# Patient Record
Sex: Female | Born: 1950 | Race: White | Hispanic: No | Smoking: Current every day smoker
Health system: Southern US, Community
[De-identification: ages and names within clinical notes are randomized; demographics above are authoritative.]

## PROBLEM LIST (undated history)

## (undated) DIAGNOSIS — J181 Lobar pneumonia, unspecified organism: Secondary | ICD-10-CM

## (undated) DIAGNOSIS — A419 Sepsis, unspecified organism: Secondary | ICD-10-CM

## (undated) DIAGNOSIS — E559 Vitamin D deficiency, unspecified: Secondary | ICD-10-CM

## (undated) DIAGNOSIS — Z720 Tobacco use: Secondary | ICD-10-CM

## (undated) DIAGNOSIS — J90 Pleural effusion, not elsewhere classified: Secondary | ICD-10-CM

## (undated) DIAGNOSIS — N390 Urinary tract infection, site not specified: Secondary | ICD-10-CM

## (undated) DIAGNOSIS — R7989 Other specified abnormal findings of blood chemistry: Secondary | ICD-10-CM

## (undated) DIAGNOSIS — K5792 Diverticulitis of intestine, part unspecified, without perforation or abscess without bleeding: Secondary | ICD-10-CM

## (undated) DIAGNOSIS — N179 Acute kidney failure, unspecified: Secondary | ICD-10-CM

## (undated) DIAGNOSIS — J9601 Acute respiratory failure with hypoxia: Secondary | ICD-10-CM

## (undated) DIAGNOSIS — F1010 Alcohol abuse, uncomplicated: Secondary | ICD-10-CM

## (undated) DIAGNOSIS — T7840XA Allergy, unspecified, initial encounter: Secondary | ICD-10-CM

## (undated) DIAGNOSIS — I10 Essential (primary) hypertension: Secondary | ICD-10-CM

## (undated) DIAGNOSIS — I471 Supraventricular tachycardia, unspecified: Secondary | ICD-10-CM

## (undated) DIAGNOSIS — H269 Unspecified cataract: Secondary | ICD-10-CM

## (undated) HISTORY — DX: Lobar pneumonia, unspecified organism: J18.1

## (undated) HISTORY — PX: CATARACT EXTRACTION: SUR2

## (undated) HISTORY — PX: ABDOMINAL HYSTERECTOMY: SHX81

## (undated) HISTORY — DX: Other specified abnormal findings of blood chemistry: R79.89

## (undated) HISTORY — DX: Sepsis, unspecified organism: A41.9

## (undated) HISTORY — DX: Diverticulitis of intestine, part unspecified, without perforation or abscess without bleeding: K57.92

## (undated) HISTORY — DX: Pleural effusion, not elsewhere classified: J90

## (undated) HISTORY — DX: Unspecified cataract: H26.9

## (undated) HISTORY — DX: Essential (primary) hypertension: I10

## (undated) HISTORY — DX: Vitamin D deficiency, unspecified: E55.9

## (undated) HISTORY — PX: FRACTURE SURGERY: SHX138

## (undated) HISTORY — DX: Acute kidney failure, unspecified: N17.9

## (undated) HISTORY — DX: Allergy, unspecified, initial encounter: T78.40XA

## (undated) HISTORY — DX: Acute respiratory failure with hypoxia: J96.01

## (undated) HISTORY — DX: Urinary tract infection, site not specified: N39.0

---

## 2004-10-01 ENCOUNTER — Inpatient Hospital Stay (HOSPITAL_COMMUNITY): Admission: EM | Admit: 2004-10-01 | Discharge: 2004-10-05 | Payer: Self-pay | Admitting: Emergency Medicine

## 2004-10-01 ENCOUNTER — Ambulatory Visit: Payer: Self-pay | Admitting: Physical Medicine & Rehabilitation

## 2004-10-05 ENCOUNTER — Inpatient Hospital Stay (HOSPITAL_COMMUNITY)
Admission: RE | Admit: 2004-10-05 | Discharge: 2004-10-11 | Payer: Self-pay | Admitting: Physical Medicine & Rehabilitation

## 2004-10-05 ENCOUNTER — Ambulatory Visit: Payer: Self-pay | Admitting: Physical Medicine & Rehabilitation

## 2005-12-28 ENCOUNTER — Emergency Department (HOSPITAL_COMMUNITY): Admission: EM | Admit: 2005-12-28 | Discharge: 2005-12-28 | Payer: Self-pay | Admitting: Emergency Medicine

## 2006-03-08 ENCOUNTER — Inpatient Hospital Stay (HOSPITAL_COMMUNITY): Admission: EM | Admit: 2006-03-08 | Discharge: 2006-03-13 | Payer: Self-pay | Admitting: *Deleted

## 2008-01-24 ENCOUNTER — Emergency Department (HOSPITAL_COMMUNITY): Admission: EM | Admit: 2008-01-24 | Discharge: 2008-01-25 | Payer: Self-pay | Admitting: Emergency Medicine

## 2009-02-25 ENCOUNTER — Ambulatory Visit: Payer: Self-pay | Admitting: Internal Medicine

## 2010-04-19 ENCOUNTER — Ambulatory Visit: Payer: Self-pay | Admitting: Ophthalmology

## 2010-05-10 ENCOUNTER — Ambulatory Visit: Payer: Self-pay | Admitting: Ophthalmology
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
## 2010-09-22 NOTE — H&P (Signed)
Erica Ball, MYSLIWIEC NO.:  [id number]   MEDICAL RECORD NO.:  [id number]          PATIENT TYPE:  INP   LOCATION:  4735                         FACILITY:  [facility]   PHYSICIAN:  Jackie Plum, M.D.DATE OF BIRTH:  [birth year]   DATE OF ADMISSION:  03/08/2006  DATE OF DISCHARGE:                                HISTORY & PHYSICAL   CHIEF COMPLAINT:  Nausea and vomiting.   HISTORY OF PRESENT ILLNESS:  The patient is a 60 year old Caucasian lady who  presented with a 2-week history of nausea which has been progressively  worsening with difficulty keeping any food down.  She has been vomiting  almost anything that she takes over the last week or so.  She had 2 episodes  of diarrhea 2 days ago.  On account of this problem, she went to see her  primary care physician, whereupon she was noted to be hypotensive.  She was  referred to the ED for further evaluation and management for suspected  dehydration.  In the emergency room, the patient was noted to be severely  hypokalemic and hyponatremic and the hospitalist service was asked to  evaluate for admission.  The patient gives a history of generalized  weakness.  She does not have any fever or chills.  No constipation.  No  dysuria, frequency or micturition.  She was said to be a little bit  lethargic initially at the emergency room.  She gives history of  lightheadedness, especially on standing up, no history of syncopal episode.   PAST MEDICAL HISTORY:  1. Positive for history of hypertension.  2. She also has a history of about 80-pack-year history of cigarette      smoking.  3. She has a history of Colles fracture in August 2007.  4. History of other orthopedic issues.  5. She is status post hysterectomy and tonsillectomy.   FAMILY HISTORY:  Positive for history of hypertension.   ALLERGIES:  The patient does not have any medication allergies.   MEDICATIONS:  She is on Vicodin for pain control and she was on  atenolol,  hydrochlorothiazide and lisinopril, all of which were discontinued by her  doctor on date of admission on account of low blood pressure at the doctor's  office.   SOCIAL HISTORY:  The patient is single.  She smokes 2 packs of cigarettes  daily for about 40 years.  She has 1 older son who lives somewhere near  [place], [place].  She drinks about a 6-pack of beer daily.   REVIEW OF SYSTEMS:  As noted above and otherwise unremarkable.   PHYSICAL EXAMINATION:  VITAL SIGNS:  BP was 97/76 at the ED initially, but  it came up to 105/69.  Pulse 85.  Respirations 18.  Temperature 97.4 degrees  Fahrenheit.  O2 saturation was 97% on room air.  GENERAL:  The patient was generally weak-looking, but she was not in acute  cardiopulmonary distress.  HEENT:  Normocephalic, atraumatic.  Pupils were equal, round and reactive to  light.  Extraocular movements intact.  Oropharynx was slightly dry.  No  exudation or erythema noted.  NECK:  Supple with no JVD.  LUNGS:  Vesicular breath sounds, no crackles or wheezes.  CARDIAC:  Regular rate and rhythm.  No gallops or murmur.  ABDOMEN:  Soft and nontender.  Bowel sounds present.  EXTREMITIES:  No cyanosis.  No edema.  The patient had an orthopedic cast on  the left forearm.   LABORATORY WORK:  Twelve-lead EKG shows sinus bradycardia at 59 beats per  minute.  There was a prolonged Q-T interval.  There was no __________  of  any ischemic changes.   X-ray of the chest showed hyperinflation with airway thickening suspicious  for possible COPD.   WBC 6.7, hemoglobin 15.6, hematocrit 43.9, MCV 105.6, platelet count  181,000.  Sodium 114, potassium less than 2, chloride less than 70, CO2 29,  glucose 125, BUN 6, creatinine 0.9, total bilirubin 0.8, alkaline  phosphatase 77, AST 74, ALT 99.  Total protein 6.4, albumin 3.8, calcium  9.0.   IMPRESSIONS:  1. Severe electrolyte abnormality with severe hypokalemia and      hyponatremia.  She  has had loss of potassium from gastrointestinal      tract with nausea and vomiting and decreased intake of potassium.  2. Alcohol abuse.  By history, the patient's last intake of alcohol was      less than a week ago, according to the patient.  3. Continued cigarette smoking with about a 80-pack-year history.  4. Transaminitis in a patient with alcohol abuse, rule out alcoholic liver      disease.  5. Mild dehydration from decreased oral intake as well as gastrointestinal      loss of fluids from nausea and vomiting.  The patient is admitted for      aggressive potassium repletion.  She has an abnormal EKG with prolonged      Q-T interval due to the hypokalemia and we will check a TSH level and      urine studies, that is, urine electrolytes, to further evaluate her      hyponatremia.  The patient denies any prior history of hyponatremia.      This may also be related to her cigarette smoking with syndrome of      inappropriate antidiuretic hormone.  She may also have chronic      obstructive pulmonary disease which in turn may also cause syndrome of      inappropriate antidiuretic hormone.  We will repeat potassium level.      We will start her on saline infusion.  We will give her some      antiemetics and antinauseants for symptom control.  CT scan of the head      has been order and is also pending and will need to be followed up.  6. The patient will be admitted to a telemetry bed for monitoring and      repeat EKG in the morning.  Further recommendations will be made as      data base responds.      Jackie Plum, M.D.  Electronically Signed     GO/MEDQ  D:  03/09/2006  T:  03/09/2006  Job:  [id number]
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
## 2010-09-22 NOTE — Discharge Summary (Signed)
NAME:  Erica Ball, Erica Ball             ACCOUNT NO.:  [id number]   MEDICAL RECORD NO.:  [id number]          PATIENT TYPE:  INP   LOCATION:  4735                         FACILITY:  [facility]   PHYSICIAN:  Erica Ball, MDDATE OF BIRTH:  [birth year]   DATE OF ADMISSION:  03/08/2006  DATE OF DISCHARGE:  03/13/2006                                 DISCHARGE SUMMARY   DISCHARGE DIAGNOSES:  1. Vomiting, resolved. Suspect alcoholic gastritis.  2. Hypokalemia secondary to vomiting, alcohol, and hydrochlorothiazide.  3. Hyponatremia secondary to vomiting and alcohol.  4. History of hypertension.  5. Tobacco abuse, counseled against.  6. Deconditioning.  7. Alcohol abuse.   DISCHARGE MEDICATIONS:  1. She is to stop her atenolol, hydrochlorothiazide, and lisinopril.  2. She is to take Prilosec 40 mg a day.  3. Phenergan 12.5 mg p.o. q.6 h. p.r.n. nausea.  4. Continue Vicodin as needed.  Diet regular.  She is encouraged to      abstain from alcohol and  cigarettes.   CONDITION:  Stable.   ACTIVITY:  She is being sent home with home physical therapy.   FOLLOWUP:  With primary care physician in a week, at which time blood  pressure and a basic metabolic panel should be checked.   CONSULTATIONS:  None.   PROCEDURES:  None.   PERTINENT LABORATORY DATA:  CBC on admission was essentially unremarkable,  except for elevated hemoglobin and hematocrit, and an MCV of 105.  Sodium on  admission was 114, at discharge is 131. Potassium on admission was less than  2, at discharge is 3.6.  Liver function tests significant for an AST of 74,  ALT of 99.  Serum osmolarity was low at 233.  Urine osmolarity 169.  TSH  1.031.  Urine sodium 13.   SPECIAL STUDIES AND RADIOLOGY:  EKG showed sinus bradycardia, with a rate of  59, ST and T-wave abnormalities inferolaterally, prolonged QT. Abdominal  ultrasound was normal.   HISTORY AND HOSPITAL COURSE:  Erica Ball is a 60 year old white female  patient of  [facility] would been seen by Celso Amy and Dr.  Joycelyn Ball the day of admission. She had been vomiting on and off for a  week or two.  She also had one to two episodes of diarrhea.  She admits to  drinking a six-pack of beer a day and is usually on several medications for  hypertension. She was noted to be hypotensive in the office and was sent to  the emergency room. She was found to be profoundly hyponatremic and  hypokalemic.  Her blood pressure in the emergency room was marginal, in the  low 90s, but apparently she dropped to 78 briefly on the floor.  She was  given saline and potassium repletion as well as antiemetics.  Her vomiting  improved, and at the time of discharge she was tolerating a diet.  Her  antihypertensives of course were held, and I have instructed her not to take  them. She may eventually be able to go back on them when her blood pressure  comes, but I would avoid the  hydrochlorothiazide, given her hypokalemia.  I  suspect most much of her vomiting and electrolyte abnormalities are alcohol  related, and I have asked her to abstain from alcohol.  She had a physical  therapy consult while here was who recommended home physical therapy, which  I have ordered.  I suspect she has an element of gastritis, and I have  placed her on a proton pump inhibitor empirically.      Erica L. Lendell Caprice, MD  Electronically Signed     CLS/MEDQ  D:  03/13/2006  T:  03/13/2006  Job:  [id number]   cc:   Erica Ball, M.D.

## 2010-09-22 NOTE — Consult Note (Signed)
NAMEMURRAY, GUZZETTA NO.:  [id number]   MEDICAL RECORD NO.:  [id number]          PATIENT TYPE:  EMS   LOCATION:  MAJO                         FACILITY:  [facility]   PHYSICIAN:  Doralee Albino. Carola Frost, M.D. DATE OF BIRTH:  12-Oct-1950   DATE OF CONSULTATION:  12/28/2005  DATE OF DISCHARGE:  12/28/2005                                   CONSULTATION   REASON FOR CONSULTATION:  Left wrist pain and deformity.   REQUESTING PHYSICIAN:  Lorre Nick, M.D.   BRIEF HISTORY OF PRESENTATION:  Erica Ball is a 60 year old right-hand-  dominant female previously known to me for an ankle fracture.  She sustained  a ground-level fall onto her left wrist approximately 2 days ago and  sustained pain and deformity.  She subsequently presented when her symptoms  did not resolve.  She denies any numbness or tingling in her fingers and  denies other injury.  There was no break in the skin and bleeding.   PAST MEDICAL HISTORY:  Is notable for longstanding tobacco abuse, smoking a  pack and a half daily.  She also has past medical history notable for  alcohol abuse and the patient has continues to drink regularly.  She works  as a Catering manager.  She has also sustained previous fractures including the  ankle status post ORIF as well as closed treatment of a right wrist  fracture.   MEDICATIONS:  None.   ALLERGIES:  None.   SOCIAL HISTORY:  As above.  The patient presents with her sister.   PHYSICAL EXAMINATION:  She appears older then stated age.  She is not in any  acute distress.  She is overall thin appearance.  The left upper extremity  is notable for intact radial, median and ulnar sensation.  Radial pulses 2+.  She does have a wrist deformity and isolated tenderness.  No elbow  tenderness or discomfort.   X-RAYS:  AP and lateral films of the wrist and hand demonstrate a comminuted  extra-articular distal radius fracture with greater than a centimeter  shortening.   ASSESSMENT:   Colles fracture left wrist in a patient with pack and half a  smoking history and other previous fractures.   PLAN:  I discussed with Erica Ball the markers on her current x-rays that  make it likely she will have a poor result with nonoperative management, and  that it is likely we will be unable to obtain and maintain a near-anatomic  reduction.  This could result in deformity and decreased function both in  the near and the long-term.  She understood this quite clearly and in spite  of that did not wish to undergo operative treatment.  She understood that  operative treatment, though, would lead to better overall alignment, did  carry of risk of infection, and that with either treatment there is a  possibility this may take a long period of time to heal.  We did discuss  smoking cessation and reduction as well and she was not interested in that.   INTERVENTION:  After discussion of a hematoma block and manipulation, the  patient  wished to proceed.  This was performed in standard fashion using 8  mL of a mixture of lidocaine and Marcaine.  A well-molded sugar-tong splint  was applied and then postreduction x-rays obtained.  These show improvement  in her overall alignment without restoration of appropriate tilt.  There was  improvement in the length.   PLAN:  Ms. Scherzinger will need to be followed carefully to re-manipulate as  necessary to obtain the best possible result with closed reduction.  However, given the initial comminution, poor bone quality and excessive  shortening on presentation, it is unlikely that we will be able to obtain or  maintain a great result.  It is certainly her choice and will do everything  possible to get the best possible outcome given the given the parameters  allowed for treatment.  She was placed into a sling and will return to  clinic for AP and lateral x-rays of the left wrist within 10 days.      Doralee Albino. Carola Frost, M.D.  Electronically  Signed     MHH/MEDQ  D:  12/31/2005  T:  12/31/2005  Job:  [id number]
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
## 2011-02-05 LAB — DIFFERENTIAL
Basophils Absolute: 0
Eosinophils Absolute: 0.2
Eosinophils Relative: 1
Lymphocytes Relative: 6 — ABNORMAL LOW
Monocytes Absolute: 0.5

## 2011-02-05 LAB — CBC
MCV: 108 — ABNORMAL HIGH
Platelets: 365
RBC: 4.8
WBC: 13.3 — ABNORMAL HIGH

## 2011-02-05 LAB — COMPREHENSIVE METABOLIC PANEL
ALT: 14
AST: 21
Albumin: 4.4
Chloride: 96
Creatinine, Ser: 0.85
GFR calc Af Amer: >60
Potassium: 2.8 — ABNORMAL LOW
Sodium: 140
Total Bilirubin: 0.6

## 2011-02-05 LAB — URINALYSIS, ROUTINE W REFLEX MICROSCOPIC
Bilirubin Urine: NEGATIVE
Glucose, UA: NEGATIVE
Ketones, ur: NEGATIVE
Nitrite: NEGATIVE
pH: 7

## 2011-02-05 LAB — URINE MICROSCOPIC-ADD ON

## 2011-10-11 ENCOUNTER — Ambulatory Visit: Payer: BC Managed Care – PPO

## 2011-10-11 ENCOUNTER — Ambulatory Visit (INDEPENDENT_AMBULATORY_CARE_PROVIDER_SITE_OTHER): Payer: BC Managed Care – PPO | Admitting: Family Medicine

## 2011-10-11 VITALS — BP 215/114 | HR 94 | Temp 98.1°F | Resp 18 | Ht 67.0 in | Wt 167.4 lb

## 2011-10-11 DIAGNOSIS — M25569 Pain in unspecified knee: Secondary | ICD-10-CM

## 2011-10-11 DIAGNOSIS — M25559 Pain in unspecified hip: Secondary | ICD-10-CM

## 2011-10-11 DIAGNOSIS — W19XXXA Unspecified fall, initial encounter: Secondary | ICD-10-CM

## 2011-10-11 DIAGNOSIS — M25562 Pain in left knee: Secondary | ICD-10-CM

## 2011-10-11 DIAGNOSIS — R3915 Urgency of urination: Secondary | ICD-10-CM

## 2011-10-11 DIAGNOSIS — F1021 Alcohol dependence, in remission: Secondary | ICD-10-CM

## 2011-10-11 DIAGNOSIS — M25552 Pain in left hip: Secondary | ICD-10-CM

## 2011-10-11 DIAGNOSIS — I10 Essential (primary) hypertension: Secondary | ICD-10-CM

## 2011-10-11 DIAGNOSIS — F411 Generalized anxiety disorder: Secondary | ICD-10-CM

## 2011-10-11 DIAGNOSIS — R319 Hematuria, unspecified: Secondary | ICD-10-CM

## 2011-10-11 DIAGNOSIS — F419 Anxiety disorder, unspecified: Secondary | ICD-10-CM

## 2011-10-11 DIAGNOSIS — M810 Age-related osteoporosis without current pathological fracture: Secondary | ICD-10-CM

## 2011-10-11 LAB — POCT CBC
Granulocyte percent: 81.5 %G — AB (ref 37–80)
HCT, POC: 44.5 % (ref 37.7–47.9)
Hemoglobin: 14.9 g/dL (ref 12.2–16.2)
Lymph, poc: 1.7 (ref 0.6–3.4)
MCH, POC: 35.1 pg — AB (ref 27–31.2)
MCHC: 33.5 g/dL (ref 31.8–35.4)
MCV: 105 fL — AB (ref 80–97)
MID (cbc): 0.5 (ref 0–0.9)
MPV: 8.1 fL (ref 0–99.8)
POC Granulocyte: 9.3 — AB (ref 2–6.9)
POC LYMPH PERCENT: 14.5 %L (ref 10–50)
POC MID %: 4 %M (ref 0–12)
Platelet Count, POC: 294 10*3/uL (ref 142–424)
RBC: 4.24 M/uL (ref 4.04–5.48)
RDW, POC: 13.4 %
WBC: 11.4 10*3/uL — AB (ref 4.6–10.2)

## 2011-10-11 LAB — POCT URINALYSIS DIPSTICK
Bilirubin, UA: NEGATIVE
Glucose, UA: NEGATIVE
Nitrite, UA: NEGATIVE

## 2011-10-11 LAB — LIPID PANEL
Cholesterol: 167 mg/dL (ref 0–200)
HDL: 62 mg/dL (ref >39)
LDL Cholesterol: 87 mg/dL (ref 0–99)
Total CHOL/HDL Ratio: 2.7 Ratio
Triglycerides: 91 mg/dL (ref <150)
VLDL: 18 mg/dL (ref 0–40)

## 2011-10-11 LAB — COMPREHENSIVE METABOLIC PANEL
ALT: 13 U/L (ref 0–35)
AST: 14 U/L (ref 0–37)
Albumin: 4.5 g/dL (ref 3.5–5.2)
Alkaline Phosphatase: 67 U/L (ref 39–117)
BUN: 9 mg/dL (ref 6–23)
CO2: 29 mEq/L (ref 19–32)
Calcium: 9.4 mg/dL (ref 8.4–10.5)
Chloride: 100 mEq/L (ref 96–112)
Creat: 0.51 mg/dL (ref 0.50–1.10)
Glucose, Bld: 99 mg/dL (ref 70–99)
Potassium: 4.4 mEq/L (ref 3.5–5.3)
Sodium: 139 mEq/L (ref 135–145)
Total Bilirubin: 0.5 mg/dL (ref 0.3–1.2)
Total Protein: 6.9 g/dL (ref 6.0–8.3)

## 2011-10-11 LAB — POCT UA - MICROSCOPIC ONLY

## 2011-10-11 LAB — TSH: TSH: 1.867 u[IU]/mL (ref 0.350–4.500)

## 2011-10-11 MED ORDER — AMLODIPINE BESYLATE 10 MG PO TABS: 10.0000 mg | ORAL_TABLET | Freq: Every day | ORAL | Status: DC

## 2011-10-11 MED ORDER — LORAZEPAM 0.5 MG PO TABS: 0.5000 mg | ORAL_TABLET | Freq: Every day | ORAL | Status: AC | PRN

## 2011-10-11 MED ORDER — TRAMADOL HCL 50 MG PO TABS: 50.0000 mg | ORAL_TABLET | Freq: Three times a day (TID) | ORAL | Status: AC | PRN

## 2011-10-11 NOTE — Patient Instructions (Signed)
Home Safety and Preventing Falls Falls are a leading cause of injury and while they affect all age groups, falls have greater short-term and long-term impact on older age groups. However, falls should not be a part of life or aging. It is possible for individuals and their families to use preventive measures to significantly decrease the likelihood that anyone, especially an older adult, will fall. There are many simple measures which can make your home safer with respect to preventing falls. The following actions can help reduce falls among all members of your family and are especially important as you age, when your balance, lower limb strength, coordination, and eyesight may be declining. The use of preventive measures will help to reduce you and your family's risk of falls and serious medical consequences. OUTDOORS  Repair cracks and edges of walkways and driveways.   Remove high doorway thresholds and trim shrubbery on the main path into your home.   Ensure there is good outside lighting at main entrances and along main walkways.   Clear walkways of tools, rocks, debris, and clutter.   Check that handrails are not broken and are securely fastened. Both sides of steps should have handrails.   In the garage, be attentive to and clean up grease or oil spills on the cement. This can make the surface extremely slippery.   In winter, have leaves, snow, and ice cleared regularly.   Use sand or salt on walkways during winter months.  BATHROOM  Install grab bars by the toilet and in the tub and shower.   Use non-skid mats or decals in the tub or shower.   If unable to easily stand unsupported while showering, place a plastic non slip stool in the shower to sit on when needed.   Install night lights.   Keep floors dry and clean up all water on the floor immediately.   Remove soap buildup in tub or shower on a regular basis.   Secure bath mats with non-slip, double-sided rug tape.    Remove tripping hazards from the floors.  BEDROOMS  Install night lights.   Do not use oversized bedding.   Make sure a bedside light is easy to reach.   Keep a telephone by your bedside.   Make sure that you can get in and out of your bed easily.   Have a firm chair, with side arms, to use for getting dressed.   Remove clutter from around closets.   Store clothing, bed coverings, and other household items where you can reach them comfortably.   Remove tripping hazards from the floor.  LIVING AREAS AND STAIRWAYS  Turn on lights to avoid having to walk through dark areas.   Keep lighting uniform in each room. Place brighter lightbulbs in darker areas, including stairways.   Replace lightbulbs that burn out in stairways immediately.   Arrange furniture to provide for clear pathways.   Keep furniture in the same place.   Eliminate or tape down electrical cables in high traffic areas.   Place handrails on both sides of stairways. Use handrails when going up or down stairs.   Most falls occur on the top or bottom 3 steps.   Fix any loose handrails. Make sure handrails on both sides of the stairways are as long as the stairs.   Remove all walkway obstacles.   Coil or tape electrical cords off to the side of walking areas and out of the way. If using many extension cords, have an electrician   put in a new wall outlet to reduce or eliminate them.   Make sure spills are cleaned up quickly and allow time for drying before walking on freshly cleaned floors.   Firmly attach carpet with non-skid or two-sided tape.   Keep frequently used items within easy reach.   Remove tripping hazards such as throw rugs and clutter in walkways. Never leave objects on stairs.   Get rid of throw rugs elsewhere if possible.   Eliminate uneven floor surfaces.   Make sure couches and chairs are easy to get into and out of.   Check carpeting to make sure it is firmly attached along stairs.    Make repairs to worn or loose carpet promptly.   Select a carpet pattern that does not visually hide the edge of steps.   Avoid placing throw rugs or scatter rugs at the top or bottom of stairways, or properly secure with carpet tape to prevent slippage.   Have an electrician put in a light switch at the top and bottom of the stairs.   Get light switches that glow.   Avoid the following practices: hurrying, inattention, obscured vision, carrying large loads, and wearing slip-on shoes.   Be aware of all pets.  KITCHEN  Place items that are used frequently, such as dishes and food, within easy reach.   Keep handles on pots and pans toward the center of the stove. Use back burners when possible.   Make sure spills are cleaned up quickly and allow time for drying.   Avoid walking on wet floors.   Avoid hot utensils and knives.   Position shelves so they are not too high or low.   Place commonly used objects within easy reach.   If necessary, use a sturdy step stool with a grab bar when reaching.   Make sure electrical cables are out of the way.   Do not use floor polish or wax that makes floors slippery.  OTHER HOME FALL PREVENTION STRATEGIES  Wear low heel or rubber sole shoes that are supportive and fit well.   Wear closed toe shoes.   Know and watch for side effects of medications. Have your caregiver or pharmacist look at all your medicines, even over-the-counter medicines. Some medicines can make you sleepy or dizzy.   Exercise regularly. Exercise makes you stronger and improves your balance and coordination.   Limit use of alcohol.   Use eyeglasses if necessary and keep them clean. Have your vision checked every year.   Organize your household in a manner that minimizes the need to walk distances when hurried, or go up and down stairs unnecessarily. For example, have a phone placed on at least each floor of your home. If possible, have a phone beside each sitting  or lying area where you spend the most time at home. Keep emergency numbers posted at all phones.   Use non-skid floor wax.   When using a ladder, make sure:   The base is firm.   All ladder feet are on level ground.   The ladder is angled against the wall properly.   When climbing a ladder, face the ladder and hold the ladder rungs firmly.   If reaching, always keep your hips and body weight centered between the rails.   When using a stepladder, make sure it is fully opened and both spreaders are firmly locked.   Do not climb a closed stepladder.   Avoid climbing beyond the second step from the top   of a stepladder and the 4th rung from the top of an extension ladder.   Learn and use mobility aids as needed.   Change positions slowly. Arise slowly from sitting and lying positions. Sit on the edge of your bed before getting to your feet.   If you have a history of falls, ask someone to add color or contrast paint or tape to grab bars and handrails in your home.   If you have a history of falls, ask someone to place contrasting color strips on first and last steps.   Install an electrical emergency response system if you need one, and know how to use it.   If you have a medical or other condition that causes you to have limited physical strength, it is important that you reach out to family and friends for occasional help.  FOR CHILDREN:  If young children are in the home, use safety gates. At the top of stairs use screw-mounted gates; use pressure-mounted gates for the bottom of the stairs and doorways between rooms.   Young children should be taught to descend stairs on their stomachs, feet first, and later using the handrail.   Keep drawers fully closed to prevent them from being climbed on or pulled out entirely.   Move chairs, cribs, beds and other furniture away from windows.   Consider installing window guards on windows ground floor and up, unless they are emergency  fire exits. Make sure they have easy release mechanisms.   Consider installing special locks that only allow the window to be opened to a certain height.   Never rely on window screens to prevent falls.   Never leave babies alone on changing tables, beds or sofas. Use a changing table that has a restraining strap.   When a child can pull to a standing position, the crib mattress should be adjusted to its lowest position. There should be at least 26 inches between the top rails of the crib drop side and the mattress. Toys, bumper pads, and other objects that can be used as steps to climb out should be removed from the crib.   On bunk beds never allow a child under age 6 to sleep on the top bunk. For older children, if the upper bunk is not against a wall, use guard rails on both sides. No matter how old a child is, keep the guard rails in place on the top bunk since children roll during sleep. Do not permit horseplay on bunks.   Grass and soil surfaces beneath backyard playground equipment should be replaced with hardwood chips, shredded wood mulch, sand, pea gravel, rubber, crushed stone, or another safer material at depths of at least 9 to 12 inches.   When riding bikes or using skates, skateboards, skis, or snowboards, require children to wear helmets. Look for those that have stickers stating that they meet or exceed safety standards.   Vertical posts or pickets in deck, balcony, and stairway railings should be no more than 3 1/2 inches apart if a young baby will have access to the area. The space between horizontal rails or bars, and between the floor and the first horizontal rail or bar, should be no more than 3 1/2 inches.  Document Released: 04/13/2002 Document Revised: 04/12/2011 Document Reviewed: 02/10/2009 [employer] Patient Information 2012 [employer]. 
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
## 2011-10-12 ENCOUNTER — Telehealth: Payer: Self-pay | Admitting: *Deleted

## 2011-10-12 ENCOUNTER — Other Ambulatory Visit: Payer: Self-pay | Admitting: Family Medicine

## 2011-10-12 LAB — VITAMIN D 25 HYDROXY (VIT D DEFICIENCY, FRACTURES): Vit D, 25-Hydroxy: 15 ng/mL — ABNORMAL LOW (ref 30–89)

## 2011-10-12 NOTE — Telephone Encounter (Signed)
Per Dr. Milus Glazier, told patient Vitamin D level low.  Dr prescribed Vitamin D 50,000 IU called patient, called rx to [facility], told patient to recheck vit D level end of summer.  Angie Kortez Murtagh, CMA.

## 2011-10-30 ENCOUNTER — Ambulatory Visit: Payer: BC Managed Care – PPO

## 2011-10-30 ENCOUNTER — Ambulatory Visit (INDEPENDENT_AMBULATORY_CARE_PROVIDER_SITE_OTHER): Payer: BC Managed Care – PPO | Admitting: Family Medicine

## 2011-10-30 VITALS — BP 154/88 | HR 97 | Temp 99.3°F | Resp 16 | Ht 66.5 in | Wt 166.0 lb

## 2011-10-30 DIAGNOSIS — I10 Essential (primary) hypertension: Secondary | ICD-10-CM | POA: Insufficient documentation

## 2011-10-30 DIAGNOSIS — E559 Vitamin D deficiency, unspecified: Secondary | ICD-10-CM

## 2011-10-30 DIAGNOSIS — R05 Cough: Secondary | ICD-10-CM

## 2011-10-30 DIAGNOSIS — R059 Cough, unspecified: Secondary | ICD-10-CM

## 2011-10-30 DIAGNOSIS — K5792 Diverticulitis of intestine, part unspecified, without perforation or abscess without bleeding: Secondary | ICD-10-CM

## 2011-10-30 DIAGNOSIS — Z0000 Encounter for general adult medical examination without abnormal findings: Secondary | ICD-10-CM

## 2011-10-30 HISTORY — DX: Vitamin D deficiency, unspecified: E55.9

## 2011-10-30 HISTORY — DX: Diverticulitis of intestine, part unspecified, without perforation or abscess without bleeding: K57.92

## 2011-10-30 LAB — IFOBT (OCCULT BLOOD): IFOBT: POSITIVE

## 2011-10-30 NOTE — Progress Notes (Signed)
@[facility] @  Patient ID: Erica Ball MRN: [id number], DOB: 1950-06-11, 61 y.o. Date of Encounter: 10/30/2011, 3:06 PM  Primary Physician: No primary provider on file.  Chief Complaint: Physical (CPE)  HPI: 61 y.o. y/o female with history of noted below here for CPE.  Doing well. Complains of chronic cough. Never had zostavax LMP: years ago Pap:  Years ago MMG: 4-5 years ago TDap: unsure Colonoscopy:  2 years ago Review of Systems: Consitutional: No fever, chills, fatigue, night sweats, lymphadenopathy, or weight changes. Eyes: No visual changes, eye redness, or discharge. ENT/Mouth: Ears: No otalgia, tinnitus, hearing loss, discharge. Nose: No congestion, rhinorrhea, sinus pain, or epistaxis. Throat: No sore throat, post nasal drip, or teeth pain. Cardiovascular: No CP, palpitations, diaphoresis, DOE, edema, orthopnea, PND. Respiratory: No hemoptysis, SOB, or wheezing.  C/o persisting dry cough since starting norvasc Gastrointestinal: No anorexia, dysphagia, reflux, nausea, vomiting, hematemesis, diarrhea, constipation, BRBPR, or melena. She does note intermittent bilateral crampy abd pain she believes is diverticulitis Breast: No discharge, pain, swelling, or mass. Genitourinary: No dysuria, frequency, urgency, hematuria, incontinence, nocturia, amenorrhea, vaginal discharge, pruritis, burning, abnormal bleeding, or pain.s/p hysterectomy Musculoskeletal: No decreased ROM, myalgias, stiffness, joint swelling, or weakness. Skin: No rash, erythema, lesion changes, pain, warmth, jaundice, or pruritis. Neurological: No headache, dizziness, syncope, seizures, tremors, memory loss, coordination problems, or paresthesias. Psychological: No anxiety, depression, hallucinations, SI/HI. Endocrine: No fatigue, polydipsia, polyphagia, polyuria, or known diabetes. All other systems were reviewed and are otherwise negative.  Past Medical History  Diagnosis Date  . Hypertension   . Allergy      Past Surgical History  Procedure Date  . Fracture surgery   . Abdominal hysterectomy   . Cataract extraction     Home Meds:  Prior to Admission medications   Medication Sig Start Date End Date Taking? Authorizing Provider  amLODipine (NORVASC) 10 MG tablet Take 1 tablet (10 mg total) by mouth daily. 10/11/11 10/10/12 Yes Elvina Sidle, MD    Allergies: Not on File  History   Social History  . Marital Status: Divorced    Spouse Name: N/A    Number of Children: N/A  . Years of Education: N/A   Occupational History  . Not on file.   Social History Main Topics  . Smoking status: Current Everyday Smoker  . Smokeless tobacco: Not on file  . Alcohol Use: Not on file  . Drug Use: Not on file  . Sexually Active: Not on file   Other Topics Concern  . Not on file   Social History Narrative  . No narrative on file    Family History  Problem Relation Age of Onset  . Hypertension Mother   . Heart disease Mother   . Hypertension Father   . Heart disease Father   . Stroke Brother   . Hypertension Brother   . Heart disease Brother     Physical Exam: Blood pressure 154/88, pulse 97, temperature 99.3 F (37.4 C), temperature source Oral, resp. rate 16, height 5' 6.5" (1.689 m), weight 166 lb (75.297 kg)., Body mass index is 26.39 kg/(m^2). General: Well developed, well nourished, in no acute distress. HEENT: Normocephalic, atraumatic. Conjunctiva pink, sclera non-icteric. Pupils 2 mm constricting to 1 mm, round, regular, and equally reactive to light and accomodation. EOMI. Internal auditory canal clear. TMs with good cone of light and without pathology. Nasal mucosa pink. Nares are without discharge. No sinus tenderness. Oral mucosa pink. Dentition horrible-has phone number of dentist and she has insurance now. Pharynx without  exudate. Raspy voice  Neck: Supple. Trachea midline. No thyromegaly. Full ROM. No lymphadenopathy. Lungs: Clear to auscultation bilaterally without  wheezes, rales, or rhonchi. Breathing is of normal effort and unlabored. Cardiovascular: RRR with S1 S2. No murmurs, rubs, or gallops appreciated. Distal pulses 2+ symmetrically. No carotid or abdominal bruits. Breast: Symmetrical. No masses. Nipples without discharge. Abdomen: Soft, non-tender, non-distended with normoactive bowel sounds. No hepatosplenomegaly or masses. No rebound/guarding. No CVA tenderness. Without hernias. Rectal negative Genitourinary:  S/P hysterectomy Musculoskeletal: Full range of motion and 5/5 strength throughout. Without swelling, atrophy, tenderness, crepitus, or warmth. Extremities without clubbing, cyanosis. She does have 1+ bilateral foot edema Calves supple. Skin: Warm and moist without erythema, wounds, or rash. Fading ecchymosis left knee. Neuro: A+Ox3. CN II-XII grossly intact. Moves all extremities spontaneously. Full sensation throughout. Normal gait. DTR 2+ throughout upper and lower extremities. Finger to nose intact. Psych:  Responds to questions appropriately with a normal affect.   Studies:  Results for orders placed in visit on 10/11/11  POCT URINALYSIS DIPSTICK      Component Value Range   Color, UA yellow     Clarity, UA clear     Glucose, UA neg     Bilirubin, UA neg     Ketones, UA trace     Spec Grav, UA 1.010     Blood, UA moderate     pH, UA 6.5     Protein, UA neg     Urobilinogen, UA 0.2     Nitrite, UA neg     Leukocytes, UA Trace    POCT UA - MICROSCOPIC ONLY      Component Value Range   WBC, Ur, HPF, POC 0-1     RBC, urine, microscopic 0-4     Bacteria, U Microscopic trace     Mucus, UA neg     Epithelial cells, urine per micros 0-4     Crystals, Ur, HPF, POC neg     Casts, Ur, LPF, POC neg     Yeast, UA neg    VITAMIN D 25 HYDROXY      Component Value Range   Vit D, 25-Hydroxy 15 (*) 30 - 89 ng/mL  COMPREHENSIVE METABOLIC PANEL      Component Value Range   Sodium 139  135 - 145 mEq/L   Potassium 4.4  3.5 - 5.3 mEq/L     Chloride 100  96 - 112 mEq/L   CO2 29  19 - 32 mEq/L   Glucose, Bld 99  70 - 99 mg/dL   BUN 9  6 - 23 mg/dL   Creat 4.54  0.98 - 1.19 mg/dL   Total Bilirubin 0.5  0.3 - 1.2 mg/dL   Alkaline Phosphatase 67  39 - 117 U/L   AST 14  0 - 37 U/L   ALT 13  0 - 35 U/L   Total Protein 6.9  6.0 - 8.3 g/dL   Albumin 4.5  3.5 - 5.2 g/dL   Calcium 9.4  8.4 - 14.7 mg/dL  POCT CBC      Component Value Range   WBC 11.4 (*) 4.6 - 10.2 K/uL   Lymph, poc 1.7  0.6 - 3.4   POC LYMPH PERCENT 14.5  10 - 50 %L   MID (cbc) 0.5  0 - 0.9   POC MID % 4.0  0 - 12 %M   POC Granulocyte 9.3 (*) 2 - 6.9   Granulocyte percent 81.5 (*) 37 - 80 %G  RBC 4.24  4.04 - 5.48 M/uL   Hemoglobin 14.9  12.2 - 16.2 g/dL   HCT, POC 16.1  09.6 - 47.9 %   MCV 105.0 (*) 80 - 97 fL   MCH, POC 35.1 (*) 27 - 31.2 pg   MCHC 33.5  31.8 - 35.4 g/dL   RDW, POC 04.5     Platelet Count, POC 294  142 - 424 K/uL   MPV 8.1  0 - 99.8 fL  LIPID PANEL      Component Value Range   Cholesterol 167  0 - 200 mg/dL   Triglycerides 91  <409 mg/dL   HDL 62  >81 mg/dL   Total CHOL/HDL Ratio 2.7     VLDL 18  0 - 40 mg/dL   LDL Cholesterol 87  0 - 99 mg/dL  TSH      Component Value Range   TSH 1.867  0.350 - 4.500 uIU/mL   [facility] reading (PRIMARY) by  Dr. Milus Glazier: c/w COPD with flattened diaphragms. EKG:  Normal tracing  Assessment/Plan:  61 y.o. y/o female here for CPE.  She smokes, has poor dental condition, and generally appears to be in only health.  She has the chronic cough which she attributes to the Norvasc, but doesn't want to buy new medicine until she used this up. She does have diffuse abdominal tenderness without any mass, and I think this is a chronic problem - I have encouraged her to follow up with the dentist, to reduce or quit smoking, and get a Zostavax and TDaP (patient refused TDaP, pneumovax today)  Signed, Elvina Sidle, MD 10/30/2011 3:06 PM  Results for orders placed in visit on 10/30/11  IFOBT (OCCULT  BLOOD)      Component Value Range   IFOBT Positive     Since patient had colonoscopy just 2 years ago, I will consider this related to IBS and diverticulosis.

## 2012-05-20 ENCOUNTER — Other Ambulatory Visit: Payer: Self-pay | Admitting: Family Medicine

## 2012-05-21 NOTE — Telephone Encounter (Signed)
Needs appointment

## 2012-07-01 ENCOUNTER — Ambulatory Visit (INDEPENDENT_AMBULATORY_CARE_PROVIDER_SITE_OTHER): Payer: BC Managed Care – PPO | Admitting: Emergency Medicine

## 2012-07-01 VITALS — BP 145/79 | HR 97 | Temp 98.1°F | Resp 16 | Ht 66.5 in | Wt 160.6 lb

## 2012-07-01 DIAGNOSIS — I10 Essential (primary) hypertension: Secondary | ICD-10-CM

## 2012-07-01 DIAGNOSIS — R1032 Left lower quadrant pain: Secondary | ICD-10-CM

## 2012-07-01 DIAGNOSIS — K5732 Diverticulitis of large intestine without perforation or abscess without bleeding: Secondary | ICD-10-CM

## 2012-07-01 LAB — POCT CBC
Granulocyte percent: 78.9 %G (ref 37–80)
Hemoglobin: 15.6 g/dL (ref 12.2–16.2)
MID (cbc): 0.5 (ref 0–0.9)
MPV: 7.9 fL (ref 0–99.8)
POC Granulocyte: 8.5 — AB (ref 2–6.9)
POC MID %: 4.8 %M (ref 0–12)
Platelet Count, POC: 366 10*3/uL (ref 142–424)
RBC: 4.71 M/uL (ref 4.04–5.48)

## 2012-07-01 MED ORDER — TRAMADOL HCL 50 MG PO TABS: 50.0000 mg | ORAL_TABLET | Freq: Three times a day (TID) | ORAL | Status: DC | PRN

## 2012-07-01 MED ORDER — ERGOCALCIFEROL 1.25 MG (50000 UT) PO CAPS: 50000.0000 [IU] | ORAL_CAPSULE | ORAL | Status: DC

## 2012-07-01 MED ORDER — LORAZEPAM 0.5 MG PO TABS: 0.5000 mg | ORAL_TABLET | Freq: Three times a day (TID) | ORAL | Status: DC

## 2012-07-01 MED ORDER — CIPROFLOXACIN HCL 500 MG PO TABS: 500.0000 mg | ORAL_TABLET | Freq: Two times a day (BID) | ORAL | Status: DC

## 2012-07-01 MED ORDER — AMLODIPINE BESYLATE 10 MG PO TABS: 10.0000 mg | ORAL_TABLET | Freq: Every day | ORAL | Status: DC

## 2012-07-01 MED ORDER — METRONIDAZOLE 500 MG PO TABS: 500.0000 mg | ORAL_TABLET | Freq: Three times a day (TID) | ORAL | Status: DC

## 2012-07-01 NOTE — Patient Instructions (Addendum)
Diverticulitis °A diverticulum is a small pouch or sac on the colon. Diverticulosis is the presence of these diverticula on the colon. Diverticulitis is the irritation (inflammation) or infection of diverticula. °CAUSES  °The colon and its diverticula contain bacteria. If food particles block the tiny opening to a diverticulum, the bacteria inside can grow and cause an increase in pressure. This leads to infection and inflammation and is called diverticulitis. °SYMPTOMS  °· Abdominal pain and tenderness. Usually, the pain is located on the left side of your abdomen. However, it could be located elsewhere. °· Fever. °· Bloating. °· Feeling sick to your stomach (nausea). °· Throwing up (vomiting). °· Abnormal stools. °DIAGNOSIS  °Your caregiver will take a history and perform a physical exam. Since many things can cause abdominal pain, other tests may be necessary. Tests may include: °· Blood tests. °· Urine tests. °· X-ray of the abdomen. °· CT scan of the abdomen. °Sometimes, surgery is needed to determine if diverticulitis or other conditions are causing your symptoms. °TREATMENT  °Most of the time, you can be treated without surgery. Treatment includes: °· Resting the bowels by only having liquids for a few days. As you improve, you will need to eat a low-fiber diet. °· Intravenous (IV) fluids if you are losing body fluids (dehydrated). °· Antibiotic medicines that treat infections may be given. °· Pain and nausea medicine, if needed. °· Surgery if the inflamed diverticulum has burst. °HOME CARE INSTRUCTIONS  °· Try a clear liquid diet (broth, tea, or water for as long as directed by your caregiver). You may then gradually begin a low-fiber diet as tolerated. A low-fiber diet is a diet with less than 10 grams of fiber. Choose the foods below to reduce fiber in the diet: °· White breads, cereals, rice, and pasta. °· Cooked fruits and vegetables or soft fresh fruits and vegetables without the skin. °· Ground or  well-cooked tender beef, ham, veal, lamb, pork, or poultry. °· Eggs and seafood. °· After your diverticulitis symptoms have improved, your caregiver may put you on a high-fiber diet. A high-fiber diet includes 14 grams of fiber for every 1000 calories consumed. For a standard 2000 calorie diet, you would need 28 grams of fiber. Follow these diet guidelines to help you increase the fiber in your diet. It is important to slowly increase the amount fiber in your diet to avoid gas, constipation, and bloating. °· Choose whole-grain breads, cereals, pasta, and brown rice. °· Choose fresh fruits and vegetables with the skin on. Do not overcook vegetables because the more vegetables are cooked, the more fiber is lost. °· Choose more nuts, seeds, legumes, dried peas, beans, and lentils. °· Look for food products that have greater than 3 grams of fiber per serving on the Nutrition Facts label. °· Take all medicine as directed by your caregiver. °· If your caregiver has given you a follow-up appointment, it is very important that you go. Not going could result in lasting (chronic) or permanent injury, pain, and disability. If there is any problem keeping the appointment, call to reschedule. °SEEK MEDICAL CARE IF:  °· Your pain does not improve. °· You have a hard time advancing your diet beyond clear liquids. °· Your bowel movements do not return to normal. °SEEK IMMEDIATE MEDICAL CARE IF:  °· Your pain becomes worse. °· You have an oral temperature above 102° F (38.9° C), not controlled by medicine. °· You have repeated vomiting. °· You have bloody or black, tarry stools. °· Symptoms   that brought you to your caregiver become worse or are not getting better. °MAKE SURE YOU:  °· Understand these instructions. °· Will watch your condition. °· Will get help right away if you are not doing well or get worse. °Document Released: 01/31/2005 Document Revised: 07/16/2011 Document Reviewed: 05/29/2010 °ExitCare® Patient Information  ©2013 [employer]. ° °

## 2012-07-01 NOTE — Progress Notes (Signed)
[facility] [address] [place] [address] [phone]  Date:  07/01/2012   Name:  Erica Ball   DOB:  1950/09/23   MRN:  [id number]  PCP:  No primary provider on file.    Chief Complaint: Diverticulitis, Cyst and medication refills   History of Present Illness:  Erica Ball is a 62 y.o. very pleasant female patient who presents with the following:  Says has another outbreak of diverticulitis.  Says that she has an episode every three weeks.  Had abdominal pain started Sunday night associated with nausea and vomiting and diarrhea. No blood mucous or pus in stools.  Smokes 1 1/2 PPD and has no interest in quitting.  Says pain eased off last night but is still present.  History is very inconsistent.  Eating normally  Patient Active Problem List  Diagnosis  . Hypertension  . Diverticulitis  . Vitamin d deficiency    Past Medical History  Diagnosis Date  . Hypertension   . Allergy   . Cataract     Past Surgical History  Procedure Laterality Date  . Fracture surgery    . Abdominal hysterectomy    . Cataract extraction      History  Substance Use Topics  . Smoking status: Current Every Day Smoker  . Smokeless tobacco: Not on file  . Alcohol Use: 12.6 oz/week    21 Shots of liquor per week     Comment: drinks three cups of vodka per day    Family History  Problem Relation Age of Onset  . Hypertension Mother   . Heart disease Mother   . Hypertension Father   . Heart disease Father   . Stroke Brother   . Hypertension Brother   . Heart disease Brother     Allergies  Allergen Reactions  . Dextromethorphan Other (See Comments)    Makes her very angry and she wants to hurt people    Medication list has been reviewed and updated.  Current Outpatient Prescriptions on File Prior to Visit  Medication Sig Dispense Refill  . amLODipine (NORVASC) 10 MG tablet Take 1 tablet (10 mg total) by mouth daily.  90 tablet  3  .  ergocalciferol (VITAMIN D2) 50000 UNITS capsule Take 50,000 Units by mouth once a week.       No current facility-administered medications on file prior to visit.    Review of Systems:  As per HPI, otherwise negative.    Physical Examination: Filed Vitals:   07/01/12 1531  BP: 145/79  Pulse: 97  Temp: 98.1 F (36.7 C)  Resp: 16   Filed Vitals:   07/01/12 1531  Height: 5' 6.5" (1.689 m)  Weight: 160 lb 9.6 oz (72.848 kg)   Body mass index is 25.54 kg/(m^2). Ideal Body Weight: Weight in (lb) to have BMI = 25: 156.9  GEN: WDWN, NAD, Non-toxic, A & O x 3 HEENT: Atraumatic, Normocephalic. Neck supple. No masses, No LAD. Ears and Nose: No external deformity. CV: RRR, No M/G/R. No JVD. No thrill. No extra heart sounds. PULM: CTA B, no wheezes, crackles, rhonchi. No retractions. No resp. distress. No accessory muscle use. ABD: S, tender LLQ, ND, +BS. No rebound. No HSM. EXTR: No c/c/e NEURO Normal gait.  PSYCH: Normally interactive. Conversant. Not depressed or anxious appearing.  Calm demeanor.  BACK:  Large lipoma right posterior shoulder  Assessment and Plan: Diverticulitis cipro Flagyl Will not consider surgical consultation  Carmelina Dane, MD  Results  for orders placed in visit on 07/01/12  POCT CBC      Result Value Range   WBC 10.8 (*) 4.6 - 10.2 K/uL   Lymph, poc 1.8  0.6 - 3.4   POC LYMPH PERCENT 16.3  10 - 50 %L   MID (cbc) 0.5  0 - 0.9   POC MID % 4.8  0 - 12 %M   POC Granulocyte 8.5 (*) 2 - 6.9   Granulocyte percent 78.9  37 - 80 %G   RBC 4.71  4.04 - 5.48 M/uL   Hemoglobin 15.6  12.2 - 16.2 g/dL   HCT, POC 96.0 (*) 45.4 - 47.9 %   MCV 105.7 (*) 80 - 97 fL   MCH, POC 33.1 (*) 27 - 31.2 pg   MCHC 31.3 (*) 31.8 - 35.4 g/dL   RDW, POC 09.8     Platelet Count, POC 366  142 - 424 K/uL   MPV 7.9  0 - 99.8 fL

## 2012-10-05 ENCOUNTER — Other Ambulatory Visit: Payer: Self-pay | Admitting: Family Medicine

## 2013-03-12 ENCOUNTER — Other Ambulatory Visit: Payer: Self-pay

## 2013-04-08 ENCOUNTER — Other Ambulatory Visit: Payer: Self-pay | Admitting: Emergency Medicine

## 2013-04-08 NOTE — Telephone Encounter (Signed)
NEEDS OFFICE VISIT.

## 2013-04-09 ENCOUNTER — Telehealth: Payer: Self-pay

## 2013-04-09 NOTE — Telephone Encounter (Signed)
Patient is returning our phone call.   Best#: [phone]

## 2013-04-09 NOTE — Telephone Encounter (Signed)
Lm for rtn call. Patient needs ov for refills- sent in 30 day supply.

## 2013-04-10 NOTE — Telephone Encounter (Signed)
Advised pt she needed appt for further refills.

## 2013-05-16 ENCOUNTER — Ambulatory Visit (INDEPENDENT_AMBULATORY_CARE_PROVIDER_SITE_OTHER): Payer: BC Managed Care – PPO | Admitting: Family Medicine

## 2013-05-16 VITALS — BP 122/78 | HR 78 | Temp 98.9°F | Resp 18 | Ht 67.0 in | Wt 150.0 lb

## 2013-05-16 DIAGNOSIS — M549 Dorsalgia, unspecified: Secondary | ICD-10-CM

## 2013-05-16 DIAGNOSIS — M25519 Pain in unspecified shoulder: Secondary | ICD-10-CM

## 2013-05-16 DIAGNOSIS — M8580 Other specified disorders of bone density and structure, unspecified site: Secondary | ICD-10-CM

## 2013-05-16 DIAGNOSIS — M949 Disorder of cartilage, unspecified: Secondary | ICD-10-CM

## 2013-05-16 DIAGNOSIS — I10 Essential (primary) hypertension: Secondary | ICD-10-CM

## 2013-05-16 DIAGNOSIS — F411 Generalized anxiety disorder: Secondary | ICD-10-CM

## 2013-05-16 DIAGNOSIS — M899 Disorder of bone, unspecified: Secondary | ICD-10-CM

## 2013-05-16 DIAGNOSIS — F419 Anxiety disorder, unspecified: Secondary | ICD-10-CM

## 2013-05-16 DIAGNOSIS — M25511 Pain in right shoulder: Secondary | ICD-10-CM

## 2013-05-16 MED ORDER — LORAZEPAM 0.5 MG PO TABS: 0.5000 mg | ORAL_TABLET | Freq: Three times a day (TID) | ORAL | Status: DC

## 2013-05-16 MED ORDER — ERGOCALCIFEROL 1.25 MG (50000 UT) PO CAPS: 50000.0000 [IU] | ORAL_CAPSULE | ORAL | Status: DC

## 2013-05-16 MED ORDER — TRAMADOL HCL 50 MG PO TABS: 50.0000 mg | ORAL_TABLET | Freq: Two times a day (BID) | ORAL | Status: DC | PRN

## 2013-05-16 MED ORDER — AMLODIPINE BESYLATE 10 MG PO TABS: ORAL_TABLET | ORAL | Status: DC

## 2013-05-16 NOTE — Progress Notes (Signed)
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  No new problems.

## 2014-04-17 ENCOUNTER — Other Ambulatory Visit: Payer: Self-pay | Admitting: Family Medicine

## 2014-06-06 ENCOUNTER — Telehealth: Payer: Self-pay

## 2014-06-06 ENCOUNTER — Ambulatory Visit (INDEPENDENT_AMBULATORY_CARE_PROVIDER_SITE_OTHER): Payer: BC Managed Care – PPO

## 2014-06-06 ENCOUNTER — Ambulatory Visit (INDEPENDENT_AMBULATORY_CARE_PROVIDER_SITE_OTHER): Payer: BC Managed Care – PPO | Admitting: Family Medicine

## 2014-06-06 VITALS — BP 146/96 | HR 84 | Temp 98.2°F | Resp 16 | Ht 67.0 in | Wt 163.4 lb

## 2014-06-06 DIAGNOSIS — M5441 Lumbago with sciatica, right side: Secondary | ICD-10-CM

## 2014-06-06 DIAGNOSIS — I10 Essential (primary) hypertension: Secondary | ICD-10-CM

## 2014-06-06 DIAGNOSIS — M8580 Other specified disorders of bone density and structure, unspecified site: Secondary | ICD-10-CM

## 2014-06-06 DIAGNOSIS — M545 Low back pain: Secondary | ICD-10-CM

## 2014-06-06 DIAGNOSIS — F419 Anxiety disorder, unspecified: Secondary | ICD-10-CM

## 2014-06-06 DIAGNOSIS — E559 Vitamin D deficiency, unspecified: Secondary | ICD-10-CM

## 2014-06-06 MED ORDER — AMLODIPINE BESYLATE 10 MG PO TABS: 10.0000 mg | ORAL_TABLET | Freq: Every day | ORAL | Status: DC

## 2014-06-06 MED ORDER — TRAMADOL HCL 50 MG PO TABS: 50.0000 mg | ORAL_TABLET | Freq: Two times a day (BID) | ORAL | Status: DC | PRN

## 2014-06-06 MED ORDER — ERGOCALCIFEROL 1.25 MG (50000 UT) PO CAPS: 50000.0000 [IU] | ORAL_CAPSULE | ORAL | Status: DC

## 2014-06-06 MED ORDER — LORAZEPAM 0.5 MG PO TABS: 0.5000 mg | ORAL_TABLET | Freq: Three times a day (TID) | ORAL | Status: DC

## 2014-06-06 NOTE — Addendum Note (Signed)
Addended by: [name], Dorraine Ellender on: 06/06/2014 02:08 PM   Modules accepted: Orders

## 2014-06-06 NOTE — Progress Notes (Addendum)
This is a 64 y.o.female who complains of right posterior superior pelvic pain.  Works at [employer] Character of pain: soreness Location of pain:  As above Radiation of pain:  none Onset associated with:  Fall 9 days ago on hardwood floor after [place] into [place] from [place] Patient has a past history of low back pain for which   Gwynneth AlimentKathleen M Mosquera denies any urinary symptoms, bowel problems, numbness in the legs, loss of motor power. Gwynneth AlimentKathleen M Castelli had no fever.  SKathleen Sherryle LisM Mcmiller has tried nothing.  Past Medical History  Diagnosis Date  . Hypertension   . Allergy   . Cataract      Past Surgical History  Procedure Laterality Date  . Fracture surgery    . Abdominal hysterectomy    . Cataract extraction      Objective:  middle-aged female in no acute distress. Blood pressure 146/96, pulse 84, temperature 98.2 F (36.8 C), temperature source Oral, resp. rate 16, height 5\' 7"  (1.702 m), weight 163 lb 6.4 oz (74.118 kg), SpO2 96 %.Body mass index is 25.59 kg/(m^2). Palpation of the back reveals tenderness right posterior superior iliac crest CVA:  nontender Abdomen: soft, nontendr Peripheral pulses:  DP/PT intact Inspection of the back: Reveals no scoliosis Straight-leg raising: neg Motor exam of lower extremity: No abnormal weakness. Reflexes: Symmetric and normal Skin exam: faint ecchymosis abov sacrum  [facility] reading (PRIMARY) by  Dr. Milus GlazierLauenstein:  L-S spine film:  Negative bones, marked calcifications of aorta without dilation.   Assessment/Plan: Acute lower back pain without acute neurological findings.  This chart was scribed in my presence and reviewed by me personally.    ICD-9-CM ICD-10-CM   1. Low back pain, unspecified back pain laterality, with sciatica presence unspecified 724.2 M54.5 DG Lumbar Spine 2-3 Views  2. Low back pain with right-sided sciatica, unspecified back pain laterality 724.3 M54.41 traMADol (ULTRAM) 50 MG tablet  3.  Osteopenia 733.90 M85.80 ergocalciferol (VITAMIN D2) 50000 UNITS capsule  4. Anxiety 300.00 F41.9 LORazepam (ATIVAN) 0.5 MG tablet  5. Essential hypertension 401.9 I10 amLODipine (NORVASC) 10 MG tablet  6. Vitamin D deficiency 268.9 E55.9 Vit D  25 hydroxy (rtn osteoporosis monitoring)     Signed, Elvina SidleKurt Mohammed Mcandrew, MD

## 2014-06-06 NOTE — Telephone Encounter (Signed)
Patient is requesting her work note to be extended till Monday-returning to work on Tuesday Feb 2. Patient request the new work note to be faxed to [employer] Veronica [employer]blett at [phone]. Patients call back number is [phone]

## 2014-06-07 LAB — VITAMIN D 25 HYDROXY (VIT D DEFICIENCY, FRACTURES): Vit D, 25-Hydroxy: 32 ng/mL (ref 30–100)

## 2014-06-07 NOTE — Telephone Encounter (Signed)
Revised letter written.

## 2014-06-07 NOTE — Telephone Encounter (Signed)
I called pt to let her know, note faxed.

## 2014-07-16 ENCOUNTER — Ambulatory Visit (INDEPENDENT_AMBULATORY_CARE_PROVIDER_SITE_OTHER): Payer: BC Managed Care – PPO

## 2014-07-16 ENCOUNTER — Ambulatory Visit (INDEPENDENT_AMBULATORY_CARE_PROVIDER_SITE_OTHER): Payer: BC Managed Care – PPO | Admitting: Family Medicine

## 2014-07-16 VITALS — BP 158/76 | HR 106 | Temp 98.4°F | Ht 66.54 in | Wt 163.6 lb

## 2014-07-16 DIAGNOSIS — S59902A Unspecified injury of left elbow, initial encounter: Secondary | ICD-10-CM

## 2014-07-16 DIAGNOSIS — R059 Cough, unspecified: Secondary | ICD-10-CM

## 2014-07-16 DIAGNOSIS — R05 Cough: Secondary | ICD-10-CM | POA: Diagnosis not present

## 2014-07-16 MED ORDER — HYDROCODONE-ACETAMINOPHEN 5-325 MG PO TABS: 1.0000 | ORAL_TABLET | Freq: Four times a day (QID) | ORAL | Status: DC | PRN

## 2014-07-16 MED ORDER — HYDROCODONE-HOMATROPINE 5-1.5 MG/5ML PO SYRP: 5.0000 mL | ORAL_SOLUTION | Freq: Three times a day (TID) | ORAL | Status: DC | PRN

## 2014-07-16 NOTE — Progress Notes (Addendum)
° °  Subjective:    Patient ID: Erica Ball, female    DOB: 1950/07/30, 64 y.o.   MRN: [id number] This chart was scribed for Elvina SidleKurt Lauenstein, MD,  by Ronney LionSuzanne Le, ED Scribe. This patient was seen in room 2 and the patient's care was started at 2:38 PM.   HPI Chief Complaint  Patient presents with   Arm Injury    Patient states that she hit her elbow and forearm on a door when she was mad this morning. She states that her arm is sore. She states that the door smacked in to her elbow.    HPI Comments: Erica Ball is a 64 y.o. female who presents to the [facility] complaining of a left arm injury that occurred this morning. She swung the door open forcefully and hit her on the left forearm near her elbow. She complains of moderate, constant, sore associated pain and slight numbness.   Patient works at [employer].   Review of Systems  Musculoskeletal: Positive for myalgias.  Skin: Positive for color change.  Neurological: Positive for numbness.       Objective:   Physical Exam  Constitutional: She is oriented to person, place, and time. She appears well-developed and well-nourished. No distress.  Complexion appears gray.  HENT:  Head: Normocephalic and atraumatic.  Eyes: Conjunctivae and EOM are normal.  Neck: Neck supple. No tracheal deviation present.  Cardiovascular: Normal rate.   Pulmonary/Chest: Effort normal. No respiratory distress. She has wheezes.  Expiratory wheezes  Musculoskeletal: Normal range of motion. She exhibits tenderness.  Very large bruise which is tender over lateral epicondyle of left elbow  Neurological: She is alert and oriented to person, place, and time.  Skin: Skin is warm and dry.  Psychiatric: She has a normal mood and affect. Her behavior is normal.  Nursing note and vitals reviewed. good ROM left elbow [facility] reading (PRIMARY) by  Dr. Milus GlazierLauenstein:  Left elbow.  Osteoporotic appearing bones, difficult to eliminate the  possibility of a radial head fracture      Assessment & Plan:   This chart was scribed in my presence and reviewed by me personally.    ICD-9-CM ICD-10-CM   1. Elbow injury, left, initial encounter 959.3 S59.902A DG Elbow Complete Left     HYDROcodone-acetaminophen (NORCO) 5-325 MG per tablet  2. Cough 786.2 R05 HYDROcodone-homatropine (HYCODAN) 5-1.5 MG/5ML syrup   Recheck Monday night Sugar tong splint to left arm  Signed, Elvina SidleKurt Lauenstein, MD

## 2014-07-16 NOTE — Patient Instructions (Signed)
Return when the night for recheck

## 2014-07-19 ENCOUNTER — Ambulatory Visit (INDEPENDENT_AMBULATORY_CARE_PROVIDER_SITE_OTHER): Payer: BC Managed Care – PPO | Admitting: Family Medicine

## 2014-07-19 VITALS — BP 142/70 | HR 93 | Temp 98.8°F | Resp 16 | Ht 63.5 in | Wt 163.0 lb

## 2014-07-19 DIAGNOSIS — S40022D Contusion of left upper arm, subsequent encounter: Secondary | ICD-10-CM | POA: Diagnosis not present

## 2014-07-19 NOTE — Progress Notes (Signed)
° °  Subjective:    Patient ID: Erica Ball, female    DOB: April 14, 1951, 64 y.o.   MRN: [id number]  HPI Chief Complaint  Patient presents with   Follow-up   Arm Injury   This chart was scribed for Elvina SidleKurt Lauenstein, MD by Andrew Auaven Small, ED Scribe. This patient was seen in room 3 and the patient's care was started at 6:34 PM.  HPI Comments: Erica Ball is a 64 y.o. female who presents to the [facility] for follow up regarding left elbow injury. Pt was seen here 3 days ago for left elbow injury. Pt has had left hand swelling possibly due to the tightness of sugar tong splint. Pt reports improvement with left elbow swelling and pain that she currently rates a 5/10. Pt states she has Vicodin that she was prescribed visit.   Past Medical History  Diagnosis Date   Hypertension    Allergy    Cataract    Past Surgical History  Procedure Laterality Date   Fracture surgery     Abdominal hysterectomy     Cataract extraction     Prior to Admission medications   Medication Sig Start Date End Date Taking? Authorizing Provider  amLODipine (NORVASC) 10 MG tablet Take 1 tablet (10 mg total) by mouth daily. 06/06/14  Yes Elvina SidleKurt Lauenstein, MD  HYDROcodone-acetaminophen (NORCO) 5-325 MG per tablet Take 1 tablet by mouth every 6 (six) hours as needed for moderate pain. 07/16/14  Yes Elvina SidleKurt Lauenstein, MD  HYDROcodone-homatropine [facility](HYCODAN) 5-1.5 MG/5ML syrup Take 5 mLs by mouth every 8 (eight) hours as needed for cough. 07/16/14  Yes Elvina SidleKurt Lauenstein, MD  LORazepam (ATIVAN) 0.5 MG tablet Take 1 tablet (0.5 mg total) by mouth every 8 (eight) hours. 06/06/14  Yes Elvina SidleKurt Lauenstein, MD  traMADol (ULTRAM) 50 MG tablet Take 1 tablet (50 mg total) by mouth every 12 (twelve) hours as needed. 06/06/14  Yes Elvina SidleKurt Lauenstein, MD    Review of Systems  Musculoskeletal: Positive for arthralgias.  Neurological: Negative for weakness and numbness.       Objective:   Physical Exam    Constitutional: She is oriented to person, place, and time. She appears well-developed and well-nourished. No distress.  HENT:  Head: Normocephalic and atraumatic.  Eyes: Conjunctivae and EOM are normal.  Neck: Neck supple.  Cardiovascular: Normal rate.   Pulmonary/Chest: Effort normal.  Musculoskeletal: Normal range of motion.  Marked swelling in hand. Ecchymosis over the epicondyle of left arm  Neurological: She is alert and oriented to person, place, and time.  Skin: Skin is warm and dry.  Psychiatric: She has a normal mood and affect. Her behavior is normal.  Nursing note and vitals reviewed.  Radiology review of x-ray shows no fracture Assessment & Plan:   Take tomorrow off from work and return on Wednesday. She the left elbow bandaged but stop the sugar tong splint.  Recheck on Friday afternoon  Signed, Sheila OatsKurt Lauenstein M.D.

## 2014-07-23 ENCOUNTER — Ambulatory Visit (INDEPENDENT_AMBULATORY_CARE_PROVIDER_SITE_OTHER): Payer: BC Managed Care – PPO | Admitting: Family Medicine

## 2014-07-23 ENCOUNTER — Ambulatory Visit (INDEPENDENT_AMBULATORY_CARE_PROVIDER_SITE_OTHER): Payer: BC Managed Care – PPO

## 2014-07-23 VITALS — BP 140/70 | HR 91 | Temp 98.1°F | Resp 16 | Ht 63.5 in | Wt 164.0 lb

## 2014-07-23 DIAGNOSIS — S59902A Unspecified injury of left elbow, initial encounter: Secondary | ICD-10-CM

## 2014-07-23 MED ORDER — HYDROCODONE-ACETAMINOPHEN 5-325 MG PO TABS: 1.0000 | ORAL_TABLET | Freq: Four times a day (QID) | ORAL | Status: DC | PRN

## 2014-07-23 NOTE — Progress Notes (Signed)
Subjective:    Patient ID: Erica Ball, female DOB: 1950/08/26, 64 y.o. MRN: [id number]  HPI Chief Complaint  Patient presents with  . Follow-up  . Arm Injury   Note from 3/14:  HPI Comments: Erica Ball is a 64 y.o. female who presents to the [facility] for follow up regarding left elbow injury. Pt was seen here 3 days ago for left elbow injury. Pt has had left hand swelling possibly due to the tightness of sugar tong splint. Pt reports improvement with left elbow swelling and pain that she currently rates a 5/10. Pt states she has Vicodin that she was prescribed visit.         Today's visit: Patient still has tenderness and swelling over the lateral epicondyles. She has good range of motion of the elbow both with pronation and supination as well as flexion extension.  [facility] reading (PRIMARY) by  Dr. Milus GlazierLauenstein repeat x-ray of the left elbow reveals demineralization but no definite fracture   This chart was scribed in my presence and reviewed by me personally.    ICD-9-CM ICD-10-CM   1. Elbow injury, left, initial encounter 959.3 S59.902A HYDROcodone-acetaminophen (NORCO) 5-325 MG per tablet     DG Elbow Complete Left   follow-up in one week, continue the Ace wrap webril combination to pad the elbow Signed, Elvina SidleKurt Jaydis Duchene, MD .

## 2014-07-30 ENCOUNTER — Ambulatory Visit (INDEPENDENT_AMBULATORY_CARE_PROVIDER_SITE_OTHER): Payer: BC Managed Care – PPO | Admitting: Family Medicine

## 2014-07-30 VITALS — BP 132/70 | HR 99 | Temp 98.1°F | Resp 18 | Wt 163.0 lb

## 2014-07-30 DIAGNOSIS — S5002XS Contusion of left elbow, sequela: Secondary | ICD-10-CM

## 2014-07-30 NOTE — Progress Notes (Signed)
° °  Subjective:    Patient ID: Erica Ball, female    DOB: 1950/08/04, 64 y.o.   MRN: [id number] This chart was scribed for Elvina SidleKurt Lauenstein, MD by Jolene Provostobert Halas, Medical Scribe. This patient was seen in Room 14 and the patient's care was started a 2:28 PM.  Chief Complaint  Patient presents with   Follow-up    lt elbow   Elbow Injury    HPI HPI Comments: Erica Ball is a 64 y.o. female with a hx of HTN and diverticulitis who [facility] follow up due to a left elbow injury. Pt reports mild itching at the site of the injury. Pt states she can tell it is healing. Pt states the swelling and pain have reduced.   [facility] for this concern.     Review of Systems  Constitutional: Negative for fever and chills.  Musculoskeletal: Positive for joint swelling.       Bruising and swelling of the left elbow.       Objective:   Physical Exam  Constitutional: She is oriented to person, place, and time. She appears well-developed and well-nourished. No distress.  HENT:  Head: Normocephalic and atraumatic.  Eyes: Pupils are equal, round, and reactive to light.  Neck: Neck supple.  Cardiovascular: Normal rate.   Pulmonary/Chest: Effort normal. No respiratory distress.  Musculoskeletal: Normal range of motion.  Neurological: She is alert and oriented to person, place, and time. Coordination normal.  Skin: Skin is warm and dry. She is not diaphoretic.  Psychiatric: She has a normal mood and affect. Her behavior is normal.  Nursing note and vitals reviewed.      Assessment & Plan:  To summarize, Erica Ball originally injured her left elbow on March 11. A door at work smacked into the lateral epicondyles and the radius of her left elbow.  She had significant swelling, bruising, and pain. I took her out of work for a few days and then on subsequent visits (March 11, March 14, March 18), allowed her to do more as long as  she The elbow bandage.. She's been back at work since I saw her one week ago today (March 18).  At this point she still has significant swelling, tenderness, and bruising but things are improving significantly over last week and I'm allowing her to go back full-time next week. I would like to see her again in 3 weeks.  If you have any questions or concerns, please don't hesitate to call.  This chart was scribed in my presence and reviewed by me personally.    ICD-9-CM ICD-10-CM   1. Elbow contusion, left, sequela 906.3 S50.02XS    Recheck 3 weeks  Signed, Elvina SidleKurt Lauenstein, MD

## 2014-08-20 ENCOUNTER — Ambulatory Visit (INDEPENDENT_AMBULATORY_CARE_PROVIDER_SITE_OTHER): Payer: BC Managed Care – PPO | Admitting: Emergency Medicine

## 2014-08-20 VITALS — BP 130/78 | HR 89 | Temp 98.3°F | Resp 20 | Ht 67.0 in | Wt 164.8 lb

## 2014-08-20 DIAGNOSIS — Z720 Tobacco use: Secondary | ICD-10-CM | POA: Diagnosis not present

## 2014-08-20 DIAGNOSIS — S5002XS Contusion of left elbow, sequela: Secondary | ICD-10-CM

## 2014-08-20 NOTE — Progress Notes (Signed)
[facility] [address] [place] [address] [phone]  Date:  08/20/2014   Name:  Erica Ball   DOB:  09-Jun-1950   MRN:  [id number]  PCP:  Elvina Sidle, MD    Chief Complaint: Follow-up   History of Present Illness:  Erica Ball is a 64 y.o. very pleasant female patient who presents with the following:  Injured left elbow back in March.   Has marked improvement over subsequent visits  Now has no pain, swelling and full painless ROM Smokes No improvement with over the counter medications or other home remedies.  Denies other complaint or health concern today.   Patient Active Problem List   Diagnosis Date Noted  . Tobacco abuse 08/20/2014  . Hypertension 10/30/2011  . Diverticulitis 10/30/2011  . Vitamin D deficiency 10/30/2011    Past Medical History  Diagnosis Date  . Hypertension   . Allergy   . Cataract     Past Surgical History  Procedure Laterality Date  . Fracture surgery    . Abdominal hysterectomy    . Cataract extraction      History  Substance Use Topics  . Smoking status: Current Every Day Smoker -- 1.50 packs/day    Types: Cigarettes  . Smokeless tobacco: Not on file  . Alcohol Use: 12.6 oz/week    21 Shots of liquor per week     Comment: drinks three cups of vodka per day    Family History  Problem Relation Age of Onset  . Hypertension Mother   . Heart disease Mother   . Hypertension Father   . Heart disease Father   . Stroke Brother   . Hypertension Brother   . Heart disease Brother     Allergies  Allergen Reactions  . Dextromethorphan Other (See Comments)    Makes her very angry and she wants to hurt people    Medication list has been reviewed and updated.  Current Outpatient Prescriptions on File Prior to Visit  Medication Sig Dispense Refill  . amLODipine (NORVASC) 10 MG tablet Take 1 tablet (10 mg total) by mouth daily. 90 tablet 3  . HYDROcodone-acetaminophen (NORCO) 5-325 MG  per tablet Take 1 tablet by mouth every 6 (six) hours as needed for moderate pain. 30 tablet 0  . HYDROcodone-homatropine (HYCODAN) 5-1.5 MG/5ML syrup Take 5 mLs by mouth every 8 (eight) hours as needed for cough. 120 mL 0  . LORazepam (ATIVAN) 0.5 MG tablet Take 1 tablet (0.5 mg total) by mouth every 8 (eight) hours. 30 tablet 5  . traMADol (ULTRAM) 50 MG tablet Take 1 tablet (50 mg total) by mouth every 12 (twelve) hours as needed. 30 tablet 5   No current facility-administered medications on file prior to visit.    Review of Systems:  As per HPI, otherwise negative.    Physical Examination: Filed Vitals:   08/20/14 1254  BP: 130/78  Pulse: 89  Temp: 98.3 F (36.8 C)  Resp: 20   Filed Vitals:   08/20/14 1254  Height:  (1.702 m)  Weight: 164 lb 12.8 oz (74.753 kg)   Body mass index is 25.81 kg/(m^2). Ideal Body Weight: Weight in (lb) to have BMI = 25: 159.3   GEN: WDWN, NAD, Non-toxic, Alert & Oriented x 3 HEENT: Atraumatic, Normocephalic.  Ears and Nose: No external deformity. EXTR: No clubbing/cyanosis/edema NEURO: Normal gait.  PSYCH: Normally interactive. Conversant. Not depressed or anxious appearing.  Calm demeanor.  LEFT elbow:  Full  painless ROM. No tenderness   Assessment and Plan: Elbow contusion Tobacco abuse disorder   Signed,  Phillips OdorJeffery Deveron Shamoon, MD

## 2014-08-20 NOTE — Patient Instructions (Signed)
Smoking Cessation Quitting smoking is important to your health and has many advantages. However, it is not always easy to quit since nicotine is a very addictive drug. Oftentimes, people try 3 times or more before being able to quit. This document explains the best ways for you to prepare to quit smoking. Quitting takes hard work and a lot of effort, but you can do it. ADVANTAGES OF QUITTING SMOKING  You will live longer, feel better, and live better.  Your body will feel the impact of quitting smoking almost immediately.  Within 20 minutes, blood pressure decreases. Your pulse returns to its normal level.  After 8 hours, carbon monoxide levels in the blood return to normal. Your oxygen level increases.  After 24 hours, the chance of having a heart attack starts to decrease. Your breath, hair, and body stop smelling like smoke.  After 48 hours, damaged nerve endings begin to recover. Your sense of taste and smell improve.  After 72 hours, the body is virtually free of nicotine. Your bronchial tubes relax and breathing becomes easier.  After 2 to 12 weeks, lungs can hold more air. Exercise becomes easier and circulation improves.  The risk of having a heart attack, stroke, cancer, or lung disease is greatly reduced.  After 1 year, the risk of coronary heart disease is cut in half.  After 5 years, the risk of stroke falls to the same as a nonsmoker.  After 10 years, the risk of lung cancer is cut in half and the risk of other cancers decreases significantly.  After 15 years, the risk of coronary heart disease drops, usually to the level of a nonsmoker.  If you are pregnant, quitting smoking will improve your chances of having a healthy baby.  The people you live with, especially any children, will be healthier.  You will have extra money to spend on things other than cigarettes. QUESTIONS TO THINK ABOUT BEFORE ATTEMPTING TO QUIT You may want to talk about your answers with your  health care provider.  Why do you want to quit?  If you tried to quit in the past, what helped and what did not?  What will be the most difficult situations for you after you quit? How will you plan to handle them?  Who can help you through the tough times? Your family? Friends? A health care provider?  What pleasures do you get from smoking? What ways can you still get pleasure if you quit? Here are some questions to ask your health care provider:  How can you help me to be successful at quitting?  What medicine do you think would be best for me and how should I take it?  What should I do if I need more help?  What is smoking withdrawal like? How can I get information on withdrawal? GET READY  Set a quit date.  Change your environment by getting rid of all cigarettes, ashtrays, matches, and lighters in your home, car, or work. Do not let people smoke in your home.  Review your past attempts to quit. Think about what worked and what did not. GET SUPPORT AND ENCOURAGEMENT You have a better chance of being successful if you have help. You can get support in many ways.  Tell your family, friends, and coworkers that you are going to quit and need their support. Ask them not to smoke around you.  Get individual, group, or telephone counseling and support. Programs are available at local hospitals and health centers. Call   your local health department for information about programs in your area.  Spiritual beliefs and practices may help some smokers quit.  Download a "quit meter" on your computer to keep track of quit statistics, such as how long you have gone without smoking, cigarettes not smoked, and money saved.  Get a self-help book about quitting smoking and staying off tobacco. LEARN NEW SKILLS AND BEHAVIORS  Distract yourself from urges to smoke. Talk to someone, go for a walk, or occupy your time with a task.  Change your normal routine. Take a different route to work.  Drink tea instead of coffee. Eat breakfast in a different place.  Reduce your stress. Take a hot bath, exercise, or read a book.  Plan something enjoyable to do every day. Reward yourself for not smoking.  Explore interactive web-based programs that specialize in helping you quit. GET MEDICINE AND USE IT CORRECTLY Medicines can help you stop smoking and decrease the urge to smoke. Combining medicine with the above behavioral methods and support can greatly increase your chances of successfully quitting smoking.  Nicotine replacement therapy helps deliver nicotine to your body without the negative effects and risks of smoking. Nicotine replacement therapy includes nicotine gum, lozenges, inhalers, nasal sprays, and skin patches. Some may be available over-the-counter and others require a prescription.  Antidepressant medicine helps people abstain from smoking, but how this works is unknown. This medicine is available by prescription.  Nicotinic receptor partial agonist medicine simulates the effect of nicotine in your brain. This medicine is available by prescription. Ask your health care provider for advice about which medicines to use and how to use them based on your health history. Your health care provider will tell you what side effects to look out for if you choose to be on a medicine or therapy. Carefully read the information on the package. Do not use any other product containing nicotine while using a nicotine replacement product.  RELAPSE OR DIFFICULT SITUATIONS Most relapses occur within the first 3 months after quitting. Do not be discouraged if you start smoking again. Remember, most people try several times before finally quitting. You may have symptoms of withdrawal because your body is used to nicotine. You may crave cigarettes, be irritable, feel very hungry, cough often, get headaches, or have difficulty concentrating. The withdrawal symptoms are only temporary. They are strongest  when you first quit, but they will go away within 10-14 days. To reduce the chances of relapse, try to:  Avoid drinking alcohol. Drinking lowers your chances of successfully quitting.  Reduce the amount of caffeine you consume. Once you quit smoking, the amount of caffeine in your body increases and can give you symptoms, such as a rapid heartbeat, sweating, and anxiety.  Avoid smokers because they can make you want to smoke.  Do not let weight gain distract you. Many smokers will gain weight when they quit, usually less than 10 pounds. Eat a healthy diet and stay active. You can always lose the weight gained after you quit.  Find ways to improve your mood other than smoking. FOR MORE INFORMATION  www.smokefree.gov  Document Released: 04/17/2001 Document Revised: 09/07/2013 Document Reviewed: 08/02/2011 Exit[employer] Patient Information 2015 [employer]. This information is not intended to replace advice given to you by your health care provider. Make sure you discuss any questions you have with your health care provider.  

## 2014-10-15 ENCOUNTER — Encounter: Payer: Self-pay | Admitting: *Deleted

## 2014-11-01 ENCOUNTER — Other Ambulatory Visit: Payer: Self-pay

## 2015-05-24 ENCOUNTER — Other Ambulatory Visit: Payer: Self-pay | Admitting: Family Medicine

## 2015-08-24 ENCOUNTER — Other Ambulatory Visit: Payer: Self-pay | Admitting: Family Medicine

## 2015-10-08 ENCOUNTER — Other Ambulatory Visit: Payer: Self-pay | Admitting: Family Medicine

## 2016-02-01 IMAGING — CR DG ELBOW COMPLETE 3+V*L*
4 series · 4 of 4 positions shown · non-contrast
Comparison: Radiograph 07/16/2014

CLINICAL DATA: Followup left elbow injury.

EXAM:
LEFT ELBOW - COMPLETE 3+ VIEW

[lateral (1 of 2)]
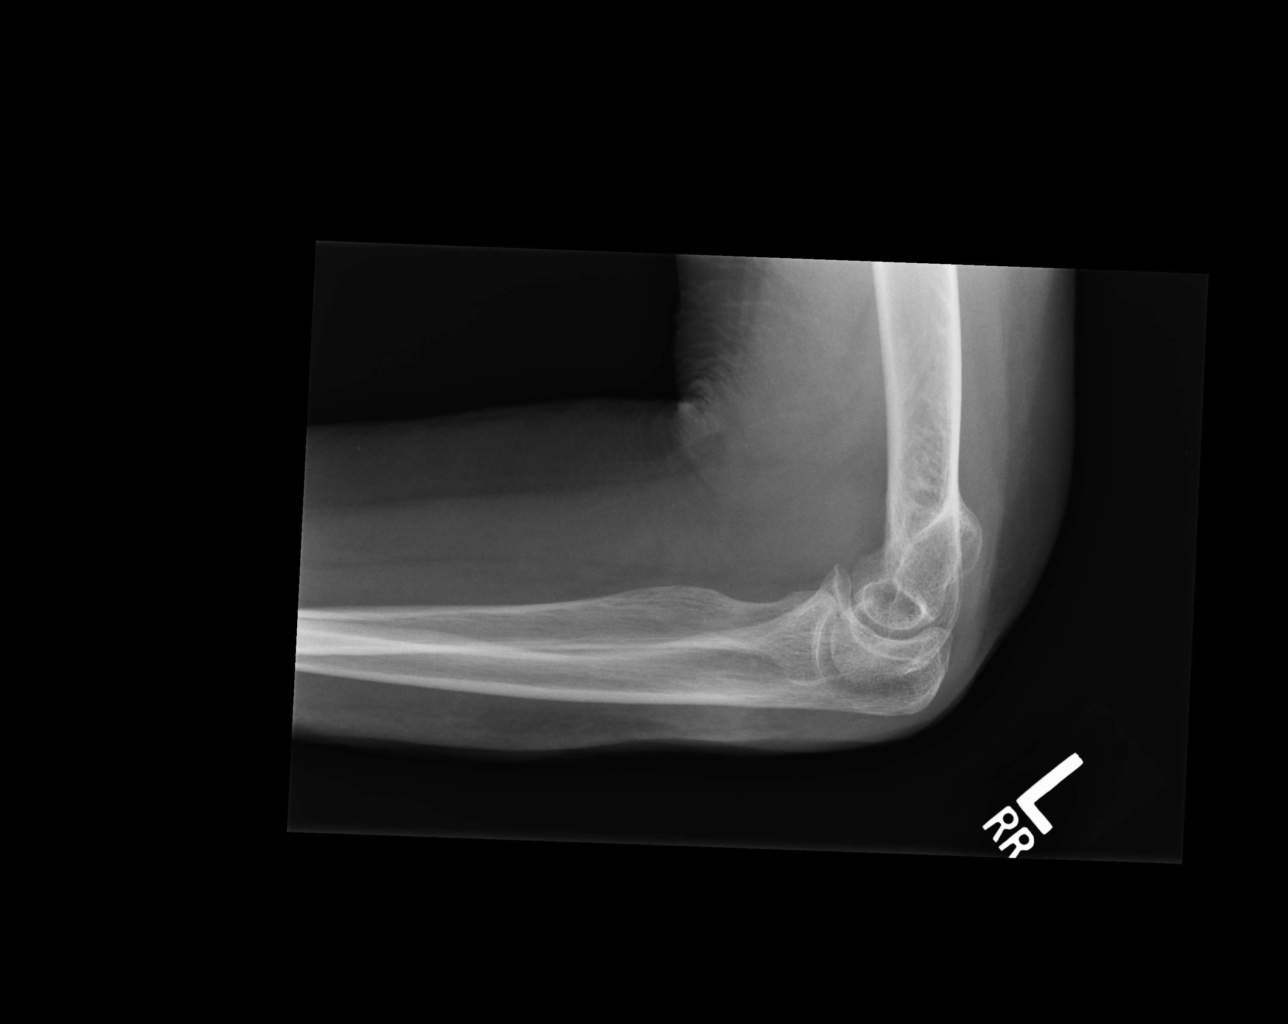

[lateral (2 of 2)]
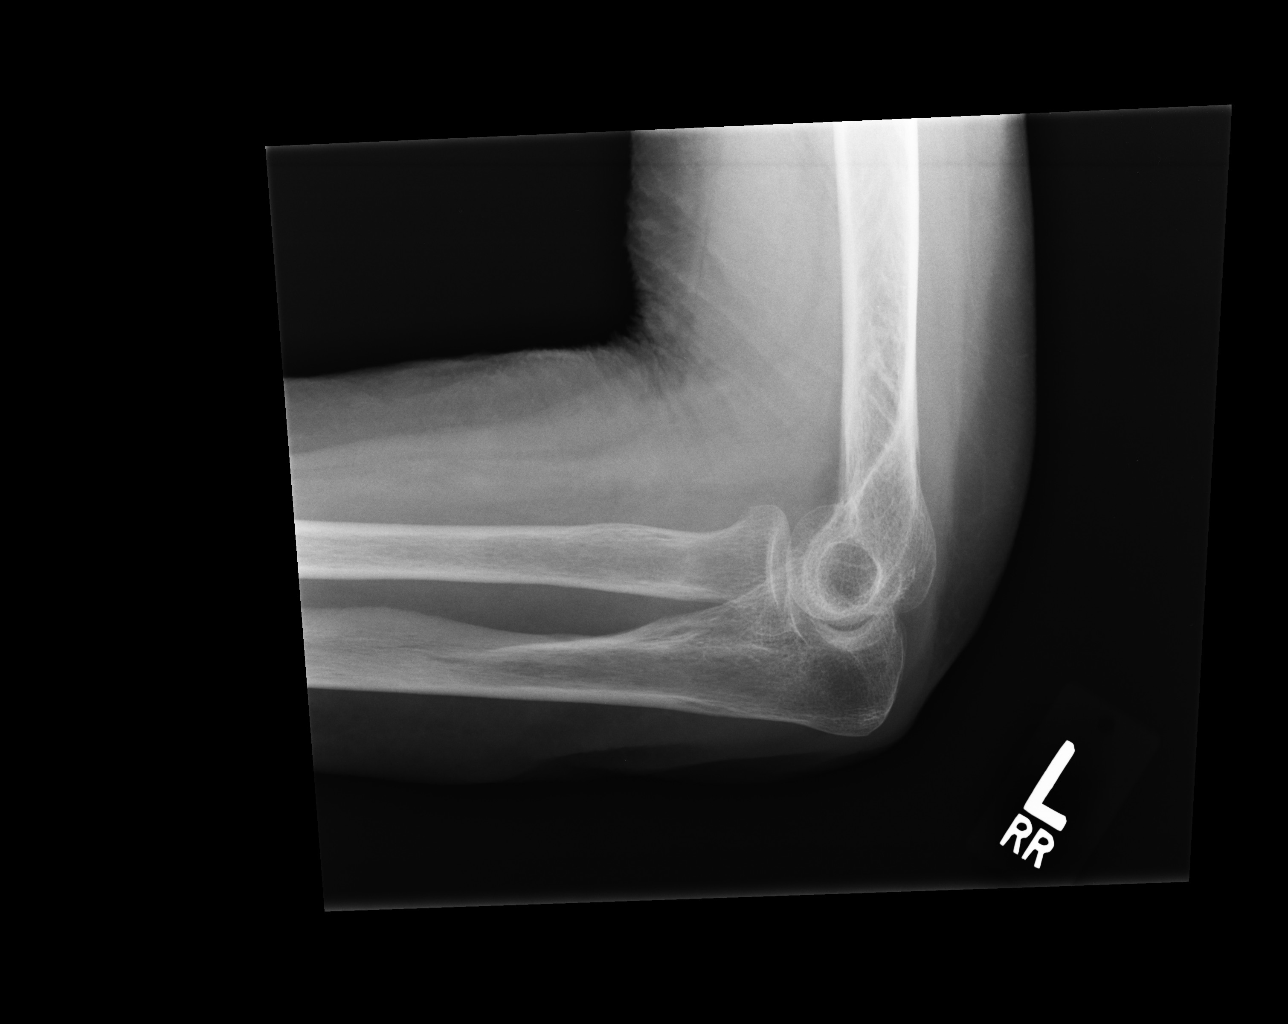

[ap obl int rot]
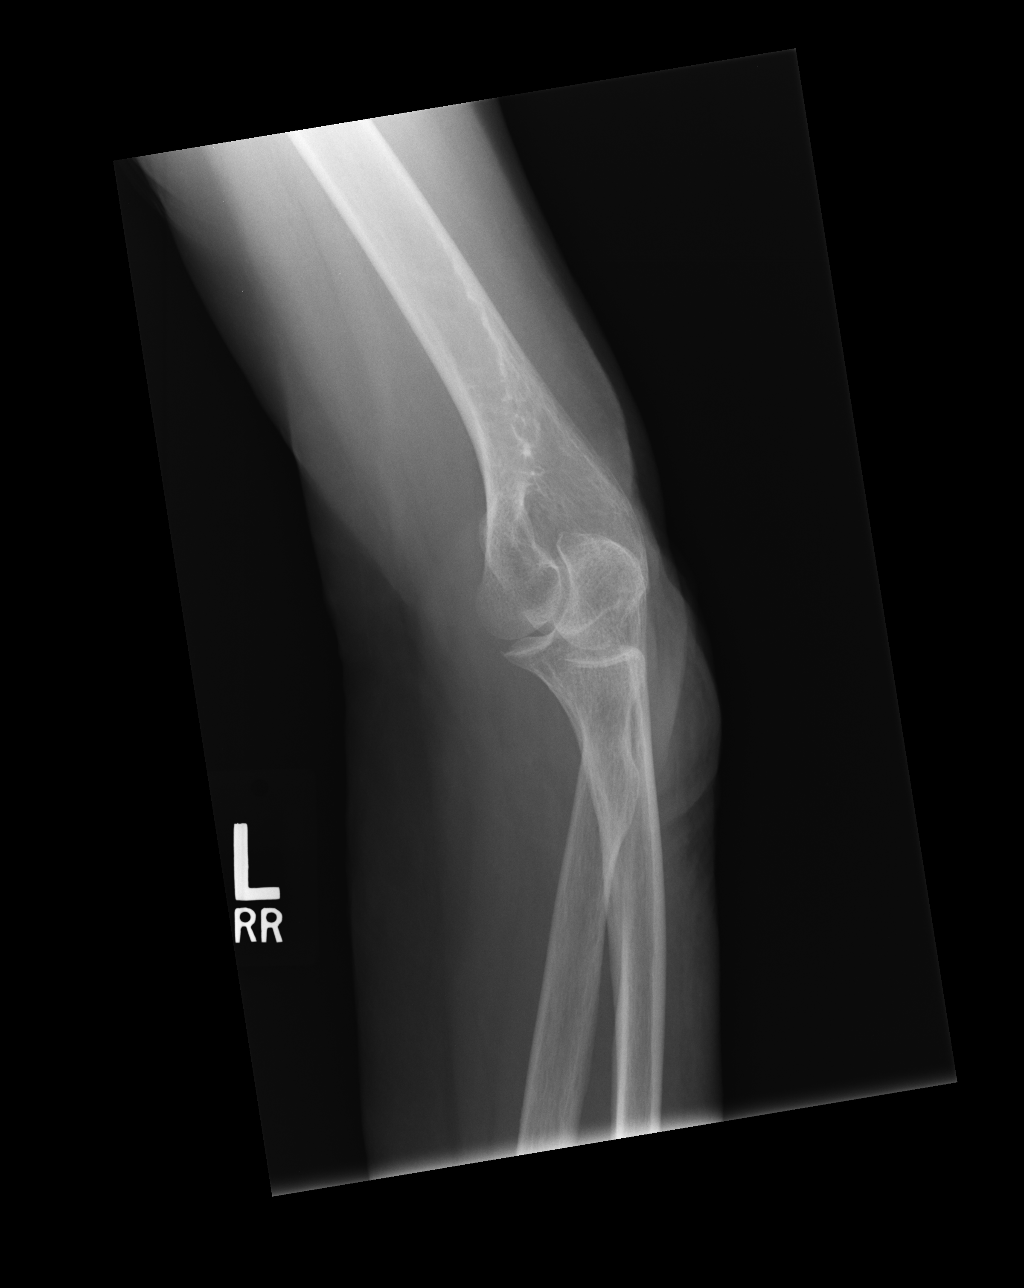

[ap obl ext rot]
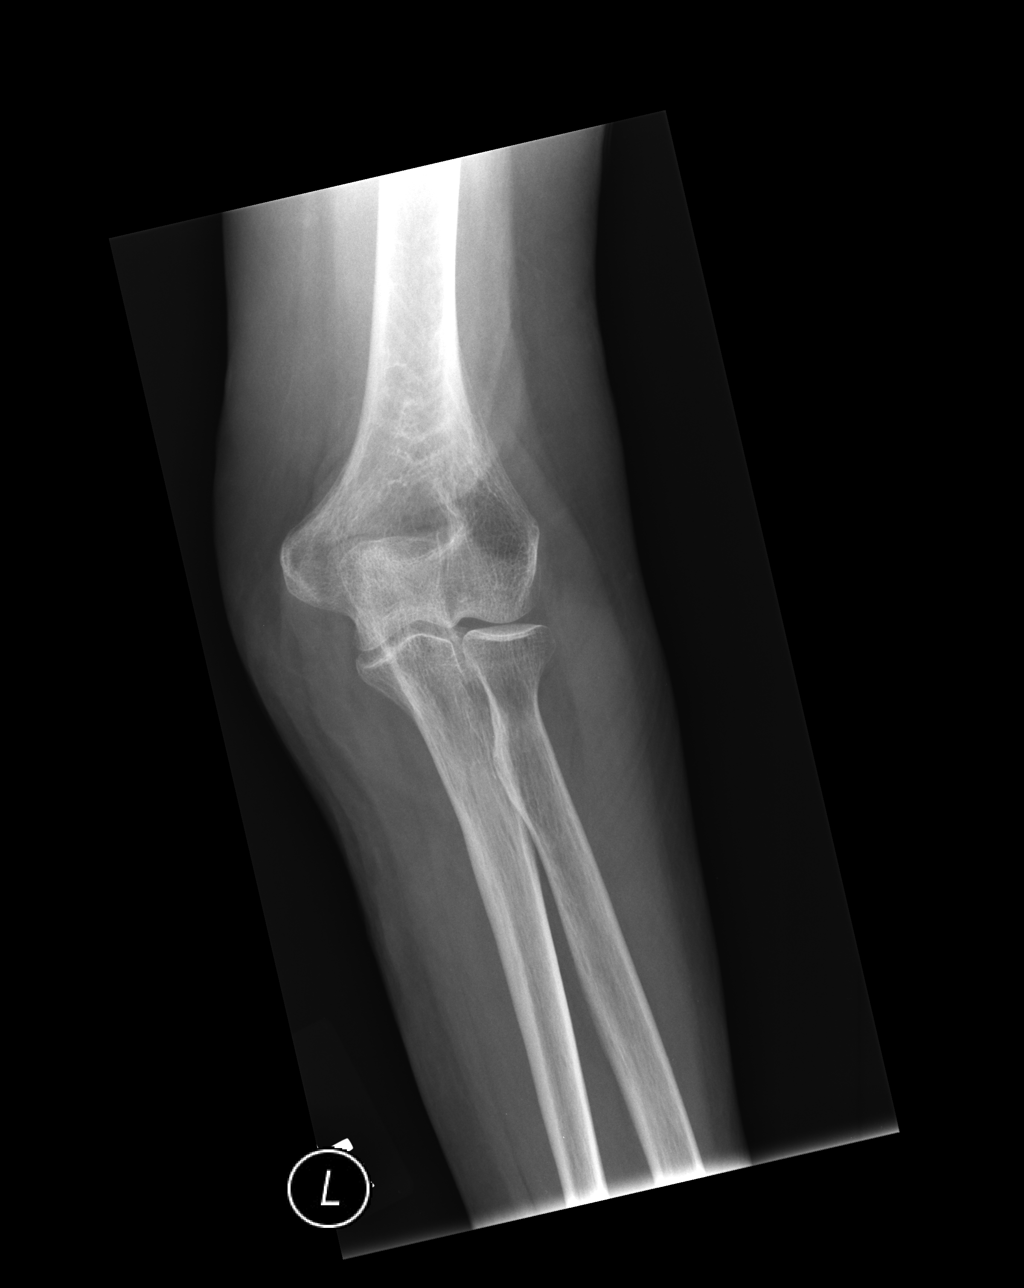

[4 of 4 positions shown; findings below may reference images not displayed]

FINDINGS: No evidence of fracture of the ulna or humerus. The radial head is
normal. No joint effusion.
IMPRESSION: No fracture or dislocation.

## 2016-07-23 ENCOUNTER — Ambulatory Visit (HOSPITAL_COMMUNITY)
Admission: EM | Admit: 2016-07-23 | Discharge: 2016-07-23 | Disposition: A | Discharge status: Home / Self Care | Payer: BC Managed Care – PPO | Attending: Family Medicine | Admitting: Family Medicine

## 2016-07-23 ENCOUNTER — Encounter (HOSPITAL_COMMUNITY): Payer: Self-pay | Admitting: Family Medicine

## 2016-07-23 DIAGNOSIS — R040 Epistaxis: Secondary | ICD-10-CM

## 2016-07-23 DIAGNOSIS — I10 Essential (primary) hypertension: Secondary | ICD-10-CM | POA: Diagnosis not present

## 2016-07-23 MED ORDER — LIDOCAINE VISCOUS 2 % MT SOLN
OROMUCOSAL | Status: AC
  Filled 2016-07-23: qty 15

## 2016-07-23 MED ORDER — TRAMADOL HCL 50 MG PO TABS: 50.0000 mg | ORAL_TABLET | Freq: Four times a day (QID) | ORAL | 0 refills | Status: DC | PRN

## 2016-07-23 NOTE — ED Provider Notes (Signed)
CSN: [id number]     Arrival date & time 07/23/16  1045 History   First MD Initiated Contact with Patient 07/23/16 1152     Chief Complaint  Patient presents with  . Epistaxis   (Consider location/radiation/quality/duration/timing/severity/associated sxs/prior Treatment) 66 year old female states that approximately 2 nights ago she had an episode of epistaxis from one side of her nose that lasted just for a relatively short time. The following day she had another episode of epistaxis from the other side of the nose. It eventually stopped with leaning forward and holding pressure. This morning upon awakening around 7 AM she developed bilateral nasal bleeding. He has been continuous. She has placed packing in her nose and has not stopped. She presents holding her nose and dripping blood with homemade rolled packing in each nostril.      Past Medical History:  Diagnosis Date  . Allergy   . Cataract   . Hypertension    Past Surgical History:  Procedure Laterality Date  . ABDOMINAL HYSTERECTOMY    . CATARACT EXTRACTION    . FRACTURE SURGERY     Family History  Problem Relation Age of Onset  . Hypertension Mother   . Heart disease Mother   . Hypertension Father   . Heart disease Father   . Stroke Brother   . Hypertension Brother   . Heart disease Brother    Social History  Substance Use Topics  . Smoking status: Current Every Day Smoker    Packs/day: 1.50    Types: Cigarettes  . Smokeless tobacco: Never Used  . Alcohol use 12.6 oz/week    21 Shots of liquor per week     Comment: drinks three cups of vodka per day   OB History    No data available     Review of Systems  Constitutional: Negative.   HENT: Positive for nosebleeds. Negative for congestion.   Eyes: Negative.   Respiratory: Negative.   Cardiovascular: Negative.   Gastrointestinal: Negative.   Neurological: Negative.   All other systems reviewed and are negative.   Allergies  Dextromethorphan  Home  Medications   Prior to Admission medications   Medication Sig Start Date End Date Taking? Authorizing Provider  amLODipine (NORVASC) 10 MG tablet TAKE 1 TABLET BY MOUTH EVERY DAY 08/25/15   Elvina SidleKurt Lauenstein, MD  HYDROcodone-homatropine [facility](HYCODAN) 5-1.5 MG/5ML syrup Take 5 mLs by mouth every 8 (eight) hours as needed for cough. 07/16/14   Elvina SidleKurt Lauenstein, MD  LORazepam (ATIVAN) 0.5 MG tablet Take 1 tablet (0.5 mg total) by mouth every 8 (eight) hours. 06/06/14   Elvina SidleKurt Lauenstein, MD  traMADol (ULTRAM) 50 MG tablet Take 1 tablet (50 mg total) by mouth every 6 (six) hours as needed. 07/23/16   Hayden Rasmussenavid Tenea Sens, NP   Meds Ordered and Administered this Visit  Medications - No data to display  BP (!) 163/110   Pulse (!) 115   Temp 98.1 F (36.7 C)   Resp 18   SpO2 97%  No data found.   Physical Exam  Constitutional: She is oriented to person, place, and time. She appears well-developed and well-nourished. No distress.  HENT:  Head: Normocephalic and atraumatic.  Bleeding from both nostrils.  Neck: Neck supple.  Cardiovascular: Normal rate.   Mild tachycardia at 115.  Pulmonary/Chest: Effort normal.  Musculoskeletal: Normal range of motion.  Neurological: She is alert and oriented to person, place, and time.  Skin: Skin is warm and dry.  Psychiatric: She has a normal mood and  affect.  Nursing note and vitals reviewed.   [facility] Course     .Epistaxis Management Date/Time: 07/23/2016 12:23 PM Performed by: Phineas Real, Kaila Devries Authorized by: Elvina Sidle   Consent:    Consent obtained:  Verbal   Consent given by:  Patient   Risks discussed:  Bleeding and pain   Alternatives discussed:  Delayed treatment Anesthesia (see MAR for exact dosages):    Anesthesia method:  Topical application Procedure details:    Treatment site:  L anterior and R anterior   Treatment method:  Anterior pack   Treatment complexity:  Limited   Treatment episode: initial   Post-procedure details:     Assessment:  Bleeding stopped   Patient tolerance of procedure:  Tolerated well, no immediate complications Comments:     2 nasal sponges were placed in each nostril. One was a [employer] the other was a 4 inch wet sponge, each coated with 2% viscous Xylocaine.    (including critical care time)  Labs Review Labs Reviewed - No data to display  Imaging Review No results found.   Visual Acuity Review  Right Eye Distance:   Left Eye Distance:   Bilateral Distance:    Right Eye Near:   Left Eye Near:    Bilateral Near:         MDM   1. Epistaxis, recurrent   2. Essential hypertension   3. Anterior epistaxis    Patient was observed one hour after insertion of the bilateral anterior nasal packing. She is had no bleeding since the introduction of the packing. She states she feels much better. She will be discharged home with the packing remaining and return tomorrow morning to have the packing removed and to be reevaluated for any continued bleeding sites. Leave the packing in for at least 24 hours. He may return tomorrow for packing removal and reassessment of the site of bleeding. He may take the tramadol for pain or sleep if needed. If bleeding restarts later today or tonight you will probably have to go to the emergency department Meds ordered this encounter  Medications  . traMADol (ULTRAM) 50 MG tablet    Sig: Take 1 tablet (50 mg total) by mouth every 6 (six) hours as needed.    Dispense:  10 tablet    Refill:  0    Order Specific Question:   Supervising Provider    Answer:   Elvina Sidle [[id number]]        Hayden Rasmussen, NP 07/23/16 1333

## 2016-07-23 NOTE — Discharge Instructions (Signed)
Leave the packing in for at least 24 hours. He may return tomorrow for packing removal and reassessment of the site of bleeding. He may take the tramadol for pain or sleep if needed. If bleeding restarts later today or tonight you will probably have to go to the emergency department

## 2016-07-23 NOTE — ED Triage Notes (Signed)
Pt here for nosebleed that has been bleeding since 7 am. sts that she had a nosebleed Saturday and Sunday

## 2016-07-24 ENCOUNTER — Encounter (HOSPITAL_COMMUNITY): Payer: Self-pay | Admitting: Emergency Medicine

## 2016-07-24 ENCOUNTER — Ambulatory Visit (HOSPITAL_COMMUNITY)
Admission: EM | Admit: 2016-07-24 | Discharge: 2016-07-24 | Disposition: A | Discharge status: Home / Self Care | Payer: BC Managed Care – PPO | Attending: Family Medicine | Admitting: Family Medicine

## 2016-07-24 DIAGNOSIS — R040 Epistaxis: Secondary | ICD-10-CM

## 2016-07-24 DIAGNOSIS — I10 Essential (primary) hypertension: Secondary | ICD-10-CM

## 2016-07-24 MED ORDER — AMLODIPINE BESYLATE 10 MG PO TABS: 10.0000 mg | ORAL_TABLET | Freq: Every day | ORAL | 3 refills | Status: DC

## 2016-07-24 NOTE — Discharge Instructions (Signed)
Please return tomorrow

## 2016-07-24 NOTE — ED Provider Notes (Signed)
[facility]    CSN: [id number] Arrival date & time: 07/24/16  1326     History   Chief Complaint Chief Complaint  Patient presents with  . Epistaxis    HPI Erica Ball is a 66 y.o. female.   This 66 year old woman who came in yesterday with epistaxis and was given a nasal packing. She was noted to be hypertensive at the time. She's had no further bleeding. She's tolerating the packing reasonably well now.  Patient was discharged on tramadol. She was given no antihypertensive medicine.  Patient works at [employer]      Past Medical History:  Diagnosis Date  . Allergy   . Cataract   . Hypertension     Patient Active Problem List   Diagnosis Date Noted  . Hypertension 10/30/2011    Priority: High  . Diverticulitis 10/30/2011    Priority: Medium  . Vitamin D deficiency 10/30/2011    Priority: Medium  . Tobacco abuse 08/20/2014    Past Surgical History:  Procedure Laterality Date  . ABDOMINAL HYSTERECTOMY    . CATARACT EXTRACTION    . FRACTURE SURGERY      OB History    No data available       Home Medications    Prior to Admission medications   Medication Sig Start Date End Date Taking? Authorizing Provider  amLODipine (NORVASC) 10 MG tablet Take 1 tablet (10 mg total) by mouth daily. 07/24/16   Elvina Sidle, MD    Family History Family History  Problem Relation Age of Onset  . Hypertension Mother   . Heart disease Mother   . Hypertension Father   . Heart disease Father   . Stroke Brother   . Hypertension Brother   . Heart disease Brother     Social History Social History  Substance Use Topics  . Smoking status: Current Every Day Smoker    Packs/day: 1.50    Types: Cigarettes  . Smokeless tobacco: Never Used  . Alcohol use 12.6 oz/week    21 Shots of liquor per week     Comment: drinks three cups of vodka per day     Allergies   Dextromethorphan   Review of  Systems Review of Systems  Constitutional: Negative.   HENT: Negative.   Respiratory: Negative.   Neurological: Negative.      Physical Exam Triage Vital Signs ED Triage Vitals  Enc Vitals Group     BP 07/24/16 1402 (!) 180/95     Pulse Rate 07/24/16 1402 98     Resp 07/24/16 1402 20     Temp 07/24/16 1402 98 F (36.7 C)     Temp Source 07/24/16 1402 Oral     SpO2 07/24/16 1402 98 %     Weight --      Height --      Head Circumference --      Peak Flow --      Pain Score 07/24/16 1403 0     Pain Loc --      Pain Edu? --      Excl. in GC? --    No data found.   Updated Vital Signs BP (!) 180/95 (BP Location: Right Arm)   Pulse 98   Temp 98 F (36.7 C) (Oral)   Resp 20   SpO2 98%    Physical Exam  Constitutional: She is oriented to person, place, and time. She appears well-developed and well-nourished.  HENT:  Right Ear: External ear normal.  Left Ear: External ear normal.  Mouth/Throat: Oropharynx is clear and moist.  Packing bilaterally in nose  Eyes: Conjunctivae and EOM are normal. Pupils are equal, round, and reactive to light.  Neck: Normal range of motion. Neck supple.  Pulmonary/Chest: Effort normal.  Musculoskeletal: Normal range of motion.  Neurological: She is alert and oriented to person, place, and time.  Skin: Skin is warm and dry.  Nursing note and vitals reviewed.    UC Treatments / Results  Labs (all labs ordered are listed, but only abnormal results are displayed) Labs Reviewed - No data to display  EKG  EKG Interpretation None       Radiology No results found.  Procedures Procedures (including critical care time)  Medications Ordered in UC Medications - No data to display   Initial Impression / Assessment and Plan / UC Course  I have reviewed the triage vital signs and the nursing notes.  Pertinent labs & imaging results that were available during my care of the patient were reviewed by me and considered in my medical  decision making (see chart for details).      Final Clinical Impressions(s) / UC Diagnoses   Final diagnoses:  Epistaxis  Essential hypertension    New Prescriptions Discharge Medication List as of 07/24/2016  2:23 PM    START taking these medications   Details  amLODipine (NORVASC) 10 MG tablet Take 1 tablet (10 mg total) by mouth daily., Starting Tue 07/24/2016, Normal         Elvina SidleKurt Aiman Sonn, MD 07/24/16 1430

## 2016-07-24 NOTE — ED Triage Notes (Signed)
[facility]

## 2016-07-25 ENCOUNTER — Ambulatory Visit (HOSPITAL_COMMUNITY)
Admission: EM | Admit: 2016-07-25 | Discharge: 2016-07-25 | Disposition: A | Discharge status: Home / Self Care | Payer: BC Managed Care – PPO | Attending: Emergency Medicine | Admitting: Emergency Medicine

## 2016-07-25 ENCOUNTER — Encounter (HOSPITAL_COMMUNITY): Payer: Self-pay | Admitting: *Deleted

## 2016-07-25 DIAGNOSIS — Z09 Encounter for follow-up examination after completed treatment for conditions other than malignant neoplasm: Secondary | ICD-10-CM

## 2016-07-25 DIAGNOSIS — I10 Essential (primary) hypertension: Secondary | ICD-10-CM | POA: Diagnosis not present

## 2016-07-25 DIAGNOSIS — Z4800 Encounter for change or removal of nonsurgical wound dressing: Secondary | ICD-10-CM | POA: Diagnosis not present

## 2016-07-25 NOTE — Discharge Instructions (Signed)
You may want to use Afrin nasal spray twice a day for the next 2 or 3 days. He may also use saline nasal spray to keep your mucous membranes moist. I believe this should help the nose to heal and keep her from having more bleeding. Make sure you are taking your blood pressure medicine daily.

## 2016-07-25 NOTE — ED Triage Notes (Signed)
Pt  Reports    She  Is  Here  For a  Packing  Removal       She  Has  A  Rhino  Rocket  In place     She  Is  Alert  And  Oriented

## 2016-07-25 NOTE — ED Provider Notes (Signed)
CSN: [id number]     Arrival date & time 07/25/16  1338 History   First MD Initiated Contact with Patient 07/25/16 1456     Chief Complaint  Patient presents with  . Follow-up   (Consider location/radiation/quality/duration/timing/severity/associated sxs/prior Treatment) 66 year old female returns the third consecutive day for epistaxis that was treated with packing 2 days ago. She returns to have the packing removed. She denies having any bleeding. She also denies chest pain or discomfort of any kind, no shortness of breath or swelling. She was placed on antihypertensives yesterday by the urgent care provider who was previously her PCP.      Past Medical History:  Diagnosis Date  . Allergy   . Cataract   . Hypertension    Past Surgical History:  Procedure Laterality Date  . ABDOMINAL HYSTERECTOMY    . CATARACT EXTRACTION    . FRACTURE SURGERY     Family History  Problem Relation Age of Onset  . Hypertension Mother   . Heart disease Mother   . Hypertension Father   . Heart disease Father   . Stroke Brother   . Hypertension Brother   . Heart disease Brother    Social History  Substance Use Topics  . Smoking status: Current Every Day Smoker    Packs/day: 1.50    Types: Cigarettes  . Smokeless tobacco: Never Used  . Alcohol use 12.6 oz/week    21 Shots of liquor per week     Comment: drinks three cups of vodka per day   OB History    No data available     Review of Systems  Constitutional: Negative.   HENT: Negative for postnasal drip and rhinorrhea.        No bleeding.  Respiratory: Negative.   Cardiovascular: Negative.   Gastrointestinal: Negative.   Neurological: Negative.   All other systems reviewed and are negative.   Allergies  Dextromethorphan  Home Medications   Prior to Admission medications   Medication Sig Start Date End Date Taking? Authorizing Provider  amLODipine (NORVASC) 10 MG tablet Take 1 tablet (10 mg total) by mouth daily. 07/24/16    Elvina SidleKurt Lauenstein, MD   Meds Ordered and Administered this Visit  Medications - No data to display  BP (!) 151/92 (BP Location: Right Arm)   Pulse (!) 120   Temp 97.7 F (36.5 C) (Oral)   Resp 18   SpO2 100%  No data found.   Physical Exam  Constitutional: She is oriented to person, place, and time. She appears well-developed and well-nourished. No distress.  HENT:  Nasal packing bilaterally intact and in place.  Eyes: EOM are normal.  Neck: Normal range of motion. Neck supple.  Cardiovascular: Normal rate and regular rhythm.  Exam reveals gallop.   Pulse rate during the exam was 100.  Pulmonary/Chest: Effort normal. No respiratory distress.  Musculoskeletal: She exhibits no edema.  Neurological: She is alert and oriented to person, place, and time. She exhibits normal muscle tone.  Skin: Skin is warm and dry.  Psychiatric: She has a normal mood and affect.  Nursing note and vitals reviewed.   Urgent Care Course     Procedures (including critical care time)  Labs Review Labs Reviewed - No data to display  Imaging Review No results found.   Visual Acuity Review  Right Eye Distance:   Left Eye Distance:   Bilateral Distance:    Right Eye Near:   Left Eye Near:    Bilateral Near:  MDM   1. Follow-up exam   2. Essential hypertension    You may want to use Afrin nasal spray twice a day for the next 2 or 3 days. He may also use saline nasal spray to keep your mucous membranes moist. I believe this should help the nose to heal and keep her from having more bleeding. Make sure you are taking your blood pressure medicine daily.  The nasal packing from both nostrils were removed. No bleeding. The patient states she feels well and she is related to go back to work.     Hayden Rasmussen, NP 07/25/16 1531

## 2017-07-10 ENCOUNTER — Emergency Department (HOSPITAL_COMMUNITY): Payer: BC Managed Care – PPO

## 2017-07-10 ENCOUNTER — Inpatient Hospital Stay (HOSPITAL_COMMUNITY)
Admission: EM | Admit: 2017-07-10 | Discharge: 2017-07-18 | DRG: 853 | Disposition: A | Discharge status: Home / Self Care | Payer: BC Managed Care – PPO | Attending: Family Medicine | Admitting: Family Medicine

## 2017-07-10 ENCOUNTER — Other Ambulatory Visit: Payer: Self-pay

## 2017-07-10 ENCOUNTER — Encounter (HOSPITAL_COMMUNITY): Payer: Self-pay

## 2017-07-10 DIAGNOSIS — N390 Urinary tract infection, site not specified: Secondary | ICD-10-CM | POA: Diagnosis present

## 2017-07-10 DIAGNOSIS — I2510 Atherosclerotic heart disease of native coronary artery without angina pectoris: Secondary | ICD-10-CM | POA: Diagnosis present

## 2017-07-10 DIAGNOSIS — Z9889 Other specified postprocedural states: Secondary | ICD-10-CM | POA: Diagnosis not present

## 2017-07-10 DIAGNOSIS — D539 Nutritional anemia, unspecified: Secondary | ICD-10-CM | POA: Diagnosis present

## 2017-07-10 DIAGNOSIS — I700 Atherosclerosis of aorta: Secondary | ICD-10-CM

## 2017-07-10 DIAGNOSIS — N179 Acute kidney failure, unspecified: Secondary | ICD-10-CM | POA: Diagnosis present

## 2017-07-10 DIAGNOSIS — A419 Sepsis, unspecified organism: Secondary | ICD-10-CM | POA: Diagnosis not present

## 2017-07-10 DIAGNOSIS — J918 Pleural effusion in other conditions classified elsewhere: Secondary | ICD-10-CM | POA: Diagnosis present

## 2017-07-10 DIAGNOSIS — J690 Pneumonitis due to inhalation of food and vomit: Secondary | ICD-10-CM | POA: Diagnosis not present

## 2017-07-10 DIAGNOSIS — R778 Other specified abnormalities of plasma proteins: Secondary | ICD-10-CM

## 2017-07-10 DIAGNOSIS — F17210 Nicotine dependence, cigarettes, uncomplicated: Secondary | ICD-10-CM | POA: Diagnosis present

## 2017-07-10 DIAGNOSIS — E876 Hypokalemia: Secondary | ICD-10-CM | POA: Diagnosis present

## 2017-07-10 DIAGNOSIS — Z9689 Presence of other specified functional implants: Secondary | ICD-10-CM

## 2017-07-10 DIAGNOSIS — J869 Pyothorax without fistula: Secondary | ICD-10-CM | POA: Diagnosis not present

## 2017-07-10 DIAGNOSIS — E871 Hypo-osmolality and hyponatremia: Secondary | ICD-10-CM | POA: Diagnosis not present

## 2017-07-10 DIAGNOSIS — Z720 Tobacco use: Secondary | ICD-10-CM | POA: Diagnosis present

## 2017-07-10 DIAGNOSIS — I471 Supraventricular tachycardia: Secondary | ICD-10-CM | POA: Diagnosis present

## 2017-07-10 DIAGNOSIS — I248 Other forms of acute ischemic heart disease: Secondary | ICD-10-CM | POA: Diagnosis present

## 2017-07-10 DIAGNOSIS — J90 Pleural effusion, not elsewhere classified: Secondary | ICD-10-CM | POA: Diagnosis present

## 2017-07-10 DIAGNOSIS — I10 Essential (primary) hypertension: Secondary | ICD-10-CM | POA: Diagnosis present

## 2017-07-10 DIAGNOSIS — R748 Abnormal levels of other serum enzymes: Secondary | ICD-10-CM

## 2017-07-10 DIAGNOSIS — R7989 Other specified abnormal findings of blood chemistry: Secondary | ICD-10-CM

## 2017-07-10 DIAGNOSIS — Z7982 Long term (current) use of aspirin: Secondary | ICD-10-CM

## 2017-07-10 DIAGNOSIS — J851 Abscess of lung with pneumonia: Secondary | ICD-10-CM | POA: Diagnosis present

## 2017-07-10 DIAGNOSIS — Z8249 Family history of ischemic heart disease and other diseases of the circulatory system: Secondary | ICD-10-CM | POA: Diagnosis not present

## 2017-07-10 DIAGNOSIS — F1010 Alcohol abuse, uncomplicated: Secondary | ICD-10-CM | POA: Diagnosis present

## 2017-07-10 DIAGNOSIS — Z823 Family history of stroke: Secondary | ICD-10-CM

## 2017-07-10 DIAGNOSIS — N3000 Acute cystitis without hematuria: Secondary | ICD-10-CM

## 2017-07-10 DIAGNOSIS — J181 Lobar pneumonia, unspecified organism: Secondary | ICD-10-CM | POA: Diagnosis not present

## 2017-07-10 DIAGNOSIS — J9601 Acute respiratory failure with hypoxia: Secondary | ICD-10-CM | POA: Diagnosis present

## 2017-07-10 DIAGNOSIS — I361 Nonrheumatic tricuspid (valve) insufficiency: Secondary | ICD-10-CM | POA: Diagnosis not present

## 2017-07-10 DIAGNOSIS — E860 Dehydration: Secondary | ICD-10-CM | POA: Diagnosis present

## 2017-07-10 HISTORY — DX: Sepsis, unspecified organism: A41.9

## 2017-07-10 HISTORY — DX: Pleural effusion, not elsewhere classified: J90

## 2017-07-10 HISTORY — DX: Urinary tract infection, site not specified: N39.0

## 2017-07-10 HISTORY — DX: Other specified abnormal findings of blood chemistry: R79.89

## 2017-07-10 HISTORY — DX: Tobacco use: Z72.0

## 2017-07-10 HISTORY — DX: Acute kidney failure, unspecified: N17.9

## 2017-07-10 HISTORY — DX: Alcohol abuse, uncomplicated: F10.10

## 2017-07-10 HISTORY — DX: Other specified abnormalities of plasma proteins: R77.8

## 2017-07-10 HISTORY — DX: Acute respiratory failure with hypoxia: J96.01

## 2017-07-10 HISTORY — DX: Lobar pneumonia, unspecified organism: J18.1

## 2017-07-10 LAB — COMPREHENSIVE METABOLIC PANEL
ALT: 7 U/L — ABNORMAL LOW (ref 14–54)
ANION GAP: 14 (ref 5–15)
AST: 12 U/L — AB (ref 15–41)
Albumin: 3 g/dL — ABNORMAL LOW (ref 3.5–5.0)
Alkaline Phosphatase: 78 U/L (ref 38–126)
BILIRUBIN TOTAL: 0.7 mg/dL (ref 0.3–1.2)
BUN: 19 mg/dL (ref 6–20)
CO2: 26 mmol/L (ref 22–32)
Calcium: 9 mg/dL (ref 8.9–10.3)
Chloride: 90 mmol/L — ABNORMAL LOW (ref 101–111)
Creatinine, Ser: 1.09 mg/dL — ABNORMAL HIGH (ref 0.44–1.00)
GFR calc Af Amer: 59 mL/min — ABNORMAL LOW (ref >60)
GFR calc non Af Amer: 51 mL/min — ABNORMAL LOW (ref >60)
Glucose, Bld: 122 mg/dL — ABNORMAL HIGH (ref 65–99)
POTASSIUM: 3.8 mmol/L (ref 3.5–5.1)
SODIUM: 130 mmol/L — AB (ref 135–145)
Total Protein: 6.7 g/dL (ref 6.5–8.1)

## 2017-07-10 LAB — CBC WITH DIFFERENTIAL/PLATELET
Basophils Absolute: 0 10*3/uL (ref 0.0–0.1)
Basophils Relative: 0 %
EOS PCT: 0 %
Eosinophils Absolute: 0 10*3/uL (ref 0.0–0.7)
HEMATOCRIT: 45.3 % (ref 36.0–46.0)
Hemoglobin: 15.8 g/dL — ABNORMAL HIGH (ref 12.0–15.0)
LYMPHS ABS: 1 10*3/uL (ref 0.7–4.0)
LYMPHS PCT: 5 %
MCH: 36.1 pg — AB (ref 26.0–34.0)
MCHC: 34.9 g/dL (ref 30.0–36.0)
MCV: 103.4 fL — AB (ref 78.0–100.0)
MONO ABS: 0.9 10*3/uL (ref 0.1–1.0)
MONOS PCT: 4 %
NEUTROS ABS: 19.4 10*3/uL — AB (ref 1.7–7.7)
Neutrophils Relative %: 91 %
PLATELETS: 314 10*3/uL (ref 150–400)
RBC: 4.38 MIL/uL (ref 3.87–5.11)
RDW: 12.5 % (ref 11.5–15.5)
WBC: 21.3 10*3/uL — ABNORMAL HIGH (ref 4.0–10.5)

## 2017-07-10 LAB — URINALYSIS, ROUTINE W REFLEX MICROSCOPIC
Bilirubin Urine: NEGATIVE
GLUCOSE, UA: NEGATIVE mg/dL
Ketones, ur: NEGATIVE mg/dL
NITRITE: NEGATIVE
PROTEIN: 30 mg/dL — AB
SPECIFIC GRAVITY, URINE: 1.02 (ref 1.005–1.030)
pH: 5 (ref 5.0–8.0)

## 2017-07-10 LAB — PROTIME-INR
INR: 1.07
Prothrombin Time: 13.9 seconds (ref 11.4–15.2)

## 2017-07-10 LAB — I-STAT CG4 LACTIC ACID, ED
LACTIC ACID, VENOUS: 2.42 mmol/L — AB (ref 0.5–1.9)
Lactic Acid, Venous: 2.51 mmol/L (ref 0.5–1.9)

## 2017-07-10 LAB — APTT: aPTT: 33 seconds (ref 24–36)

## 2017-07-10 LAB — TROPONIN I
Troponin I: 0.49 ng/mL (ref <0.03)
Troponin I: 0.55 ng/mL (ref <0.03)

## 2017-07-10 LAB — INFLUENZA PANEL BY PCR (TYPE A & B)
Influenza A By PCR: NEGATIVE
Influenza B By PCR: NEGATIVE

## 2017-07-10 LAB — PROCALCITONIN: Procalcitonin: 1.21 ng/mL

## 2017-07-10 LAB — BRAIN NATRIURETIC PEPTIDE: B Natriuretic Peptide: 176.3 pg/mL — ABNORMAL HIGH (ref 0.0–100.0)

## 2017-07-10 MED ORDER — HYDROCODONE-ACETAMINOPHEN 5-325 MG PO TABS: 1.0000 | ORAL_TABLET | Freq: Once | ORAL | Status: DC

## 2017-07-10 MED ORDER — ONDANSETRON HCL 4 MG/2ML IJ SOLN: 4.0000 mg | Freq: Three times a day (TID) | INTRAMUSCULAR | Status: DC | PRN

## 2017-07-10 MED ORDER — SODIUM CHLORIDE 0.9 % IV BOLUS (SEPSIS)
1000.0000 mL | Freq: Once | INTRAVENOUS | Status: AC
  Administered 2017-07-10: 1000 mL via INTRAVENOUS

## 2017-07-10 MED ORDER — SODIUM CHLORIDE 0.9 % IV BOLUS (SEPSIS)
1000.0000 mL | Freq: Once | INTRAVENOUS | Status: AC
Start: 2017-07-10 — End: 2017-07-10
  Administered 2017-07-10: 1000 mL via INTRAVENOUS

## 2017-07-10 MED ORDER — AZITHROMYCIN 500 MG IV SOLR
500.0000 mg | Freq: Once | INTRAVENOUS | Status: AC
  Administered 2017-07-10: 500 mg via INTRAVENOUS
  Filled 2017-07-10: qty 500

## 2017-07-10 MED ORDER — LORAZEPAM 2 MG/ML IJ SOLN: 1.0000 mg | Freq: Four times a day (QID) | INTRAMUSCULAR | Status: AC | PRN

## 2017-07-10 MED ORDER — SODIUM CHLORIDE 0.9 % IV SOLN
500.0000 mg | INTRAVENOUS | Status: DC
  Administered 2017-07-11 – 2017-07-14 (×3): 500 mg via INTRAVENOUS
  Filled 2017-07-10 (×4): qty 500

## 2017-07-10 MED ORDER — ADULT MULTIVITAMIN W/MINERALS CH
1.0000 | ORAL_TABLET | Freq: Every day | ORAL | Status: DC
  Administered 2017-07-11 – 2017-07-18 (×8): 1 via ORAL
  Filled 2017-07-10 (×8): qty 1

## 2017-07-10 MED ORDER — MORPHINE SULFATE (PF) 4 MG/ML IV SOLN
2.0000 mg | INTRAVENOUS | Status: DC | PRN
  Administered 2017-07-10 – 2017-07-12 (×6): 2 mg via INTRAVENOUS
  Filled 2017-07-10 (×6): qty 1

## 2017-07-10 MED ORDER — ATORVASTATIN CALCIUM 40 MG PO TABS
40.0000 mg | ORAL_TABLET | Freq: Every day | ORAL | Status: DC
  Administered 2017-07-11 – 2017-07-17 (×7): 40 mg via ORAL
  Filled 2017-07-10 (×3): qty 1
  Filled 2017-07-10: qty 2
  Filled 2017-07-10 (×3): qty 1

## 2017-07-10 MED ORDER — HYDRALAZINE HCL 20 MG/ML IJ SOLN: 5.0000 mg | INTRAMUSCULAR | Status: DC | PRN

## 2017-07-10 MED ORDER — MORPHINE SULFATE (PF) 4 MG/ML IV SOLN
4.0000 mg | Freq: Once | INTRAVENOUS | Status: AC
  Administered 2017-07-10: 4 mg via INTRAVENOUS
  Filled 2017-07-10: qty 1

## 2017-07-10 MED ORDER — ACETAMINOPHEN 325 MG PO TABS
650.0000 mg | ORAL_TABLET | Freq: Four times a day (QID) | ORAL | Status: DC | PRN
  Administered 2017-07-11: 650 mg via ORAL
  Filled 2017-07-10: qty 2

## 2017-07-10 MED ORDER — LEVALBUTEROL HCL 1.25 MG/0.5ML IN NEBU
1.2500 mg | INHALATION_SOLUTION | Freq: Three times a day (TID) | RESPIRATORY_TRACT | Status: DC
  Administered 2017-07-11 (×3): 1.25 mg via RESPIRATORY_TRACT
  Filled 2017-07-10 (×4): qty 0.5

## 2017-07-10 MED ORDER — SODIUM CHLORIDE 0.9 % IV SOLN
1.0000 g | INTRAVENOUS | Status: DC
  Filled 2017-07-10: qty 10

## 2017-07-10 MED ORDER — ZOLPIDEM TARTRATE 5 MG PO TABS
5.0000 mg | ORAL_TABLET | Freq: Every evening | ORAL | Status: DC | PRN
  Filled 2017-07-10: qty 1

## 2017-07-10 MED ORDER — LEVALBUTEROL HCL 1.25 MG/0.5ML IN NEBU: 1.2500 mg | INHALATION_SOLUTION | Freq: Four times a day (QID) | RESPIRATORY_TRACT | Status: DC

## 2017-07-10 MED ORDER — MORPHINE SULFATE (PF) 4 MG/ML IV SOLN
4.0000 mg | Freq: Once | INTRAVENOUS | Status: DC
  Filled 2017-07-10: qty 1

## 2017-07-10 MED ORDER — NITROGLYCERIN 0.4 MG SL SUBL: 0.4000 mg | SUBLINGUAL_TABLET | SUBLINGUAL | Status: DC | PRN

## 2017-07-10 MED ORDER — FOLIC ACID 1 MG PO TABS
1.0000 mg | ORAL_TABLET | Freq: Every day | ORAL | Status: DC
  Administered 2017-07-11 – 2017-07-18 (×8): 1 mg via ORAL
  Filled 2017-07-10 (×8): qty 1

## 2017-07-10 MED ORDER — SODIUM CHLORIDE 0.9 % IV SOLN
1.0000 g | Freq: Once | INTRAVENOUS | Status: AC
  Administered 2017-07-10: 1 g via INTRAVENOUS
  Filled 2017-07-10: qty 10

## 2017-07-10 MED ORDER — VITAMIN B-1 100 MG PO TABS
100.0000 mg | ORAL_TABLET | Freq: Every day | ORAL | Status: DC
  Administered 2017-07-11 – 2017-07-18 (×8): 100 mg via ORAL
  Filled 2017-07-10 (×8): qty 1

## 2017-07-10 MED ORDER — LORAZEPAM 2 MG/ML IJ SOLN: 0.0000 mg | Freq: Two times a day (BID) | INTRAMUSCULAR | Status: AC

## 2017-07-10 MED ORDER — NICOTINE 21 MG/24HR TD PT24
21.0000 mg | MEDICATED_PATCH | Freq: Every day | TRANSDERMAL | Status: DC
  Filled 2017-07-10 (×2): qty 1

## 2017-07-10 MED ORDER — IOPAMIDOL (ISOVUE-370) INJECTION 76%
INTRAVENOUS | Status: AC
  Filled 2017-07-10: qty 100

## 2017-07-10 MED ORDER — IOPAMIDOL (ISOVUE-370) INJECTION 76%
INTRAVENOUS | Status: AC
  Administered 2017-07-10: 100 mL
  Administered 2017-07-11
  Filled 2017-07-10: qty 100

## 2017-07-10 MED ORDER — ASPIRIN-ACETAMINOPHEN-CAFFEINE 250-250-65 MG PO TABS: 1.0000 | ORAL_TABLET | Freq: Every day | ORAL | Status: DC | PRN

## 2017-07-10 MED ORDER — LORAZEPAM 1 MG PO TABS: 1.0000 mg | ORAL_TABLET | Freq: Four times a day (QID) | ORAL | Status: AC | PRN

## 2017-07-10 MED ORDER — GUAIFENESIN-CODEINE 100-10 MG/5ML PO SOLN
5.0000 mL | ORAL | Status: DC | PRN
  Administered 2017-07-11: 5 mL via ORAL
  Filled 2017-07-10: qty 5

## 2017-07-10 MED ORDER — LORAZEPAM 2 MG/ML IJ SOLN: 0.0000 mg | Freq: Four times a day (QID) | INTRAMUSCULAR | Status: AC

## 2017-07-10 MED ORDER — THIAMINE HCL 100 MG/ML IJ SOLN: 100.0000 mg | Freq: Every day | INTRAMUSCULAR | Status: DC

## 2017-07-10 MED ORDER — SODIUM CHLORIDE 0.9 % IV SOLN
1000.0000 mL | INTRAVENOUS | Status: DC
  Administered 2017-07-10 – 2017-07-12 (×4): 1000 mL via INTRAVENOUS

## 2017-07-10 NOTE — ED Notes (Signed)
Radiology at bedside for CXR

## 2017-07-10 NOTE — H&P (Addendum)
History and Physical    Erica AlimentKathleen M Parzych WUJ:[id number] DOB: 1951-04-04 DOA: 07/10/2017  Referring MD/NP/PA:   PCP: Patient, No Pcp Per   Patient coming from:  The patient is coming from home.  At baseline, pt is independent for most of ADL.  Chief Complaint: Shortness of breath, cough, chest pain and generalized weakness  HPI: Erica Ball is a 67 y.o. female with medical history significant of tobacco and alcohol abuse, hypertension, who presents with shortness breath, cough, chest pain and generalized weakness.  Patient states that she has been having dry cough and SOB in the past 4 days, which has been progressively getting worse. She has right sided chest pain, which is constant, sharp, 10 out of 10 in severity, radiating to the right flank area . It is pleuritic, aggravated by deep breath. No hemoptysis. No tenderness in calf areas. Patient does not have fever or chills. Patient denies nausea, vomiting, diarrhea, abdominal pain, symptoms of UTI or unilateral weakness. Pt was seen by PCP today. This is her first visit with this provider. CXR was taken and she was called back by PCP, instructed to come to ED due to "fluid in lung". Patient reports smoking a pack a day for 48 years and drinks vodka every daily. Pt can barely speak in full sentence.  ED Course: pt was found to have WBC 21.3, troponin 0.49, lactic acid 2.42, 2.51, BNP 176, positive urinalysis with moderate amount of leukocyte, acute renal injury with creatinine 1.09, sodium 130, temperature 90.9, tachycardia, tachypnea, oxygen saturation 81% on room air, chest x-ray showed right-sided basilar opacity. Pt is admitted to tele bed as inpt. Card will be consulted by EDP  CT an# giogram of chest is negative for PE, but showed 1. a large right-sided pleural effusion with complete atelectasis of the right lower lobe and partial atelectasis of the inferior right middle lobe. Of note, the right lower lobe bronchus is completely  obstructed by what appears to be secretions/mucus plugging. An endobronchial lesion is considered less likely but not excluded. 2. Smaller loculated pleural effusion at the posterior aspect of the right apex. 3. Enlarged main pulmonary artery as can be seen in the setting of pulmonary arterial hypertension. 4.  Aortic Atherosclerosis (ICD10-170.0). 5. Coronary artery calcifications.   Review of Systems:   General: no fevers, chills, no body weight gain, has poor appetite, has fatigue HEENT: no blurry vision, hearing changes or sore throat Respiratory: has dyspnea, coughing, no wheezing CV: no chest pain, no palpitations GI: no nausea, vomiting, abdominal pain, diarrhea, constipation GU: no dysuria, burning on urination, increased urinary frequency, hematuria  Ext: no leg edema Neuro: no unilateral weakness, numbness, or tingling, no vision change or hearing loss Skin: no rash, no skin tear. MSK: No muscle spasm, no deformity, no limitation of range of movement in spin Heme: No easy bruising.  Travel history: No recent long distant travel.  Allergy:  Allergies  Allergen Reactions  . Dextromethorphan Other (See Comments)    Makes her very angry and she wants to hurt people    Past Medical History:  Diagnosis Date  . Alcohol abuse   . Allergy   . Cataract   . Hypertension   . Tobacco abuse     Past Surgical History:  Procedure Laterality Date  . ABDOMINAL HYSTERECTOMY    . CATARACT EXTRACTION    . FRACTURE SURGERY      Social History:  reports that she has been smoking cigarettes.  She has been  smoking about 1.50 packs per day. she has never used smokeless tobacco. She reports that she drinks about 12.6 oz of alcohol per week. She reports that she does not use drugs.  Family History:  Family History  Problem Relation Age of Onset  . Hypertension Mother   . Heart disease Mother   . Hypertension Father   . Heart disease Father   . Stroke Brother   . Hypertension  Brother   . Heart disease Brother      Prior to Admission medications   Medication Sig Start Date End Date Taking? Authorizing Provider  amLODipine (NORVASC) 10 MG tablet Take 1 tablet (10 mg total) by mouth daily. 07/24/16   Elvina Sidle, MD    Physical Exam: Vitals:   07/10/17 1812 07/10/17 1815 07/10/17 1900 07/10/17 2129  BP: 120/79  130/70   Pulse: (!) 105     Resp: 18 19 (!) 22   Temp:      TempSrc:      SpO2: 94%   94%  Weight:      Height:       General: Not in acute distress HEENT:       Eyes: PERRL, EOMI, no scleral icterus.       ENT: No discharge from the ears and nose, no pharynx injection, no tonsillar enlargement.        Neck: No JVD, no bruit, no mass felt. Heme: No neck lymph node enlargement. Cardiac: S1/S2, RRR, No murmurs, No gallops or rubs. Respiratory: has decreased air movement on the right side. No rales, wheezing, rhonchi or rubs. GI: Soft, nondistended, nontender, no rebound pain, no organomegaly, BS present. GU: No hematuria Ext: No pitting leg edema bilaterally. 2+DP/PT pulse bilaterally. Musculoskeletal: No joint deformities, No joint redness or warmth, no limitation of ROM in spin. Skin: No rashes.  Neuro: Alert, oriented X3, cranial nerves II-XII grossly intact, moves all extremities normally.  Psych: Patient is not psychotic, no suicidal or hemocidal ideation.  Labs on Admission: I have personally reviewed following labs and imaging studies  CBC: Recent Labs  Lab 07/10/17 1625  WBC 21.3*  NEUTROABS 19.4*  HGB 15.8*  HCT 45.3  MCV 103.4*  PLT 314   Basic Metabolic Panel: Recent Labs  Lab 07/10/17 1625  NA 130*  K 3.8  CL 90*  CO2 26  GLUCOSE 122*  BUN 19  CREATININE 1.09*  CALCIUM 9.0   GFR: Estimated Creatinine Clearance: 46.2 mL/min (A) (by C-G formula based on SCr of 1.09 mg/dL (H)). Liver Function Tests: Recent Labs  Lab 07/10/17 1625  AST 12*  ALT 7*  ALKPHOS 78  BILITOT 0.7  PROT 6.7  ALBUMIN 3.0*    No results for input(s): LIPASE, AMYLASE in the last 168 hours. No results for input(s): AMMONIA in the last 168 hours. Coagulation Profile: Recent Labs  Lab 07/10/17 2110  INR 1.07   Cardiac Enzymes: Recent Labs  Lab 07/10/17 1736  TROPONINI 0.49*   BNP (last 3 results) No results for input(s): PROBNP in the last 8760 hours. HbA1C: No results for input(s): HGBA1C in the last 72 hours. CBG: No results for input(s): GLUCAP in the last 168 hours. Lipid Profile: No results for input(s): CHOL, HDL, LDLCALC, TRIG, CHOLHDL, LDLDIRECT in the last 72 hours. Thyroid Function Tests: No results for input(s): TSH, T4TOTAL, FREET4, T3FREE, THYROIDAB in the last 72 hours. Anemia Panel: No results for input(s): VITAMINB12, FOLATE, FERRITIN, TIBC, IRON, RETICCTPCT in the last 72 hours. Urine analysis:  Component Value Date/Time   COLORURINE AMBER (A) 07/10/2017 1839   APPEARANCEUR CLOUDY (A) 07/10/2017 1839   LABSPEC 1.020 07/10/2017 1839   PHURINE 5.0 07/10/2017 1839   GLUCOSEU NEGATIVE 07/10/2017 1839   HGBUR SMALL (A) 07/10/2017 1839   BILIRUBINUR NEGATIVE 07/10/2017 1839   BILIRUBINUR neg 10/11/2011 1425   KETONESUR NEGATIVE 07/10/2017 1839   PROTEINUR 30 (A) 07/10/2017 1839   UROBILINOGEN 0.2 10/11/2011 1425   UROBILINOGEN 0.2 01/24/2008 2217   NITRITE NEGATIVE 07/10/2017 1839   LEUKOCYTESUR MODERATE (A) 07/10/2017 1839   Sepsis Labs: @LABRCNTIP (procalcitonin:4,lacticidven:4) ) Recent Results (from the past 240 hour(s))  Blood Culture (routine x 2)     Status: None (Preliminary result)   Collection Time: 07/10/17  4:34 PM  Result Value Ref Range Status   Specimen Description BLOOD RIGHT ANTECUBITAL  Final   Special Requests   Final    BOTTLES DRAWN AEROBIC AND ANAEROBIC Blood Culture adequate volume   Culture PENDING  Incomplete   Report Status PENDING  Incomplete     Radiological Exams on Admission: Ct Angio Chest Pe W/cm &/or Wo Cm  Result Date:  07/10/2017 CLINICAL DATA:  67 year old female with right-sided chest pain, suspect pulmonary embolus EXAM: CT ANGIOGRAPHY CHEST WITH CONTRAST TECHNIQUE: Multidetector CT imaging of the chest was performed using the standard protocol during bolus administration of intravenous contrast. Multiplanar CT image reconstructions and MIPs were obtained to evaluate the vascular anatomy. CONTRAST:  100 mL Isovue 370 COMPARISON:  Chest x-ray obtained earlier today; prior CT scan of the chest 10/01/2004 FINDINGS: Cardiovascular: Satisfactory opacification of the pulmonary arteries to the segmental level. No evidence of pulmonary embolism. Normal heart size. No pericardial effusion. The main pulmonary artery is enlarged at 3.3 cm. Trace aortic atherosclerotic calcifications. Calcifications are present throughout the coronary arteries. The heart is normal in size. No pericardial effusion. Mediastinum/Nodes: Unremarkable CT appearance of the thyroid gland. No suspicious mediastinal or hilar adenopathy. No soft tissue mediastinal mass. The thoracic esophagus is unremarkable. Lungs/Pleura: Large right-sided pleural effusion with associated atelectasis of the entire right lower lobe and a portion of the inferior right middle lobe. There is a smaller loculated pleural effusion at the right lung apex. Complete occlusion of the right lower lobe bronchus with what appears to be secretions. No definite enhancing mass. Mild elevation of the left hemidiaphragm. Subsegmental left lower lobe atelectasis. Upper Abdomen: No acute abnormality. Musculoskeletal: No chest wall abnormality. No acute or significant osseous findings. Review of the MIP images confirms the above findings. IMPRESSION: 1. Large right-sided pleural effusion with complete atelectasis of the right lower lobe and partial atelectasis of the inferior right middle lobe. Of note, the right lower lobe bronchus is completely obstructed by what appears to be secretions/mucus plugging.  An endobronchial lesion is considered less likely but not excluded. The pleural effusion may be secondary to chronic atelectasis from this obstructed bronchus rather than the primary pathology. Recommend referral to pulmonology for bronchoscopy. 2. Smaller loculated pleural effusion at the posterior aspect of the right apex. 3. Enlarged main pulmonary artery as can be seen in the setting of pulmonary arterial hypertension. 4.  Aortic Atherosclerosis (ICD10-170.0). 5. Coronary artery calcifications. Electronically Signed   By: Malachy Moan M.D.   On: 07/10/2017 19:41   Dg Chest Port 1 View  Result Date: 07/10/2017 CLINICAL DATA:  67 year old female with right chest and back pain EXAM: PORTABLE CHEST 1 VIEW COMPARISON:  Prior chest x-ray 10/30/2011 FINDINGS: Dense right basilar opacity with complete obscure a shin  of the right hemidiaphragm. Veil like opacity in the right mid lung. The left lung appears clear. The cardiac mediastinal contours are grossly within normal limits. Atherosclerotic calcification is present in the transverse aorta. No pneumothorax. IMPRESSION: Dense right basilar opacity likely represents a combination of pleural fluid with either atelectasis or infiltrate/pneumonia. An underlying pulmonary nodule or mass is difficult to exclude radiographically. Aortic Atherosclerosis (ICD10-170.0) Electronically Signed   By: Malachy Moan M.D.   On: 07/10/2017 18:07     EKG: Independently reviewed. sinus rhythm, QTC 423, early R-wave progression, nonspecific T-wave change.    Assessment/Plan Principal Problem:   Acute respiratory failure with hypoxia (HCC) Active Problems:   Hypertension   Tobacco abuse   Elevated troponin   Pleural effusion on right   UTI (urinary tract infection)   Sepsis (HCC)   Lobar pneumonia (HCC)   Acute kidney injury (HCC)   Alcohol abuse   Acute respiratory failure with hypoxia: due to large R-sided pleural effusion, right lower lobe bronchus  obstruction and possible lobar pneumonia. An endobronchial lesion cannot be completely excluded.  - will admit to tele bed as inpt - IV Rocephin and azithromycin - Xopenex Neb prn for SOB - guaiFENesin-codeine for cough - Urine legionella and S. pneumococcal antigen - Follow up blood culture x2, sputum culture and plus Flu pcr - will get Procalcitonin and trend lactic acid level per sepsis protocol - IVF: 2L of NS bolus in ED, followed by per hour of NS - please call IR for thoracentesis in morning - May need to consult pulmonologist  UTI: UA is positive with moderate leukocytes. Pt is asymptomatic, but has AKi -on rocephin -f/u Ux  Sepsis: Patient meets criteria for sepsis with leukocytosis, tachycardia and tachypnea. Lactic acid is elevated at 2.42, 2.51. Currently hemodynamically stable. This is due to UTI and possible lobar PAN.  -on Abx as above -will get Procalcitonin and trend lactic acid levels per sepsis protocol. -IVF: 2L of NS bolus in ED, followed by 100 cc/h   HTN: -hold amlodipine since pt is at risk of developing hypotension 2/2 sepsis -PRN hydralazine.  Tobacco abuse and Alcohol abuse: -Did counseling about importance of quitting smoking -Nicotine patch -Did counseling about the importance of quitting drinking -CIWA protocol  AKI: Likely due to prerenal secondary to dehydration and UTI. - IVF as above - Follow up renal function by BMP  Elevated troponin: trop 0.49. Pt has pleuritic chest pain, which is likely due to pleural effusion and pneumonia. Likely due to demand ischemia secondary to sepsis and ongoing infection. EDP spoke with Dr. Harlow Mares of Cardiology, "who recommends only to consider ACS as a pathology if patient is having active chest pain with an elevated second troponin.  Feels that this is demand ischemia at this time.  He reviewed the EKG and does not feel that the elevated troponin is secondary to ACS at this time". - cycle CE q6 x3 and repeat  EKG in the am  - prn Nitroglycerin, Morphine - start lipitor  - pt is on Aspirin-Salicylamide-Caffeine  - Risk factor stratification: will check FLP, UDS and A1C  - 2d echo   DVT ppx: SCD Code Status: Full code Family Communication:  Yes, patient's sister and brother   at bed side Disposition Plan:  Anticipate discharge back to previous home environment Consults called:  Card, Dr. Cristina Gong Admission status: Inpatient/tele  Date of Service 07/10/2017    Lorretta Harp [facility] Pager [phone]  If 7PM-7AM, please contact night-coverage  [email or web address] Password [facility] 07/10/2017, 9:42 PM

## 2017-07-10 NOTE — ED Triage Notes (Signed)
Pt endorses being sent here for pneumonia by her pcp. Pt has had congestion, cough since Sunday night and right side pain. Spo2 81% on RA in triage, placed on 3L [place].

## 2017-07-10 NOTE — ED Notes (Signed)
Informed first nurse Chelsea of lactic acid 2.42

## 2017-07-10 NOTE — ED Notes (Signed)
Patient transported to CT 

## 2017-07-10 NOTE — ED Notes (Signed)
Pt returned from CT °

## 2017-07-10 NOTE — ED Provider Notes (Addendum)
[facility] Provider Note   CSN: [id number] Arrival date & time: 07/10/17  1525     History   Chief Complaint Chief Complaint  Patient presents with  . Pneumonia    HPI Erica Ball is a 67 y.o. female.  HPI  Patient is a 67 year old female with a history of hypertension, tobacco use, and diverticulitis presenting for shortness of breath  in an abnormal chest x-ray at her PCP office this morning.  Patient notes 3 days ago.  Patient denies fever or chills at this time.  Patient reports she has been congested and has had a nonproductive cough.  No hemoptysis.  Patient reports that the right-sided chest pain has been progressive and radiating into the back.  Reports that she first began feeling short of breath with right-sided chest pain on breathing Patient denies it clearly reproducible with palpation.  Patient denies diaphoresis, presyncope or syncope, left-sided chest pain, increased lower extremity edema.  Patient denies any history of COPD, asthma, or heart disease.  Patient denies any unilateral leg swelling, recent immobilization or hospitalization, estrogen use, history of cancer, or history of DVT/PE.  Patient reports that her PCP visit today was her first visit with this provider.  Patient reports she was prescribed an inhaler, and chest x-ray was taken and she was called back and instructed to come to the emergency department due to unclear changes to the chest x-ray that were concerning for pneumonia or fluid on the lungs per patient.  Patient reports smoking a pack a day for 48  years.  Patient denies O2 at home at baseline.  Past Medical History:  Diagnosis Date  . Allergy   . Cataract   . Hypertension     Patient Active Problem List   Diagnosis Date Noted  . Tobacco abuse 08/20/2014  . Hypertension 10/30/2011  . Diverticulitis 10/30/2011  . Vitamin D deficiency 10/30/2011    Past Surgical History:  Procedure Laterality  Date  . ABDOMINAL HYSTERECTOMY    . CATARACT EXTRACTION    . FRACTURE SURGERY      OB History    No data available       Home Medications    Prior to Admission medications   Medication Sig Start Date End Date Taking? Authorizing Provider  amLODipine (NORVASC) 10 MG tablet Take 1 tablet (10 mg total) by mouth daily. 07/24/16   [name], [name], MD    Family History Family History  Problem Relation Age of Onset  . Hypertension Mother   . Heart disease Mother   . Hypertension Father   . Heart disease Father   . Stroke Brother   . Hypertension Brother   . Heart disease Brother     Social History Social History   Tobacco Use  . Smoking status: Current Every Day Smoker    Packs/day: 1.50    Types: Cigarettes  . Smokeless tobacco: Never Used  Substance Use Topics  . Alcohol use: Yes    Alcohol/week: 12.6 oz    Types: 21 Shots of liquor per week    Comment: drinks three cups of vodka per day  . Drug use: No     Allergies   Dextromethorphan   Review of Systems Review of Systems  Constitutional: Negative for chills and fever.  HENT: Positive for congestion. Negative for rhinorrhea.   Respiratory: Positive for cough, chest tightness and shortness of breath.   Cardiovascular: Positive for chest pain. Negative for leg swelling.  Gastrointestinal: Negative  for abdominal pain, nausea and vomiting.  Musculoskeletal: Positive for back pain.  Skin: Negative for rash.  Neurological: Negative for syncope and light-headedness.  All other systems reviewed and are negative.    Physical Exam Updated Vital Signs BP 134/85 (BP Location: Right Arm)   Pulse (!) 113   Temp 99 F (37.2 C) (Oral)   Resp (!) 22   Ht 5\' 6"  (1.676 m)   Wt 68 kg (150 lb)   SpO2 (!) 81%   BMI 24.21 kg/m   Physical Exam  Constitutional: She appears well-developed and well-nourished. No distress.  Ill-appearing and appearing older than stated age.  HENT:  Head: Normocephalic and  atraumatic.  Mouth/Throat: Oropharynx is clear and moist.  Eyes: Conjunctivae and EOM are normal. Pupils are equal, round, and reactive to light.  Neck: Normal range of motion. Neck supple.  Cardiovascular: Normal rate, regular rhythm, S1 normal and S2 normal.  No murmur heard. Pulmonary/Chest: She has no wheezes. She has rales.  Tachypnea and increased work of breathing.  No prolonged expiratory phase.  Lung sounds diminished on right side.  Coarse crackles in bilateral lung bases.  Abdominal: Soft. She exhibits no distension.  Musculoskeletal: Normal range of motion. She exhibits no edema or deformity.  Lymphadenopathy:    She has no cervical adenopathy.  Neurological: She is alert.  Cranial nerves grossly intact. Patient moves extremities symmetrically and with good coordination.  Skin: Skin is warm and dry. No rash noted. No erythema.  Psychiatric: She has a normal mood and affect. Her behavior is normal. Judgment and thought content normal.  Nursing note and vitals reviewed.    ED Treatments / Results  Labs (all labs ordered are listed, but only abnormal results are displayed) Labs Reviewed  CBC WITH DIFFERENTIAL/PLATELET - Abnormal; Notable for the following components:      Result Value   WBC 21.3 (*)    Hemoglobin 15.8 (*)    MCV 103.4 (*)    MCH 36.1 (*)    Neutro Abs 19.4 (*)    All other components within normal limits  I-STAT CG4 LACTIC ACID, ED - Abnormal; Notable for the following components:   Lactic Acid, Venous 2.42 (*)    All other components within normal limits  COMPREHENSIVE METABOLIC PANEL  BRAIN NATRIURETIC PEPTIDE  TROPONIN I    EKG  EKG Interpretation None       Radiology No results found.  Procedures Procedures (including critical care time)  CRITICAL CARE Performed by: Elisha Ponder   Total critical care time: 35 minutes (presumed pneumonia with pulse >120)  Critical care time was exclusive of separately billable procedures and  treating other patients.  Critical care was necessary to treat or prevent imminent or life-threatening deterioration.  Critical care was time spent personally by me on the following activities: development of treatment plan with patient and/or surrogate as well as nursing, discussions with consultants, evaluation of patient's response to treatment, examination of patient, obtaining history from patient or surrogate, ordering and performing treatments and interventions, ordering and review of laboratory studies, ordering and review of radiographic studies, pulse oximetry and re-evaluation of patient's condition.   Medications Ordered in ED Medications  morphine 4 MG/ML injection 4 mg (not administered)     Initial Impression / Assessment and Plan / ED Course  I have reviewed the triage vital signs and the nursing notes.  Pertinent labs & imaging results that were available during my care of the patient were reviewed by me  and considered in my medical decision making (see chart for details).  Clinical Course as of Jul 10 2009  Wed Jul 10, 2017  1812 Patient reassessed.  Patient breathing comfortably at rest, and oxygen saturation 94% on room air.  Will assess right-sided airspace opacity was CTPA at this time.  Fluids ordered.  Patient received 1 L normal saline as well as maintenance fluids.  Vital signs are stable. Abx for CAP started at this time.  [AM]    Clinical Course User Index [AM] Elisha Ponder, PA-C    5:30 PM Patient is ill-appearing and appearing to have increased work of breathing.  Patient is not in respiratory distress at this time.  Oxygen saturation 93-95% at rest on 3 L of O2.  Initial oxygen saturation noted in triage was 81%.  Patient with normal mental status.  This is a new oxygen requirement for patient.  Differential diagnosis includes pneumothorax, pneumonia, pulmonary embolism, ACS will obtain chest x-ray 1 view to assess for.  Pneumothorax, anticipate needing  CTPA to rule out PE.  Will obtain troponin,   CBC, CMP.  Patient has a lactic acid of 2.42.  Leukocytosis of 21.3 with left shift.  Mild hyponatremia of 130 noted.  Given tachycardia, and tachypnea, cannot rule out sepsis at this time.  Will obtain rectal temperature.  Case discussed with Dr. Loren Racer, who will independently evaluate the patient  Lab work is significant for a troponin of 0.49.  Will order delta troponin.  EKG to be collected at the time of delta troponin.  Additionally, lactic acid repeated is 2.51.  Patient does have a liter bolus as well as maintenance fluid ordered.  Patient also with be an P of 176.3.  Patient exhibits creatinine 1.09, which is up from baseline, which has not been assessed since 2013.  See values below.  CT of the chest, reviewed by me, demonstrates no evidence of pulmonary embolism, but right-sided pleural effusion with complete atelectasis of the right lower lobe and partial atelectasis of the inferior right middle lobe of note, the right lower lobe bronchus complete obstructed by what may be secretions or mucous plugging.  Per radiology, pulmonology consult recommended.  Lab Results  Component Value Date   CREATININE 1.09 (H) 07/10/2017   CREATININE 0.51 10/11/2011   CREATININE 0.85 01/24/2008   8:34 PM Spoke with Dr. Clyde Lundborg, hospitalist, who waits recommendations of cardiology.  Will admit patient.  9:22 PM Spoke with Dr. Harlow Mares of Cardiology, who recommends only to consider ACS as a pathology if patient is having active chest pain with an elevated second troponin.  Feels that this is demand ischemia at this time.  He reviewed the EKG and does not feel that the elevated troponin is secondary to ACS at this time.  Final Clinical Impressions(s) / ED Diagnoses   Final diagnoses:  Aortic atherosclerosis (HCC)  Pleural effusion on right  Acute kidney injury [facility])    ED Discharge Orders    None       Delia Chimes 07/10/17 2136      Loren Racer, MD 07/15/17 1456    Aviva Kluver B, PA-C 07/23/17 1155    Loren Racer, MD 07/24/17 1718

## 2017-07-11 ENCOUNTER — Other Ambulatory Visit: Payer: Self-pay

## 2017-07-11 ENCOUNTER — Inpatient Hospital Stay (HOSPITAL_COMMUNITY): Payer: BC Managed Care – PPO

## 2017-07-11 ENCOUNTER — Encounter (HOSPITAL_COMMUNITY): Payer: Self-pay | Admitting: Student

## 2017-07-11 DIAGNOSIS — F1010 Alcohol abuse, uncomplicated: Secondary | ICD-10-CM

## 2017-07-11 DIAGNOSIS — J9601 Acute respiratory failure with hypoxia: Secondary | ICD-10-CM

## 2017-07-11 DIAGNOSIS — J869 Pyothorax without fistula: Secondary | ICD-10-CM

## 2017-07-11 DIAGNOSIS — Z9889 Other specified postprocedural states: Secondary | ICD-10-CM

## 2017-07-11 DIAGNOSIS — I700 Atherosclerosis of aorta: Secondary | ICD-10-CM

## 2017-07-11 DIAGNOSIS — J690 Pneumonitis due to inhalation of food and vomit: Secondary | ICD-10-CM

## 2017-07-11 HISTORY — PX: IR THORACENTESIS ASP PLEURAL SPACE W/IMG GUIDE: IMG5380

## 2017-07-11 LAB — LIPID PANEL
CHOL/HDL RATIO: 2.1 ratio
Cholesterol: 88 mg/dL (ref 0–200)
HDL: 41 mg/dL (ref >40)
LDL CALC: 38 mg/dL (ref 0–99)
TRIGLYCERIDES: 47 mg/dL (ref <150)
VLDL: 9 mg/dL (ref 0–40)

## 2017-07-11 LAB — BODY FLUID CELL COUNT WITH DIFFERENTIAL
Eos, Fluid: 0 %
Lymphs, Fluid: 0 %
Monocyte-Macrophage-Serous Fluid: 0 % — ABNORMAL LOW (ref 50–90)
Neutrophil Count, Fluid: 100 % — ABNORMAL HIGH (ref 0–25)
Total Nucleated Cell Count, Fluid: 10000 cu mm — ABNORMAL HIGH (ref 0–1000)

## 2017-07-11 LAB — URINALYSIS, ROUTINE W REFLEX MICROSCOPIC
BILIRUBIN URINE: NEGATIVE
GLUCOSE, UA: NEGATIVE mg/dL
Ketones, ur: 5 mg/dL — AB
NITRITE: NEGATIVE
Protein, ur: NEGATIVE mg/dL
SPECIFIC GRAVITY, URINE: 1.006 (ref 1.005–1.030)
pH: 6 (ref 5.0–8.0)

## 2017-07-11 LAB — TYPE AND SCREEN
ABO/RH(D): O POS
Antibody Screen: NEGATIVE

## 2017-07-11 LAB — LACTATE DEHYDROGENASE, PLEURAL OR PERITONEAL FLUID: LD, Fluid: 1193 U/L — ABNORMAL HIGH (ref 3–23)

## 2017-07-11 LAB — COMPREHENSIVE METABOLIC PANEL
ALBUMIN: 2.5 g/dL — AB (ref 3.5–5.0)
ALK PHOS: 116 U/L (ref 38–126)
ALT: 11 U/L — AB (ref 14–54)
AST: 19 U/L (ref 15–41)
Anion gap: 11 (ref 5–15)
BUN: 7 mg/dL (ref 6–20)
CALCIUM: 8 mg/dL — AB (ref 8.9–10.3)
CHLORIDE: 95 mmol/L — AB (ref 101–111)
CO2: 26 mmol/L (ref 22–32)
CREATININE: 0.67 mg/dL (ref 0.44–1.00)
GFR calc Af Amer: >60 mL/min (ref >60)
GFR calc non Af Amer: >60 mL/min (ref >60)
GLUCOSE: 118 mg/dL — AB (ref 65–99)
Potassium: 3.6 mmol/L (ref 3.5–5.1)
SODIUM: 132 mmol/L — AB (ref 135–145)
Total Bilirubin: 0.7 mg/dL (ref 0.3–1.2)
Total Protein: 5.4 g/dL — ABNORMAL LOW (ref 6.5–8.1)

## 2017-07-11 LAB — GLUCOSE, PLEURAL OR PERITONEAL FLUID: Glucose, Fluid: <20 mg/dL

## 2017-07-11 LAB — HIV ANTIBODY (ROUTINE TESTING W REFLEX): HIV Screen 4th Generation wRfx: NONREACTIVE

## 2017-07-11 LAB — RAPID URINE DRUG SCREEN, HOSP PERFORMED
Amphetamines: NOT DETECTED
BARBITURATES: NOT DETECTED
BENZODIAZEPINES: NOT DETECTED
COCAINE: NOT DETECTED
Opiates: POSITIVE — AB
TETRAHYDROCANNABINOL: NOT DETECTED

## 2017-07-11 LAB — PROTIME-INR
INR: 1.13
PROTHROMBIN TIME: 14.4 s (ref 11.4–15.2)

## 2017-07-11 LAB — ABO/RH: ABO/RH(D): O POS

## 2017-07-11 LAB — APTT: APTT: 33 s (ref 24–36)

## 2017-07-11 LAB — GRAM STAIN

## 2017-07-11 LAB — BLOOD GAS, ARTERIAL
Acid-Base Excess: 3.6 mmol/L — ABNORMAL HIGH (ref 0.0–2.0)
BICARBONATE: 28.1 mmol/L — AB (ref 20.0–28.0)
Drawn by: 52076
O2 Content: 4 L/min
O2 Saturation: 92.2 %
PATIENT TEMPERATURE: 98.6
PH ART: 7.402 (ref 7.350–7.450)
PO2 ART: 64.1 mmHg — AB (ref 83.0–108.0)
pCO2 arterial: 46.1 mmHg (ref 32.0–48.0)

## 2017-07-11 LAB — CBC
HCT: 42 % (ref 36.0–46.0)
HEMOGLOBIN: 14.1 g/dL (ref 12.0–15.0)
MCH: 35.1 pg — ABNORMAL HIGH (ref 26.0–34.0)
MCHC: 33.6 g/dL (ref 30.0–36.0)
MCV: 104.5 fL — ABNORMAL HIGH (ref 78.0–100.0)
PLATELETS: 344 10*3/uL (ref 150–400)
RBC: 4.02 MIL/uL (ref 3.87–5.11)
RDW: 13 % (ref 11.5–15.5)
WBC: 13 10*3/uL — AB (ref 4.0–10.5)

## 2017-07-11 LAB — TSH: TSH: 2.922 u[IU]/mL (ref 0.350–4.500)

## 2017-07-11 LAB — EXPECTORATED SPUTUM ASSESSMENT W GRAM STAIN, RFLX TO RESP C

## 2017-07-11 LAB — LACTIC ACID, PLASMA
Lactic Acid, Venous: 1.3 mmol/L (ref 0.5–1.9)
Lactic Acid, Venous: 0.9 mmol/L (ref 0.5–1.9)

## 2017-07-11 LAB — TROPONIN I: Troponin I: 0.12 ng/mL (ref <0.03)

## 2017-07-11 LAB — ALBUMIN, PLEURAL OR PERITONEAL FLUID: Albumin, Fluid: 2.1 g/dL

## 2017-07-11 LAB — HEMOGLOBIN A1C
Hgb A1c MFr Bld: 5 % (ref 4.8–5.6)
Mean Plasma Glucose: 96.8 mg/dL

## 2017-07-11 LAB — STREP PNEUMONIAE URINARY ANTIGEN: Strep Pneumo Urinary Antigen: NEGATIVE

## 2017-07-11 LAB — PROTEIN, PLEURAL OR PERITONEAL FLUID: TOTAL PROTEIN, FLUID: 3.9 g/dL

## 2017-07-11 MED ORDER — LIDOCAINE HCL (PF) 1 % IJ SOLN
INTRAMUSCULAR | Status: DC | PRN
  Administered 2017-07-11: 10 mL

## 2017-07-11 MED ORDER — ASPIRIN 325 MG PO TABS
325.0000 mg | ORAL_TABLET | Freq: Every day | ORAL | Status: DC
  Administered 2017-07-11 – 2017-07-18 (×7): 325 mg via ORAL
  Filled 2017-07-11 (×7): qty 1

## 2017-07-11 MED ORDER — PIPERACILLIN-TAZOBACTAM 3.375 G IVPB
3.3750 g | Freq: Three times a day (TID) | INTRAVENOUS | Status: DC
  Administered 2017-07-11 – 2017-07-15 (×12): 3.375 g via INTRAVENOUS
  Filled 2017-07-11 (×14): qty 50

## 2017-07-11 MED ORDER — LIDOCAINE HCL (PF) 1 % IJ SOLN
INTRAMUSCULAR | Status: AC
  Filled 2017-07-11: qty 30

## 2017-07-11 MED ORDER — ORAL CARE MOUTH RINSE
15.0000 mL | Freq: Two times a day (BID) | OROMUCOSAL | Status: DC
  Administered 2017-07-11 – 2017-07-17 (×6): 15 mL via OROMUCOSAL

## 2017-07-11 MED ORDER — VANCOMYCIN HCL IN DEXTROSE 1-5 GM/200ML-% IV SOLN
1000.0000 mg | INTRAVENOUS | Status: AC
  Administered 2017-07-12: 1000 mg via INTRAVENOUS
  Filled 2017-07-11: qty 200

## 2017-07-11 NOTE — Progress Notes (Addendum)
PROGRESS NOTE    [name]  NWG:[id number] DOB: October 17, 1950 DOA: 07/10/2017 PCP: Patient, No Pcp Per   Brief Narrative:  HPI On 07/10/2017 by Dr. Lorretta Harp Erica Ball is a 67 y.o. female with medical history significant of tobacco and alcohol abuse, hypertension, who presents with shortness breath, cough, chest pain and generalized weakness.  Patient states that she has been having dry cough and SOB in the past 4 days, which has been progressively getting worse. She has right sided chest pain, which is constant, sharp, 10 out of 10 in severity, radiating to the right flank area . It is pleuritic, aggravated by deep breath. No hemoptysis. No tenderness in calf areas. Patient does not have fever or chills. Patient denies nausea, vomiting, diarrhea, abdominal pain, symptoms of UTI or unilateral weakness. Pt was seen by PCP today. This is her first visit with this provider. CXR was taken and she was called back by PCP, instructed to come to ED due to "fluid in lung". Patient reports smoking a pack a day for 48years and drinks vodka every daily. Pt can barely speak in full sentence.   Assessment & Plan   Acute respiratory failure with hypoxia/right pleural effusion/possible obstruction -CTA was negative for pulmonary embolism however showed large right-sided pleural effusion, right lower lobe bronchus obstruction and possible lobar pneumonia -Continue supplemental oxygen to maintain saturations above 92% -Influenza PCR negative -Interventional radiology consulted and appreciated for ultrasound-guided thoracentesis-which yielded 320 mL of yellow, cloudy fluid -Pulmonology consulted for possible bronchoscopy given loculations noted on CT scan as well as possible bronchus obstruction -Continue azithromycin, ceftriaxone for treatment of possible pneumonia -Urine strep pneumonia antigen negative -Continue nebulizer treatments and guaifenesin  Sepsis secondary to UTI/pneumonia -Patient  presented with leukocytosis, tachycardia and tachypnea -UA: Many bacteria, TNTC WBC, moderate leukocytes -Possible pneumonia noted on CTA -Continue azithromycin and ceftriaxone -Blood cultures no growth to date -Urine culture pending  Acute kidney injury -Suspect secondary to dehydration and sepsis -Creatinine 1.09, baseline truly unknown, as last creatinine was 0.5 in 2013 -Continue to monitor BMP  Elevated troponin -Patient presented with pleuritic type chest pain, however does have pleural effusion and likely pneumonia -Suspect troponin due to demand ischemia and sepsis -Cardiology was contacted by the ED and spoke with Dr. Harlow Mares.  Recommended to continue cycling troponin, did not feel this is ACS related. -Plan currently cycle, improving down to 0.12 from 0.55 -Echocardiogram ordered -Continue statin, aspirin added -LDL 38  Essential hypertension -Given sepsis, will hold amlodipine -Continue to monitor closely, hydralazine as needed  Tobacco/alcohol abuse -Discussed cessation -Continue CIWA protocol and nicotine patch  DVT Prophylaxis  SCDs  Code Status: Full  Family Communication: None at bedside  Disposition Plan: Admitted. Pending improvement in respiratory status.  Consultants Interventional radiology PCCM  Procedures  Right US guided thoracentesis   Antibiotics   Anti-infectives (From admission, onward)   Start     Dose/Rate Route Frequency Ordered Stop   07/11/17 2000  azithromycin (ZITHROMAX) 500 mg in sodium chloride 0.9 % 250 mL IVPB     500 mg 250 mL/hr over 60 Minutes Intravenous Every 24 hours 07/10/17 2054 07/18/17 1959   07/11/17 1800  cefTRIAXone (ROCEPHIN) 1 g in sodium chloride 0.9 % 100 mL IVPB     1 g 200 mL/hr over 30 Minutes Intravenous Every 24 hours 07/10/17 2054 07/18/17 1759   07/10/17 1815  cefTRIAXone (ROCEPHIN) 1 g in sodium chloride 0.9 % 100 mL IVPB     1  g 200 mL/hr over 30 Minutes Intravenous  Once 07/10/17 1812 07/10/17 1912    07/10/17 1815  azithromycin (ZITHROMAX) 500 mg in sodium chloride 0.9 % 250 mL IVPB     500 mg 250 mL/hr over 60 Minutes Intravenous  Once 07/10/17 1812 07/10/17 2209      Subjective:   [name] seen and examined today.  Continues to complain of right-sided pain and difficulty breathing.  Feels it is very difficult to take in a deep breath.  Denies current chest pain, abdominal pain, nausea or vomiting, diarrhea or constipation.  States she wants to cough up phlegm however feels it is stuck.  Objective:   Vitals:   07/11/17 0600 07/11/17 0827 07/11/17 1000 07/11/17 1417  BP: 131/67  131/73   Pulse: 100  100   Resp: 16     Temp: 99 F (37.2 C)     TempSrc: Oral     SpO2: 91% 94% 93% 92%  Weight: 70.5 kg (155 lb 8 oz)     Height:        Intake/Output Summary (Last 24 hours) at 07/11/2017 1425 Last data filed at 07/11/2017 1021 Gross per 24 hour  Intake 3903.34 ml  Output 1100 ml  Net 2803.34 ml   Filed Weights   07/10/17 1618 07/10/17 2345 07/11/17 0600  Weight: 68 kg (150 lb) 65.9 kg (145 lb 4.8 oz) 70.5 kg (155 lb 8 oz)    Exam  General: Well developed, well nourished, NAD, appears stated age  HEENT: NCAT, mucous membranes moist.   Neck: Supple  Cardiovascular: S1 S2 auscultated, RRR, no murmur  Respiratory: Diminished breath sounds on the right  Abdomen: Soft, nontender, nondistended, + bowel sounds  Extremities: warm dry without cyanosis clubbing or edema  Neuro: AAOx3, nonfocal  Psych: anxious, however appropriate   Data Reviewed: I have personally reviewed following labs and imaging studies  CBC: Recent Labs  Lab 07/10/17 1625  WBC 21.3*  NEUTROABS 19.4*  HGB 15.8*  HCT 45.3  MCV 103.4*  PLT 314   Basic Metabolic Panel: Recent Labs  Lab 07/10/17 1625  NA 130*  K 3.8  CL 90*  CO2 26  GLUCOSE 122*  BUN 19  CREATININE 1.09*  CALCIUM 9.0   GFR: Estimated Creatinine Clearance: 46.2 mL/min (A) (by C-G formula based on SCr of  1.09 mg/dL (H)). Liver Function Tests: Recent Labs  Lab 07/10/17 1625  AST 12*  ALT 7*  ALKPHOS 78  BILITOT 0.7  PROT 6.7  ALBUMIN 3.0*   No results for input(s): LIPASE, AMYLASE in the last 168 hours. No results for input(s): AMMONIA in the last 168 hours. Coagulation Profile: Recent Labs  Lab 07/10/17 2110  INR 1.07   Cardiac Enzymes: Recent Labs  Lab 07/10/17 1736 07/10/17 2005 07/11/17 0704  TROPONINI 0.49* 0.55* 0.12*   BNP (last 3 results) No results for input(s): PROBNP in the last 8760 hours. HbA1C: Recent Labs    07/11/17 0304  HGBA1C 5.0   CBG: No results for input(s): GLUCAP in the last 168 hours. Lipid Profile: Recent Labs    07/11/17 0304  CHOL 88  HDL 41  LDLCALC 38  TRIG 47  CHOLHDL 2.1   Thyroid Function Tests: Recent Labs    07/11/17 0304  TSH 2.922   Anemia Panel: No results for input(s): VITAMINB12, FOLATE, FERRITIN, TIBC, IRON, RETICCTPCT in the last 72 hours. Urine analysis:    Component Value Date/Time   COLORURINE AMBER (A) 07/10/2017 1839  APPEARANCEUR CLOUDY (A) 07/10/2017 1839   LABSPEC 1.020 07/10/2017 1839   PHURINE 5.0 07/10/2017 1839   GLUCOSEU NEGATIVE 07/10/2017 1839   HGBUR SMALL (A) 07/10/2017 1839   BILIRUBINUR NEGATIVE 07/10/2017 1839   BILIRUBINUR neg 10/11/2011 1425   KETONESUR NEGATIVE 07/10/2017 1839   PROTEINUR 30 (A) 07/10/2017 1839   UROBILINOGEN 0.2 10/11/2011 1425   UROBILINOGEN 0.2 01/24/2008 2217   NITRITE NEGATIVE 07/10/2017 1839   LEUKOCYTESUR MODERATE (A) 07/10/2017 1839   Sepsis Labs: @LABRCNTIP (procalcitonin:4,lacticidven:4)  ) Recent Results (from the past 240 hour(s))  Blood Culture (routine x 2)     Status: None (Preliminary result)   Collection Time: 07/10/17  4:34 PM  Result Value Ref Range Status   Specimen Description BLOOD RIGHT ANTECUBITAL  Final   Special Requests   Final    BOTTLES DRAWN AEROBIC AND ANAEROBIC Blood Culture adequate volume   Culture   Final    NO GROWTH  < 24 HOURS Performed at [facility], [address], [place], [place] [address]    Report Status PENDING  Incomplete  Blood Culture (routine x 2)     Status: None (Preliminary result)   Collection Time: 07/10/17  4:34 PM  Result Value Ref Range Status   Specimen Description BLOOD RIGHT ANTECUBITAL  Final   Special Requests IN PEDIATRIC BOTTLE Blood Culture adequate volume  Final   Culture   Final    NO GROWTH < 24 HOURS Performed at [facility], [address], [place], [place] [address]    Report Status PENDING  Incomplete  Culture, sputum-assessment     Status: None   Collection Time: 07/11/17 11:54 AM  Result Value Ref Range Status   Specimen Description EXPECTORATED SPUTUM  Final   Special Requests NONE  Final   Sputum evaluation   Final    Sputum specimen not acceptable for testing.  Please recollect.   Gram Stain Report Called to,Read Back By and Verified With: RN Gildardo Griffes [phone] MLM Performed at [facility], [address] [place], [place] [address]    Report Status 07/11/2017 FINAL  Final      Radiology Studies: Dg Chest 1 View  Result Date: 07/11/2017 CLINICAL DATA:  Patient status post right thoracentesis. EXAM: CHEST 1 VIEW COMPARISON:  Chest radiograph 07/10/2017 FINDINGS: Monitoring leads overlie the patient. Stable cardiac and mediastinal contours. Persistent moderate right pleural effusion. Underlying consolidation right lower lung. Left basilar atelectasis. No pneumothorax. IMPRESSION: Redemonstrated moderate right pleural effusion with underlying consolidation. Electronically Signed   By: Annia Belt M.D.   On: 07/11/2017 13:39   Ct Angio Chest Pe W/cm &/or Wo Cm  Result Date: 07/10/2017 CLINICAL DATA:  67 year old female with right-sided chest pain, suspect pulmonary embolus EXAM: CT ANGIOGRAPHY CHEST WITH CONTRAST TECHNIQUE: Multidetector CT imaging of the chest was performed using the standard protocol during bolus administration of  intravenous contrast. Multiplanar CT image reconstructions and MIPs were obtained to evaluate the vascular anatomy. CONTRAST:  100 mL Isovue 370 COMPARISON:  Chest x-ray obtained earlier today; prior CT scan of the chest 10/01/2004 FINDINGS: Cardiovascular: Satisfactory opacification of the pulmonary arteries to the segmental level. No evidence of pulmonary embolism. Normal heart size. No pericardial effusion. The main pulmonary artery is enlarged at 3.3 cm. Trace aortic atherosclerotic calcifications. Calcifications are present throughout the coronary arteries. The heart is normal in size. No pericardial effusion. Mediastinum/Nodes: Unremarkable CT appearance of the thyroid gland. No suspicious mediastinal or hilar adenopathy. No  soft tissue mediastinal mass. The thoracic esophagus is unremarkable. Lungs/Pleura: Large right-sided pleural effusion with associated atelectasis of the entire right lower lobe and a portion of the inferior right middle lobe. There is a smaller loculated pleural effusion at the right lung apex. Complete occlusion of the right lower lobe bronchus with what appears to be secretions. No definite enhancing mass. Mild elevation of the left hemidiaphragm. Subsegmental left lower lobe atelectasis. Upper Abdomen: No acute abnormality. Musculoskeletal: No chest wall abnormality. No acute or significant osseous findings. Review of the MIP images confirms the above findings. IMPRESSION: 1. Large right-sided pleural effusion with complete atelectasis of the right lower lobe and partial atelectasis of the inferior right middle lobe. Of note, the right lower lobe bronchus is completely obstructed by what appears to be secretions/mucus plugging. An endobronchial lesion is considered less likely but not excluded. The pleural effusion may be secondary to chronic atelectasis from this obstructed bronchus rather than the primary pathology. Recommend referral to pulmonology for bronchoscopy. 2. Smaller  loculated pleural effusion at the posterior aspect of the right apex. 3. Enlarged main pulmonary artery as can be seen in the setting of pulmonary arterial hypertension. 4.  Aortic Atherosclerosis (ICD10-170.0). 5. Coronary artery calcifications. Electronically Signed   By: Malachy Moan M.D.   On: 07/10/2017 19:41   Dg Chest Port 1 View  Result Date: 07/10/2017 CLINICAL DATA:  67 year old female with right chest and back pain EXAM: PORTABLE CHEST 1 VIEW COMPARISON:  Prior chest x-ray 10/30/2011 FINDINGS: Dense right basilar opacity with complete obscure a shin of the right hemidiaphragm. Veil like opacity in the right mid lung. The left lung appears clear. The cardiac mediastinal contours are grossly within normal limits. Atherosclerotic calcification is present in the transverse aorta. No pneumothorax. IMPRESSION: Dense right basilar opacity likely represents a combination of pleural fluid with either atelectasis or infiltrate/pneumonia. An underlying pulmonary nodule or mass is difficult to exclude radiographically. Aortic Atherosclerosis (ICD10-170.0) Electronically Signed   By: Malachy Moan M.D.   On: 07/10/2017 18:07     Scheduled Meds: . aspirin  325 mg Oral Daily  . atorvastatin  40 mg Oral q1800  . folic acid  1 mg Oral Daily  . levalbuterol  1.25 mg Nebulization TID  . lidocaine (PF)      . LORazepam  0-4 mg Intravenous Q6H   Followed by  . [START ON 07/12/2017] LORazepam  0-4 mg Intravenous Q12H  . mouth rinse  15 mL Mouth Rinse BID  . multivitamin with minerals  1 tablet Oral Daily  . nicotine  21 mg Transdermal Daily  . thiamine  100 mg Oral Daily   Or  . thiamine  100 mg Intravenous Daily   Continuous Infusions: . sodium chloride 1,000 mL (07/11/17 1051)  . azithromycin    . cefTRIAXone (ROCEPHIN)  IV       LOS: 1 day   Time Spent in minutes   45 minutes  Siedah Sedor D.O. on 07/11/2017 at 2:25 PM  Between 7am to 7pm - Pager - [phone]  After 7pm go to  [email or web address] - password [email or web address]  And look for the night coverage person covering for me after hours  [facility] Office  [phone]

## 2017-07-11 NOTE — Progress Notes (Signed)
Pharmacy Antibiotic Note  Erica Ball is a 67 y.o. female started on Ceftriaxone and Azithromycin last night for CAP coverage. Now s/p thoracentesis and broadening Ceftriaxone to Zosyn for aspiration pneumonia coverage. Pleural fluid consitent with empyema per Pulmonary.  Plan:  Zosyn 3.375 gm IV q8hrs (each over 4 hours)  Do not expect any need to adjust regimen.  To continue Azithromycin 500 mg IV q24hrs.  7-day stop date in place.  Follow renal function, culture data, clinical progress and antibiotic plans.    Temp (24hrs), Avg:99 F (37.2 C), Min:99 F (37.2 C), Max:99 F (37.2 C)  Recent Labs  Lab 07/10/17 1625 07/10/17 1651 07/10/17 1855 07/11/17 0005 07/11/17 0304  WBC 21.3*  --   --   --   --   CREATININE 1.09*  --   --   --   --   LATICACIDVEN  --  2.42* 2.51* 1.3 0.9    Estimated Creatinine Clearance: 46.2 mL/min (A) (by C-G formula based on SCr of 1.09 mg/dL (H)).    Allergies  Allergen Reactions  . Dextromethorphan Other (See Comments)    Makes her very angry and she wants to hurt people    Antimicrobials this admission:  CTX 3/6>>3/7 (got 1 dose)  Zosyn 3/7>>  Azithromycin 3/6>> (3/14)  Dose adjustments this admission:  n/a  Microbiology results:  3/6 Influenza panel - negative  3/6 Strep pneumo Ag - negative  3/6 urine - in process  3/6 blood x 2 - no growth < 24 hrs to date  3/6 HIV - non-reactive  3/6 Legionella Ag - in process  3/7 sputum - unacceptable specimen  3/7 pleural fluid -  3/7 fungal culture -   Thank you for allowing pharmacy to be a part of this patient's care.  Erica Ball, Erica Ball, Erica Ball Pager: [phone] 07/11/2017 3:51 PM

## 2017-07-11 NOTE — Procedures (Signed)
PROCEDURE SUMMARY:  Successful US guided right-sided diagnostic and therapeutic thoracentesis. Yielded 320 mL of yellow, cloudy fluid. Pt tolerated procedure well. No immediate complications.  Specimen was sent for labs. CXR ordered.  Hoyt KochKacie Sue-Ellen Kahle Mcqueen PA-C 07/11/2017 1:23 PM

## 2017-07-11 NOTE — H&P (View-Only) (Signed)
Reason for Consult:Right empyema Referring Physician: Dr. Kieth Brightly ARCENIA SCARBRO is an 67 y.o. female.  HPI: 67 yo woman presented with a cc/o CP and SOB  Mrs. Folger is a 67 yo woman with a past history of tobacco abuse, hypertension, ethanol abuse and allergies. She developed a cough a couple of weeks ago. Sunday night she had sudden onset of right sided chest pain and shortness of breath. Self medicated and went to work on Monday but had to leave early. She finally went to Urgent Care on Wednesday morning. A CXR showed a right effusion. She was sent to the ED and a CT was done. There was no PE but she had a large right effusion. There was no evidence of PE. Thoracentesis drained 320 ml of fluid. LDH was 1100, glucose < 20 and WBC was 10K, all neutrophils.  She had some symptomatic relief with thoracentesis but continues to have Right sided CP. She was started on Zithromax and ceftriaxone initially. Now on Zithromax and Zosyn for presumed aspiration.  Zubrod Score: At the time of surgery this patient's most appropriate activity status/level should be described as: []     0    Normal activity, no symptoms [x]     1    Restricted in physical strenuous activity but ambulatory, able to do out light work []     2    Ambulatory and capable of self care, unable to do work activities, up and about >50 % of waking hours                              []     3    Only limited self care, in bed greater than 50% of waking hours []     4    Completely disabled, no self care, confined to bed or chair []     5    Moribund   Past Medical History:  Diagnosis Date  . Alcohol abuse   . Allergy   . Cataract   . Hypertension   . Tobacco abuse     Past Surgical History:  Procedure Laterality Date  . ABDOMINAL HYSTERECTOMY    . CATARACT EXTRACTION    . FRACTURE SURGERY    . IR THORACENTESIS ASP PLEURAL SPACE W/IMG GUIDE  07/11/2017    Family History  Problem Relation Age of Onset  . Hypertension Mother   .  Heart disease Mother   . Hypertension Father   . Heart disease Father   . Stroke Brother   . Hypertension Brother   . Heart disease Brother     Social History:  reports that she has been smoking cigarettes.  She has been smoking about 1.50 packs per day. she has never used smokeless tobacco. She reports that she drinks about 12.6 oz of alcohol per week. She reports that she does not use drugs.  Allergies:  Allergies  Allergen Reactions  . Dextromethorphan Other (See Comments)    Makes her very angry and she wants to hurt people    Medications:  Prior to Admission:  Medications Prior to Admission  Medication Sig Dispense Refill Last Dose  . albuterol (PROVENTIL HFA;VENTOLIN HFA) 108 (90 Base) MCG/ACT inhaler Inhale 1-2 puffs into the lungs every 4 (four) hours as needed for shortness of breath.   07/10/2017 at Unknown time  . amLODipine (NORVASC) 10 MG tablet Take 1 tablet (10 mg total) by mouth daily. 90 tablet 3 07/10/2017 at  Unknown time  . Aspirin-Salicylamide-Caffeine (BC HEADACHE PO) Take 1 Package by mouth every morning.   Past Week at Unknown time  . azithromycin (ZITHROMAX) 250 MG tablet Take 250-500 mg by mouth See admin instructions. Take 571m on the first day, then take 2581mdaily until gone   07/10/2017 at Unknown time  . Chlorphen-Phenyleph-ASA (ALKA-SELTZER PLUS COLD PO) Take 1-2 tablets by mouth daily as needed (cold sx).   Past Week at Unknown time  . guaiFENesin-codeine 100-10 MG/5ML syrup    07/10/2017 at Unknown time    Results for orders placed or performed during the hospital encounter of 07/10/17 (from the past 48 hour(s))  Comprehensive metabolic panel     Status: Abnormal   Collection Time: 07/10/17  4:25 PM  Result Value Ref Range   Sodium 130 (L) 135 - 145 mmol/L   Potassium 3.8 3.5 - 5.1 mmol/L   Chloride 90 (L) 101 - 111 mmol/L   CO2 26 22 - 32 mmol/L   Glucose, Bld 122 (H) 65 - 99 mg/dL   BUN 19 6 - 20 mg/dL   Creatinine, Ser 1.09 (H) 0.44 - 1.00 mg/dL    Calcium 9.0 8.9 - 10.3 mg/dL   Total Protein 6.7 6.5 - 8.1 g/dL   Albumin 3.0 (L) 3.5 - 5.0 g/dL   AST 12 (L) 15 - 41 U/L   ALT 7 (L) 14 - 54 U/L   Alkaline Phosphatase 78 38 - 126 U/L   Total Bilirubin 0.7 0.3 - 1.2 mg/dL   GFR calc non Af Amer 51 (L) >60 mL/min   GFR calc Af Amer 59 (L) >60 mL/min    Comment: (NOTE) The eGFR has been calculated using the CKD EPI equation. This calculation has not been validated in all clinical situations. eGFR's persistently <60 mL/min signify possible Chronic Kidney Disease.    Anion gap 14 5 - 15    Comment: Performed at [facility] [place] [id number] with Differential     Status: Abnormal   Collection Time: 07/10/17  4:25 PM  Result Value Ref Range   WBC 21.3 (H) 4.0 - 10.5 K/uL   RBC 4.38 3.87 - 5.11 MIL/uL   Hemoglobin 15.8 (H) 12.0 - 15.0 g/dL   HCT 45.3 36.0 - 46.0 %   MCV 103.4 (H) 78.0 - 100.0 fL   MCH 36.1 (H) 26.0 - 34.0 pg   MCHC 34.9 30.0 - 36.0 g/dL   RDW 12.5 11.5 - 15.5 %   Platelets 314 150 - 400 K/uL   Neutrophils Relative % 91 %   Neutro Abs 19.4 (H) 1.7 - 7.7 K/uL   Lymphocytes Relative 5 %   Lymphs Abs 1.0 0.7 - 4.0 K/uL   Monocytes Relative 4 %   Monocytes Absolute 0.9 0.1 - 1.0 K/uL   Eosinophils Relative 0 %   Eosinophils Absolute 0.0 0.0 - 0.7 K/uL   Basophils Relative 0 %   Basophils Absolute 0.0 0.0 - 0.1 K/uL    Comment: Performed at [facility] [place] [id number] natriuretic peptide     Status: Abnormal   Collection Time: 07/10/17  4:25 PM  Result Value Ref Range   B Natriuretic Peptide 176.3 (H) 0.0 - 100.0 pg/mL    Comment: Performed at [facility] [place] [id number] Culture (routine x 2)     Status: None (Preliminary result)  Collection Time: 07/10/17  4:34 PM  Result Value Ref Range   Specimen Description BLOOD RIGHT ANTECUBITAL    Special Requests      BOTTLES DRAWN AEROBIC AND  ANAEROBIC Blood Culture adequate volume   Culture      NO GROWTH < 24 HOURS Performed at [facility] [address] [place], [place] [address]    Report Status PENDING   Blood Culture (routine x 2)     Status: None (Preliminary result)   Collection Time: 07/10/17  4:34 PM  Result Value Ref Range   Specimen Description BLOOD RIGHT ANTECUBITAL    Special Requests IN PEDIATRIC BOTTLE Blood Culture adequate volume    Culture      NO GROWTH < 24 HOURS Performed at [facility], [place] [address] [place], [place] [address]    Report Status PENDING   I-Stat CG4 Lactic Acid, ED     Status: Abnormal   Collection Time: 07/10/17  4:51 PM  Result Value Ref Range   Lactic Acid, Venous 2.42 (HH) 0.5 - 1.9 mmol/L   Comment NOTIFIED PHYSICIAN   Troponin I     Status: Abnormal   Collection Time: 07/10/17  5:36 PM  Result Value Ref Range   Troponin I 0.49 (HH) <0.03 ng/mL    Comment: CRITICAL RESULT CALLED TO, READ BACK BY AND VERIFIED WITH: A.GORTON,RN 07/10/17 1854 DAVISB Performed at [facility], [place] [address], [place], [place] [address]   Urinalysis, Routine w reflex microscopic     Status: Abnormal   Collection Time: 07/10/17  6:39 PM  Result Value Ref Range   Color, Urine AMBER (A) YELLOW    Comment: BIOCHEMICALS MAY BE AFFECTED BY COLOR   APPearance CLOUDY (A) CLEAR   Specific Gravity, Urine 1.020 1.005 - 1.030   pH 5.0 5.0 - 8.0   Glucose, UA NEGATIVE NEGATIVE mg/dL   Hgb urine dipstick SMALL (A) NEGATIVE   Bilirubin Urine NEGATIVE NEGATIVE   Ketones, ur NEGATIVE NEGATIVE mg/dL   Protein, ur 30 (A) NEGATIVE mg/dL   Nitrite NEGATIVE NEGATIVE   Leukocytes, UA MODERATE (A) NEGATIVE   RBC / HPF 6-30 0 - 5 RBC/hpf   WBC, UA TOO NUMEROUS TO COUNT 0 - 5 WBC/hpf   Bacteria, UA MANY (A) NONE SEEN   Squamous Epithelial / LPF TOO NUMEROUS TO COUNT (A) NONE SEEN   Mucus PRESENT    Hyaline Casts, UA PRESENT     Comment: Performed at [facility], [place] [address] [place], [place] [address]  Strep pneumoniae urinary antigen     Status: None   Collection Time: 07/10/17  6:39 PM  Result Value Ref Range   Strep Pneumo Urinary Antigen NEGATIVE NEGATIVE    Comment:        Infection due to S. pneumoniae cannot be absolutely ruled out since the antigen present may be below the detection limit of the test. Performed at [facility], [place] [address], [place], [place] [address]   I-Stat CG4 Lactic Acid, ED     Status: Abnormal   Collection Time: 07/10/17  6:55 PM  Result Value Ref Range   Lactic Acid, Venous 2.51 (HH) 0.5 - 1.9 mmol/L   Comment NOTIFIED PHYSICIAN   Troponin I     Status: Abnormal   Collection Time: 07/10/17  8:05 PM  Result Value Ref Range   Troponin I 0.55 (HH) <0.03 ng/mL    Comment: CRITICAL VALUE NOTED.  VALUE IS CONSISTENT WITH PREVIOUSLY REPORTED AND CALLED VALUE. Performed at [facility], [place] [address] [place], [place] [address]   Influenza panel by PCR (type A & B)     Status: None   Collection Time: 07/10/17  8:30 PM  Result Value Ref Range   Influenza A By PCR NEGATIVE NEGATIVE   Influenza B By PCR NEGATIVE NEGATIVE    Comment: (NOTE) The Xpert Xpress Flu assay is intended as an aid in the diagnosis of  influenza and should not be used as a sole basis for treatment.  This  assay is FDA approved for nasopharyngeal swab specimens only. Nasal  washings and aspirates are unacceptable for Xpert Xpress Flu testing. Performed at [facility], [place] [address] [place],  [address]   Procalcitonin     Status: None   Collection Time: 07/10/17  9:10 PM  Result Value Ref Range   Procalcitonin 1.21 ng/mL    Comment:        Interpretation: PCT > 0.5 ng/mL and <= 2 ng/mL: Systemic infection (sepsis) is possible, but other conditions are known to elevate PCT as well. (NOTE)       Sepsis PCT Algorithm           Lower Respiratory Tract                                      Infection PCT Algorithm     ----------------------------     ----------------------------         PCT < 0.25 ng/mL                PCT < 0.10 ng/mL         Strongly encourage             Strongly discourage   discontinuation of antibiotics    initiation of antibiotics    ----------------------------     -----------------------------       PCT 0.25 - 0.50 ng/mL            PCT 0.10 - 0.25 ng/mL               OR       >80% decrease in PCT            Discourage initiation of                                            antibiotics      Encourage discontinuation           of antibiotics    ----------------------------     -----------------------------         PCT >= 0.50 ng/mL              PCT 0.26 - 0.50 ng/mL                AND       <80% decrease in PCT             Encourage initiation of                                             antibiotics  Encourage continuation           of antibiotics    ----------------------------     -----------------------------        PCT >= 0.50 ng/mL                  PCT > 0.50 ng/mL               AND         increase in PCT                  Strongly encourage                                      initiation of antibiotics    Strongly encourage escalation           of antibiotics                                     -----------------------------                                           PCT <= 0.25 ng/mL                                                 OR                                        > 80% decrease in PCT                                     Discontinue / Do not initiate                                             antibiotics Performed at [facility], [address] [place], [place] [address]   Protime-INR     Status: None   Collection Time: 07/10/17  9:10 PM  Result Value Ref Range   Prothrombin Time 13.9 11.4 - 15.2 seconds   INR 1.07     Comment: Performed at [facility] [address] [place], [place] [address]  APTT     Status: None    Collection Time: 07/10/17  9:10 PM  Result Value Ref Range   aPTT 33 24 - 36 seconds    Comment: Performed at [facility] [address], [place], [place] [address]  HIV antibody     Status: None   Collection Time: 07/10/17  9:10 PM  Result Value Ref Range   HIV Screen 4th Generation wRfx Non Reactive Non Reactive    Comment: (NOTE) Performed At: [facility] [address] [place], [place] [id number] Rush Farmer MD [id number] Performed at [facility], [place] [address], [place], [place] [address]  Lactic acid, plasma     Status: None   Collection Time: 07/11/17 12:05 AM  Result Value Ref Range   Lactic Acid, Venous 1.3 0.5 - 1.9 mmol/L    Comment: Performed at [facility] [address], [place], [place] [address]  Hemoglobin A1c     Status: None   Collection Time: 07/11/17  3:04 AM  Result Value Ref Range   Hgb A1c MFr Bld 5.0 4.8 - 5.6 %    Comment: (NOTE) Pre diabetes:          5.7%-6.4% Diabetes:              >6.4% Glycemic control for   <7.0% adults with diabetes    Mean Plasma Glucose 96.8 mg/dL    Comment: Performed at [facility] [address] [place], [place] [address]  Lipid panel     Status: None   Collection Time: 07/11/17  3:04 AM  Result Value Ref Range   Cholesterol 88 0 - 200 mg/dL   Triglycerides 47 <150 mg/dL   HDL 41 >40 mg/dL   Total CHOL/HDL Ratio 2.1 RATIO   VLDL 9 0 - 40 mg/dL   LDL Cholesterol 38 0 - 99 mg/dL    Comment:        Total Cholesterol/HDL:CHD Risk Coronary Heart Disease Risk Table                     Men   Women  1/2 Average Risk   3.4   3.3  Average Risk       5.0   4.4  2 X Average Risk   9.6   7.1  3 X Average Risk  23.4   11.0        Use the calculated Patient Ratio above and the CHD Risk Table to determine the patient's CHD Risk.        ATP III CLASSIFICATION (LDL):  <100     mg/dL   Optimal  100-129  mg/dL   Near or Above                    Optimal  130-159  mg/dL    Borderline  160-189  mg/dL   High  >190     mg/dL   Very High Performed at [facility] [address], [place], [place] [address]   Lactic acid, plasma     Status: None   Collection Time: 07/11/17  3:04 AM  Result Value Ref Range   Lactic Acid, Venous 0.9 0.5 - 1.9 mmol/L    Comment: Performed at [facility] [address], [place], [place] [address]  TSH     Status: None   Collection Time: 07/11/17  3:04 AM  Result Value Ref Range   TSH 2.922 0.350 - 4.500 uIU/mL    Comment: Performed by a 3rd Generation assay with a functional sensitivity of <=0.01 uIU/mL. Performed at [facility], [place] [address], [place], [place] [address]   Rapid urine drug screen (hospital performed)     Status: Abnormal   Collection Time: 07/11/17  3:46 AM  Result Value Ref Range   Opiates POSITIVE (A) NONE DETECTED   Cocaine NONE DETECTED NONE DETECTED   Benzodiazepines NONE DETECTED NONE DETECTED   Amphetamines NONE DETECTED NONE DETECTED   Tetrahydrocannabinol NONE DETECTED NONE DETECTED   Barbiturates NONE DETECTED NONE DETECTED    Comment: (NOTE) DRUG SCREEN FOR  MEDICAL PURPOSES ONLY.  IF CONFIRMATION IS NEEDED FOR ANY PURPOSE, NOTIFY LAB WITHIN 5 DAYS. LOWEST DETECTABLE LIMITS FOR URINE DRUG SCREEN Drug Class                     Cutoff (ng/mL) Amphetamine and metabolites    1000 Barbiturate and metabolites    200 Benzodiazepine                 725 Tricyclics and metabolites     300 Opiates and metabolites        300 Cocaine and metabolites        300 THC                            50 Performed at [facility], [place] [address], [place], [place] [address]   Troponin I     Status: Abnormal   Collection Time: 07/11/17  7:04 AM  Result Value Ref Range   Troponin I 0.12 (HH) <0.03 ng/mL    Comment: CRITICAL VALUE NOTED.  VALUE IS CONSISTENT WITH PREVIOUSLY REPORTED AND CALLED VALUE. Performed at [facility], [place] [address], [place], [place] [address]    Culture, sputum-assessment     Status: None   Collection Time: 07/11/17 11:54 AM  Result Value Ref Range   Specimen Description EXPECTORATED SPUTUM    Special Requests NONE    Sputum evaluation      Sputum specimen not acceptable for testing.  Please recollect.   Gram Stain Report Called to,Read Back By and Verified With: RN Stephens Shire [id number] Performed at [facility] [address], [place], [place] [address]    Report Status 07/11/2017 FINAL   Lactate dehydrogenase (pleural or peritoneal fluid)     Status: Abnormal   Collection Time: 07/11/17  1:28 PM  Result Value Ref Range   LD, Fluid 1,193 (H) 3 - 23 U/L    Comment: (NOTE) Results should be evaluated in conjunction with serum values    Fluid Type-FLDH Pleural R     Comment: Performed at [facility] [address] [place], [place] [address]  Body fluid cell count with differential     Status: Abnormal   Collection Time: 07/11/17  1:28 PM  Result Value Ref Range   Fluid Type-FCT Pleural R    Color, Fluid YELLOW YELLOW   Appearance, Fluid CLOUDY (A) CLEAR   WBC, Fluid 10,000 (H) 0 - 1,000 cu mm    Comment: COUNT MAY BE INACCURATE DUE TO FIBRIN CLUMPS.   Neutrophil Count, Fluid 100 (H) 0 - 25 %   Lymphs, Fluid 0 %   Monocyte-Macrophage-Serous Fluid 0 (L) 50 - 90 %   Eos, Fluid 0 %    Comment: Performed at [facility] [address], [place], [place] [address]  Albumin, pleural or peritoneal fluid     Status: None   Collection Time: 07/11/17  1:28 PM  Result Value Ref Range   Albumin, Fluid 2.1 g/dL    Comment: (NOTE) No normal range established for this test Results should be evaluated in conjunction with serum values    Fluid Type-FALB Pleural R     Comment: Performed at [facility] [address], [place], [place] [address]  Protein, pleural or peritoneal fluid     Status: None   Collection Time: 07/11/17  1:28 PM  Result Value Ref  Range   Total protein, fluid 3.9 g/dL     Comment: (NOTE) No normal range established for this test Results should be evaluated in conjunction with serum values    Fluid Type-FTP Pleural R     Comment: Performed at [facility], [place] [address] [place], [place] [address]  Glucose, pleural or peritoneal fluid     Status: None   Collection Time: 07/11/17  1:28 PM  Result Value Ref Range   Glucose, Fluid <20 mg/dL   Fluid Type-FGLU Pleural R     Comment: Performed at [facility] [address], [place], [place] [address]  Troponin I (q 6hr x 3)     Status: None   Collection Time: 07/11/17  2:06 PM  Result Value Ref Range   Troponin I <0.03 <0.03 ng/mL    Comment: Performed at [facility] [address], [place], [place] [address]    Dg Chest 1 View  Result Date: 07/11/2017 CLINICAL DATA:  Patient status post right thoracentesis. EXAM: CHEST 1 VIEW COMPARISON:  Chest radiograph 07/10/2017 FINDINGS: Monitoring leads overlie the patient. Stable cardiac and mediastinal contours. Persistent moderate right pleural effusion. Underlying consolidation right lower lung. Left basilar atelectasis. No pneumothorax. IMPRESSION: Redemonstrated moderate right pleural effusion with underlying consolidation. Electronically Signed   By: Lovey Newcomer M.D.   On: 07/11/2017 13:39   Ct Angio Chest Pe W/cm &/or Wo Cm  Result Date: 07/10/2017 CLINICAL DATA:  67 year old female with right-sided chest pain, suspect pulmonary embolus EXAM: CT ANGIOGRAPHY CHEST WITH CONTRAST TECHNIQUE: Multidetector CT imaging of the chest was performed using the standard protocol during bolus administration of intravenous contrast. Multiplanar CT image reconstructions and MIPs were obtained to evaluate the vascular anatomy. CONTRAST:  100 mL Isovue 370 COMPARISON:  Chest x-ray obtained earlier today; prior CT scan of the chest 10/01/2004 FINDINGS: Cardiovascular: Satisfactory opacification of the pulmonary arteries to the segmental level. No evidence of  pulmonary embolism. Normal heart size. No pericardial effusion. The main pulmonary artery is enlarged at 3.3 cm. Trace aortic atherosclerotic calcifications. Calcifications are present throughout the coronary arteries. The heart is normal in size. No pericardial effusion. Mediastinum/Nodes: Unremarkable CT appearance of the thyroid gland. No suspicious mediastinal or hilar adenopathy. No soft tissue mediastinal mass. The thoracic esophagus is unremarkable. Lungs/Pleura: Large right-sided pleural effusion with associated atelectasis of the entire right lower lobe and a portion of the inferior right middle lobe. There is a smaller loculated pleural effusion at the right lung apex. Complete occlusion of the right lower lobe bronchus with what appears to be secretions. No definite enhancing mass. Mild elevation of the left hemidiaphragm. Subsegmental left lower lobe atelectasis. Upper Abdomen: No acute abnormality. Musculoskeletal: No chest wall abnormality. No acute or significant osseous findings. Review of the MIP images confirms the above findings. IMPRESSION: 1. Large right-sided pleural effusion with complete atelectasis of the right lower lobe and partial atelectasis of the inferior right middle lobe. Of note, the right lower lobe bronchus is completely obstructed by what appears to be secretions/mucus plugging. An endobronchial lesion is considered less likely but not excluded. The pleural effusion may be secondary to chronic atelectasis from this obstructed bronchus rather than the primary pathology. Recommend referral to pulmonology for bronchoscopy. 2. Smaller loculated pleural effusion at the posterior aspect of the right apex. 3. Enlarged main pulmonary artery as can be seen in the setting of pulmonary arterial hypertension. 4.  Aortic Atherosclerosis (ICD10-170.0). 5. Coronary artery calcifications. Electronically  Signed   By: Jacqulynn Cadet M.D.   On: 07/10/2017 19:41   Dg Chest Port 1 View  Result  Date: 07/10/2017 CLINICAL DATA:  67 year old female with right chest and back pain EXAM: PORTABLE CHEST 1 VIEW COMPARISON:  Prior chest x-ray 10/30/2011 FINDINGS: Dense right basilar opacity with complete obscure a shin of the right hemidiaphragm. Veil like opacity in the right mid lung. The left lung appears clear. The cardiac mediastinal contours are grossly within normal limits. Atherosclerotic calcification is present in the transverse aorta. No pneumothorax. IMPRESSION: Dense right basilar opacity likely represents a combination of pleural fluid with either atelectasis or infiltrate/pneumonia. An underlying pulmonary nodule or mass is difficult to exclude radiographically. Aortic Atherosclerosis (ICD10-170.0) Electronically Signed   By: Jacqulynn Cadet M.D.   On: 07/10/2017 18:07   Ir Thoracentesis Asp Pleural Space W/img Guide  Result Date: 07/11/2017 INDICATION: Patient with right pleural effusion. Request is made for diagnostic and therapeutic thoracentesis. EXAM: ULTRASOUND GUIDED DIAGNOSTIC AND THERAPEUTIC RIGHT THORACENTESIS MEDICATIONS: 10 mL 1% lidocaine COMPLICATIONS: None immediate. PROCEDURE: An ultrasound guided thoracentesis was thoroughly discussed with the patient and questions answered. The benefits, risks, alternatives and complications were also discussed. The patient understands and wishes to proceed with the procedure. Written consent was obtained. Ultrasound was performed to localize and mark an adequate pocket of fluid in the right chest. The area was then prepped and draped in the normal sterile fashion. 1% Lidocaine was used for local anesthesia. Under ultrasound guidance a Safe-T-Centesis catheter was introduced. Thoracentesis was performed. The catheter was removed and a dressing applied. FINDINGS: A total of approximately 320 mL of yellow cloudy fluid was removed. Samples were sent to the laboratory as requested by the clinical team. IMPRESSION: Successful ultrasound guided  diagnostic and therapeutic right thoracentesis yielding 320 mL of pleural fluid. Read by: Brynda Greathouse PA-C Electronically Signed   By: Markus Daft M.D.   On: 07/11/2017 15:47   I personally reviewed the CT images and concur with the findings noted above. Review of Systems  Constitutional: Positive for malaise/fatigue.       Poor appetite  Respiratory: Positive for cough and shortness of breath.   Cardiovascular: Positive for chest pain (rigth side pleuritic).   Blood pressure 128/68, pulse 89, temperature 98 F (36.7 C), temperature source Oral, resp. rate 18, height 5' 6"  (1.676 m), weight 155 lb 8 oz (70.5 kg), SpO2 93 %. Physical Exam  Vitals reviewed. Constitutional: She is oriented to person, place, and time. She appears well-developed and well-nourished. She appears distressed (mild).  HENT:  Head: Normocephalic and atraumatic.  Eyes: Conjunctivae and EOM are normal. No scleral icterus.  Neck: Neck supple. No thyromegaly present.  Cardiovascular: Normal rate, regular rhythm and normal heart sounds.  No murmur heard. Respiratory: Effort normal. No respiratory distress. She has no wheezes. She has no rales.  Absent BS right base, clear on left  GI: Soft. She exhibits no distension. There is no tenderness.  Musculoskeletal: She exhibits no edema.  Lymphadenopathy:    She has no cervical adenopathy.  Neurological: She is alert and oriented to person, place, and time. No cranial nerve deficit. She exhibits normal muscle tone.  Skin: Skin is warm and dry.    Assessment/Plan: 67 yo woman with a history of tobacco and ethanol abuse who presents with right sided pleuritic CP, productive cough, shortness of breath and general malaise. She has a right lower lobe pneumonia complicated by an empyema. In my opinion the best option  in her case is surgical drainage.   She also needs bronchoscopy to r/o endobronchial lesion with right lower lobe atelectasis.  Likely pulmonary hypertension  based on size of PA on CT  I recommended we proceed with bronchoscopy, right VATS, drainage of empyema and decortication. I discussed the general nature of the procedure, the need for general anesthesia, the incisions to be used, and the use of drainage tubes postoperatively with Mrs. Strausser and her family. We discussed the expected hospital stay, overall recovery and short and long term outcomes. I informed her of the indications, risks, benefits and alternatives. They understand the risks include, but are not limited to death, stroke, MI, DVT/PE, bleeding, possible need for transfusion, infections, prolonged air leak, as well as other organ system dysfunction including respiratory, renal, or GI complications.  She accepts the risks and agrees to proceed.  Plan OR in AM  Melrose Nakayama 07/11/2017, 6:20 PM

## 2017-07-11 NOTE — Consult Note (Signed)
Reason for Consult:Right empyema Referring Physician: Dr. Kieth Brightly Erica Ball is an 67 y.o. female.  HPI: 67 yo woman presented with a cc/o CP and SOB  Erica Ball is a 67 yo woman with a past history of tobacco abuse, hypertension, ethanol abuse and allergies. She developed a cough a couple of weeks ago. Sunday night she had sudden onset of right sided chest pain and shortness of breath. Self medicated and went to work on Monday but had to leave early. She finally went to Urgent Care on Wednesday morning. A CXR showed a right effusion. She was sent to the ED and a CT was done. There was no PE but she had a large right effusion. There was no evidence of PE. Thoracentesis drained 320 ml of fluid. LDH was 1100, glucose < 20 and WBC was 10K, all neutrophils.  She had some symptomatic relief with thoracentesis but continues to have Right sided CP. She was started on Zithromax and ceftriaxone initially. Now on Zithromax and Zosyn for presumed aspiration.  Zubrod Score: At the time of surgery this patient's most appropriate activity status/level should be described as: []     0    Normal activity, no symptoms [x]     1    Restricted in physical strenuous activity but ambulatory, able to do out light work []     2    Ambulatory and capable of self care, unable to do work activities, up and about >50 % of waking hours                              []     3    Only limited self care, in bed greater than 50% of waking hours []     4    Completely disabled, no self care, confined to bed or chair []     5    Moribund   Past Medical History:  Diagnosis Date  . Alcohol abuse   . Allergy   . Cataract   . Hypertension   . Tobacco abuse     Past Surgical History:  Procedure Laterality Date  . ABDOMINAL HYSTERECTOMY    . CATARACT EXTRACTION    . FRACTURE SURGERY    . IR THORACENTESIS ASP PLEURAL SPACE W/IMG GUIDE  07/11/2017    Family History  Problem Relation Age of Onset  . Hypertension Mother   .  Heart disease Mother   . Hypertension Father   . Heart disease Father   . Stroke Brother   . Hypertension Brother   . Heart disease Brother     Social History:  reports that she has been smoking cigarettes.  She has been smoking about 1.50 packs per day. she has never used smokeless tobacco. She reports that she drinks about 12.6 oz of alcohol per week. She reports that she does not use drugs.  Allergies:  Allergies  Allergen Reactions  . Dextromethorphan Other (See Comments)    Makes her very angry and she wants to hurt people    Medications:  Prior to Admission:  Medications Prior to Admission  Medication Sig Dispense Refill Last Dose  . albuterol (PROVENTIL HFA;VENTOLIN HFA) 108 (90 Base) MCG/ACT inhaler Inhale 1-2 puffs into the lungs every 4 (four) hours as needed for shortness of breath.   07/10/2017 at Unknown time  . amLODipine (NORVASC) 10 MG tablet Take 1 tablet (10 mg total) by mouth daily. 90 tablet 3 07/10/2017 at  Unknown time  . Aspirin-Salicylamide-Caffeine (BC HEADACHE PO) Take 1 Package by mouth every morning.   Past Week at Unknown time  . azithromycin (ZITHROMAX) 250 MG tablet Take 250-500 mg by mouth See admin instructions. Take 537m on the first day, then take 2588mdaily until gone   07/10/2017 at Unknown time  . Chlorphen-Phenyleph-ASA (ALKA-SELTZER PLUS COLD PO) Take 1-2 tablets by mouth daily as needed (cold sx).   Past Week at Unknown time  . guaiFENesin-codeine 100-10 MG/5ML syrup    07/10/2017 at Unknown time    Results for orders placed or performed during the hospital encounter of 07/10/17 (from the past 48 hour(s))  Comprehensive metabolic panel     Status: Abnormal   Collection Time: 07/10/17  4:25 PM  Result Value Ref Range   Sodium 130 (L) 135 - 145 mmol/L   Potassium 3.8 3.5 - 5.1 mmol/L   Chloride 90 (L) 101 - 111 mmol/L   CO2 26 22 - 32 mmol/L   Glucose, Bld 122 (H) 65 - 99 mg/dL   BUN 19 6 - 20 mg/dL   Creatinine, Ser 1.09 (H) 0.44 - 1.00 mg/dL    Calcium 9.0 8.9 - 10.3 mg/dL   Total Protein 6.7 6.5 - 8.1 g/dL   Albumin 3.0 (L) 3.5 - 5.0 g/dL   AST 12 (L) 15 - 41 U/L   ALT 7 (L) 14 - 54 U/L   Alkaline Phosphatase 78 38 - 126 U/L   Total Bilirubin 0.7 0.3 - 1.2 mg/dL   GFR calc non Af Amer 51 (L) >60 mL/min   GFR calc Af Amer 59 (L) >60 mL/min    Comment: (NOTE) The eGFR has been calculated using the CKD EPI equation. This calculation has not been validated in all clinical situations. eGFR's persistently <60 mL/min signify possible Chronic Kidney Disease.    Anion gap 14 5 - 15    Comment: Performed at [facility] [place] [id number] with Differential     Status: Abnormal   Collection Time: 07/10/17  4:25 PM  Result Value Ref Range   WBC 21.3 (H) 4.0 - 10.5 K/uL   RBC 4.38 3.87 - 5.11 MIL/uL   Hemoglobin 15.8 (H) 12.0 - 15.0 g/dL   HCT 45.3 36.0 - 46.0 %   MCV 103.4 (H) 78.0 - 100.0 fL   MCH 36.1 (H) 26.0 - 34.0 pg   MCHC 34.9 30.0 - 36.0 g/dL   RDW 12.5 11.5 - 15.5 %   Platelets 314 150 - 400 K/uL   Neutrophils Relative % 91 %   Neutro Abs 19.4 (H) 1.7 - 7.7 K/uL   Lymphocytes Relative 5 %   Lymphs Abs 1.0 0.7 - 4.0 K/uL   Monocytes Relative 4 %   Monocytes Absolute 0.9 0.1 - 1.0 K/uL   Eosinophils Relative 0 %   Eosinophils Absolute 0.0 0.0 - 0.7 K/uL   Basophils Relative 0 %   Basophils Absolute 0.0 0.0 - 0.1 K/uL    Comment: Performed at [facility] [place] [id number] natriuretic peptide     Status: Abnormal   Collection Time: 07/10/17  4:25 PM  Result Value Ref Range   B Natriuretic Peptide 176.3 (H) 0.0 - 100.0 pg/mL    Comment: Performed at [facility] [place] [id number] Culture (routine x 2)     Status: None (Preliminary result)  Collection Time: 07/10/17  4:34 PM  Result Value Ref Range   Specimen Description BLOOD RIGHT ANTECUBITAL    Special Requests      BOTTLES DRAWN AEROBIC AND  ANAEROBIC Blood Culture adequate volume   Culture      NO GROWTH < 24 HOURS Performed at [facility] [address], [place], [place] [address]    Report Status PENDING   Blood Culture (routine x 2)     Status: None (Preliminary result)   Collection Time: 07/10/17  4:34 PM  Result Value Ref Range   Specimen Description BLOOD RIGHT ANTECUBITAL    Special Requests IN PEDIATRIC BOTTLE Blood Culture adequate volume    Culture      NO GROWTH < 24 HOURS Performed at [facility], [place] [address], [place], [place] [address]    Report Status PENDING   I-Stat CG4 Lactic Acid, ED     Status: Abnormal   Collection Time: 07/10/17  4:51 PM  Result Value Ref Range   Lactic Acid, Venous 2.42 (HH) 0.5 - 1.9 mmol/L   Comment NOTIFIED PHYSICIAN   Troponin I     Status: Abnormal   Collection Time: 07/10/17  5:36 PM  Result Value Ref Range   Troponin I 0.49 (HH) <0.03 ng/mL    Comment: CRITICAL RESULT CALLED TO, READ BACK BY AND VERIFIED WITH: A.GORTON,RN 07/10/17 1854 DAVISB Performed at [facility], [place] [address], [place], [place] [address]   Urinalysis, Routine w reflex microscopic     Status: Abnormal   Collection Time: 07/10/17  6:39 PM  Result Value Ref Range   Color, Urine AMBER (A) YELLOW    Comment: BIOCHEMICALS MAY BE AFFECTED BY COLOR   APPearance CLOUDY (A) CLEAR   Specific Gravity, Urine 1.020 1.005 - 1.030   pH 5.0 5.0 - 8.0   Glucose, UA NEGATIVE NEGATIVE mg/dL   Hgb urine dipstick SMALL (A) NEGATIVE   Bilirubin Urine NEGATIVE NEGATIVE   Ketones, ur NEGATIVE NEGATIVE mg/dL   Protein, ur 30 (A) NEGATIVE mg/dL   Nitrite NEGATIVE NEGATIVE   Leukocytes, UA MODERATE (A) NEGATIVE   RBC / HPF 6-30 0 - 5 RBC/hpf   WBC, UA TOO NUMEROUS TO COUNT 0 - 5 WBC/hpf   Bacteria, UA MANY (A) NONE SEEN   Squamous Epithelial / LPF TOO NUMEROUS TO COUNT (A) NONE SEEN   Mucus PRESENT    Hyaline Casts, UA PRESENT     Comment: Performed at [facility], [place] [address] [place], [place] [address]  Strep pneumoniae urinary antigen     Status: None   Collection Time: 07/10/17  6:39 PM  Result Value Ref Range   Strep Pneumo Urinary Antigen NEGATIVE NEGATIVE    Comment:        Infection due to S. pneumoniae cannot be absolutely ruled out since the antigen present may be below the detection limit of the test. Performed at [facility], [place] [address], [place], [place] [address]   I-Stat CG4 Lactic Acid, ED     Status: Abnormal   Collection Time: 07/10/17  6:55 PM  Result Value Ref Range   Lactic Acid, Venous 2.51 (HH) 0.5 - 1.9 mmol/L   Comment NOTIFIED PHYSICIAN   Troponin I     Status: Abnormal   Collection Time: 07/10/17  8:05 PM  Result Value Ref Range   Troponin I 0.55 (HH) <0.03 ng/mL    Comment: CRITICAL VALUE NOTED.  VALUE IS CONSISTENT WITH PREVIOUSLY REPORTED AND CALLED VALUE. Performed at [facility], [place] [address] [place], [place] [address]   Influenza panel by PCR (type A & B)     Status: None   Collection Time: 07/10/17  8:30 PM  Result Value Ref Range   Influenza A By PCR NEGATIVE NEGATIVE   Influenza B By PCR NEGATIVE NEGATIVE    Comment: (NOTE) The Xpert Xpress Flu assay is intended as an aid in the diagnosis of  influenza and should not be used as a sole basis for treatment.  This  assay is FDA approved for nasopharyngeal swab specimens only. Nasal  washings and aspirates are unacceptable for Xpert Xpress Flu testing. Performed at [facility], [place] [address] [place] [address], [place],  [address]   Procalcitonin     Status: None   Collection Time: 07/10/17  9:10 PM  Result Value Ref Range   Procalcitonin 1.21 ng/mL    Comment:        Interpretation: PCT > 0.5 ng/mL and <= 2 ng/mL: Systemic infection (sepsis) is possible, but other conditions are known to elevate PCT as well. (NOTE)       Sepsis PCT Algorithm           Lower Respiratory Tract                                      Infection PCT Algorithm     ----------------------------     ----------------------------         PCT < 0.25 ng/mL                PCT < 0.10 ng/mL         Strongly encourage             Strongly discourage   discontinuation of antibiotics    initiation of antibiotics    ----------------------------     -----------------------------       PCT 0.25 - 0.50 ng/mL            PCT 0.10 - 0.25 ng/mL               OR       >80% decrease in PCT            Discourage initiation of                                            antibiotics      Encourage discontinuation           of antibiotics    ----------------------------     -----------------------------         PCT >= 0.50 ng/mL              PCT 0.26 - 0.50 ng/mL                AND       <80% decrease in PCT             Encourage initiation of                                             antibiotics  Encourage continuation           of antibiotics    ----------------------------     -----------------------------        PCT >= 0.50 ng/mL                  PCT > 0.50 ng/mL               AND         increase in PCT                  Strongly encourage                                      initiation of antibiotics    Strongly encourage escalation           of antibiotics                                     -----------------------------                                           PCT <= 0.25 ng/mL                                                 OR                                        > 80% decrease in PCT                                     Discontinue / Do not initiate                                             antibiotics Performed at [facility], [address] [place], [place] [address]   Protime-INR     Status: None   Collection Time: 07/10/17  9:10 PM  Result Value Ref Range   Prothrombin Time 13.9 11.4 - 15.2 seconds   INR 1.07     Comment: Performed at [facility] [address], [place], [place] [address]  APTT     Status: None    Collection Time: 07/10/17  9:10 PM  Result Value Ref Range   aPTT 33 24 - 36 seconds    Comment: Performed at [facility] [address] [place], [place] [address]  HIV antibody     Status: None   Collection Time: 07/10/17  9:10 PM  Result Value Ref Range   HIV Screen 4th Generation wRfx Non Reactive Non Reactive    Comment: (NOTE) Performed At: [facility] [address] [place], [place] [id number] Rush Farmer MD OF:[id number] Performed at [facility], [place] [address], [place], [place] [address]  Lactic acid, plasma     Status: None   Collection Time: 07/11/17 12:05 AM  Result Value Ref Range   Lactic Acid, Venous 1.3 0.5 - 1.9 mmol/L    Comment: Performed at [facility] [address] [place], [place] [address]  Hemoglobin A1c     Status: None   Collection Time: 07/11/17  3:04 AM  Result Value Ref Range   Hgb A1c MFr Bld 5.0 4.8 - 5.6 %    Comment: (NOTE) Pre diabetes:          5.7%-6.4% Diabetes:              >6.4% Glycemic control for   <7.0% adults with diabetes    Mean Plasma Glucose 96.8 mg/dL    Comment: Performed at [facility] [address] [place], [place] [address]  Lipid panel     Status: None   Collection Time: 07/11/17  3:04 AM  Result Value Ref Range   Cholesterol 88 0 - 200 mg/dL   Triglycerides 47 <150 mg/dL   HDL 41 >40 mg/dL   Total CHOL/HDL Ratio 2.1 RATIO   VLDL 9 0 - 40 mg/dL   LDL Cholesterol 38 0 - 99 mg/dL    Comment:        Total Cholesterol/HDL:CHD Risk Coronary Heart Disease Risk Table                     Men   Women  1/2 Average Risk   3.4   3.3  Average Risk       5.0   4.4  2 X Average Risk   9.6   7.1  3 X Average Risk  23.4   11.0        Use the calculated Patient Ratio above and the CHD Risk Table to determine the patient's CHD Risk.        ATP III CLASSIFICATION (LDL):  <100     mg/dL   Optimal  100-129  mg/dL   Near or Above                    Optimal  130-159  mg/dL    Borderline  160-189  mg/dL   High  >190     mg/dL   Very High Performed at [facility] [address] [place], [place] [address]   Lactic acid, plasma     Status: None   Collection Time: 07/11/17  3:04 AM  Result Value Ref Range   Lactic Acid, Venous 0.9 0.5 - 1.9 mmol/L    Comment: Performed at [facility] [address], [place], [place] [address]  TSH     Status: None   Collection Time: 07/11/17  3:04 AM  Result Value Ref Range   TSH 2.922 0.350 - 4.500 uIU/mL    Comment: Performed by a 3rd Generation assay with a functional sensitivity of <=0.01 uIU/mL. Performed at [facility], [place] [address], [place], [place] [address]   Rapid urine drug screen (hospital performed)     Status: Abnormal   Collection Time: 07/11/17  3:46 AM  Result Value Ref Range   Opiates POSITIVE (A) NONE DETECTED   Cocaine NONE DETECTED NONE DETECTED   Benzodiazepines NONE DETECTED NONE DETECTED   Amphetamines NONE DETECTED NONE DETECTED   Tetrahydrocannabinol NONE DETECTED NONE DETECTED   Barbiturates NONE DETECTED NONE DETECTED    Comment: (NOTE) DRUG SCREEN FOR  MEDICAL PURPOSES ONLY.  IF CONFIRMATION IS NEEDED FOR ANY PURPOSE, NOTIFY LAB WITHIN 5 DAYS. LOWEST DETECTABLE LIMITS FOR URINE DRUG SCREEN Drug Class                     Cutoff (ng/mL) Amphetamine and metabolites    1000 Barbiturate and metabolites    200 Benzodiazepine                 086 Tricyclics and metabolites     300 Opiates and metabolites        300 Cocaine and metabolites        300 THC                            50 Performed at [facility], [place] [address] [place], [place] [address]   Troponin I     Status: Abnormal   Collection Time: 07/11/17  7:04 AM  Result Value Ref Range   Troponin I 0.12 (HH) <0.03 ng/mL    Comment: CRITICAL VALUE NOTED.  VALUE IS CONSISTENT WITH PREVIOUSLY REPORTED AND CALLED VALUE. Performed at [facility], [place] [address], [place], [place] [address]    Culture, sputum-assessment     Status: None   Collection Time: 07/11/17 11:54 AM  Result Value Ref Range   Specimen Description EXPECTORATED SPUTUM    Special Requests NONE    Sputum evaluation      Sputum specimen not acceptable for testing.  Please recollect.   Gram Stain Report Called to,Read Back By and Verified With: RN Stephens Shire [id number] MLM Performed at [facility] [address] [place], [place] [address]    Report Status 07/11/2017 FINAL   Lactate dehydrogenase (pleural or peritoneal fluid)     Status: Abnormal   Collection Time: 07/11/17  1:28 PM  Result Value Ref Range   LD, Fluid 1,193 (H) 3 - 23 U/L    Comment: (NOTE) Results should be evaluated in conjunction with serum values    Fluid Type-FLDH Pleural R     Comment: Performed at [facility] [address] [place], [place] [address]  Body fluid cell count with differential     Status: Abnormal   Collection Time: 07/11/17  1:28 PM  Result Value Ref Range   Fluid Type-FCT Pleural R    Color, Fluid YELLOW YELLOW   Appearance, Fluid CLOUDY (A) CLEAR   WBC, Fluid 10,000 (H) 0 - 1,000 cu mm    Comment: COUNT MAY BE INACCURATE DUE TO FIBRIN CLUMPS.   Neutrophil Count, Fluid 100 (H) 0 - 25 %   Lymphs, Fluid 0 %   Monocyte-Macrophage-Serous Fluid 0 (L) 50 - 90 %   Eos, Fluid 0 %    Comment: Performed at [facility] [address] [place], [place] [address]  Albumin, pleural or peritoneal fluid     Status: None   Collection Time: 07/11/17  1:28 PM  Result Value Ref Range   Albumin, Fluid 2.1 g/dL    Comment: (NOTE) No normal range established for this test Results should be evaluated in conjunction with serum values    Fluid Type-FALB Pleural R     Comment: Performed at [facility] [address], [place], [place] [address]  Protein, pleural or peritoneal fluid     Status: None   Collection Time: 07/11/17  1:28 PM  Result Value Ref  Range   Total protein, fluid 3.9 g/dL     Comment: (NOTE) No normal range established for this test Results should be evaluated in conjunction with serum values    Fluid Type-FTP Pleural R     Comment: Performed at [facility], [place] [address] [place], [place] [address]  Glucose, pleural or peritoneal fluid     Status: None   Collection Time: 07/11/17  1:28 PM  Result Value Ref Range   Glucose, Fluid <20 mg/dL   Fluid Type-FGLU Pleural R     Comment: Performed at [facility] [address], [place], [place] [address]  Troponin I (q 6hr x 3)     Status: None   Collection Time: 07/11/17  2:06 PM  Result Value Ref Range   Troponin I <0.03 <0.03 ng/mL    Comment: Performed at [facility] [address], [place], [place] [address]    Dg Chest 1 View  Result Date: 07/11/2017 CLINICAL DATA:  Patient status post right thoracentesis. EXAM: CHEST 1 VIEW COMPARISON:  Chest radiograph 07/10/2017 FINDINGS: Monitoring leads overlie the patient. Stable cardiac and mediastinal contours. Persistent moderate right pleural effusion. Underlying consolidation right lower lung. Left basilar atelectasis. No pneumothorax. IMPRESSION: Redemonstrated moderate right pleural effusion with underlying consolidation. Electronically Signed   By: Lovey Newcomer M.D.   On: 07/11/2017 13:39   Ct Angio Chest Pe W/cm &/or Wo Cm  Result Date: 07/10/2017 CLINICAL DATA:  67 year old female with right-sided chest pain, suspect pulmonary embolus EXAM: CT ANGIOGRAPHY CHEST WITH CONTRAST TECHNIQUE: Multidetector CT imaging of the chest was performed using the standard protocol during bolus administration of intravenous contrast. Multiplanar CT image reconstructions and MIPs were obtained to evaluate the vascular anatomy. CONTRAST:  100 mL Isovue 370 COMPARISON:  Chest x-ray obtained earlier today; prior CT scan of the chest 10/01/2004 FINDINGS: Cardiovascular: Satisfactory opacification of the pulmonary arteries to the segmental level. No evidence of  pulmonary embolism. Normal heart size. No pericardial effusion. The main pulmonary artery is enlarged at 3.3 cm. Trace aortic atherosclerotic calcifications. Calcifications are present throughout the coronary arteries. The heart is normal in size. No pericardial effusion. Mediastinum/Nodes: Unremarkable CT appearance of the thyroid gland. No suspicious mediastinal or hilar adenopathy. No soft tissue mediastinal mass. The thoracic esophagus is unremarkable. Lungs/Pleura: Large right-sided pleural effusion with associated atelectasis of the entire right lower lobe and a portion of the inferior right middle lobe. There is a smaller loculated pleural effusion at the right lung apex. Complete occlusion of the right lower lobe bronchus with what appears to be secretions. No definite enhancing mass. Mild elevation of the left hemidiaphragm. Subsegmental left lower lobe atelectasis. Upper Abdomen: No acute abnormality. Musculoskeletal: No chest wall abnormality. No acute or significant osseous findings. Review of the MIP images confirms the above findings. IMPRESSION: 1. Large right-sided pleural effusion with complete atelectasis of the right lower lobe and partial atelectasis of the inferior right middle lobe. Of note, the right lower lobe bronchus is completely obstructed by what appears to be secretions/mucus plugging. An endobronchial lesion is considered less likely but not excluded. The pleural effusion may be secondary to chronic atelectasis from this obstructed bronchus rather than the primary pathology. Recommend referral to pulmonology for bronchoscopy. 2. Smaller loculated pleural effusion at the posterior aspect of the right apex. 3. Enlarged main pulmonary artery as can be seen in the setting of pulmonary arterial hypertension. 4.  Aortic Atherosclerosis (ICD10-170.0). 5. Coronary artery calcifications. Electronically  Signed   By: Jacqulynn Cadet M.D.   On: 07/10/2017 19:41   Dg Chest Port 1 View  Result  Date: 07/10/2017 CLINICAL DATA:  67 year old female with right chest and back pain EXAM: PORTABLE CHEST 1 VIEW COMPARISON:  Prior chest x-ray 10/30/2011 FINDINGS: Dense right basilar opacity with complete obscure a shin of the right hemidiaphragm. Veil like opacity in the right mid lung. The left lung appears clear. The cardiac mediastinal contours are grossly within normal limits. Atherosclerotic calcification is present in the transverse aorta. No pneumothorax. IMPRESSION: Dense right basilar opacity likely represents a combination of pleural fluid with either atelectasis or infiltrate/pneumonia. An underlying pulmonary nodule or mass is difficult to exclude radiographically. Aortic Atherosclerosis (ICD10-170.0) Electronically Signed   By: Jacqulynn Cadet M.D.   On: 07/10/2017 18:07   Ir Thoracentesis Asp Pleural Space W/img Guide  Result Date: 07/11/2017 INDICATION: Patient with right pleural effusion. Request is made for diagnostic and therapeutic thoracentesis. EXAM: ULTRASOUND GUIDED DIAGNOSTIC AND THERAPEUTIC RIGHT THORACENTESIS MEDICATIONS: 10 mL 1% lidocaine COMPLICATIONS: None immediate. PROCEDURE: An ultrasound guided thoracentesis was thoroughly discussed with the patient and questions answered. The benefits, risks, alternatives and complications were also discussed. The patient understands and wishes to proceed with the procedure. Written consent was obtained. Ultrasound was performed to localize and mark an adequate pocket of fluid in the right chest. The area was then prepped and draped in the normal sterile fashion. 1% Lidocaine was used for local anesthesia. Under ultrasound guidance a Safe-T-Centesis catheter was introduced. Thoracentesis was performed. The catheter was removed and a dressing applied. FINDINGS: A total of approximately 320 mL of yellow cloudy fluid was removed. Samples were sent to the laboratory as requested by the clinical team. IMPRESSION: Successful ultrasound guided  diagnostic and therapeutic right thoracentesis yielding 320 mL of pleural fluid. Read by: Brynda Greathouse PA-C Electronically Signed   By: Markus Daft M.D.   On: 07/11/2017 15:47   I personally reviewed the CT images and concur with the findings noted above. Review of Systems  Constitutional: Positive for malaise/fatigue.       Poor appetite  Respiratory: Positive for cough and shortness of breath.   Cardiovascular: Positive for chest pain (rigth side pleuritic).   Blood pressure 128/68, pulse 89, temperature 98 F (36.7 C), temperature source Oral, resp. rate 18, height 5' 6"  (1.676 m), weight 155 lb 8 oz (70.5 kg), SpO2 93 %. Physical Exam  Vitals reviewed. Constitutional: She is oriented to person, place, and time. She appears well-developed and well-nourished. She appears distressed (mild).  HENT:  Head: Normocephalic and atraumatic.  Eyes: Conjunctivae and EOM are normal. No scleral icterus.  Neck: Neck supple. No thyromegaly present.  Cardiovascular: Normal rate, regular rhythm and normal heart sounds.  No murmur heard. Respiratory: Effort normal. No respiratory distress. She has no wheezes. She has no rales.  Absent BS right base, clear on left  GI: Soft. She exhibits no distension. There is no tenderness.  Musculoskeletal: She exhibits no edema.  Lymphadenopathy:    She has no cervical adenopathy.  Neurological: She is alert and oriented to person, place, and time. No cranial nerve deficit. She exhibits normal muscle tone.  Skin: Skin is warm and dry.    Assessment/Plan: 67 yo woman with a history of tobacco and ethanol abuse who presents with right sided pleuritic CP, productive cough, shortness of breath and general malaise. She has a right lower lobe pneumonia complicated by an empyema. In my opinion the best option  in her case is surgical drainage.   She also needs bronchoscopy to r/o endobronchial lesion with right lower lobe atelectasis.  Likely pulmonary hypertension  based on size of PA on CT  I recommended we proceed with bronchoscopy, right VATS, drainage of empyema and decortication. I discussed the general nature of the procedure, the need for general anesthesia, the incisions to be used, and the use of drainage tubes postoperatively with Erica Ball and her family. We discussed the expected hospital stay, overall recovery and short and long term outcomes. I informed her of the indications, risks, benefits and alternatives. They understand the risks include, but are not limited to death, stroke, MI, DVT/PE, bleeding, possible need for transfusion, infections, prolonged air leak, as well as other organ system dysfunction including respiratory, renal, or GI complications.  She accepts the risks and agrees to proceed.  Plan OR in AM  Melrose Nakayama 07/11/2017, 6:20 PM

## 2017-07-11 NOTE — Consult Note (Signed)
PULMONARY / CRITICAL CARE MEDICINE   Name: Erica Ball MRN: [id number] DOB: [birth year]    ADMISSION DATE:  07/10/2017 CONSULTATION DATE:  07/11/2017  REFERRING MD:  Dr. Catha Gosselin  CHIEF COMPLAINT: Short of breath  HISTORY OF PRESENT ILLNESS:   67 yo female sent to ER for assessment of pneumonia.  She was having cough, chest pain and weakness.  She has hx of tobacco abuse and ETOH.  She developed Rt pleuritic chest pain.  She was started on supplemental oxygen and antibiotics.  CT chest showed large Rt effusion and atelectasis with concern for mucus plugging.  She had thoracentesis by IR with 320 ml out.  Pleural fluid analysis was consistent with an empyema.  PAST MEDICAL HISTORY :  She  has a past medical history of Alcohol abuse, Allergy, Cataract, Hypertension, and Tobacco abuse.  PAST SURGICAL HISTORY: She  has a past surgical history that includes Fracture surgery; Abdominal hysterectomy; and Cataract extraction.  Allergies  Allergen Reactions  . Dextromethorphan Other (See Comments)    Makes her very angry and she wants to hurt people    No current facility-administered medications on file prior to encounter.    Current Outpatient Medications on File Prior to Encounter  Medication Sig  . albuterol (PROVENTIL HFA;VENTOLIN HFA) 108 (90 Base) MCG/ACT inhaler Inhale 1-2 puffs into the lungs every 4 (four) hours as needed for shortness of breath.  Marland Kitchen amLODipine (NORVASC) 10 MG tablet Take 1 tablet (10 mg total) by mouth daily.  . Aspirin-Salicylamide-Caffeine (BC HEADACHE PO) Take 1 Package by mouth every morning.  Marland Kitchen azithromycin (ZITHROMAX) 250 MG tablet Take 250-500 mg by mouth See admin instructions. Take 500mg  on the first day, then take 250mg  daily until gone  . Chlorphen-Phenyleph-ASA (ALKA-SELTZER PLUS COLD PO) Take 1-2 tablets by mouth daily as needed (cold sx).  Marland Kitchen guaiFENesin-codeine 100-10 MG/5ML syrup     FAMILY HISTORY:  Her indicated that her mother is deceased.  She indicated that her father is deceased. She indicated that her sister is alive. She indicated that her brother is alive.   SOCIAL HISTORY: She  reports that she has been smoking cigarettes.  She has been smoking about 1.50 packs per day. she has never used smokeless tobacco. She reports that she drinks about 12.6 oz of alcohol per week. She reports that she does not use drugs.  REVIEW OF SYSTEMS:  12 point ROS negative except above.  SUBJECTIVE:   VITAL SIGNS: BP 131/73 (BP Location: Right Arm)   Pulse 100   Temp 99 F (37.2 C) (Oral)   Resp 16   Ht 5\' 6"  (1.676 m)   Wt 155 lb 8 oz (70.5 kg)   SpO2 92%   BMI 25.10 kg/m   INTAKE / OUTPUT: I/O last 3 completed shifts: In: 3168.3 [P.O.:240; I.V.:828.3; IV Piggyback:2100] Out: 400 [Urine:400]  PHYSICAL EXAMINATION:  General - pleasant Eyes - pupils reactive, wears glasses ENT - no sinus tenderness, no oral exudate, no LAN Cardiac - regular, no murmur Chest - decreased BS Rt base Abd - soft, non tender Ext - no edema Skin - no rashes Neuro - normal strength   LABS:  BMET Recent Labs  Lab 07/10/17 1625  NA 130*  K 3.8  CL 90*  CO2 26  BUN 19  CREATININE 1.09*  GLUCOSE 122*    Electrolytes Recent Labs  Lab 07/10/17 1625  CALCIUM 9.0    CBC Recent Labs  Lab 07/10/17 1625  WBC 21.3*  HGB 15.8*  HCT 45.3  PLT 314    Coag's Recent Labs  Lab 07/10/17 2110  APTT 33  INR 1.07    Sepsis Markers Recent Labs  Lab 07/10/17 1855 07/10/17 2110 07/11/17 0005 07/11/17 0304  LATICACIDVEN 2.51*  --  1.3 0.9  PROCALCITON  --  1.21  --   --     ABG No results for input(s): PHART, PCO2ART, PO2ART in the last 168 hours.  Liver Enzymes Recent Labs  Lab 07/10/17 1625  AST 12*  ALT 7*  ALKPHOS 78  BILITOT 0.7  ALBUMIN 3.0*    Cardiac Enzymes Recent Labs  Lab 07/10/17 1736 07/10/17 2005 07/11/17 0704  TROPONINI 0.49* 0.55* 0.12*    Glucose No results for input(s): GLUCAP in the  last 168 hours.  Imaging Dg Chest 1 View  Result Date: 07/11/2017 CLINICAL DATA:  Patient status post right thoracentesis. EXAM: CHEST 1 VIEW COMPARISON:  Chest radiograph 07/10/2017 FINDINGS: Monitoring leads overlie the patient. Stable cardiac and mediastinal contours. Persistent moderate right pleural effusion. Underlying consolidation right lower lung. Left basilar atelectasis. No pneumothorax. IMPRESSION: Redemonstrated moderate right pleural effusion with underlying consolidation. Electronically Signed   By: Annia Belt M.D.   On: 07/11/2017 13:39   Ct Angio Chest Pe W/cm &/or Wo Cm  Result Date: 07/10/2017 CLINICAL DATA:  67 year old female with right-sided chest pain, suspect pulmonary embolus EXAM: CT ANGIOGRAPHY CHEST WITH CONTRAST TECHNIQUE: Multidetector CT imaging of the chest was performed using the standard protocol during bolus administration of intravenous contrast. Multiplanar CT image reconstructions and MIPs were obtained to evaluate the vascular anatomy. CONTRAST:  100 mL Isovue 370 COMPARISON:  Chest x-ray obtained earlier today; prior CT scan of the chest 10/01/2004 FINDINGS: Cardiovascular: Satisfactory opacification of the pulmonary arteries to the segmental level. No evidence of pulmonary embolism. Normal heart size. No pericardial effusion. The main pulmonary artery is enlarged at 3.3 cm. Trace aortic atherosclerotic calcifications. Calcifications are present throughout the coronary arteries. The heart is normal in size. No pericardial effusion. Mediastinum/Nodes: Unremarkable CT appearance of the thyroid gland. No suspicious mediastinal or hilar adenopathy. No soft tissue mediastinal mass. The thoracic esophagus is unremarkable. Lungs/Pleura: Large right-sided pleural effusion with associated atelectasis of the entire right lower lobe and a portion of the inferior right middle lobe. There is a smaller loculated pleural effusion at the right lung apex. Complete occlusion of the  right lower lobe bronchus with what appears to be secretions. No definite enhancing mass. Mild elevation of the left hemidiaphragm. Subsegmental left lower lobe atelectasis. Upper Abdomen: No acute abnormality. Musculoskeletal: No chest wall abnormality. No acute or significant osseous findings. Review of the MIP images confirms the above findings. IMPRESSION: 1. Large right-sided pleural effusion with complete atelectasis of the right lower lobe and partial atelectasis of the inferior right middle lobe. Of note, the right lower lobe bronchus is completely obstructed by what appears to be secretions/mucus plugging. An endobronchial lesion is considered less likely but not excluded. The pleural effusion may be secondary to chronic atelectasis from this obstructed bronchus rather than the primary pathology. Recommend referral to pulmonology for bronchoscopy. 2. Smaller loculated pleural effusion at the posterior aspect of the right apex. 3. Enlarged main pulmonary artery as can be seen in the setting of pulmonary arterial hypertension. 4.  Aortic Atherosclerosis (ICD10-170.0). 5. Coronary artery calcifications. Electronically Signed   By: Malachy Moan M.D.   On: 07/10/2017 19:41   Dg Chest Port 1 View  Result Date: 07/10/2017 CLINICAL DATA:  67 year old  female with right chest and back pain EXAM: PORTABLE CHEST 1 VIEW COMPARISON:  Prior chest x-ray 10/30/2011 FINDINGS: Dense right basilar opacity with complete obscure a shin of the right hemidiaphragm. Veil like opacity in the right mid lung. The left lung appears clear. The cardiac mediastinal contours are grossly within normal limits. Atherosclerotic calcification is present in the transverse aorta. No pneumothorax. IMPRESSION: Dense right basilar opacity likely represents a combination of pleural fluid with either atelectasis or infiltrate/pneumonia. An underlying pulmonary nodule or mass is difficult to exclude radiographically. Aortic Atherosclerosis  (ICD10-170.0) Electronically Signed   By: Malachy MoanHeath  McCullough M.D.   On: 07/10/2017 18:07     STUDIES:  Rt pleural fluid 3/07 >> glucose less than 20, LDH 1193, protein 3.9, WBC 10,0000  CULTURES: Blood 3/06 >> Rt pleural fluid 3/07 >>  ANTIBIOTICS: Rocephin 3/06 >> 3/07 Zithromax 3/06 >> Zosyn 3/07 >>  SIGNIFICANT EVENTS: 3/06 Admit 3/07 TCTS consulted  LINES/TUBES:  DISCUSSION: 67 yo female smoker with hx of ETOH with pneumonia likely from aspiration with partially loculated right pleural effusion.  Fluid analysis consistent with empyema.  ASSESSMENT / PLAN:  Aspiration pneumonia with Rt empyema. - change abx to zosyn and continue zithromax - f/u cultures - consulted TCTS to assess for best approach to pleural fluid drainage - keep NPO in case she needs surgical intervention  Acute hypoxic respiratory failure. - oxygen to keep SpO2 > 92%  Hx of ETOH. - CIWA protocol per primary team  Updated pt's family at bedside.  Coralyn HellingVineet Naksh Radi, MD [facility] Pulmonary[facility]Critical Care 07/11/2017, 3:17 PM Pager:  [phone] After 3pm call: [phone]

## 2017-07-11 NOTE — Plan of Care (Signed)
  Activity: Risk for activity intolerance will decrease 07/11/2017 0422 - Progressing by Carolanne GrumblingWall, Carena Stream C, RN  Safety discussed with patient as pt. Is SOB with exertion and at rest. Pt. Told to call for assistance when needed. Pt. Able to ambulate to the restroom independently without incident. RN will continue to monitor the patient. Call light placed within reach.

## 2017-07-12 ENCOUNTER — Inpatient Hospital Stay (HOSPITAL_COMMUNITY): Payer: BC Managed Care – PPO

## 2017-07-12 ENCOUNTER — Encounter (HOSPITAL_COMMUNITY)
Admission: EM | Disposition: A | Discharge status: Home / Self Care | Payer: Self-pay | Source: Home / Self Care | Attending: Internal Medicine

## 2017-07-12 ENCOUNTER — Inpatient Hospital Stay (HOSPITAL_COMMUNITY): Payer: BC Managed Care – PPO | Admitting: Anesthesiology

## 2017-07-12 ENCOUNTER — Encounter (HOSPITAL_COMMUNITY): Payer: Self-pay | Admitting: Surgery

## 2017-07-12 DIAGNOSIS — I361 Nonrheumatic tricuspid (valve) insufficiency: Secondary | ICD-10-CM

## 2017-07-12 HISTORY — PX: VIDEO ASSISTED THORACOSCOPY (VATS)/EMPYEMA: SHX6172

## 2017-07-12 HISTORY — PX: VIDEO BRONCHOSCOPY: SHX5072

## 2017-07-12 LAB — ECHOCARDIOGRAM COMPLETE
Height: 66 in
Weight: 2531.2 oz

## 2017-07-12 LAB — CBC
HCT: 36.6 % (ref 36.0–46.0)
Hemoglobin: 12.1 g/dL (ref 12.0–15.0)
MCH: 34.6 pg — ABNORMAL HIGH (ref 26.0–34.0)
MCHC: 33.1 g/dL (ref 30.0–36.0)
MCV: 104.6 fL — ABNORMAL HIGH (ref 78.0–100.0)
Platelets: 320 10*3/uL (ref 150–400)
RBC: 3.5 MIL/uL — ABNORMAL LOW (ref 3.87–5.11)
RDW: 13 % (ref 11.5–15.5)
WBC: 11.1 10*3/uL — ABNORMAL HIGH (ref 4.0–10.5)

## 2017-07-12 LAB — BASIC METABOLIC PANEL
Anion gap: 11 (ref 5–15)
BUN: <5 mg/dL — ABNORMAL LOW (ref 6–20)
CO2: 25 mmol/L (ref 22–32)
Calcium: 7.5 mg/dL — ABNORMAL LOW (ref 8.9–10.3)
Chloride: 95 mmol/L — ABNORMAL LOW (ref 101–111)
Creatinine, Ser: 0.54 mg/dL (ref 0.44–1.00)
GFR calc Af Amer: >60 mL/min (ref >60)
GFR calc non Af Amer: >60 mL/min (ref >60)
Glucose, Bld: 84 mg/dL (ref 65–99)
Potassium: 3.3 mmol/L — ABNORMAL LOW (ref 3.5–5.1)
Sodium: 131 mmol/L — ABNORMAL LOW (ref 135–145)

## 2017-07-12 LAB — LEGIONELLA PNEUMOPHILA SEROGP 1 UR AG: L. PNEUMOPHILA SEROGP 1 UR AG: NEGATIVE

## 2017-07-12 LAB — SURGICAL PCR SCREEN
MRSA, PCR: NEGATIVE
Staphylococcus aureus: NEGATIVE

## 2017-07-12 LAB — URINE CULTURE

## 2017-07-12 SURGERY — VIDEO ASSISTED THORACOSCOPY (VATS)/EMPYEMA
Anesthesia: General | Site: Chest | Laterality: Right

## 2017-07-12 MED ORDER — DEXTROSE-NACL 5-0.9 % IV SOLN
INTRAVENOUS | Status: DC
  Administered 2017-07-12: 22:00:00 via INTRAVENOUS
  Administered 2017-07-13: 100 mL/h via INTRAVENOUS

## 2017-07-12 MED ORDER — ONDANSETRON HCL 4 MG/2ML IJ SOLN: 4.0000 mg | Freq: Four times a day (QID) | INTRAMUSCULAR | Status: DC | PRN

## 2017-07-12 MED ORDER — MIDAZOLAM HCL 2 MG/2ML IJ SOLN
INTRAMUSCULAR | Status: DC | PRN
  Administered 2017-07-12 (×2): 1 mg via INTRAVENOUS

## 2017-07-12 MED ORDER — HYDROMORPHONE HCL 1 MG/ML IJ SOLN
0.2500 mg | INTRAMUSCULAR | Status: DC | PRN
  Administered 2017-07-12: 0.25 mg via INTRAVENOUS

## 2017-07-12 MED ORDER — SENNOSIDES-DOCUSATE SODIUM 8.6-50 MG PO TABS
1.0000 | ORAL_TABLET | Freq: Every day | ORAL | Status: DC
  Administered 2017-07-12 – 2017-07-13 (×2): 1 via ORAL
  Filled 2017-07-12 (×3): qty 1

## 2017-07-12 MED ORDER — ROCURONIUM BROMIDE 10 MG/ML (PF) SYRINGE
PREFILLED_SYRINGE | INTRAVENOUS | Status: DC | PRN
  Administered 2017-07-12: 70 mg via INTRAVENOUS

## 2017-07-12 MED ORDER — SUGAMMADEX SODIUM 200 MG/2ML IV SOLN
INTRAVENOUS | Status: AC
  Filled 2017-07-12: qty 2

## 2017-07-12 MED ORDER — OXYCODONE HCL 5 MG PO TABS
5.0000 mg | ORAL_TABLET | ORAL | Status: DC | PRN
  Administered 2017-07-16 – 2017-07-18 (×8): 10 mg via ORAL
  Filled 2017-07-12 (×8): qty 2

## 2017-07-12 MED ORDER — ONDANSETRON HCL 4 MG/2ML IJ SOLN
INTRAMUSCULAR | Status: DC | PRN
  Administered 2017-07-12: 4 mg via INTRAVENOUS

## 2017-07-12 MED ORDER — PHENYLEPHRINE HCL 10 MG/ML IJ SOLN
INTRAVENOUS | Status: DC | PRN
  Administered 2017-07-12: 25 ug/min via INTRAVENOUS

## 2017-07-12 MED ORDER — LEVALBUTEROL HCL 0.63 MG/3ML IN NEBU
0.6300 mg | INHALATION_SOLUTION | Freq: Four times a day (QID) | RESPIRATORY_TRACT | Status: DC
  Administered 2017-07-12 – 2017-07-13 (×2): 0.63 mg via RESPIRATORY_TRACT
  Filled 2017-07-12 (×3): qty 3

## 2017-07-12 MED ORDER — METOCLOPRAMIDE HCL 5 MG/ML IJ SOLN
10.0000 mg | Freq: Four times a day (QID) | INTRAMUSCULAR | Status: AC
  Administered 2017-07-13 (×3): 10 mg via INTRAVENOUS
  Filled 2017-07-12 (×3): qty 2

## 2017-07-12 MED ORDER — 0.9 % SODIUM CHLORIDE (POUR BTL) OPTIME
TOPICAL | Status: DC | PRN
  Administered 2017-07-12: 2000 mL

## 2017-07-12 MED ORDER — LIDOCAINE HCL (CARDIAC) 20 MG/ML IV SOLN
INTRAVENOUS | Status: AC
  Filled 2017-07-12: qty 10

## 2017-07-12 MED ORDER — ACETAMINOPHEN 500 MG PO TABS
1000.0000 mg | ORAL_TABLET | Freq: Four times a day (QID) | ORAL | Status: AC
  Administered 2017-07-13 – 2017-07-17 (×14): 1000 mg via ORAL
  Filled 2017-07-12 (×14): qty 2

## 2017-07-12 MED ORDER — LABETALOL HCL 5 MG/ML IV SOLN
5.0000 mg | Freq: Once | INTRAVENOUS | Status: AC
  Administered 2017-07-12: 5 mg via INTRAVENOUS

## 2017-07-12 MED ORDER — LACTATED RINGERS IV SOLN
INTRAVENOUS | Status: DC
  Administered 2017-07-12: 10:00:00 via INTRAVENOUS

## 2017-07-12 MED ORDER — DEXAMETHASONE SODIUM PHOSPHATE 10 MG/ML IJ SOLN
INTRAMUSCULAR | Status: DC | PRN
  Administered 2017-07-12: 10 mg via INTRAVENOUS

## 2017-07-12 MED ORDER — LACTATED RINGERS IV SOLN
INTRAVENOUS | Status: DC | PRN
  Administered 2017-07-12: 10:00:00 via INTRAVENOUS

## 2017-07-12 MED ORDER — PHENYLEPHRINE 40 MCG/ML (10ML) SYRINGE FOR IV PUSH (FOR BLOOD PRESSURE SUPPORT)
PREFILLED_SYRINGE | INTRAVENOUS | Status: AC
  Filled 2017-07-12: qty 10

## 2017-07-12 MED ORDER — FENTANYL CITRATE (PF) 250 MCG/5ML IJ SOLN
INTRAMUSCULAR | Status: AC
  Filled 2017-07-12: qty 5

## 2017-07-12 MED ORDER — ACETAMINOPHEN 160 MG/5ML PO SOLN: 1000.0000 mg | Freq: Four times a day (QID) | ORAL | Status: AC

## 2017-07-12 MED ORDER — NALOXONE HCL 0.4 MG/ML IJ SOLN: 0.4000 mg | INTRAMUSCULAR | Status: DC | PRN

## 2017-07-12 MED ORDER — FENTANYL CITRATE (PF) 250 MCG/5ML IJ SOLN
INTRAMUSCULAR | Status: DC | PRN
  Administered 2017-07-12: 100 ug via INTRAVENOUS
  Administered 2017-07-12 (×2): 50 ug via INTRAVENOUS
  Administered 2017-07-12: 100 ug via INTRAVENOUS
  Administered 2017-07-12: 50 ug via INTRAVENOUS

## 2017-07-12 MED ORDER — MIDAZOLAM HCL 2 MG/2ML IJ SOLN
INTRAMUSCULAR | Status: AC
  Filled 2017-07-12: qty 2

## 2017-07-12 MED ORDER — SODIUM CHLORIDE 0.9 % IV SOLN
INTRAVENOUS | Status: DC | PRN
  Administered 2017-07-12: 11:00:00 via INTRAVENOUS

## 2017-07-12 MED ORDER — SUGAMMADEX SODIUM 200 MG/2ML IV SOLN
INTRAVENOUS | Status: DC | PRN
  Administered 2017-07-12: 200 mg via INTRAVENOUS

## 2017-07-12 MED ORDER — ROCURONIUM BROMIDE 10 MG/ML (PF) SYRINGE
PREFILLED_SYRINGE | INTRAVENOUS | Status: AC
  Filled 2017-07-12: qty 10

## 2017-07-12 MED ORDER — PHENYLEPHRINE HCL 10 MG/ML IJ SOLN
INTRAMUSCULAR | Status: AC
  Filled 2017-07-12: qty 1

## 2017-07-12 MED ORDER — TRAMADOL HCL 50 MG PO TABS
50.0000 mg | ORAL_TABLET | Freq: Four times a day (QID) | ORAL | Status: DC | PRN
  Administered 2017-07-16 – 2017-07-18 (×3): 50 mg via ORAL
  Filled 2017-07-12 (×3): qty 1

## 2017-07-12 MED ORDER — PHENYLEPHRINE 40 MCG/ML (10ML) SYRINGE FOR IV PUSH (FOR BLOOD PRESSURE SUPPORT)
PREFILLED_SYRINGE | INTRAVENOUS | Status: DC | PRN
  Administered 2017-07-12: 80 ug via INTRAVENOUS

## 2017-07-12 MED ORDER — PROMETHAZINE HCL 25 MG/ML IJ SOLN: 6.2500 mg | INTRAMUSCULAR | Status: DC | PRN

## 2017-07-12 MED ORDER — ONDANSETRON HCL 4 MG/2ML IJ SOLN
INTRAMUSCULAR | Status: AC
  Filled 2017-07-12: qty 2

## 2017-07-12 MED ORDER — POTASSIUM CHLORIDE 10 MEQ/50ML IV SOLN: 10.0000 meq | Freq: Every day | INTRAVENOUS | Status: DC | PRN

## 2017-07-12 MED ORDER — BISACODYL 5 MG PO TBEC
10.0000 mg | DELAYED_RELEASE_TABLET | Freq: Every day | ORAL | Status: DC
  Administered 2017-07-12 – 2017-07-16 (×5): 10 mg via ORAL
  Filled 2017-07-12 (×5): qty 2

## 2017-07-12 MED ORDER — FENTANYL 40 MCG/ML IV SOLN
INTRAVENOUS | Status: DC
  Administered 2017-07-12: 210 ug via INTRAVENOUS
  Administered 2017-07-12 – 2017-07-13 (×2): 1000 ug via INTRAVENOUS
  Administered 2017-07-13: 20 ug via INTRAVENOUS
  Administered 2017-07-13: 160 ug via INTRAVENOUS
  Administered 2017-07-13: 60 ug via INTRAVENOUS
  Administered 2017-07-13: 120 ug via INTRAVENOUS
  Administered 2017-07-13: 40 ug via INTRAVENOUS
  Administered 2017-07-13: 200 ug via INTRAVENOUS
  Administered 2017-07-14: 50 ug via INTRAVENOUS
  Administered 2017-07-14: 10 ug via INTRAVENOUS
  Administered 2017-07-14 (×2): 70 ug via INTRAVENOUS
  Administered 2017-07-14: 100 ug via INTRAVENOUS
  Administered 2017-07-14: 60 ug via INTRAVENOUS
  Administered 2017-07-15: 40 ug via INTRAVENOUS
  Administered 2017-07-15: 70 ug via INTRAVENOUS
  Administered 2017-07-15: 40 ug via INTRAVENOUS
  Administered 2017-07-15: 60 ug via INTRAVENOUS
  Administered 2017-07-16: 20 ug via INTRAVENOUS
  Administered 2017-07-16: 50 ug via INTRAVENOUS
  Filled 2017-07-12 (×2): qty 25

## 2017-07-12 MED ORDER — LIDOCAINE 2% (20 MG/ML) 5 ML SYRINGE
INTRAMUSCULAR | Status: DC | PRN
  Administered 2017-07-12: 60 mg via INTRAVENOUS

## 2017-07-12 MED ORDER — MORPHINE SULFATE (PF) 2 MG/ML IV SOLN
2.0000 mg | Freq: Once | INTRAVENOUS | Status: AC
  Administered 2017-07-12: 2 mg via INTRAVENOUS
  Filled 2017-07-12: qty 1

## 2017-07-12 MED ORDER — PROPOFOL 10 MG/ML IV BOLUS
INTRAVENOUS | Status: DC | PRN
  Administered 2017-07-12: 150 mg via INTRAVENOUS

## 2017-07-12 MED ORDER — DIPHENHYDRAMINE HCL 50 MG/ML IJ SOLN: 12.5000 mg | Freq: Four times a day (QID) | INTRAMUSCULAR | Status: DC | PRN

## 2017-07-12 MED ORDER — PROPOFOL 10 MG/ML IV BOLUS
INTRAVENOUS | Status: AC
  Filled 2017-07-12: qty 20

## 2017-07-12 MED ORDER — LABETALOL HCL 5 MG/ML IV SOLN
INTRAVENOUS | Status: AC
  Filled 2017-07-12: qty 4

## 2017-07-12 MED ORDER — SODIUM CHLORIDE 0.9% FLUSH: 9.0000 mL | INTRAVENOUS | Status: DC | PRN

## 2017-07-12 MED ORDER — DEXAMETHASONE SODIUM PHOSPHATE 10 MG/ML IJ SOLN
INTRAMUSCULAR | Status: AC
  Filled 2017-07-12: qty 1

## 2017-07-12 MED ORDER — HYDROMORPHONE HCL 1 MG/ML IJ SOLN
INTRAMUSCULAR | Status: AC
  Filled 2017-07-12: qty 1

## 2017-07-12 MED ORDER — DIPHENHYDRAMINE HCL 12.5 MG/5ML PO ELIX
12.5000 mg | ORAL_SOLUTION | Freq: Four times a day (QID) | ORAL | Status: DC | PRN
  Filled 2017-07-12: qty 5

## 2017-07-12 SURGICAL SUPPLY — 86 items
ADH SKN CLS APL DERMABOND .7 (GAUZE/BANDAGES/DRESSINGS)
ADH SKN CLS LQ APL DERMABOND (GAUZE/BANDAGES/DRESSINGS) ×2
BAG SPEC RTRVL LRG 6X4 10 (ENDOMECHANICALS)
CANISTER SUCT 3000ML PPV (MISCELLANEOUS) ×8 IMPLANT
CATH KIT ON Q 5IN SLV (PAIN MANAGEMENT) IMPLANT
CATH THORACIC 28FR (CATHETERS) IMPLANT
CATH THORACIC 36FR (CATHETERS) IMPLANT
CATH THORACIC 36FR RT ANG (CATHETERS) IMPLANT
CLIP VESOCCLUDE MED 6/CT (CLIP) ×4 IMPLANT
CONN ST 1/4X3/8  BEN (MISCELLANEOUS) ×4
CONN ST 1/4X3/8 BEN (MISCELLANEOUS) IMPLANT
CONN Y 3/8X3/8X3/8  BEN (MISCELLANEOUS)
CONN Y 3/8X3/8X3/8 BEN (MISCELLANEOUS) IMPLANT
CONT SPEC 4OZ CLIKSEAL STRL BL (MISCELLANEOUS) ×10 IMPLANT
DERMABOND ADHESIVE PROPEN (GAUZE/BANDAGES/DRESSINGS) ×2
DERMABOND ADVANCED (GAUZE/BANDAGES/DRESSINGS)
DERMABOND ADVANCED .7 DNX12 (GAUZE/BANDAGES/DRESSINGS) IMPLANT
DERMABOND ADVANCED .7 DNX6 (GAUZE/BANDAGES/DRESSINGS) IMPLANT
DRAIN CHANNEL 28F RND 3/8 FF (WOUND CARE) ×4 IMPLANT
DRAIN CHANNEL 32F RND 10.7 FF (WOUND CARE) IMPLANT
DRAPE HALF SHEET 40X57 (DRAPES) ×2 IMPLANT
DRAPE LAPAROSCOPIC ABDOMINAL (DRAPES) ×4 IMPLANT
DRAPE WARM FLUID 44X44 (DRAPE) ×8 IMPLANT
ELECT BLADE 6.5 EXT (BLADE) ×4 IMPLANT
ELECT REM PT RETURN 9FT ADLT (ELECTROSURGICAL) ×4
ELECTRODE REM PT RTRN 9FT ADLT (ELECTROSURGICAL) ×2 IMPLANT
GAUZE SPONGE 4X4 12PLY STRL (GAUZE/BANDAGES/DRESSINGS) ×6 IMPLANT
GAUZE SPONGE 4X4 12PLY STRL LF (GAUZE/BANDAGES/DRESSINGS) ×2 IMPLANT
GLOVE BIOGEL PI IND STRL 6.5 (GLOVE) IMPLANT
GLOVE BIOGEL PI INDICATOR 6.5 (GLOVE) ×4
GLOVE SURG SIGNA 7.5 PF LTX (GLOVE) ×8 IMPLANT
GLOVE SURG SS PI 6.5 STRL IVOR (GLOVE) ×2 IMPLANT
GOWN STRL REUS W/ TWL LRG LVL3 (GOWN DISPOSABLE) ×4 IMPLANT
GOWN STRL REUS W/ TWL XL LVL3 (GOWN DISPOSABLE) ×4 IMPLANT
GOWN STRL REUS W/TWL LRG LVL3 (GOWN DISPOSABLE) ×8
GOWN STRL REUS W/TWL XL LVL3 (GOWN DISPOSABLE) ×8
KIT BASIN OR (CUSTOM PROCEDURE TRAY) ×4 IMPLANT
KIT ROOM TURNOVER OR (KITS) ×4 IMPLANT
KIT SUCTION CATH 14FR (SUCTIONS) ×4 IMPLANT
MARKER SKIN DUAL TIP RULER LAB (MISCELLANEOUS) ×2 IMPLANT
NS IRRIG 1000ML POUR BTL (IV SOLUTION) ×12 IMPLANT
OIL SILICONE PENTAX (PARTS (SERVICE/REPAIRS)) ×2 IMPLANT
PACK CHEST (CUSTOM PROCEDURE TRAY) ×4 IMPLANT
PAD ARMBOARD 7.5X6 YLW CONV (MISCELLANEOUS) ×8 IMPLANT
POUCH ENDO CATCH II 15MM (MISCELLANEOUS) IMPLANT
POUCH SPECIMEN RETRIEVAL 10MM (ENDOMECHANICALS) IMPLANT
SEALANT PROGEL (MISCELLANEOUS) IMPLANT
SEALANT SURG COSEAL 4ML (VASCULAR PRODUCTS) IMPLANT
SEALANT SURG COSEAL 8ML (VASCULAR PRODUCTS) IMPLANT
SOLUTION ANTI FOG 6CC (MISCELLANEOUS) ×8 IMPLANT
SPECIMEN JAR MEDIUM (MISCELLANEOUS) ×4 IMPLANT
SPONGE INTESTINAL PEANUT (DISPOSABLE) ×10 IMPLANT
SPONGE TONSIL 1 RF SGL (DISPOSABLE) ×4 IMPLANT
SUT PROLENE 4 0 RB 1 (SUTURE)
SUT PROLENE 4-0 RB1 .5 CRCL 36 (SUTURE) IMPLANT
SUT SILK  1 MH (SUTURE) ×4
SUT SILK 1 MH (SUTURE) ×2 IMPLANT
SUT SILK 1 TIES 10X30 (SUTURE) ×4 IMPLANT
SUT SILK 2 0SH CR/8 30 (SUTURE) IMPLANT
SUT SILK 3 0SH CR/8 30 (SUTURE) IMPLANT
SUT VIC AB 0 CTX 27 (SUTURE) IMPLANT
SUT VIC AB 1 CTX 27 (SUTURE) IMPLANT
SUT VIC AB 2-0 CT1 27 (SUTURE)
SUT VIC AB 2-0 CT1 TAPERPNT 27 (SUTURE) IMPLANT
SUT VIC AB 2-0 CTX 36 (SUTURE) IMPLANT
SUT VIC AB 3-0 MH 27 (SUTURE) IMPLANT
SUT VIC AB 3-0 SH 27 (SUTURE)
SUT VIC AB 3-0 SH 27X BRD (SUTURE) IMPLANT
SUT VIC AB 3-0 X1 27 (SUTURE) ×8 IMPLANT
SUT VICRYL 0 UR6 27IN ABS (SUTURE) ×8 IMPLANT
SUT VICRYL 2 TP 1 (SUTURE) IMPLANT
SWAB COLLECTION DEVICE MRSA (MISCELLANEOUS) IMPLANT
SWAB CULTURE ESWAB REG 1ML (MISCELLANEOUS) IMPLANT
SYR 10ML LL (SYRINGE) IMPLANT
SYR 20ML ECCENTRIC (SYRINGE) ×2 IMPLANT
SYSTEM SAHARA CHEST DRAIN ATS (WOUND CARE) ×4 IMPLANT
TAPE CLOTH SURG 4X10 WHT LF (GAUZE/BANDAGES/DRESSINGS) ×2 IMPLANT
TIP APPLICATOR SPRAY EXTEND 16 (VASCULAR PRODUCTS) IMPLANT
TOWEL GREEN STERILE (TOWEL DISPOSABLE) ×4 IMPLANT
TOWEL GREEN STERILE FF (TOWEL DISPOSABLE) ×4 IMPLANT
TRAP SPECIMEN MUCOUS 40CC (MISCELLANEOUS) IMPLANT
TRAY FOLEY W/METER SILVER 16FR (SET/KITS/TRAYS/PACK) ×4 IMPLANT
TROCAR XCEL BLADELESS 5X75MML (TROCAR) ×4 IMPLANT
TUNNELER SHEATH ON-Q 11GX8 DSP (PAIN MANAGEMENT) IMPLANT
VALVE DISPOSABLE (MISCELLANEOUS) ×2 IMPLANT
WATER STERILE IRR 1000ML POUR (IV SOLUTION) ×4 IMPLANT

## 2017-07-12 NOTE — Anesthesia Procedure Notes (Signed)
Procedure Name: Intubation Date/Time: 07/12/2017 10:19 AM Performed by: Valda Favia, CRNA Pre-anesthesia Checklist: Patient identified, Emergency Drugs available, Suction available and Patient being monitored Patient Re-evaluated:Patient Re-evaluated prior to induction Oxygen Delivery Method: Circle System Utilized Preoxygenation: Pre-oxygenation with 100% oxygen Induction Type: IV induction Ventilation: Mask ventilation without difficulty Laryngoscope Size: Mac and 4 Grade View: Grade I Tube type: Oral Tube size: 8.0 mm Number of attempts: 1 Airway Equipment and Method: Stylet and Oral airway Placement Confirmation: ETT inserted through vocal cords under direct vision,  positive ETCO2 and breath sounds checked- equal and bilateral Tube secured with: Tape Dental Injury: Teeth and Oropharynx as per pre-operative assessment

## 2017-07-12 NOTE — Progress Notes (Signed)
PROGRESS NOTE    Erica Ball  PPI:[id number] DOB: 08/29/1950 DOA: 07/10/2017 PCP: Patient, No Pcp Per   Brief Narrative:  HPI On 07/10/2017 by Dr. Lorretta Harp Erica Ball is a 67 y.o. female with medical history significant of tobacco and alcohol abuse, hypertension, who presents with shortness breath, cough, chest pain and generalized weakness.  Patient states that she has been having dry cough and SOB in the past 4 days, which has been progressively getting worse. She has right sided chest pain, which is constant, sharp, 10 out of 10 in severity, radiating to the right flank area . It is pleuritic, aggravated by deep breath. No hemoptysis. No tenderness in calf areas. Patient does not have fever or chills. Patient denies nausea, vomiting, diarrhea, abdominal pain, symptoms of UTI or unilateral weakness. Pt was seen by PCP today. This is her first visit with this provider. CXR was taken and she was called back by PCP, instructed to come to ED due to "fluid in lung". Patient reports smoking a pack a day for 48years and drinks vodka every daily. Pt can barely speak in full sentence.  Assessment & Plan   Acute respiratory failure with hypoxia/right pleural effusion-Empyema  -CTA was negative for pulmonary embolism however showed large right-sided pleural effusion, right lower lobe bronchus obstruction and possible lobar pneumonia -Continue supplemental oxygen to maintain saturations above 92% -Influenza PCR negative -Interventional radiology consulted and appreciated for ultrasound-guided thoracentesis-which yielded 320 mL of yellow, cloudy fluid -Pulmonology consulted for possible bronchoscopy given loculations noted on CT scan as well as possible bronchus obstruction -Pleural fluid LDH 1193, WBC 10K, Glucose <20 --> consistent with empyema -Cardiothoracic surgery consulted and appreciated, recommending bronchoscopy, right VATS, drainage of empyema and decortication- likely to occur  today -Urine strep pneumonia antigen negative -Continue nebulizer treatments and guaifenesin -Patient initially placed on azithromycin and ceftriaxone however transitioned to Zosyn and azithromycin -pleural fluid culture pending  Sepsis secondary to UTI/pneumonia/Empyema -Patient presented with leukocytosis, tachycardia and tachypnea -UA: Many bacteria, TNTC WBC, moderate leukocytes -Possible pneumonia noted on CTA -Continue azithromycin and ceftriaxone -Blood cultures no growth to date -Urine culture multiple species  Acute kidney injury -Resolved, Suspect secondary to dehydration and sepsis -Upon admission, Creatinine 1.09, baseline truly unknown, as last creatinine was 0.5 in 2013 -Creatinine currently 0.54 -Continue to monitor BMP  Elevated troponin -Patient presented with pleuritic type chest pain, however does have pleural effusion and likely pneumonia/empyema -Suspect troponin due to demand ischemia and sepsis -Cardiology was contacted by the ED and spoke with Dr. Harlow Mares.  Recommended to continue cycling troponin, did not feel this is ACS related. -Plan currently cycle, improving down to 0.12 from 0.55 -Echocardiogram EF 60-65%, grade 1 diastolic dysfunction, aortic valve sclerosis -Continue statin, aspirin added -LDL 38  Essential hypertension -Given sepsis, will hold amlodipine -Continue to monitor closely, hydralazine as needed  Tobacco/alcohol abuse -Discussed cessation -Continue CIWA protocol and nicotine patch  DVT Prophylaxis  SCDs  Code Status: Full  Family Communication: None at bedside  Disposition Plan: Admitted. Pending surgery today. Dispo TBD  Consultants Interventional radiology PCCM Cardiothoracic surgery  Procedures  Right US guided thoracentesis  Echocardiogram  Antibiotics   Anti-infectives (From admission, onward)   Start     Dose/Rate Route Frequency Ordered Stop   07/12/17 0800  vancomycin (VANCOCIN) IVPB 1000 mg/200 mL premix      1,000 mg 200 mL/hr over 60 Minutes Intravenous To Surgery 07/11/17 1944 07/12/17 1050   07/11/17 2000  [MAR Hold]  azithromycin (ZITHROMAX) 500 mg in sodium chloride 0.9 % 250 mL IVPB     (MAR Hold since 07/12/17 0917)   500 mg 250 mL/hr over 60 Minutes Intravenous Every 24 hours 07/10/17 2054 07/18/17 1959   07/11/17 1800  cefTRIAXone (ROCEPHIN) 1 g in sodium chloride 0.9 % 100 mL IVPB  Status:  Discontinued     1 g 200 mL/hr over 30 Minutes Intravenous Every 24 hours 07/10/17 2054 07/11/17 1512   07/11/17 1600  [MAR Hold]  piperacillin-tazobactam (ZOSYN) IVPB 3.375 g     (MAR Hold since 07/12/17 0917)   3.375 g 12.5 mL/hr over 240 Minutes Intravenous Every 8 hours 07/11/17 1550     07/10/17 1815  cefTRIAXone (ROCEPHIN) 1 g in sodium chloride 0.9 % 100 mL IVPB     1 g 200 mL/hr over 30 Minutes Intravenous  Once 07/10/17 1812 07/10/17 1912   07/10/17 1815  azithromycin (ZITHROMAX) 500 mg in sodium chloride 0.9 % 250 mL IVPB     500 mg 250 mL/hr over 60 Minutes Intravenous  Once 07/10/17 1812 07/10/17 2209      Subjective:   Erica Ball seen and examined today.  Continue to complain of right-sided pain.  Unable to take a deep breath in.  Currently denies chest pain, abdominal pain, nausea vomiting, diarrhea or constipation.  Complains of dry mouth.  Objective:   Vitals:   07/11/17 1728 07/11/17 1926 07/11/17 1948 07/12/17 0500  BP: 128/68  (!) 143/68 (!) 158/82  Pulse: 89 (!) 105 (!) 102 (!) 115  Resp: 18 18 18  (!) 22  Temp: 98 F (36.7 C)  98.2 F (36.8 C) 99.1 F (37.3 C)  TempSrc: Oral  Oral Oral  SpO2: 93% 94% 95% 94%  Weight:    71.8 kg (158 lb 3.2 oz)  Height:        Intake/Output Summary (Last 24 hours) at 07/12/2017 1205 Last data filed at 07/12/2017 9604 Gross per 24 hour  Intake 2205 ml  Output 1920 ml  Net 285 ml   Filed Weights   07/10/17 2345 07/11/17 0600 07/12/17 0500  Weight: 65.9 kg (145 lb 4.8 oz) 70.5 kg (155 lb 8 oz) 71.8 kg (158 lb 3.2 oz)    Exam  General: Well developed, well nourished, NAD, appears stated age  HEENT: NCAT, mucous membranes moist  Neck: Supple  Cardiovascular: S1 S2 auscultated, RRR, no murmur  Respiratory: Diminished breath sounds on the right  Abdomen: Soft, nontender, nondistended, + bowel sounds  Extremities: warm dry without cyanosis clubbing or edema  Neuro: AAOx3, nonfocal  Psych: Anxious   Data Reviewed: I have personally reviewed following labs and imaging studies  CBC: Recent Labs  Lab 07/10/17 1625 07/11/17 2120 07/12/17 0637  WBC 21.3* 13.0* 11.1*  NEUTROABS 19.4*  --   --   HGB 15.8* 14.1 12.1  HCT 45.3 42.0 36.6  MCV 103.4* 104.5* 104.6*  PLT 314 344 320   Basic Metabolic Panel: Recent Labs  Lab 07/10/17 1625 07/11/17 2120 07/12/17 0637  NA 130* 132* 131*  K 3.8 3.6 3.3*  CL 90* 95* 95*  CO2 26 26 25   GLUCOSE 122* 118* 84  BUN 19 7 <5*  CREATININE 1.09* 0.67 0.54  CALCIUM 9.0 8.0* 7.5*   GFR: Estimated Creatinine Clearance: 68.3 mL/min (by C-G formula based on SCr of 0.54 mg/dL). Liver Function Tests: Recent Labs  Lab 07/10/17 1625 07/11/17 2120  AST 12* 19  ALT 7* 11*  ALKPHOS 78 116  BILITOT  0.7 0.7  PROT 6.7 5.4*  ALBUMIN 3.0* 2.5*   No results for input(s): LIPASE, AMYLASE in the last 168 hours. No results for input(s): AMMONIA in the last 168 hours. Coagulation Profile: Recent Labs  Lab 07/10/17 2110 07/11/17 2120  INR 1.07 1.13   Cardiac Enzymes: Recent Labs  Lab 07/10/17 1736 07/10/17 2005 07/11/17 0704 07/11/17 1406  TROPONINI 0.49* 0.55* 0.12* <0.03   BNP (last 3 results) No results for input(s): PROBNP in the last 8760 hours. HbA1C: Recent Labs    07/11/17 0304  HGBA1C 5.0   CBG: No results for input(s): GLUCAP in the last 168 hours. Lipid Profile: Recent Labs    07/11/17 0304  CHOL 88  HDL 41  LDLCALC 38  TRIG 47  CHOLHDL 2.1   Thyroid Function Tests: Recent Labs    07/11/17 0304  TSH 2.922   Anemia  Panel: No results for input(s): VITAMINB12, FOLATE, FERRITIN, TIBC, IRON, RETICCTPCT in the last 72 hours. Urine analysis:    Component Value Date/Time   COLORURINE YELLOW 07/11/2017 2148   APPEARANCEUR HAZY (A) 07/11/2017 2148   LABSPEC 1.006 07/11/2017 2148   PHURINE 6.0 07/11/2017 2148   GLUCOSEU NEGATIVE 07/11/2017 2148   HGBUR MODERATE (A) 07/11/2017 2148   BILIRUBINUR NEGATIVE 07/11/2017 2148   BILIRUBINUR neg 10/11/2011 1425   KETONESUR 5 (A) 07/11/2017 2148   PROTEINUR NEGATIVE 07/11/2017 2148   UROBILINOGEN 0.2 10/11/2011 1425   UROBILINOGEN 0.2 01/24/2008 2217   NITRITE NEGATIVE 07/11/2017 2148   LEUKOCYTESUR LARGE (A) 07/11/2017 2148   Sepsis Labs: @LABRCNTIP (procalcitonin:4,lacticidven:4)  ) Recent Results (from the past 240 hour(s))  Blood Culture (routine x 2)     Status: None (Preliminary result)   Collection Time: 07/10/17  4:34 PM  Result Value Ref Range Status   Specimen Description BLOOD RIGHT ANTECUBITAL  Final   Special Requests   Final    BOTTLES DRAWN AEROBIC AND ANAEROBIC Blood Culture adequate volume   Culture   Final    NO GROWTH < 24 HOURS Performed at [facility], [address] [place], [place] [address]    Report Status PENDING  Incomplete  Blood Culture (routine x 2)     Status: None (Preliminary result)   Collection Time: 07/10/17  4:34 PM  Result Value Ref Range Status   Specimen Description BLOOD RIGHT ANTECUBITAL  Final   Special Requests IN PEDIATRIC BOTTLE Blood Culture adequate volume  Final   Culture   Final    NO GROWTH < 24 HOURS Performed at [facility], [address] [place], [place] [address]    Report Status PENDING  Incomplete  Urine culture     Status: Abnormal   Collection Time: 07/10/17  6:39 PM  Result Value Ref Range Status   Specimen Description URINE, RANDOM  Final   Special Requests   Final    NONE Performed at [facility], [address] [place], [place] [address]    Culture MULTIPLE  SPECIES PRESENT, SUGGEST RECOLLECTION (A)  Final   Report Status 07/12/2017 FINAL  Final  Culture, sputum-assessment     Status: None   Collection Time: 07/11/17 11:54 AM  Result Value Ref Range Status   Specimen Description EXPECTORATED SPUTUM  Final   Special Requests NONE  Final   Sputum evaluation   Final    Sputum specimen not acceptable for testing.  Please recollect.   Gram Stain Report Called to,Read Back By and Verified With: RN J  Andria Meuse [id number] MLM Performed at [facility], [address] [place], [place] [address]    Report Status 07/11/2017 FINAL  Final  Gram stain     Status: None   Collection Time: 07/11/17  1:28 PM  Result Value Ref Range Status   Specimen Description PLEURAL  Final   Special Requests RIGHT  Final   Gram Stain   Final    ABUNDANT WBC PRESENT, PREDOMINANTLY PMN NO ORGANISMS SEEN Performed at [facility], [address], [place], [place] [address]    Report Status 07/11/2017 FINAL  Final  Surgical PCR screen     Status: None   Collection Time: 07/12/17  8:06 AM  Result Value Ref Range Status   MRSA, PCR NEGATIVE NEGATIVE Final   Staphylococcus aureus NEGATIVE NEGATIVE Final    Comment: (NOTE) The Xpert SA Assay (FDA approved for NASAL specimens in patients 71 years of age and older), is one component of a comprehensive surveillance program. It is not intended to diagnose infection nor to guide or monitor treatment. Performed at [facility], [address], [place], [place] [address]       Radiology Studies: Dg Chest 1 View  Result Date: 07/11/2017 CLINICAL DATA:  Patient status post right thoracentesis. EXAM: CHEST 1 VIEW COMPARISON:  Chest radiograph 07/10/2017 FINDINGS: Monitoring leads overlie the patient. Stable cardiac and mediastinal contours. Persistent moderate right pleural effusion. Underlying consolidation right lower lung. Left basilar atelectasis. No pneumothorax. IMPRESSION: Redemonstrated moderate right  pleural effusion with underlying consolidation. Electronically Signed   By: Annia Belt M.D.   On: 07/11/2017 13:39   Ct Angio Chest Pe W/cm &/or Wo Cm  Result Date: 07/10/2017 CLINICAL DATA:  67 year old female with right-sided chest pain, suspect pulmonary embolus EXAM: CT ANGIOGRAPHY CHEST WITH CONTRAST TECHNIQUE: Multidetector CT imaging of the chest was performed using the standard protocol during bolus administration of intravenous contrast. Multiplanar CT image reconstructions and MIPs were obtained to evaluate the vascular anatomy. CONTRAST:  100 mL Isovue 370 COMPARISON:  Chest x-ray obtained earlier today; prior CT scan of the chest 10/01/2004 FINDINGS: Cardiovascular: Satisfactory opacification of the pulmonary arteries to the segmental level. No evidence of pulmonary embolism. Normal heart size. No pericardial effusion. The main pulmonary artery is enlarged at 3.3 cm. Trace aortic atherosclerotic calcifications. Calcifications are present throughout the coronary arteries. The heart is normal in size. No pericardial effusion. Mediastinum/Nodes: Unremarkable CT appearance of the thyroid gland. No suspicious mediastinal or hilar adenopathy. No soft tissue mediastinal mass. The thoracic esophagus is unremarkable. Lungs/Pleura: Large right-sided pleural effusion with associated atelectasis of the entire right lower lobe and a portion of the inferior right middle lobe. There is a smaller loculated pleural effusion at the right lung apex. Complete occlusion of the right lower lobe bronchus with what appears to be secretions. No definite enhancing mass. Mild elevation of the left hemidiaphragm. Subsegmental left lower lobe atelectasis. Upper Abdomen: No acute abnormality. Musculoskeletal: No chest wall abnormality. No acute or significant osseous findings. Review of the MIP images confirms the above findings. IMPRESSION: 1. Large right-sided pleural effusion with complete atelectasis of the right lower lobe  and partial atelectasis of the inferior right middle lobe. Of note, the right lower lobe bronchus is completely obstructed by what appears to be secretions/mucus plugging. An endobronchial lesion is considered less likely but not excluded. The pleural effusion may be secondary to chronic atelectasis from this obstructed bronchus rather than the primary pathology. Recommend referral  to pulmonology for bronchoscopy. 2. Smaller loculated pleural effusion at the posterior aspect of the right apex. 3. Enlarged main pulmonary artery as can be seen in the setting of pulmonary arterial hypertension. 4.  Aortic Atherosclerosis (ICD10-170.0). 5. Coronary artery calcifications. Electronically Signed   By: Malachy MoanHeath  McCullough M.D.   On: 07/10/2017 19:41   Dg Chest Port 1 View  Result Date: 07/10/2017 CLINICAL DATA:  67 year old female with right chest and back pain EXAM: PORTABLE CHEST 1 VIEW COMPARISON:  Prior chest x-ray 10/30/2011 FINDINGS: Dense right basilar opacity with complete obscure a shin of the right hemidiaphragm. Veil like opacity in the right mid lung. The left lung appears clear. The cardiac mediastinal contours are grossly within normal limits. Atherosclerotic calcification is present in the transverse aorta. No pneumothorax. IMPRESSION: Dense right basilar opacity likely represents a combination of pleural fluid with either atelectasis or infiltrate/pneumonia. An underlying pulmonary nodule or mass is difficult to exclude radiographically. Aortic Atherosclerosis (ICD10-170.0) Electronically Signed   By: Malachy MoanHeath  McCullough M.D.   On: 07/10/2017 18:07   Ir Thoracentesis Asp Pleural Space W/img Guide  Result Date: 07/11/2017 INDICATION: Patient with right pleural effusion. Request is made for diagnostic and therapeutic thoracentesis. EXAM: ULTRASOUND GUIDED DIAGNOSTIC AND THERAPEUTIC RIGHT THORACENTESIS MEDICATIONS: 10 mL 1% lidocaine COMPLICATIONS: None immediate. PROCEDURE: An ultrasound guided thoracentesis  was thoroughly discussed with the patient and questions answered. The benefits, risks, alternatives and complications were also discussed. The patient understands and wishes to proceed with the procedure. Written consent was obtained. Ultrasound was performed to localize and mark an adequate pocket of fluid in the right chest. The area was then prepped and draped in the normal sterile fashion. 1% Lidocaine was used for local anesthesia. Under ultrasound guidance a Safe-T-Centesis catheter was introduced. Thoracentesis was performed. The catheter was removed and a dressing applied. FINDINGS: A total of approximately 320 mL of yellow cloudy fluid was removed. Samples were sent to the laboratory as requested by the clinical team. IMPRESSION: Successful ultrasound guided diagnostic and therapeutic right thoracentesis yielding 320 mL of pleural fluid. Read by: Loyce DysKacie Matthews PA-C Electronically Signed   By: Richarda OverlieAdam  Henn M.D.   On: 07/11/2017 15:47     Scheduled Meds: . [MAR Hold] aspirin  325 mg Oral Daily  . [MAR Hold] atorvastatin  40 mg Oral q1800  . [MAR Hold] folic acid  1 mg Oral Daily  . [MAR Hold] levalbuterol  1.25 mg Nebulization TID  . [MAR Hold] LORazepam  0-4 mg Intravenous Q6H   Followed by  . [MAR Hold] LORazepam  0-4 mg Intravenous Q12H  . [MAR Hold] mouth rinse  15 mL Mouth Rinse BID  . [MAR Hold] multivitamin with minerals  1 tablet Oral Daily  . [MAR Hold] nicotine  21 mg Transdermal Daily  . [MAR Hold] thiamine  100 mg Oral Daily   Or  . [MAR Hold] thiamine  100 mg Intravenous Daily   Continuous Infusions: . sodium chloride 1,000 mL (07/12/17 0706)  . [MAR Hold] azithromycin Stopped (07/11/17 2246)  . lactated ringers    . [MAR Hold] piperacillin-tazobactam (ZOSYN)  IV 0 g (07/12/17 0624)     LOS: 2 days   Time Spent in minutes   45 minutes  Jerren Flinchbaugh D.O. on 07/12/2017 at 12:05 PM  Between 7am to 7pm - Pager - [phone]  After 7pm go to [email or web address] - password  [email or web address]  And look for the night coverage person covering for me after hours  [facility]  Office  [phone]

## 2017-07-12 NOTE — Progress Notes (Signed)
  Echocardiogram 2D Echocardiogram has been performed.  Erica Ball 07/12/2017, 8:53 AM

## 2017-07-12 NOTE — Interval H&P Note (Signed)
History and Physical Interval Note:  07/12/2017 8:47 AM  Erica Ball  has presented today for surgery, with the diagnosis of RIGHT EMPYEMA  The various methods of treatment have been discussed with the patient and family. After consideration of risks, benefits and other options for treatment, the patient has consented to  Procedure(s): VIDEO ASSISTED THORACOSCOPY (VATS)/EMPYEMA (Right) as a surgical intervention .  The patient's history has been reviewed, patient examined, no change in status, stable for surgery.  I have reviewed the patient's chart and labs.  Questions were answered to the patient's satisfaction.     Loreli SlotSteven C Hendrickson

## 2017-07-12 NOTE — Brief Op Note (Addendum)
07/10/2017 - 07/12/2017  12:22 PM  PATIENT:  Erica Ball  67 y.o. female  PRE-OPERATIVE DIAGNOSIS:  RIGHT EMPYEMA  POST-OPERATIVE DIAGNOSIS:  RIGHT EMPYEMA  PROCEDURE:  Procedure(s):  RIGHT VIDEO ASSISTED THORACOSCOPY  -Drainage of Empyema -Decortication of lung and chest wall  VIDEO BRONCHOSCOPY (N/A)  SURGEON:  Surgeon(s) and Role:    * Loreli SlotHendrickson, Antonique Langford C, MD - Primary  PHYSICIAN ASSISTANT: Lowella DandyErin Barrett PA-C  ANESTHESIA:   general  EBL:  300 mL   BLOOD ADMINISTERED:none  DRAINS: 28 Blake Drain Chest Tubes   LOCAL MEDICATIONS USED:  NONE  SPECIMEN:  Source of Specimen:  Pleural Fluid, Pleural Peel  DISPOSITION OF SPECIMEN:  PATHOLOGY  COUNTS:  YES  TOURNIQUET:  * No tourniquets in log *  DICTATION: .Dragon Dictation  PLAN OF CARE: Admit to inpatient   PATIENT DISPOSITION:  PACU - hemodynamically stable.   Delay start of Pharmacological VTE agent (>24hrs) due to surgical blood loss or risk of bleeding: yes

## 2017-07-12 NOTE — Progress Notes (Signed)
Report called to short stay. 

## 2017-07-12 NOTE — Progress Notes (Signed)
All belongings sent with pt son including dentures, jewelry and tablet

## 2017-07-12 NOTE — Transfer of Care (Signed)
Immediate Anesthesia Transfer of Care Note  Patient: Gwynneth AlimentKathleen M Willmon  Procedure(s) Performed: right VIDEO ASSISTED THORACOSCOPY (VATS) for drainage of EMPYEMA and decortication (Right Chest) VIDEO BRONCHOSCOPY (N/A Chest)  Patient Location: PACU  Anesthesia Type:General  Level of Consciousness: drowsy  Airway & Oxygen Therapy: Patient Spontanous Breathing and Patient connected to face mask oxygen  Post-op Assessment: Report given to RN and Post -op Vital signs reviewed and stable  Post vital signs: Reviewed and stable  Last Vitals:  Vitals:   07/12/17 0500 07/12/17 1249  BP: (!) 158/82   Pulse: (!) 115   Resp: (!) 22   Temp: 37.3 C (!) 36.2 C  SpO2: 94%     Last Pain:  Vitals:   07/12/17 0610  TempSrc:   PainSc: 2       Patients Stated Pain Goal: 2 (07/12/17 0510)  Complications: No apparent anesthesia complications

## 2017-07-12 NOTE — Anesthesia Preprocedure Evaluation (Addendum)
Anesthesia Evaluation  Patient identified by MRN, date of birth, ID band Patient awake    Reviewed: Allergy & Precautions, NPO status , Patient's Chart, lab work & pertinent test results  History of Anesthesia Complications Negative for: history of anesthetic complications  Airway Mallampati: I  TM Distance: >3 FB Neck ROM: Full    Dental no notable dental hx. (+) Edentulous Upper, Missing, Dental Advisory Given   Pulmonary neg pulmonary ROS, Current Smoker,    Pulmonary exam normal  + wheezing      Cardiovascular hypertension, Pt. on medications negative cardio ROS Normal cardiovascular exam     Neuro/Psych negative neurological ROS     GI/Hepatic negative GI ROS, (+)     substance abuse  alcohol use,   Endo/Other  negative endocrine ROS  Renal/GU negative Renal ROS     Musculoskeletal negative musculoskeletal ROS (+)   Abdominal   Peds  Hematology negative hematology ROS (+)   Anesthesia Other Findings Day of surgery medications reviewed with the patient.  Reproductive/Obstetrics                            Anesthesia Physical Anesthesia Plan  ASA: III  Anesthesia Plan: General   Post-op Pain Management:    Induction: Intravenous  PONV Risk Score and Plan: 2 and Ondansetron and Dexamethasone  Airway Management Planned: Double Lumen EBT  Additional Equipment: Arterial line and CVP  Intra-op Plan:   Post-operative Plan: Possible Post-op intubation/ventilation  Informed Consent: I have reviewed the patients History and Physical, chart, labs and discussed the procedure including the risks, benefits and alternatives for the proposed anesthesia with the patient or authorized representative who has indicated his/her understanding and acceptance.   Dental advisory given  Plan Discussed with: CRNA, Anesthesiologist and Surgeon  Anesthesia Plan Comments:         Anesthesia Quick Evaluation

## 2017-07-12 NOTE — Anesthesia Procedure Notes (Signed)
Arterial Line Insertion Performed by: Samara DeistBeckner, Lashuna Tamashiro B, CRNA, CRNA  Patient location: Pre-op. Patient sedated Left, radial was placed Catheter size: 20 G Hand hygiene performed , maximum sterile barriers used  and Seldinger technique used Allen's test indicative of satisfactory collateral circulation Attempts: 1 Procedure performed without using ultrasound guided technique. Following insertion, dressing applied and Biopatch. Post procedure assessment: normal  Patient tolerated the procedure well with no immediate complications.

## 2017-07-12 NOTE — Progress Notes (Signed)
MD Mikhail at bedside  Stated to keep pt NPO, no medications  Pt aware

## 2017-07-12 NOTE — Anesthesia Procedure Notes (Signed)
Procedure Name: Intubation Date/Time: 07/12/2017 10:34 AM Performed by: Valda Favia, CRNA Pre-anesthesia Checklist: Patient identified, Emergency Drugs available, Suction available, Patient being monitored and Timeout performed Patient Re-evaluated:Patient Re-evaluated prior to induction Preoxygenation: Pre-oxygenation with 100% oxygen Laryngoscope Size: Mac and 4 Grade View: Grade I Endobronchial tube: Left, Double lumen EBT, EBT position confirmed by auscultation and EBT position confirmed by fiberoptic bronchoscope Placement Confirmation: ETT inserted through vocal cords under direct vision,  positive ETCO2 and breath sounds checked- equal and bilateral Tube secured with: Tape Dental Injury: Teeth and Oropharynx as per pre-operative assessment  Comments: 8.0 oral ETT removed with direct laryngoscopy and exchanged for double lumen ETT with camera.

## 2017-07-12 NOTE — Anesthesia Postprocedure Evaluation (Signed)
Anesthesia Post Note  Patient: Gwynneth AlimentKathleen M Bidinger  Procedure(s) Performed: right VIDEO ASSISTED THORACOSCOPY (VATS) for drainage of EMPYEMA and decortication (Right Chest) VIDEO BRONCHOSCOPY (N/A Chest)     Patient location during evaluation: PACU Anesthesia Type: General Level of consciousness: sedated Pain management: pain level controlled Vital Signs Assessment: post-procedure vital signs reviewed and stable Respiratory status: spontaneous breathing and respiratory function stable Cardiovascular status: stable Postop Assessment: no apparent nausea or vomiting Anesthetic complications: no    Last Vitals:  Vitals:   07/12/17 1415 07/12/17 1417  BP: 128/67   Pulse: 84   Resp: 18 20  Temp:    SpO2: 92% 94%    Last Pain:  Vitals:   07/12/17 1417  TempSrc:   PainSc: Asleep                 Conni Knighton DANIEL

## 2017-07-13 ENCOUNTER — Encounter (HOSPITAL_COMMUNITY): Payer: Self-pay | Admitting: Thoracic Surgery (Cardiothoracic Vascular Surgery)

## 2017-07-13 ENCOUNTER — Inpatient Hospital Stay (HOSPITAL_COMMUNITY): Payer: BC Managed Care – PPO

## 2017-07-13 LAB — BASIC METABOLIC PANEL
ANION GAP: 11 (ref 5–15)
CHLORIDE: 95 mmol/L — AB (ref 101–111)
CO2: 25 mmol/L (ref 22–32)
Calcium: 7.4 mg/dL — ABNORMAL LOW (ref 8.9–10.3)
Creatinine, Ser: 0.48 mg/dL (ref 0.44–1.00)
Glucose, Bld: 201 mg/dL — ABNORMAL HIGH (ref 65–99)
Potassium: 3.5 mmol/L (ref 3.5–5.1)
SODIUM: 131 mmol/L — AB (ref 135–145)

## 2017-07-13 LAB — CBC
HCT: 34.5 % — ABNORMAL LOW (ref 36.0–46.0)
HEMOGLOBIN: 11.6 g/dL — AB (ref 12.0–15.0)
MCH: 34.6 pg — AB (ref 26.0–34.0)
MCHC: 33.6 g/dL (ref 30.0–36.0)
MCV: 103 fL — ABNORMAL HIGH (ref 78.0–100.0)
PLATELETS: 332 10*3/uL (ref 150–400)
RBC: 3.35 MIL/uL — ABNORMAL LOW (ref 3.87–5.11)
RDW: 12.7 % (ref 11.5–15.5)
WBC: 14.3 10*3/uL — AB (ref 4.0–10.5)

## 2017-07-13 LAB — BLOOD GAS, ARTERIAL
ACID-BASE EXCESS: 3.6 mmol/L — AB (ref 0.0–2.0)
Bicarbonate: 28 mmol/L (ref 20.0–28.0)
Drawn by: 28340
O2 CONTENT: 4 L/min
O2 SAT: 96 %
PATIENT TEMPERATURE: 98.6
PCO2 ART: 44.8 mmHg (ref 32.0–48.0)
pH, Arterial: 7.412 (ref 7.350–7.450)
pO2, Arterial: 80.2 mmHg — ABNORMAL LOW (ref 83.0–108.0)

## 2017-07-13 LAB — MRSA PCR SCREENING: MRSA by PCR: NEGATIVE

## 2017-07-13 MED ORDER — METOPROLOL TARTRATE 5 MG/5ML IV SOLN
5.0000 mg | Freq: Once | INTRAVENOUS | Status: AC
  Administered 2017-07-13: 5 mg via INTRAVENOUS

## 2017-07-13 MED ORDER — LEVALBUTEROL HCL 0.63 MG/3ML IN NEBU
0.6300 mg | INHALATION_SOLUTION | Freq: Three times a day (TID) | RESPIRATORY_TRACT | Status: DC
  Administered 2017-07-13 – 2017-07-15 (×8): 0.63 mg via RESPIRATORY_TRACT
  Filled 2017-07-13 (×7): qty 3

## 2017-07-13 MED ORDER — METOPROLOL TARTRATE 5 MG/5ML IV SOLN
INTRAVENOUS | Status: AC
  Filled 2017-07-13: qty 5

## 2017-07-13 NOTE — Progress Notes (Addendum)
      [address]       [place] [id number]             [phone]      1 Day Post-Op Procedure(s) (LRB): right VIDEO ASSISTED THORACOSCOPY (VATS) for drainage of EMPYEMA and decortication (Right) VIDEO BRONCHOSCOPY (N/A) Subjective: Feels okay this morning. Pain is well controlled.   Objective: Vital signs in last 24 hours: Temp:  [97.3 F (36.3 C)-99.5 F (37.5 C)] 97.3 F (36.3 C) (03/09 1111) Pulse Rate:  [70-157] 82 (03/09 1111) Cardiac Rhythm: Normal sinus rhythm (03/09 1111) Resp:  [13-25] 22 (03/09 1111) BP: (113-178)/(57-94) 132/71 (03/09 1111) SpO2:  [90 %-100 %] 94 % (03/09 1111) Arterial Line BP: (129-181)/(57-77) 178/68 (03/08 1957) Weight:  [165 lb 2 oz (74.9 kg)] 165 lb 2 oz (74.9 kg) (03/08 2019)     Intake/Output from previous day: 03/08 0701 - 03/09 0700 In: 5290 [P.O.:480; I.V.:4660; IV Piggyback:150] Out: 3418 [Urine:2300; Blood:300; Chest Tube:318] Intake/Output this shift: Total I/O In: 990 [P.O.:240; I.V.:700; IV Piggyback:50] Out: 100 [Urine:100]  General appearance: alert, cooperative and no distress Heart: regular rate and rhythm, S1, S2 normal, no murmur, click, rub or gallop Lungs: clear to auscultation bilaterally Abdomen: soft, non-tender; bowel sounds normal; no masses,  no organomegaly Extremities: extremities normal, atraumatic, no cyanosis or edema Wound: clean and dry  Lab Results: Recent Labs    07/12/17 0637 07/13/17 0306  WBC 11.1* 14.3*  HGB 12.1 11.6*  HCT 36.6 34.5*  PLT 320 332   BMET:  Recent Labs    07/12/17 0637 07/13/17 0306  NA 131* 131*  K 3.3* 3.5  CL 95* 95*  CO2 25 25  GLUCOSE 84 201*  BUN <5* <5*  CREATININE 0.54 0.48  CALCIUM 7.5* 7.4*    PT/INR:  Recent Labs    07/11/17 2120  LABPROT 14.4  INR 1.13   ABG    Component Value Date/Time   PHART 7.412 07/13/2017 0210   HCO3 28.0 07/13/2017 0210   O2SAT 96.0 07/13/2017 0210   CBG (last 3)  No results for input(s):  GLUCAP in the last 72 hours.  Assessment/Plan: S/P Procedure(s) (LRB): right VIDEO ASSISTED THORACOSCOPY (VATS) for drainage of EMPYEMA and decortication (Right) VIDEO BRONCHOSCOPY (N/A)  1. CV- NSR in the 80s. BP stable 2. Pulm-318cc/24 hours out of chest tubes. Keep tube for now. Tolerating 2.5L [place]. No CXR? Will order one for today and tomorrow.  3. Renal-creatinine 0.48. Potassium 3.5, replace per protocol 4. H and H stable 5. Endo-not a diabetic, A1C 5.0 6. ID- continue Zosyn and Azithromycin. Initial cultures show GPC and GPR. Final results pending.   Plan: Continue chest tubes until drainage is much less. Encourage incentive spirometer. Ambulate in the halls. Medical care per primary. Review CXR once done.   LOS: 3 days    Erica Ball 07/13/2017   Chart reviewed, patient examined, agree with above. CXR shows stable haziness at right base as expected. Keep chest tubes in. Continue antibiotics and follow up on cultures.

## 2017-07-13 NOTE — Progress Notes (Signed)
PROGRESS NOTE    Erica Ball  ZOX:[id number] DOB: [birth year] DOA: 07/10/2017 PCP: Patient, No Pcp Per   Brief Narrative:  HPI On 07/10/2017 by Dr. Lorretta Ball Erica Ball is a 67 y.o. female with medical history significant of tobacco and alcohol abuse, hypertension, who presents with shortness breath, cough, chest pain and generalized weakness.  Patient states that she has been having dry cough and SOB in the past 4 days, which has been progressively getting worse. She has right sided chest pain, which is constant, sharp, 10 out of 10 in severity, radiating to the right flank area . It is pleuritic, aggravated by deep breath. No hemoptysis. No tenderness in calf areas. Patient does not have fever or chills. Patient denies nausea, vomiting, diarrhea, abdominal pain, symptoms of UTI or unilateral weakness. Pt was seen by PCP today. This is her first visit with this provider. CXR was taken and she was called back by PCP, instructed to come to ED due to "fluid in lung". Patient reports smoking a pack a day for 48years and drinks vodka every daily. Pt can barely speak in full sentence.  Interim history   Assessment & Plan   Acute respiratory failure with hypoxia/right pleural effusion-Empyema  -CTA was negative for pulmonary embolism however showed large right-sided pleural effusion, right lower lobe bronchus obstruction and possible lobar pneumonia -Continue supplemental oxygen to maintain saturations above 92% -Influenza PCR negative -Interventional radiology consulted and appreciated for ultrasound-guided thoracentesis-which yielded 320 mL of yellow, cloudy fluid -Pulmonology consulted for possible bronchoscopy given loculations noted on CT scan as well as possible bronchus obstruction -Pleural fluid LDH 1193, WBC 10K, Glucose <20 --> consistent with empyema -Cardiothoracic surgery consulted and appreciated, s/p bronchoscopy, right VATS, drainage of empyema and decortication, chest  tube placement -Urine strep pneumonia antigen negative -Continue nebulizer treatments and guaifenesin -Patient initially placed on azithromycin and ceftriaxone however transitioned to Zosyn and azithromycin -pleural fluid culture pending  Sepsis secondary to UTI/pneumonia/Empyema -Patient presented with leukocytosis, tachycardia and tachypnea -UA: Many bacteria, TNTC WBC, moderate leukocytes -Possible pneumonia noted on CTA -Continue azithromycin and zosyn -Blood cultures no growth to date -Urine culture multiple species  Acute kidney injury -Resolved, Suspect secondary to dehydration and sepsis -Upon admission, Creatinine 1.09, baseline truly unknown, as last creatinine was 0.5 in 2013 -Creatinine currently 0.48 -Continue to monitor BMP  Elevated troponin -Patient presented with pleuritic type chest pain, however does have pleural effusion and likely pneumonia/empyema -Suspect troponin due to demand ischemia and sepsis -Cardiology was contacted by the ED and spoke with Dr. Harlow Mares.  Recommended to continue cycling troponin, did not feel this is ACS related. -Plan currently cycle, improving down 0.55 to <0.03 -Echocardiogram EF 60-65%, grade 1 diastolic dysfunction, aortic valve sclerosis -Continue statin, aspirin added -LDL 38  Essential hypertension -Given sepsis, will hold amlodipine -Continue to monitor closely, hydralazine as needed  Tobacco/alcohol abuse -Discussed cessation -Continue CIWA protocol and nicotine patch  DVT Prophylaxis  SCDs  Code Status: Full  Family Communication: None at bedside  Disposition Plan: Admitted. Pending surgery today. Dispo TBD  Consultants Interventional radiology PCCM Cardiothoracic surgery  Procedures  Right US guided thoracentesis  Echocardiogram Bronchoscopy, right video-assisted thoracic drainage of empyema, visceral and parietal pleural decortication  Antibiotics   Anti-infectives (From admission, onward)   Start      Dose/Rate Route Frequency Ordered Stop   07/12/17 0800  vancomycin (VANCOCIN) IVPB 1000 mg/200 mL premix     1,000 mg 200 mL/hr over 60 Minutes  Intravenous To Surgery 07/11/17 1944 07/12/17 1050   07/11/17 2000  azithromycin (ZITHROMAX) 500 mg in sodium chloride 0.9 % 250 mL IVPB     500 mg 250 mL/hr over 60 Minutes Intravenous Every 24 hours 07/10/17 2054 07/18/17 1959   07/11/17 1800  cefTRIAXone (ROCEPHIN) 1 g in sodium chloride 0.9 % 100 mL IVPB  Status:  Discontinued     1 g 200 mL/hr over 30 Minutes Intravenous Every 24 hours 07/10/17 2054 07/11/17 1512   07/11/17 1600  piperacillin-tazobactam (ZOSYN) IVPB 3.375 g     3.375 g 12.5 mL/hr over 240 Minutes Intravenous Every 8 hours 07/11/17 1550     07/10/17 1815  cefTRIAXone (ROCEPHIN) 1 g in sodium chloride 0.9 % 100 mL IVPB     1 g 200 mL/hr over 30 Minutes Intravenous  Once 07/10/17 1812 07/10/17 1912   07/10/17 1815  azithromycin (ZITHROMAX) 500 mg in sodium chloride 0.9 % 250 mL IVPB     500 mg 250 mL/hr over 60 Minutes Intravenous  Once 07/10/17 1812 07/10/17 2209      Subjective:   Erica Ball seen and examined today.  Patient feeling better this morning feels she is able to breathe more.  Currently denies chest pain, abdominal pain, nausea or vomiting, diarrhea or constipation, dizziness or headache.  Objective:   Vitals:   07/13/17 0339 07/13/17 0400 07/13/17 0744 07/13/17 0846  BP: (!) 116/57  126/63   Pulse: 70  81   Resp: 14 13 18 15   Temp: 98 F (36.7 C)  97.6 F (36.4 C)   TempSrc: Oral  Oral   SpO2: 97% 97% 96% 95%  Weight:      Height:        Intake/Output Summary (Last 24 hours) at 07/13/2017 1006 Last data filed at 07/13/2017 0800 Gross per 24 hour  Intake 5930 ml  Output 3418 ml  Net 2512 ml   Filed Weights   07/11/17 0600 07/12/17 0500 07/12/17 2019  Weight: 70.5 kg (155 lb 8 oz) 71.8 kg (158 lb 3.2 oz) 74.9 kg (165 lb 2 oz)   Exam  General: Well developed, well nourished, NAD, appears  stated age  HEENT: NCAT, mucous membranes moist.   Neck: Supple  Cardiovascular: S1 S2 auscultated, no murmur, RRR  Chest: chest tube in place  Respiratory: Diminished breath sounds more so in the lower lung fields, however clear.  Occasional cough.  Abdomen: Soft, nontender, nondistended, + bowel sounds  Extremities: warm dry without cyanosis clubbing or edema  Neuro: AAOx3, nonfocal  Psych: Normal affect and demeanor with intact judgement and insight  Data Reviewed: I have personally reviewed following labs and imaging studies  CBC: Recent Labs  Lab 07/10/17 1625 07/11/17 2120 07/12/17 0637 07/13/17 0306  WBC 21.3* 13.0* 11.1* 14.3*  NEUTROABS 19.4*  --   --   --   HGB 15.8* 14.1 12.1 11.6*  HCT 45.3 42.0 36.6 34.5*  MCV 103.4* 104.5* 104.6* 103.0*  PLT 314 344 320 332   Basic Metabolic Panel: Recent Labs  Lab 07/10/17 1625 07/11/17 2120 07/12/17 0637 07/13/17 0306  NA 130* 132* 131* 131*  K 3.8 3.6 3.3* 3.5  CL 90* 95* 95* 95*  CO2 26 26 25 25   GLUCOSE 122* 118* 84 201*  BUN 19 7 <5* <5*  CREATININE 1.09* 0.67 0.54 0.48  CALCIUM 9.0 8.0* 7.5* 7.4*   GFR: Estimated Creatinine Clearance: 70.4 mL/min (by C-G formula based on SCr of 0.48 mg/dL). Liver Function Tests:  Recent Labs  Lab 07/10/17 1625 07/11/17 2120  AST 12* 19  ALT 7* 11*  ALKPHOS 78 116  BILITOT 0.7 0.7  PROT 6.7 5.4*  ALBUMIN 3.0* 2.5*   No results for input(s): LIPASE, AMYLASE in the last 168 hours. No results for input(s): AMMONIA in the last 168 hours. Coagulation Profile: Recent Labs  Lab 07/10/17 2110 07/11/17 2120  INR 1.07 1.13   Cardiac Enzymes: Recent Labs  Lab 07/10/17 1736 07/10/17 2005 07/11/17 0704 07/11/17 1406  TROPONINI 0.49* 0.55* 0.12* <0.03   BNP (last 3 results) No results for input(s): PROBNP in the last 8760 hours. HbA1C: Recent Labs    07/11/17 0304  HGBA1C 5.0   CBG: No results for input(s): GLUCAP in the last 168 hours. Lipid  Profile: Recent Labs    07/11/17 0304  CHOL 88  HDL 41  LDLCALC 38  TRIG 47  CHOLHDL 2.1   Thyroid Function Tests: Recent Labs    07/11/17 0304  TSH 2.922   Anemia Panel: No results for input(s): VITAMINB12, FOLATE, FERRITIN, TIBC, IRON, RETICCTPCT in the last 72 hours. Urine analysis:    Component Value Date/Time   COLORURINE YELLOW 07/11/2017 2148   APPEARANCEUR HAZY (A) 07/11/2017 2148   LABSPEC 1.006 07/11/2017 2148   PHURINE 6.0 07/11/2017 2148   GLUCOSEU NEGATIVE 07/11/2017 2148   HGBUR MODERATE (A) 07/11/2017 2148   BILIRUBINUR NEGATIVE 07/11/2017 2148   BILIRUBINUR neg 10/11/2011 1425   KETONESUR 5 (A) 07/11/2017 2148   PROTEINUR NEGATIVE 07/11/2017 2148   UROBILINOGEN 0.2 10/11/2011 1425   UROBILINOGEN 0.2 01/24/2008 2217   NITRITE NEGATIVE 07/11/2017 2148   LEUKOCYTESUR LARGE (A) 07/11/2017 2148   Sepsis Labs: @LABRCNTIP (procalcitonin:4,lacticidven:4)  ) Recent Results (from the past 240 hour(s))  Blood Culture (routine x 2)     Status: None (Preliminary result)   Collection Time: 07/10/17  4:34 PM  Result Value Ref Range Status   Specimen Description BLOOD RIGHT ANTECUBITAL  Final   Special Requests   Final    BOTTLES DRAWN AEROBIC AND ANAEROBIC Blood Culture adequate volume   Culture   Final    NO GROWTH 2 DAYS Performed at [facility], [address], [place], [place] [address]    Report Status PENDING  Incomplete  Blood Culture (routine x 2)     Status: None (Preliminary result)   Collection Time: 07/10/17  4:34 PM  Result Value Ref Range Status   Specimen Description BLOOD RIGHT ANTECUBITAL  Final   Special Requests IN PEDIATRIC BOTTLE Blood Culture adequate volume  Final   Culture   Final    NO GROWTH 2 DAYS Performed at [facility], [address], [place], [place] [address]    Report Status PENDING  Incomplete  Urine culture     Status: Abnormal   Collection Time: 07/10/17  6:39 PM  Result Value Ref Range Status    Specimen Description URINE, RANDOM  Final   Special Requests   Final    NONE Performed at [facility], [address], [place], [place] [address]    Culture MULTIPLE SPECIES PRESENT, SUGGEST RECOLLECTION (A)  Final   Report Status 07/12/2017 FINAL  Final  Culture, sputum-assessment     Status: None   Collection Time: 07/11/17 11:54 AM  Result Value Ref Range Status   Specimen Description EXPECTORATED SPUTUM  Final   Special Requests NONE  Final   Sputum evaluation   Final    Sputum specimen not  acceptable for testing.  Please recollect.   Gram Stain Report Called to,Read Back By and Verified With: RN Gildardo GriffesJ STEVENS [phone] MLM Performed at [facility], [address] [place], [place] [address]    Report Status 07/11/2017 FINAL  Final  Gram stain     Status: None   Collection Time: 07/11/17  1:28 PM  Result Value Ref Range Status   Specimen Description PLEURAL  Final   Special Requests RIGHT  Final   Gram Stain   Final    ABUNDANT WBC PRESENT, PREDOMINANTLY PMN NO ORGANISMS SEEN Performed at [facility], [address] [place], [place] [address]    Report Status 07/11/2017 FINAL  Final  Culture, body fluid-bottle     Status: None (Preliminary result)   Collection Time: 07/11/17  1:28 PM  Result Value Ref Range Status   Specimen Description PLEURAL RIGHT  Final   Special Requests FLUID  Final   Culture   Final    NO GROWTH < 24 HOURS Performed at [facility], [address] [place], [place] [address]    Report Status PENDING  Incomplete  Surgical PCR screen     Status: None   Collection Time: 07/12/17  8:06 AM  Result Value Ref Range Status   MRSA, PCR NEGATIVE NEGATIVE Final   Staphylococcus aureus NEGATIVE NEGATIVE Final    Comment: (NOTE) The Xpert SA Assay (FDA approved for NASAL specimens in patients 69 years of age and older), is one component of a comprehensive surveillance program. It is not intended to diagnose infection nor  to guide or monitor treatment. Performed at [facility], [address] [place], [place] [address]   Anaerobic culture     Status: None (Preliminary result)   Collection Time: 07/12/17 10:48 AM  Result Value Ref Range Status   Specimen Description BRONCHIAL WASHINGS RIGHT  Final   Special Requests SPEC A  Final   Culture PENDING  Incomplete   Report Status PENDING  Incomplete  Culture, respiratory (NON-Expectorated)     Status: None (Preliminary result)   Collection Time: 07/12/17 10:48 AM  Result Value Ref Range Status   Specimen Description BRONCHIAL WASHINGS RIGHT  Final   Special Requests SPEC A  Final   Gram Stain   Final    RARE WBC PRESENT, PREDOMINANTLY PMN NO ORGANISMS SEEN Performed at [facility], [address] [place], [place] [address]    Culture PENDING  Incomplete   Report Status PENDING  Incomplete  Body fluid culture     Status: None (Preliminary result)   Collection Time: 07/12/17 11:14 AM  Result Value Ref Range Status   Specimen Description PLEURAL RIGHT  Final   Special Requests SPEC C  Final   Gram Stain   Final    NO WBC SEEN NO ORGANISMS SEEN Performed at [facility], [address] [place], [place] [address]    Culture PENDING  Incomplete   Report Status PENDING  Incomplete  Aerobic/Anaerobic Culture (surgical/deep wound)     Status: None (Preliminary result)   Collection Time: 07/12/17 11:18 AM  Result Value Ref Range Status   Specimen Description TISSUE RIGHT PLEURAL PEEL  Final   Special Requests SPEC D  Final   Gram Stain   Final    MODERATE WBC PRESENT, PREDOMINANTLY PMN NO ORGANISMS SEEN Performed at [facility], [address] [place], [place] [address]    Culture PENDING  Incomplete   Report Status PENDING  Incomplete  Aerobic/Anaerobic Culture (surgical/deep wound)     Status: None (Preliminary result)   Collection Time: 07/12/17 11:49 AM  Result Value Ref Range Status   Specimen Description LUNG  LEFT LOWE LOBE  Final   Special Requests SWABSPEC E  Final   Gram Stain   Final    MODERATE WBC PRESENT,BOTH PMN AND MONONUCLEAR FEW GRAM POSITIVE COCCI IN PAIRS AND CHAINS RARE GRAM POSITIVE RODS Performed at [facility], [address] [place], [place] [address]    Culture PENDING  Incomplete   Report Status PENDING  Incomplete  MRSA PCR Screening     Status: None   Collection Time: 07/12/17  9:03 PM  Result Value Ref Range Status   MRSA by PCR NEGATIVE NEGATIVE Final    Comment:        The GeneXpert MRSA Assay (FDA approved for NASAL specimens only), is one component of a comprehensive MRSA colonization surveillance program. It is not intended to diagnose MRSA infection nor to guide or monitor treatment for MRSA infections. Performed at [facility], [address] [place], [place] [address]       Radiology Studies: Dg Chest 1 View  Result Date: 07/11/2017 CLINICAL DATA:  Patient status post right thoracentesis. EXAM: CHEST 1 VIEW COMPARISON:  Chest radiograph 07/10/2017 FINDINGS: Monitoring leads overlie the patient. Stable cardiac and mediastinal contours. Persistent moderate right pleural effusion. Underlying consolidation right lower lung. Left basilar atelectasis. No pneumothorax. IMPRESSION: Redemonstrated moderate right pleural effusion with underlying consolidation. Electronically Signed   By: Annia Belt M.D.   On: 07/11/2017 13:39   Dg Chest Port 1 View  Result Date: 07/12/2017 CLINICAL DATA:  Status post right video-assisted thoracic surgery. EXAM: PORTABLE CHEST 1 VIEW COMPARISON:  07/11/2017 FINDINGS: Right pleural effusion is smaller than it was previously, small to moderate size, still obscuring the right hemidiaphragm. There is associated right lung base opacity consistent with atelectasis or pneumonia or a combination. Right mid and upper lungs are clear. Left lung is clear. Two right-sided chest tubes have their tips projecting over the right  apex. Right subclavian dual lumen central venous line has its tip projecting in the mid superior vena cava. No pneumothorax. IMPRESSION: 1. Significant decrease in right lower lung zone opacity since the prior study following surgery. There is still a small to moderate pleural effusion with associated lung base opacity either pneumonia, atelectasis or a combination. 2. Two right-sided chest tubes and right subclavian central venous line positioned as detailed. 3. No pneumothorax. Electronically Signed   By: Amie Portland M.D.   On: 07/12/2017 13:38   Ir Thoracentesis Asp Pleural Space W/img Guide  Result Date: 07/11/2017 INDICATION: Patient with right pleural effusion. Request is made for diagnostic and therapeutic thoracentesis. EXAM: ULTRASOUND GUIDED DIAGNOSTIC AND THERAPEUTIC RIGHT THORACENTESIS MEDICATIONS: 10 mL 1% lidocaine COMPLICATIONS: None immediate. PROCEDURE: An ultrasound guided thoracentesis was thoroughly discussed with the patient and questions answered. The benefits, risks, alternatives and complications were also discussed. The patient understands and wishes to proceed with the procedure. Written consent was obtained. Ultrasound was performed to localize and mark an adequate pocket of fluid in the right chest. The area was then prepped and draped in the normal sterile fashion. 1% Lidocaine was used for local anesthesia. Under ultrasound guidance a Safe-T-Centesis catheter was introduced. Thoracentesis was performed. The catheter was removed and a dressing applied. FINDINGS: A total of approximately 320 mL of yellow cloudy fluid  was removed. Samples were sent to the laboratory as requested by the clinical team. IMPRESSION: Successful ultrasound guided diagnostic and therapeutic right thoracentesis yielding 320 mL of pleural fluid. Read by: Loyce Dys PA-C Electronically Signed   By: Richarda Overlie M.D.   On: 07/11/2017 15:47     Scheduled Meds: . acetaminophen  1,000 mg Oral Q6H   Or  .  acetaminophen (TYLENOL) oral liquid 160 mg/5 mL  1,000 mg Oral Q6H  . aspirin  325 mg Oral Daily  . atorvastatin  40 mg Oral q1800  . bisacodyl  10 mg Oral Daily  . fentaNYL   Intravenous Q4H  . folic acid  1 mg Oral Daily  . levalbuterol  0.63 mg Nebulization TID  . LORazepam  0-4 mg Intravenous Q12H  . mouth rinse  15 mL Mouth Rinse BID  . metoCLOPramide (REGLAN) injection  10 mg Intravenous Q6H  . multivitamin with minerals  1 tablet Oral Daily  . nicotine  21 mg Transdermal Daily  . senna-docusate  1 tablet Oral QHS  . thiamine  100 mg Oral Daily   Or  . thiamine  100 mg Intravenous Daily   Continuous Infusions: . sodium chloride Stopped (07/12/17 2226)  . azithromycin Stopped (07/11/17 2246)  . dextrose 5 % and 0.9% NaCl 100 mL/hr at 07/13/17 0800  . lactated ringers    . piperacillin-tazobactam (ZOSYN)  IV 3.375 g (07/13/17 0908)  . potassium chloride       LOS: 3 days   Time Spent in minutes   30 minutes  Kelsha Older D.O. on 07/13/2017 at 10:06 AM  Between 7am to 7pm - Pager - [phone]  After 7pm go to [email or web address] - password [email or web address]  And look for the night coverage person covering for me after hours  [facility] Office  [phone]

## 2017-07-13 NOTE — Op Note (Signed)
Erica Ball, Erica Ball NO.:  [id number]  MEDICAL RECORD NO.:  [id number]  LOCATION:  [id number]                        FACILITY:  [facility]  PHYSICIAN:  Revonda Standard. Roxan Hockey, M.D.DATE OF BIRTH:  20-May-1950  DATE OF PROCEDURE:  07/12/2017 DATE OF DISCHARGE:                              OPERATIVE REPORT   PREOPERATIVE DIAGNOSIS:  Right empyema.  POSTOPERATIVE DIAGNOSIS:  Right empyema.  PROCEDURES:   Bronchoscopy, Right video-assisted thoracoscopy, Drainage of empyema, Visceral and parietal pleural decortication.  SURGEON:  Revonda Standard. Roxan Hockey, MD.  ASSISTANTEllwood Handler, PA.  ANESTHESIA:  General.  FINDINGS:  Normal endobronchial anatomy. Extrinsic compression of the lower and middle lobe bronchi, but no endobronchial lesions seen.  1.3 L of murky loculated fluid and pleural spaces. Visceral and parietal pleural peels. Multiple loculations in the fluid.  A small amount of pus expressed from right lower lobe during decortication.  CLINICAL NOTE:  Erica Ball is a 67 year old woman with a history of tobacco abuse and ethanol use.  She presented with a 4-day history of right-sided chest pain, shortness of breath, and general malaise. Workup revealed a large right pleural effusion.  On CT, this appeared multiloculated.  Thoracentesis was performed and findings were consistent with empyema.  She was advised to undergo bronchoscopy and then right VATS with drainage of the empyema and decortication of the lung.  The indications, risks, benefits, and alternatives were discussed in detail with the patient.  She understood and accepted the risks and agreed to proceed.  OPERATIVE NOTE:  Erica Ball was brought to the operating room on July 12, 2017.  She had induction of general anesthesia and was intubated. She was already receiving intravenous Zosyn.  She was given a dose of vancomycin in the preoperative holding area.  She was anesthetized and intubated.  A  Foley catheter was placed.  Sequential compression devices were placed on the calves for DVT prophylaxis.  After performing a time- out, flexible fiberoptic bronchoscopy was performed via the endotracheal tube.  There were no endobronchial lesions to the level of subsegmental bronchi.  There was extrinsic compression on the right lower and middle lobe airways.  There was a small amount of purulent secretions from the airways.  Bronchoalveolar lavage was performed and the specimen was sent for cultures.  The patient was reintubated with a double-lumen endotracheal tube.  She was placed in a left lateral decubitus position.  The right chest was prepped and draped in usual sterile fashion.  Single lung ventilation of the left lung was initiated and it was tolerated well throughout the procedure.  An incision was made in the seventh interspace in the midaxillary line and it was carried through the skin and subcutaneous tissue.  The pleural space was entered.  A finger was inserted.  Some mild loculations were broken up.  A sucker was advanced into the chest and about a liter of murky fluid was evacuated.  A 5-mm port was placed and the thoracoscope was advanced into the chest.  There was an obvious empyema with visceral and parietal pleural peels.  There were multiple loculations of fluid.  A 5-cm working incision was made in the fourth interspace anterolaterally.  No  rib spreading was performed during the procedure.  The adhesions were broken up and the lung was freed up circumferentially.  There was additional fluid that was loculated up at the apex.  All of the fluid was evacuated.  There was a total of 1.3 L of murky fluid altogether.  Fluid was sent for cytology and cultures as well.  The lower lobe was encased in a pleural peel as was the majority of the middle lobe.  The upper lobe was relatively spared.  There were extensive fluid and adhesions in the fissure and this area was  cleared first.  Some of the peel was sent for cultures and pathology as well. The middle lobe was decorticated.  The visceral peel came off relatively easily as did the parietal peel, although there was bleeding with parietal peel removal.  The lower lobe was then decorticated.  This was a more extensive procedure, but again the peel came off relatively easily without significant visceral pleural disruption.  The lung was reinflated and there was good expansion of all 3 lobes.  The chest was then copiously irrigated with 2 L of warm saline.  A 28-French Blake drain was placed in the original port incision and directed posteriorly. A 28-French Keenan Bachelor was placed through a separate incision anteriorly and directed superiorly to the apex.  Chest tubes were secured with #1 silk sutures.  The right lung was then reinflated and the working incision was closed in 3 layers in the standard fashion.  The chest tubes were placed to suction.  The patient was placed back in a supine position. She was extubated in the operating room and taken to the postanesthetic care unit in good condition.     Revonda Standard Roxan Hockey, M.D.     SCH/MEDQ  D:  07/12/2017  T:  07/13/2017  Job:  [id number]

## 2017-07-14 ENCOUNTER — Inpatient Hospital Stay (HOSPITAL_COMMUNITY): Payer: BC Managed Care – PPO

## 2017-07-14 LAB — COMPREHENSIVE METABOLIC PANEL
ALBUMIN: 1.7 g/dL — AB (ref 3.5–5.0)
ALT: 27 U/L (ref 14–54)
ANION GAP: 7 (ref 5–15)
AST: 44 U/L — ABNORMAL HIGH (ref 15–41)
Alkaline Phosphatase: 77 U/L (ref 38–126)
BILIRUBIN TOTAL: 0.3 mg/dL (ref 0.3–1.2)
BUN: 6 mg/dL (ref 6–20)
CO2: 27 mmol/L (ref 22–32)
Calcium: 7.4 mg/dL — ABNORMAL LOW (ref 8.9–10.3)
Chloride: 100 mmol/L — ABNORMAL LOW (ref 101–111)
Creatinine, Ser: 0.58 mg/dL (ref 0.44–1.00)
GFR calc Af Amer: >60 mL/min (ref >60)
GLUCOSE: 118 mg/dL — AB (ref 65–99)
Potassium: 3.1 mmol/L — ABNORMAL LOW (ref 3.5–5.1)
Sodium: 134 mmol/L — ABNORMAL LOW (ref 135–145)
TOTAL PROTEIN: 4.3 g/dL — AB (ref 6.5–8.1)

## 2017-07-14 LAB — CBC
HEMATOCRIT: 32.6 % — AB (ref 36.0–46.0)
HEMOGLOBIN: 10.9 g/dL — AB (ref 12.0–15.0)
MCH: 35 pg — AB (ref 26.0–34.0)
MCHC: 33.4 g/dL (ref 30.0–36.0)
MCV: 104.8 fL — AB (ref 78.0–100.0)
Platelets: 315 10*3/uL (ref 150–400)
RBC: 3.11 MIL/uL — ABNORMAL LOW (ref 3.87–5.11)
RDW: 13.3 % (ref 11.5–15.5)
WBC: 10.1 10*3/uL (ref 4.0–10.5)

## 2017-07-14 LAB — CULTURE, RESPIRATORY

## 2017-07-14 LAB — MAGNESIUM: MAGNESIUM: 1.6 mg/dL — AB (ref 1.7–2.4)

## 2017-07-14 LAB — CULTURE, RESPIRATORY W GRAM STAIN: Culture: NORMAL

## 2017-07-14 MED ORDER — METOPROLOL TARTRATE 5 MG/5ML IV SOLN
INTRAVENOUS | Status: AC
  Filled 2017-07-14: qty 5

## 2017-07-14 MED ORDER — METOPROLOL TARTRATE 5 MG/5ML IV SOLN
5.0000 mg | Freq: Once | INTRAVENOUS | Status: AC
  Administered 2017-07-14: 5 mg via INTRAVENOUS

## 2017-07-14 MED ORDER — POTASSIUM CHLORIDE CRYS ER 20 MEQ PO TBCR
40.0000 meq | EXTENDED_RELEASE_TABLET | Freq: Once | ORAL | Status: AC
Start: 2017-07-14 — End: 2017-07-14
  Administered 2017-07-14: 40 meq via ORAL
  Filled 2017-07-14: qty 2

## 2017-07-14 NOTE — Progress Notes (Signed)
Pt states she does not want to get back in the bed to have chest tube removed at this time, her family is at bedside and she states she wants to stay in chair to visit.

## 2017-07-14 NOTE — Progress Notes (Signed)
Pt refuses to get up into chair for breakfast, she states she is too tired. Pt does not want to wear pulse ox, she states it bothers her. Will continue to spot check her O2 sats until she will agree to wear pulse ox.

## 2017-07-14 NOTE — Progress Notes (Signed)
Pt got up to use BSC and went into SVT 180's. Notified Dr Catha GosselinMikhail and order received for IV metoprolol 5mg , ekg, echo and lab. After metoprolol given pt went back to SR in the 80's. B/P 126/83. Pt states she feels her heart fluttering.

## 2017-07-14 NOTE — Progress Notes (Signed)
PROGRESS NOTE    Erica AlimentKathleen M Davidoff  ZOX:[id number] DOB: April 12, 1951 DOA: 07/10/2017 PCP: Patient, No Pcp Per   Brief Narrative:  HPI On 07/10/2017 by Dr. Lorretta HarpXilin Niu Erica AlimentKathleen M Ball is a 67 y.o. female with medical history significant of tobacco and alcohol abuse, hypertension, who presents with shortness breath, cough, chest pain and generalized weakness.  Patient states that she has been having dry cough and SOB in the past 4 days, which has been progressively getting worse. She has right sided chest pain, which is constant, sharp, 10 out of 10 in severity, radiating to the right flank area . It is pleuritic, aggravated by deep breath. No hemoptysis. No tenderness in calf areas. Patient does not have fever or chills. Patient denies nausea, vomiting, diarrhea, abdominal pain, symptoms of UTI or unilateral weakness. Pt was seen by PCP today. This is her first visit with this provider. CXR was taken and she was called back by PCP, instructed to come to ED due to "fluid in lung". Patient reports smoking a pack a day for 48years and drinks vodka every daily. Pt can barely speak in full sentence.  Interim history Admitted with hypoxia and respiratory failure, found to have empyema. PCCM and CTS surgery consulted. Underwent bronch, VATS.   Assessment & Plan   Acute respiratory failure with hypoxia/right pleural effusion-Empyema  -CTA was negative for pulmonary embolism however showed large right-sided pleural effusion, right lower lobe bronchus obstruction and possible lobar pneumonia -Continue supplemental oxygen to maintain saturations above 92% -Influenza PCR negative -Interventional radiology consulted and appreciated for ultrasound-guided thoracentesis-which yielded 320 mL of yellow, cloudy fluid -Pulmonology consulted for possible bronchoscopy given loculations noted on CT scan as well as possible bronchus obstruction -Pleural fluid LDH 1193, WBC 10K, Glucose <20 --> consistent with  empyema -culture from thoracentesis shows no growth -Cardiothoracic surgery consulted and appreciated, s/p bronchoscopy, right VATS, drainage of empyema and decortication, chest tube placement. Cultures pending.  -Urine strep pneumonia antigen negative -Continue nebulizer treatments and guaifenesin -Patient initially placed on azithromycin and ceftriaxone however transitioned to Zosyn and azithromycin -Continue incentive spirometry- discussed with patient -management of chest tube per cardiothoracic surgery  Sepsis secondary to UTI/pneumonia/Empyema -Patient presented with leukocytosis, tachycardia and tachypnea- all improved -UA: Many bacteria, TNTC WBC, moderate leukocytes -Possible pneumonia noted on CTA -Continue azithromycin and zosyn -Blood cultures no growth to date -Urine culture multiple species  Acute kidney injury -Resolved, Suspect secondary to dehydration and sepsis -Upon admission, Creatinine 1.09, baseline truly unknown, as last creatinine was 0.5 in 2013 -Creatinine currently 0.58 -Continue to monitor BMP  Elevated troponin -Patient presented with pleuritic type chest pain, however does have pleural effusion and likely pneumonia/empyema -Suspect troponin due to demand ischemia and sepsis -Cardiology was contacted by the ED and spoke with Dr. Harlow MaresVerdre.  Recommended to continue cycling troponin, did not feel this is ACS related. -Plan currently cycle, improving down 0.55 to <0.03 -Echocardiogram EF 60-65%, grade 1 diastolic dysfunction, aortic valve sclerosis -Continue statin, aspirin added -LDL 38  Hypokalemia -Will replace and continue to monitor BMP  Essential hypertension -Given sepsis, will hold amlodipine -Continue to monitor closely, hydralazine as needed  Tobacco/alcohol abuse -Discussed cessation -Continue CIWA protocol and nicotine patch  Macrocytic anemia -likely due to recent surgery and dilutional component -hemoglobin currently 10.9 -will  continue to monitor CBC  DVT Prophylaxis  SCDs  Code Status: Full  Family Communication: None at bedside  Disposition Plan: Admitted. Pending further recommendations from CT surgery. Dispo TBD  Consultants  Interventional radiology PCCM Cardiothoracic surgery  Procedures  Right US guided thoracentesis  Echocardiogram Bronchoscopy, right video-assisted thoracic drainage of empyema, visceral and parietal pleural decortication  Antibiotics   Anti-infectives (From admission, onward)   Start     Dose/Rate Route Frequency Ordered Stop   07/12/17 0800  vancomycin (VANCOCIN) IVPB 1000 mg/200 mL premix     1,000 mg 200 mL/hr over 60 Minutes Intravenous To Surgery 07/11/17 1944 07/12/17 1050   07/11/17 2000  azithromycin (ZITHROMAX) 500 mg in sodium chloride 0.9 % 250 mL IVPB     500 mg 250 mL/hr over 60 Minutes Intravenous Every 24 hours 07/10/17 2054 07/18/17 1959   07/11/17 1800  cefTRIAXone (ROCEPHIN) 1 g in sodium chloride 0.9 % 100 mL IVPB  Status:  Discontinued     1 g 200 mL/hr over 30 Minutes Intravenous Every 24 hours 07/10/17 2054 07/11/17 1512   07/11/17 1600  piperacillin-tazobactam (ZOSYN) IVPB 3.375 g     3.375 g 12.5 mL/hr over 240 Minutes Intravenous Every 8 hours 07/11/17 1550     07/10/17 1815  cefTRIAXone (ROCEPHIN) 1 g in sodium chloride 0.9 % 100 mL IVPB     1 g 200 mL/hr over 30 Minutes Intravenous  Once 07/10/17 1812 07/10/17 1912   07/10/17 1815  azithromycin (ZITHROMAX) 500 mg in sodium chloride 0.9 % 250 mL IVPB     500 mg 250 mL/hr over 60 Minutes Intravenous  Once 07/10/17 1812 07/10/17 2209      Subjective:   Erica Ball seen and examined today, eating breakfast.  Patient feeling better today.  Continues to have pain particularly at chest tube site.  Denies chest pain, abdominal pain, nausea or vomiting, diarrhea or constipation, headache or dizziness.  Objective:   Vitals:   07/14/17 0422 07/14/17 0721 07/14/17 0733 07/14/17 0758  BP: 121/76  (!) 143/75    Pulse: 74 78    Resp: 15 18 17    Temp: 97.7 F (36.5 C) 97.8 F (36.6 C)    TempSrc: Oral Oral    SpO2: 96% 94% 92% 92%  Weight:      Height:        Intake/Output Summary (Last 24 hours) at 07/14/2017 1106 Last data filed at 07/14/2017 0930 Gross per 24 hour  Intake 3610 ml  Output 2915 ml  Net 695 ml   Filed Weights   07/11/17 0600 07/12/17 0500 07/12/17 2019  Weight: 70.5 kg (155 lb 8 oz) 71.8 kg (158 lb 3.2 oz) 74.9 kg (165 lb 2 oz)   Exam  General: Well developed, well nourished, NAD, appears stated age  HEENT: NCAT, PERRLA, EOMI, Anicteic Sclera, mucous membranes moist.   Neck: Supple, no JVD, no masses  Cardiovascular: S1 S2 auscultated, RRR, no murmur  Respiratory: Diminished breath sounds on the right, however clear. Patient not wanting to inhale deeply.  Chest: R chest tubes in place  Abdomen: Soft, nontender, nondistended, + bowel sounds  Extremities: warm dry without cyanosis clubbing or edema  Neuro: AAOx3, nonfocal  Psych: appropriate mood and affect  Data Reviewed: I have personally reviewed following labs and imaging studies  CBC: Recent Labs  Lab 07/10/17 1625 07/11/17 2120 07/12/17 0637 07/13/17 0306 07/14/17 0424  WBC 21.3* 13.0* 11.1* 14.3* 10.1  NEUTROABS 19.4*  --   --   --   --   HGB 15.8* 14.1 12.1 11.6* 10.9*  HCT 45.3 42.0 36.6 34.5* 32.6*  MCV 103.4* 104.5* 104.6* 103.0* 104.8*  PLT 314 344 320 332 315  Basic Metabolic Panel: Recent Labs  Lab 07/10/17 1625 07/11/17 2120 07/12/17 0637 07/13/17 0306 07/14/17 0424  NA 130* 132* 131* 131* 134*  K 3.8 3.6 3.3* 3.5 3.1*  CL 90* 95* 95* 95* 100*  CO2 26 26 25 25 27   GLUCOSE 122* 118* 84 201* 118*  BUN 19 7 <5* <5* 6  CREATININE 1.09* 0.67 0.54 0.48 0.58  CALCIUM 9.0 8.0* 7.5* 7.4* 7.4*   GFR: Estimated Creatinine Clearance: 70.4 mL/min (by C-G formula based on SCr of 0.58 mg/dL). Liver Function Tests: Recent Labs  Lab 07/10/17 1625 07/11/17 2120  07/14/17 0424  AST 12* 19 44*  ALT 7* 11* 27  ALKPHOS 78 116 77  BILITOT 0.7 0.7 0.3  PROT 6.7 5.4* 4.3*  ALBUMIN 3.0* 2.5* 1.7*   No results for input(s): LIPASE, AMYLASE in the last 168 hours. No results for input(s): AMMONIA in the last 168 hours. Coagulation Profile: Recent Labs  Lab 07/10/17 2110 07/11/17 2120  INR 1.07 1.13   Cardiac Enzymes: Recent Labs  Lab 07/10/17 1736 07/10/17 2005 07/11/17 0704 07/11/17 1406  TROPONINI 0.49* 0.55* 0.12* <0.03   BNP (last 3 results) No results for input(s): PROBNP in the last 8760 hours. HbA1C: No results for input(s): HGBA1C in the last 72 hours. CBG: No results for input(s): GLUCAP in the last 168 hours. Lipid Profile: No results for input(s): CHOL, HDL, LDLCALC, TRIG, CHOLHDL, LDLDIRECT in the last 72 hours. Thyroid Function Tests: No results for input(s): TSH, T4TOTAL, FREET4, T3FREE, THYROIDAB in the last 72 hours. Anemia Panel: No results for input(s): VITAMINB12, FOLATE, FERRITIN, TIBC, IRON, RETICCTPCT in the last 72 hours. Urine analysis:    Component Value Date/Time   COLORURINE YELLOW 07/11/2017 2148   APPEARANCEUR HAZY (A) 07/11/2017 2148   LABSPEC 1.006 07/11/2017 2148   PHURINE 6.0 07/11/2017 2148   GLUCOSEU NEGATIVE 07/11/2017 2148   HGBUR MODERATE (A) 07/11/2017 2148   BILIRUBINUR NEGATIVE 07/11/2017 2148   BILIRUBINUR neg 10/11/2011 1425   KETONESUR 5 (A) 07/11/2017 2148   PROTEINUR NEGATIVE 07/11/2017 2148   UROBILINOGEN 0.2 10/11/2011 1425   UROBILINOGEN 0.2 01/24/2008 2217   NITRITE NEGATIVE 07/11/2017 2148   LEUKOCYTESUR LARGE (A) 07/11/2017 2148   Sepsis Labs: @LABRCNTIP (procalcitonin:4,lacticidven:4)  ) Recent Results (from the past 240 hour(s))  Blood Culture (routine x 2)     Status: None (Preliminary result)   Collection Time: 07/10/17  4:34 PM  Result Value Ref Range Status   Specimen Description BLOOD RIGHT ANTECUBITAL  Final   Special Requests   Final    BOTTLES DRAWN AEROBIC  AND ANAEROBIC Blood Culture adequate volume   Culture   Final    NO GROWTH 3 DAYS Performed at [facility], [address] [place], [place] [address]    Report Status PENDING  Incomplete  Blood Culture (routine x 2)     Status: None (Preliminary result)   Collection Time: 07/10/17  4:34 PM  Result Value Ref Range Status   Specimen Description BLOOD RIGHT ANTECUBITAL  Final   Special Requests IN PEDIATRIC BOTTLE Blood Culture adequate volume  Final   Culture   Final    NO GROWTH 3 DAYS Performed at [facility], [address] [place], [place] [address]    Report Status PENDING  Incomplete  Urine culture     Status: Abnormal   Collection Time: 07/10/17  6:39 PM  Result Value Ref Range Status   Specimen Description URINE, RANDOM  Final  Special Requests   Final    NONE Performed at [facility], [address] [place], [place] [address]    Culture MULTIPLE SPECIES PRESENT, SUGGEST RECOLLECTION (A)  Final   Report Status 07/12/2017 FINAL  Final  Culture, sputum-assessment     Status: None   Collection Time: 07/11/17 11:54 AM  Result Value Ref Range Status   Specimen Description EXPECTORATED SPUTUM  Final   Special Requests NONE  Final   Sputum evaluation   Final    Sputum specimen not acceptable for testing.  Please recollect.   Gram Stain Report Called to,Read Back By and Verified With: RN Gildardo Griffes [phone] MLM Performed at [facility], [address] [place], [place] [address]    Report Status 07/11/2017 FINAL  Final  Gram stain     Status: None   Collection Time: 07/11/17  1:28 PM  Result Value Ref Range Status   Specimen Description PLEURAL  Final   Special Requests RIGHT  Final   Gram Stain   Final    ABUNDANT WBC PRESENT, PREDOMINANTLY PMN NO ORGANISMS SEEN Performed at [facility], [address], [place], [place] [address]    Report Status 07/11/2017 FINAL  Final  Fungus Culture With Stain     Status: None (Preliminary  result)   Collection Time: 07/11/17  1:28 PM  Result Value Ref Range Status   Fungus Stain Final report  Final    Comment: (NOTE) Performed At: [facility] [address] [place], [place] [id number] Jolene Schimke MD BM:[id number]    Fungus (Mycology) Culture PENDING  Incomplete   Fungal Source FLUID  Final    Comment: PLEURAL  Culture, body fluid-bottle     Status: None (Preliminary result)   Collection Time: 07/11/17  1:28 PM  Result Value Ref Range Status   Specimen Description PLEURAL RIGHT  Final   Special Requests FLUID  Final   Culture   Final    NO GROWTH 2 DAYS Performed at [facility], [address] [place], [place] [address]    Report Status PENDING  Incomplete  Fungus Culture Result     Status: None   Collection Time: 07/11/17  1:28 PM  Result Value Ref Range Status   Result 1 Comment  Final    Comment: (NOTE) KOH/Calcofluor preparation:  no fungus observed. Performed At: [facility] [address] [place], [place] [id number] Jolene Schimke MD QV:[id number]   Surgical PCR screen     Status: None   Collection Time: 07/12/17  8:06 AM  Result Value Ref Range Status   MRSA, PCR NEGATIVE NEGATIVE Final   Staphylococcus aureus NEGATIVE NEGATIVE Final    Comment: (NOTE) The Xpert SA Assay (FDA approved for NASAL specimens in patients 51 years of age and older), is one component of a comprehensive surveillance program. It is not intended to diagnose infection nor to guide or monitor treatment. Performed at [facility], [address] [place], [place] [address]   Anaerobic culture     Status: None (Preliminary result)   Collection Time: 07/12/17 10:48 AM  Result Value Ref Range Status   Specimen Description BRONCHIAL WASHINGS RIGHT  Final   Special Requests SPEC A  Final   Culture PENDING  Incomplete   Report Status PENDING  Incomplete  Culture, respiratory (NON-Expectorated)     Status: None (Preliminary result)   Collection  Time: 07/12/17 10:48 AM  Result Value Ref Range Status  Specimen Description BRONCHIAL WASHINGS RIGHT  Final   Special Requests SPEC A  Final   Gram Stain   Final    RARE WBC PRESENT, PREDOMINANTLY PMN NO ORGANISMS SEEN    Culture   Final    CULTURE REINCUBATED FOR BETTER GROWTH Performed at [facility], [address], [place], [place] [address]    Report Status PENDING  Incomplete  Body fluid culture     Status: None (Preliminary result)   Collection Time: 07/12/17 11:14 AM  Result Value Ref Range Status   Specimen Description PLEURAL RIGHT  Final   Special Requests SPEC C  Final   Gram Stain NO WBC SEEN NO ORGANISMS SEEN   Final   Culture   Final    NO GROWTH 2 DAYS Performed at [facility], [address] [place], [place] [address]    Report Status PENDING  Incomplete  Aerobic/Anaerobic Culture (surgical/deep wound)     Status: None (Preliminary result)   Collection Time: 07/12/17 11:18 AM  Result Value Ref Range Status   Specimen Description TISSUE RIGHT PLEURAL PEEL  Final   Special Requests SPEC D  Final   Gram Stain   Final    MODERATE WBC PRESENT, PREDOMINANTLY PMN NO ORGANISMS SEEN    Culture   Final    NO GROWTH 1 DAY Performed at [facility], [address] [place], [place] [address]    Report Status PENDING  Incomplete  Aerobic/Anaerobic Culture (surgical/deep wound)     Status: None (Preliminary result)   Collection Time: 07/12/17 11:49 AM  Result Value Ref Range Status   Specimen Description LUNG LEFT LOWE LOBE  Final   Special Requests SWABSPEC E  Final   Gram Stain   Final    MODERATE WBC PRESENT,BOTH PMN AND MONONUCLEAR FEW GRAM POSITIVE COCCI IN PAIRS AND CHAINS RARE GRAM POSITIVE RODS    Culture   Final    TOO YOUNG TO READ Performed at [facility], [address] [place], [place] [address]    Report Status PENDING  Incomplete  MRSA PCR Screening     Status: None   Collection Time: 07/12/17  9:03 PM  Result  Value Ref Range Status   MRSA by PCR NEGATIVE NEGATIVE Final    Comment:        The GeneXpert MRSA Assay (FDA approved for NASAL specimens only), is one component of a comprehensive MRSA colonization surveillance program. It is not intended to diagnose MRSA infection nor to guide or monitor treatment for MRSA infections. Performed at [facility] Lab, [address] [place], [place] [address]       Radiology Studies: Dg Chest Port 1 View  Result Date: 07/14/2017 CLINICAL DATA:  Empyema EXAM: PORTABLE CHEST 1 VIEW COMPARISON:  07/13/2017 FINDINGS: Right chest tube remains in place with small right apical pneumothorax, stable. Small right pleural effusion. Bibasilar atelectasis, right greater than left, stable. No acute bony abnormality. IMPRESSION: Stable small right apical pneumothorax. Bibasilar atelectasis and small right effusion, stable. Electronically Signed   By: Charlett Nose M.D.   On: 07/14/2017 07:43   Dg Chest Port 1 View  Result Date: 07/13/2017 CLINICAL DATA:  Emphysema EXAM: PORTABLE CHEST 1 VIEW COMPARISON:  07/12/2017; 07/11/2017; 07/10/2017; chest CT - 07/10/2017 FINDINGS: Grossly unchanged cardiac silhouette and mediastinal contours with atherosclerotic plaque within the thoracic aorta. Stable positioning of support apparatus. Suspected tiny right apical pneumothorax. Small to moderate size right-sided effusion with  associated right basilar heterogeneous/consolidative opacities, unchanged. Suspected trace left-sided effusion. Mild pulmonary venous congestion without frank evidence of edema. No new focal airspace opacities. No acute osseus abnormalities. IMPRESSION: 1. Stable positioning of support apparatus with development of suspected tiny right apical pneumothorax. Continued attention on follow-up is advised. 2. Grossly unchanged small to moderate-sized right-sided effusion with associated right basilar opacities, atelectasis versus infiltrate. Electronically Signed    By: Simonne Come M.D.   On: 07/13/2017 14:03   Dg Chest Port 1 View  Result Date: 07/12/2017 CLINICAL DATA:  Status post right video-assisted thoracic surgery. EXAM: PORTABLE CHEST 1 VIEW COMPARISON:  07/11/2017 FINDINGS: Right pleural effusion is smaller than it was previously, small to moderate size, still obscuring the right hemidiaphragm. There is associated right lung base opacity consistent with atelectasis or pneumonia or a combination. Right mid and upper lungs are clear. Left lung is clear. Two right-sided chest tubes have their tips projecting over the right apex. Right subclavian dual lumen central venous line has its tip projecting in the mid superior vena cava. No pneumothorax. IMPRESSION: 1. Significant decrease in right lower lung zone opacity since the prior study following surgery. There is still a small to moderate pleural effusion with associated lung base opacity either pneumonia, atelectasis or a combination. 2. Two right-sided chest tubes and right subclavian central venous line positioned as detailed. 3. No pneumothorax. Electronically Signed   By: Amie Portland M.D.   On: 07/12/2017 13:38     Scheduled Meds: . acetaminophen  1,000 mg Oral Q6H   Or  . acetaminophen (TYLENOL) oral liquid 160 mg/5 mL  1,000 mg Oral Q6H  . aspirin  325 mg Oral Daily  . atorvastatin  40 mg Oral q1800  . bisacodyl  10 mg Oral Daily  . fentaNYL   Intravenous Q4H  . folic acid  1 mg Oral Daily  . levalbuterol  0.63 mg Nebulization TID  . LORazepam  0-4 mg Intravenous Q12H  . mouth rinse  15 mL Mouth Rinse BID  . multivitamin with minerals  1 tablet Oral Daily  . nicotine  21 mg Transdermal Daily  . senna-docusate  1 tablet Oral QHS  . thiamine  100 mg Oral Daily   Or  . thiamine  100 mg Intravenous Daily   Continuous Infusions: . sodium chloride Stopped (07/12/17 2226)  . azithromycin Stopped (07/13/17 2211)  . dextrose 5 % and 0.9% NaCl 100 mL/hr at 07/14/17 0900  . lactated ringers     . piperacillin-tazobactam (ZOSYN)  IV 3.375 g (07/14/17 0844)  . potassium chloride       LOS: 4 days   Time Spent in minutes   30 minutes  Chandi Nicklin D.O. on 07/14/2017 at 11:06 AM  Between 7am to 7pm - Pager - [phone]  After 7pm go to [email or web address] - password [email or web address]  And look for the night coverage person covering for me after hours  [facility] Office  [phone]

## 2017-07-14 NOTE — Progress Notes (Addendum)
      [address]       [place] [id number]             [phone]      2 Days Post-Op Procedure(s) (LRB): right VIDEO ASSISTED THORACOSCOPY (VATS) for drainage of EMPYEMA and decortication (Right) VIDEO BRONCHOSCOPY (N/A) Subjective: Feels okay this morning. Eating breakfast.   Objective: Vital signs in last 24 hours: Temp:  [97.3 F (36.3 C)-98.6 F (37 C)] 97.8 F (36.6 C) (03/10 0721) Pulse Rate:  [74-149] 78 (03/10 0721) Cardiac Rhythm: Normal sinus rhythm (03/10 0722) Resp:  [14-22] 17 (03/10 0733) BP: (116-143)/(60-80) 143/75 (03/10 0721) SpO2:  [91 %-97 %] 92 % (03/10 0758)     Intake/Output from previous day: 03/09 0701 - 03/10 0700 In: 4010 [P.O.:720; I.V.:2640; IV Piggyback:650] Out: 1465 [Urine:1325; Chest Tube:140] Intake/Output this shift: Total I/O In: 590 [P.O.:240; I.V.:300; IV Piggyback:50] Out: -   General appearance: alert, cooperative and no distress Heart: regular rate and rhythm, S1, S2 normal, no murmur, click, rub or gallop Lungs: clear to auscultation bilaterally Abdomen: soft, non-tender; bowel sounds normal; no masses,  no organomegaly Extremities: extremities normal, atraumatic, no cyanosis or edema Wound: clean and dry  Lab Results: Recent Labs    07/13/17 0306 07/14/17 0424  WBC 14.3* 10.1  HGB 11.6* 10.9*  HCT 34.5* 32.6*  PLT 332 315   BMET:  Recent Labs    07/13/17 0306 07/14/17 0424  NA 131* 134*  K 3.5 3.1*  CL 95* 100*  CO2 25 27  GLUCOSE 201* 118*  BUN <5* 6  CREATININE 0.48 0.58  CALCIUM 7.4* 7.4*    PT/INR:  Recent Labs    07/11/17 2120  LABPROT 14.4  INR 1.13   ABG    Component Value Date/Time   PHART 7.412 07/13/2017 0210   HCO3 28.0 07/13/2017 0210   O2SAT 96.0 07/13/2017 0210   CBG (last 3)  No results for input(s): GLUCAP in the last 72 hours.  Assessment/Plan: S/P Procedure(s) (LRB): right VIDEO ASSISTED THORACOSCOPY (VATS) for drainage of EMPYEMA and decortication  (Right) VIDEO BRONCHOSCOPY (N/A)   1. CV- NSR in the 80s. BP stable 2. Pulm-140cc/24 hours out of chest tubes. Keep tube for now. Tolerating 1L [place]. CXR shows small stable right apical pneumothorax. Bibasilar atelectasis and small right pleural effusion.  3. Renal-creatinine 0.48. Potassium 3.1, oral potassium already given 4. H and H stable 5. Endo-not a diabetic, A1C 5.0 6. ID- continue Zosyn and Azithromycin. Initial cultures show GPC and GPR. Final results pending.   Plan: May be able to remove one chest tube today. Encourage incentive spirometer. Ambulate in the halls. Medical care per primary.      LOS: 4 days    Sharlene Doryessa N Conte 07/14/2017   Chart reviewed, patient examined, agree with above. Chest tube output low, serous. Afebrile and WBC down to normal. Will remove anterior tube.

## 2017-07-15 ENCOUNTER — Inpatient Hospital Stay (HOSPITAL_COMMUNITY): Payer: BC Managed Care – PPO

## 2017-07-15 DIAGNOSIS — J9601 Acute respiratory failure with hypoxia: Secondary | ICD-10-CM

## 2017-07-15 LAB — BASIC METABOLIC PANEL
ANION GAP: 10 (ref 5–15)
CALCIUM: 7.9 mg/dL — AB (ref 8.9–10.3)
CO2: 27 mmol/L (ref 22–32)
CREATININE: 0.4 mg/dL — AB (ref 0.44–1.00)
Chloride: 97 mmol/L — ABNORMAL LOW (ref 101–111)
GFR calc Af Amer: >60 mL/min (ref >60)
GFR calc non Af Amer: >60 mL/min (ref >60)
GLUCOSE: 86 mg/dL (ref 65–99)
Potassium: 3.3 mmol/L — ABNORMAL LOW (ref 3.5–5.1)
Sodium: 134 mmol/L — ABNORMAL LOW (ref 135–145)

## 2017-07-15 LAB — CBC
HEMATOCRIT: 35.3 % — AB (ref 36.0–46.0)
Hemoglobin: 11.8 g/dL — ABNORMAL LOW (ref 12.0–15.0)
MCH: 34.7 pg — AB (ref 26.0–34.0)
MCHC: 33.4 g/dL (ref 30.0–36.0)
MCV: 103.8 fL — AB (ref 78.0–100.0)
Platelets: 385 10*3/uL (ref 150–400)
RBC: 3.4 MIL/uL — ABNORMAL LOW (ref 3.87–5.11)
RDW: 13 % (ref 11.5–15.5)
WBC: 9.3 10*3/uL (ref 4.0–10.5)

## 2017-07-15 LAB — CULTURE, BLOOD (ROUTINE X 2)
CULTURE: NO GROWTH
CULTURE: NO GROWTH
SPECIAL REQUESTS: ADEQUATE
Special Requests: ADEQUATE

## 2017-07-15 LAB — BODY FLUID CULTURE
Culture: NO GROWTH
Gram Stain: NONE SEEN

## 2017-07-15 MED ORDER — ENOXAPARIN SODIUM 40 MG/0.4ML ~~LOC~~ SOLN
40.0000 mg | SUBCUTANEOUS | Status: DC
  Administered 2017-07-15 – 2017-07-18 (×4): 40 mg via SUBCUTANEOUS
  Filled 2017-07-15 (×4): qty 0.4

## 2017-07-15 MED ORDER — POTASSIUM CHLORIDE CRYS ER 20 MEQ PO TBCR
40.0000 meq | EXTENDED_RELEASE_TABLET | Freq: Two times a day (BID) | ORAL | Status: AC
  Administered 2017-07-15 (×2): 40 meq via ORAL
  Filled 2017-07-15 (×2): qty 2

## 2017-07-15 MED ORDER — LEVALBUTEROL HCL 0.63 MG/3ML IN NEBU
0.6300 mg | INHALATION_SOLUTION | Freq: Two times a day (BID) | RESPIRATORY_TRACT | Status: DC
  Administered 2017-07-15 – 2017-07-18 (×6): 0.63 mg via RESPIRATORY_TRACT
  Filled 2017-07-15 (×6): qty 3

## 2017-07-15 MED ORDER — MAGNESIUM SULFATE 2 GM/50ML IV SOLN
2.0000 g | Freq: Once | INTRAVENOUS | Status: AC
  Administered 2017-07-15: 2 g via INTRAVENOUS
  Filled 2017-07-15: qty 50

## 2017-07-15 MED ORDER — SODIUM CHLORIDE 0.9 % IV SOLN
2.0000 g | INTRAVENOUS | Status: DC
  Administered 2017-07-15 – 2017-07-17 (×3): 2 g via INTRAVENOUS
  Filled 2017-07-15 (×4): qty 20

## 2017-07-15 MED ORDER — SODIUM CHLORIDE 0.9% FLUSH: 10.0000 mL | INTRAVENOUS | Status: DC | PRN

## 2017-07-15 MED ORDER — SODIUM CHLORIDE 0.9% FLUSH
10.0000 mL | Freq: Two times a day (BID) | INTRAVENOUS | Status: DC
  Administered 2017-07-15 – 2017-07-16 (×2): 10 mL
  Administered 2017-07-17 – 2017-07-18 (×2): 20 mL
  Administered 2017-07-18: 10 mL

## 2017-07-15 NOTE — Plan of Care (Signed)
Continue current care plan,

## 2017-07-15 NOTE — Plan of Care (Signed)
Continue current care paln 

## 2017-07-15 NOTE — Progress Notes (Signed)
3 Days Post-Op Procedure(s) (LRB): right VIDEO ASSISTED THORACOSCOPY (VATS) for drainage of EMPYEMA and decortication (Right) VIDEO BRONCHOSCOPY (N/A) Subjective: Feels well this AM, minimal pain from tube  Objective: Vital signs in last 24 hours: Temp:  [98.1 F (36.7 C)-99 F (37.2 C)] 99 F (37.2 C) (03/11 0517) Pulse Rate:  [75-88] 85 (03/11 0517) Cardiac Rhythm: Normal sinus rhythm (03/11 0700) Resp:  [14-21] 20 (03/11 0517) BP: (116-141)/(60-88) 138/76 (03/11 0517) SpO2:  [92 %-98 %] 96 % (03/11 0738)  Hemodynamic parameters for last 24 hours:    Intake/Output from previous day: 03/10 0701 - 03/11 0700 In: 3781.7 [P.O.:960; I.V.:2421.7; IV Piggyback:400] Out: 3010 [Urine:2900; Chest Tube:110] Intake/Output this shift: No intake/output data recorded.  General appearance: alert, cooperative and no distress Neurologic: intact Heart: regular rate and rhythm Lungs: diminished breath sounds rigth base Wound: clean and dry  Lab Results: Recent Labs    07/14/17 0424 07/15/17 0601  WBC 10.1 9.3  HGB 10.9* 11.8*  HCT 32.6* 35.3*  PLT 315 385   BMET:  Recent Labs    07/14/17 0424 07/15/17 0601  NA 134* 134*  K 3.1* 3.3*  CL 100* 97*  CO2 27 27  GLUCOSE 118* 86  BUN 6 <5*  CREATININE 0.58 0.40*  CALCIUM 7.4* 7.9*    PT/INR: No results for input(s): LABPROT, INR in the last 72 hours. ABG    Component Value Date/Time   PHART 7.412 07/13/2017 0210   HCO3 28.0 07/13/2017 0210   O2SAT 96.0 07/13/2017 0210   CBG (last 3)  No results for input(s): GLUCAP in the last 72 hours.  Assessment/Plan: S/P Procedure(s) (LRB): right VIDEO ASSISTED THORACOSCOPY (VATS) for drainage of EMPYEMA and decortication (Right) VIDEO BRONCHOSCOPY (N/A) -  Minimal serous drainage from CT with no air leak- dc chest tube CXR shows persistent RLL consolidation/ pleura thickening right base- will take time to clear Cultures growing strep viridans- antibiotics per medical  team Ambulate SCD for DVT prophylaxis. Ok to add enoxaparin at this point   LOS: 5 days    Loreli SlotSteven C Dev Dhondt 07/15/2017

## 2017-07-15 NOTE — Progress Notes (Signed)
PROGRESS NOTE    ANNASTON UPHAM  ZOX:[id number] DOB: 16-Jul-1950 DOA: 07/10/2017 PCP: Patient, No Pcp Per   Brief Narrative:  HPI On 07/10/2017 by Dr. Lorretta Harp LORRAIN RIVERS is a 67 y.o. female with medical history significant of tobacco and alcohol abuse, hypertension, who presents with shortness breath, cough, chest pain and generalized weakness.  Patient states that she has been having dry cough and SOB in the past 4 days, which has been progressively getting worse. She has right sided chest pain, which is constant, sharp, 10 out of 10 in severity, radiating to the right flank area . It is pleuritic, aggravated by deep breath. No hemoptysis. No tenderness in calf areas. Patient does not have fever or chills. Patient denies nausea, vomiting, diarrhea, abdominal pain, symptoms of UTI or unilateral weakness. Pt was seen by PCP today. This is her first visit with this provider. CXR was taken and she was called back by PCP, instructed to come to ED due to "fluid in lung". Patient reports smoking a pack a day for 48years and drinks vodka every daily. Pt can barely speak in full sentence.  Interim history Admitted with hypoxia and respiratory failure, found to have empyema. PCCM and CTS surgery consulted. Underwent bronch, VATS.   Assessment & Plan   Acute respiratory failure with hypoxia/right pleural effusion-Empyema  -CTA was negative for pulmonary embolism however showed large right-sided pleural effusion, right lower lobe bronchus obstruction and possible lobar pneumonia -Continue supplemental oxygen to maintain saturations above 92% -Influenza PCR negative -Interventional radiology consulted and appreciated for ultrasound-guided thoracentesis-which yielded 320 mL of yellow, cloudy fluid -Pulmonology consulted for possible bronchoscopy given loculations noted on CT scan as well as possible bronchus obstruction -Pleural fluid LDH 1193, WBC 10K, Glucose <20 --> consistent with  empyema -culture from thoracentesis shows no growth -Cardiothoracic surgery consulted and appreciated, s/p bronchoscopy, right VATS, drainage of empyema and decortication, chest tube placement. Cultures +moderate viridans streptococcus - pending susceptibilities  -Urine strep pneumonia antigen negative -Continue nebulizer treatments and guaifenesin -Patient initially placed on azithromycin and ceftriaxone however transitioned to Zosyn and azithromycin -Continue incentive spirometry- discussed with patient -Added flutter valve -management of chest tube per cardiothoracic surgery- one tube removed on 07/14/2017, plan for 2nd tube removal today  Nonsustained SVT -The evening of 07/14/2017, patient developed SVT with rates of 180.  Attempted to use vagal maneuvers, unsuccessful.  Patient given 1 dose of IV metoprolol 5 mg.  Converted back to sinus rhythm with heart rate in the 80s. -Patient did feel fluttering.  Blood pressure was stable. -Echocardiogram obtained -Magnesium 1.6, will replace -Potassium 3.3 will replace -continue tele monitoring   Sepsis secondary to UTI/pneumonia/Empyema -Patient presented with leukocytosis, tachycardia and tachypnea- all improved -UA: Many bacteria, TNTC WBC, moderate leukocytes -Possible pneumonia noted on CTA -Continue azithromycin and zosyn -Blood cultures no growth to date -Urine culture multiple species  Acute kidney injury -Resolved, Suspect secondary to dehydration and sepsis -Upon admission, Creatinine 1.09, baseline truly unknown, as last creatinine was 0.5 in 2013 -Creatinine currently 0.4 -Continue to monitor BMP  Elevated troponin -Patient presented with pleuritic type chest pain, however does have pleural effusion and likely pneumonia/empyema -Suspect troponin due to demand ischemia and sepsis -Cardiology was contacted by the ED and spoke with Dr. Harlow Mares.  Recommended to continue cycling troponin, did not feel this is ACS related. -Plan  currently cycle, improving down 0.55 to <0.03 -Echocardiogram EF 60-65%, grade 1 diastolic dysfunction, aortic valve sclerosis -Continue statin, aspirin added -  LDL 38  Hypokalemia/ hypomagnesemia -Will replace and continue to monitor  Essential hypertension -Given sepsis, will hold amlodipine -Continue to monitor closely, hydralazine as needed -Her blood pressure has been stable without medications  Tobacco/alcohol abuse -Discussed cessation -Continue CIWA protocol and nicotine patch  Macrocytic anemia -likely due to recent surgery and dilutional component -hemoglobin currently 11.8 -will continue to monitor CBC  DVT Prophylaxis  SCDs--> lovenox  Code Status: Full  Family Communication: None at bedside  Disposition Plan: Admitted. Dispo TBD  Consultants Interventional radiology PCCM Cardiothoracic surgery  Procedures  Right US guided thoracentesis  Echocardiogram Bronchoscopy, right video-assisted thoracic drainage of empyema, visceral and parietal pleural decortication  Antibiotics   Anti-infectives (From admission, onward)   Start     Dose/Rate Route Frequency Ordered Stop   07/12/17 0800  vancomycin (VANCOCIN) IVPB 1000 mg/200 mL premix     1,000 mg 200 mL/hr over 60 Minutes Intravenous To Surgery 07/11/17 1944 07/12/17 1050   07/11/17 2000  azithromycin (ZITHROMAX) 500 mg in sodium chloride 0.9 % 250 mL IVPB     500 mg 250 mL/hr over 60 Minutes Intravenous Every 24 hours 07/10/17 2054 07/18/17 1959   07/11/17 1800  cefTRIAXone (ROCEPHIN) 1 g in sodium chloride 0.9 % 100 mL IVPB  Status:  Discontinued     1 g 200 mL/hr over 30 Minutes Intravenous Every 24 hours 07/10/17 2054 07/11/17 1512   07/11/17 1600  piperacillin-tazobactam (ZOSYN) IVPB 3.375 g     3.375 g 12.5 mL/hr over 240 Minutes Intravenous Every 8 hours 07/11/17 1550     07/10/17 1815  cefTRIAXone (ROCEPHIN) 1 g in sodium chloride 0.9 % 100 mL IVPB     1 g 200 mL/hr over 30 Minutes Intravenous   Once 07/10/17 1812 07/10/17 1912   07/10/17 1815  azithromycin (ZITHROMAX) 500 mg in sodium chloride 0.9 % 250 mL IVPB     500 mg 250 mL/hr over 60 Minutes Intravenous  Once 07/10/17 1812 07/10/17 2209      Subjective:   Jessaca Philippi seen and examined today.  Patient states she is feeling better today.  To use to have cough, productive.  Feels better after coughing up sputum.  Feels breathing is improving.  Denies current chest pain, domino pain, nausea vomiting, diarrhea or constipation.  Objective:   Vitals:   07/14/17 2343 07/15/17 0517 07/15/17 0738 07/15/17 0811  BP: (!) 141/74 138/76  140/78  Pulse: 75 85  87  Resp: 19 20  13   Temp: 98.7 F (37.1 C) 99 F (37.2 C)  98.4 F (36.9 C)  TempSrc: Oral Oral  Oral  SpO2: 94% 98% 96% 97%  Weight:      Height:        Intake/Output Summary (Last 24 hours) at 07/15/2017 1146 Last data filed at 07/15/2017 1610 Gross per 24 hour  Intake 3283.17 ml  Output 2400 ml  Net 883.17 ml   Filed Weights   07/11/17 0600 07/12/17 0500 07/12/17 2019  Weight: 70.5 kg (155 lb 8 oz) 71.8 kg (158 lb 3.2 oz) 74.9 kg (165 lb 2 oz)   Exam  General: Well developed, well nourished, NAD, appears stated age  HEENT: NCAT, mucous membranes moist.   Neck: Supple  Cardiovascular: S1 S2 auscultated, RRR, no murmur  Respiratory: Diminished lung sounds in the right base, improving  Abdomen: Soft, nontender, nondistended, + bowel sounds  Extremities: warm dry without cyanosis clubbing or edema  Neuro: AAOx3, nonfocal  Psych: Normal affect and demeanor with  intact judgement and insight  Data Reviewed: I have personally reviewed following labs and imaging studies  CBC: Recent Labs  Lab 07/10/17 1625 07/11/17 2120 07/12/17 0637 07/13/17 0306 07/14/17 0424 07/15/17 0601  WBC 21.3* 13.0* 11.1* 14.3* 10.1 9.3  NEUTROABS 19.4*  --   --   --   --   --   HGB 15.8* 14.1 12.1 11.6* 10.9* 11.8*  HCT 45.3 42.0 36.6 34.5* 32.6* 35.3*  MCV  103.4* 104.5* 104.6* 103.0* 104.8* 103.8*  PLT 314 344 320 332 315 385   Basic Metabolic Panel: Recent Labs  Lab 07/11/17 2120 07/12/17 0637 07/13/17 0306 07/14/17 0424 07/15/17 0601  NA 132* 131* 131* 134* 134*  K 3.6 3.3* 3.5 3.1* 3.3*  CL 95* 95* 95* 100* 97*  CO2 26 25 25 27 27   GLUCOSE 118* 84 201* 118* 86  BUN 7 <5* <5* 6 <5*  CREATININE 0.67 0.54 0.48 0.58 0.40*  CALCIUM 8.0* 7.5* 7.4* 7.4* 7.9*  MG  --   --   --  1.6*  --    GFR: Estimated Creatinine Clearance: 70.4 mL/min (A) (by C-G formula based on SCr of 0.4 mg/dL (L)). Liver Function Tests: Recent Labs  Lab 07/10/17 1625 07/11/17 2120 07/14/17 0424  AST 12* 19 44*  ALT 7* 11* 27  ALKPHOS 78 116 77  BILITOT 0.7 0.7 0.3  PROT 6.7 5.4* 4.3*  ALBUMIN 3.0* 2.5* 1.7*   No results for input(s): LIPASE, AMYLASE in the last 168 hours. No results for input(s): AMMONIA in the last 168 hours. Coagulation Profile: Recent Labs  Lab 07/10/17 2110 07/11/17 2120  INR 1.07 1.13   Cardiac Enzymes: Recent Labs  Lab 07/10/17 1736 07/10/17 2005 07/11/17 0704 07/11/17 1406  TROPONINI 0.49* 0.55* 0.12* <0.03   BNP (last 3 results) No results for input(s): PROBNP in the last 8760 hours. HbA1C: No results for input(s): HGBA1C in the last 72 hours. CBG: No results for input(s): GLUCAP in the last 168 hours. Lipid Profile: No results for input(s): CHOL, HDL, LDLCALC, TRIG, CHOLHDL, LDLDIRECT in the last 72 hours. Thyroid Function Tests: No results for input(s): TSH, T4TOTAL, FREET4, T3FREE, THYROIDAB in the last 72 hours. Anemia Panel: No results for input(s): VITAMINB12, FOLATE, FERRITIN, TIBC, IRON, RETICCTPCT in the last 72 hours. Urine analysis:    Component Value Date/Time   COLORURINE YELLOW 07/11/2017 2148   APPEARANCEUR HAZY (A) 07/11/2017 2148   LABSPEC 1.006 07/11/2017 2148   PHURINE 6.0 07/11/2017 2148   GLUCOSEU NEGATIVE 07/11/2017 2148   HGBUR MODERATE (A) 07/11/2017 2148   BILIRUBINUR NEGATIVE  07/11/2017 2148   BILIRUBINUR neg 10/11/2011 1425   KETONESUR 5 (A) 07/11/2017 2148   PROTEINUR NEGATIVE 07/11/2017 2148   UROBILINOGEN 0.2 10/11/2011 1425   UROBILINOGEN 0.2 01/24/2008 2217   NITRITE NEGATIVE 07/11/2017 2148   LEUKOCYTESUR LARGE (A) 07/11/2017 2148   Sepsis Labs: @LABRCNTIP (procalcitonin:4,lacticidven:4)  ) Recent Results (from the past 240 hour(s))  Blood Culture (routine x 2)     Status: None (Preliminary result)   Collection Time: 07/10/17  4:34 PM  Result Value Ref Range Status   Specimen Description BLOOD RIGHT ANTECUBITAL  Final   Special Requests   Final    BOTTLES DRAWN AEROBIC AND ANAEROBIC Blood Culture adequate volume   Culture   Final    NO GROWTH 4 DAYS Performed at [facility], [address] [place], [place] [address]    Report Status PENDING  Incomplete  Blood Culture (routine x 2)  Status: None (Preliminary result)   Collection Time: 07/10/17  4:34 PM  Result Value Ref Range Status   Specimen Description BLOOD RIGHT ANTECUBITAL  Final   Special Requests IN PEDIATRIC BOTTLE Blood Culture adequate volume  Final   Culture   Final    NO GROWTH 4 DAYS Performed at [facility], [address], [place], [place] [address]    Report Status PENDING  Incomplete  Urine culture     Status: Abnormal   Collection Time: 07/10/17  6:39 PM  Result Value Ref Range Status   Specimen Description URINE, RANDOM  Final   Special Requests   Final    NONE Performed at [facility], [address] [place], [place] [address]    Culture MULTIPLE SPECIES PRESENT, SUGGEST RECOLLECTION (A)  Final   Report Status 07/12/2017 FINAL  Final  Culture, sputum-assessment     Status: None   Collection Time: 07/11/17 11:54 AM  Result Value Ref Range Status   Specimen Description EXPECTORATED SPUTUM  Final   Special Requests NONE  Final   Sputum evaluation   Final    Sputum specimen not acceptable for testing.  Please recollect.   Gram Stain Report  Called to,Read Back By and Verified With: RN Gildardo Griffes [phone] MLM Performed at [facility], [address] [place], [place] [address]    Report Status 07/11/2017 FINAL  Final  Gram stain     Status: None   Collection Time: 07/11/17  1:28 PM  Result Value Ref Range Status   Specimen Description PLEURAL  Final   Special Requests RIGHT  Final   Gram Stain   Final    ABUNDANT WBC PRESENT, PREDOMINANTLY PMN NO ORGANISMS SEEN Performed at [facility], [address], [place], [place] [address]    Report Status 07/11/2017 FINAL  Final  Fungus Culture With Stain     Status: None (Preliminary result)   Collection Time: 07/11/17  1:28 PM  Result Value Ref Range Status   Fungus Stain Final report  Final    Comment: (NOTE) Performed At: [facility] [address] [place], [place] [id number] Jolene Schimke MD [id number]    Fungus (Mycology) Culture PENDING  Incomplete   Fungal Source FLUID  Final    Comment: PLEURAL  Culture, body fluid-bottle     Status: None (Preliminary result)   Collection Time: 07/11/17  1:28 PM  Result Value Ref Range Status   Specimen Description PLEURAL RIGHT  Final   Special Requests FLUID  Final   Culture   Final    NO GROWTH 3 DAYS Performed at [facility], [address] [place], [place] [address]    Report Status PENDING  Incomplete  Fungus Culture Result     Status: None   Collection Time: 07/11/17  1:28 PM  Result Value Ref Range Status   Result 1 Comment  Final    Comment: (NOTE) KOH/Calcofluor preparation:  no fungus observed. Performed At: [facility] [address] [place], [place] [id number] Jolene Schimke MD [id number]   Surgical PCR screen     Status: None   Collection Time: 07/12/17  8:06 AM  Result Value Ref Range Status   MRSA, PCR NEGATIVE NEGATIVE Final   Staphylococcus aureus NEGATIVE NEGATIVE Final    Comment: (NOTE) The Xpert SA Assay (FDA approved for NASAL specimens in patients  49 years of age and older), is one component of a comprehensive surveillance  program. It is not intended to diagnose infection nor to guide or monitor treatment. Performed at [facility], [address] [place], [place] [address]   Anaerobic culture     Status: None (Preliminary result)   Collection Time: 07/12/17 10:48 AM  Result Value Ref Range Status   Specimen Description BRONCHIAL WASHINGS RIGHT  Final   Special Requests SPEC A  Final   Culture   Final    HOLDING FOR POSSIBLE ANAEROBE Performed at [facility], [address] [place], [place] [address]    Report Status PENDING  Incomplete  Culture, respiratory (NON-Expectorated)     Status: None   Collection Time: 07/12/17 10:48 AM  Result Value Ref Range Status   Specimen Description BRONCHIAL WASHINGS RIGHT  Final   Special Requests SPEC A  Final   Gram Stain   Final    RARE WBC PRESENT, PREDOMINANTLY PMN NO ORGANISMS SEEN    Culture   Final    RARE Consistent with normal respiratory flora. Performed at [facility], [address] [place], [place] [address]    Report Status 07/14/2017 FINAL  Final  Body fluid culture     Status: None (Preliminary result)   Collection Time: 07/12/17 11:14 AM  Result Value Ref Range Status   Specimen Description PLEURAL RIGHT  Final   Special Requests SPEC C  Final   Gram Stain NO WBC SEEN NO ORGANISMS SEEN   Final   Culture   Final    NO GROWTH 2 DAYS Performed at [facility], [address] [place], [place] [address]    Report Status PENDING  Incomplete  Aerobic/Anaerobic Culture (surgical/deep wound)     Status: None (Preliminary result)   Collection Time: 07/12/17 11:18 AM  Result Value Ref Range Status   Specimen Description TISSUE RIGHT PLEURAL PEEL  Final   Special Requests SPEC D  Final   Gram Stain   Final    MODERATE WBC PRESENT, PREDOMINANTLY PMN NO ORGANISMS SEEN Performed at [facility], [address] [place], [place]  [address]    Culture   Final    FEW VIRIDANS STREPTOCOCCUS CULTURE REINCUBATED FOR BETTER GROWTH CONSISTENT WITH PREVIOUS RESULT NO ANAEROBES ISOLATED; CULTURE IN PROGRESS FOR 5 DAYS    Report Status PENDING  Incomplete  Aerobic/Anaerobic Culture (surgical/deep wound)     Status: None (Preliminary result)   Collection Time: 07/12/17 11:49 AM  Result Value Ref Range Status   Specimen Description LUNG LEFT LOWE LOBE  Final   Special Requests SWABSPEC E  Final   Gram Stain   Final    MODERATE WBC PRESENT,BOTH PMN AND MONONUCLEAR FEW GRAM POSITIVE COCCI IN PAIRS AND CHAINS RARE GRAM POSITIVE RODS Performed at [facility], [address] [place], [place] [address]    Culture   Final    MODERATE VIRIDANS STREPTOCOCCUS RARE STAPHYLOCOCCUS SPECIES (COAGULASE NEGATIVE) SUSCEPTIBILITIES TO FOLLOW FOR VIRIDANS NO ANAEROBES ISOLATED; CULTURE IN PROGRESS FOR 5 DAYS    Report Status PENDING  Incomplete   Organism ID, Bacteria STAPHYLOCOCCUS SPECIES (COAGULASE NEGATIVE)  Final      Susceptibility   Staphylococcus species (coagulase negative) - MIC*    CIPROFLOXACIN <=0.5 SENSITIVE Sensitive     ERYTHROMYCIN <=0.25 SENSITIVE Sensitive     GENTAMICIN <=0.5 SENSITIVE Sensitive     OXACILLIN <=0.25 SENSITIVE Sensitive     TETRACYCLINE 2 SENSITIVE Sensitive     VANCOMYCIN <=0.5 SENSITIVE Sensitive  TRIMETH/SULFA <=10 SENSITIVE Sensitive     CLINDAMYCIN <=0.25 SENSITIVE Sensitive     RIFAMPIN <=0.5 SENSITIVE Sensitive     Inducible Clindamycin NEGATIVE Sensitive     * RARE STAPHYLOCOCCUS SPECIES (COAGULASE NEGATIVE)  MRSA PCR Screening     Status: None   Collection Time: 07/12/17  9:03 PM  Result Value Ref Range Status   MRSA by PCR NEGATIVE NEGATIVE Final    Comment:        The GeneXpert MRSA Assay (FDA approved for NASAL specimens only), is one component of a comprehensive MRSA colonization surveillance program. It is not intended to diagnose MRSA infection nor to guide  or monitor treatment for MRSA infections. Performed at [facility], [address], [place], [place] [address]       Radiology Studies: Dg Chest Port 1 View  Result Date: 07/15/2017 CLINICAL DATA:  Status post VATS procedure. Status post chest tube removal. EXAM: PORTABLE CHEST 1 VIEW COMPARISON:  July 14, 2017 FINDINGS: One of the chest tubes on the right has been removed. A second chest tube remains unchanged in position. No pneumothorax. Central catheter tip is in the superior vena cava. There is a right pleural effusion with consolidation in the right base, stable. Left lung is clear except for mild left base atelectasis. No new opacity. Heart size and pulmonary vascularity are normal. There is aortic atherosclerosis. No adenopathy. No bone lesions. IMPRESSION: Tube and catheter positions as described without pneumothorax. Persistent right pleural effusion with right base consolidation. Mild left base atelectasis. No new opacity. Stable cardiac silhouette. There is aortic atherosclerosis. Aortic Atherosclerosis (ICD10-I70.0). Electronically Signed   By: Bretta Bang III M.D.   On: 07/15/2017 07:48   Dg Chest Port 1 View  Result Date: 07/14/2017 CLINICAL DATA:  Empyema EXAM: PORTABLE CHEST 1 VIEW COMPARISON:  07/13/2017 FINDINGS: Right chest tube remains in place with small right apical pneumothorax, stable. Small right pleural effusion. Bibasilar atelectasis, right greater than left, stable. No acute bony abnormality. IMPRESSION: Stable small right apical pneumothorax. Bibasilar atelectasis and small right effusion, stable. Electronically Signed   By: Charlett Nose M.D.   On: 07/14/2017 07:43   Dg Chest Port 1 View  Result Date: 07/13/2017 CLINICAL DATA:  Emphysema EXAM: PORTABLE CHEST 1 VIEW COMPARISON:  07/12/2017; 07/11/2017; 07/10/2017; chest CT - 07/10/2017 FINDINGS: Grossly unchanged cardiac silhouette and mediastinal contours with atherosclerotic plaque within the thoracic  aorta. Stable positioning of support apparatus. Suspected tiny right apical pneumothorax. Small to moderate size right-sided effusion with associated right basilar heterogeneous/consolidative opacities, unchanged. Suspected trace left-sided effusion. Mild pulmonary venous congestion without frank evidence of edema. No new focal airspace opacities. No acute osseus abnormalities. IMPRESSION: 1. Stable positioning of support apparatus with development of suspected tiny right apical pneumothorax. Continued attention on follow-up is advised. 2. Grossly unchanged small to moderate-sized right-sided effusion with associated right basilar opacities, atelectasis versus infiltrate. Electronically Signed   By: Simonne Come M.D.   On: 07/13/2017 14:03     Scheduled Meds: . acetaminophen  1,000 mg Oral Q6H   Or  . acetaminophen (TYLENOL) oral liquid 160 mg/5 mL  1,000 mg Oral Q6H  . aspirin  325 mg Oral Daily  . atorvastatin  40 mg Oral q1800  . bisacodyl  10 mg Oral Daily  . enoxaparin (LOVENOX) injection  40 mg Subcutaneous Q24H  . fentaNYL   Intravenous Q4H  . folic acid  1 mg Oral Daily  . levalbuterol  0.63 mg Nebulization TID  .  mouth rinse  15 mL Mouth Rinse BID  . multivitamin with minerals  1 tablet Oral Daily  . nicotine  21 mg Transdermal Daily  . potassium chloride  40 mEq Oral BID  . senna-docusate  1 tablet Oral QHS  . thiamine  100 mg Oral Daily   Or  . thiamine  100 mg Intravenous Daily   Continuous Infusions: . sodium chloride Stopped (07/12/17 2226)  . azithromycin Stopped (07/14/17 2039)  . dextrose 5 % and 0.9% NaCl 10 mL/hr at 07/15/17 0814  . lactated ringers    . piperacillin-tazobactam (ZOSYN)  IV 3.375 g (07/15/17 0903)     LOS: 5 days   Time Spent in minutes  45 minutes  Jaquaveon Bilal D.O. on 07/15/2017 at 11:46 AM  Between 7am to 7pm - Pager - [phone]  After 7pm go to [email or web address] - password [email or web address]  And look for the night coverage person covering for me  after hours  [facility] Office  [phone]

## 2017-07-15 NOTE — Progress Notes (Signed)
Instructed pt in use of flutter valve q1hw/a. Pt tolerated well and able to teach back.

## 2017-07-15 NOTE — Progress Notes (Signed)
Pt visiting with family wants to wait to go for walk later.

## 2017-07-15 NOTE — Progress Notes (Signed)
Echocardiogram 2D Echocardiogram has been performed.  Pieter PartridgeBrooke S Iann Rodier 07/15/2017, 2:30 PM

## 2017-07-15 NOTE — Discharge Instructions (Signed)
Thoracoscopy, Care After °Refer to this sheet in the next few weeks. These instructions provide you with information about caring for yourself after your procedure. Your health care provider may also give you more specific instructions. Your treatment has been planned according to current medical practices, but problems sometimes occur. Call your health care provider if you have any problems or questions after your procedure. °What can I expect after the procedure? °After your procedure, it is common to feel sore for up to two weeks. °Follow these instructions at home: °· There are many different ways to close and cover an incision, including stitches (sutures), skin glue, and adhesive strips. Follow your health care provider's instructions about: °? Incision care. °? Bandage (dressing) changes and removal. °? Incision closure removal. °· Check your incision area every day for signs of infection. Watch for: °? Redness, swelling, or pain. °? Fluid, blood, or pus. °· Take medicines only as directed by your health care provider. °· Try to cough often. Coughing helps to protect against lung infection (pneumonia). It may hurt to cough. If this happens, hold a pillow against your chest when you cough. °· Take deep breaths. This also helps to protect against pneumonia. °· If you were given an incentive spirometer, use it as directed by your health care provider. °· Do not take baths, swim, or use a hot tub until your health care provider approves. You may take showers. °· Avoid lifting until your health care provider approves. °· Avoid driving until your health care provider approves. °· Do not travel by airplane after the chest tube is removed until your health care provider approves. °Contact a health care provider if: °· You have a fever. °· Pain medicines do not ease your pain. °· You have redness, swelling, or increasing pain in your incision area. °· You develop a cough that does not go away, or you are coughing up  mucus that is yellow or green. °Get help right away if: °· You have fluid, blood, or pus coming from your incision. °· There is a bad smell coming from your incision or dressing. °· You develop a rash. °· You have difficulty breathing. °· You cough up blood. °· You develop light-headedness or you feel faint. °· You develop chest pain. °· Your heartbeat feels irregular or very fast. °This information is not intended to replace advice given to you by your health care provider. Make sure you discuss any questions you have with your health care provider. °Document Released: 11/10/2004 Document Revised: 12/25/2015 Document Reviewed: 01/06/2014 °[employer] Interactive Patient Education © 2018 [employer] ° °

## 2017-07-16 ENCOUNTER — Inpatient Hospital Stay (HOSPITAL_COMMUNITY): Payer: BC Managed Care – PPO

## 2017-07-16 LAB — BASIC METABOLIC PANEL
Anion gap: 8 (ref 5–15)
CHLORIDE: 99 mmol/L — AB (ref 101–111)
CO2: 28 mmol/L (ref 22–32)
CREATININE: 0.48 mg/dL (ref 0.44–1.00)
Calcium: 8 mg/dL — ABNORMAL LOW (ref 8.9–10.3)
GFR calc Af Amer: >60 mL/min (ref >60)
GFR calc non Af Amer: >60 mL/min (ref >60)
GLUCOSE: 90 mg/dL (ref 65–99)
POTASSIUM: 4.4 mmol/L (ref 3.5–5.1)
Sodium: 135 mmol/L (ref 135–145)

## 2017-07-16 LAB — CBC
HEMATOCRIT: 35.1 % — AB (ref 36.0–46.0)
HEMOGLOBIN: 11.4 g/dL — AB (ref 12.0–15.0)
MCH: 33.9 pg (ref 26.0–34.0)
MCHC: 32.5 g/dL (ref 30.0–36.0)
MCV: 104.5 fL — AB (ref 78.0–100.0)
Platelets: 449 10*3/uL — ABNORMAL HIGH (ref 150–400)
RBC: 3.36 MIL/uL — AB (ref 3.87–5.11)
RDW: 13.1 % (ref 11.5–15.5)
WBC: 9.3 10*3/uL (ref 4.0–10.5)

## 2017-07-16 LAB — ANAEROBIC CULTURE

## 2017-07-16 LAB — MAGNESIUM: MAGNESIUM: 1.8 mg/dL (ref 1.7–2.4)

## 2017-07-16 LAB — CULTURE, BODY FLUID W GRAM STAIN -BOTTLE: Culture: NO GROWTH

## 2017-07-16 LAB — ECHOCARDIOGRAM LIMITED
Height: 66.5 in
Weight: 2641.99 oz

## 2017-07-16 NOTE — Progress Notes (Signed)
Patient ambulated with nurse around unit with walker. Total time 10 minutes. Before ambulation on rest 02 sat 91-93% on room air. After ambulation 02 sat 88% on room air. Patient tolerated well. No shortness of breath noted. As patient ambulated, patient was talking and did not appear to have difficulty breathing. Patient's recovery after walking and resting in bed for after was 93-94%.

## 2017-07-16 NOTE — Progress Notes (Addendum)
PROGRESS NOTE    Erica Ball  WJX:[id number] DOB: 03-04-1951 DOA: 07/10/2017 PCP: Patient, No Pcp Per   Brief Narrative:  HPI On 07/10/2017 by Dr. Lorretta Harp Erica Ball is a 67 y.o. female with medical history significant of tobacco and alcohol abuse, hypertension, who presents with shortness breath, cough, chest pain and generalized weakness.  Patient states that she has been having dry cough and SOB in the past 4 days, which has been progressively getting worse. She has right sided chest pain, which is constant, sharp, 10 out of 10 in severity, radiating to the right flank area . It is pleuritic, aggravated by deep breath. No hemoptysis. No tenderness in calf areas. Patient does not have fever or chills. Patient denies nausea, vomiting, diarrhea, abdominal pain, symptoms of UTI or unilateral weakness. Pt was seen by PCP today. This is her first visit with this provider. CXR was taken and she was called back by PCP, instructed to come to ED due to "fluid in lung". Patient reports smoking a pack a day for 48years and drinks vodka every daily. Pt can barely speak in full sentence.  Interim history Admitted with hypoxia and respiratory failure, found to have empyema. PCCM and CTS surgery consulted. Underwent bronch, VATS. Chest tubes have now been removed, patient currently improving slowly.   Assessment & Plan   Acute respiratory failure with hypoxia/right pleural effusion-Empyema  -CTA was negative for pulmonary embolism however showed large right-sided pleural effusion, right lower lobe bronchus obstruction and possible lobar pneumonia -Continue supplemental oxygen to maintain saturations above 92% -Influenza PCR negative -Interventional radiology consulted and appreciated for ultrasound-guided thoracentesis-which yielded 320 mL of yellow, cloudy fluid -Pulmonology consulted for possible bronchoscopy given loculations noted on CT scan as well as possible bronchus  obstruction -Pleural fluid LDH 1193, WBC 10K, Glucose <20 --> consistent with empyema -culture from thoracentesis shows no growth -Cardiothoracic surgery consulted and appreciated, s/p bronchoscopy, right VATS, drainage of empyema and decortication, chest tube placement. Cultures +moderate viridans streptococcus - pending susceptibilities  -Urine strep pneumonia antigen negative -Continue nebulizer treatments and guaifenesin, incentive spirometry and flutter valve -Initially placed on azithromycin and ceftriaxone however then transitioned to Zosyn, and have de-escalated to ceftriaxone -Chest tubes have been removed by cardiothoracic surgery -Chest x-ray this morning reviewed and shows persistent consolidation in the right lung base -Patient will likely need a prolonged course of antibiotics for lung abscess and empyema -She will also need to follow-up with cardiothoracic surgery upon discharge -Have discontinued PCA pump in place patient on oxycodone 5 mg every 4 hours as needed -will order monitoring of O2 saturations at rest and with ambulation  Nonsustained SVT -The evening of 07/14/2017, patient developed SVT with rates of 180.  Attempted to use vagal maneuvers, unsuccessful.  Patient given 1 dose of IV metoprolol 5 mg.  Converted back to sinus rhythm with heart rate in the 80s. -Patient did feel fluttering.  Blood pressure was stable. -Limited Echocardiogram obtained: EF 60-65%, wall motion was normal -Magnesium 1.8, will given additional IV dose -Potassium 4.4 after replacement -continue tele monitoring   Sepsis secondary to UTI/pneumonia/Empyema -Patient presented with leukocytosis, tachycardia and tachypnea- all improved -UA: Many bacteria, TNTC WBC, moderate leukocytes -Possible pneumonia noted on CTA -Continue zosyn -Blood cultures no growth to date -Urine culture multiple species  Acute kidney injury -Resolved, Suspect secondary to dehydration and sepsis -Upon admission,  Creatinine 1.09, baseline truly unknown, as last creatinine was 0.5 in 2013 -Creatinine currently 0.48 -Continue to monitor  BMP  Elevated troponin -Patient presented with pleuritic type chest pain, however does have pleural effusion and likely pneumonia/empyema -Suspect troponin due to demand ischemia and sepsis -Cardiology was contacted by the ED and spoke with Dr. Harlow Mares.  Recommended to continue cycling troponin, did not feel this is ACS related. -Plan currently cycle, improving down 0.55 to <0.03 -Echocardiogram EF 60-65%, grade 1 diastolic dysfunction, aortic valve sclerosis -Continue statin, aspirin added -LDL 38  Hypokalemia/ hypomagnesemia -Hypokalemia resolved, continue to monitor BMP -Magnesium currently 1.8 however will give additional dose today for goal of 2  Essential hypertension -Given sepsis, amlodipine held -Continue to monitor closely, hydralazine as needed -Her blood pressure has been stable without medications  Tobacco/alcohol abuse -Discussed cessation -Continue CIWA protocol and nicotine patch  Macrocytic anemia -likely due to recent surgery and dilutional component -hemoglobin currently 11.4 -Continue to monitor CBC  DVT Prophylaxis  SCDs--> lovenox  Code Status: Full  Family Communication: None at bedside  Disposition Plan: Admitted. Will change level of care to telemetry. Dispo likely to home within the next 1-2 days  Consultants Interventional radiology PCCM Cardiothoracic surgery  Procedures  Right US guided thoracentesis  Echocardiogram Bronchoscopy, right video-assisted thoracic drainage of empyema, visceral and parietal pleural decortication  Antibiotics   Anti-infectives (From admission, onward)   Start     Dose/Rate Route Frequency Ordered Stop   07/15/17 1800  cefTRIAXone (ROCEPHIN) 2 g in sodium chloride 0.9 % 100 mL IVPB     2 g 200 mL/hr over 30 Minutes Intravenous Every 24 hours 07/15/17 1305     07/12/17 0800  vancomycin  (VANCOCIN) IVPB 1000 mg/200 mL premix     1,000 mg 200 mL/hr over 60 Minutes Intravenous To Surgery 07/11/17 1944 07/12/17 1050   07/11/17 2000  azithromycin (ZITHROMAX) 500 mg in sodium chloride 0.9 % 250 mL IVPB  Status:  Discontinued     500 mg 250 mL/hr over 60 Minutes Intravenous Every 24 hours 07/10/17 2054 07/15/17 1305   07/11/17 1800  cefTRIAXone (ROCEPHIN) 1 g in sodium chloride 0.9 % 100 mL IVPB  Status:  Discontinued     1 g 200 mL/hr over 30 Minutes Intravenous Every 24 hours 07/10/17 2054 07/11/17 1512   07/11/17 1600  piperacillin-tazobactam (ZOSYN) IVPB 3.375 g  Status:  Discontinued     3.375 g 12.5 mL/hr over 240 Minutes Intravenous Every 8 hours 07/11/17 1550 07/15/17 1305   07/10/17 1815  cefTRIAXone (ROCEPHIN) 1 g in sodium chloride 0.9 % 100 mL IVPB     1 g 200 mL/hr over 30 Minutes Intravenous  Once 07/10/17 1812 07/10/17 1912   07/10/17 1815  azithromycin (ZITHROMAX) 500 mg in sodium chloride 0.9 % 250 mL IVPB     500 mg 250 mL/hr over 60 Minutes Intravenous  Once 07/10/17 1812 07/10/17 2209      Subjective:   Erica Ball seen and examined today.  Patient states she is feeling better today. Continues to have complain of pain after having the chest tube pulled this morning. Denis current chest pain, abdominal pain, N/V/D/C. Is able to cough up more sputum.   Objective:   Vitals:   07/16/17 0419 07/16/17 0732 07/16/17 0811 07/16/17 1027  BP: (!) 141/76  140/75   Pulse: 81     Resp: Temp: 98.7 F (37.1 C)  98.2 F (36.8 C)   TempSrc: Oral  Oral   SpO2: 97% 93%  93%  Weight:      Height:  Intake/Output Summary (Last 24 hours) at 07/16/2017 1106 Last data filed at 07/16/2017 0643 Gross per 24 hour  Intake 249.5 ml  Output 1300 ml  Net -1050.5 ml   Filed Weights   07/11/17 0600 07/12/17 0500 07/12/17 2019  Weight: 70.5 kg (155 lb 8 oz) 71.8 kg (158 lb 3.2 oz) 74.9 kg (165 lb 2 oz)   Exam  General: Well developed, well  nourished, NAD, appears stated age  HEENT: NCAT, mucous membranes moist.   Neck: Supple  Cardiovascular: S1 S2 auscultated, RRR, no murmur  Respiratory: Mildly diminished breath sounds R base, however improved. Otherwise clear.  Abdomen: Soft, nontender, nondistended, + bowel sounds  Extremities: warm dry without cyanosis clubbing or edema  Neuro: AAOx3, nofocal  Psych: Appropriate mood and affect  Data Reviewed: I have personally reviewed following labs and imaging studies  CBC: Recent Labs  Lab 07/10/17 1625  07/12/17 0637 07/13/17 0306 07/14/17 0424 07/15/17 0601 07/16/17 0527  WBC 21.3*   < > 11.1* 14.3* 10.1 9.3 9.3  NEUTROABS 19.4*  --   --   --   --   --   --   HGB 15.8*   < > 12.1 11.6* 10.9* 11.8* 11.4*  HCT 45.3   < > 36.6 34.5* 32.6* 35.3* 35.1*  MCV 103.4*   < > 104.6* 103.0* 104.8* 103.8* 104.5*  PLT 314   < > 320 332 315 385 449*   < > = values in this interval not displayed.   Basic Metabolic Panel: Recent Labs  Lab 07/12/17 0637 07/13/17 0306 07/14/17 0424 07/15/17 0601 07/16/17 0527  NA 131* 131* 134* 134* 135  K 3.3* 3.5 3.1* 3.3* 4.4  CL 95* 95* 100* 97* 99*  CO2 GLUCOSE 84 201* 118* 86 90  BUN <5* <5* 6 <5* <5*  CREATININE 0.54 0.48 0.58 0.40* 0.48  CALCIUM 7.5* 7.4* 7.4* 7.9* 8.0*  MG  --   --  1.6*  --  1.8   GFR: Estimated Creatinine Clearance: 70.4 mL/min (by C-G formula based on SCr of 0.48 mg/dL). Liver Function Tests: Recent Labs  Lab 07/10/17 1625 07/11/17 2120 07/14/17 0424  AST 12* 19 44*  ALT 7* 11* 27  ALKPHOS 78 116 77  BILITOT 0.7 0.7 0.3  PROT 6.7 5.4* 4.3*  ALBUMIN 3.0* 2.5* 1.7*   No results for input(s): LIPASE, AMYLASE in the last 168 hours. No results for input(s): AMMONIA in the last 168 hours. Coagulation Profile: Recent Labs  Lab 07/10/17 2110 07/11/17 2120  INR 1.07 1.13   Cardiac Enzymes: Recent Labs  Lab 07/10/17 1736 07/10/17 2005 07/11/17 0704 07/11/17 1406  TROPONINI  0.49* 0.55* 0.12* <0.03   BNP (last 3 results) No results for input(s): PROBNP in the last 8760 hours. HbA1C: No results for input(s): HGBA1C in the last 72 hours. CBG: No results for input(s): GLUCAP in the last 168 hours. Lipid Profile: No results for input(s): CHOL, HDL, LDLCALC, TRIG, CHOLHDL, LDLDIRECT in the last 72 hours. Thyroid Function Tests: No results for input(s): TSH, T4TOTAL, FREET4, T3FREE, THYROIDAB in the last 72 hours. Anemia Panel: No results for input(s): VITAMINB12, FOLATE, FERRITIN, TIBC, IRON, RETICCTPCT in the last 72 hours. Urine analysis:    Component Value Date/Time   COLORURINE YELLOW 07/11/2017 2148   APPEARANCEUR HAZY (A) 07/11/2017 2148   LABSPEC 1.006 07/11/2017 2148   PHURINE 6.0 07/11/2017 2148   GLUCOSEU NEGATIVE 07/11/2017 2148   HGBUR MODERATE (A) 07/11/2017 2148  BILIRUBINUR NEGATIVE 07/11/2017 2148   BILIRUBINUR neg 10/11/2011 1425   KETONESUR 5 (A) 07/11/2017 2148   PROTEINUR NEGATIVE 07/11/2017 2148   UROBILINOGEN 0.2 10/11/2011 1425   UROBILINOGEN 0.2 01/24/2008 2217   NITRITE NEGATIVE 07/11/2017 2148   LEUKOCYTESUR LARGE (A) 07/11/2017 2148   Sepsis Labs: @LABRCNTIP (procalcitonin:4,lacticidven:4)  ) Recent Results (from the past 240 hour(s))  Blood Culture (routine x 2)     Status: None   Collection Time: 07/10/17  4:34 PM  Result Value Ref Range Status   Specimen Description BLOOD RIGHT ANTECUBITAL  Final   Special Requests   Final    BOTTLES DRAWN AEROBIC AND ANAEROBIC Blood Culture adequate volume   Culture   Final    NO GROWTH 5 DAYS Performed at [facility], [address], [place], [place] [address]    Report Status 07/15/2017 FINAL  Final  Blood Culture (routine x 2)     Status: None   Collection Time: 07/10/17  4:34 PM  Result Value Ref Range Status   Specimen Description BLOOD RIGHT ANTECUBITAL  Final   Special Requests IN PEDIATRIC BOTTLE Blood Culture adequate volume  Final   Culture   Final    NO  GROWTH 5 DAYS Performed at [facility], [address], [place], [place] [address]    Report Status 07/15/2017 FINAL  Final  Urine culture     Status: Abnormal   Collection Time: 07/10/17  6:39 PM  Result Value Ref Range Status   Specimen Description URINE, RANDOM  Final   Special Requests   Final    NONE Performed at [facility], [address], [place], [place] [address]    Culture MULTIPLE SPECIES PRESENT, SUGGEST RECOLLECTION (A)  Final   Report Status 07/12/2017 FINAL  Final  Culture, sputum-assessment     Status: None   Collection Time: 07/11/17 11:54 AM  Result Value Ref Range Status   Specimen Description EXPECTORATED SPUTUM  Final   Special Requests NONE  Final   Sputum evaluation   Final    Sputum specimen not acceptable for testing.  Please recollect.   Gram Stain Report Called to,Read Back By and Verified With: RN Gildardo Griffes [phone] MLM Performed at [facility], [address] [place], [place] [address]    Report Status 07/11/2017 FINAL  Final  Gram stain     Status: None   Collection Time: 07/11/17  1:28 PM  Result Value Ref Range Status   Specimen Description PLEURAL  Final   Special Requests RIGHT  Final   Gram Stain   Final    ABUNDANT WBC PRESENT, PREDOMINANTLY PMN NO ORGANISMS SEEN Performed at [facility], [address], [place], [place] [address]    Report Status 07/11/2017 FINAL  Final  Fungus Culture With Stain     Status: None (Preliminary result)   Collection Time: 07/11/17  1:28 PM  Result Value Ref Range Status   Fungus Stain Final report  Final    Comment: (NOTE) Performed At: [facility] [address] [place], [place] [id number] Jolene Schimke MD NU:[id number]    Fungus (Mycology) Culture PENDING  Incomplete   Fungal Source FLUID  Final    Comment: PLEURAL  Culture, body fluid-bottle     Status: None (Preliminary result)   Collection Time: 07/11/17  1:28 PM  Result Value Ref Range Status    Specimen Description PLEURAL RIGHT  Final   Special Requests FLUID  Final   Culture   Final    NO GROWTH 4 DAYS Performed at [facility], [address] [place], [place] [address]    Report Status PENDING  Incomplete  Fungus Culture Result     Status: None   Collection Time: 07/11/17  1:28 PM  Result Value Ref Range Status   Result 1 Comment  Final    Comment: (NOTE) KOH/Calcofluor preparation:  no fungus observed. Performed At: [facility] [address] [place], [place] [id number] Jolene Schimke MD GN:[id number]   Surgical PCR screen     Status: None   Collection Time: 07/12/17  8:06 AM  Result Value Ref Range Status   MRSA, PCR NEGATIVE NEGATIVE Final   Staphylococcus aureus NEGATIVE NEGATIVE Final    Comment: (NOTE) The Xpert SA Assay (FDA approved for NASAL specimens in patients 32 years of age and older), is one component of a comprehensive surveillance program. It is not intended to diagnose infection nor to guide or monitor treatment. Performed at [facility], [address] [place], [place] [address]   Anaerobic culture     Status: None (Preliminary result)   Collection Time: 07/12/17 10:48 AM  Result Value Ref Range Status   Specimen Description BRONCHIAL WASHINGS RIGHT  Final   Special Requests SPEC A  Final   Culture   Final    NO ANAEROBES ISOLATED; CULTURE IN PROGRESS FOR 5 DAYS   Report Status PENDING  Incomplete  Culture, respiratory (NON-Expectorated)     Status: None   Collection Time: 07/12/17 10:48 AM  Result Value Ref Range Status   Specimen Description BRONCHIAL WASHINGS RIGHT  Final   Special Requests SPEC A  Final   Gram Stain   Final    RARE WBC PRESENT, PREDOMINANTLY PMN NO ORGANISMS SEEN    Culture   Final    RARE Consistent with normal respiratory flora. Performed at [facility], [address], [place], [place] [address]    Report Status 07/14/2017 FINAL  Final  Body fluid culture     Status: None    Collection Time: 07/12/17 11:14 AM  Result Value Ref Range Status   Specimen Description PLEURAL RIGHT  Final   Special Requests SPEC C  Final   Gram Stain NO WBC SEEN NO ORGANISMS SEEN   Final   Culture   Final    NO GROWTH 3 DAYS Performed at [facility], [address], [place], [place] [address]    Report Status 07/15/2017 FINAL  Final  Aerobic/Anaerobic Culture (surgical/deep wound)     Status: None (Preliminary result)   Collection Time: 07/12/17 11:18 AM  Result Value Ref Range Status   Specimen Description TISSUE RIGHT PLEURAL PEEL  Final   Special Requests SPEC D  Final   Gram Stain   Final    MODERATE WBC PRESENT, PREDOMINANTLY PMN NO ORGANISMS SEEN Performed at [facility], [address] [place], [place] [address]    Culture   Final    FEW VIRIDANS STREPTOCOCCUS CONSISTENT WITH PREVIOUS RESULT NO ANAEROBES ISOLATED; CULTURE IN PROGRESS FOR 5 DAYS    Report Status PENDING  Incomplete  Aerobic/Anaerobic Culture (surgical/deep wound)     Status: None (Preliminary result)   Collection Time: 07/12/17 11:49 AM  Result Value Ref Range Status   Specimen Description LUNG LEFT LOWE LOBE  Final   Special Requests SWABSPEC E  Final   Gram Stain   Final  MODERATE WBC PRESENT,BOTH PMN AND MONONUCLEAR FEW GRAM POSITIVE COCCI IN PAIRS AND CHAINS RARE GRAM POSITIVE RODS Performed at [facility], [address], [place], [place] [address]    Culture   Final    MODERATE VIRIDANS STREPTOCOCCUS RARE STAPHYLOCOCCUS SPECIES (COAGULASE NEGATIVE) SUSCEPTIBILITIES TO FOLLOW FOR VIRIDANS NO ANAEROBES ISOLATED; CULTURE IN PROGRESS FOR 5 DAYS    Report Status PENDING  Incomplete   Organism ID, Bacteria STAPHYLOCOCCUS SPECIES (COAGULASE NEGATIVE)  Final      Susceptibility   Staphylococcus species (coagulase negative) - MIC*    CIPROFLOXACIN <=0.5 SENSITIVE Sensitive     ERYTHROMYCIN <=0.25 SENSITIVE Sensitive     GENTAMICIN <=0.5 SENSITIVE Sensitive      OXACILLIN <=0.25 SENSITIVE Sensitive     TETRACYCLINE 2 SENSITIVE Sensitive     VANCOMYCIN <=0.5 SENSITIVE Sensitive     TRIMETH/SULFA <=10 SENSITIVE Sensitive     CLINDAMYCIN <=0.25 SENSITIVE Sensitive     RIFAMPIN <=0.5 SENSITIVE Sensitive     Inducible Clindamycin NEGATIVE Sensitive     * RARE STAPHYLOCOCCUS SPECIES (COAGULASE NEGATIVE)  MRSA PCR Screening     Status: None   Collection Time: 07/12/17  9:03 PM  Result Value Ref Range Status   MRSA by PCR NEGATIVE NEGATIVE Final    Comment:        The GeneXpert MRSA Assay (FDA approved for NASAL specimens only), is one component of a comprehensive MRSA colonization surveillance program. It is not intended to diagnose MRSA infection nor to guide or monitor treatment for MRSA infections. Performed at [facility], [address] [place], [place] [address]       Radiology Studies: Dg Chest 2 View  Result Date: 07/16/2017 CLINICAL DATA:  Empyema EXAM: CHEST - 2 VIEW COMPARISON:  Chest radiograph from one day prior. FINDINGS: Right subclavian central venous catheter terminates in upper superior vena cava, unchanged. Interval removal of right chest tube. Stable cardiomediastinal silhouette with normal heart size. No pneumothorax. Small right basilar pleural effusion with associated new tiny focus of pleural gas, stable in size. No left pleural effusion. No pulmonary edema. Stable patchy right lung base opacity. IMPRESSION: 1. Small basilar right hydropneumothorax status post right chest tube removal, stable in size. 2. Patchy right lung base opacity is stable. Electronically Signed   By: Delbert Phenix M.D.   On: 07/16/2017 09:29   Dg Chest Port 1 View  Result Date: 07/15/2017 CLINICAL DATA:  Status post VATS procedure. Status post chest tube removal. EXAM: PORTABLE CHEST 1 VIEW COMPARISON:  July 14, 2017 FINDINGS: One of the chest tubes on the right has been removed. A second chest tube remains unchanged in position. No  pneumothorax. Central catheter tip is in the superior vena cava. There is a right pleural effusion with consolidation in the right base, stable. Left lung is clear except for mild left base atelectasis. No new opacity. Heart size and pulmonary vascularity are normal. There is aortic atherosclerosis. No adenopathy. No bone lesions. IMPRESSION: Tube and catheter positions as described without pneumothorax. Persistent right pleural effusion with right base consolidation. Mild left base atelectasis. No new opacity. Stable cardiac silhouette. There is aortic atherosclerosis. Aortic Atherosclerosis (ICD10-I70.0). Electronically Signed   By: Bretta Bang III M.D.   On: 07/15/2017 07:48     Scheduled Meds: . acetaminophen  1,000 mg Oral Q6H   Or  . acetaminophen (TYLENOL) oral liquid 160 mg/5 mL  1,000 mg Oral Q6H  . aspirin  325 mg Oral Daily  .  atorvastatin  40 mg Oral q1800  . bisacodyl  10 mg Oral Daily  . enoxaparin (LOVENOX) injection  40 mg Subcutaneous Q24H  . folic acid  1 mg Oral Daily  . levalbuterol  0.63 mg Nebulization BID  . mouth rinse  15 mL Mouth Rinse BID  . multivitamin with minerals  1 tablet Oral Daily  . senna-docusate  1 tablet Oral QHS  . sodium chloride flush  10-40 mL Intracatheter Q12H  . thiamine  100 mg Oral Daily   Or  . thiamine  100 mg Intravenous Daily   Continuous Infusions: . sodium chloride Stopped (07/12/17 2226)  . cefTRIAXone (ROCEPHIN)  IV Stopped (07/15/17 1821)  . dextrose 5 % and 0.9% NaCl 10 mL/hr at 07/15/17 0814  . lactated ringers       LOS: 6 days   Time Spent in minutes  45 minutes  Alphus Zeck D.O. on 07/16/2017 at 11:06 AM  Between 7am to 7pm - Pager - [phone]  After 7pm go to [email or web address] - password [email or web address]  And look for the night coverage person covering for me after hours  [facility] Office  [phone]

## 2017-07-16 NOTE — Care Management Note (Signed)
Case Management Note  Patient Details  Name: Erica Ball MRN: [id number] Date of Birth: 01/03/1951  Subjective/Objective:         Pt admitted with empyema - Ball/p VATS with CT insertion          Action/Plan:  PTA independent from home alone.  Pt informed CM that she no longer has PCP - CM offered [facility] information and also informed pt that she can solicit help in locating a local PCP from current insurance company.  Pt denied barriers with obtaining/paying for medications as prescribed   Expected Discharge Date:                  Expected Discharge Plan:  Home/Self Care  In-House Referral:     Discharge planning Services  CM Consult  Post Acute Care Choice:    Choice offered to:     DME Arranged:    DME Agency:     HH Arranged:    HH Agency:     Status of Service:  In process, will continue to follow  If discussed at Long Length of Stay Meetings, dates discussed:    Additional Comments:  Erica Ball, Erica Worst S, RN 07/16/2017, 2:40 PM

## 2017-07-16 NOTE — Progress Notes (Addendum)
      [address]       [place] [id number]             [phone]      4 Days Post-Op Procedure(s) (LRB): right VIDEO ASSISTED THORACOSCOPY (VATS) for drainage of EMPYEMA and decortication (Right) VIDEO BRONCHOSCOPY (N/A) Subjective: Still having some incisional soreness. She is a little worried if she is sent home today.   Objective: Vital signs in last 24 hours: Temp:  [98.2 F (36.8 C)-98.9 F (37.2 C)] 98.2 F (36.8 C) (03/12 0811) Pulse Rate:  [81-92] 81 (03/12 0419) Cardiac Rhythm: Normal sinus rhythm (03/12 0700) Resp:  [12-20] 15 (03/12 0811) BP: (130-141)/(69-76) 140/75 (03/12 0811) SpO2:  [92 %-97 %] 93 % (03/12 0732)     Intake/Output from previous day: 03/11 0701 - 03/12 0700 In: 581 [I.V.:381; IV Piggyback:200] Out: 2240 [Urine:2200; Chest Tube:40] Intake/Output this shift: No intake/output data recorded.  General appearance: alert, cooperative and no distress Heart: regular rate and rhythm, S1, S2 normal, no murmur, click, rub or gallop Lungs: clear to auscultation bilaterally Abdomen: soft, non-tender; bowel sounds normal; no masses,  no organomegaly Extremities: extremities normal, atraumatic, no cyanosis or edema Wound: clean and dry  Lab Results: Recent Labs    07/15/17 0601 07/16/17 0527  WBC 9.3 9.3  HGB 11.8* 11.4*  HCT 35.3* 35.1*  PLT 385 449*   BMET:  Recent Labs    07/15/17 0601 07/16/17 0527  NA 134* 135  K 3.3* 4.4  CL 97* 99*  CO2 27 28  GLUCOSE 86 90  BUN <5* <5*  CREATININE 0.40* 0.48  CALCIUM 7.9* 8.0*    PT/INR: No results for input(s): LABPROT, INR in the last 72 hours. ABG    Component Value Date/Time   PHART 7.412 07/13/2017 0210   HCO3 28.0 07/13/2017 0210   O2SAT 96.0 07/13/2017 0210   CBG (last 3)  No results for input(s): GLUCAP in the last 72 hours.  Assessment/Plan: S/P Procedure(s) (LRB): right VIDEO ASSISTED THORACOSCOPY (VATS) for drainage of EMPYEMA and decortication  (Right) VIDEO BRONCHOSCOPY (N/A)  1. CV- NSR rate is in the 80s, BP has been well controlled 2. Pulm- CXR is stable compared to yesterdays study. Chest tube has been removed.  3. Renal-creatinine 0.48, electrolytes okay 4. H and H has remained stable.  5. SCD for DVT prophylaxis. Ok to add enoxaparin at this point 6. ID- cultures grew strep viridans- antibiotics per med team.  Follow-up appointments have been made.     LOS: 6 days    Sharlene Doryessa N Conte 07/16/2017 Patient seen and examined, agree with above CXR looks good this AM, still has some persistent consolidation at right base, likely some pleural thickening as well Needs prolonged course of antibiotics with lung abscess/ empyema Currently on Rocephin, may be able to change to PO No surgical issues at this time  Viviann SpareSteven C. Dorris FetchHendrickson, MD [facility] [phone]

## 2017-07-17 LAB — AEROBIC/ANAEROBIC CULTURE W GRAM STAIN (SURGICAL/DEEP WOUND)

## 2017-07-17 LAB — AEROBIC/ANAEROBIC CULTURE (SURGICAL/DEEP WOUND)

## 2017-07-17 LAB — BASIC METABOLIC PANEL
Anion gap: 8 (ref 5–15)
CALCIUM: 8.2 mg/dL — AB (ref 8.9–10.3)
CHLORIDE: 98 mmol/L — AB (ref 101–111)
CO2: 28 mmol/L (ref 22–32)
CREATININE: 0.44 mg/dL (ref 0.44–1.00)
GFR calc non Af Amer: >60 mL/min (ref >60)
Glucose, Bld: 92 mg/dL (ref 65–99)
Potassium: 4.4 mmol/L (ref 3.5–5.1)
SODIUM: 134 mmol/L — AB (ref 135–145)

## 2017-07-17 LAB — CBC
HCT: 32.8 % — ABNORMAL LOW (ref 36.0–46.0)
HEMOGLOBIN: 10.7 g/dL — AB (ref 12.0–15.0)
MCH: 34.4 pg — AB (ref 26.0–34.0)
MCHC: 32.6 g/dL (ref 30.0–36.0)
MCV: 105.5 fL — ABNORMAL HIGH (ref 78.0–100.0)
Platelets: 466 10*3/uL — ABNORMAL HIGH (ref 150–400)
RBC: 3.11 MIL/uL — ABNORMAL LOW (ref 3.87–5.11)
RDW: 13.6 % (ref 11.5–15.5)
WBC: 8.3 10*3/uL (ref 4.0–10.5)

## 2017-07-17 NOTE — Progress Notes (Signed)
PROGRESS NOTE    [name]  NWG:[id number] DOB: [birth year] DOA: 07/10/2017 PCP: Patient, No Pcp Per   Brief Narrative:  HPI On 07/10/2017 by Dr. Lorretta Harp Erica Ball is a 67 y.o. female with medical history significant of tobacco and alcohol abuse, hypertension, who presents with shortness breath, cough, chest pain and generalized weakness.  Patient states that she has been having dry cough and SOB in the past 4 days, which has been progressively getting worse. She has right sided chest pain, which is constant, sharp, 10 out of 10 in severity, radiating to the right flank area . It is pleuritic, aggravated by deep breath. No hemoptysis. No tenderness in calf areas. Patient does not have fever or chills. Patient denies nausea, vomiting, diarrhea, abdominal pain, symptoms of UTI or unilateral weakness. Pt was seen by PCP today. This is her first visit with this provider. CXR was taken and she was called back by PCP, instructed to come to ED due to "fluid in lung". Patient reports smoking a pack a day for 48years and drinks vodka every daily. Pt can barely speak in full sentence.  Interim history Admitted with hypoxia and respiratory failure, found to have empyema. PCCM and CTS surgery consulted. Underwent bronch, VATS. Chest tubes have now been removed, patient currently improving slowly.   Assessment & Plan   Acute respiratory failure with hypoxia/right pleural effusion-Empyema  -CTA was negative for pulmonary embolism however showed large right-sided pleural effusion, right lower lobe bronchus obstruction and possible lobar pneumonia -Continue supplemental oxygen to maintain saturations above 92% -Influenza PCR negative -Interventional radiology consulted and appreciated for ultrasound-guided thoracentesis-which yielded 320 mL of yellow, cloudy fluid -Pulmonology consulted for possible bronchoscopy given loculations noted on CT scan as well as possible bronchus  obstruction -Pleural fluid LDH 1193, WBC 10K, Glucose <20 --> consistent with empyema -culture from thoracentesis shows no growth -Cardiothoracic surgery consulted and appreciated, s/p bronchoscopy, right VATS, drainage of empyema and decortication, chest tube placement. Cultures +moderate viridans streptococcus - pending susceptibilities  -Urine strep pneumonia antigen negative -Continue nebulizer treatments and guaifenesin, incentive spirometry and flutter valve -Initially placed on azithromycin and ceftriaxone however then transitioned to Zosyn, and have de-escalated to ceftriaxone -Chest tubes have been removed by cardiothoracic surgery -Chest x-ray this morning reviewed and shows persistent consolidation in the right lung base -Patient will likely need a prolonged course of antibiotics for lung abscess and empyema -She will also need to follow-up with cardiothoracic surgery upon discharge - continue current oral pain medication regimen. Should patient have increased discomfort with activity will plan on titrating pain medication regimen.  Nonsustained SVT -The evening of 07/14/2017, patient developed SVT with rates of 180.  Attempted to use vagal maneuvers, unsuccessful.  Patient given 1 dose of IV metoprolol 5 mg.  Converted back to sinus rhythm with heart rate in the 80s. -Patient did feel fluttering.  Blood pressure was stable. -Limited Echocardiogram obtained: EF 60-65%, wall motion was normal -Potassium 4.4 after replacement -Non reported to me today by staff. Pt denies any palpitations. K is normal and magnesium on last check wnl. But reportedly was replenished. Will reassess next am.  Sepsis secondary to UTI/pneumonia/Empyema -Patient presented with leukocytosis, tachycardia and tachypnea- all improved -UA: Many bacteria, TNTC WBC, moderate leukocytes -Possible pneumonia noted on CTA -Continue zosyn -Blood cultures no growth to date -Urine culture multiple species  Acute kidney  injury -Resolved, Suspect secondary to dehydration and sepsis -Upon admission, Creatinine 1.09, baseline truly unknown, as last  creatinine was 0.5 in 2013 -Creatinine currently 0.48 -Continue to monitor BMP  Elevated troponin -Patient presented with pleuritic type chest pain, however does have pleural effusion and likely pneumonia/empyema -Suspect troponin due to demand ischemia and sepsis -Cardiology was contacted by the ED and spoke with Dr. Harlow Mares.  Recommended to continue cycling troponin, did not feel this is ACS related. -Plan currently cycle, improving down 0.55 to <0.03 -Echocardiogram EF 60-65%, grade 1 diastolic dysfunction, aortic valve sclerosis -Continue statin, aspirin added -LDL 38  Hypokalemia/ hypomagnesemia -Hypokalemia resolved, continue to monitor BMP -Magnesium currently 1.8 however will give additional dose today for goal of 2 (not repeated as such)  Essential hypertension -Given sepsis, amlodipine held -Continue to monitor closely, hydralazine as needed -Her blood pressure has been stable without medications  Tobacco/alcohol abuse -Discussed cessation -Continue CIWA protocol and nicotine patch  Macrocytic anemia -likely due to recent surgery and dilutional component -hemoglobin currently 11.4 -Continue to monitor CBC  DVT Prophylaxis  SCDs--> lovenox  Code Status: Full  Family Communication: None at bedside  Disposition Plan: with improved pain control will plan on d/c  Consultants Interventional radiology PCCM Cardiothoracic surgery  Procedures  Right US guided thoracentesis  Echocardiogram Bronchoscopy, right video-assisted thoracic drainage of empyema, visceral and parietal pleural decortication  Antibiotics   Anti-infectives (From admission, onward)   Start     Dose/Rate Route Frequency Ordered Stop   07/15/17 1800  cefTRIAXone (ROCEPHIN) 2 g in sodium chloride 0.9 % 100 mL IVPB     2 g 200 mL/hr over 30 Minutes Intravenous Every 24  hours 07/15/17 1305     07/12/17 0800  vancomycin (VANCOCIN) IVPB 1000 mg/200 mL premix     1,000 mg 200 mL/hr over 60 Minutes Intravenous To Surgery 07/11/17 1944 07/12/17 1050   07/11/17 2000  azithromycin (ZITHROMAX) 500 mg in sodium chloride 0.9 % 250 mL IVPB  Status:  Discontinued     500 mg 250 mL/hr over 60 Minutes Intravenous Every 24 hours 07/10/17 2054 07/15/17 1305   07/11/17 1800  cefTRIAXone (ROCEPHIN) 1 g in sodium chloride 0.9 % 100 mL IVPB  Status:  Discontinued     1 g 200 mL/hr over 30 Minutes Intravenous Every 24 hours 07/10/17 2054 07/11/17 1512   07/11/17 1600  piperacillin-tazobactam (ZOSYN) IVPB 3.375 g  Status:  Discontinued     3.375 g 12.5 mL/hr over 240 Minutes Intravenous Every 8 hours 07/11/17 1550 07/15/17 1305   07/10/17 1815  cefTRIAXone (ROCEPHIN) 1 g in sodium chloride 0.9 % 100 mL IVPB     1 g 200 mL/hr over 30 Minutes Intravenous  Once 07/10/17 1812 07/10/17 1912   07/10/17 1815  azithromycin (ZITHROMAX) 500 mg in sodium chloride 0.9 % 250 mL IVPB     500 mg 250 mL/hr over 60 Minutes Intravenous  Once 07/10/17 1812 07/10/17 2209      Subjective:   Erica Ball pt has no new complaints, no acute issues overnight.  Objective:   Vitals:   07/17/17 0445 07/17/17 0754 07/17/17 0837 07/17/17 1242  BP: 127/76  (!) 156/79 134/78  Pulse: 84  93 (!) 152  Resp: 16  15 (!) 21  Temp: 98.8 F (37.1 C)  99.2 F (37.3 C) 98.8 F (37.1 C)  TempSrc: Oral  Oral Axillary  SpO2: 92% 94% 93% 92%  Weight:      Height:        Intake/Output Summary (Last 24 hours) at 07/17/2017 1421 Last data filed at 07/17/2017 1610 Gross  per 24 hour  Intake 980 ml  Output 900 ml  Net 80 ml   Filed Weights   07/11/17 0600 07/12/17 0500 07/12/17 2019  Weight: 70.5 kg (155 lb 8 oz) 71.8 kg (158 lb 3.2 oz) 74.9 kg (165 lb 2 oz)   Exam  General: Pt in nad, alert and awake  HEENT: NCAT, mucous membranes moist.   Neck: Supple, no goiter  Cardiovascular: S1 S2  auscultated, RRR, no murmur  Respiratory: Mildly diminished breath sounds R base, however improved. Otherwise clear.  Abdomen: Soft, nontender, nondistended, + bowel sounds  Extremities: warm dry without cyanosis clubbing or edema  Neuro: AAOx3, nofocal  Psych: Appropriate mood and affect  Data Reviewed: I have personally reviewed following labs and imaging studies  CBC: Recent Labs  Lab 07/10/17 1625  07/13/17 0306 07/14/17 0424 07/15/17 0601 07/16/17 0527 07/17/17 0400  WBC 21.3*   < > 14.3* 10.1 9.3 9.3 8.3  NEUTROABS 19.4*  --   --   --   --   --   --   HGB 15.8*   < > 11.6* 10.9* 11.8* 11.4* 10.7*  HCT 45.3   < > 34.5* 32.6* 35.3* 35.1* 32.8*  MCV 103.4*   < > 103.0* 104.8* 103.8* 104.5* 105.5*  PLT 314   < > 332 315 385 449* 466*   < > = values in this interval not displayed.   Basic Metabolic Panel: Recent Labs  Lab 07/13/17 0306 07/14/17 0424 07/15/17 0601 07/16/17 0527 07/17/17 0400  NA 131* 134* 134* 135 134*  K 3.5 3.1* 3.3* 4.4 4.4  CL 95* 100* 97* 99* 98*  CO2 25 27 27 28 28   GLUCOSE 201* 118* 86 90 92  BUN <5* 6 <5* <5* <5*  CREATININE 0.48 0.58 0.40* 0.48 0.44  CALCIUM 7.4* 7.4* 7.9* 8.0* 8.2*  MG  --  1.6*  --  1.8  --    GFR: Estimated Creatinine Clearance: 70.4 mL/min (by C-G formula based on SCr of 0.44 mg/dL). Liver Function Tests: Recent Labs  Lab 07/10/17 1625 07/11/17 2120 07/14/17 0424  AST 12* 19 44*  ALT 7* 11* 27  ALKPHOS 78 116 77  BILITOT 0.7 0.7 0.3  PROT 6.7 5.4* 4.3*  ALBUMIN 3.0* 2.5* 1.7*   No results for input(s): LIPASE, AMYLASE in the last 168 hours. No results for input(s): AMMONIA in the last 168 hours. Coagulation Profile: Recent Labs  Lab 07/10/17 2110 07/11/17 2120  INR 1.07 1.13   Cardiac Enzymes: Recent Labs  Lab 07/10/17 1736 07/10/17 2005 07/11/17 0704 07/11/17 1406  TROPONINI 0.49* 0.55* 0.12* <0.03   BNP (last 3 results) No results for input(s): PROBNP in the last 8760 hours. HbA1C: No  results for input(s): HGBA1C in the last 72 hours. CBG: No results for input(s): GLUCAP in the last 168 hours. Lipid Profile: No results for input(s): CHOL, HDL, LDLCALC, TRIG, CHOLHDL, LDLDIRECT in the last 72 hours. Thyroid Function Tests: No results for input(s): TSH, T4TOTAL, FREET4, T3FREE, THYROIDAB in the last 72 hours. Anemia Panel: No results for input(s): VITAMINB12, FOLATE, FERRITIN, TIBC, IRON, RETICCTPCT in the last 72 hours. Urine analysis:    Component Value Date/Time   COLORURINE YELLOW 07/11/2017 2148   APPEARANCEUR HAZY (A) 07/11/2017 2148   LABSPEC 1.006 07/11/2017 2148   PHURINE 6.0 07/11/2017 2148   GLUCOSEU NEGATIVE 07/11/2017 2148   HGBUR MODERATE (A) 07/11/2017 2148   BILIRUBINUR NEGATIVE 07/11/2017 2148   BILIRUBINUR neg 10/11/2011 1425   KETONESUR 5 (  A) 07/11/2017 2148   PROTEINUR NEGATIVE 07/11/2017 2148   UROBILINOGEN 0.2 10/11/2011 1425   UROBILINOGEN 0.2 01/24/2008 2217   NITRITE NEGATIVE 07/11/2017 2148   LEUKOCYTESUR LARGE (A) 07/11/2017 2148   Sepsis Labs: @LABRCNTIP (procalcitonin:4,lacticidven:4)  ) Recent Results (from the past 240 hour(s))  Blood Culture (routine x 2)     Status: None   Collection Time: 07/10/17  4:34 PM  Result Value Ref Range Status   Specimen Description BLOOD RIGHT ANTECUBITAL  Final   Special Requests   Final    BOTTLES DRAWN AEROBIC AND ANAEROBIC Blood Culture adequate volume   Culture   Final    NO GROWTH 5 DAYS Performed at [facility], [address] [place], [place] [address]    Report Status 07/15/2017 FINAL  Final  Blood Culture (routine x 2)     Status: None   Collection Time: 07/10/17  4:34 PM  Result Value Ref Range Status   Specimen Description BLOOD RIGHT ANTECUBITAL  Final   Special Requests IN PEDIATRIC BOTTLE Blood Culture adequate volume  Final   Culture   Final    NO GROWTH 5 DAYS Performed at [facility], [address] [place], [place] [address]    Report Status 07/15/2017  FINAL  Final  Urine culture     Status: Abnormal   Collection Time: 07/10/17  6:39 PM  Result Value Ref Range Status   Specimen Description URINE, RANDOM  Final   Special Requests   Final    NONE Performed at [facility], [address] [place], [place] [address]    Culture MULTIPLE SPECIES PRESENT, SUGGEST RECOLLECTION (A)  Final   Report Status 07/12/2017 FINAL  Final  Culture, sputum-assessment     Status: None   Collection Time: 07/11/17 11:54 AM  Result Value Ref Range Status   Specimen Description EXPECTORATED SPUTUM  Final   Special Requests NONE  Final   Sputum evaluation   Final    Sputum specimen not acceptable for testing.  Please recollect.   Gram Stain Report Called to,Read Back By and Verified With: RN Gildardo Griffes [phone] MLM Performed at [facility], [address], [place], [place] [address]    Report Status 07/11/2017 FINAL  Final  Gram stain     Status: None   Collection Time: 07/11/17  1:28 PM  Result Value Ref Range Status   Specimen Description PLEURAL  Final   Special Requests RIGHT  Final   Gram Stain   Final    ABUNDANT WBC PRESENT, PREDOMINANTLY PMN NO ORGANISMS SEEN Performed at [facility], [address], [place], [place] [address]    Report Status 07/11/2017 FINAL  Final  Fungus Culture With Stain     Status: None (Preliminary result)   Collection Time: 07/11/17  1:28 PM  Result Value Ref Range Status   Fungus Stain Final report  Final    Comment: (NOTE) Performed At: [facility] [address] [place], [place] [id number] Jolene Schimke MD MW:[id number]    Fungus (Mycology) Culture PENDING  Incomplete   Fungal Source FLUID  Final    Comment: PLEURAL  Culture, body fluid-bottle     Status: None   Collection Time: 07/11/17  1:28 PM  Result Value Ref Range Status   Specimen Description PLEURAL RIGHT  Final   Special Requests FLUID  Final   Culture   Final    NO GROWTH 5 DAYS Performed at [facility], [address] [place], [place] [address]    Report Status 07/16/2017 FINAL  Final  Fungus Culture Result     Status: None   Collection Time: 07/11/17  1:28 PM  Result Value Ref Range Status   Result 1 Comment  Final    Comment: (NOTE) KOH/Calcofluor preparation:  no fungus observed. Performed At: [facility]  [address] [place], [place] [id number] Jolene SchimkeNagendra Sanjai MD XB:[id number]:[phone]   Surgical PCR screen     Status: None   Collection Time: 07/12/17  8:06 AM  Result Value Ref Range Status   MRSA, PCR NEGATIVE NEGATIVE Final   Staphylococcus aureus NEGATIVE NEGATIVE Final    Comment: (NOTE) The Xpert SA Assay (FDA approved for NASAL specimens in patients 69 years of age and older), is one component of a comprehensive surveillance program. It is not intended to diagnose infection nor to guide or monitor treatment. Performed at [facility], [address] [place], [place] [address]   Anaerobic culture     Status: None   Collection Time: 07/12/17 10:48 AM  Result Value Ref Range Status   Specimen Description BRONCHIAL WASHINGS RIGHT  Final   Special Requests SPEC A  Final   Culture   Final    NO ANAEROBES ISOLATED Performed at [facility], [address] [place], [place] [address]    Report Status 07/16/2017 FINAL  Final  Culture, respiratory (NON-Expectorated)     Status: None   Collection Time: 07/12/17 10:48 AM  Result Value Ref Range Status   Specimen Description BRONCHIAL WASHINGS RIGHT  Final   Special Requests SPEC A  Final   Gram Stain   Final    RARE WBC PRESENT, PREDOMINANTLY PMN NO ORGANISMS SEEN    Culture   Final    RARE Consistent with normal respiratory flora. Performed at [facility], [address] [place], [place] [address]    Report Status 07/14/2017 FINAL  Final  Body fluid culture     Status: None   Collection Time: 07/12/17 11:14 AM  Result Value Ref Range Status   Specimen Description PLEURAL RIGHT  Final    Special Requests SPEC C  Final   Gram Stain NO WBC SEEN NO ORGANISMS SEEN   Final   Culture   Final    NO GROWTH 3 DAYS Performed at [facility], [address] [place], [place] [address]    Report Status 07/15/2017 FINAL  Final  Aerobic/Anaerobic Culture (surgical/deep wound)     Status: None   Collection Time: 07/12/17 11:18 AM  Result Value Ref Range Status   Specimen Description TISSUE RIGHT PLEURAL PEEL  Final   Special Requests SPEC D  Final   Gram Stain   Final    MODERATE WBC PRESENT, PREDOMINANTLY PMN NO ORGANISMS SEEN    Culture   Final    FEW VIRIDANS STREPTOCOCCUS CONSISTENT WITH PREVIOUS RESULT NO ANAEROBES ISOLATED Performed at [facility], [address] [place], [place] [address]    Report Status 07/17/2017 FINAL  Final  Aerobic/Anaerobic Culture (surgical/deep wound)     Status: None   Collection Time: 07/12/17 11:49 AM  Result Value Ref Range Status   Specimen Description LUNG LEFT LOWE LOBE  Final   Special Requests SWABSPEC E  Final   Gram Stain   Final    MODERATE WBC PRESENT,BOTH PMN AND MONONUCLEAR FEW GRAM POSITIVE COCCI IN PAIRS AND CHAINS RARE Romie MinusGRAM  POSITIVE RODS    Culture   Final    MODERATE VIRIDANS STREPTOCOCCUS RARE STAPHYLOCOCCUS SPECIES (COAGULASE NEGATIVE) NO ANAEROBES ISOLATED Performed at [facility], [address], [place], [place] [address]    Report Status 07/17/2017 FINAL  Final   Organism ID, Bacteria STAPHYLOCOCCUS SPECIES (COAGULASE NEGATIVE)  Final   Organism ID, Bacteria VIRIDANS STREPTOCOCCUS  Final      Susceptibility   Staphylococcus species (coagulase negative) - MIC*    CIPROFLOXACIN <=0.5 SENSITIVE Sensitive     ERYTHROMYCIN <=0.25 SENSITIVE Sensitive     GENTAMICIN <=0.5 SENSITIVE Sensitive     OXACILLIN <=0.25 SENSITIVE Sensitive     TETRACYCLINE 2 SENSITIVE Sensitive     VANCOMYCIN <=0.5 SENSITIVE Sensitive     TRIMETH/SULFA <=10 SENSITIVE Sensitive     CLINDAMYCIN <=0.25 SENSITIVE  Sensitive     RIFAMPIN <=0.5 SENSITIVE Sensitive     Inducible Clindamycin NEGATIVE Sensitive     * RARE STAPHYLOCOCCUS SPECIES (COAGULASE NEGATIVE)   Viridans streptococcus - MIC*    PENICILLIN INTERMEDIATE Intermediate     CEFTRIAXONE 1 SENSITIVE Sensitive     ERYTHROMYCIN <=0.12 SENSITIVE Sensitive     LEVOFLOXACIN <=0.25 SENSITIVE Sensitive     VANCOMYCIN 0.5 SENSITIVE Sensitive     * MODERATE VIRIDANS STREPTOCOCCUS  MRSA PCR Screening     Status: None   Collection Time: 07/12/17  9:03 PM  Result Value Ref Range Status   MRSA by PCR NEGATIVE NEGATIVE Final    Comment:        The GeneXpert MRSA Assay (FDA approved for NASAL specimens only), is one component of a comprehensive MRSA colonization surveillance program. It is not intended to diagnose MRSA infection nor to guide or monitor treatment for MRSA infections. Performed at [facility], [address] [place], [place] [address]       Radiology Studies: Dg Chest 2 View  Result Date: 07/16/2017 CLINICAL DATA:  Empyema EXAM: CHEST - 2 VIEW COMPARISON:  Chest radiograph from one day prior. FINDINGS: Right subclavian central venous catheter terminates in upper superior vena cava, unchanged. Interval removal of right chest tube. Stable cardiomediastinal silhouette with normal heart size. No pneumothorax. Small right basilar pleural effusion with associated new tiny focus of pleural gas, stable in size. No left pleural effusion. No pulmonary edema. Stable patchy right lung base opacity. IMPRESSION: 1. Small basilar right hydropneumothorax status post right chest tube removal, stable in size. 2. Patchy right lung base opacity is stable. Electronically Signed   By: Delbert Phenix M.D.   On: 07/16/2017 09:29     Scheduled Meds: . acetaminophen  1,000 mg Oral Q6H   Or  . acetaminophen (TYLENOL) oral liquid 160 mg/5 mL  1,000 mg Oral Q6H  . aspirin  325 mg Oral Daily  . atorvastatin  40 mg Oral q1800  . bisacodyl  10 mg  Oral Daily  . enoxaparin (LOVENOX) injection  40 mg Subcutaneous Q24H  . folic acid  1 mg Oral Daily  . levalbuterol  0.63 mg Nebulization BID  . mouth rinse  15 mL Mouth Rinse BID  . multivitamin with minerals  1 tablet Oral Daily  . senna-docusate  1 tablet Oral QHS  . sodium chloride flush  10-40 mL Intracatheter Q12H  . thiamine  100 mg Oral Daily   Or  . thiamine  100 mg Intravenous Daily   Continuous Infusions: . sodium chloride Stopped (07/12/17 2226)  . cefTRIAXone (ROCEPHIN)  IV Stopped (07/16/17 1836)  .  dextrose 5 % and 0.9% NaCl 10 mL/hr at 07/15/17 0814  . lactated ringers       LOS: 7 days   Time Spent in minutes  45 minutes  Penny Pia D.O. on 07/17/2017 at 2:21 PM  Between 7am to 7pm - Pager - [phone]  After 7pm go to [email or web address] - password [email or web address]  And look for the night coverage person covering for me after hours  [facility] Office  [phone]

## 2017-07-17 NOTE — Progress Notes (Signed)
SpO2 91% with RA before ambulation. SpO2 down to 86-88% while ambulation, but compensated quick sitting on the chair less than 1 min, back to 91-93%. Pt might need prn home O2 when discharged from hospital. Pt also need walker while ambulation. HS [employer]Lee RN

## 2017-07-17 NOTE — Progress Notes (Signed)
5 Days Post-Op Procedure(s) (LRB): right VIDEO ASSISTED THORACOSCOPY (VATS) for drainage of EMPYEMA and decortication (Right) VIDEO BRONCHOSCOPY (N/A) Subjective: Feels better, still some soreness  Objective: Vital signs in last 24 hours: Temp:  [98.5 F (36.9 C)-98.8 F (37.1 C)] 98.8 F (37.1 C) (03/13 0445) Pulse Rate:  [77-87] 84 (03/13 0445) Cardiac Rhythm: Normal sinus rhythm (03/13 0700) Resp:  [13-24] 16 (03/13 0445) BP: (124-129)/(67-76) 127/76 (03/13 0445) SpO2:  [88 %-94 %] 94 % (03/13 0754)  Hemodynamic parameters for last 24 hours:    Intake/Output from previous day: 03/12 0701 - 03/13 0700 In: 480 [P.O.:480] Out: 900 [Urine:900] Intake/Output this shift: No intake/output data recorded.  General appearance: alert, cooperative and no distress Neurologic: intact Heart: regular rate and rhythm Lungs: better BS at right base, still a little diminished Abdomen: normal findings: soft, non-tender Wound: clean and dry  Lab Results: Recent Labs    07/16/17 0527 07/17/17 0400  WBC 9.3 8.3  HGB 11.4* 10.7*  HCT 35.1* 32.8*  PLT 449* 466*   BMET:  Recent Labs    07/16/17 0527 07/17/17 0400  NA 135 134*  K 4.4 4.4  CL 99* 98*  CO2 28 28  GLUCOSE 90 92  BUN <5* <5*  CREATININE 0.48 0.44  CALCIUM 8.0* 8.2*    PT/INR: No results for input(s): LABPROT, INR in the last 72 hours. ABG    Component Value Date/Time   PHART 7.412 07/13/2017 0210   HCO3 28.0 07/13/2017 0210   O2SAT 96.0 07/13/2017 0210   CBG (last 3)  No results for input(s): GLUCAP in the last 72 hours.  Assessment/Plan: S/P Procedure(s) (LRB): right VIDEO ASSISTED THORACOSCOPY (VATS) for drainage of EMPYEMA and decortication (Right) VIDEO BRONCHOSCOPY (N/A) -Looks good Afebrile on ceftriaxone Continue to mobilize   LOS: 7 days    Loreli SlotSteven C Bali Lyn 07/17/2017

## 2017-07-18 LAB — MAGNESIUM: Magnesium: 1.8 mg/dL (ref 1.7–2.4)

## 2017-07-18 MED ORDER — AMOXICILLIN-POT CLAVULANATE 875-125 MG PO TABS: 1.0000 | ORAL_TABLET | Freq: Two times a day (BID) | ORAL | 0 refills | Status: AC

## 2017-07-18 MED ORDER — OXYCODONE HCL 5 MG PO TABS: 5.0000 mg | ORAL_TABLET | ORAL | 0 refills | Status: DC | PRN

## 2017-07-18 MED ORDER — ASPIRIN 325 MG PO TABS: 325.0000 mg | ORAL_TABLET | Freq: Every day | ORAL | Status: DC

## 2017-07-18 NOTE — Care Management Note (Addendum)
Case Management Note  Patient Details  Name: Erica Ball MRN: [id number] Date of Birth: 03-06-1951  Subjective/Objective:         Pt admitted with empyema - s/p VATS with CT insertion          Action/Plan:  PTA independent from home alone.  Pt informed CM that she no longer has PCP - CM offered [facility] information and also informed pt that she can solicit help in locating a local PCP from current insurance company.  Pt denied barriers with obtaining/paying for medications as prescribed   Expected Discharge Date:  07/18/17               Expected Discharge Plan:  Home/Self Care  In-House Referral:     Discharge planning Services  CM Consult  Post Acute Care Choice:  Durable Medical Equipment Choice offered to:  Patient  DME Arranged:  Oxygen DME Agency:  [facility]  HH Arranged:    [facility] Agency:     Status of Service:  Completed, signed off  If discussed at Long Length of Stay Meetings, dates discussed:    Additional Comments: 07/18/2017 Pt to discharge home today - pts brother will transport pt home via private vehicle.  Pt will need home oxygen at discharge - CM offered pt choice of DME agency -  [facility] - agency contacted and referral accepted.  Pt informed CM that she already has walker in the home.  NO additional CM needs determined at the time this note was written.   Cherylann ParrClaxton, Perris Tripathi S, RN 07/18/2017, 1:36 PM

## 2017-07-18 NOTE — Discharge Summary (Signed)
Physician Discharge Summary  Erica Ball:[id number] DOB: April 27, 1951 DOA: 07/10/2017  PCP: Patient, No Pcp Per  Admit date: 07/10/2017 Discharge date: 07/18/2017  Time spent:> 35 minutes  Recommendations for Outpatient Follow-up:  1. Ensure f/u with CT surgeon   Discharge Diagnoses:  Principal Problem:   Acute respiratory failure with hypoxia (HCC) Active Problems:   Hypertension   Tobacco abuse   Elevated troponin   Pleural effusion on right   UTI (urinary tract infection)   Sepsis (HCC)   Lobar pneumonia (HCC)   Acute kidney injury (HCC)   Alcohol abuse   Discharge Condition: stable  Diet recommendation: Regular diet  Filed Weights   07/11/17 0600 07/12/17 0500 07/12/17 2019  Weight: 70.5 kg (155 lb 8 oz) 71.8 kg (158 lb 3.2 oz) 74.9 kg (165 lb 2 oz)    History of present illness:  HPI On 07/10/2017 by Dr. Fulton Ball Reevesis a 67 y.o.femalewith medical history significant oftobacco and alcohol abuse, hypertension, who presents with shortness breath, cough, chest pain and generalized weakness.  Patient states that she has been havingdrycough and SOBin the past 4 days, which has been progressively getting worse. She has right sided chest pain, which is constant, sharp, 10 out of 10 in severity, radiating to therightflank area . It ispleuritic, aggravated by deep breath.Nohemoptysis. No tenderness incalfareas. Patient does not have fever or chills. Patient denies nausea, vomiting, diarrhea, abdominal pain, symptoms of UTI or unilateral weakness. Pt was seen by PCP today. This is herfirst visit with this provider. CXR was taken and she was called back by PCP, instructed to come to ED due to "fluid in lung".Patient reports smoking a pack a day for 48yearsand drinks vodka every daily. Pt can barely speak in full sentence.  Interim history Admitted with hypoxia and respiratory failure, found to have empyema. PCCM and CTS surgery consulted.  Underwent bronch, VATS. Chest tubes have now been removed, patient currently improving slowly.    Hospital Course:  Acute respiratory failure with hypoxia/right pleural effusion-Empyema  -CTA was negative for pulmonary embolism however showed large right-sided pleural effusion, right lower lobe bronchus obstruction and possible lobar pneumonia -Continue supplemental oxygen to maintain saturations above 92% -Influenza PCR negative -Interventional radiology consulted and appreciated for ultrasound-guided thoracentesis-which yielded 320 mL of yellow, cloudy fluid -Pulmonology consulted for possible bronchoscopy given loculations noted on CT scan as well as possible bronchus obstruction -Pleural fluid LDH 1193, WBC 10K, Glucose <20 --> consistent with empyema -culture from thoracentesis shows no growth -Cardiothoracic surgery consulted and appreciated, s/p bronchoscopy, right VATS, drainage of empyema and decortication, chest tube placement. Cultures +moderate viridans streptococcus - pending susceptibilities  -Urine strep pneumonia antigen negative -Continue nebulizer treatments and guaifenesin, incentive spirometry and flutter valve -Initially placed on azithromycin and ceftriaxone however then transitioned to Zosyn, and have de-escalated to ceftriaxone -Chest tubes have been removed by cardiothoracic surgery -Chest x-ray this morning reviewed and shows persistent consolidation in the right lung base -Patient will likely need a prolonged course of antibiotics for lung abscess and empyema -She will also need to follow-up with cardiothoracic surgery upon discharge - continue current oral pain medication regimen as listed below. Also d/c on 2 weeks of augmentin  Nonsustained SVT -The evening of 07/14/2017, patient developed SVT with rates of 180.  Attempted to use vagal maneuvers, unsuccessful.  Patient given 1 dose of IV metoprolol 5 mg.  Converted back to sinus rhythm with heart rate in the  80s. -Patient did feel fluttering.  Blood pressure was stable. -Limited Echocardiogram obtained: EF 60-65%, wall motion was normal -Potassium 4.4 after replacement -Resolved, pt has no new complaints.  Sepsis secondary to UTI/pneumonia/Empyema -Patient presented with leukocytosis, tachycardia and tachypnea- all improved -UA: Many bacteria, TNTC WBC, moderate leukocytes -Possible pneumonia noted on CTA -d/c on 2 weeks of augmentin -Blood cultures no growth to date -Urine culture multiple species  Acute kidney injury -Resolved, Suspect secondary to dehydration and sepsis -Upon admission, Creatinine 1.09, baseline truly unknown, as last creatinine was 0.5 in 2013 -resolved.  Elevated troponin -Patient presented with pleuritic type chest pain, however does have pleural effusion and likely pneumonia/empyema -Suspect troponin due to demand ischemia and sepsis -Cardiology was contacted by the ED and spoke with Dr. Harlow Ball.  Recommended to continue cycling troponin, did not feel this is ACS related. -Plan currently cycle, improving down 0.55 to <0.03 -Echocardiogram EF 60-65%, grade 1 diastolic dysfunction, aortic valve sclerosis -aspirin added, pt refused statin on day of d/c -LDL 38  Hypokalemia/ hypomagnesemia -Hypokalemia resolved, continue to monitor BMP -Magnesium currently 1.8 however will give additional dose today for goal of 2 (not repeated as such)  Essential hypertension -Given sepsis, amlodipine held -Continue to monitor closely, hydralazine as needed -Her blood pressure has been stable without medications  Tobacco/alcohol abuse -Discussed cessation -Continue CIWA protocol and nicotine patch  Macrocytic anemia -likely due to recent surgery and dilutional component -hemoglobin currently 10.7    Procedures:  VATS  Consultations: CT surgery - Critical care team.  Discharge Exam: Vitals:   07/18/17 0855 07/18/17 1256  BP:    Pulse:    Resp:    Temp:   98.1 F (36.7 C)  SpO2: 93%     General: Pt in nad, alert and awake Cardiovascular: rrr, no rubs Respiratory: no increased wob, no wheezes  Discharge Instructions   Discharge Instructions    Call MD for:  difficulty breathing, headache or visual disturbances   Complete by:  As directed    Call MD for:  temperature >100.4   Complete by:  As directed    Diet - low sodium heart healthy   Complete by:  As directed    Discharge instructions   Complete by:  As directed    Please ensure follow up with your cardiothoracic surgeon   Increase activity slowly   Complete by:  As directed      Allergies as of 07/18/2017      Reactions   Dextromethorphan Other (See Comments)   Makes her very angry and she wants to hurt people      Medication List    STOP taking these medications   ALKA-SELTZER PLUS COLD PO   amLODipine 10 MG tablet Commonly known as:  NORVASC   azithromycin 250 MG tablet Commonly known as:  ZITHROMAX   BC HEADACHE PO   guaiFENesin-codeine 100-10 MG/5ML syrup     TAKE these medications   albuterol 108 (90 Base) MCG/ACT inhaler Commonly known as:  PROVENTIL HFA;VENTOLIN HFA Inhale 1-2 puffs into the lungs every 4 (four) hours as needed for shortness of breath.   amoxicillin-clavulanate 875-125 MG tablet Commonly known as:  AUGMENTIN Take 1 tablet by mouth 2 (two) times daily for 14 days.   aspirin 325 MG tablet Take 1 tablet (325 mg total) by mouth daily. Start taking on:  07/19/2017   oxyCODONE 5 MG immediate release tablet Commonly known as:  Oxy IR/ROXICODONE Take 1-2 tablets (5-10 mg total) by mouth every 4 (four) hours as needed  for severe pain.            Durable Medical Equipment  (From admission, onward)        Start     Ordered   07/18/17 1247  For home use only DME oxygen  Once    Comments:  1 liter oxygen with activity  Question Answer Comment  Mode or (Route) Nasal cannula   Liters per Minute 1   Frequency Continuous  (stationary and portable oxygen unit needed)   Oxygen conserving device No   Oxygen delivery system Gas      07/18/17 1246   07/18/17 1246  For home use only DME Walker  Once    Question:  Patient needs a walker to treat with the following condition  Answer:  Weakness   07/18/17 1246     Allergies  Allergen Reactions  . Dextromethorphan Other (See Comments)    Makes her very angry and she wants to hurt people   Follow-up Information    Loreli Slot, MD Follow up.   Specialty:  Cardiothoracic Surgery Why:  Your follow-up appointement is on 08/06/2017 at 10:15am. PLease arrive at 9:45am for a chest xray located at [facility] imaging which is on the first floor of our building.  Contact information: [address] [employer] [address] [place] [address] [phone]        nursing suture removal appointment Follow up.   Why:  Please arrive at 10:00am on 07/23/2017 for a suture removal appointment.  Contact information: Dr. Sunday Corn office       [facility] Follow up.   Contact information: Please call [phone] for assistance with locating a local Primary Care Doctor within the [facility] network           The results of significant diagnostics from this hospitalization (including imaging, microbiology, ancillary and laboratory) are listed below for reference.    Significant Diagnostic Studies: Dg Chest 1 View  Result Date: 07/11/2017 CLINICAL DATA:  Patient status post right thoracentesis. EXAM: CHEST 1 VIEW COMPARISON:  Chest radiograph 07/10/2017 FINDINGS: Monitoring leads overlie the patient. Stable cardiac and mediastinal contours. Persistent moderate right pleural effusion. Underlying consolidation right lower lung. Left basilar atelectasis. No pneumothorax. IMPRESSION: Redemonstrated moderate right pleural effusion with underlying consolidation. Electronically Signed   By: Annia Belt M.D.   On: 07/11/2017 13:39   Dg Chest 2 View  Result Date:  07/16/2017 CLINICAL DATA:  Empyema EXAM: CHEST - 2 VIEW COMPARISON:  Chest radiograph from one day prior. FINDINGS: Right subclavian central venous catheter terminates in upper superior vena cava, unchanged. Interval removal of right chest tube. Stable cardiomediastinal silhouette with normal heart size. No pneumothorax. Small right basilar pleural effusion with associated new tiny focus of pleural gas, stable in size. No left pleural effusion. No pulmonary edema. Stable patchy right lung base opacity. IMPRESSION: 1. Small basilar right hydropneumothorax status post right chest tube removal, stable in size. 2. Patchy right lung base opacity is stable. Electronically Signed   By: Delbert Phenix M.D.   On: 07/16/2017 09:29   Ct Angio Chest Pe W/cm &/or Wo Cm  Result Date: 07/10/2017 CLINICAL DATA:  67 year old female with right-sided chest pain, suspect pulmonary embolus EXAM: CT ANGIOGRAPHY CHEST WITH CONTRAST TECHNIQUE: Multidetector CT imaging of the chest was performed using the standard protocol during bolus administration of intravenous contrast. Multiplanar CT image reconstructions and MIPs were obtained to evaluate the vascular anatomy. CONTRAST:  100 mL Isovue 370 COMPARISON:  Chest x-ray obtained earlier  today; prior CT scan of the chest 10/01/2004 FINDINGS: Cardiovascular: Satisfactory opacification of the pulmonary arteries to the segmental level. No evidence of pulmonary embolism. Normal heart size. No pericardial effusion. The main pulmonary artery is enlarged at 3.3 cm. Trace aortic atherosclerotic calcifications. Calcifications are present throughout the coronary arteries. The heart is normal in size. No pericardial effusion. Mediastinum/Nodes: Unremarkable CT appearance of the thyroid gland. No suspicious mediastinal or hilar adenopathy. No soft tissue mediastinal mass. The thoracic esophagus is unremarkable. Lungs/Pleura: Large right-sided pleural effusion with associated atelectasis of the entire  right lower lobe and a portion of the inferior right middle lobe. There is a smaller loculated pleural effusion at the right lung apex. Complete occlusion of the right lower lobe bronchus with what appears to be secretions. No definite enhancing mass. Mild elevation of the left hemidiaphragm. Subsegmental left lower lobe atelectasis. Upper Abdomen: No acute abnormality. Musculoskeletal: No chest wall abnormality. No acute or significant osseous findings. Review of the MIP images confirms the above findings. IMPRESSION: 1. Large right-sided pleural effusion with complete atelectasis of the right lower lobe and partial atelectasis of the inferior right middle lobe. Of note, the right lower lobe bronchus is completely obstructed by what appears to be secretions/mucus plugging. An endobronchial lesion is considered less likely but not excluded. The pleural effusion may be secondary to chronic atelectasis from this obstructed bronchus rather than the primary pathology. Recommend referral to pulmonology for bronchoscopy. 2. Smaller loculated pleural effusion at the posterior aspect of the right apex. 3. Enlarged main pulmonary artery as can be seen in the setting of pulmonary arterial hypertension. 4.  Aortic Atherosclerosis (ICD10-170.0). 5. Coronary artery calcifications. Electronically Signed   By: Malachy Moan M.D.   On: 07/10/2017 19:41   Dg Chest Port 1 View  Result Date: 07/15/2017 CLINICAL DATA:  Status post VATS procedure. Status post chest tube removal. EXAM: PORTABLE CHEST 1 VIEW COMPARISON:  July 14, 2017 FINDINGS: One of the chest tubes on the right has been removed. A second chest tube remains unchanged in position. No pneumothorax. Central catheter tip is in the superior vena cava. There is a right pleural effusion with consolidation in the right base, stable. Left lung is clear except for mild left base atelectasis. No new opacity. Heart size and pulmonary vascularity are normal. There is aortic  atherosclerosis. No adenopathy. No bone lesions. IMPRESSION: Tube and catheter positions as described without pneumothorax. Persistent right pleural effusion with right base consolidation. Mild left base atelectasis. No new opacity. Stable cardiac silhouette. There is aortic atherosclerosis. Aortic Atherosclerosis (ICD10-I70.0). Electronically Signed   By: Bretta Bang III M.D.   On: 07/15/2017 07:48   Dg Chest Port 1 View  Result Date: 07/14/2017 CLINICAL DATA:  Empyema EXAM: PORTABLE CHEST 1 VIEW COMPARISON:  07/13/2017 FINDINGS: Right chest tube remains in place with small right apical pneumothorax, stable. Small right pleural effusion. Bibasilar atelectasis, right greater than left, stable. No acute bony abnormality. IMPRESSION: Stable small right apical pneumothorax. Bibasilar atelectasis and small right effusion, stable. Electronically Signed   By: Charlett Nose M.D.   On: 07/14/2017 07:43   Dg Chest Port 1 View  Result Date: 07/13/2017 CLINICAL DATA:  Emphysema EXAM: PORTABLE CHEST 1 VIEW COMPARISON:  07/12/2017; 07/11/2017; 07/10/2017; chest CT - 07/10/2017 FINDINGS: Grossly unchanged cardiac silhouette and mediastinal contours with atherosclerotic plaque within the thoracic aorta. Stable positioning of support apparatus. Suspected tiny right apical pneumothorax. Small to moderate size right-sided effusion with associated right basilar heterogeneous/consolidative opacities,  unchanged. Suspected trace left-sided effusion. Mild pulmonary venous congestion without frank evidence of edema. No new focal airspace opacities. No acute osseus abnormalities. IMPRESSION: 1. Stable positioning of support apparatus with development of suspected tiny right apical pneumothorax. Continued attention on follow-up is advised. 2. Grossly unchanged small to moderate-sized right-sided effusion with associated right basilar opacities, atelectasis versus infiltrate. Electronically Signed   By: Simonne Come M.D.   On:  07/13/2017 14:03   Dg Chest Port 1 View  Result Date: 07/12/2017 CLINICAL DATA:  Status post right video-assisted thoracic surgery. EXAM: PORTABLE CHEST 1 VIEW COMPARISON:  07/11/2017 FINDINGS: Right pleural effusion is smaller than it was previously, small to moderate size, still obscuring the right hemidiaphragm. There is associated right lung base opacity consistent with atelectasis or pneumonia or a combination. Right mid and upper lungs are clear. Left lung is clear. Two right-sided chest tubes have their tips projecting over the right apex. Right subclavian dual lumen central venous line has its tip projecting in the mid superior vena cava. No pneumothorax. IMPRESSION: 1. Significant decrease in right lower lung zone opacity since the prior study following surgery. There is still a small to moderate pleural effusion with associated lung base opacity either pneumonia, atelectasis or a combination. 2. Two right-sided chest tubes and right subclavian central venous line positioned as detailed. 3. No pneumothorax. Electronically Signed   By: Amie Portland M.D.   On: 07/12/2017 13:38   Dg Chest Port 1 View  Result Date: 07/10/2017 CLINICAL DATA:  67 year old female with right chest and back pain EXAM: PORTABLE CHEST 1 VIEW COMPARISON:  Prior chest x-ray 10/30/2011 FINDINGS: Dense right basilar opacity with complete obscure a shin of the right hemidiaphragm. Veil like opacity in the right mid lung. The left lung appears clear. The cardiac mediastinal contours are grossly within normal limits. Atherosclerotic calcification is present in the transverse aorta. No pneumothorax. IMPRESSION: Dense right basilar opacity likely represents a combination of pleural fluid with either atelectasis or infiltrate/pneumonia. An underlying pulmonary nodule or mass is difficult to exclude radiographically. Aortic Atherosclerosis (ICD10-170.0) Electronically Signed   By: Malachy Moan M.D.   On: 07/10/2017 18:07   Ir  Thoracentesis Asp Pleural Space W/img Guide  Result Date: 07/11/2017 INDICATION: Patient with right pleural effusion. Request is made for diagnostic and therapeutic thoracentesis. EXAM: ULTRASOUND GUIDED DIAGNOSTIC AND THERAPEUTIC RIGHT THORACENTESIS MEDICATIONS: 10 mL 1% lidocaine COMPLICATIONS: None immediate. PROCEDURE: An ultrasound guided thoracentesis was thoroughly discussed with the patient and questions answered. The benefits, risks, alternatives and complications were also discussed. The patient understands and wishes to proceed with the procedure. Written consent was obtained. Ultrasound was performed to localize and mark an adequate pocket of fluid in the right chest. The area was then prepped and draped in the normal sterile fashion. 1% Lidocaine was used for local anesthesia. Under ultrasound guidance a Safe-T-Centesis catheter was introduced. Thoracentesis was performed. The catheter was removed and a dressing applied. FINDINGS: A total of approximately 320 mL of yellow cloudy fluid was removed. Samples were sent to the laboratory as requested by the clinical team. IMPRESSION: Successful ultrasound guided diagnostic and therapeutic right thoracentesis yielding 320 mL of pleural fluid. Read by: Loyce Dys PA-C Electronically Signed   By: Richarda Overlie M.D.   On: 07/11/2017 15:47    Microbiology: Recent Results (from the past 240 hour(s))  Blood Culture (routine x 2)     Status: None   Collection Time: 07/10/17  4:34 PM  Result Value Ref Range  Status   Specimen Description BLOOD RIGHT ANTECUBITAL  Final   Special Requests   Final    BOTTLES DRAWN AEROBIC AND ANAEROBIC Blood Culture adequate volume   Culture   Final    NO GROWTH 5 DAYS Performed at [facility], [address] [place], [place] [address]    Report Status 07/15/2017 FINAL  Final  Blood Culture (routine x 2)     Status: None   Collection Time: 07/10/17  4:34 PM  Result Value Ref Range Status   Specimen  Description BLOOD RIGHT ANTECUBITAL  Final   Special Requests IN PEDIATRIC BOTTLE Blood Culture adequate volume  Final   Culture   Final    NO GROWTH 5 DAYS Performed at [facility], [address] [place], [place] [address]    Report Status 07/15/2017 FINAL  Final  Urine culture     Status: Abnormal   Collection Time: 07/10/17  6:39 PM  Result Value Ref Range Status   Specimen Description URINE, RANDOM  Final   Special Requests   Final    NONE Performed at [facility], [address], [place], [place] [address]    Culture MULTIPLE SPECIES PRESENT, SUGGEST RECOLLECTION (A)  Final   Report Status 07/12/2017 FINAL  Final  Culture, sputum-assessment     Status: None   Collection Time: 07/11/17 11:54 AM  Result Value Ref Range Status   Specimen Description EXPECTORATED SPUTUM  Final   Special Requests NONE  Final   Sputum evaluation   Final    Sputum specimen not acceptable for testing.  Please recollect.   Gram Stain Report Called to,Read Back By and Verified With: RN Gildardo Griffes [phone] [id number] MLM Performed at [facility], [address], [place], [place] [address]    Report Status 07/11/2017 FINAL  Final  Gram stain     Status: None   Collection Time: 07/11/17  1:28 PM  Result Value Ref Range Status   Specimen Description PLEURAL  Final   Special Requests RIGHT  Final   Gram Stain   Final    ABUNDANT WBC PRESENT, PREDOMINANTLY PMN NO ORGANISMS SEEN Performed at [facility], [address], [place], [place] [address]    Report Status 07/11/2017 FINAL  Final  Fungus Culture With Stain     Status: None (Preliminary result)   Collection Time: 07/11/17  1:28 PM  Result Value Ref Range Status   Fungus Stain Final report  Final    Comment: (NOTE) Performed At: [facility] [address] [place], [place] [id number] Jolene Schimke MD NU:[id number]    Fungus (Mycology) Culture PENDING  Incomplete   Fungal Source FLUID  Final    Comment: PLEURAL   Culture, body fluid-bottle     Status: None   Collection Time: 07/11/17  1:28 PM  Result Value Ref Range Status   Specimen Description PLEURAL RIGHT  Final   Special Requests FLUID  Final   Culture   Final    NO GROWTH 5 DAYS Performed at [facility], [address], [place], [place] [address]    Report Status 07/16/2017 FINAL  Final  Fungus Culture Result     Status: None   Collection Time: 07/11/17  1:28 PM  Result Value Ref Range Status   Result 1 Comment  Final    Comment: (NOTE) KOH/Calcofluor preparation:  no fungus observed. Performed At: [facility] [address] [place], [place] [id number]  Jolene Schimke MD RU:[id number]   Surgical PCR screen     Status: None   Collection Time: 07/12/17  8:06 AM  Result Value Ref Range Status   MRSA, PCR NEGATIVE NEGATIVE Final   Staphylococcus aureus NEGATIVE NEGATIVE Final    Comment: (NOTE) The Xpert SA Assay (FDA approved for NASAL specimens in patients 44 years of age and older), is one component of a comprehensive surveillance program. It is not intended to diagnose infection nor to guide or monitor treatment. Performed at [facility], [address] [place], [place] [address]   Anaerobic culture     Status: None   Collection Time: 07/12/17 10:48 AM  Result Value Ref Range Status   Specimen Description BRONCHIAL WASHINGS RIGHT  Final   Special Requests SPEC A  Final   Culture   Final    NO ANAEROBES ISOLATED Performed at [facility], [address] [place], [place] [address]    Report Status 07/16/2017 FINAL  Final  Culture, respiratory (NON-Expectorated)     Status: None   Collection Time: 07/12/17 10:48 AM  Result Value Ref Range Status   Specimen Description BRONCHIAL WASHINGS RIGHT  Final   Special Requests SPEC A  Final   Gram Stain   Final    RARE WBC PRESENT, PREDOMINANTLY PMN NO ORGANISMS SEEN    Culture   Final    RARE Consistent with normal respiratory flora. Performed  at [facility], [address], [place], [place] [address]    Report Status 07/14/2017 FINAL  Final  Body fluid culture     Status: None   Collection Time: 07/12/17 11:14 AM  Result Value Ref Range Status   Specimen Description PLEURAL RIGHT  Final   Special Requests SPEC C  Final   Gram Stain NO WBC SEEN NO ORGANISMS SEEN   Final   Culture   Final    NO GROWTH 3 DAYS Performed at [facility], [address], [place], [place] [address]    Report Status 07/15/2017 FINAL  Final  Aerobic/Anaerobic Culture (surgical/deep wound)     Status: None   Collection Time: 07/12/17 11:18 AM  Result Value Ref Range Status   Specimen Description TISSUE RIGHT PLEURAL PEEL  Final   Special Requests SPEC D  Final   Gram Stain   Final    MODERATE WBC PRESENT, PREDOMINANTLY PMN NO ORGANISMS SEEN    Culture   Final    FEW VIRIDANS STREPTOCOCCUS CONSISTENT WITH PREVIOUS RESULT NO ANAEROBES ISOLATED Performed at [facility], [address], [place], [place] [address]    Report Status 07/17/2017 FINAL  Final  Aerobic/Anaerobic Culture (surgical/deep wound)     Status: None   Collection Time: 07/12/17 11:49 AM  Result Value Ref Range Status   Specimen Description LUNG LEFT LOWE LOBE  Final   Special Requests SWABSPEC E  Final   Gram Stain   Final    MODERATE WBC PRESENT,BOTH PMN AND MONONUCLEAR FEW GRAM POSITIVE COCCI IN PAIRS AND CHAINS RARE GRAM POSITIVE RODS    Culture   Final    MODERATE VIRIDANS STREPTOCOCCUS RARE STAPHYLOCOCCUS SPECIES (COAGULASE NEGATIVE) NO ANAEROBES ISOLATED Performed at [facility], [address] [place], [place] [address]    Report Status 07/17/2017 FINAL  Final   Organism ID, Bacteria STAPHYLOCOCCUS SPECIES (COAGULASE NEGATIVE)  Final   Organism ID, Bacteria VIRIDANS STREPTOCOCCUS  Final      Susceptibility  Staphylococcus species (coagulase negative) - MIC*    CIPROFLOXACIN <=0.5 SENSITIVE Sensitive     ERYTHROMYCIN <=0.25  SENSITIVE Sensitive     GENTAMICIN <=0.5 SENSITIVE Sensitive     OXACILLIN <=0.25 SENSITIVE Sensitive     TETRACYCLINE 2 SENSITIVE Sensitive     VANCOMYCIN <=0.5 SENSITIVE Sensitive     TRIMETH/SULFA <=10 SENSITIVE Sensitive     CLINDAMYCIN <=0.25 SENSITIVE Sensitive     RIFAMPIN <=0.5 SENSITIVE Sensitive     Inducible Clindamycin NEGATIVE Sensitive     * RARE STAPHYLOCOCCUS SPECIES (COAGULASE NEGATIVE)   Viridans streptococcus - MIC*    PENICILLIN INTERMEDIATE Intermediate     CEFTRIAXONE 1 SENSITIVE Sensitive     ERYTHROMYCIN <=0.12 SENSITIVE Sensitive     LEVOFLOXACIN <=0.25 SENSITIVE Sensitive     VANCOMYCIN 0.5 SENSITIVE Sensitive     * MODERATE VIRIDANS STREPTOCOCCUS  MRSA PCR Screening     Status: None   Collection Time: 07/12/17  9:03 PM  Result Value Ref Range Status   MRSA by PCR NEGATIVE NEGATIVE Final    Comment:        The GeneXpert MRSA Assay (FDA approved for NASAL specimens only), is one component of a comprehensive MRSA colonization surveillance program. It is not intended to diagnose MRSA infection nor to guide or monitor treatment for MRSA infections. Performed at [facility], [address] [place], [place] [address]      Labs: Basic Metabolic Panel: Recent Labs  Lab 07/13/17 0306 07/14/17 0424 07/15/17 0601 07/16/17 0527 07/17/17 0400 07/18/17 0330  NA 131* 134* 134* 135 134*  --   K 3.5 3.1* 3.3* 4.4 4.4  --   CL 95* 100* 97* 99* 98*  --   CO2 25 27 27 28 28   --   GLUCOSE 201* 118* 86 90 92  --   BUN <5* 6 <5* <5* <5*  --   CREATININE 0.48 0.58 0.40* 0.48 0.44  --   CALCIUM 7.4* 7.4* 7.9* 8.0* 8.2*  --   MG  --  1.6*  --  1.8  --  1.8   Liver Function Tests: Recent Labs  Lab 07/11/17 2120 07/14/17 0424  AST 19 44*  ALT 11* 27  ALKPHOS 116 77  BILITOT 0.7 0.3  PROT 5.4* 4.3*  ALBUMIN 2.5* 1.7*   No results for input(s): LIPASE, AMYLASE in the last 168 hours. No results for input(s): AMMONIA in the last 168  hours. CBC: Recent Labs  Lab 07/13/17 0306 07/14/17 0424 07/15/17 0601 07/16/17 0527 07/17/17 0400  WBC 14.3* 10.1 9.3 9.3 8.3  HGB 11.6* 10.9* 11.8* 11.4* 10.7*  HCT 34.5* 32.6* 35.3* 35.1* 32.8*  MCV 103.0* 104.8* 103.8* 104.5* 105.5*  PLT 332 315 385 449* 466*   Cardiac Enzymes: Recent Labs  Lab 07/11/17 1406  TROPONINI <0.03   BNP: BNP (last 3 results) Recent Labs    07/10/17 1625  BNP 176.3*    ProBNP (last 3 results) No results for input(s): PROBNP in the last 8760 hours.  CBG: No results for input(s): GLUCAP in the last 168 hours.     Signed:  Penny Pia MD.  [facility] 07/18/2017, 1:14 PM

## 2017-07-18 NOTE — Progress Notes (Signed)
Removed Rt. Central line, and pt received discharge instructions. Changed past chest tube area dressing. O2 tank is at the room. Pt took her belongings. [facility] [employer]  RN

## 2017-07-23 ENCOUNTER — Ambulatory Visit (INDEPENDENT_AMBULATORY_CARE_PROVIDER_SITE_OTHER): Payer: Self-pay

## 2017-07-23 DIAGNOSIS — Z4802 Encounter for removal of sutures: Secondary | ICD-10-CM

## 2017-07-23 DIAGNOSIS — G8918 Other acute postprocedural pain: Secondary | ICD-10-CM

## 2017-07-23 MED ORDER — TRAMADOL HCL 50 MG PO TABS: 50.0000 mg | ORAL_TABLET | Freq: Four times a day (QID) | ORAL | 0 refills | Status: DC | PRN

## 2017-07-23 NOTE — Progress Notes (Signed)
Removed.2 sutures from chest tube sites with no signs of infection and patient tolerated well. She is requesting RX for Tramadol for pain prn. Dr Dorris FetchHendrickson signed the RX.

## 2017-08-05 ENCOUNTER — Other Ambulatory Visit: Payer: Self-pay | Admitting: Thoracic Surgery (Cardiothoracic Vascular Surgery)

## 2017-08-05 DIAGNOSIS — J90 Pleural effusion, not elsewhere classified: Secondary | ICD-10-CM

## 2017-08-06 ENCOUNTER — Ambulatory Visit
Admission: RE | Admit: 2017-08-06 | Discharge: 2017-08-06 | Disposition: A | Discharge status: Home / Self Care | Payer: BC Managed Care – PPO | Source: Ambulatory Visit | Attending: Thoracic Surgery (Cardiothoracic Vascular Surgery) | Admitting: Thoracic Surgery (Cardiothoracic Vascular Surgery)

## 2017-08-06 ENCOUNTER — Encounter: Payer: Self-pay | Admitting: Thoracic Surgery (Cardiothoracic Vascular Surgery)

## 2017-08-06 ENCOUNTER — Ambulatory Visit (INDEPENDENT_AMBULATORY_CARE_PROVIDER_SITE_OTHER): Payer: Self-pay | Admitting: Thoracic Surgery (Cardiothoracic Vascular Surgery)

## 2017-08-06 VITALS — BP 150/100 | HR 96 | Resp 20 | Ht 66.5 in | Wt 142.0 lb

## 2017-08-06 DIAGNOSIS — J869 Pyothorax without fistula: Secondary | ICD-10-CM

## 2017-08-06 DIAGNOSIS — Z09 Encounter for follow-up examination after completed treatment for conditions other than malignant neoplasm: Secondary | ICD-10-CM

## 2017-08-06 DIAGNOSIS — J90 Pleural effusion, not elsewhere classified: Secondary | ICD-10-CM

## 2017-08-06 DIAGNOSIS — G8918 Other acute postprocedural pain: Secondary | ICD-10-CM

## 2017-08-06 MED ORDER — TRAMADOL HCL 50 MG PO TABS: 50.0000 mg | ORAL_TABLET | Freq: Four times a day (QID) | ORAL | 0 refills | Status: DC | PRN

## 2017-08-06 NOTE — Progress Notes (Signed)
[address]       [name] [address]             [phone]    HPI: Erica Ball returns for scheduled postoperative follow-up visit  Erica Ball is a 67 year old woman with a history of tobacco and ethanol abuse.  She presented back in March with shortness of breath and chest pain.  She was found to have a complex right pleural effusion.  Thoracentesis showed the fluid was consistent with empyema.  On CT there was a question of an endobronchial lesion.  I did bronchoscopy and right VATS for decortication on 07/12/2017.  There was no endobronchial lesion, but there was extrinsic compression of the bronchus.  Her postoperative course was uncomplicated and she was discharged on day 6.  She says she felt better after she first went home and may have overdone her activities initially.  She had some soreness.  That is improving now.  She still taking tramadol occasionally.  She feels like her breathing is dramatically improved.  Past Medical History:  Diagnosis Date  . Alcohol abuse   . Allergy   . Cataract   . Hypertension   . Tobacco abuse     Current Outpatient Medications  Medication Sig Dispense Refill  . albuterol (PROVENTIL HFA;VENTOLIN HFA) 108 (90 Base) MCG/ACT inhaler Inhale 1-2 puffs into the lungs every 4 (four) hours as needed for shortness of breath.    Marland Kitchen aspirin 325 MG tablet Take 1 tablet (325 mg total) by mouth daily.    . traMADol (ULTRAM) 50 MG tablet Take 1 tablet (50 mg total) by mouth every 6 (six) hours as needed. 40 tablet 0   No current facility-administered medications for this visit.     Physical Exam BP (!) 150/100   Pulse 96   Resp 20   Ht 5' 6.5" (1.689 m)   Wt 142 lb (64.4 kg)   SpO2 94% Comment: RA  BMI 22.51 kg/m  67 year old woman in no acute distress Alert and oriented x3 with no focal deficits Lungs slightly diminished right base, otherwise clear Cardiac regular rate and rhythm Incisions healing well  Diagnostic  Tests: CHEST - 2 VIEW  COMPARISON:  07/16/2017  FINDINGS: There is a small right pleural effusion with right basilar atelectasis. There is no focal consolidation. There is no left pleural effusion or pneumothorax. The heart and mediastinal contours are unremarkable.  The osseous structures are unremarkable.  IMPRESSION: Small right pleural effusion.   Electronically Signed   By: Elige Ko   On: 08/06/2017 10:02 I personally reviewed the chest x-ray images and concur with the findings noted above  Impression: Erica Ball is a 67 year old woman with a history of tobacco and ethanol abuse who had a pneumonia complicated by empyema last month.  She required drainage of the empyema and decortication.  She has had an uncomplicated postoperative course.  There is a question of an endobronchial mass on her CT.  No endobronchial lesion was seen on bronchoscopy there was extrinsic compression of the bronchi.  I think we should repeat her CT in about 6 weeks or so to make sure there is not a mass lesion to account for that.  I want to wait to allow the infection to completely resolve before rescanning.  I suspect the narrowing was due to reactive parabronchial nodes.  She has some incisional discomfort and is occasionally using tramadol.  We gave her a prescription for an additional 30  tablets of that.  I instructed her to try and wean herself off of that.  There are no restrictions on her activities, but she was cautioned to build into new activities gradually.  She works in Aeronautical engineeraccounts receivable at [employer]  She is anxious to return to work.  I told her she can go back on April 15.  Plan: Return in 6 weeks with CT chest  Loreli SlotSteven C Dajha Urquilla, MD [facility] [phone]

## 2017-08-08 LAB — FUNGUS CULTURE WITH STAIN

## 2017-08-08 LAB — FUNGAL ORGANISM REFLEX

## 2017-08-08 LAB — FUNGUS CULTURE RESULT

## 2017-08-19 ENCOUNTER — Other Ambulatory Visit: Payer: Self-pay | Admitting: *Deleted

## 2017-08-19 DIAGNOSIS — R918 Other nonspecific abnormal finding of lung field: Secondary | ICD-10-CM

## 2017-08-20 ENCOUNTER — Telehealth: Payer: Self-pay

## 2017-08-20 NOTE — Telephone Encounter (Signed)
Patient called and stated that she was going to start taking her blood pressure medication after taking her blood pressure the past few days after I asked her to contact her PCP regarding her blood pressure.  She stated that she does not have a PCP.  She stated that her BP has been running 170/105 and 160/100.  She said that after being discharged from the hospital for VATS/ empyema 07/12/2017 she was told by the discharging physician that she was to not longer take medication because of her blood pressure.  I advised her that she needed to check her blood pressure daily and to get a PCP as soon as possible to evaluate her.  She acknowledged receipt.

## 2017-09-17 ENCOUNTER — Ambulatory Visit
Admission: RE | Admit: 2017-09-17 | Discharge: 2017-09-17 | Disposition: A | Discharge status: Home / Self Care | Payer: BC Managed Care – PPO | Source: Ambulatory Visit | Attending: Thoracic Surgery (Cardiothoracic Vascular Surgery) | Admitting: Thoracic Surgery (Cardiothoracic Vascular Surgery)

## 2017-09-17 ENCOUNTER — Ambulatory Visit: Payer: Self-pay | Admitting: Thoracic Surgery (Cardiothoracic Vascular Surgery)

## 2017-09-17 VITALS — BP 127/89 | HR 148 | Resp 20 | Ht 66.5 in | Wt 146.0 lb

## 2017-09-17 DIAGNOSIS — J869 Pyothorax without fistula: Secondary | ICD-10-CM

## 2017-09-17 DIAGNOSIS — R918 Other nonspecific abnormal finding of lung field: Secondary | ICD-10-CM

## 2017-09-17 DIAGNOSIS — J90 Pleural effusion, not elsewhere classified: Secondary | ICD-10-CM

## 2017-09-17 DIAGNOSIS — Z09 Encounter for follow-up examination after completed treatment for conditions other than malignant neoplasm: Secondary | ICD-10-CM

## 2017-09-17 MED ORDER — IOHEXOL 300 MG/ML  SOLN
75.0000 mL | Freq: Once | INTRAMUSCULAR | Status: AC | PRN
  Administered 2017-09-17: 75 mL via INTRAVENOUS

## 2017-09-17 NOTE — Progress Notes (Addendum)
[address]       [name] [address]             [phone]     HPI: [name] returns for a scheduled follow-up visit  Erica Ball is a 67 year old woman with history of hypertension, tobacco abuse, and alcohol abuse, who presented in March with shortness of breath and chest pain.  She had a complex right pleural effusion.  CT showed a complicated effusion with a possible endobronchial lesion.  I did a bronchoscopy and right VATS for decortication on 07/12/2017.  No endobronchial lesion was seen on bronchoscopy.  There was extrinsic compression.  She went home on day 6 after an uncomplicated postoperative course.  I saw her in the office on 08/06/2017.  She was doing well at that time.  I recommended a repeat CT at 6 weeks to make sure there was no suspicious lesion.  She says she is noted her heart racing for the past week.  She has been checking a pulse ox at home and its reading rates in the 150.  She does not feel short of breath per se but can sometimes feel her heart "pounding."  Past Medical History:  Diagnosis Date  . Alcohol abuse   . Allergy   . Cataract   . Hypertension   . Tobacco abuse     Current Outpatient Medications  Medication Sig Dispense Refill  . albuterol (PROVENTIL HFA;VENTOLIN HFA) 108 (90 Base) MCG/ACT inhaler Inhale 1-2 puffs into the lungs every 4 (four) hours as needed for shortness of breath.    Marland Kitchen amLODipine (NORVASC) 10 MG tablet Take 10 mg by mouth daily.     Marland Kitchen aspirin 325 MG tablet Take 1 tablet (325 mg total) by mouth daily.    Marland Kitchen tiZANidine (ZANAFLEX) 4 MG tablet Take 4 mg by mouth every 8 (eight) hours as needed.  1  . traMADol (ULTRAM) 50 MG tablet Take 1 tablet (50 mg total) by mouth every 6 (six) hours as needed. 40 tablet 0   No current facility-administered medications for this visit.     Physical Exam BP 127/89   Pulse (!) 148   Resp 20   Ht 5' 6.5" (1.689 m)   Wt 146 lb (66.2 kg)   SpO2 98% Comment: RA   BMI 23.67 kg/m  67 year old woman in no acute distress Alert and oriented x3 with no focal deficits Cardiac tachycardic with forceful PMI Lungs clear with equal breath sounds bilaterally  Diagnostic Tests: CT chest reviewed.  Has not been officially read.  I do not see an endobronchial mass lesion.  There does appear to be a large right pulmonary artery.  Possible there is some adenopathy, will defer to official radiology report.  Excellent result with decortication.  Borderline mediastinal nodes.  Impression: [name] is a 67 year old woman who recently had a decortication for an empyema of her right lung.   Empyema-resolved post decortication  Tachycardia--she is in a regular rhythm 148 bpm.  Her ECG shows a regular supraventricular rhythm possible flutter waves seen in lead I.  I advised her that she need to be seen in the emergency room today to deal with his heart rhythm.  She feels it is not emergent as her heart rate is been at that level for a week.  She has an appointment to see her primary tomorrow.  I told her there is a pretty good chance there can send her to the emergency  room unless they have the capability of administering medications in the office.  She said she would take it under advisement.  Plan: Have tachycardia evaluated.  Will call with CT results when available  Erica Slot, MD [facility] [phone]  CT CHEST WITH CONTRAST  TECHNIQUE: Multidetector CT imaging of the chest was performed during intravenous contrast administration.  CONTRAST:  75mL OMNIPAQUE IOHEXOL 300 MG/ML  SOLN  Creatinine was obtained on site at [facility] at [address]  Results: Creatinine 0.7 mg/dL.  COMPARISON:  CT 07/10/2017  FINDINGS: Cardiovascular: Coronary artery calcification and aortic atherosclerotic calcification.  Mediastinum/Nodes: No axillary or supraclavicular adenopathy. No mediastinal hilar  adenopathy. No pericardial fluid. Esophagus normal.  Lungs/Pleura: No suspicious pulmonary nodules. Linear atelectasis at the lung bases. Airways appear normal. No endobronchial lesion identified.  In comparison to CT 07/10/2017 there is near complete resolution of the pleural fluid with only small volume pleural fluid remaining. No endobronchial lesion identified within the RIGHT lower lobe bronchus.  Upper Abdomen: Limited view of the liver, kidneys, pancreas are unremarkable. Normal adrenal glands.  Musculoskeletal: No aggressive osseous lesion.  IMPRESSION: 1. Near complete resolution of the RIGHT pleural effusion. 2. No endobronchial lesion identified. 3. No suspicious nodularity. 4. Coronary artery calcification and Aortic Atherosclerosis (ICD10-I70.0).   Electronically Signed   By: Genevive Bi M.D.   On: 09/18/2017 10:29  I had previously reviewed the CT images and concur with the findings noted above.  Given her smoking history she needs annual low-dose screening CT scanning.  She can do that through Dr. Renne Crigler if she prefers, or I would be happy to do it.  She has been referred to Dr. Eden Emms regarding her tachycardia.  Salvatore Decent Dorris Fetch, MD [facility] [phone]

## 2017-09-23 ENCOUNTER — Telehealth: Payer: Self-pay | Admitting: *Deleted

## 2017-09-23 NOTE — Telephone Encounter (Signed)
REFERRAL SENT TO SCHEDULING FROM DR. Greggory Stallion [facility] [phone]

## 2017-09-25 ENCOUNTER — Telehealth: Payer: Self-pay

## 2017-09-25 NOTE — Telephone Encounter (Signed)
Added notes to May file

## 2017-12-02 ENCOUNTER — Ambulatory Visit: Payer: BC Managed Care – PPO | Admitting: Internal Medicine

## 2017-12-02 ENCOUNTER — Encounter: Payer: Self-pay | Admitting: Internal Medicine

## 2017-12-02 VITALS — BP 124/72 | HR 99 | Ht 68.5 in | Wt 141.0 lb

## 2017-12-02 DIAGNOSIS — F17210 Nicotine dependence, cigarettes, uncomplicated: Secondary | ICD-10-CM | POA: Diagnosis not present

## 2017-12-02 DIAGNOSIS — Z720 Tobacco use: Secondary | ICD-10-CM

## 2017-12-02 DIAGNOSIS — J449 Chronic obstructive pulmonary disease, unspecified: Secondary | ICD-10-CM

## 2017-12-02 DIAGNOSIS — J441 Chronic obstructive pulmonary disease with (acute) exacerbation: Secondary | ICD-10-CM | POA: Insufficient documentation

## 2017-12-02 NOTE — Patient Instructions (Addendum)
Symbicort 80 can be used up to 2 pffs every 12 hours if you feel it helps your breathing or cough   Work on inhaler technique:  relax and gently blow all the way out then take a nice smooth deep breath back in, triggering the inhaler at same time you start breathing in.  Hold for up to 5 seconds if you can. Blow out thru nose. Rinse and gargle with water when done    Only use your albuterol as a rescue medication to be used if you can't catch your breath by resting or doing a relaxed purse lip breathing pattern.  - The less you use it, the better it will work when you need it. - Ok to use up to 2 puffs  every 4 hours if you must but call for immediate appointment if use goes up over your usual need - Don't leave home without it !!  (think of it like the spare tire for your car)   The key is to stop smoking completely before smoking completely stops you!    Pulmonary follow up is as needed

## 2017-12-02 NOTE — Assessment & Plan Note (Signed)
Spirometry 12/02/2017  FEV1 2.08 (75%)  Ratio 70 / min curvature p symb 80 x 2 prior     She is back to baseline on symb 80 2bid but not sure it's helping her and based on Fletcher curve rx is for symptoms/ flares not to change the natural hx of the dx.  Should she have limiting sob or tendency to aecopd/ ab would start back on symb 80 2bid x minimum of 1 week and if not satisfied then needs to return with all meds in hand using a trust but verify approach to confirm accurate Medication  Reconciliation The principal here is that until we are certain that the  patients are doing what we've asked, it makes no sense to ask them to do more.    In meantime main goal is breathe clean air, the only way to change progression of dz    - The proper method of use, as well as anticipated side effects, of a metered-dose inhaler are discussed and demonstrated to the patient.     I had an extended discussion with the patient reviewing all relevant studies completed to date and  lasting 25 minutes of a 40  minute post hosp/ transition of care offive  visit addressing  non-specific but potentially very serious  espiratory symptoms of uncertain and potentially multiple  etiologies.   Each maintenance medication was reviewed in detail including most importantly the difference between maintenance and prns and under what circumstances the prns are to be triggered using an action plan format that is not reflected in the computer generated alphabetically organized AVS.    Please see AVS for specific instructions unique to this office visit that I personally wrote and verbalized to the the pt in detail and then reviewed with pt  by my nurse highlighting any changes in therapy/plan of care  recommended at today's visit.

## 2017-12-02 NOTE — Assessment & Plan Note (Signed)

## 2017-12-02 NOTE — Progress Notes (Signed)
Erica Ball, female    DOB: 13-Nov-1950,     MRN: [id number]    History of Present Illness  87 yowf active smoker with chronic cough x since around 2000 worse around certain smells or and not limited but very sedentary, does one room at time then much worse over several months and then admit:  Admit date: 07/10/2017 Discharge date: 07/18/2017  Discharge Diagnoses:  Principal Problem:   Acute respiratory failure with hypoxia [facility]) Active Problems:   Hypertension   Tobacco abuse   Elevated troponin   Pleural effusion on right   UTI (urinary tract infection)   Sepsis (HCC)   Lobar pneumonia (HCC)   Acute kidney injury (HCC)   Alcohol abuse   Discharge Condition: stable  Diet recommendation: Regular diet       Filed Weights   07/11/17 0600 07/12/17 0500 07/12/17 2019  Weight: 70.5 kg (155 lb 8 oz) 71.8 kg (158 lb 3.2 oz) 74.9 kg (165 lb 2 oz)    History of present illness:  HPI On 07/10/2017 by Dr. Fulton Reek Reevesis a 67 y.o.femalewith medical history significant oftobacco and alcohol abuse, hypertension, who presents with shortness breath, cough, chest pain and generalized weakness.  Patient states that she has been havingdrycough and SOBin the past 4 days, which has been progressively getting worse. She has right sided chest pain, which is constant, sharp, 10 out of 10 in severity, radiating to therightflank area . It ispleuritic, aggravated by deep breath.Nohemoptysis. No tenderness incalfareas. Patient does not have fever or chills. Patient denies nausea, vomiting, diarrhea, abdominal pain, symptoms of UTI or unilateral weakness. Pt was seen by PCP today. This is herfirst visit with this provider. CXR was taken and she was called back by PCP, instructed to come to ED due to "fluid in lung".Patient reports smoking a pack a day for 48yearsand drinks vodka every daily. Pt can barely speak in full sentence.  Interim history Admitted with  hypoxia and respiratory failure, found to have empyema. PCCM and CTS surgery consulted. Underwent bronch, VATS. Chest tubes have now been removed, patient currently improving slowly.    Hospital Course:  Acute respiratory failure with hypoxia/right pleural effusion-Empyema  -CTA was negative for pulmonary embolism however showed large right-sided pleural effusion, right lower lobe bronchus obstruction and possible lobar pneumonia -Continue supplemental oxygen to maintain saturations above 92% -Influenza PCR negative -Interventional radiology consulted and appreciated for ultrasound-guided thoracentesis-which yielded 320 mL of yellow, cloudy fluid -Pulmonology consulted for possible bronchoscopy given loculations noted on CT scan as well as possible bronchus obstruction -Pleural fluid LDH 1193, WBC 10K, Glucose <20 --> consistent with empyema -culture from thoracentesis shows no growth -Cardiothoracic surgery consulted and appreciated, s/p bronchoscopy, right VATS, drainage of empyema and decortication, chest tube placement. Cultures +moderate viridans streptococcus - intermediate to pcn -Urine strep pneumonia antigen negative -Continue nebulizer treatments and guaifenesin, incentive spirometry and flutter valve -Initially placed on azithromycin and ceftriaxone however then transitioned to Zosyn, and have de-escalated to ceftriaxone -Chest tubes have been removed by cardiothoracic surgery -Chest x-ray this morning reviewed and shows persistent consolidation in the right lung base -Patient will likely need a prolonged course of antibiotics for lung abscess and empyema -She will also need to follow-up with cardiothoracic surgery upon discharge -continue current oral pain medication regimen as listed below. Also d/c on 2 weeks of augmentin    Nonsustained SVT -The evening of 07/14/2017, patient developed SVT with rates of 180. Attempted to use vagal  maneuvers, unsuccessful. Patient given 1  dose of IV metoprolol 5 mg. Converted back to sinus rhythm with heart rate in the 80s. -Patient did feel fluttering. Blood pressure was stable. -Limited Echocardiogram obtained: EF 60-65%, wall motion was normal -Potassium 4.4 after replacement -Resolved, pt has no new complaints.  Sepsis secondary to UTI/pneumonia/Empyema -Patient presented with leukocytosis, tachycardia and tachypnea- all improved -UA: Many bacteria, TNTC WBC, moderate leukocytes -Possible pneumonia noted on CTA -d/c on 2 weeks of augmentin -Blood cultures no growth to date -Urine culture multiple species  Acute kidney injury -Resolved, Suspect secondary to dehydration and sepsis -Upon admission, Creatinine 1.09, baseline truly unknown, as last creatinine was 0.5 in 2013 -resolved.  Elevated troponin -Patient presented with pleuritic type chest pain, however does have pleural effusion and likely pneumonia/empyema -Suspect troponin due to demand ischemia and sepsis -Cardiology was contacted by the ED and spoke with Dr. Harlow Mares. Recommended to continue cycling troponin, did not feel this is ACS related. -Plan currently cycle, improving down 0.55 to <0.03 -Echocardiogram EF 60-65%, grade 1 diastolic dysfunction, aortic valve sclerosis -aspirin added, pt refused statin on day of d/c -LDL 38  Hypokalemia/ hypomagnesemia -Hypokalemia resolved, continue to monitor BMP -Magnesium currently 1.8 however will give additional dose today for goal of 2(not repeated as such)  Essential hypertension -Given sepsis, amlodipine held -Continue to monitor closely, hydralazine as needed -Her blood pressure has been stable without medications  Tobacco/alcohol abuse -Discussed cessation -Continue CIWA protocol and nicotine patch  Macrocytic anemia -likely due to recent surgery and dilutional component -hemoglobin currently 10.7      12/02/2017  1st Pulmonary office eval GOLD 0 copd / maint on symb 80 2bid / still  smoking  Chief Complaint  Patient presents with  . Pulmonary Consult    Referred by [facility] for eval of COPD. She states she was dxed with COPD in March 2019.  She states she gets SOB walking up stairs. She has prod cough with white sputum.  She has a rescue inhaler but she rarely uses it.   says completely back to baseline now =  MMRC1 = can walk nl pace, flat grade, can't hurry or go uphills or steps s sob   Min am cough/ congestion/ rattling but sputum min vol/ white   No obvious day to day or daytime variability or assoc excess/ purulent sputum or mucus plugs or hemoptysis or cp or chest tightness, subjective wheeze or overt sinus or hb symptoms.   Sleeping flat now, one pillow,  without nocturnal  or early am exacerbation  of respiratory  c/o's or need for noct saba. Also denies any obvious fluctuation of symptoms with weather or environmental changes or other aggravating or alleviating factors except as outlined above   No unusual exposure hx or h/o childhood pna/ asthma or knowledge of premature birth.  Current Allergies, Complete Past Medical History, Past Surgical History, Family History, and Social History were reviewed in [employer] record.  ROS  The following are not active complaints unless bolded Hoarseness, sore throat, dysphagia, dental problems, itching, sneezing,  nasal congestion or discharge of excess mucus or purulent secretions, ear ache,   fever, chills, sweats, unintended wt loss or wt gain, classically pleuritic or exertional cp,  orthopnea pnd or arm/hand swelling  or leg swelling, presyncope, palpitations, abdominal pain, anorexia, nausea, vomiting, diarrhea  or change in bowel habits or change in bladder habits, change in stools or change in urine, dysuria, hematuria,  rash, arthralgias, visual complaints,  headache, numbness, weakness or ataxia or problems with walking or coordination,  change in mood or  memory.             Past  Medical History:  Diagnosis Date  . Acute kidney injury (HCC) 07/10/2017  . Acute respiratory failure with hypoxia (HCC) 07/10/2017  . Alcohol abuse   . Allergy   . Cataract   . Diverticulitis 10/30/2011  . Elevated troponin 07/10/2017  . Hypertension   . Lobar pneumonia (HCC) 07/10/2017  . Pleural effusion on right 07/10/2017  . Sepsis (HCC) 07/10/2017  . Tobacco abuse   . UTI (urinary tract infection) 07/10/2017  . Vitamin D deficiency 10/30/2011    Outpatient Medications Prior to Visit  Medication Sig Dispense Refill  . albuterol (PROVENTIL HFA;VENTOLIN HFA) 108 (90 Base) MCG/ACT inhaler Inhale 1-2 puffs into the lungs every 4 (four) hours as needed for shortness of breath.    Marland Kitchen amLODipine (NORVASC) 10 MG tablet Take 10 mg by mouth daily.     Marland Kitchen aspirin 325 MG tablet Take 1 tablet (325 mg total) by mouth daily.    . sulindac (CLINORIL) 200 MG tablet Take 200 mg by mouth 2 (two) times daily.  0  . SYMBICORT 80-4.5 MCG/ACT inhaler Inhale 2 puffs into the lungs 2 (two) times daily.    Marland Kitchen tiZANidine (ZANAFLEX) 4 MG tablet Take 4 mg by mouth every 8 (eight) hours as needed.  1  . traMADol (ULTRAM) 50 MG tablet Take 1 tablet (50 mg total) by mouth every 6 (six) hours as needed. 40 tablet 0   No facility-administered medications prior to visit.              Objective:     BP 124/72 (BP Location: Left Arm, Cuff Size: Normal)   Pulse 99   Ht 5' 8.5" (1.74 m)   Wt 141 lb (64 kg)   SpO2 97%   BMI 21.13 kg/m   SpO2: 97 % RA   Wt Readings from Last 3 Encounters:  12/02/17 141 lb (64 kg)  09/17/17 146 lb (66.2 kg)  08/06/17 142 lb (64.4 kg)      Chronically ill appearing wf    HEENT: nl turbinates bilaterally, and oropharynx. Nl external ear canals without cough reflex - top dentures/ 3 bottom teeth   NECK :  without JVD/Nodes/TM/ nl carotid upstrokes bilaterally   LUNGS: no acc muscle use,  Nl contour chest which is clear to A and P bilaterally without cough on insp or exp  maneuvers   CV:  RRR  no s3 or murmur or increase in P2, and no edema   ABD:  soft and nontender with nl inspiratory excursion in the supine position. No bruits or organomegaly appreciated, bowel sounds nl  MS:  Nl gait/ ext warm without deformities, calf tenderness, cyanosis or clubbing No obvious joint restrictions   SKIN: warm and dry without lesions    NEURO:  alert, approp, nl sensorium with  no motor or cerebellar deficits apparent.       I personally reviewed images and agree with radiology impression as follows:  CXR:   w contrast 09/17/17 1. Near complete resolution of the RIGHT pleural effusion. 2. No endobronchial lesion identified. 3. No suspicious nodularity. 4. Coronary artery calcification and Aortic Atherosclerosis      Assessment   COPD GOLD 0 / still smoking  Spirometry 12/02/2017  FEV1 2.08 (75%)  Ratio 70 / min curvature p symb 80 x 2 prior  She is back to baseline on symb 80 2bid but not sure it's helping her and based on Fletcher curve rx is for symptoms/ flares not to change the natural hx of the dx.  Should she have limiting sob or tendency to aecopd/ ab would start back on symb 80 2bid x minimum of 1 week and if not satisfied then needs to return with all meds in hand using a trust but verify approach to confirm accurate Medication  Reconciliation The principal here is that until we are certain that the  patients are doing what we've asked, it makes no sense to ask them to do more.    In meantime main goal is breathe clean air, the only way to change progression of dz    - The proper method of use, as well as anticipated side effects, of a metered-dose inhaler are discussed and demonstrated to the patient.     I had an extended discussion with the patient reviewing all relevant studies completed to date and  lasting 25 minutes of a 40  minute post hosp/ transition of care offive  visit addressing  non-specific but potentially very serious   espiratory symptoms of uncertain and potentially multiple  etiologies.   Each maintenance medication was reviewed in detail including most importantly the difference between maintenance and prns and under what circumstances the prns are to be triggered using an action plan format that is not reflected in the computer generated alphabetically organized AVS.    Please see AVS for specific instructions unique to this office visit that I personally wrote and verbalized to the the pt in detail and then reviewed with pt  by my nurse highlighting any changes in therapy/plan of care  recommended at today's visit.       Tobacco abuse 4-5 min discussion re active cigarette smoking in addition to office E&M  Ask about tobacco use:   ongoing Advise quitting   I emphasized that although we never turn away smokers from the pulmonary clinic, we do ask that they understand that the recommendations that we make  won't work nearly as well in the presence of continued cigarette exposure. In fact, we may very well  reach a point where we can't promise to help the patient if he/she can't quit smoking. (We can and will promise to try to help, we just can't promise what we recommend will really work)  Assess willingness:  Not committed at this point Assist in quit attempt:  Per PCP when ready Arrange follow up:   Follow up per Primary Care planned           Sandrea HughsMichael Donal Lynam, MD 12/02/2017

## 2017-12-03 NOTE — Progress Notes (Signed)
Cardiology Office Note   Date:  12/06/2017   ID:  Erica Ball, DOB Sep 18, 1950, MRN [id number]  PCP:  Norm SaltVanstory, Ashley N, PA  Cardiologist:   Charlton HawsPeter Davaris Youtsey, MD   No chief complaint on file.     History of Present Illness: Erica AlimentKathleen M Ball is a 67 y.o. female who presents for consultation regarding palpitations Referred by Dr Sherene SiresWert.  She is an ongoing smoker with significant COPD. CRF HTN. Atypical right sided chest pain in setting of URI and COPD exacerbation Admitted to hospital with empyema needing bronch and VATS Cultures grew streptoccoccus viridans March 2019 Also alcoholic drinking excessive vodka every day While in hospital had SVT rates 180 that converted with iv metroprolol TTE done in hospital reviewed EF 60-65% mild TR estimated PA 53 mmHg  Troponin was elevated at .55 on admission   Still smoking Tends not to get flu shots and has not had pneumococcal vaccine No chest pain just exertional dyspnea. Brother Erica Ball is a patient of mine and just Got a  AICD   Past Medical History:  Diagnosis Date  . Acute kidney injury (HCC) 07/10/2017  . Acute respiratory failure with hypoxia (HCC) 07/10/2017  . Alcohol abuse   . Allergy   . Cataract   . Diverticulitis 10/30/2011  . Elevated troponin 07/10/2017  . Hypertension   . Lobar pneumonia (HCC) 07/10/2017  . Pleural effusion on right 07/10/2017  . Sepsis (HCC) 07/10/2017  . Tobacco abuse   . UTI (urinary tract infection) 07/10/2017  . Vitamin D deficiency 10/30/2011    Past Surgical History:  Procedure Laterality Date  . ABDOMINAL HYSTERECTOMY    . CATARACT EXTRACTION    . FRACTURE SURGERY    . IR THORACENTESIS ASP PLEURAL SPACE W/IMG GUIDE  07/11/2017  . VIDEO ASSISTED THORACOSCOPY (VATS)/EMPYEMA Right 07/12/2017   Procedure: right VIDEO ASSISTED THORACOSCOPY (VATS) for drainage of EMPYEMA and decortication;  Surgeon: Loreli SlotHendrickson, Steven C, MD;  [facility];  Service: Thoracic;  Laterality: Right;  Marland Kitchen. VIDEO BRONCHOSCOPY  N/A 07/12/2017   Procedure: VIDEO BRONCHOSCOPY;  Surgeon: Loreli SlotHendrickson, Steven C, MD;  [facility];  Service: Thoracic;  Laterality: N/A;     Current Outpatient Medications  Medication Sig Dispense Refill  . albuterol (PROVENTIL HFA;VENTOLIN HFA) 108 (90 Base) MCG/ACT inhaler Inhale 1-2 puffs into the lungs every 4 (four) hours as needed for shortness of breath.    Marland Kitchen. amLODipine (NORVASC) 10 MG tablet Take 10 mg by mouth daily.     . sulindac (CLINORIL) 200 MG tablet Take 200 mg by mouth 2 (two) times daily.  0  . SYMBICORT 80-4.5 MCG/ACT inhaler Inhale 2 puffs into the lungs 2 (two) times daily.    . traMADol (ULTRAM) 50 MG tablet Take 1 tablet (50 mg total) by mouth every 6 (six) hours as needed. 40 tablet 0   No current facility-administered medications for this visit.     Allergies:   Dextromethorphan    Social History:  The patient  reports that she has been smoking cigarettes.  She has a 79.50 pack-year smoking history. She has never used smokeless tobacco. She reports that she drinks about 12.6 oz of alcohol per week. She reports that she does not use drugs.   Family History:  The patient's family history includes Heart disease in her brother, father, and mother; Hypertension in her brother, father, and mother; Stroke in her brother.    ROS:  Please see the history of present illness.  Otherwise, review of systems are positive for none.   All other systems are reviewed and negative.    PHYSICAL EXAM: VS:  BP 130/78   Pulse 98   Ht 5' 8.5" (1.74 m)   Wt 140 lb 8 oz (63.7 kg)   SpO2 95%   BMI 21.05 kg/m  , BMI Body mass index is 21.05 kg/m. Affect appropriate Chronically ill female  HEENT: normal Neck supple with no adenopathy JVP normal no bruits no thyromegaly Lungs rhonchi and exp wheezing post right VATS and good diaphragmatic motion Heart:  S1/S2 no murmur, no rub, gallop or click PMI normal Abdomen: benighn, BS positve, no tenderness, no AAA no bruit.  No HSM  or HJR Distal pulses intact with no bruits No edema Neuro non-focal Skin warm and dry No muscular weakness    EKG:  07/14/17 SR rate 104 normal    Recent Labs: 07/10/2017: B Natriuretic Peptide 176.3 07/11/2017: TSH 2.922 07/14/2017: ALT 27 07/17/2017: BUN <5; Creatinine, Ser 0.44; Hemoglobin 10.7; Platelets 466; Potassium 4.4; Sodium 134 07/18/2017: Magnesium 1.8    Lipid Panel    Component Value Date/Time   CHOL 88 07/11/2017 0304   TRIG 47 07/11/2017 0304   HDL 41 07/11/2017 0304   CHOLHDL 2.1 07/11/2017 0304   VLDL 9 07/11/2017 0304   LDLCALC 38 07/11/2017 0304      Wt Readings from Last 3 Encounters:  12/06/17 140 lb 8 oz (63.7 kg)  12/02/17 141 lb (64 kg)  09/17/17 146 lb (66.2 kg)      Other studies Reviewed: Additional studies/ records that were reviewed today include: Hospital notes, Notes Pulmonary Dr Sherene Sires TTE, CT labs and ECG;s .    ASSESSMENT AND PLAN:  1.  SVT:  Self limited in setting of sepsis, respiratory failure and empyema  improved 2. COPD:  48 plus pack year history of smoking Major issue. Combination of smoking And alcoholism let to empyema and need for VATS F/U Dr Sherene Sires 3. ETOH Abuse:  Discussed cessation with patient  4. HTN:  Well controlled.  Continue current medications and low sodium Dash type diet.   5. Elevated Troponin:  In setting of SVT and sepsis. TTE with normal EF no RWMAls atypical Chest pain due to lung disease ECG normal f/u Lexiscan myovue ordered    Current medicines are reviewed at length with the patient today.  The patient does not have concerns regarding medicines.  The following changes have been made:  no change  Labs/ tests ordered today include: Lexiscan Myovue  No orders of the defined types were placed in this encounter.    Disposition:   FU with cardiology in a year     Signed, Charlton Haws, MD  12/06/2017 9:50 AM    [facility] [address] [place], [place], [place]  [address] Phone: [phone]; Fax: [phone]

## 2017-12-06 ENCOUNTER — Encounter: Payer: Self-pay | Admitting: Cardiovascular Disease

## 2017-12-06 ENCOUNTER — Ambulatory Visit: Payer: BC Managed Care – PPO | Admitting: Cardiovascular Disease

## 2017-12-06 ENCOUNTER — Encounter

## 2017-12-06 VITALS — BP 130/78 | HR 98 | Ht 68.5 in | Wt 140.5 lb

## 2017-12-06 DIAGNOSIS — I471 Supraventricular tachycardia: Secondary | ICD-10-CM | POA: Diagnosis not present

## 2017-12-06 DIAGNOSIS — R079 Chest pain, unspecified: Secondary | ICD-10-CM

## 2017-12-06 MED ORDER — ASPIRIN EC 81 MG PO TBEC: 81.0000 mg | DELAYED_RELEASE_TABLET | Freq: Every day | ORAL | Status: AC

## 2017-12-06 NOTE — Patient Instructions (Addendum)
Medication Instructions:  Your physician has recommended you make the following change in your medication:  1-Decrease Aspirin 81 mg by mouth daily  Labwork: NONE  Testing/Procedures: Your physician has requested that you have a lexiscan myoview. For further information please visit [email or web address]. Please follow instruction sheet, as given.  Follow-Up: Your physician wants you to follow-up in: 12 months with Dr. Eden EmmsNishan. You will receive a reminder letter in the mail two months in advance. If you don't receive a letter, please call our office to schedule the follow-up appointment.   If you need a refill on your cardiac medications before your next appointment, please call your pharmacy.

## 2017-12-10 ENCOUNTER — Telehealth (HOSPITAL_COMMUNITY): Payer: Self-pay | Admitting: *Deleted

## 2017-12-10 NOTE — Telephone Encounter (Signed)
Patient given detailed instructions per Myocardial Perfusion Study Information Sheet for the test on 12/13/17 Patient notified to arrive 15 minutes early and that it is imperative to arrive on time for appointment to keep from having the test rescheduled.  If you need to cancel or reschedule your appointment, please call the office within 24 hours of your appointment. . Patient verbalized understanding. Jordann Grime Jacqueline    

## 2017-12-12 ENCOUNTER — Encounter (HOSPITAL_COMMUNITY): Payer: BC Managed Care – PPO | Attending: Cardiovascular Disease

## 2017-12-12 DIAGNOSIS — R079 Chest pain, unspecified: Secondary | ICD-10-CM | POA: Insufficient documentation

## 2017-12-12 LAB — MYOCARDIAL PERFUSION IMAGING
CHL CUP NUCLEAR SSS: 9
LHR: 0.34
LVDIAVOL: 71 mL (ref 46–106)
LVSYSVOL: 13 mL
NUC STRESS TID: 1.1
Peak HR: 100 {beats}/min
Rest HR: 80 {beats}/min
SDS: 2
SRS: 10

## 2017-12-12 MED ORDER — TECHNETIUM TC 99M TETROFOSMIN IV KIT
31.6000 | PACK | Freq: Once | INTRAVENOUS | Status: AC | PRN
  Administered 2017-12-12: 31.6 via INTRAVENOUS
  Filled 2017-12-12: qty 32

## 2017-12-12 MED ORDER — TECHNETIUM TC 99M TETROFOSMIN IV KIT
9.8000 | PACK | Freq: Once | INTRAVENOUS | Status: AC | PRN
  Administered 2017-12-12: 9.8 via INTRAVENOUS
  Filled 2017-12-12: qty 10

## 2017-12-12 MED ORDER — REGADENOSON 0.4 MG/5ML IV SOLN
0.4000 mg | Freq: Once | INTRAVENOUS | Status: AC
  Administered 2017-12-12: 0.4 mg via INTRAVENOUS

## 2019-01-20 IMAGING — US IR THORACENTESIS ASP PLEURAL SPACE W/IMG GUIDE
1 series · 2 of 2 positions shown · non-contrast
Comparison: none

INDICATION: Patient with right pleural effusion. Request is made for diagnostic
and therapeutic thoracentesis.

[Series 1: ir (id) (id)/(id)/(id) ir · 2 of 2 slices shown]
[im 1/2]
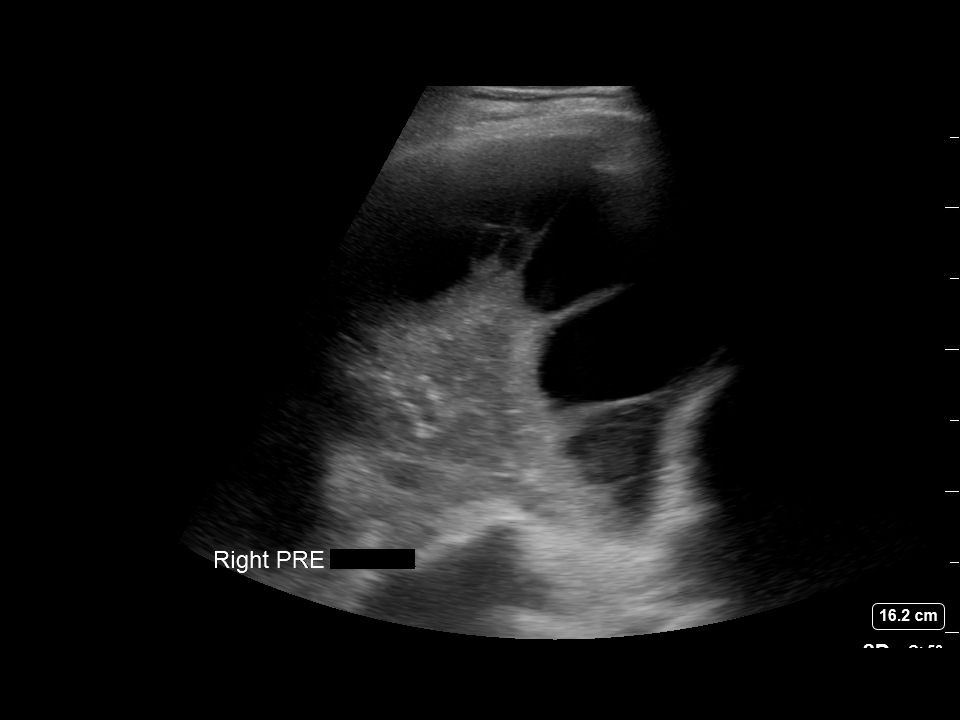
[im 2/2]
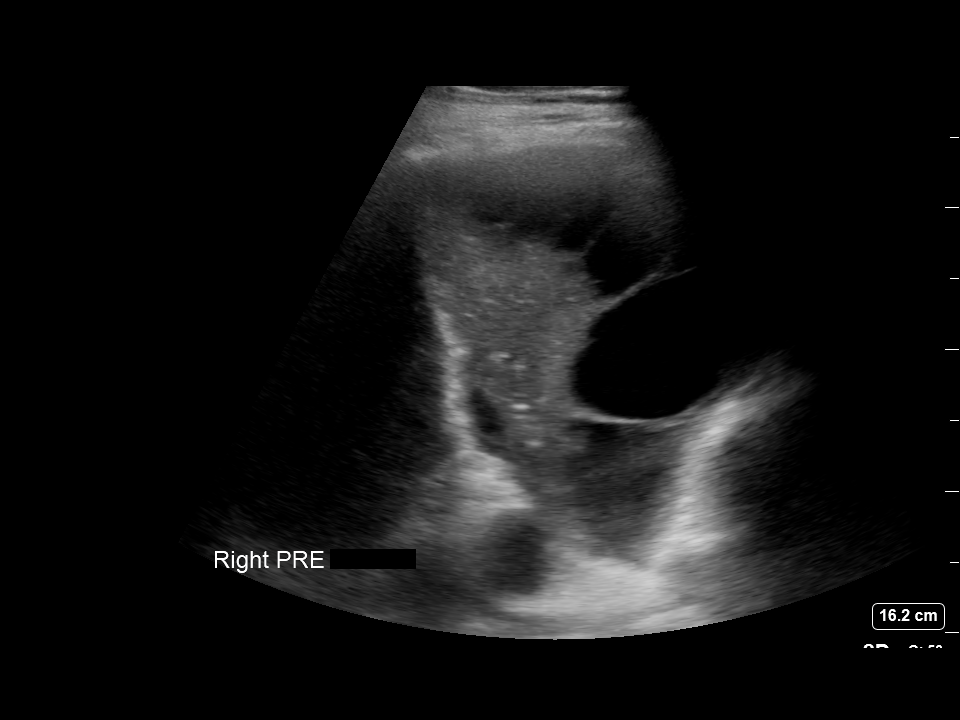

[2 of 2 positions shown; findings below may reference images not displayed]

EXAM:
ULTRASOUND GUIDED DIAGNOSTIC AND THERAPEUTIC RIGHT THORACENTESIS

MEDICATIONS:
10 mL 1% lidocaine

COMPLICATIONS:
None immediate.

PROCEDURE:
An ultrasound guided thoracentesis was thoroughly discussed with the
patient and questions answered. The benefits, risks, alternatives
and complications were also discussed. The patient understands and
wishes to proceed with the procedure. Written consent was obtained.

Ultrasound was performed to localize and mark an adequate pocket of
fluid in the right chest. The area was then prepped and draped in
the normal sterile fashion. 1% Lidocaine was used for local
anesthesia. Under ultrasound guidance a Safe-T-Centesis catheter was
introduced. Thoracentesis was performed. The catheter was removed
and a dressing applied.
FINDINGS: A total of approximately 320 mL of yellow cloudy fluid was removed.
Samples were sent to the laboratory as requested by the clinical
team.
IMPRESSION: Successful ultrasound guided diagnostic and therapeutic right
thoracentesis yielding 320 mL of pleural fluid.

## 2019-01-23 IMAGING — DX DG CHEST 1V PORT
1 series · 1 of 1 positions shown · non-contrast
Comparison: 07/13/2017

CLINICAL DATA: Empyema

EXAM:
PORTABLE CHEST 1 VIEW

[chest ap]
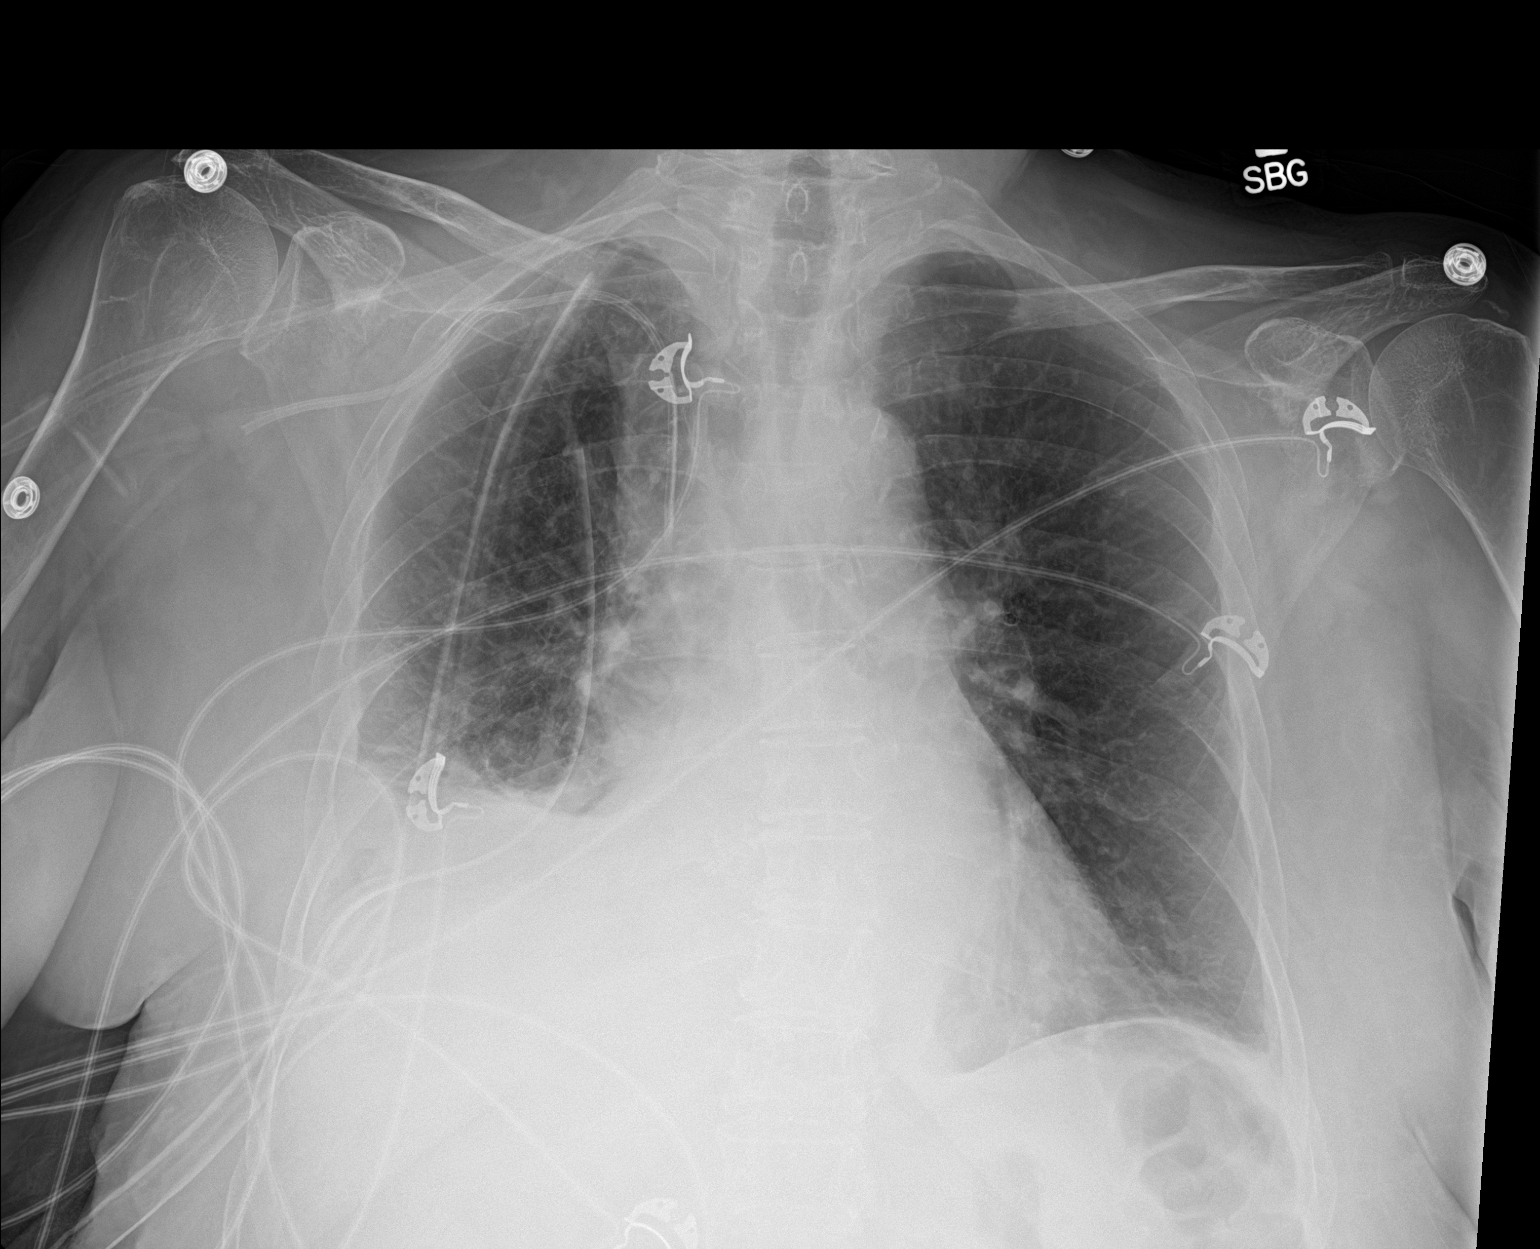

[1 of 1 positions shown; findings below may reference images not displayed]

FINDINGS: Right chest tube remains in place with small right apical
pneumothorax, stable. Small right pleural effusion. Bibasilar
atelectasis, right greater than left, stable. No acute bony
abnormality.
IMPRESSION: Stable small right apical pneumothorax.

Bibasilar atelectasis and small right effusion, stable.

## 2019-01-25 IMAGING — DX DG CHEST 2V
2 series · 2 of 2 positions shown · non-contrast
Comparison: Chest radiograph from one day prior.

CLINICAL DATA: Empyema

EXAM:
CHEST - 2 VIEW

[chest pa]
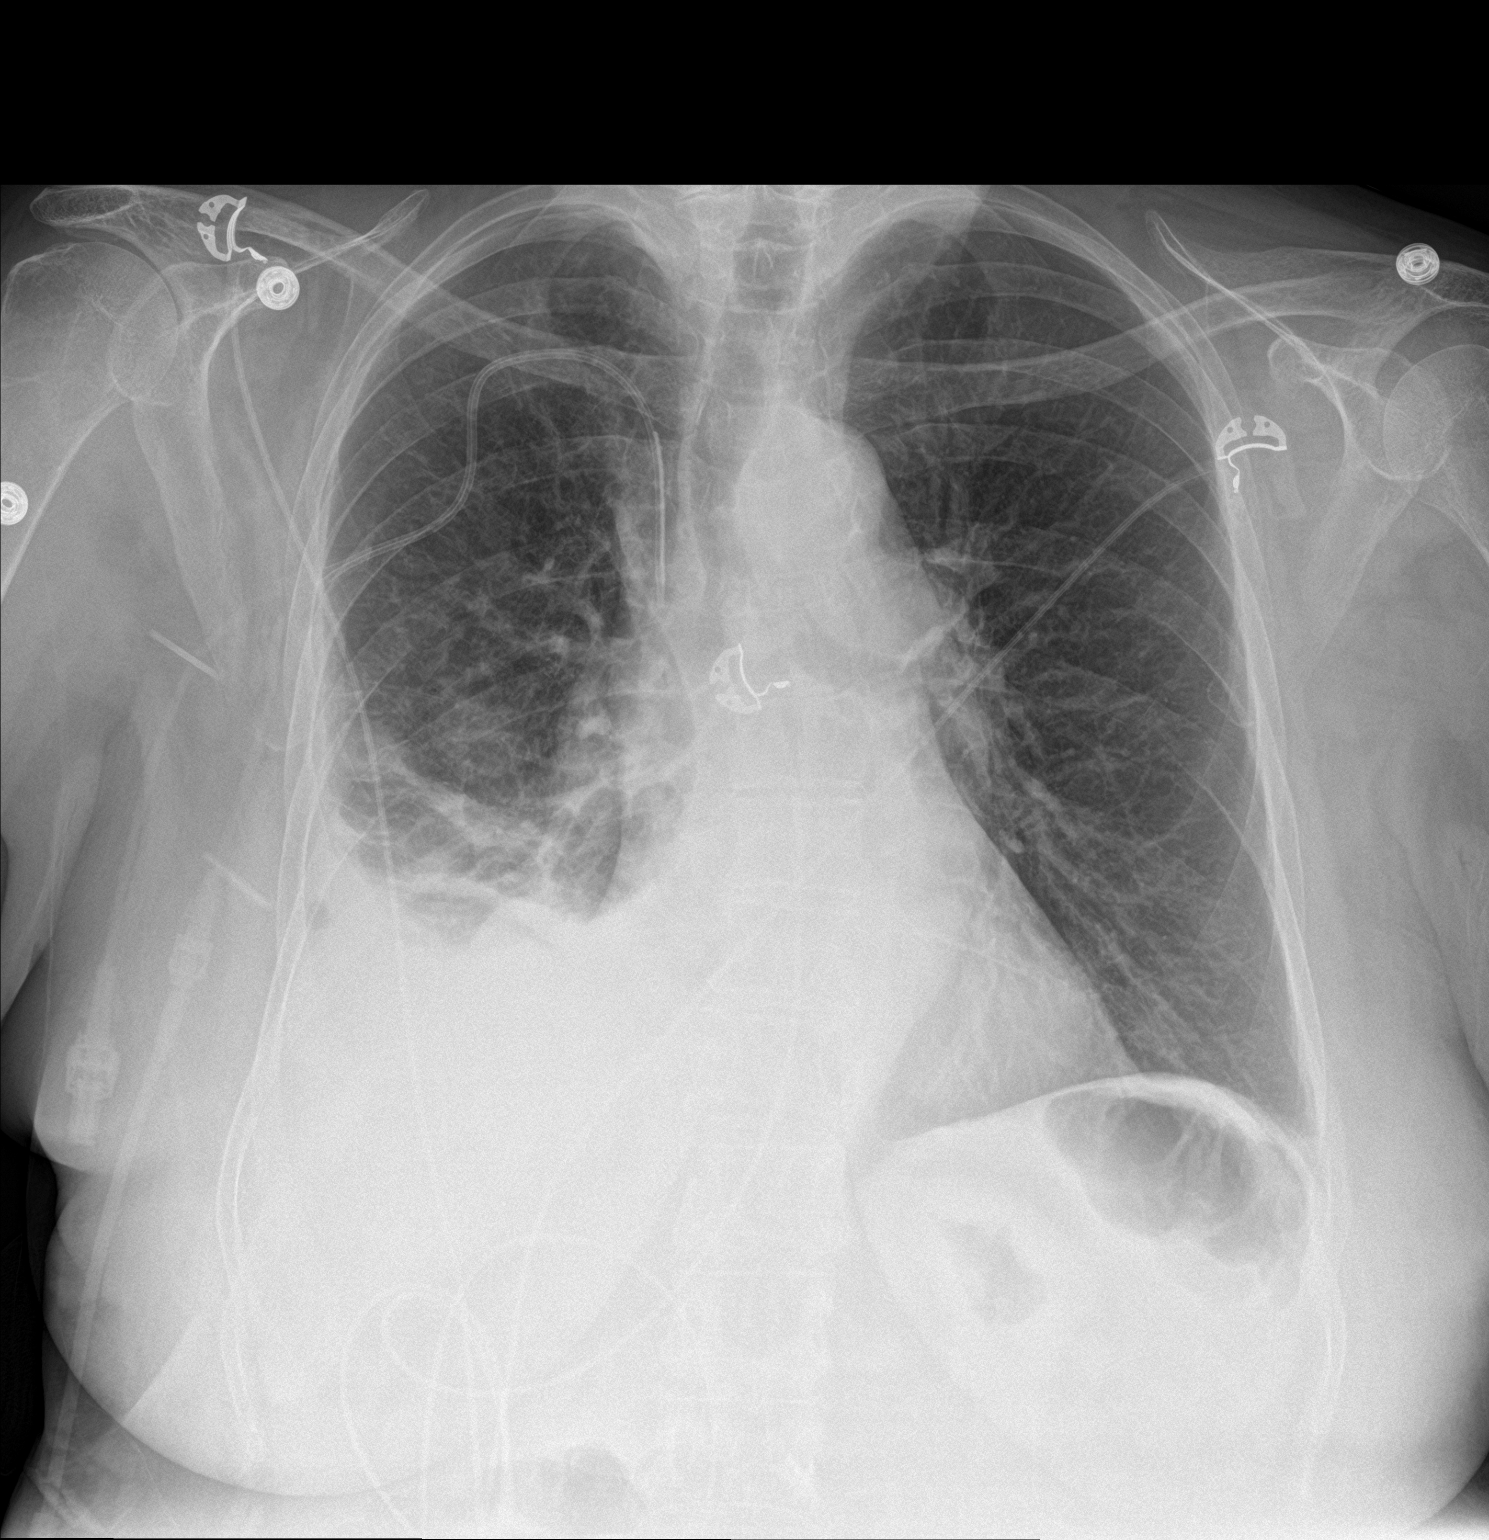

[chest lat]
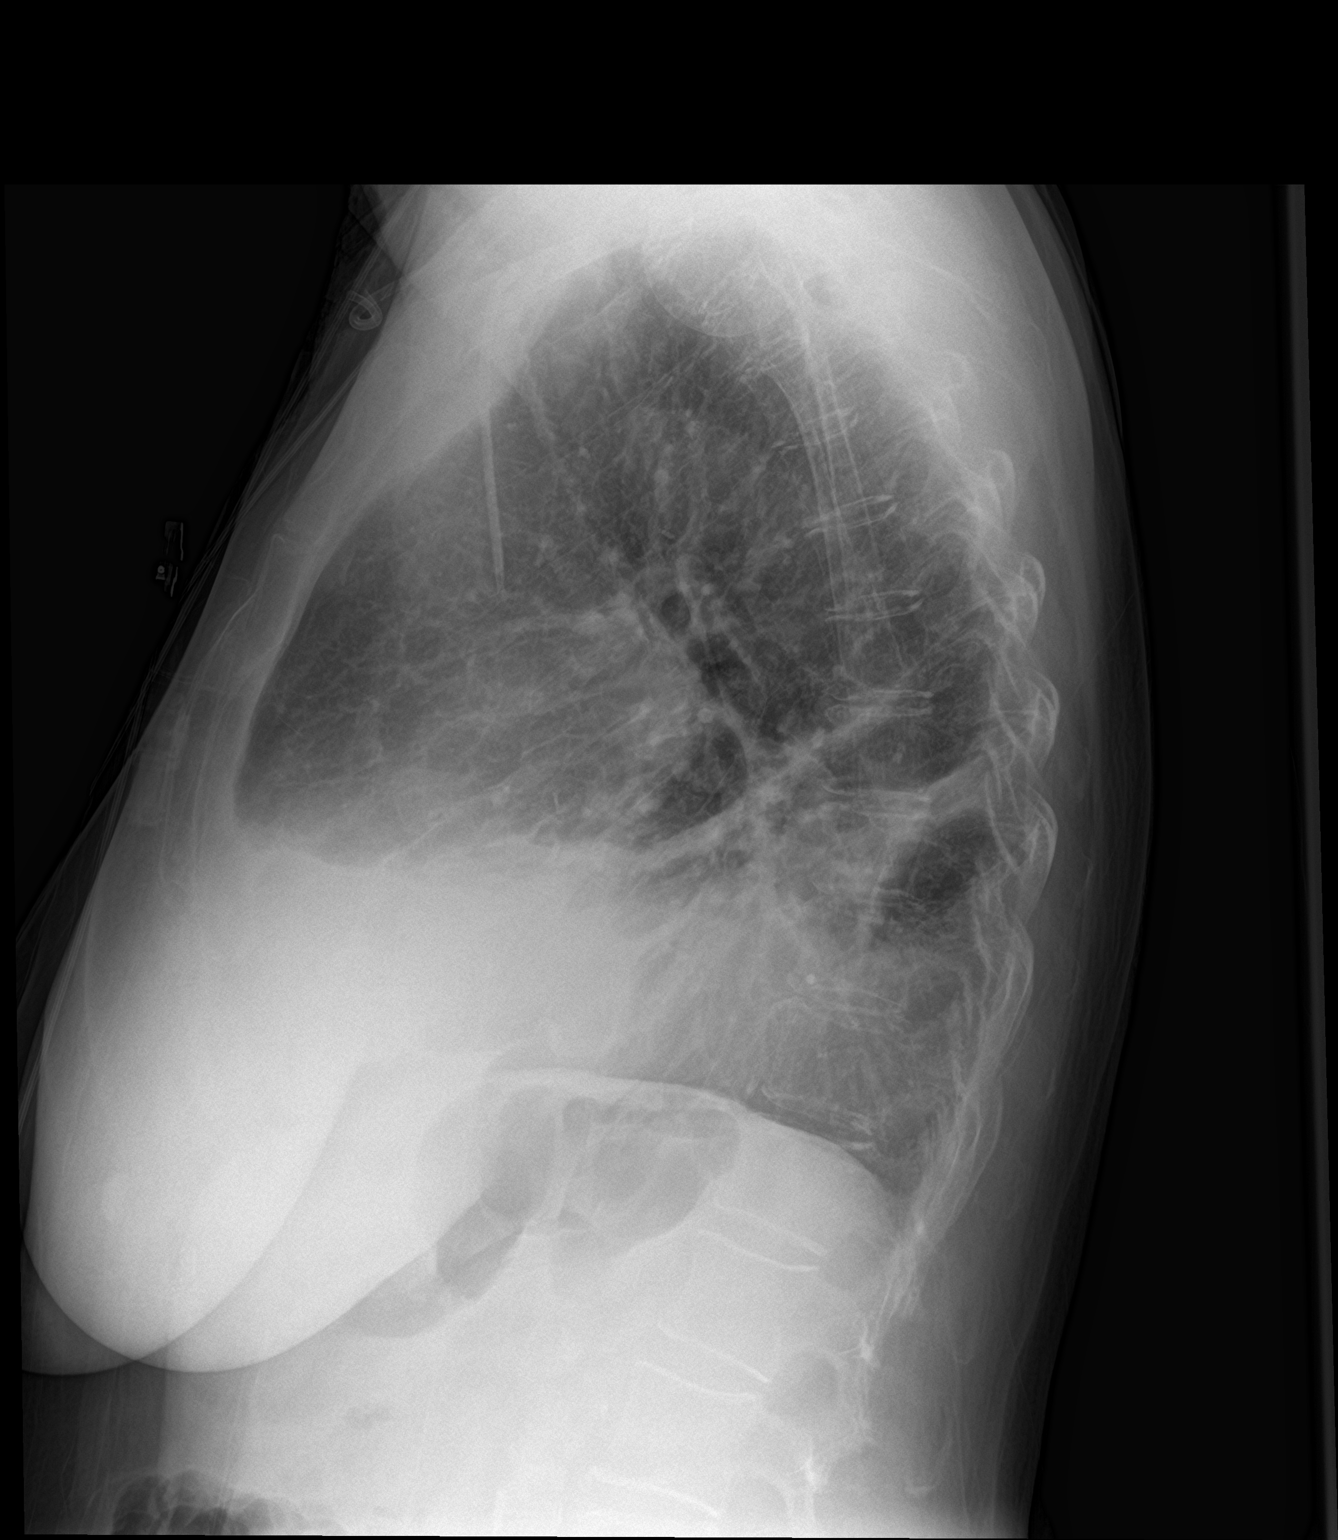

[2 of 2 positions shown; findings below may reference images not displayed]

FINDINGS: Right subclavian central venous catheter terminates in upper
superior vena cava, unchanged. Interval removal of right chest tube.
Stable cardiomediastinal silhouette with normal heart size. No
pneumothorax. Small right basilar pleural effusion with associated
new tiny focus of pleural gas, stable in size. No left pleural
effusion. No pulmonary edema. Stable patchy right lung base opacity.
IMPRESSION: 1. Small basilar right hydropneumothorax status post right chest
tube removal, stable in size.
2. Patchy right lung base opacity is stable.

## 2019-09-24 ENCOUNTER — Ambulatory Visit: Payer: BC Managed Care – PPO

## 2019-09-24 ENCOUNTER — Ambulatory Visit: Payer: BC Managed Care – PPO | Attending: Family

## 2019-09-24 DIAGNOSIS — Z23 Encounter for immunization: Secondary | ICD-10-CM

## 2019-09-24 NOTE — Progress Notes (Signed)
   Covid-19 Vaccination Clinic  Name:  Erica Ball    MRN: [id number] DOB: [birth year]  09/24/2019  [name] was observed post Covid-19 immunization for 15 minutes without incident. She was provided with Vaccine Information Sheet and instruction to access the V-Safe system.   [name] was instructed to call 911 with any severe reactions post vaccine: Marland Kitchen Difficulty breathing  . Swelling of face and throat  . A fast heartbeat  . A bad rash all over body  . Dizziness and weakness   Immunizations Administered    Name Date Dose VIS Date Route   Pfizer COVID-19 Vaccine 09/24/2019  1:07 PM 0.3 mL 07/01/2018 Intramuscular   Manufacturer: [employer], [employer]   Lot: [id number]   NDC: [address]-9

## 2019-10-20 ENCOUNTER — Ambulatory Visit: Payer: BC Managed Care – PPO | Attending: Internal Medicine

## 2019-10-20 DIAGNOSIS — Z23 Encounter for immunization: Secondary | ICD-10-CM

## 2019-10-20 NOTE — Progress Notes (Signed)
   Covid-19 Vaccination Clinic  Name:  MELAT WRISLEY    MRN: [id number] DOB: 1950/09/17  10/20/2019  Ms. Veltri was observed post Covid-19 immunization for 15 minutes without incident. She was provided with Vaccine Information Sheet and instruction to access the V-Safe system.   Ms. Markiewicz was instructed to call 911 with any severe reactions post vaccine: Marland Kitchen Difficulty breathing  . Swelling of face and throat  . A fast heartbeat  . A bad rash all over body  . Dizziness and weakness   Immunizations Administered    Name Date Dose VIS Date Route   Pfizer COVID-19 Vaccine 10/20/2019  1:11 PM 0.3 mL 07/01/2018 Intramuscular   Manufacturer: [employer], [employer]   Lot: [id number]   NDC: [address]-1

## 2022-10-23 ENCOUNTER — Emergency Department (HOSPITAL_COMMUNITY): Payer: Medicare HMO

## 2022-10-23 ENCOUNTER — Inpatient Hospital Stay (HOSPITAL_COMMUNITY)
Admission: EM | Admit: 2022-10-23 | Discharge: 2022-11-06 | DRG: 562 | Disposition: A | Discharge status: Skilled Nursing Facility | Payer: Medicare HMO | Attending: Internal Medicine | Admitting: Internal Medicine

## 2022-10-23 ENCOUNTER — Encounter (HOSPITAL_COMMUNITY): Payer: Self-pay

## 2022-10-23 ENCOUNTER — Other Ambulatory Visit: Payer: Self-pay

## 2022-10-23 DIAGNOSIS — Z720 Tobacco use: Secondary | ICD-10-CM | POA: Diagnosis present

## 2022-10-23 DIAGNOSIS — Y92009 Unspecified place in unspecified non-institutional (private) residence as the place of occurrence of the external cause: Secondary | ICD-10-CM

## 2022-10-23 DIAGNOSIS — S8262XA Displaced fracture of lateral malleolus of left fibula, initial encounter for closed fracture: Secondary | ICD-10-CM | POA: Diagnosis present

## 2022-10-23 DIAGNOSIS — J441 Chronic obstructive pulmonary disease with (acute) exacerbation: Secondary | ICD-10-CM | POA: Diagnosis present

## 2022-10-23 DIAGNOSIS — G9341 Metabolic encephalopathy: Secondary | ICD-10-CM | POA: Diagnosis not present

## 2022-10-23 DIAGNOSIS — S92301A Fracture of unspecified metatarsal bone(s), right foot, initial encounter for closed fracture: Secondary | ICD-10-CM | POA: Diagnosis not present

## 2022-10-23 DIAGNOSIS — Z6821 Body mass index (BMI) 21.0-21.9, adult: Secondary | ICD-10-CM

## 2022-10-23 DIAGNOSIS — F1011 Alcohol abuse, in remission: Secondary | ICD-10-CM

## 2022-10-23 DIAGNOSIS — Z8249 Family history of ischemic heart disease and other diseases of the circulatory system: Secondary | ICD-10-CM

## 2022-10-23 DIAGNOSIS — N390 Urinary tract infection, site not specified: Secondary | ICD-10-CM | POA: Diagnosis not present

## 2022-10-23 DIAGNOSIS — S82142A Displaced bicondylar fracture of left tibia, initial encounter for closed fracture: Principal | ICD-10-CM | POA: Diagnosis present

## 2022-10-23 DIAGNOSIS — W19XXXA Unspecified fall, initial encounter: Secondary | ICD-10-CM | POA: Diagnosis not present

## 2022-10-23 DIAGNOSIS — I6381 Other cerebral infarction due to occlusion or stenosis of small artery: Secondary | ICD-10-CM | POA: Diagnosis present

## 2022-10-23 DIAGNOSIS — Z8673 Personal history of transient ischemic attack (TIA), and cerebral infarction without residual deficits: Secondary | ICD-10-CM

## 2022-10-23 DIAGNOSIS — Z888 Allergy status to other drugs, medicaments and biological substances status: Secondary | ICD-10-CM

## 2022-10-23 DIAGNOSIS — R0689 Other abnormalities of breathing: Secondary | ICD-10-CM | POA: Diagnosis not present

## 2022-10-23 DIAGNOSIS — J449 Chronic obstructive pulmonary disease, unspecified: Secondary | ICD-10-CM | POA: Diagnosis present

## 2022-10-23 DIAGNOSIS — F17210 Nicotine dependence, cigarettes, uncomplicated: Secondary | ICD-10-CM | POA: Diagnosis present

## 2022-10-23 DIAGNOSIS — F1010 Alcohol abuse, uncomplicated: Secondary | ICD-10-CM | POA: Diagnosis not present

## 2022-10-23 DIAGNOSIS — Y9301 Activity, walking, marching and hiking: Secondary | ICD-10-CM | POA: Diagnosis present

## 2022-10-23 DIAGNOSIS — F10139 Alcohol abuse with withdrawal, unspecified: Secondary | ICD-10-CM | POA: Diagnosis not present

## 2022-10-23 DIAGNOSIS — M25562 Pain in left knee: Secondary | ICD-10-CM | POA: Diagnosis not present

## 2022-10-23 DIAGNOSIS — I639 Cerebral infarction, unspecified: Secondary | ICD-10-CM

## 2022-10-23 DIAGNOSIS — E871 Hypo-osmolality and hyponatremia: Secondary | ICD-10-CM | POA: Diagnosis present

## 2022-10-23 DIAGNOSIS — Y92019 Unspecified place in single-family (private) house as the place of occurrence of the external cause: Secondary | ICD-10-CM

## 2022-10-23 DIAGNOSIS — I471 Supraventricular tachycardia, unspecified: Secondary | ICD-10-CM

## 2022-10-23 DIAGNOSIS — E785 Hyperlipidemia, unspecified: Secondary | ICD-10-CM | POA: Diagnosis present

## 2022-10-23 DIAGNOSIS — I2489 Other forms of acute ischemic heart disease: Secondary | ICD-10-CM

## 2022-10-23 DIAGNOSIS — R471 Dysarthria and anarthria: Secondary | ICD-10-CM | POA: Diagnosis not present

## 2022-10-23 DIAGNOSIS — E440 Moderate protein-calorie malnutrition: Secondary | ICD-10-CM | POA: Insufficient documentation

## 2022-10-23 DIAGNOSIS — E854 Organ-limited amyloidosis: Secondary | ICD-10-CM | POA: Diagnosis present

## 2022-10-23 DIAGNOSIS — R54 Age-related physical debility: Secondary | ICD-10-CM | POA: Diagnosis present

## 2022-10-23 DIAGNOSIS — S92341A Displaced fracture of fourth metatarsal bone, right foot, initial encounter for closed fracture: Secondary | ICD-10-CM | POA: Diagnosis present

## 2022-10-23 DIAGNOSIS — Z7982 Long term (current) use of aspirin: Secondary | ICD-10-CM

## 2022-10-23 DIAGNOSIS — Z79899 Other long term (current) drug therapy: Secondary | ICD-10-CM

## 2022-10-23 DIAGNOSIS — R29810 Facial weakness: Secondary | ICD-10-CM | POA: Diagnosis not present

## 2022-10-23 DIAGNOSIS — W1839XA Other fall on same level, initial encounter: Secondary | ICD-10-CM | POA: Diagnosis present

## 2022-10-23 DIAGNOSIS — Z823 Family history of stroke: Secondary | ICD-10-CM

## 2022-10-23 DIAGNOSIS — Z90710 Acquired absence of both cervix and uterus: Secondary | ICD-10-CM

## 2022-10-23 DIAGNOSIS — I680 Cerebral amyloid angiopathy: Secondary | ICD-10-CM | POA: Diagnosis present

## 2022-10-23 DIAGNOSIS — I10 Essential (primary) hypertension: Secondary | ICD-10-CM | POA: Diagnosis present

## 2022-10-23 DIAGNOSIS — S92331A Displaced fracture of third metatarsal bone, right foot, initial encounter for closed fracture: Secondary | ICD-10-CM | POA: Diagnosis present

## 2022-10-23 HISTORY — DX: Supraventricular tachycardia, unspecified: I47.10

## 2022-10-23 LAB — BASIC METABOLIC PANEL
Anion gap: 12 (ref 5–15)
BUN: 9 mg/dL (ref 8–23)
CO2: 29 mmol/L (ref 22–32)
Calcium: 9 mg/dL (ref 8.9–10.3)
Chloride: 86 mmol/L — ABNORMAL LOW (ref 98–111)
Creatinine, Ser: 0.75 mg/dL (ref 0.44–1.00)
GFR, Estimated: >60 mL/min (ref >60)
Glucose, Bld: 135 mg/dL — ABNORMAL HIGH (ref 70–99)
Potassium: 4.1 mmol/L (ref 3.5–5.1)
Sodium: 127 mmol/L — ABNORMAL LOW (ref 135–145)

## 2022-10-23 LAB — CBC
HCT: 41.7 % (ref 36.0–46.0)
Hemoglobin: 14.8 g/dL (ref 12.0–15.0)
MCH: 34.7 pg — ABNORMAL HIGH (ref 26.0–34.0)
MCHC: 35.5 g/dL (ref 30.0–36.0)
MCV: 97.7 fL (ref 80.0–100.0)
Platelets: 312 10*3/uL (ref 150–400)
RBC: 4.27 MIL/uL (ref 3.87–5.11)
RDW: 12.1 % (ref 11.5–15.5)
WBC: 11.9 10*3/uL — ABNORMAL HIGH (ref 4.0–10.5)
nRBC: 0 % (ref 0.0–0.2)

## 2022-10-23 LAB — CK: Total CK: 61 U/L (ref 38–234)

## 2022-10-23 MED ORDER — ACETAMINOPHEN 325 MG PO TABS
650.0000 mg | ORAL_TABLET | ORAL | Status: AC
  Administered 2022-10-23: 650 mg via ORAL
  Filled 2022-10-23: qty 2

## 2022-10-23 MED ORDER — MORPHINE SULFATE (PF) 2 MG/ML IV SOLN
2.0000 mg | Freq: Once | INTRAVENOUS | Status: AC
  Administered 2022-10-23: 2 mg via INTRAVENOUS
  Filled 2022-10-23: qty 1

## 2022-10-23 MED ORDER — HYDRALAZINE HCL 25 MG PO TABS
25.0000 mg | ORAL_TABLET | Freq: Once | ORAL | Status: AC
  Administered 2022-10-23: 25 mg via ORAL
  Filled 2022-10-23: qty 1

## 2022-10-23 MED ORDER — ONDANSETRON HCL 4 MG/2ML IJ SOLN: 4.0000 mg | Freq: Four times a day (QID) | INTRAMUSCULAR | Status: DC | PRN

## 2022-10-23 MED ORDER — FOLIC ACID 1 MG PO TABS
1.0000 mg | ORAL_TABLET | Freq: Every day | ORAL | Status: DC
  Administered 2022-10-23 – 2022-11-06 (×14): 1 mg via ORAL
  Filled 2022-10-23 (×14): qty 1

## 2022-10-23 MED ORDER — LORAZEPAM 1 MG PO TABS: 1.0000 mg | ORAL_TABLET | ORAL | Status: AC | PRN

## 2022-10-23 MED ORDER — THIAMINE HCL 100 MG/ML IJ SOLN
100.0000 mg | Freq: Every day | INTRAMUSCULAR | Status: DC
  Administered 2022-10-30: 100 mg via INTRAVENOUS
  Filled 2022-10-23: qty 2

## 2022-10-23 MED ORDER — HYDROMORPHONE HCL 1 MG/ML IJ SOLN
0.5000 mg | INTRAMUSCULAR | Status: DC | PRN
  Administered 2022-10-30 (×2): 0.5 mg via INTRAVENOUS
  Filled 2022-10-23 (×2): qty 0.5

## 2022-10-23 MED ORDER — NICOTINE 14 MG/24HR TD PT24
14.0000 mg | MEDICATED_PATCH | Freq: Every day | TRANSDERMAL | Status: DC
  Administered 2022-10-30 – 2022-11-01 (×2): 14 mg via TRANSDERMAL
  Filled 2022-10-23 (×13): qty 1

## 2022-10-23 MED ORDER — THIAMINE MONONITRATE 100 MG PO TABS
100.0000 mg | ORAL_TABLET | Freq: Every day | ORAL | Status: DC
  Administered 2022-10-23 – 2022-11-06 (×14): 100 mg via ORAL
  Filled 2022-10-23 (×15): qty 1

## 2022-10-23 MED ORDER — ADULT MULTIVITAMIN W/MINERALS CH
1.0000 | ORAL_TABLET | Freq: Every day | ORAL | Status: DC
  Administered 2022-10-23 – 2022-11-06 (×14): 1 via ORAL
  Filled 2022-10-23 (×14): qty 1

## 2022-10-23 NOTE — H&P (Incomplete)
PCP:   Norm Salt, PA   Chief Complaint:  Fall  HPI: This is a pleasant 72 year old female with past medical history of alcohol abuse, tobacco abuse, HTN, COPD, mobility issues.  Yesterday she fell While sitting.  She was backing up to the sofa and a walker.  She believes she missed the seat and fell on the carpet.  She laid there for hours and in the early morning called her brother.  He assisted her to the couch where she slept.  Next morning she woke her foot and knee were swollen.  She found she could not get up.  She was taken to the ER.  In the ER imaging reveals left tibial plateau fracture and right metatarsal fractures.  Orthopedics consulted.  Review of Systems:  Per HPI  Past Medical History: Past Medical History:  Diagnosis Date   Acute kidney injury (HCC) 07/10/2017   Acute respiratory failure with hypoxia (HCC) 07/10/2017   Alcohol abuse    Allergy    Cataract    Diverticulitis 10/30/2011   Elevated troponin 07/10/2017   Hypertension    Lobar pneumonia (HCC) 07/10/2017   Pleural effusion on right 07/10/2017   Sepsis (HCC) 07/10/2017   Tobacco abuse    UTI (urinary tract infection) 07/10/2017   Vitamin D deficiency 10/30/2011   Past Surgical History:  Procedure Laterality Date   ABDOMINAL HYSTERECTOMY     CATARACT EXTRACTION     FRACTURE SURGERY     IR THORACENTESIS ASP PLEURAL SPACE W/IMG GUIDE  07/11/2017   VIDEO ASSISTED THORACOSCOPY (VATS)/EMPYEMA Right 07/12/2017   Procedure: right VIDEO ASSISTED THORACOSCOPY (VATS) for drainage of EMPYEMA and decortication;  Surgeon: Loreli Slot, MD;  Location: [facility] OR;  Service: Thoracic;  Laterality: Right;   VIDEO BRONCHOSCOPY N/A 07/12/2017   Procedure: VIDEO BRONCHOSCOPY;  Surgeon: Loreli Slot, MD;  Location: [facility] OR;  Service: Thoracic;  Laterality: N/A;    Medications: Prior to Admission medications   Medication Sig Start Date End Date Taking? Authorizing Provider  albuterol (PROVENTIL HFA;VENTOLIN HFA) 108  (90 Base) MCG/ACT inhaler Inhale 1-2 puffs into the lungs every 4 (four) hours as needed for shortness of breath. 07/10/17  Yes [provider]  EPINEPHrine (PRIMATENE MIST IN) Inhale 1 puff into the lungs daily as needed (congestion).   Yes [provider]  aspirin EC 81 MG tablet Take 1 tablet (81 mg total) by mouth daily. Patient not taking: Reported on 10/23/2022 12/06/17   Wendall Stade, MD  traMADol (ULTRAM) 50 MG tablet Take 1 tablet (50 mg total) by mouth every 6 (six) hours as needed. Patient not taking: Reported on 10/23/2022 08/06/17   Loreli Slot, MD    Allergies:   Allergies  Allergen Reactions   Dextromethorphan Other (See Comments)    Makes her very angry and she wants to hurt people    Social History:  reports that she has been smoking cigarettes. She has a 79.50 pack-year smoking history. She has never used smokeless tobacco. She reports current alcohol use of about 21.0 standard drinks of alcohol per week. She reports that she does not use drugs.  Family History: Family History  Problem Relation Age of Onset   Hypertension Mother    Heart disease Mother    Hypertension Father    Heart disease Father    Stroke Brother    Hypertension Brother    Heart disease Brother     Physical Exam: Vitals:   10/23/22 1701 10/23/22 1943  10/23/22 2053  BP: (!) 192/97 (!) 213/112 (!) 213/112  Pulse: (!) 114 (!) 104 (!) 104  Resp: 15 16   Temp: 98.1 F (36.7 C) 98 F (36.7 C)   TempSrc: Oral Oral   SpO2: 94% 92%     General:  Alert and oriented times three, looks older than stated age, frail, under nourished. Eyes: Pink conjunctiva, no scleral icterus ENT: Dry oral mucosa, neck supple,  Lungs: clear to ascultation, no wheeze, no crackles, no use of accessory muscles Cardiovascular: Tachycardia, regular rate and rhythm, no regurgitation, no gallops, no murmurs. No carotid bruits, no JVD Abdomen: soft, positive BS, non-tender, non-distended, no  organomegaly, not an acute abdomen GU: not examined Neuro: CN II - XII grossly intact, sensation intact Musculoskeletal: strength 4/5 RLE. No edema Skin: no rash, no subcutaneous crepitation, no decubitus Psych: appropriate patient  Labs on Admission:  Recent Labs    10/23/22 2027  NA 127*  K 4.1  CL 86*  CO2 29  GLUCOSE 135*  BUN 9  CREATININE 0.75  CALCIUM 9.0    Recent Labs    10/23/22 2027  WBC 11.9*  HGB 14.8  HCT 41.7  MCV 97.7  PLT 312   Recent Labs    10/23/22 2027  CKTOTAL 61    Radiological Exams on Admission: CT Foot Right Wo Contrast  Result Date: 10/23/2022 CLINICAL DATA:  Fall and right foot pain. EXAM: CT OF THE RIGHT FOOT WITHOUT CONTRAST TECHNIQUE: Multidetector CT imaging of the right foot was performed according to the standard protocol. Multiplanar CT image reconstructions were also generated. RADIATION DOSE REDUCTION: This exam was performed according to the departmental dose-optimization program which includes automated exposure control, adjustment of the mA and/or kV according to patient size and/or use of iterative reconstruction technique. COMPARISON:  Right foot radiograph dated 10/23/2022. FINDINGS: Bones/Joint/Cartilage Evaluation of the bones is very limited due to advanced osteoporosis. No acute fracture or dislocation. MRI or bone scan may provide better evaluation if there is high clinical concern for acute fracture. Prior internal fixation of the lateral and medial malleoli. Ligaments Suboptimally assessed by CT. Muscles and Tendons No acute findings. Soft tissues There is mild subcutaneous edema of the dorsum of the foot. IMPRESSION: 1. No acute fracture or dislocation. 2. Advanced osteoporosis. Electronically Signed   By: Elgie Collard M.D.   On: 10/23/2022 22:23   CT Knee Left Wo Contrast  Result Date: 10/23/2022 CLINICAL DATA:  Tibial plateau fracture EXAM: CT OF THE LEFT KNEE WITHOUT CONTRAST TECHNIQUE: Multidetector CT imaging of  the left knee was performed according to the standard protocol. Multiplanar CT image reconstructions were also generated. RADIATION DOSE REDUCTION: This exam was performed according to the departmental dose-optimization program which includes automated exposure control, adjustment of the mA and/or kV according to patient size and/or use of iterative reconstruction technique. COMPARISON:  None Available. FINDINGS: Bones/Joint/Cartilage The bones are diffusely osteopenic. There is an acute minimally depressed (3 mm) medial tibial plateau fracture. Acute fracture involves the posterior aspect of the tibial spines. This is nondisplaced. There is minimal medial and lateral compartment joint space narrowing with chondrocalcinosis. Large knee joint effusion is present with fat fluid levels. Ligaments Suboptimally assessed by CT. Muscles and Tendons Grossly intact, but well evaluated on noncontrast CT. Soft tissues There is mild subcutaneous edema surrounding the knee. Peripheral vascular calcifications are present. IMPRESSION: 1. Acute minimally depressed medial tibial plateau fracture. 2. Acute nondisplaced fracture of the posterior aspect of the tibial spines. 3.  Large lipohemarthrosis. Electronically Signed   By: Darliss Cheney M.D.   On: 10/23/2022 22:22   DG Foot Complete Left  Result Date: 10/23/2022 CLINICAL DATA:  Fall with foot pain EXAM: LEFT FOOT - COMPLETE 3+ VIEW COMPARISON:  None Available. FINDINGS: Osteopenia limits fracture assessment. Hallux valgus deformity at the first MTP joint with slight lateral subluxation base of first proximal phalanx. Mild degenerative change at the first MTP joint. Dorsal soft tissue swelling. IMPRESSION: Osteopenia limits fracture assessment. No definite acute osseous abnormality. Dorsal soft tissue swelling. Electronically Signed   By: Jasmine Pang M.D.   On: 10/23/2022 19:05   DG Foot Complete Right  Result Date: 10/23/2022 CLINICAL DATA:  Fall with pain EXAM: RIGHT  FOOT COMPLETE - 3+ VIEW COMPARISON:  None Available. FINDINGS: Bones appear osteopenic which limits fracture assessment. Surgical plate and fixating screws in the distal fibula with fixating screws at the distal tibia. No malalignment. Acute appearing fractures involving the necks of the right third and fourth metatarsals. IMPRESSION: Acute appearing fractures involving the necks of the right third and fourth metatarsals. Electronically Signed   By: Jasmine Pang M.D.   On: 10/23/2022 19:02   DG Knee Complete 4 Views Left  Result Date: 10/23/2022 CLINICAL DATA:  Fall with knee pain EXAM: LEFT KNEE - COMPLETE 4+ VIEW COMPARISON:  None Available. FINDINGS: Bones appear osteopenic. No malalignment. Moderate knee effusion. Acute nondisplaced intra-articular fracture involving the proximal tibia with lucency to the medial tibial plateau. IMPRESSION: 1. Acute nondisplaced intra-articular fracture involving the proximal tibia with lucency extending to the medial tibial plateau. 2. Moderate knee effusion. Electronically Signed   By: Jasmine Pang M.D.   On: 10/23/2022 19:01    Assessment/Plan Present on Admission:  Left tibial plateau fracture/right metatarsal fracture  Moderate left knee effusion/lipohemarthrosis -Orthopedic consult placed.  Will see patient in AM.  Recommendation keep patient n.p.o. at midnight, will decide if surgery needed when seen.   -Left lower extremity knee immobilizer per Ortho recommendation -Pain meds as needed -PT/OT consult -Likely SNF placement.  If not a surgical candidate, patient not safe for discharge home given fractures bilateral lower extremity.  Patient elderly, walker at baseline, left lower extremity knee immobilizer  Fall initial encounter -See above.   Alcohol abuse -Follow-up with [facility],  4 drinks per day.  No reports of seizures or withdrawals however patient does not stop drinking for a long time.  Her last drink was last night. -Patient high risk of DTs.   Even if nonsurgical candidate, patient needs detox prior to SNF placement. -CIWA protocol.  Librium scheduled, ativan PRN -Thiamine, folate, MVI -Add mag, phos levels -LFTs added to lab -D5 normal saline infusion w/ POCT checks   Hypertension uncontrolled -Patient has not been to the doctor in years, not on medication -Start metoprolol 25mg  BiD -Lopressor PRN added  COPD  -Nebs as needed ordered  Tobacco use -Nicotine patch declined  Hyponatremia -TSH, urine sodium plasma osmolality.  Urine sodium -Likely due to poor p.o. intake -BMP 8AM  Moriah Loughry 10/23/2022, 10:52 PM

## 2022-10-23 NOTE — Progress Notes (Signed)
Orthopedic Tech Progress Note Patient Details:  Erica Ball 01/12/1951 [id number]  Ortho Devices Type of Ortho Device: Knee Immobilizer, Postop shoe/boot Ortho Device/Splint Location: RLE Ortho Device/Splint Interventions: Ordered, Application, Adjustment   Post Interventions Patient Tolerated: Well  Al Decant 10/23/2022, 9:25 PM

## 2022-10-23 NOTE — ED Provider Notes (Signed)
[place] EMERGENCY DEPARTMENT AT [facility] Provider Note   CSN: [id number] Arrival date & time: 10/23/22  1638     History {Add pertinent medical, surgical, social history, OB history to HPI:1} Chief Complaint  Patient presents with   Fall   Knee Pain   Foot Pain    Erica Ball is a 72 y.o. female.  72 year old female with a history of COPD, tobacco use, alcohol abuse, and hypertension who presents to the emergency department with left knee pain and foot pain after a fall.  Patient reports that she was going to sit down and was to her seat and fell onto her left side.  Did not have any head strike or LOC.  Is not on any blood thinners.  Says that she was on the ground overnight because she did not want to wake her brother up.  Says that she has been having more frequent falls recently and walks with a walker.  Noticed significant knee swelling and since she is unable to bear weight she decided to come into the emergency department for evaluation.  Last drink of alcohol was last night.  Says that she is not sure exactly how much alcohol she typically drinks because she makes pictures of it.       Home Medications Prior to Admission medications   Medication Sig Start Date End Date Taking? Authorizing Provider  albuterol (PROVENTIL HFA;VENTOLIN HFA) 108 (90 Base) MCG/ACT inhaler Inhale 1-2 puffs into the lungs every 4 (four) hours as needed for shortness of breath. 07/10/17   [provider]  amLODipine (NORVASC) 10 MG tablet Take 10 mg by mouth daily.  09/14/17   [provider]  aspirin EC 81 MG tablet Take 1 tablet (81 mg total) by mouth daily. 12/06/17   Wendall Stade, MD  sulindac (CLINORIL) 200 MG tablet Take 200 mg by mouth 2 (two) times daily. 11/18/17   [provider]  SYMBICORT 80-4.5 MCG/ACT inhaler Inhale 2 puffs into the lungs 2 (two) times daily. 11/11/17   [provider]  traMADol (ULTRAM) 50 MG tablet Take 1 tablet (50  mg total) by mouth every 6 (six) hours as needed. 08/06/17   Loreli Slot, MD      Allergies    Dextromethorphan    Review of Systems   Review of Systems  Physical Exam Updated Vital Signs BP (!) 213/112 (BP Location: Left Arm)   Pulse (!) 104   Temp 98 F (36.7 C) (Oral)   Resp 16   SpO2 92%  Physical Exam  ED Results / Procedures / Treatments   Labs (all labs ordered are listed, but only abnormal results are displayed) Labs Reviewed  BASIC METABOLIC PANEL  CBC  CK    EKG None  Radiology DG Foot Complete Left  Result Date: 10/23/2022 CLINICAL DATA:  Fall with foot pain EXAM: LEFT FOOT - COMPLETE 3+ VIEW COMPARISON:  None Available. FINDINGS: Osteopenia limits fracture assessment. Hallux valgus deformity at the first MTP joint with slight lateral subluxation base of first proximal phalanx. Mild degenerative change at the first MTP joint. Dorsal soft tissue swelling. IMPRESSION: Osteopenia limits fracture assessment. No definite acute osseous abnormality. Dorsal soft tissue swelling. Electronically Signed   By: Jasmine Pang M.D.   On: 10/23/2022 19:05   DG Foot Complete Right  Result Date: 10/23/2022 CLINICAL DATA:  Fall with pain EXAM: RIGHT FOOT COMPLETE - 3+ VIEW COMPARISON:  None Available. FINDINGS: Bones appear osteopenic which limits  fracture assessment. Surgical plate and fixating screws in the distal fibula with fixating screws at the distal tibia. No malalignment. Acute appearing fractures involving the necks of the right third and fourth metatarsals. IMPRESSION: Acute appearing fractures involving the necks of the right third and fourth metatarsals. Electronically Signed   By: Jasmine Pang M.D.   On: 10/23/2022 19:02   DG Knee Complete 4 Views Left  Result Date: 10/23/2022 CLINICAL DATA:  Fall with knee pain EXAM: LEFT KNEE - COMPLETE 4+ VIEW COMPARISON:  None Available. FINDINGS: Bones appear osteopenic. No malalignment. Moderate knee effusion. Acute  nondisplaced intra-articular fracture involving the proximal tibia with lucency to the medial tibial plateau. IMPRESSION: 1. Acute nondisplaced intra-articular fracture involving the proximal tibia with lucency extending to the medial tibial plateau. 2. Moderate knee effusion. Electronically Signed   By: Jasmine Pang M.D.   On: 10/23/2022 19:01    Procedures Procedures  {Document cardiac monitor, telemetry assessment procedure when appropriate:1}  Medications Ordered in ED Medications - No data to display  ED Course/ Medical Decision Making/ A&P Clinical Course as of 10/23/22 2145  Tue Oct 23, 2022  1921 Acute appearing fractures involving the necks of the right third and fourth metatarsals. Proximal tibia fracture [RP]  2050 Dr Odis Hollingshead recommends CT scan of the foot and possibly of the knee as well. Knee imobilizer and boot or post op shoe for the metatarsal.  [RP]    Clinical Course User Index [RP] Rondel Baton, MD   {   Click here for ABCD2, HEART and other calculatorsREFRESH Note before signing :1}                          Medical Decision Making Amount and/or Complexity of Data Reviewed Labs: ordered. Radiology: ordered.  Risk OTC drugs. Prescription drug management. Decision regarding hospitalization.   ***  {Document critical care time when appropriate:1} {Document review of labs and clinical decision tools ie heart score, Chads2Vasc2 etc:1}  {Document your independent review of radiology images, and any outside records:1} {Document your discussion with family members, caretakers, and with consultants:1} {Document social determinants of health affecting pt's care:1} {Document your decision making why or why not admission, treatments were needed:1} Final Clinical Impression(s) / ED Diagnoses Final diagnoses:  None    Rx / DC Orders ED Discharge Orders     None

## 2022-10-23 NOTE — ED Triage Notes (Signed)
Pt c/o L knee and L foot pain r/t fall last night.  Pt reports she lost her balance and fell.  Pain score 10/10.    Pt c/o R foot pain from a previous fall.

## 2022-10-24 DIAGNOSIS — Y92009 Unspecified place in unspecified non-institutional (private) residence as the place of occurrence of the external cause: Secondary | ICD-10-CM

## 2022-10-24 DIAGNOSIS — E440 Moderate protein-calorie malnutrition: Secondary | ICD-10-CM | POA: Diagnosis not present

## 2022-10-24 DIAGNOSIS — S82142A Displaced bicondylar fracture of left tibia, initial encounter for closed fracture: Secondary | ICD-10-CM | POA: Diagnosis not present

## 2022-10-24 DIAGNOSIS — N390 Urinary tract infection, site not specified: Secondary | ICD-10-CM | POA: Diagnosis not present

## 2022-10-24 DIAGNOSIS — F10139 Alcohol abuse with withdrawal, unspecified: Secondary | ICD-10-CM | POA: Diagnosis not present

## 2022-10-24 DIAGNOSIS — Z823 Family history of stroke: Secondary | ICD-10-CM | POA: Diagnosis not present

## 2022-10-24 DIAGNOSIS — I6389 Other cerebral infarction: Secondary | ICD-10-CM | POA: Diagnosis not present

## 2022-10-24 DIAGNOSIS — R29810 Facial weakness: Secondary | ICD-10-CM | POA: Diagnosis not present

## 2022-10-24 DIAGNOSIS — R569 Unspecified convulsions: Secondary | ICD-10-CM | POA: Diagnosis not present

## 2022-10-24 DIAGNOSIS — Y92019 Unspecified place in single-family (private) house as the place of occurrence of the external cause: Secondary | ICD-10-CM | POA: Diagnosis not present

## 2022-10-24 DIAGNOSIS — Z90710 Acquired absence of both cervix and uterus: Secondary | ICD-10-CM | POA: Diagnosis not present

## 2022-10-24 DIAGNOSIS — R0689 Other abnormalities of breathing: Secondary | ICD-10-CM | POA: Diagnosis not present

## 2022-10-24 DIAGNOSIS — I4719 Other supraventricular tachycardia: Secondary | ICD-10-CM | POA: Diagnosis not present

## 2022-10-24 DIAGNOSIS — Y9301 Activity, walking, marching and hiking: Secondary | ICD-10-CM | POA: Diagnosis present

## 2022-10-24 DIAGNOSIS — J449 Chronic obstructive pulmonary disease, unspecified: Secondary | ICD-10-CM | POA: Diagnosis not present

## 2022-10-24 DIAGNOSIS — F1010 Alcohol abuse, uncomplicated: Secondary | ICD-10-CM | POA: Diagnosis not present

## 2022-10-24 DIAGNOSIS — I2489 Other forms of acute ischemic heart disease: Secondary | ICD-10-CM | POA: Diagnosis not present

## 2022-10-24 DIAGNOSIS — S92331A Displaced fracture of third metatarsal bone, right foot, initial encounter for closed fracture: Secondary | ICD-10-CM | POA: Diagnosis present

## 2022-10-24 DIAGNOSIS — G9341 Metabolic encephalopathy: Secondary | ICD-10-CM | POA: Diagnosis not present

## 2022-10-24 DIAGNOSIS — R54 Age-related physical debility: Secondary | ICD-10-CM | POA: Diagnosis present

## 2022-10-24 DIAGNOSIS — Z8673 Personal history of transient ischemic attack (TIA), and cerebral infarction without residual deficits: Secondary | ICD-10-CM | POA: Diagnosis not present

## 2022-10-24 DIAGNOSIS — E871 Hypo-osmolality and hyponatremia: Secondary | ICD-10-CM | POA: Diagnosis present

## 2022-10-24 DIAGNOSIS — I6381 Other cerebral infarction due to occlusion or stenosis of small artery: Secondary | ICD-10-CM | POA: Diagnosis not present

## 2022-10-24 DIAGNOSIS — R4182 Altered mental status, unspecified: Secondary | ICD-10-CM | POA: Diagnosis not present

## 2022-10-24 DIAGNOSIS — R471 Dysarthria and anarthria: Secondary | ICD-10-CM | POA: Diagnosis not present

## 2022-10-24 DIAGNOSIS — S8262XA Displaced fracture of lateral malleolus of left fibula, initial encounter for closed fracture: Secondary | ICD-10-CM | POA: Diagnosis not present

## 2022-10-24 DIAGNOSIS — Z6821 Body mass index (BMI) 21.0-21.9, adult: Secondary | ICD-10-CM | POA: Diagnosis not present

## 2022-10-24 DIAGNOSIS — I471 Supraventricular tachycardia, unspecified: Secondary | ICD-10-CM | POA: Diagnosis not present

## 2022-10-24 DIAGNOSIS — I10 Essential (primary) hypertension: Secondary | ICD-10-CM

## 2022-10-24 DIAGNOSIS — S82143A Displaced bicondylar fracture of unspecified tibia, initial encounter for closed fracture: Secondary | ICD-10-CM | POA: Insufficient documentation

## 2022-10-24 DIAGNOSIS — I63313 Cerebral infarction due to thrombosis of bilateral middle cerebral arteries: Secondary | ICD-10-CM | POA: Diagnosis not present

## 2022-10-24 DIAGNOSIS — M25562 Pain in left knee: Secondary | ICD-10-CM | POA: Diagnosis present

## 2022-10-24 DIAGNOSIS — E854 Organ-limited amyloidosis: Secondary | ICD-10-CM | POA: Diagnosis not present

## 2022-10-24 DIAGNOSIS — I680 Cerebral amyloid angiopathy: Secondary | ICD-10-CM | POA: Diagnosis present

## 2022-10-24 DIAGNOSIS — W1839XA Other fall on same level, initial encounter: Secondary | ICD-10-CM | POA: Diagnosis present

## 2022-10-24 DIAGNOSIS — W19XXXA Unspecified fall, initial encounter: Secondary | ICD-10-CM | POA: Diagnosis not present

## 2022-10-24 DIAGNOSIS — S92341A Displaced fracture of fourth metatarsal bone, right foot, initial encounter for closed fracture: Secondary | ICD-10-CM | POA: Diagnosis present

## 2022-10-24 DIAGNOSIS — F1011 Alcohol abuse, in remission: Secondary | ICD-10-CM | POA: Diagnosis not present

## 2022-10-24 DIAGNOSIS — I639 Cerebral infarction, unspecified: Secondary | ICD-10-CM | POA: Diagnosis not present

## 2022-10-24 LAB — OSMOLALITY: Osmolality: 269 mOsm/kg — ABNORMAL LOW (ref 275–295)

## 2022-10-24 LAB — PROTIME-INR
INR: 1.1 (ref 0.8–1.2)
Prothrombin Time: 14.3 seconds (ref 11.4–15.2)

## 2022-10-24 LAB — CBC WITH DIFFERENTIAL/PLATELET
Abs Immature Granulocytes: 0.02 10*3/uL (ref 0.00–0.07)
Basophils Absolute: 0.1 10*3/uL (ref 0.0–0.1)
Basophils Relative: 1 %
Eosinophils Absolute: 0 10*3/uL (ref 0.0–0.5)
Eosinophils Relative: 0 %
HCT: 38.7 % (ref 36.0–46.0)
Hemoglobin: 13.6 g/dL (ref 12.0–15.0)
Immature Granulocytes: 0 %
Lymphocytes Relative: 10 %
Lymphs Abs: 1 10*3/uL (ref 0.7–4.0)
MCH: 34.5 pg — ABNORMAL HIGH (ref 26.0–34.0)
MCHC: 35.1 g/dL (ref 30.0–36.0)
MCV: 98.2 fL (ref 80.0–100.0)
Monocytes Absolute: 0.9 10*3/uL (ref 0.1–1.0)
Monocytes Relative: 10 %
Neutro Abs: 7.7 10*3/uL (ref 1.7–7.7)
Neutrophils Relative %: 79 %
Platelets: 275 10*3/uL (ref 150–400)
RBC: 3.94 MIL/uL (ref 3.87–5.11)
RDW: 12 % (ref 11.5–15.5)
WBC: 9.7 10*3/uL (ref 4.0–10.5)
nRBC: 0 % (ref 0.0–0.2)

## 2022-10-24 LAB — COMPREHENSIVE METABOLIC PANEL
ALT: 10 U/L (ref 0–44)
AST: 14 U/L — ABNORMAL LOW (ref 15–41)
Albumin: 3.4 g/dL — ABNORMAL LOW (ref 3.5–5.0)
Alkaline Phosphatase: 66 U/L (ref 38–126)
Anion gap: 9 (ref 5–15)
BUN: 8 mg/dL (ref 8–23)
CO2: 30 mmol/L (ref 22–32)
Calcium: 8.6 mg/dL — ABNORMAL LOW (ref 8.9–10.3)
Chloride: 86 mmol/L — ABNORMAL LOW (ref 98–111)
Creatinine, Ser: 0.57 mg/dL (ref 0.44–1.00)
GFR, Estimated: >60 mL/min (ref >60)
Glucose, Bld: 119 mg/dL — ABNORMAL HIGH (ref 70–99)
Potassium: 3.6 mmol/L (ref 3.5–5.1)
Sodium: 125 mmol/L — ABNORMAL LOW (ref 135–145)
Total Bilirubin: 1.2 mg/dL (ref 0.3–1.2)
Total Protein: 6.1 g/dL — ABNORMAL LOW (ref 6.5–8.1)

## 2022-10-24 LAB — MAGNESIUM: Magnesium: 1.4 mg/dL — ABNORMAL LOW (ref 1.7–2.4)

## 2022-10-24 LAB — HEMOGLOBIN A1C
Hgb A1c MFr Bld: 4.7 % — ABNORMAL LOW (ref 4.8–5.6)
Mean Plasma Glucose: 88.19 mg/dL

## 2022-10-24 LAB — PHOSPHORUS: Phosphorus: 2.9 mg/dL (ref 2.5–4.6)

## 2022-10-24 LAB — GLUCOSE, CAPILLARY
Glucose-Capillary: 122 mg/dL — ABNORMAL HIGH (ref 70–99)
Glucose-Capillary: 103 mg/dL — ABNORMAL HIGH (ref 70–99)
Glucose-Capillary: 112 mg/dL — ABNORMAL HIGH (ref 70–99)
Glucose-Capillary: 122 mg/dL — ABNORMAL HIGH (ref 70–99)

## 2022-10-24 LAB — TSH: TSH: 1.787 u[IU]/mL (ref 0.350–4.500)

## 2022-10-24 MED ORDER — INSULIN ASPART 100 UNIT/ML IJ SOLN: 0.0000 [IU] | Freq: Three times a day (TID) | INTRAMUSCULAR | Status: DC

## 2022-10-24 MED ORDER — CHLORDIAZEPOXIDE HCL 25 MG PO CAPS
25.0000 mg | ORAL_CAPSULE | Freq: Four times a day (QID) | ORAL | Status: AC
  Administered 2022-10-24 (×4): 25 mg via ORAL
  Filled 2022-10-24 (×4): qty 1

## 2022-10-24 MED ORDER — CHLORDIAZEPOXIDE HCL 25 MG PO CAPS
25.0000 mg | ORAL_CAPSULE | ORAL | Status: DC
  Administered 2022-10-25: 25 mg via ORAL

## 2022-10-24 MED ORDER — ONDANSETRON HCL 4 MG PO TABS: 4.0000 mg | ORAL_TABLET | Freq: Four times a day (QID) | ORAL | Status: DC | PRN

## 2022-10-24 MED ORDER — POLYETHYLENE GLYCOL 3350 17 G PO PACK: 17.0000 g | PACK | Freq: Every day | ORAL | Status: DC

## 2022-10-24 MED ORDER — DOCUSATE SODIUM 100 MG PO CAPS: 100.0000 mg | ORAL_CAPSULE | Freq: Two times a day (BID) | ORAL | Status: DC

## 2022-10-24 MED ORDER — INSULIN ASPART 100 UNIT/ML IJ SOLN: 0.0000 [IU] | Freq: Four times a day (QID) | INTRAMUSCULAR | Status: DC

## 2022-10-24 MED ORDER — ENOXAPARIN SODIUM 40 MG/0.4ML IJ SOSY
40.0000 mg | PREFILLED_SYRINGE | INTRAMUSCULAR | Status: DC
  Administered 2022-10-24 – 2022-11-06 (×14): 40 mg via SUBCUTANEOUS
  Filled 2022-10-24 (×15): qty 0.4

## 2022-10-24 MED ORDER — CHLORDIAZEPOXIDE HCL 25 MG PO CAPS
25.0000 mg | ORAL_CAPSULE | Freq: Three times a day (TID) | ORAL | Status: AC
  Administered 2022-10-25 (×3): 25 mg via ORAL
  Filled 2022-10-24 (×3): qty 1

## 2022-10-24 MED ORDER — SENNOSIDES-DOCUSATE SODIUM 8.6-50 MG PO TABS
1.0000 | ORAL_TABLET | Freq: Every evening | ORAL | Status: DC | PRN
  Filled 2022-10-24: qty 1

## 2022-10-24 MED ORDER — METOPROLOL TARTRATE 25 MG PO TABS
25.0000 mg | ORAL_TABLET | Freq: Two times a day (BID) | ORAL | Status: DC
  Administered 2022-10-24 – 2022-10-28 (×11): 25 mg via ORAL
  Filled 2022-10-24 (×11): qty 1

## 2022-10-24 MED ORDER — HYDROXYZINE HCL 25 MG PO TABS: 25.0000 mg | ORAL_TABLET | Freq: Four times a day (QID) | ORAL | Status: AC | PRN

## 2022-10-24 MED ORDER — KCL IN DEXTROSE-NACL 20-5-0.9 MEQ/L-%-% IV SOLN
INTRAVENOUS | Status: DC
  Filled 2022-10-24: qty 1000

## 2022-10-24 MED ORDER — ONDANSETRON HCL 4 MG/2ML IJ SOLN: 4.0000 mg | Freq: Four times a day (QID) | INTRAMUSCULAR | Status: DC | PRN

## 2022-10-24 MED ORDER — IPRATROPIUM-ALBUTEROL 0.5-2.5 (3) MG/3ML IN SOLN: 3.0000 mL | RESPIRATORY_TRACT | Status: DC | PRN

## 2022-10-24 MED ORDER — METOPROLOL TARTRATE 5 MG/5ML IV SOLN
5.0000 mg | INTRAVENOUS | Status: DC | PRN
  Administered 2022-10-26 – 2022-11-01 (×7): 5 mg via INTRAVENOUS
  Filled 2022-10-24 (×8): qty 5

## 2022-10-24 MED ORDER — CHLORDIAZEPOXIDE HCL 25 MG PO CAPS
25.0000 mg | ORAL_CAPSULE | Freq: Once | ORAL | Status: AC
  Administered 2022-10-24: 25 mg via ORAL
  Filled 2022-10-24: qty 1

## 2022-10-24 MED ORDER — ENSURE ENLIVE PO LIQD
237.0000 mL | Freq: Two times a day (BID) | ORAL | Status: DC
  Administered 2022-10-24 – 2022-10-29 (×11): 237 mL via ORAL

## 2022-10-24 MED ORDER — POTASSIUM CHLORIDE IN NACL 20-0.9 MEQ/L-% IV SOLN
INTRAVENOUS | Status: DC
  Filled 2022-10-24: qty 1000

## 2022-10-24 MED ORDER — LOPERAMIDE HCL 2 MG PO CAPS: 2.0000 mg | ORAL_CAPSULE | ORAL | Status: AC | PRN

## 2022-10-24 MED ORDER — CHLORDIAZEPOXIDE HCL 25 MG PO CAPS: 25.0000 mg | ORAL_CAPSULE | Freq: Every day | ORAL | Status: DC

## 2022-10-24 MED ORDER — HYDRALAZINE HCL 25 MG PO TABS
25.0000 mg | ORAL_TABLET | Freq: Three times a day (TID) | ORAL | Status: DC
  Administered 2022-10-24 – 2022-10-26 (×8): 25 mg via ORAL
  Filled 2022-10-24 (×8): qty 1

## 2022-10-24 MED ORDER — MAGNESIUM SULFATE 2 GM/50ML IV SOLN
2.0000 g | Freq: Once | INTRAVENOUS | Status: AC
  Administered 2022-10-24: 2 g via INTRAVENOUS
  Filled 2022-10-24: qty 50

## 2022-10-24 NOTE — ED Notes (Signed)
ED TO INPATIENT HANDOFF REPORT  ED Nurse Name and Phone #:   Gillis Ends #[id number]  S Name/Age/Gender Erica Ball 72 y.o. female Room/Bed: 041C/041C  Code Status   Code Status: Full Code  Home/SNF/Other Rehab Patient oriented to: self, place, time, and situation Is this baseline? Yes   Triage Complete: Triage complete  Chief Complaint Tibial plateau fracture [S82.143A]  Triage Note Pt c/o L knee and L foot pain r/t fall last night.  Pt reports she lost her balance and fell.  Pain score 10/10.    Pt c/o R foot pain from a previous fall.     Allergies Allergies  Allergen Reactions   Dextromethorphan Other (See Comments)    Makes her very angry and she wants to hurt people    Level of Care/Admitting Diagnosis ED Disposition     ED Disposition  Admit   Condition  --   Comment  Hospital Area: [facility] [[id number]]  Level of Care: Telemetry Medical [104]  May admit patient to [facility] or [facility] if equivalent level of care is available:: Yes  Covid Evaluation: Confirmed COVID Negative  Diagnosis: Tibial plateau fracture [119147]  Admitting Physician: Gery Pray [[id number]]  Attending Physician: Gery Pray [[id number]]  Certification:: I certify this patient will need inpatient services for at least 2 midnights  Estimated Length of Stay: 2          B Medical/Surgery History Past Medical History:  Diagnosis Date   Acute kidney injury (HCC) 07/10/2017   Acute respiratory failure with hypoxia (HCC) 07/10/2017   Alcohol abuse    Allergy    Cataract    Diverticulitis 10/30/2011   Elevated troponin 07/10/2017   Hypertension    Lobar pneumonia (HCC) 07/10/2017   Pleural effusion on right 07/10/2017   Sepsis (HCC) 07/10/2017   Tobacco abuse    UTI (urinary tract infection) 07/10/2017   Vitamin D deficiency 10/30/2011   Past Surgical History:  Procedure Laterality Date   ABDOMINAL HYSTERECTOMY     CATARACT EXTRACTION     FRACTURE SURGERY     IR  THORACENTESIS ASP PLEURAL SPACE W/IMG GUIDE  07/11/2017   VIDEO ASSISTED THORACOSCOPY (VATS)/EMPYEMA Right 07/12/2017   Procedure: right VIDEO ASSISTED THORACOSCOPY (VATS) for drainage of EMPYEMA and decortication;  Surgeon: Loreli Slot, MD;  Location: [facility] OR;  Service: Thoracic;  Laterality: Right;   VIDEO BRONCHOSCOPY N/A 07/12/2017   Procedure: VIDEO BRONCHOSCOPY;  Surgeon: Loreli Slot, MD;  Location: [facility] OR;  Service: Thoracic;  Laterality: N/A;     A IV Location/Drains/Wounds Patient Lines/Drains/Airways Status     Active Line/Drains/Airways     Name Placement date Placement time Site Days   Peripheral IV 10/23/22 20 G Anterior;Left;Proximal Forearm 10/23/22  2027  Forearm  1   Incision (Closed) 07/12/17 Chest Right 07/12/17  1225  -- 1930            Intake/Output Last 24 hours No intake or output data in the 24 hours ending 10/24/22 0045  Labs/Imaging Results for orders placed or performed during the hospital encounter of 10/23/22 (from the past 48 hour(s))  Basic metabolic panel     Status: Abnormal   Collection Time: 10/23/22  8:27 PM  Result Value Ref Range   Sodium 127 (L) 135 - 145 mmol/L   Potassium 4.1 3.5 - 5.1 mmol/L    Comment: HEMOLYSIS AT THIS LEVEL MAY AFFECT RESULT   Chloride 86 (L) 98 - 111 mmol/L   CO2  29 22 - 32 mmol/L   Glucose, Bld 135 (H) 70 - 99 mg/dL    Comment: Glucose reference range applies only to samples taken after fasting for at least 8 hours.   BUN 9 8 - 23 mg/dL   Creatinine, Ser 0.96 0.44 - 1.00 mg/dL   Calcium 9.0 8.9 - 04.5 mg/dL   GFR, Estimated >40 >98 mL/min    Comment: (NOTE) Calculated using the CKD-EPI Creatinine Equation (2021)    Anion gap 12 5 - 15    Comment: Performed at [facility], [address] [place], [place] [address]  CBC     Status: Abnormal   Collection Time: 10/23/22  8:27 PM  Result Value Ref Range   WBC 11.9 (H) 4.0 - 10.5 K/uL   RBC 4.27 3.87 - 5.11 MIL/uL   Hemoglobin 14.8 12.0 -  15.0 g/dL   HCT 78.2 95.6 - 21.3 %   MCV 97.7 80.0 - 100.0 fL   MCH 34.7 (H) 26.0 - 34.0 pg   MCHC 35.5 30.0 - 36.0 g/dL   RDW 08.6 57.8 - 46.9 %   Platelets 312 150 - 400 K/uL   nRBC 0.0 0.0 - 0.2 %    Comment: Performed at [facility], [address] [place], [place] [address]  CK     Status: None   Collection Time: 10/23/22  8:27 PM  Result Value Ref Range   Total CK 61 38 - 234 U/L    Comment: HEMOLYSIS AT THIS LEVEL MAY AFFECT RESULT Performed at [facility], [address] [place], [place] [address]    CT Foot Right Wo Contrast  Result Date: 10/23/2022 CLINICAL DATA:  Fall and right foot pain. EXAM: CT OF THE RIGHT FOOT WITHOUT CONTRAST TECHNIQUE: Multidetector CT imaging of the right foot was performed according to the standard protocol. Multiplanar CT image reconstructions were also generated. RADIATION DOSE REDUCTION: This exam was performed according to the departmental dose-optimization program which includes automated exposure control, adjustment of the mA and/or kV according to patient size and/or use of iterative reconstruction technique. COMPARISON:  Right foot radiograph dated 10/23/2022. FINDINGS: Bones/Joint/Cartilage Evaluation of the bones is very limited due to advanced osteoporosis. No acute fracture or dislocation. MRI or bone scan may provide better evaluation if there is high clinical concern for acute fracture. Prior internal fixation of the lateral and medial malleoli. Ligaments Suboptimally assessed by CT. Muscles and Tendons No acute findings. Soft tissues There is mild subcutaneous edema of the dorsum of the foot. IMPRESSION: 1. No acute fracture or dislocation. 2. Advanced osteoporosis. Electronically Signed   By: Elgie Collard M.D.   On: 10/23/2022 22:23   CT Knee Left Wo Contrast  Result Date: 10/23/2022 CLINICAL DATA:  Tibial plateau fracture EXAM: CT OF THE LEFT KNEE WITHOUT CONTRAST TECHNIQUE: Multidetector CT imaging of the left knee was  performed according to the standard protocol. Multiplanar CT image reconstructions were also generated. RADIATION DOSE REDUCTION: This exam was performed according to the departmental dose-optimization program which includes automated exposure control, adjustment of the mA and/or kV according to patient size and/or use of iterative reconstruction technique. COMPARISON:  None Available. FINDINGS: Bones/Joint/Cartilage The bones are diffusely osteopenic. There is an acute minimally depressed (3 mm) medial tibial plateau fracture. Acute fracture involves the posterior aspect of the tibial spines. This is nondisplaced. There is minimal medial and lateral compartment joint space narrowing with chondrocalcinosis. Large knee joint effusion is present with fat  fluid levels. Ligaments Suboptimally assessed by CT. Muscles and Tendons Grossly intact, but well evaluated on noncontrast CT. Soft tissues There is mild subcutaneous edema surrounding the knee. Peripheral vascular calcifications are present. IMPRESSION: 1. Acute minimally depressed medial tibial plateau fracture. 2. Acute nondisplaced fracture of the posterior aspect of the tibial spines. 3. Large lipohemarthrosis. Electronically Signed   By: Darliss Cheney M.D.   On: 10/23/2022 22:22   DG Foot Complete Left  Result Date: 10/23/2022 CLINICAL DATA:  Fall with foot pain EXAM: LEFT FOOT - COMPLETE 3+ VIEW COMPARISON:  None Available. FINDINGS: Osteopenia limits fracture assessment. Hallux valgus deformity at the first MTP joint with slight lateral subluxation base of first proximal phalanx. Mild degenerative change at the first MTP joint. Dorsal soft tissue swelling. IMPRESSION: Osteopenia limits fracture assessment. No definite acute osseous abnormality. Dorsal soft tissue swelling. Electronically Signed   By: Jasmine Pang M.D.   On: 10/23/2022 19:05   DG Foot Complete Right  Result Date: 10/23/2022 CLINICAL DATA:  Fall with pain EXAM: RIGHT FOOT COMPLETE - 3+  VIEW COMPARISON:  None Available. FINDINGS: Bones appear osteopenic which limits fracture assessment. Surgical plate and fixating screws in the distal fibula with fixating screws at the distal tibia. No malalignment. Acute appearing fractures involving the necks of the right third and fourth metatarsals. IMPRESSION: Acute appearing fractures involving the necks of the right third and fourth metatarsals. Electronically Signed   By: Jasmine Pang M.D.   On: 10/23/2022 19:02   DG Knee Complete 4 Views Left  Result Date: 10/23/2022 CLINICAL DATA:  Fall with knee pain EXAM: LEFT KNEE - COMPLETE 4+ VIEW COMPARISON:  None Available. FINDINGS: Bones appear osteopenic. No malalignment. Moderate knee effusion. Acute nondisplaced intra-articular fracture involving the proximal tibia with lucency to the medial tibial plateau. IMPRESSION: 1. Acute nondisplaced intra-articular fracture involving the proximal tibia with lucency extending to the medial tibial plateau. 2. Moderate knee effusion. Electronically Signed   By: Jasmine Pang M.D.   On: 10/23/2022 19:01    Pending Labs Unresulted Labs (From admission, onward)     Start     Ordered   10/31/22 0500  Creatinine, serum  (enoxaparin (LOVENOX)    CrCl >/= 30 ml/min)  Weekly,   R     Comments: while on enoxaparin therapy    10/24/22 0027   10/24/22 0500  Comprehensive metabolic panel  Tomorrow morning,   R        10/24/22 0026   10/24/22 0029  Hemoglobin A1c  Once,   R       Comments: To assess prior glycemic control    10/24/22 0029   10/24/22 0028  Magnesium  Once,   R        10/24/22 0027   10/24/22 0028  Phosphorus  Once,   R        10/24/22 0027   10/24/22 0028  Protime-INR  Once,   R        10/24/22 0027   10/24/22 0028  CBC with Differential/Platelet  Once,   R        10/24/22 0027   10/24/22 0027  CBC  (enoxaparin (LOVENOX)    CrCl >/= 30 ml/min)  Once,   R       Comments: Baseline for enoxaparin therapy IF NOT ALREADY DRAWN.  Notify MD if  PLT < 100 K.    10/24/22 0027   10/24/22 0027  Creatinine, serum  (enoxaparin (LOVENOX)    CrCl >/=  30 ml/min)  Once,   R       Comments: Baseline for enoxaparin therapy IF NOT ALREADY DRAWN.    10/24/22 0027            Vitals/Pain Today's Vitals   10/23/22 2053 10/23/22 2345 10/23/22 2347 10/23/22 2349  BP: (!) 213/112   (!) 157/83  Pulse: (!) 104 86  86  Resp:      Temp:      TempSrc:      SpO2:  94%  95%  PainSc:   3      Isolation Precautions No active isolations  Medications Medications  LORazepam (ATIVAN) tablet 1-4 mg (has no administration in time range)  thiamine (VITAMIN B1) tablet 100 mg (100 mg Oral Given 10/23/22 2101)    Or  thiamine (VITAMIN B1) injection 100 mg ( Intravenous See Alternative 10/23/22 2101)  folic acid (FOLVITE) tablet 1 mg (1 mg Oral Given 10/23/22 2101)  multivitamin with minerals tablet 1 tablet (1 tablet Oral Given 10/23/22 2102)  nicotine (NICODERM CQ - dosed in mg/24 hours) patch 14 mg (has no administration in time range)  HYDROmorphone (DILAUDID) injection 0.5 mg (has no administration in time range)  metoprolol tartrate (LOPRESSOR) tablet 25 mg (has no administration in time range)  metoprolol tartrate (LOPRESSOR) injection 5 mg (has no administration in time range)  loperamide (IMODIUM) capsule 2-4 mg (has no administration in time range)  hydrOXYzine (ATARAX) tablet 25 mg (has no administration in time range)  chlordiazePOXIDE (LIBRIUM) capsule 25 mg (has no administration in time range)  chlordiazePOXIDE (LIBRIUM) capsule 25 mg (has no administration in time range)    Followed by  chlordiazePOXIDE (LIBRIUM) capsule 25 mg (has no administration in time range)    Followed by  chlordiazePOXIDE (LIBRIUM) capsule 25 mg (has no administration in time range)    Followed by  chlordiazePOXIDE (LIBRIUM) capsule 25 mg (has no administration in time range)  enoxaparin (LOVENOX) injection 40 mg (has no administration in time range)   senna-docusate (Senokot-S) tablet 1 tablet (has no administration in time range)  ondansetron (ZOFRAN) tablet 4 mg (has no administration in time range)    Or  ondansetron (ZOFRAN) injection 4 mg (has no administration in time range)  insulin aspart (novoLOG) injection 0-9 Units (has no administration in time range)  hydrALAZINE (APRESOLINE) tablet 25 mg (25 mg Oral Given 10/23/22 2104)  morphine (PF) 2 MG/ML injection 2 mg (2 mg Intravenous Given 10/23/22 2104)  acetaminophen (TYLENOL) tablet 650 mg (650 mg Oral Given 10/23/22 2101)    Mobility non-ambulatory     Focused Assessments     R Recommendations: See Admitting Provider Note  Report given to:   Additional Notes:

## 2022-10-24 NOTE — Assessment & Plan Note (Signed)
Counseling was provided. -Patient apparently declined nicotine patch

## 2022-10-24 NOTE — Hospital Course (Addendum)
Taken from H&P.  72 year old female with past medical history of alcohol abuse, tobacco abuse, HTN, COPD, mobility issues came to ED after having a fall while trying to sit in a sofa, she believes she missed the seat and fell on the carpet.  She laid there for hours and early morning called her brother who helped her get up.  Next morning when she woke up she had her foot and knee swollen and she was unable to bear weight.  In the ER imaging reveals left tibial plateau fracture and right metatarsal fractures. Orthopedics consulted.   6/19: Vital stable.  Labs pertinent for worsening hyponatremia with sodium at 125, seems chronic, CBC stable.  Hypomagnesemia with magnesium at 1.4 which is being repleted.  A1c of 4.7.  Patient was on D5, IV fluid was discontinued.  Hyponatremia labs ordered Orthopedic surgery is recommending conservative management with knee immobilizer and nonweightbearing on left lower extremity.  She will be weightbearing as tolerated on right foot.  Pending PT/OT evaluation  6/20: Blood pressure elevated.  Stable hyponatremia at 125, hyponatremia labs with hypotonic hyponatremia with decreased urine and serum osmolality, urine sodium at 15. Giving a trial of normal saline.  Might need salt tablet. PT is recommending SNF

## 2022-10-24 NOTE — Assessment & Plan Note (Signed)
No acute exacerbation.  -Continue with as needed bronchodilator

## 2022-10-24 NOTE — Assessment & Plan Note (Signed)
Secondary to mechanical fall.  Orthopedic surgery was consulted and they were recommending conservative management with left knee immobilizer and NWB on left, patient will be weightbearing as tolerated on right. -Continue with pain management -PT/OT evaluation

## 2022-10-24 NOTE — Assessment & Plan Note (Signed)
Seems chronic with little worsening today, patient was on D5.  Likely secondary to heavy alcohol use. -Hyponatremia lab -Stop IV fluid -Follow-up labs and sodium

## 2022-10-24 NOTE — Assessment & Plan Note (Signed)
Patient drinks heavily daily.  Denies any prior withdrawals or seizures. Counseling was provided -CIWA protocol

## 2022-10-24 NOTE — Progress Notes (Signed)
Progress Note   Patient: Erica Ball:[id number] DOB: 01-Jan-1951 DOA: 10/23/2022     0 DOS: the patient was seen and examined on 10/24/2022   Brief hospital course: Taken from H&P.  72 year old female with past medical history of alcohol abuse, tobacco abuse, HTN, COPD, mobility issues came to ED after having a fall while trying to sit in a sofa, she believes she missed the seat and fell on the carpet.  She laid there for hours and early morning called her brother who helped her get up.  Next morning when she woke up she had her foot and knee swollen and she was unable to bear weight.  In the ER imaging reveals left tibial plateau fracture and right metatarsal fractures. Orthopedics consulted.   6/19: Vital stable.  Labs pertinent for worsening hyponatremia with sodium at 125, seems chronic, CBC stable.  Hypomagnesemia with magnesium at 1.4 which is being repleted.  A1c of 4.7.  Patient was on D5, IV fluid was discontinued.  Hyponatremia labs ordered Orthopedic surgery is recommending conservative management with knee immobilizer and nonweightbearing on left lower extremity.  She will be weightbearing as tolerated on right foot.  Pending PT/OT evaluation     Assessment and Plan: * Tibial plateau fracture, left, closed, initial encounter Secondary to mechanical fall.  Orthopedic surgery was consulted and they were recommending conservative management with left knee immobilizer and NWB on left, patient will be weightbearing as tolerated on right. -Continue with pain management -PT/OT evaluation  Hyponatremia Seems chronic with little worsening today, patient was on D5.  Likely secondary to heavy alcohol use. -Hyponatremia lab -Stop IV fluid -Follow-up labs and sodium  Alcohol abuse Patient drinks heavily daily.  Denies any prior withdrawals or seizures. Counseling was provided -CIWA protocol  Hypertension Blood pressure was elevated.  She was not getting any medications at  home as she has not seen a physician in years.  Started on hydralazine and metoprolol -Continue hydralazine and metoprolol   COPD GOLD 0 / still smoking  No acute exacerbation.  -Continue with as needed bronchodilator  Fall at home, initial encounter -PT/OT evaluation -Fall precautions  Tobacco abuse Counseling was provided. -Patient apparently declined nicotine patch  Hypomagnesemia Magnesium at 1.4. -Replete magnesium and monitor      Subjective: Patient continued to have pain in left knee.  Per patient when she started drinking she just cannot stop.  Denies any prior withdrawal symptoms.  Physical Exam: Vitals:   10/24/22 0720 10/24/22 1130 10/24/22 1220 10/24/22 1350  BP: (!) 161/76 130/83 128/71 (!) 154/81  Pulse: 71 68 60   Resp: 17 18 20    Temp: 98.3 F (36.8 C)  98 F (36.7 C)   TempSrc: Oral  Oral   SpO2: 93%  96%   Weight:      Height:       General.  Frail and malnourished lady, in no acute distress. Pulmonary.  Lungs clear bilaterally, normal respiratory effort. CV.  Regular rate and rhythm, no JVD, rub or murmur. Abdomen.  Soft, nontender, nondistended, BS positive. CNS.  Alert and oriented .  No focal neurologic deficit. Extremities.  1+ LLE or edema, left knee with immobilizer. Psychiatry.  Judgment and insight appears normal.   Data Reviewed: Prior data reviewed  Family Communication: Talked with DIL on phone  Disposition: Status is: Inpatient Remains inpatient appropriate because: Severity of illness  Planned Discharge Destination: Skilled nursing facility  DVT prophylaxis.  Lovenox Time spent: 50 minutes  This record  has been created using Conservation officer, historic buildings. Errors have been sought and corrected,but may not always be located. Such creation errors do not reflect on the standard of care.   Author: Arnetha Courser, MD 10/24/2022 1:58 PM  For on call review [email or web address].

## 2022-10-24 NOTE — Progress Notes (Signed)
Initial Nutrition Assessment  DOCUMENTATION CODES:   Not applicable  INTERVENTION:  Continue Regular diet as ordered to optimize nutritional intake Ensure Enlive po BID, each supplement provides 350 kcal and 20 grams of protein. MVI with minerals daily  NUTRITION DIAGNOSIS:   Increased nutrient needs related to other (see comment) (tibial plateau fracture) as evidenced by estimated needs.  GOAL:   Patient will meet greater than or equal to 90% of their needs  MONITOR:   PO intake, Supplement acceptance, Labs, Weight trends  REASON FOR ASSESSMENT:   Consult, Malnutrition Screening Tool Assessment of nutrition requirement/status  ASSESSMENT:   Pt fell while attempting to sit down, leading to L tibial plateau fracture and R metatarsal fractures. PMH significant for EtOH use, tobacco use, HTN, COPD, mobility issues.  Per Ortho, plans for non-operative management.   RD working remotely. Unsuccessful attempt to reach pt via phone call to room.  No documented meal completions on file to review. Reached out to RN regarding pt's PO intake. Reports that she ate breakfast and is doing well.   Pt screened for MST, qill add a nutrition supplement to optimize nutritional status and obtain additional nutrition history on follow up as able.   Unfortunately, there is no weight history on file to review since 2019. Question whether weight on file is pulled from last weight in 2019 versus actual measurement. Will continue to monitor throughout admission.   Medications: folvite, SSI 0-9 units q6h, MVI, thiamine  Labs: sodium 125, Mg 1.4, AST 14  NUTRITION - FOCUSED PHYSICAL EXAM: RD working remotely. Deferred to follow up.   Diet Order:   Diet Order             Diet regular Fluid consistency: Thin  Diet effective now                   EDUCATION NEEDS:   No education needs have been identified at this time  Skin:  Skin Assessment: Reviewed RN Assessment  Last BM:   unknown  Height:   Ht Readings from Last 1 Encounters:  10/24/22 5\' 8"  (1.727 m)    Weight:   Wt Readings from Last 1 Encounters:  10/24/22 63.5 kg   BMI:  Body mass index is 21.29 kg/m.  Estimated Nutritional Needs:   Kcal:  1600-1800  Protein:  80-95g  Fluid:  >/=1.6L  Drusilla Kanner, RDN, LDN Clinical Nutrition

## 2022-10-24 NOTE — Assessment & Plan Note (Signed)
Magnesium at 1.4. -Replete magnesium and monitor

## 2022-10-24 NOTE — Consult Note (Signed)
Reason for Consult:Left tibia plateau fx Referring Physician: Arnetha Courser Time called: 0730 Time at bedside: 0855   Erica Ball is an 72 y.o. female.  HPI: Yona fell at home yesterday while she was trying to sit down. She had immediate left knee pain and could not get up. She was brought to the ED where x-rays showed a tibia plateau fx and orthopedic surgery was consulted. She typically ambulates with a RW.  Past Medical History:  Diagnosis Date   Acute kidney injury (HCC) 07/10/2017   Acute respiratory failure with hypoxia (HCC) 07/10/2017   Alcohol abuse    Allergy    Cataract    Diverticulitis 10/30/2011   Elevated troponin 07/10/2017   Hypertension    Lobar pneumonia (HCC) 07/10/2017   Pleural effusion on right 07/10/2017   Sepsis (HCC) 07/10/2017   Tobacco abuse    UTI (urinary tract infection) 07/10/2017   Vitamin D deficiency 10/30/2011    Past Surgical History:  Procedure Laterality Date   ABDOMINAL HYSTERECTOMY     CATARACT EXTRACTION     FRACTURE SURGERY     IR THORACENTESIS ASP PLEURAL SPACE W/IMG GUIDE  07/11/2017   VIDEO ASSISTED THORACOSCOPY (VATS)/EMPYEMA Right 07/12/2017   Procedure: right VIDEO ASSISTED THORACOSCOPY (VATS) for drainage of EMPYEMA and decortication;  Surgeon: Loreli Slot, MD;  Location: [facility] OR;  Service: Thoracic;  Laterality: Right;   VIDEO BRONCHOSCOPY N/A 07/12/2017   Procedure: VIDEO BRONCHOSCOPY;  Surgeon: Loreli Slot, MD;  Location: [facility] OR;  Service: Thoracic;  Laterality: N/A;    Family History  Problem Relation Age of Onset   Hypertension Mother    Heart disease Mother    Hypertension Father    Heart disease Father    Stroke Brother    Hypertension Brother    Heart disease Brother     Social History:  reports that she has been smoking cigarettes. She has a 79.50 pack-year smoking history. She has never used smokeless tobacco. She reports current alcohol use of about 21.0 standard drinks of alcohol per week. She reports  that she does not use drugs.  Allergies:  Allergies  Allergen Reactions   Dextromethorphan Other (See Comments)    Makes her very angry and she wants to hurt people    Medications: I have reviewed the patient's current medications.  Results for orders placed or performed during the hospital encounter of 10/23/22 (from the past 48 hour(s))  Basic metabolic panel     Status: Abnormal   Collection Time: 10/23/22  8:27 PM  Result Value Ref Range   Sodium 127 (L) 135 - 145 mmol/L   Potassium 4.1 3.5 - 5.1 mmol/L    Comment: HEMOLYSIS AT THIS LEVEL MAY AFFECT RESULT   Chloride 86 (L) 98 - 111 mmol/L   CO2 29 22 - 32 mmol/L   Glucose, Bld 135 (H) 70 - 99 mg/dL    Comment: Glucose reference range applies only to samples taken after fasting for at least 8 hours.   BUN 9 8 - 23 mg/dL   Creatinine, Ser 4.09 0.44 - 1.00 mg/dL   Calcium 9.0 8.9 - 81.1 mg/dL   GFR, Estimated >91 >47 mL/min    Comment: (NOTE) Calculated using the CKD-EPI Creatinine Equation (2021)    Anion gap 12 5 - 15    Comment: Performed at [facility], [address], [place], [place] [address]  CBC     Status: Abnormal   Collection Time: 10/23/22  8:27  PM  Result Value Ref Range   WBC 11.9 (H) 4.0 - 10.5 K/uL   RBC 4.27 3.87 - 5.11 MIL/uL   Hemoglobin 14.8 12.0 - 15.0 g/dL   HCT 40.9 81.1 - 91.4 %   MCV 97.7 80.0 - 100.0 fL   MCH 34.7 (H) 26.0 - 34.0 pg   MCHC 35.5 30.0 - 36.0 g/dL   RDW 78.2 95.6 - 21.3 %   Platelets 312 150 - 400 K/uL   nRBC 0.0 0.0 - 0.2 %    Comment: Performed at [facility], [address], [place], [place] [address]  CK     Status: None   Collection Time: 10/23/22  8:27 PM  Result Value Ref Range   Total CK 61 38 - 234 U/L    Comment: HEMOLYSIS AT THIS LEVEL MAY AFFECT RESULT Performed at [facility], [address] [place], [place] [address]   Magnesium     Status: Abnormal   Collection Time: 10/24/22 12:51 AM  Result Value Ref Range   Magnesium 1.4 (L) 1.7 -  2.4 mg/dL    Comment: Performed at [facility], [address] [place], [place] [address]  Phosphorus     Status: None   Collection Time: 10/24/22 12:51 AM  Result Value Ref Range   Phosphorus 2.9 2.5 - 4.6 mg/dL    Comment: Performed at [facility], [address] [place], [place] [address]  CBC with Differential/Platelet     Status: Abnormal   Collection Time: 10/24/22 12:51 AM  Result Value Ref Range   WBC 9.7 4.0 - 10.5 K/uL   RBC 3.94 3.87 - 5.11 MIL/uL   Hemoglobin 13.6 12.0 - 15.0 g/dL   HCT 40.1 02.7 - 25.3 %   MCV 98.2 80.0 - 100.0 fL   MCH 34.5 (H) 26.0 - 34.0 pg   MCHC 35.1 30.0 - 36.0 g/dL   RDW 66.4 40.3 - 47.4 %   Platelets 275 150 - 400 K/uL   nRBC 0.0 0.0 - 0.2 %   Neutrophils Relative % 79 %   Neutro Abs 7.7 1.7 - 7.7 K/uL   Lymphocytes Relative 10 %   Lymphs Abs 1.0 0.7 - 4.0 K/uL   Monocytes Relative 10 %   Monocytes Absolute 0.9 0.1 - 1.0 K/uL   Eosinophils Relative 0 %   Eosinophils Absolute 0.0 0.0 - 0.5 K/uL   Basophils Relative 1 %   Basophils Absolute 0.1 0.0 - 0.1 K/uL   Immature Granulocytes 0 %   Abs Immature Granulocytes 0.02 0.00 - 0.07 K/uL    Comment: Performed at [facility], [address], [place], [place] [address]  Hemoglobin A1c     Status: Abnormal   Collection Time: 10/24/22 12:51 AM  Result Value Ref Range   Hgb A1c MFr Bld 4.7 (L) 4.8 - 5.6 %    Comment: (NOTE) Pre diabetes:          5.7%-6.4%  Diabetes:              >6.4%  Glycemic control for   <7.0% adults with diabetes    Mean Plasma Glucose 88.19 mg/dL    Comment: Performed at [facility], [address] [place], [place] [address]  Comprehensive metabolic panel     Status: Abnormal   Collection Time: 10/24/22 12:52 AM  Result Value Ref Range   Sodium 125 (L) 135 - 145 mmol/L   Potassium 3.6  3.5 - 5.1 mmol/L   Chloride 86 (L) 98 - 111 mmol/L   CO2 30 22 - 32 mmol/L   Glucose, Bld 119 (H) 70 - 99 mg/dL    Comment: Glucose reference range  applies only to samples taken after fasting for at least 8 hours.   BUN 8 8 - 23 mg/dL   Creatinine, Ser 4.09 0.44 - 1.00 mg/dL   Calcium 8.6 (L) 8.9 - 10.3 mg/dL   Total Protein 6.1 (L) 6.5 - 8.1 g/dL   Albumin 3.4 (L) 3.5 - 5.0 g/dL   AST 14 (L) 15 - 41 U/L   ALT 10 0 - 44 U/L   Alkaline Phosphatase 66 38 - 126 U/L   Total Bilirubin 1.2 0.3 - 1.2 mg/dL   GFR, Estimated >81 >19 mL/min    Comment: (NOTE) Calculated using the CKD-EPI Creatinine Equation (2021)    Anion gap 9 5 - 15    Comment: Performed at [facility], [address] [place], [place] [address]  Glucose, capillary     Status: Abnormal   Collection Time: 10/24/22  1:55 AM  Result Value Ref Range   Glucose-Capillary 122 (H) 70 - 99 mg/dL    Comment: Glucose reference range applies only to samples taken after fasting for at least 8 hours.  Glucose, capillary     Status: Abnormal   Collection Time: 10/24/22  5:19 AM  Result Value Ref Range   Glucose-Capillary 103 (H) 70 - 99 mg/dL    Comment: Glucose reference range applies only to samples taken after fasting for at least 8 hours.    CT Foot Right Wo Contrast  Result Date: 10/23/2022 CLINICAL DATA:  Fall and right foot pain. EXAM: CT OF THE RIGHT FOOT WITHOUT CONTRAST TECHNIQUE: Multidetector CT imaging of the right foot was performed according to the standard protocol. Multiplanar CT image reconstructions were also generated. RADIATION DOSE REDUCTION: This exam was performed according to the departmental dose-optimization program which includes automated exposure control, adjustment of the mA and/or kV according to patient size and/or use of iterative reconstruction technique. COMPARISON:  Right foot radiograph dated 10/23/2022. FINDINGS: Bones/Joint/Cartilage Evaluation of the bones is very limited due to advanced osteoporosis. No acute fracture or dislocation. MRI or bone scan may provide better evaluation if there is high clinical concern for acute fracture. Prior  internal fixation of the lateral and medial malleoli. Ligaments Suboptimally assessed by CT. Muscles and Tendons No acute findings. Soft tissues There is mild subcutaneous edema of the dorsum of the foot. IMPRESSION: 1. No acute fracture or dislocation. 2. Advanced osteoporosis. Electronically Signed   By: Elgie Collard M.D.   On: 10/23/2022 22:23   CT Knee Left Wo Contrast  Result Date: 10/23/2022 CLINICAL DATA:  Tibial plateau fracture EXAM: CT OF THE LEFT KNEE WITHOUT CONTRAST TECHNIQUE: Multidetector CT imaging of the left knee was performed according to the standard protocol. Multiplanar CT image reconstructions were also generated. RADIATION DOSE REDUCTION: This exam was performed according to the departmental dose-optimization program which includes automated exposure control, adjustment of the mA and/or kV according to patient size and/or use of iterative reconstruction technique. COMPARISON:  None Available. FINDINGS: Bones/Joint/Cartilage The bones are diffusely osteopenic. There is an acute minimally depressed (3 mm) medial tibial plateau fracture. Acute fracture involves the posterior aspect of the tibial spines. This is nondisplaced. There is minimal medial and lateral compartment joint space narrowing with chondrocalcinosis. Large knee joint effusion is present with fat fluid levels. Ligaments  Suboptimally assessed by CT. Muscles and Tendons Grossly intact, but well evaluated on noncontrast CT. Soft tissues There is mild subcutaneous edema surrounding the knee. Peripheral vascular calcifications are present. IMPRESSION: 1. Acute minimally depressed medial tibial plateau fracture. 2. Acute nondisplaced fracture of the posterior aspect of the tibial spines. 3. Large lipohemarthrosis. Electronically Signed   By: Darliss Cheney M.D.   On: 10/23/2022 22:22   DG Foot Complete Left  Result Date: 10/23/2022 CLINICAL DATA:  Fall with foot pain EXAM: LEFT FOOT - COMPLETE 3+ VIEW COMPARISON:  None  Available. FINDINGS: Osteopenia limits fracture assessment. Hallux valgus deformity at the first MTP joint with slight lateral subluxation base of first proximal phalanx. Mild degenerative change at the first MTP joint. Dorsal soft tissue swelling. IMPRESSION: Osteopenia limits fracture assessment. No definite acute osseous abnormality. Dorsal soft tissue swelling. Electronically Signed   By: Jasmine Pang M.D.   On: 10/23/2022 19:05   DG Foot Complete Right  Result Date: 10/23/2022 CLINICAL DATA:  Fall with pain EXAM: RIGHT FOOT COMPLETE - 3+ VIEW COMPARISON:  None Available. FINDINGS: Bones appear osteopenic which limits fracture assessment. Surgical plate and fixating screws in the distal fibula with fixating screws at the distal tibia. No malalignment. Acute appearing fractures involving the necks of the right third and fourth metatarsals. IMPRESSION: Acute appearing fractures involving the necks of the right third and fourth metatarsals. Electronically Signed   By: Jasmine Pang M.D.   On: 10/23/2022 19:02   DG Knee Complete 4 Views Left  Result Date: 10/23/2022 CLINICAL DATA:  Fall with knee pain EXAM: LEFT KNEE - COMPLETE 4+ VIEW COMPARISON:  None Available. FINDINGS: Bones appear osteopenic. No malalignment. Moderate knee effusion. Acute nondisplaced intra-articular fracture involving the proximal tibia with lucency to the medial tibial plateau. IMPRESSION: 1. Acute nondisplaced intra-articular fracture involving the proximal tibia with lucency extending to the medial tibial plateau. 2. Moderate knee effusion. Electronically Signed   By: Jasmine Pang M.D.   On: 10/23/2022 19:01    Review of Systems  HENT:  Negative for ear discharge, ear pain, hearing loss and tinnitus.   Eyes:  Negative for photophobia and pain.  Respiratory:  Negative for cough and shortness of breath.   Cardiovascular:  Negative for chest pain.  Gastrointestinal:  Negative for abdominal pain, nausea and vomiting.   Genitourinary:  Negative for dysuria, flank pain, frequency and urgency.  Musculoskeletal:  Positive for arthralgias (Left knee). Negative for back pain, myalgias and neck pain.  Neurological:  Negative for dizziness and headaches.  Hematological:  Does not bruise/bleed easily.  Psychiatric/Behavioral:  The patient is not nervous/anxious.    Blood pressure (!) 161/76, pulse 71, temperature 98.3 F (36.8 C), temperature source Oral, resp. rate 17, height 5\' 8"  (1.727 m), weight 63.5 kg, SpO2 93 %. Physical Exam Constitutional:      General: She is not in acute distress.    Appearance: She is well-developed. She is not diaphoretic.  HENT:     Head: Normocephalic and atraumatic.  Eyes:     General: No scleral icterus.       Right eye: No discharge.        Left eye: No discharge.     Conjunctiva/sclera: Conjunctivae normal.  Cardiovascular:     Rate and Rhythm: Normal rate and regular rhythm.  Pulmonary:     Effort: Pulmonary effort is normal. No respiratory distress.  Musculoskeletal:     Cervical back: Normal range of motion.     Comments:  LLE No traumatic wounds, ecchymosis, or rash  KI in place, mod TTP  Mod knee effusion  Sens DPN, SPN, TN intact  Motor EHL, ext, flex, evers 5/5  DP 2+, PT 2+, No significant edema  Skin:    General: Skin is warm and dry.  Neurological:     Mental Status: She is alert.  Psychiatric:        Mood and Affect: Mood normal.        Behavior: Behavior normal.     Assessment/Plan: Left tibia plateau fx -- Plan initial non-operative management with KI and NWB. F/u with Dr. Odis Hollingshead in 2 weeks. She may be WBAT on right foot. As CT did not show any acute fxs she may wear the post-op shoe for comfort but does not need it. Multiple medical problems including alcohol abuse, tobacco abuse, HTN, COPD, and mobility issues -- per primary service    Freeman Caldron, PA-C Orthopedic Surgery [phone] 10/24/2022, 9:04 AM

## 2022-10-24 NOTE — Evaluation (Signed)
Physical Therapy Evaluation Patient Details Name: Erica Ball MRN: [id number] DOB: 15-Feb-1951 Today's Date: 10/24/2022  History of Present Illness  72 year old female admitted 6/18 after fall, founmd to have left tibial plateau fracture and right metatarsal fracture; CT r/o metatarsal fractures on Rt 6/19. PMHx: alcohol abuse, tobacco abuse, HTN, COPD, mobility issues  Clinical Impression  Pt admitted with above diagnosis. Previously ambulates in apartment with a rollator, frequent falls (more recently.) Brother comes by to check on her but otherwise no support at home. Currently required up to Mod assist +2 for transfers out of bed. Reviewed precautions. KI adjusted for better fit and knee extension. Unsuccessful attempt to void on BSC. Patient will benefit from continued inpatient follow up therapy, <3 hours/day  Pt currently with functional limitations due to the deficits listed below (see PT Problem List). Pt will benefit from acute skilled PT to increase their independence and safety with mobility to allow discharge.          Recommendations for follow up therapy are one component of a multi-disciplinary discharge planning process, led by the attending physician.  Recommendations may be updated based on patient status, additional functional criteria and insurance authorization.  Follow Up Recommendations Can patient physically be transported by private vehicle: No     Assistance Recommended at Discharge Frequent or constant Supervision/Assistance  Patient can return home with the following  Two people to help with walking and/or transfers;Two people to help with bathing/dressing/bathroom;Assist for transportation;Help with stairs or ramp for entrance    Equipment Recommendations  (TBD next venue)  Recommendations for Other Services       Functional Status Assessment Patient has had a recent decline in their functional status and demonstrates the ability to make significant  improvements in function in a reasonable and predictable amount of time.     Precautions / Restrictions Precautions Precautions: Fall Required Braces or Orthoses: Knee Immobilizer - Left;Other Brace Other Brace: Post op shoe Left Restrictions Weight Bearing Restrictions: Yes RLE Weight Bearing: Weight bearing as tolerated LLE Weight Bearing: Non weight bearing      Mobility  Bed Mobility Overal bed mobility: Needs Assistance Bed Mobility: Supine to Sit     Supine to sit: Min assist, HOB elevated     General bed mobility comments: Min assist for LLE support to get to EOB, requires extra time, cues for technique and to scoot to EOB.    Transfers Overall transfer level: Needs assistance Equipment used: Rolling walker (2 wheels) Transfers: Sit to/from Stand, Bed to chair/wheelchair/BSC Sit to Stand: Mod assist, +2 physical assistance Stand pivot transfers: Mod assist, +2 physical assistance         General transfer comment: Mod assist +2 for boost and balance while providing intermittent assist for LLE to maintain NWB on Lt. Cues for sequencing. Performed from bed to [facility], and from [facility] to recliner. Pt able to pivot on Rt foot with post op shoe, declines Rt foot pain. Knee immobilizer in place on Lt. Tactile cues to facilitate weight shift onto UEs to unload RLE and maintain NWB on LLE.    Ambulation/Gait               General Gait Details: unable at this time.  Stairs            Wheelchair Mobility    Modified Rankin (Stroke Patients Only)       Balance Overall balance assessment: Needs assistance, History of Falls Sitting-balance support: No upper extremity supported, Feet supported  Sitting balance-Leahy Scale: Fair     Standing balance support: During functional activity, Bilateral upper extremity supported, Reliant on assistive device for balance Standing balance-Leahy Scale: Poor                               Pertinent Vitals/Pain  Pain Assessment Pain Assessment: 0-10 Pain Score: 3  Pain Location: Lt knee Pain Descriptors / Indicators: Aching Pain Intervention(s): Monitored during session, Repositioned    Home Living Family/patient expects to be discharged to:: Skilled nursing facility Living Arrangements: Alone Available Help at Discharge: Family;Available PRN/intermittently Type of Home: Apartment Home Access: Level entry       Home Layout: One level Home Equipment: Grab bars - tub/shower;BSC/3in1;Rolling Walker (2 wheels);Rollator (4 wheels);Cane - single point Additional Comments: Brother helps as needed    Prior Function Prior Level of Function : Needs assist;History of Falls (last six months)             Mobility Comments: Using rollator in apartment. Multiple falls ADLs Comments: Brother would assist getting up after fall, states she could perform bath/dress herself. Cooks own meals     Hand Dominance   Dominant Hand: Right    Extremity/Trunk Assessment   Upper Extremity Assessment Upper Extremity Assessment: Defer to OT evaluation    Lower Extremity Assessment Lower Extremity Assessment: Generalized weakness;LLE deficits/detail;RLE deficits/detail RLE Deficits / Details: Contusion Rt foot per pt. LLE Deficits / Details: In knee immoblizer. Some edema in ankle       Communication   Communication: No difficulties  Cognition Arousal/Alertness: Awake/alert Behavior During Therapy: WFL for tasks assessed/performed Overall Cognitive Status: Within Functional Limits for tasks assessed                                          General Comments General comments (skin integrity, edema, etc.): Lt knee immobilizer adjusted for better protection and extension.    Exercises General Exercises - Lower Extremity Ankle Circles/Pumps: AROM, Both, 10 reps, Seated Quad Sets: Strengthening, Both, 5 reps, Seated Gluteal Sets: Strengthening, Both, 5 reps, Seated    Assessment/Plan    PT Assessment Patient needs continued PT services  PT Problem List Decreased strength;Decreased range of motion;Decreased activity tolerance;Decreased mobility;Decreased balance;Decreased knowledge of use of DME;Pain;Decreased knowledge of precautions;Decreased safety awareness       PT Treatment Interventions DME instruction;Gait training;Functional mobility training;Therapeutic activities;Balance training;Therapeutic exercise;Neuromuscular re-education;Cognitive remediation;Patient/family education;Wheelchair mobility training;Modalities    PT Goals (Current goals can be found in the Care Plan section)  Acute Rehab PT Goals Patient Stated Goal: Get better PT Goal Formulation: With patient Time For Goal Achievement: 11/07/22 Potential to Achieve Goals: Fair    Frequency Min 3X/week     Co-evaluation PT/OT/SLP Co-Evaluation/Treatment: Yes Reason for Co-Treatment: Complexity of the patient's impairments (multi-system involvement);For patient/therapist safety;To address functional/ADL transfers PT goals addressed during session: Mobility/safety with mobility;Balance;Proper use of DME;Strengthening/ROM         AM-PAC PT "6 Clicks" Mobility  Outcome Measure Help needed turning from your back to your side while in a flat bed without using bedrails?: A Little Help needed moving from lying on your back to sitting on the side of a flat bed without using bedrails?: A Little Help needed moving to and from a bed to a chair (including a wheelchair)?: A Lot Help needed standing up from a  chair using your arms (e.g., wheelchair or bedside chair)?: A Lot Help needed to walk in hospital room?: Total Help needed climbing 3-5 steps with a railing? : Total 6 Click Score: 12    End of Session Equipment Utilized During Treatment: Gait belt;Left knee immobilizer Activity Tolerance: Patient tolerated treatment well Patient left: in chair;with call bell/phone within reach;with  chair alarm set Nurse Communication: Mobility status;Precautions;Weight bearing status (+2 transfer; KI) PT Visit Diagnosis: Unsteadiness on feet (R26.81);Repeated falls (R29.6);Muscle weakness (generalized) (M62.81);History of falling (Z91.81);Difficulty in walking, not elsewhere classified (R26.2);Pain Pain - Right/Left: Left Pain - part of body: Knee    Time: 1454-1536 PT Time Calculation (min) (ACUTE ONLY): 42 min   Charges:   PT Evaluation $PT Eval Moderate Complexity: 1 Mod          Kathlyn Sacramento, PT, DPT [facility]  Rehabilitation Services Physical Therapist Office: [phone] Website: [email or web address]   Berton Mount 10/24/2022, 4:28 PM

## 2022-10-24 NOTE — Evaluation (Signed)
Occupational Therapy Evaluation Patient Details Name: Erica Ball MRN: [id number] DOB: 04-18-1951 Today's Date: 10/24/2022   History of Present Illness 72 year old female admitted 6/18 after fall, founmd to have left tibial plateau fracture and right metatarsal fracture; CT r/o metatarsal fractures on Rt 6/19. PMHx: alcohol abuse, tobacco abuse, HTN, COPD, mobility issues   Clinical Impression   Pt admitted for above dx, PTA patient lived alone and reports being Mod I in mobility and ADLs. Pt currently presenting with impaired balance and LLE pain during mobility. Pt needing Mod A +2 assist for OOB mobility and needs reinforcement to maintain NWB precautions, can barely take steps but would be safe for pivoting. Pt with a hx of falls as well.Pt would benefit from continued acute skilled OT services to address above deficits and help transition to next level of care. Patient would benefit from post acute skilled rehab facility with <3 hours of therapy and 24/7 support       Recommendations for follow up therapy are one component of a multi-disciplinary discharge planning process, led by the attending physician.  Recommendations may be updated based on patient status, additional functional criteria and insurance authorization.   Assistance Recommended at Discharge Frequent or constant Supervision/Assistance  Patient can return home with the following Two people to help with walking and/or transfers;A lot of help with bathing/dressing/bathroom;Assistance with cooking/housework;Assist for transportation;Help with stairs or ramp for entrance;Direct supervision/assist for financial management;Direct supervision/assist for medications management    Functional Status Assessment  Patient has had a recent decline in their functional status and demonstrates the ability to make significant improvements in function in a reasonable and predictable amount of time.  Equipment Recommendations  None  recommended by OT (defer to next level of care)    Recommendations for Other Services       Precautions / Restrictions Precautions Precautions: Fall Required Braces or Orthoses: Knee Immobilizer - Left;Other Brace Other Brace: Post op shoe Left Restrictions Weight Bearing Restrictions: Yes RLE Weight Bearing: Weight bearing as tolerated LLE Weight Bearing: Non weight bearing      Mobility Bed Mobility Overal bed mobility: Needs Assistance Bed Mobility: Supine to Sit     Supine to sit: Min assist, HOB elevated     General bed mobility comments: Min assist for LLE support to get to EOB, requires extra time, cues for technique and to scoot to EOB.    Transfers Overall transfer level: Needs assistance Equipment used: Rolling walker (2 wheels) Transfers: Sit to/from Stand, Bed to chair/wheelchair/BSC Sit to Stand: Mod assist, +2 physical assistance Stand pivot transfers: Mod assist, +2 physical assistance         General transfer comment: Pt needing verbal and tactile input to maintain LLE NWB status. cues to sequence and for hand placement with RW use.      Balance Overall balance assessment: Needs assistance, History of Falls Sitting-balance support: No upper extremity supported, Feet supported Sitting balance-Leahy Scale: Fair     Standing balance support: During functional activity, Bilateral upper extremity supported, Reliant on assistive device for balance Standing balance-Leahy Scale: Poor                             ADL either performed or assessed with clinical judgement   ADL Overall ADL's : Needs assistance/impaired Eating/Feeding: Independent;Sitting   Grooming: Sitting;Set up;Supervision/safety   Upper Body Bathing: Sitting;Modified independent   Lower Body Bathing: Sitting/lateral leans;Moderate assistance   Upper Body Dressing :  Sitting;Modified independent   Lower Body Dressing: Sitting/lateral leans;Maximal assistance   Toilet  Transfer: Moderate assistance;+2 for physical assistance   Toileting- Clothing Manipulation and Hygiene: Sit to/from stand;Moderate assistance       Functional mobility during ADLs: +2 for physical assistance;Moderate assistance;Rolling walker (2 wheels)       Vision         Perception     Praxis      Pertinent Vitals/Pain Pain Assessment Pain Assessment: 0-10 Pain Score: 3  Pain Location: Lt knee Pain Descriptors / Indicators: Aching Pain Intervention(s): Monitored during session, Repositioned     Hand Dominance Right   Extremity/Trunk Assessment Upper Extremity Assessment Upper Extremity Assessment: Generalized weakness   Lower Extremity Assessment Lower Extremity Assessment: Generalized weakness RLE Deficits / Details: Contusion Rt foot per pt. LLE Deficits / Details: In knee immoblizer. Some edema in ankle       Communication Communication Communication: No difficulties   Cognition Arousal/Alertness: Awake/alert Behavior During Therapy: WFL for tasks assessed/performed Overall Cognitive Status: Within Functional Limits for tasks assessed                                       General Comments  Lt knee immobilizer adjusted for better protection and extension.    Exercises     Shoulder Instructions      Home Living Family/patient expects to be discharged to:: Skilled nursing facility Living Arrangements: Alone Available Help at Discharge: Family;Available PRN/intermittently Type of Home: Apartment Home Access: Level entry     Home Layout: One level     Bathroom Shower/Tub: Chief Strategy Officer: Standard Bathroom Accessibility: Yes How Accessible: Accessible via walker Home Equipment: Grab bars - tub/shower;BSC/3in1;Rolling Walker (2 wheels);Rollator (4 wheels);Cane - single point   Additional Comments: Brother helps as needed. 3 falls in the last couple of weeks      Prior Functioning/Environment Prior Level  of Function : Needs assist;History of Falls (last six months)             Mobility Comments: Using rollator in apartment. Multiple falls ADLs Comments: Brother would assist getting up after fall, states she could perform bath/dress herself. Cooks own meals        OT Problem List: Decreased strength;Decreased activity tolerance;Impaired balance (sitting and/or standing);Pain      OT Treatment/Interventions: Self-care/ADL training;Balance training;DME and/or AE instruction;Therapeutic activities;Patient/family education;Therapeutic exercise    OT Goals(Current goals can be found in the care plan section) Acute Rehab OT Goals Patient Stated Goal: TO get better OT Goal Formulation: With patient Time For Goal Achievement: 11/07/22 Potential to Achieve Goals: Good  OT Frequency: Min 2X/week    Co-evaluation   Reason for Co-Treatment: Complexity of the patient's impairments (multi-system involvement);For patient/therapist safety;To address functional/ADL transfers PT goals addressed during session: Mobility/safety with mobility;Balance;Proper use of DME;Strengthening/ROM        AM-PAC OT "6 Clicks" Daily Activity     Outcome Measure Help from another person eating meals?: None Help from another person taking care of personal grooming?: A Little Help from another person toileting, which includes using toliet, bedpan, or urinal?: A Lot Help from another person bathing (including washing, rinsing, drying)?: A Lot Help from another person to put on and taking off regular upper body clothing?: None Help from another person to put on and taking off regular lower body clothing?: A Lot 6 Click Score: 17  End of Session Equipment Utilized During Treatment: Rolling walker (2 wheels) Nurse Communication: Mobility status  Activity Tolerance: Patient tolerated treatment well Patient left: in bed;with call bell/phone within reach  OT Visit Diagnosis: Unsteadiness on feet (R26.81);Other  abnormalities of gait and mobility (R26.89);History of falling (Z91.81);Pain Pain - Right/Left: Left Pain - part of body: Knee                Time: 1308-6578 OT Time Calculation (min): 38 min Charges:  OT General Charges $OT Visit: 1 Visit OT Evaluation $OT Eval Moderate Complexity: 1 Mod OT Treatments $Therapeutic Activity: 8-22 mins  10/24/2022  AB, OTR/L  Acute Rehabilitation Services  Office: [phone]   Tristan Schroeder 10/24/2022, 4:36 PM

## 2022-10-24 NOTE — Assessment & Plan Note (Signed)
-  PT/OT evaluation -Fall precautions

## 2022-10-24 NOTE — Assessment & Plan Note (Signed)
Blood pressure was elevated.  She was not getting any medications at home as she has not seen a physician in years.  Started on hydralazine and metoprolol -Continue hydralazine and metoprolol

## 2022-10-25 DIAGNOSIS — I10 Essential (primary) hypertension: Secondary | ICD-10-CM | POA: Diagnosis not present

## 2022-10-25 DIAGNOSIS — E871 Hypo-osmolality and hyponatremia: Secondary | ICD-10-CM | POA: Diagnosis not present

## 2022-10-25 DIAGNOSIS — S82142A Displaced bicondylar fracture of left tibia, initial encounter for closed fracture: Secondary | ICD-10-CM | POA: Diagnosis not present

## 2022-10-25 DIAGNOSIS — F1010 Alcohol abuse, uncomplicated: Secondary | ICD-10-CM | POA: Diagnosis not present

## 2022-10-25 LAB — OSMOLALITY, URINE: Osmolality, Ur: 174 mOsm/kg — ABNORMAL LOW (ref 300–900)

## 2022-10-25 LAB — BASIC METABOLIC PANEL
Anion gap: 8 (ref 5–15)
BUN: 11 mg/dL (ref 8–23)
CO2: 30 mmol/L (ref 22–32)
Calcium: 8.5 mg/dL — ABNORMAL LOW (ref 8.9–10.3)
Chloride: 87 mmol/L — ABNORMAL LOW (ref 98–111)
Creatinine, Ser: 0.57 mg/dL (ref 0.44–1.00)
GFR, Estimated: >60 mL/min (ref >60)
Glucose, Bld: 105 mg/dL — ABNORMAL HIGH (ref 70–99)
Potassium: 3.9 mmol/L (ref 3.5–5.1)
Sodium: 125 mmol/L — ABNORMAL LOW (ref 135–145)

## 2022-10-25 LAB — CBC
HCT: 37.7 % (ref 36.0–46.0)
Hemoglobin: 13 g/dL (ref 12.0–15.0)
MCH: 33.5 pg (ref 26.0–34.0)
MCHC: 34.5 g/dL (ref 30.0–36.0)
MCV: 97.2 fL (ref 80.0–100.0)
Platelets: 268 10*3/uL (ref 150–400)
RBC: 3.88 MIL/uL (ref 3.87–5.11)
RDW: 12.1 % (ref 11.5–15.5)
WBC: 8.4 10*3/uL (ref 4.0–10.5)
nRBC: 0 % (ref 0.0–0.2)

## 2022-10-25 LAB — GLUCOSE, CAPILLARY
Glucose-Capillary: 103 mg/dL — ABNORMAL HIGH (ref 70–99)
Glucose-Capillary: 103 mg/dL — ABNORMAL HIGH (ref 70–99)
Glucose-Capillary: 104 mg/dL — ABNORMAL HIGH (ref 70–99)
Glucose-Capillary: 112 mg/dL — ABNORMAL HIGH (ref 70–99)
Glucose-Capillary: 90 mg/dL (ref 70–99)
Glucose-Capillary: 96 mg/dL (ref 70–99)

## 2022-10-25 LAB — SODIUM, URINE, RANDOM: Sodium, Ur: 15 mmol/L

## 2022-10-25 LAB — MAGNESIUM: Magnesium: 2.2 mg/dL (ref 1.7–2.4)

## 2022-10-25 MED ORDER — SODIUM CHLORIDE 0.9 % IV BOLUS
1000.0000 mL | Freq: Once | INTRAVENOUS | Status: AC
  Administered 2022-10-25: 1000 mL via INTRAVENOUS

## 2022-10-25 NOTE — Assessment & Plan Note (Signed)
Resolved with repletion Magnesium at 2.2. -Replete magnesium and monitor

## 2022-10-25 NOTE — Progress Notes (Signed)
CSW attempted to meet with pt.  Pt sleeping soundly, did not respond to verbal attempts to wake her.  Will return later. Daleen Squibb, MSW, LCSW 6/20/20241:02 PM

## 2022-10-25 NOTE — Progress Notes (Signed)
Mobility Specialist Progress Note:    10/25/22 1000  Mobility  Activity Transferred from bed to chair  Level of Assistance +2 (takes two people)  Assistive Device Other (Comment) (HHA)  LLE Weight Bearing NWB  Activity Response Tolerated well  Mobility Referral Yes  $Mobility charge 1 Mobility  Mobility Specialist Start Time (ACUTE ONLY) 1015  Mobility Specialist Stop Time (ACUTE ONLY) 1033  Mobility Specialist Time Calculation (min) (ACUTE ONLY) 18 min   Pt received in bed, very lethargic, needing physical and light stimulation to fully awaken. Pt required MaxA +2 and tactile/multimodal cuing to complete bed mobility. Upon dangling EOB pt was more alert and orient to situation. Pt required ModA +2 to stand and pivot to recliner chair. Did not adhere to NWB on LLE w/ verbal cues, needing assistance to maintain NWB precautions. Pt left in chair with call bell and personal belongings at reach. Chair alarm on.  Thompson Grayer Mobility Specialist  Please contact vis Secure Chat or  Rehab Office [phone]

## 2022-10-25 NOTE — Assessment & Plan Note (Addendum)
Seems chronic and stable today, sodium at 125.  Likely secondary to heavy alcohol use. Hyponatremia lab labs with hypotonic hyponatremia with low serum and urine osmolality.  Urinary sodium at 15.  TSH normal -Giving a trial of normal saline -Monitor sodium and if no improvement then she might need salt tablets

## 2022-10-25 NOTE — TOC CAGE-AID Note (Signed)
Transition of Care [facility]) - CAGE-AID Screening   Patient Details  Name: Erica Ball MRN: [id number] Date of Birth: [birth year]  Transition of Care [facility]) CM/SW Contact:    Lorri Frederick, LCSW Phone Number: 10/25/2022, 1:09 PM   Clinical Narrative: CSW met with pt to complete Cage aid.  Pt reports daily drinking of liquor drinks, reports she keeps a drink with her all day and evening and sips on it.  Denies drug use.  No prior treatment.  Pt denies desire to stop drinking and does not feel like ETOH use is causing problems or caused her fall.  Contact information for treatment providers provided.      CAGE-AID Screening:    Have You Ever Felt You Ought to Cut Down on Your Drinking or Drug Use?: No Have People Annoyed You By Critizing Your Drinking Or Drug Use?: Yes Have You Felt Bad Or Guilty About Your Drinking Or Drug Use?: No Have You Ever Had a Drink or Used Drugs First Thing In The Morning to Steady Your Nerves or to Get Rid of a Hangover?: No CAGE-AID Score: 1  Substance Abuse Education Offered: Yes  Substance abuse interventions: Other (must comment) (contact information for treatment providers)

## 2022-10-25 NOTE — NC FL2 (Signed)
[place] MEDICAID FL2 LEVEL OF CARE FORM     IDENTIFICATION  Patient Name: Erica Ball Birthdate: 08-22-1950 Sex: female Admission Date (Current Location): 10/23/2022  [facility] and [place] Number:  Producer, television/film/video and Address:  [facility] [place], [address], [place], [place] [address]      Provider Number: [id number]  Attending Physician Name and Address:  Arnetha Courser, MD  Relative Name and Phone Number:  Elon Spanner [phone]    Current Level of Care: Hospital Recommended Level of Care: Skilled Nursing Facility Prior Approval Number:    Date Approved/Denied:   PASRR Number: [id number]  Discharge Plan: SNF    Current Diagnoses: Patient Active Problem List   Diagnosis Date Noted   Tibial plateau fracture 10/24/2022   Hyponatremia 10/24/2022   Hypomagnesemia 10/24/2022   Tibial plateau fracture, left, closed, initial encounter 10/23/2022   Fall at home, initial encounter 10/23/2022   COPD GOLD 0 / still smoking  12/02/2017   Elevated troponin 07/10/2017   Pleural effusion on right 07/10/2017   UTI (urinary tract infection) 07/10/2017   Sepsis (HCC) 07/10/2017   Lobar pneumonia (HCC) 07/10/2017   Acute kidney injury (HCC) 07/10/2017   Acute respiratory failure with hypoxia (HCC) 07/10/2017   Alcohol abuse    Tobacco abuse 08/20/2014   Hypertension 10/30/2011   Diverticulitis 10/30/2011   Vitamin D deficiency 10/30/2011    Orientation RESPIRATION BLADDER Height & Weight     Self, Time, Situation, Place  Normal Incontinent Weight: 140 lb (63.5 kg) Height:  5\' 8"  (172.7 cm)  BEHAVIORAL SYMPTOMS/MOOD NEUROLOGICAL BOWEL NUTRITION STATUS      Continent Diet (see discharge summary)  AMBULATORY STATUS COMMUNICATION OF NEEDS Skin   Total Care Verbally Normal                       Personal Care Assistance Level of Assistance  Bathing, Feeding, Dressing Bathing Assistance: Limited assistance Feeding assistance:  Independent Dressing Assistance: Maximum assistance     Functional Limitations Info  Sight, Hearing, Speech Sight Info: Adequate Hearing Info: Adequate Speech Info: Adequate    SPECIAL CARE FACTORS FREQUENCY  PT (By licensed PT), OT (By licensed OT)     PT Frequency: 5x week OT Frequency: 5x week            Contractures Contractures Info: Not present    Additional Factors Info  Code Status, Allergies Code Status Info: full Allergies Info: Dextromethorphan           Current Medications (10/25/2022):  This is the current hospital active medication list Current Facility-Administered Medications  Medication Dose Route Frequency Provider Last Rate Last Admin   chlordiazePOXIDE (LIBRIUM) capsule 25 mg  25 mg Oral TID Gery Pray, MD   25 mg at 10/25/22 8469   Followed by   Melene Muller ON 10/26/2022] chlordiazePOXIDE (LIBRIUM) capsule 25 mg  25 mg Oral BH-qamhs Crosley, Debby, MD       Followed by   Melene Muller ON 10/27/2022] chlordiazePOXIDE (LIBRIUM) capsule 25 mg  25 mg Oral Daily Crosley, Debby, MD       enoxaparin (LOVENOX) injection 40 mg  40 mg Subcutaneous Q24H Crosley, Debby, MD   40 mg at 10/25/22 0011   feeding supplement (ENSURE ENLIVE / ENSURE PLUS) liquid 237 mL  237 mL Oral BID BM Arnetha Courser, MD   237 mL at 10/25/22 6295   folic acid (FOLVITE) tablet 1 mg  1 mg Oral Daily Vonita Moss  C, MD   1 mg at 10/25/22 2725   hydrALAZINE (APRESOLINE) tablet 25 mg  25 mg Oral Q8H Crosley, Debby, MD   25 mg at 10/25/22 0526   HYDROmorphone (DILAUDID) injection 0.5 mg  0.5 mg Intravenous Q3H PRN Gery Pray, MD       hydrOXYzine (ATARAX) tablet 25 mg  25 mg Oral Q6H PRN Crosley, Debby, MD       ipratropium-albuterol (DUONEB) 0.5-2.5 (3) MG/3ML nebulizer solution 3 mL  3 mL Nebulization Q4H PRN Crosley, Debby, MD       loperamide (IMODIUM) capsule 2-4 mg  2-4 mg Oral PRN Joneen Roach, Debby, MD       LORazepam (ATIVAN) tablet 1-4 mg  1-4 mg Oral Q1H PRN Rondel Baton, MD        metoprolol tartrate (LOPRESSOR) injection 5 mg  5 mg Intravenous Q4H PRN Crosley, Debby, MD       metoprolol tartrate (LOPRESSOR) tablet 25 mg  25 mg Oral BID Joneen Roach, Debby, MD   25 mg at 10/25/22 3664   multivitamin with minerals tablet 1 tablet  1 tablet Oral Daily Rondel Baton, MD   1 tablet at 10/25/22 4034   nicotine (NICODERM CQ - dosed in mg/24 hours) patch 14 mg  14 mg Transdermal Daily Crosley, Debby, MD       ondansetron (ZOFRAN) tablet 4 mg  4 mg Oral Q6H PRN Crosley, Debby, MD       Or   ondansetron (ZOFRAN) injection 4 mg  4 mg Intravenous Q6H PRN Crosley, Debby, MD       senna-docusate (Senokot-S) tablet 1 tablet  1 tablet Oral QHS PRN Crosley, Debby, MD       thiamine (VITAMIN B1) tablet 100 mg  100 mg Oral Daily Rondel Baton, MD   100 mg at 10/25/22 7425   Or   thiamine (VITAMIN B1) injection 100 mg  100 mg Intravenous Daily Rondel Baton, MD         Discharge Medications: Please see discharge summary for a list of discharge medications.  Relevant Imaging Results:  Relevant Lab Results:   Additional Information SSN: [id number]  Lorri Frederick, LCSW

## 2022-10-25 NOTE — Assessment & Plan Note (Signed)
Patient drinks heavily daily.  Denies any prior withdrawals or seizures. Counseling was provided-does not have much desire to quit -CIWA protocol

## 2022-10-25 NOTE — TOC Initial Note (Signed)
Transition of Care [facility]) - Initial/Assessment Note    Patient Details  Name: Erica Ball MRN: [id number] Date of Birth: 09-08-1950  Transition of Care [facility]) CM/SW Contact:    Lorri Frederick, LCSW Phone Number: 10/25/2022, 1:28 PM  Clinical Narrative:   CSW met with pt to complete Cage Aid (see separate note) and to discuss DC recommendation for SNF.  Pt agreeable to SNF, medicare choice document provided.  No SNF preference indicated, permission given to send out referral in hub.  Permission given to speak with brother Tammy Sours, sister Edd Arbour, son Asher Muir.  Pt lives alone, no current services.  Referral sent out in hub for SNF.               Expected Discharge Plan: Skilled Nursing Facility Barriers to Discharge: Continued Medical Work up, SNF Pending bed offer   Patient Goals and CMS Choice Patient states their goals for this hospitalization and ongoing recovery are:: get on my feet, get back home CMS Medicare.gov Compare Post Acute Care list provided to:: Patient Choice offered to / list presented to : Patient      Expected Discharge Plan and Services In-house Referral: Clinical Social Work   Post Acute Care Choice: Skilled Nursing Facility Living arrangements for the past 2 months: Single Family Home                                      Prior Living Arrangements/Services Living arrangements for the past 2 months: Single Family Home Lives with:: Self Patient language and need for interpreter reviewed:: Yes Do you feel safe going back to the place where you live?: Yes      Need for Family Participation in Patient Care: No (Comment) Care giver support system in place?: Yes (comment) Current home services: Other (comment) (none) Criminal Activity/Legal Involvement Pertinent to Current Situation/Hospitalization: No - Comment as needed  Activities of Daily Living Home Assistive Devices/Equipment: Walker (specify type), Eyeglasses, Dentures (specify type),  Bedside commode/3-in-1 (partial lower, full upper;) ADL Screening (condition at time of admission) Patient's cognitive ability adequate to safely complete daily activities?: Yes Is the patient deaf or have difficulty hearing?: No Does the patient have difficulty seeing, even when wearing glasses/contacts?: No Does the patient have difficulty concentrating, remembering, or making decisions?: No Patient able to express need for assistance with ADLs?: Yes Does the patient have difficulty dressing or bathing?: Yes Independently performs ADLs?: No Communication: Independent Dressing (OT): Needs assistance Is this a change from baseline?: Change from baseline, expected to last >3 days Grooming: Independent Feeding: Independent Bathing: Needs assistance Is this a change from baseline?: Change from baseline, expected to last >3 days Toileting: Needs assistance Is this a change from baseline?: Change from baseline, expected to last >3days In/Out Bed: Needs assistance Is this a change from baseline?: Change from baseline, expected to last >3 days Walks in Home: Needs assistance Is this a change from baseline?: Change from baseline, expected to last >3 days Does the patient have difficulty walking or climbing stairs?: Yes Weakness of Legs: Both Weakness of Arms/Hands: None  Permission Sought/Granted Permission sought to share information with : Family Supports Permission granted to share information with : Yes, Verbal Permission Granted  Share Information with NAME: brother Tammy Sours, sister [name], son Asher Muir  Permission granted to share info w AGENCY: SNF        Emotional Assessment Appearance:: Appears stated age Attitude/Demeanor/Rapport: Engaged  Affect (typically observed): Appropriate, Pleasant Orientation: : Oriented to Self, Oriented to Place, Oriented to  Time, Oriented to Situation Alcohol / Substance Use: Alcohol Use    Admission diagnosis:  History of alcohol abuse  [F10.11] Tibial plateau fracture [S82.143A] Fall, initial encounter [W19.XXXA] Closed fracture of left tibial plateau, initial encounter [S82.142A] Closed nondisplaced fracture of metatarsal bone of right foot, unspecified metatarsal, initial encounter [S92.301A] Patient Active Problem List   Diagnosis Date Noted   Tibial plateau fracture 10/24/2022   Hyponatremia 10/24/2022   Hypomagnesemia 10/24/2022   Tibial plateau fracture, left, closed, initial encounter 10/23/2022   Fall at home, initial encounter 10/23/2022   COPD GOLD 0 / still smoking  12/02/2017   Elevated troponin 07/10/2017   Pleural effusion on right 07/10/2017   UTI (urinary tract infection) 07/10/2017   Sepsis (HCC) 07/10/2017   Lobar pneumonia (HCC) 07/10/2017   Acute kidney injury (HCC) 07/10/2017   Acute respiratory failure with hypoxia (HCC) 07/10/2017   Alcohol abuse    Tobacco abuse 08/20/2014   Hypertension 10/30/2011   Diverticulitis 10/30/2011   Vitamin D deficiency 10/30/2011   PCP:  Norm Salt, PA Pharmacy:   [facility] [phone] [place] Otto, [place] - [address] AT [facility] [address] [place] [address] Phone: [phone] Fax: [phone]     Social Determinants of Health (SDOH) Social History: SDOH Screenings   Food Insecurity: No Food Insecurity (10/24/2022)  Housing: Low Risk  (10/24/2022)  Transportation Needs: No Transportation Needs (10/24/2022)  Utilities: Not At Risk (10/24/2022)  Tobacco Use: High Risk (10/23/2022)   SDOH Interventions:     Readmission Risk Interventions     No data to display

## 2022-10-25 NOTE — Progress Notes (Signed)
Progress Note   Patient: Erica Ball:[id number] DOB: 31-Jul-1950 DOA: 10/23/2022     1 DOS: the patient was seen and examined on 10/25/2022   Brief hospital course: Taken from H&P.  72 year old female with past medical history of alcohol abuse, tobacco abuse, HTN, COPD, mobility issues came to ED after having a fall while trying to sit in a sofa, she believes she missed the seat and fell on the carpet.  She laid there for hours and early morning called her brother who helped her get up.  Next morning when she woke up she had her foot and knee swollen and she was unable to bear weight.  In the ER imaging reveals left tibial plateau fracture and right metatarsal fractures. Orthopedics consulted.   6/19: Vital stable.  Labs pertinent for worsening hyponatremia with sodium at 125, seems chronic, CBC stable.  Hypomagnesemia with magnesium at 1.4 which is being repleted.  A1c of 4.7.  Patient was on D5, IV fluid was discontinued.  Hyponatremia labs ordered Orthopedic surgery is recommending conservative management with knee immobilizer and nonweightbearing on left lower extremity.  She will be weightbearing as tolerated on right foot.  Pending PT/OT evaluation  6/20: Blood pressure elevated.  Stable hyponatremia at 125, hyponatremia labs with hypotonic hyponatremia with decreased urine and serum osmolality, urine sodium at 15. Giving a trial of normal saline.  Might need salt tablet. PT is recommending SNF   Assessment and Plan: * Tibial plateau fracture, left, closed, initial encounter Secondary to mechanical fall.  Orthopedic surgery was consulted and they were recommending conservative management with left knee immobilizer and NWB on left, patient will be weightbearing as tolerated on right. -Continue with pain management -PT/OT recommending SNF -TOC consult  Hyponatremia Seems chronic and stable today, sodium at 125.  Likely secondary to heavy alcohol use. Hyponatremia lab labs  with hypotonic hyponatremia with low serum and urine osmolality.  Urinary sodium at 15.  TSH normal -Giving a trial of normal saline -Monitor sodium and if no improvement then she might need salt tablets  Alcohol abuse Patient drinks heavily daily.  Denies any prior withdrawals or seizures. Counseling was provided-does not have much desire to quit -CIWA protocol  Hypertension Blood pressure was elevated.  She was not getting any medications at home as she has not seen a physician in years.  Started on hydralazine and metoprolol -Continue hydralazine and metoprolol   COPD GOLD 0 / still smoking  No acute exacerbation.  -Continue with as needed bronchodilator  Fall at home, initial encounter -PT/OT evaluation -Fall precautions  Tobacco abuse Counseling was provided. -Patient apparently declined nicotine patch  Hypomagnesemia Resolved with repletion Magnesium at 2.2. -Replete magnesium and monitor      Subjective: Patient was sleeping comfortably and snoring when seen today.  Able to wake her up but she quickly went back to sleep stating that she is tired.  Physical Exam: Vitals:   10/24/22 2100 10/25/22 0519 10/25/22 0718 10/25/22 0812  BP: (!) 167/90 (!) 154/75 (!) 162/94 (!) 180/75  Pulse: 71 (!) 58 70 70  Resp: 20 20    Temp: 98 F (36.7 C) 98 F (36.7 C) 98.3 F (36.8 C)   TempSrc: Oral Axillary    SpO2: 96% 96% 98%   Weight:      Height:       General.  Frail and malnourished elderly lady, in no acute distress. Pulmonary.  Lungs clear bilaterally, normal respiratory effort. CV.  Regular rate and rhythm,  no JVD, rub or murmur. Abdomen.  Soft, nontender, nondistended, BS positive. CNS.  Somnolent, no apparent deficit. Extremities.  No edema, no cyanosis, pulses intact and symmetrical.  Data Reviewed: Prior data reviewed  Family Communication: Called brother and son with no response  Disposition: Status is: Inpatient Remains inpatient appropriate  because: Severity of illness  Planned Discharge Destination: Skilled nursing facility  DVT prophylaxis.  Lovenox Time spent: 40 minutes  This record has been created using Conservation officer, historic buildings. Errors have been sought and corrected,but may not always be located. Such creation errors do not reflect on the standard of care.   Author: Arnetha Courser, MD 10/25/2022 1:42 PM  For on call review [email or web address].

## 2022-10-25 NOTE — Assessment & Plan Note (Signed)
Secondary to mechanical fall.  Orthopedic surgery was consulted and they were recommending conservative management with left knee immobilizer and NWB on left, patient will be weightbearing as tolerated on right. -Continue with pain management -PT/OT recommending SNF -TOC consult

## 2022-10-26 DIAGNOSIS — S82142A Displaced bicondylar fracture of left tibia, initial encounter for closed fracture: Secondary | ICD-10-CM | POA: Diagnosis not present

## 2022-10-26 LAB — CBC
HCT: 39.4 % (ref 36.0–46.0)
Hemoglobin: 13.5 g/dL (ref 12.0–15.0)
MCH: 33.7 pg (ref 26.0–34.0)
MCHC: 34.3 g/dL (ref 30.0–36.0)
MCV: 98.3 fL (ref 80.0–100.0)
Platelets: 272 10*3/uL (ref 150–400)
RBC: 4.01 MIL/uL (ref 3.87–5.11)
RDW: 12.1 % (ref 11.5–15.5)
WBC: 9.4 10*3/uL (ref 4.0–10.5)
nRBC: 0 % (ref 0.0–0.2)

## 2022-10-26 LAB — BASIC METABOLIC PANEL
Anion gap: 12 (ref 5–15)
BUN: 8 mg/dL (ref 8–23)
CO2: 26 mmol/L (ref 22–32)
Calcium: 8.5 mg/dL — ABNORMAL LOW (ref 8.9–10.3)
Chloride: 89 mmol/L — ABNORMAL LOW (ref 98–111)
Creatinine, Ser: 0.54 mg/dL (ref 0.44–1.00)
GFR, Estimated: >60 mL/min (ref >60)
Glucose, Bld: 86 mg/dL (ref 70–99)
Potassium: 3.9 mmol/L (ref 3.5–5.1)
Sodium: 127 mmol/L — ABNORMAL LOW (ref 135–145)

## 2022-10-26 LAB — GLUCOSE, CAPILLARY
Glucose-Capillary: 92 mg/dL (ref 70–99)
Glucose-Capillary: 88 mg/dL (ref 70–99)
Glucose-Capillary: 104 mg/dL — ABNORMAL HIGH (ref 70–99)
Glucose-Capillary: 107 mg/dL — ABNORMAL HIGH (ref 70–99)

## 2022-10-26 LAB — AMMONIA: Ammonia: 18 umol/L (ref 9–35)

## 2022-10-26 MED ORDER — CHLORDIAZEPOXIDE HCL 25 MG PO CAPS: 25.0000 mg | ORAL_CAPSULE | Freq: Every day | ORAL | Status: DC

## 2022-10-26 MED ORDER — FOOD THICKENER (SIMPLYTHICK): 10.0000 | ORAL | Status: DC | PRN

## 2022-10-26 MED ORDER — HYDRALAZINE HCL 50 MG PO TABS
50.0000 mg | ORAL_TABLET | Freq: Three times a day (TID) | ORAL | Status: DC
  Administered 2022-10-26 – 2022-10-29 (×9): 50 mg via ORAL
  Filled 2022-10-26 (×9): qty 1

## 2022-10-26 MED ORDER — ACETAMINOPHEN 325 MG PO TABS
650.0000 mg | ORAL_TABLET | Freq: Four times a day (QID) | ORAL | Status: AC | PRN
  Administered 2022-10-26 – 2022-10-28 (×2): 650 mg via ORAL
  Filled 2022-10-26 (×2): qty 2

## 2022-10-26 MED ORDER — SODIUM CHLORIDE 0.9 % IV BOLUS
1000.0000 mL | Freq: Once | INTRAVENOUS | Status: AC
  Administered 2022-10-26: 1000 mL via INTRAVENOUS

## 2022-10-26 NOTE — Progress Notes (Signed)
PT Cancellation Note  Patient Details Name: SHALAINA GUARDIOLA MRN: [id number] DOB: [birth year]   Cancelled Treatment:    Reason Eval/Treat Not Completed: Other (comment)  Sitting up in chair, BP 217/100; RN in room now. Thinks she may have slept in chair overnight. Will check back in a little while and assist to bed to provide some pressure relief on buttocks.   Berton Mount 10/26/2022, 1:58 PM

## 2022-10-26 NOTE — TOC Progression Note (Addendum)
Transition of Care [facility]) - Progression Note    Patient Details  Name: Erica Ball MRN: [id number] Date of Birth: [birth year]  [facility] CM/SW Contact  Lorri Frederick, LCSW Phone Number: 10/26/2022, 12:50 PM  Clinical Narrative:   Bed offers provided to pt, still quite sleepy but agreed to look at them later.  CSW spoke with pt sister [name], also provided bed offers.  [name] interested in the [place] offer due to location and rating.  CSW returned to the room with pt, put [name] on speakerphone and they discussed.  Pt agreeable to accept offer at [facility].  Waiting on confirmation from [facility].    1545: [facility] confirms she can accept pt as early as Monday.     Expected Discharge Plan: Skilled Nursing Facility Barriers to Discharge: Continued Medical Work up, SNF Pending bed offer  Expected Discharge Plan and Services In-house Referral: Clinical Social Work   Post Acute Care Choice: Skilled Nursing Facility Living arrangements for the past 2 months: Single Family Home                                       Social Determinants of Health (SDOH) Interventions SDOH Screenings   Food Insecurity: No Food Insecurity (10/24/2022)  Housing: Low Risk  (10/24/2022)  Transportation Needs: No Transportation Needs (10/24/2022)  Utilities: Not At Risk (10/24/2022)  Tobacco Use: High Risk (10/23/2022)    Readmission Risk Interventions     No data to display

## 2022-10-26 NOTE — Care Management Important Message (Signed)
Important Message  Patient Details  Name: Erica Ball MRN: [id number] Date of Birth: 08-08-1950   Medicare Important Message Given:  Yes     Sherilyn Banker 10/26/2022, 12:34 PM

## 2022-10-26 NOTE — Progress Notes (Signed)
Physical Therapy Treatment Patient Details Name: Erica Ball MRN: [id number] DOB: 10-08-1950 Today's Date: 10/26/2022   History of Present Illness 72 year old female admitted 6/18 after fall, founmd to have left tibial plateau fracture and right metatarsal fracture; CT r/o metatarsal fractures on Rt 6/19. PMHx: alcohol abuse, tobacco abuse, HTN, COPD, mobility issues    PT Comments    Lethargic today, requires a lot of multimodal cues and stimulus to participate. Practiced stand pivot transfer and bed mobility. Up to mod assist with sit<>stand and pivot, to safely maintain NWB on LLE. KI in place, LE elevated in bed end of session. Complains of some neck pain end of session. RN notified. Patient will continue to benefit from skilled physical therapy services to further improve independence with functional mobility.    Recommendations for follow up therapy are one component of a multi-disciplinary discharge planning process, led by the attending physician.  Recommendations may be updated based on patient status, additional functional criteria and insurance authorization.  Follow Up Recommendations  Can patient physically be transported by private vehicle: No    Assistance Recommended at Discharge Frequent or constant Supervision/Assistance  Patient can return home with the following Two people to help with walking and/or transfers;Two people to help with bathing/dressing/bathroom;Assist for transportation;Help with stairs or ramp for entrance   Equipment Recommendations   (TBD next venue)    Recommendations for Other Services       Precautions / Restrictions Precautions Precautions: Fall Required Braces or Orthoses: Knee Immobilizer - Left;Other Brace Other Brace: Post op shoe Left Restrictions Weight Bearing Restrictions: Yes RLE Weight Bearing: Weight bearing as tolerated LLE Weight Bearing: Non weight bearing     Mobility  Bed Mobility Overal bed mobility: Needs  Assistance Bed Mobility: Sit to Supine       Sit to supine: Mod assist   General bed mobility comments: Mod assist for LE support into bed, cues for technique. Able to boost up in bed with RLE and arms on rail with max cues and mod assist.    Transfers Overall transfer level: Needs assistance Equipment used: Rolling walker (2 wheels) Transfers: Sit to/from Stand, Bed to chair/wheelchair/BSC Sit to Stand: Mod assist Stand pivot transfers: Mod assist         General transfer comment: Mod assist for boost to stand and maintain NWB on LLE with KI in place. Mod assist for balance and sequencing to pivot to bed, poor control, lower level of arousal and following commands.    Ambulation/Gait               General Gait Details: unable at this time.   Stairs             Wheelchair Mobility    Modified Rankin (Stroke Patients Only)       Balance Overall balance assessment: Needs assistance, History of Falls Sitting-balance support: No upper extremity supported, Feet supported Sitting balance-Leahy Scale: Fair     Standing balance support: During functional activity, Bilateral upper extremity supported, Reliant on assistive device for balance Standing balance-Leahy Scale: Poor                              Cognition Arousal/Alertness: Lethargic, Suspect due to medications Behavior During Therapy: Flat affect Overall Cognitive Status: Difficult to assess  General Comments: knows she is in [place].        Exercises      General Comments General comments (skin integrity, edema, etc.): Lt KI in place, LE elevated in bed.      Pertinent Vitals/Pain Pain Assessment Pain Assessment: Faces Faces Pain Scale: Hurts little more Pain Location: Lt knee Pain Descriptors / Indicators: Aching Pain Intervention(s): Monitored during session, Repositioned    Home Living                           Prior Function            PT Goals (current goals can now be found in the care plan section) Acute Rehab PT Goals Patient Stated Goal: Get better PT Goal Formulation: With patient Time For Goal Achievement: 11/07/22 Potential to Achieve Goals: Fair Progress towards PT goals: Progressing toward goals    Frequency    Min 3X/week      PT Plan Current plan remains appropriate    Co-evaluation              AM-PAC PT "6 Clicks" Mobility   Outcome Measure  Help needed turning from your back to your side while in a flat bed without using bedrails?: A Little Help needed moving from lying on your back to sitting on the side of a flat bed without using bedrails?: A Little Help needed moving to and from a bed to a chair (including a wheelchair)?: A Lot Help needed standing up from a chair using your arms (e.g., wheelchair or bedside chair)?: A Lot Help needed to walk in hospital room?: Total Help needed climbing 3-5 steps with a railing? : Total 6 Click Score: 12    End of Session Equipment Utilized During Treatment: Gait belt;Left knee immobilizer Activity Tolerance: Patient limited by lethargy Patient left: with call bell/phone within reach;in bed;with bed alarm set   PT Visit Diagnosis: Unsteadiness on feet (R26.81);Repeated falls (R29.6);Muscle weakness (generalized) (M62.81);History of falling (Z91.81);Difficulty in walking, not elsewhere classified (R26.2);Pain Pain - Right/Left: Left Pain - part of body: Knee     Time: 1610-9604 PT Time Calculation (min) (ACUTE ONLY): 10 min  Charges:  $Therapeutic Activity: 8-22 mins                     Erica Ball, PT, DPT [facility]  Rehabilitation Services Physical Therapist Office: [phone] Website: [email or web address]    Berton Mount 10/26/2022, 4:27 PM

## 2022-10-26 NOTE — Progress Notes (Signed)
PROGRESS NOTE    Erica Ball  JYN:[id number] DOB: [birth year] DOA: 10/23/2022 PCP: Norm Salt, PA   Brief Narrative: Erica Ball is a 72 y.o. female with a history of alcohol abuse, tobacco abuse, hypertension, COPD.  Patient presented secondary to a fall and subsequent left leg pain and found to have a left tibial plateau fracture.  Orthopedic surgery was consulted and recommended nonoperative management with application of left knee immobilizer and nonweightbearing to the right lower extremity.  Patient evaluated by PT/OT with recommendation for SNF.   Assessment/Plan:  Left tibial plateau fracture Secondary to fall.  Orthopedic surgery consulted on admission and recommend left the knee immobilizer and nonweightbearing on left leg.  Recommendation to follow-up with Dr. Odis Hollingshead in 2 weeks.  PT and OT recommend skilled nursing facility placement on discharge.  Hyponatremia Potassium was 127 on admission.  Patient with a low to 125 with some improvement with IV fluids.  Urine studies suggest possible dehydration.  Sodium improved to 127.  Lethargy Likely secondary to Librium.  Patient eventually arouses and follows commands/answers questions appropriately. -Hold Librium today and resume tomorrow daily -Obtain ammonia level  Hypomagnesemia Magnesium supplementation given.  Resolved.  Alcohol abuse Patient started on CIWA protocol.  Patient also started on Librium taper.  Local patient is showing signs of significant lethargy concerning for possible overdosing on Librium. -Hold Librium for today and resume tomorrow as daily for next 2 days -Continue CIWA protocol -Continue multivitamin, thiamine, folic acid  Severe asymptomatic hypertension Patient with a history of hypertension but no medications listed as outpatient regimen.  Patient managed on hydralazine and metoprolol this admission. -Continue hydralazine and metoprolol  COPD No.  No  exacerbation. -Continue DuoNeb as needed  Tobacco use -Continue nicotine patch   DVT prophylaxis: Lovenox Code Status:   Code Status: Full Code Family Communication: None at bedside Disposition Plan: Discharge to SNF likely in 24 hours once mental status improved   Consultants:  Orthopedic surgery  Procedures:  None  Antimicrobials: None    Subjective: Patient reports no issues overnight. Nursing concerned that she's been somnolent.  Objective: BP (!) 143/65 (BP Location: Right Arm)   Pulse 66   Temp 98.7 F (37.1 C) (Oral)   Resp 20   Ht 5\' 8"  (1.727 m)   Wt 63.5 kg   SpO2 98%   BMI 21.29 kg/m   Examination:  General exam: Appears calm and comfortable Respiratory system: Clear to auscultation. Respiratory effort normal. Cardiovascular system: S1 & S2 heard, RRR. Gastrointestinal system: Abdomen is nondistended, soft and nontender. Normal bowel sounds heard. Central nervous system: Lethargic but eventually arouses and answers questions appropriately Musculoskeletal: LLE edema. No calf tenderness    Data Reviewed: I have personally reviewed following labs and imaging studies   Last CBC Lab Results  Component Value Date   WBC 9.4 10/26/2022   HGB 13.5 10/26/2022   HCT 39.4 10/26/2022   MCV 98.3 10/26/2022   MCH 33.7 10/26/2022   RDW 12.1 10/26/2022   PLT 272 10/26/2022     Last metabolic panel Lab Results  Component Value Date   GLUCOSE 86 10/26/2022   NA 127 (L) 10/26/2022   K 3.9 10/26/2022   CL 89 (L) 10/26/2022   CO2 26 10/26/2022   BUN 8 10/26/2022   CREATININE 0.54 10/26/2022   GFRNONAA >60 10/26/2022   CALCIUM 8.5 (L) 10/26/2022   PHOS 2.9 10/24/2022   PROT 6.1 (L) 10/24/2022   ALBUMIN 3.4 (L) 10/24/2022  BILITOT 1.2 10/24/2022   ALKPHOS 66 10/24/2022   AST 14 (L) 10/24/2022   ALT 10 10/24/2022   ANIONGAP 12 10/26/2022     Creatinine Clearance: Estimated Creatinine Clearance: 62.8 mL/min (by C-G formula based on SCr of 0.54  mg/dL).  No results found for this or any previous visit (from the past 240 hour(s)).    Radiology Studies: No results found.    LOS: 2 days    Jacquelin Hawking, MD [facility] 10/26/2022, 2:39 PM   If 7PM-7AM, please contact night-coverage [email or web address]

## 2022-10-26 NOTE — Evaluation (Signed)
Clinical/Bedside Swallow Evaluation Patient Details  Name: Erica Ball MRN: [id number] Date of Birth: [birth year]  Today's Date: 10/26/2022 Time: SLP Start Time (ACUTE ONLY): 0948 SLP Stop Time (ACUTE ONLY): 1011 SLP Time Calculation (min) (ACUTE ONLY): 23 min  Past Medical History:  Past Medical History:  Diagnosis Date   Acute kidney injury (HCC) 07/10/2017   Acute respiratory failure with hypoxia (HCC) 07/10/2017   Alcohol abuse    Allergy    Cataract    Diverticulitis 10/30/2011   Elevated troponin 07/10/2017   Hypertension    Lobar pneumonia (HCC) 07/10/2017   Pleural effusion on right 07/10/2017   Sepsis (HCC) 07/10/2017   Tobacco abuse    UTI (urinary tract infection) 07/10/2017   Vitamin D deficiency 10/30/2011   Past Surgical History:  Past Surgical History:  Procedure Laterality Date   ABDOMINAL HYSTERECTOMY     CATARACT EXTRACTION     FRACTURE SURGERY     IR THORACENTESIS ASP PLEURAL SPACE W/IMG GUIDE  07/11/2017   VIDEO ASSISTED THORACOSCOPY (VATS)/EMPYEMA Right 07/12/2017   Procedure: right VIDEO ASSISTED THORACOSCOPY (VATS) for drainage of EMPYEMA and decortication;  Surgeon: Loreli Slot, MD;  Location: [facility] OR;  Service: Thoracic;  Laterality: Right;   VIDEO BRONCHOSCOPY N/A 07/12/2017   Procedure: VIDEO BRONCHOSCOPY;  Surgeon: Loreli Slot, MD;  Location[facility];  Service: Thoracic;  Laterality: N/A;   HPI:  72 year old female admitted 6/18 after fall, found to have left tibial plateau fracture and right metatarsal fracture; CT r/o metatarsal fractures on Rt 6/19. PMHx: alcohol abuse, tobacco abuse, HTN, COPD, mobility issues    Assessment / Plan / Recommendation  Clinical Impression  Pt seated in recliner; lethargic but able to rouse for participation in swallow evaluation.  Retention of applesauce/crushed meds evident along lower gum line/left lateral sulcus.  Oral mechanism exam WFL. Pt demonstrated immediate and consistent coughing when drinking thin  liquids from the edge of a cup. She was unable to draw water through a straw despite repeated attempts. Nectar thick liquids did not elicit coughing.  She demonstrated poor awareness and mastication of cracker, with retention in both cheeks.  Primary obstacle to safe eating and drinking is her mentation.  She is likely aspirating thin liquids. Recommend changing her diet to dysphagia 1/pureed with nectar thick liquids for now.  Sign posted at [facility]. Anticipate improvements as mental status clears. SLP will follow up likely Monday 6/24. SLP Visit Diagnosis: Dysphagia, unspecified (R13.10)    Aspiration Risk  Mild aspiration risk    Diet Recommendation   Dysphagia 1/puree; nectar thick liquids.  Medication Administration: Whole meds with puree    Other  Recommendations Oral Care Recommendations: Oral care BID    Recommendations for follow up therapy are one component of a multi-disciplinary discharge planning process, led by the attending physician.  Recommendations may be updated based on patient status, additional functional criteria and insurance authorization.  Follow up Recommendations No SLP follow up              Frequency and Duration min 2x/week  1 week       Prognosis Prognosis for improved oropharyngeal function: Good Barriers to Reach Goals: Cognitive deficits      Swallow Study   General Date of Onset: 10/24/22 HPI: 72 year old female admitted 6/18 after fall, found to have left tibial plateau fracture and right metatarsal fracture; CT r/o metatarsal fractures on Rt 6/19. PMHx: alcohol abuse, tobacco abuse, HTN, COPD, mobility issues Type of Study: Bedside Swallow  Evaluation Previous Swallow Assessment: no Diet Prior to this Study: Regular;Thin liquids (Level 0) Temperature Spikes Noted: No Respiratory Status: Room air History of Recent Intubation: No Behavior/Cognition: Lethargic/Drowsy Oral Cavity Assessment: Dried secretions Oral Care Completed by SLP:  Yes Self-Feeding Abilities: Needs assist Patient Positioning: Upright in chair Baseline Vocal Quality: Hoarse Volitional Cough: Strong Volitional Swallow: Able to elicit    Oral/Motor/Sensory Function Overall Oral Motor/Sensory Function: Within functional limits   Ice Chips Ice chips: Within functional limits   Thin Liquid Thin Liquid: Impaired Presentation: Straw;Cup Pharyngeal  Phase Impairments: Cough - Immediate    Nectar Thick Nectar Thick Liquid: Within functional limits   Honey Thick Honey Thick Liquid: Not tested   Puree Puree: Within functional limits   Solid     Solid: Impaired Oral Phase Impairments: Impaired mastication;Poor awareness of bolus Oral Phase Functional Implications: Right lateral sulci pocketing;Left lateral sulci pocketing      Blenda Mounts Laurice 10/26/2022,10:19 AM  Marchelle Folks L. Samson Frederic, MA CCC/SLP Clinical Specialist - Acute Care SLP Acute Rehabilitation Services Office number [phone]

## 2022-10-27 DIAGNOSIS — S82142A Displaced bicondylar fracture of left tibia, initial encounter for closed fracture: Secondary | ICD-10-CM | POA: Diagnosis not present

## 2022-10-27 LAB — BASIC METABOLIC PANEL
Anion gap: 10 (ref 5–15)
BUN: 12 mg/dL (ref 8–23)
CO2: 26 mmol/L (ref 22–32)
Calcium: 8.2 mg/dL — ABNORMAL LOW (ref 8.9–10.3)
Chloride: 92 mmol/L — ABNORMAL LOW (ref 98–111)
Creatinine, Ser: 0.6 mg/dL (ref 0.44–1.00)
GFR, Estimated: >60 mL/min (ref >60)
Glucose, Bld: 104 mg/dL — ABNORMAL HIGH (ref 70–99)
Potassium: 3.7 mmol/L (ref 3.5–5.1)
Sodium: 128 mmol/L — ABNORMAL LOW (ref 135–145)

## 2022-10-27 LAB — GLUCOSE, CAPILLARY
Glucose-Capillary: 95 mg/dL (ref 70–99)
Glucose-Capillary: 96 mg/dL (ref 70–99)
Glucose-Capillary: 85 mg/dL (ref 70–99)

## 2022-10-27 MED ORDER — SODIUM CHLORIDE 0.9 % IV BOLUS
1000.0000 mL | Freq: Once | INTRAVENOUS | Status: AC
  Administered 2022-10-27: 1000 mL via INTRAVENOUS

## 2022-10-27 NOTE — Progress Notes (Signed)
PROGRESS NOTE    Erica Ball  ZOX:[id number] DOB: July 10, 1950 DOA: 10/23/2022 PCP: Norm Salt, PA   Brief Narrative: Erica Ball is a 72 y.o. female with a history of alcohol abuse, tobacco abuse, hypertension, COPD.  Patient presented secondary to a fall and subsequent left leg pain and found to have a left tibial plateau fracture.  Orthopedic surgery was consulted and recommended nonoperative management with application of left knee immobilizer and nonweightbearing to the right lower extremity.  Patient evaluated by PT/OT with recommendation for SNF.   Assessment/Plan:  Left tibial plateau fracture Secondary to fall.  Orthopedic surgery consulted on admission and recommend left the knee immobilizer and nonweightbearing on left leg.  Recommendation to follow-up with Dr. Odis Hollingshead in 2 weeks.  PT and OT recommend skilled nursing facility placement on discharge.  Hyponatremia Potassium was 127 on admission.  Patient with a low to 125 with some improvement with IV fluids.  Urine studies suggest possible dehydration.  Sodium improved to 128.  Lethargy Possibly related to Librium. Improved today. -Hold Librium  Hypomagnesemia Magnesium supplementation given.  Resolved.  Alcohol abuse Patient started on CIWA protocol.  Patient also started on Librium taper.  Local patient is showing signs of significant lethargy concerning for possible overdosing on Librium. -Hold Librium -Continue CIWA protocol -Continue multivitamin, thiamine, folic acid  Severe asymptomatic hypertension Patient with a history of hypertension but no medications listed as outpatient regimen.  Patient managed on hydralazine and metoprolol this admission. -Continue hydralazine and metoprolol  COPD Stable. No exacerbation. -Continue DuoNeb as needed  Tobacco use -Continue nicotine patch   DVT prophylaxis: Lovenox Code Status:   Code Status: Full Code Family Communication: None at  bedside Disposition Plan: Discharge to SNF once bed is available. Medically stable for discharge.   Consultants:  Orthopedic surgery  Procedures:  None  Antimicrobials: None    Subjective: Per nursing, patient was very sleepy overnight. Patient reports no issues this morning.  Objective: BP (!) 168/81 (BP Location: Right Arm)   Pulse 66   Temp 97.6 F (36.4 C) (Oral)   Resp 20   Ht 5\' 8"  (1.727 m)   Wt 63.5 kg   SpO2 98%   BMI 21.29 kg/m   Examination:  General exam: Appears calm and comfortable Respiratory system: Clear to auscultation. Respiratory effort normal. Cardiovascular system: S1 & S2 heard, Normal rate with regular rhythm. Gastrointestinal system: Abdomen is nondistended, soft and nontender. No organomegaly or masses felt. Normal bowel sounds heard. Central nervous system: Alert and oriented. Slow speech. Musculoskeletal: No calf tenderness Psychiatry: Judgement and insight appear normal. Mood & affect appropriate.     Data Reviewed: I have personally reviewed following labs and imaging studies   Last CBC Lab Results  Component Value Date   WBC 9.4 10/26/2022   HGB 13.5 10/26/2022   HCT 39.4 10/26/2022   MCV 98.3 10/26/2022   MCH 33.7 10/26/2022   RDW 12.1 10/26/2022   PLT 272 10/26/2022     Last metabolic panel Lab Results  Component Value Date   GLUCOSE 104 (H) 10/27/2022   NA 128 (L) 10/27/2022   K 3.7 10/27/2022   CL 92 (L) 10/27/2022   CO2 26 10/27/2022   BUN 12 10/27/2022   CREATININE 0.60 10/27/2022   GFRNONAA >60 10/27/2022   CALCIUM 8.2 (L) 10/27/2022   PHOS 2.9 10/24/2022   PROT 6.1 (L) 10/24/2022   ALBUMIN 3.4 (L) 10/24/2022   BILITOT 1.2 10/24/2022   ALKPHOS 66  10/24/2022   AST 14 (L) 10/24/2022   ALT 10 10/24/2022   ANIONGAP 10 10/27/2022     Creatinine Clearance: Estimated Creatinine Clearance: 62.8 mL/min (by C-G formula based on SCr of 0.6 mg/dL).  No results found for this or any previous visit (from the past  240 hour(s)).    Radiology Studies: No results found.    LOS: 3 days    Jacquelin Hawking, MD [facility] 10/27/2022, 1:34 PM   If 7PM-7AM, please contact night-coverage [email or web address]

## 2022-10-27 NOTE — TOC Progression Note (Signed)
Transition of Care [facility]) - Progression Note    Patient Details  Name: Erica Ball MRN: [id number] Date of Birth: 10/28/1950  [facility]) CM/SW Contact  Carmina Miller, [place] Phone Number: 10/27/2022, 2:04 PM  Clinical Narrative:     Per MD pt ready for dc, per CSW notes pt going to [facility], bed available on Monday. CSW initiated [place] with a start date of 10/29/22, ref number [id number].   Expected Discharge Plan: Skilled Nursing Facility Barriers to Discharge: Continued Medical Work up, SNF Pending bed offer  Expected Discharge Plan and Services In-house Referral: Clinical Social Work   Post Acute Care Choice: Skilled Nursing Facility Living arrangements for the past 2 months: Single Family Home                                       Social Determinants of Health (SDOH) Interventions SDOH Screenings   Food Insecurity: No Food Insecurity (10/24/2022)  Housing: Low Risk  (10/24/2022)  Transportation Needs: No Transportation Needs (10/24/2022)  Utilities: Not At Risk (10/24/2022)  Tobacco Use: High Risk (10/23/2022)    Readmission Risk Interventions     No data to display

## 2022-10-27 NOTE — Plan of Care (Signed)
  Problem: Coping: Goal: Ability to adjust to condition or change in health will improve Outcome: Progressing   Problem: Nutritional: Goal: Maintenance of adequate nutrition will improve Outcome: Progressing   Problem: Tissue Perfusion: Goal: Adequacy of tissue perfusion will improve Outcome: Progressing   Problem: Skin Integrity: Goal: Risk for impaired skin integrity will decrease Outcome: Progressing   Problem: Education: Goal: Knowledge of General Education information will improve Description: Including pain rating scale, medication(s)/side effects and non-pharmacologic comfort measures Outcome: Progressing   Problem: Nutrition: Goal: Adequate nutrition will be maintained Outcome: Progressing   Problem: Pain Managment: Goal: General experience of comfort will improve Outcome: Progressing   Problem: Safety: Goal: Ability to remain free from injury will improve Outcome: Progressing   Problem: Skin Integrity: Goal: Risk for impaired skin integrity will decrease Outcome: Progressing

## 2022-10-27 NOTE — Progress Notes (Signed)
Pt remained lethargic overnight, was able to eat 25% dinner tray with family @1930 , before falling asleep again. Pt slightly more awake this am and oriented, states she is hungry and ready to eat. Pt has wet,weak, congested cough with diminished breath sounds. Pt encouraged to cough and deep breath. O2 98% RA, afebrile. Aspiration precautions maintained with PO. Urine is cloudy and malodorous. Pt denies burning or discomfort upon urination. Pericare performed for hygiene.

## 2022-10-28 ENCOUNTER — Inpatient Hospital Stay (HOSPITAL_COMMUNITY): Payer: Medicare HMO

## 2022-10-28 DIAGNOSIS — S82142A Displaced bicondylar fracture of left tibia, initial encounter for closed fracture: Secondary | ICD-10-CM | POA: Diagnosis not present

## 2022-10-28 LAB — BASIC METABOLIC PANEL
Anion gap: 10 (ref 5–15)
BUN: 9 mg/dL (ref 8–23)
CO2: 27 mmol/L (ref 22–32)
Calcium: 8.7 mg/dL — ABNORMAL LOW (ref 8.9–10.3)
Chloride: 92 mmol/L — ABNORMAL LOW (ref 98–111)
Creatinine, Ser: 0.51 mg/dL (ref 0.44–1.00)
GFR, Estimated: >60 mL/min (ref >60)
Glucose, Bld: 106 mg/dL — ABNORMAL HIGH (ref 70–99)
Potassium: 3.8 mmol/L (ref 3.5–5.1)
Sodium: 129 mmol/L — ABNORMAL LOW (ref 135–145)

## 2022-10-28 LAB — GLUCOSE, CAPILLARY
Glucose-Capillary: 106 mg/dL — ABNORMAL HIGH (ref 70–99)
Glucose-Capillary: 133 mg/dL — ABNORMAL HIGH (ref 70–99)
Glucose-Capillary: 93 mg/dL (ref 70–99)
Glucose-Capillary: 99 mg/dL (ref 70–99)

## 2022-10-28 LAB — GLUCOSE, RANDOM: Glucose, Bld: 104 mg/dL — ABNORMAL HIGH (ref 70–99)

## 2022-10-28 NOTE — Progress Notes (Signed)
PROGRESS NOTE    Erica Ball  ZOX:[id number] DOB: 02-23-1951 DOA: 10/23/2022 PCP: Norm Salt, PA   Brief Narrative: Erica Ball is a 72 y.o. female with a history of alcohol abuse, tobacco abuse, hypertension, COPD.  Patient presented secondary to a fall and subsequent left leg pain and found to have a left tibial plateau fracture.  Orthopedic surgery was consulted and recommended nonoperative management with application of left knee immobilizer and nonweightbearing to the right lower extremity.  Patient evaluated by PT/OT with recommendation for SNF.   Assessment/Plan:  Left tibial plateau fracture Secondary to fall.  Orthopedic surgery consulted on admission and recommend left the knee immobilizer and nonweightbearing on left leg.  Recommendation to follow-up with Dr. Odis Hollingshead in 2 weeks.  PT and OT recommend skilled nursing facility placement on discharge.  Hyponatremia Potassium was 127 on admission.  Patient with a low to 125 with some improvement with IV fluids.  Urine studies suggest possible dehydration.  Sodium improved to 128.  Lethargy Possibly related to Librium. Seems to have waxed and waned. Seems more significant today. Blood pressure is better controlled. -CT head -Check glucose  Hypomagnesemia Magnesium supplementation given.  Resolved.  Alcohol abuse Patient started on CIWA protocol.  Patient also started on Librium taper.  Local patient is showing signs of significant lethargy concerning for possible overdosing on Librium. -Hold Librium -Continue CIWA protocol -Continue multivitamin, thiamine, folic acid  Severe asymptomatic hypertension Patient with a history of hypertension but no medications listed as outpatient regimen.  Patient managed on hydralazine and metoprolol this admission. -Continue hydralazine and metoprolol  COPD Stable. No exacerbation. -Continue DuoNeb as needed  Tobacco use -Continue nicotine patch   DVT  prophylaxis: Lovenox Code Status:   Code Status: Full Code Family Communication: None at bedside Disposition Plan: Discharge to SNF once bed is available.   Consultants:  Orthopedic surgery  Procedures:  None  Antimicrobials: None    Subjective: Patient feels week today. Also with right sided neck pain.   Objective: BP (!) 198/88 (BP Location: Right Arm)   Pulse 72   Temp 97.8 F (36.6 C) (Axillary)   Resp 20   Ht 5\' 8"  (1.727 m)   Wt 63.5 kg   SpO2 97%   BMI 21.29 kg/m   Examination:  General exam: Appears calm and comfortable Respiratory system: Clear to auscultation. Respiratory effort normal. Cardiovascular system: S1 & S2 heard, RRR. Gastrointestinal system: Abdomen is nondistended, soft and nontender. Normal bowel sounds heard. Central nervous system: Alert and oriented to person, place and time. Dysarthria.  Left foot drop. No pronator drift. 4/5 bilateral upper extremity strength. 3/5 RLE and 2/5 LLE strength. Musculoskeletal: LLE edema. Left foot drop. Left ankle with lateral area of swelling Skin: No cyanosis. No rashes Psychiatry: Judgement and insight appear normal. Mood & affect appropriate.     Data Reviewed: I have personally reviewed following labs and imaging studies   Last CBC Lab Results  Component Value Date   WBC 9.4 10/26/2022   HGB 13.5 10/26/2022   HCT 39.4 10/26/2022   MCV 98.3 10/26/2022   MCH 33.7 10/26/2022   RDW 12.1 10/26/2022   PLT 272 10/26/2022     Last metabolic panel Lab Results  Component Value Date   GLUCOSE 104 (H) 10/27/2022   NA 128 (L) 10/27/2022   K 3.7 10/27/2022   CL 92 (L) 10/27/2022   CO2 26 10/27/2022   BUN 12 10/27/2022   CREATININE 0.60 10/27/2022  GFRNONAA >60 10/27/2022   CALCIUM 8.2 (L) 10/27/2022   PHOS 2.9 10/24/2022   PROT 6.1 (L) 10/24/2022   ALBUMIN 3.4 (L) 10/24/2022   BILITOT 1.2 10/24/2022   ALKPHOS 66 10/24/2022   AST 14 (L) 10/24/2022   ALT 10 10/24/2022   ANIONGAP 10  10/27/2022     Creatinine Clearance: Estimated Creatinine Clearance: 62.8 mL/min (by C-G formula based on SCr of 0.6 mg/dL).  No results found for this or any previous visit (from the past 240 hour(s)).    Radiology Studies: No results found.    LOS: 4 days    Jacquelin Hawking, MD [facility] 10/28/2022, 11:33 AM   If 7PM-7AM, please contact night-coverage [email or web address]

## 2022-10-28 NOTE — TOC Progression Note (Signed)
Transition of Care [facility]) - Progression Note    Patient Details  Name: Erica Ball MRN: [id number] Date of Birth: 10/08/1950  Transition of Care [facility]) CM/SW Contact  Carmina Miller, [place] Phone Number: 10/28/2022, 8:55 AM  Clinical Narrative:     [facility] [place] approved with a start date of 10/27/22, review date of 10/30/22, approval number [id number].  Expected Discharge Plan: Skilled Nursing Facility Barriers to Discharge: Continued Medical Work up, SNF Pending bed offer  Expected Discharge Plan and Services In-house Referral: Clinical Social Work   Post Acute Care Choice: Skilled Nursing Facility Living arrangements for the past 2 months: Single Family Home                                       Social Determinants of Health (SDOH) Interventions SDOH Screenings   Food Insecurity: No Food Insecurity (10/24/2022)  Housing: Low Risk  (10/24/2022)  Transportation Needs: No Transportation Needs (10/24/2022)  Utilities: Not At Risk (10/24/2022)  Tobacco Use: High Risk (10/23/2022)    Readmission Risk Interventions     No data to display

## 2022-10-29 ENCOUNTER — Inpatient Hospital Stay (HOSPITAL_COMMUNITY): Payer: Medicare HMO

## 2022-10-29 DIAGNOSIS — S82142A Displaced bicondylar fracture of left tibia, initial encounter for closed fracture: Secondary | ICD-10-CM | POA: Diagnosis not present

## 2022-10-29 DIAGNOSIS — I639 Cerebral infarction, unspecified: Secondary | ICD-10-CM | POA: Diagnosis not present

## 2022-10-29 LAB — BLOOD GAS, ARTERIAL
Acid-Base Excess: 8.3 mmol/L — ABNORMAL HIGH (ref 0.0–2.0)
Bicarbonate: 34 mmol/L — ABNORMAL HIGH (ref 20.0–28.0)
Drawn by: 365271
O2 Saturation: 95.4 %
Patient temperature: 37
pCO2 arterial: 50 mmHg — ABNORMAL HIGH (ref 32–48)
pH, Arterial: 7.44 (ref 7.35–7.45)
pO2, Arterial: 63 mmHg — ABNORMAL LOW (ref 83–108)

## 2022-10-29 LAB — BASIC METABOLIC PANEL
Anion gap: 9 (ref 5–15)
BUN: 11 mg/dL (ref 8–23)
CO2: 27 mmol/L (ref 22–32)
Calcium: 9 mg/dL (ref 8.9–10.3)
Chloride: 94 mmol/L — ABNORMAL LOW (ref 98–111)
Creatinine, Ser: 0.53 mg/dL (ref 0.44–1.00)
GFR, Estimated: >60 mL/min (ref >60)
Glucose, Bld: 127 mg/dL — ABNORMAL HIGH (ref 70–99)
Potassium: 3.8 mmol/L (ref 3.5–5.1)
Sodium: 130 mmol/L — ABNORMAL LOW (ref 135–145)

## 2022-10-29 LAB — CBC
HCT: 38.9 % (ref 36.0–46.0)
Hemoglobin: 13.3 g/dL (ref 12.0–15.0)
MCH: 33.8 pg (ref 26.0–34.0)
MCHC: 34.2 g/dL (ref 30.0–36.0)
MCV: 98.7 fL (ref 80.0–100.0)
Platelets: 322 10*3/uL (ref 150–400)
RBC: 3.94 MIL/uL (ref 3.87–5.11)
RDW: 12.1 % (ref 11.5–15.5)
WBC: 6.9 10*3/uL (ref 4.0–10.5)
nRBC: 0 % (ref 0.0–0.2)

## 2022-10-29 LAB — C-REACTIVE PROTEIN: CRP: 5.8 mg/dL — ABNORMAL HIGH (ref <1.0)

## 2022-10-29 LAB — SEDIMENTATION RATE: Sed Rate: 39 mm/hr — ABNORMAL HIGH (ref 0–22)

## 2022-10-29 LAB — VITAMIN B12: Vitamin B-12: 591 pg/mL (ref 180–914)

## 2022-10-29 LAB — GLUCOSE, CAPILLARY
Glucose-Capillary: 118 mg/dL — ABNORMAL HIGH (ref 70–99)
Glucose-Capillary: 120 mg/dL — ABNORMAL HIGH (ref 70–99)

## 2022-10-29 MED ORDER — ASPIRIN 81 MG PO TBEC: 81.0000 mg | DELAYED_RELEASE_TABLET | Freq: Every day | ORAL | Status: DC

## 2022-10-29 MED ORDER — STROKE: EARLY STAGES OF RECOVERY BOOK
Freq: Once | Status: AC
  Filled 2022-10-29: qty 1

## 2022-10-29 MED ORDER — HYDRALAZINE HCL 50 MG PO TABS
50.0000 mg | ORAL_TABLET | Freq: Three times a day (TID) | ORAL | Status: DC
  Administered 2022-10-30 – 2022-11-01 (×6): 50 mg via ORAL
  Filled 2022-10-29 (×6): qty 1

## 2022-10-29 MED ORDER — SODIUM CHLORIDE 0.9 % IV SOLN: INTRAVENOUS | Status: AC

## 2022-10-29 MED ORDER — HYDRALAZINE HCL 20 MG/ML IJ SOLN
5.0000 mg | Freq: Three times a day (TID) | INTRAMUSCULAR | Status: AC | PRN
  Administered 2022-10-29 – 2022-11-02 (×2): 5 mg via INTRAVENOUS
  Filled 2022-10-29 (×2): qty 1

## 2022-10-29 MED ORDER — LOSARTAN POTASSIUM 25 MG PO TABS
25.0000 mg | ORAL_TABLET | Freq: Every day | ORAL | Status: DC
  Administered 2022-10-29 – 2022-11-02 (×4): 25 mg via ORAL
  Filled 2022-10-29 (×4): qty 1

## 2022-10-29 MED ORDER — ASPIRIN 81 MG PO CHEW
81.0000 mg | CHEWABLE_TABLET | Freq: Every day | ORAL | Status: DC
  Administered 2022-10-31 – 2022-11-06 (×7): 81 mg via ORAL
  Filled 2022-10-29 (×7): qty 1

## 2022-10-29 NOTE — Progress Notes (Addendum)
PROGRESS NOTE    Erica Ball  WUJ:[id number] DOB: March 30, 1951 DOA: 10/23/2022 PCP: Norm Salt, PA   Brief Narrative: Erica Ball is a 72 y.o. female with a history of alcohol abuse, tobacco abuse, hypertension, COPD.  Patient presented secondary to a fall and subsequent left leg pain and found to have a left tibial plateau fracture.  Orthopedic surgery was consulted and recommended nonoperative management with application of left knee immobilizer and nonweightbearing to the right lower extremity.  Patient evaluated by PT/OT with recommendation for SNF.  Patient developed progressively worsening lethargy prior to discharge.   Assessment/Plan:  Left tibial plateau fracture Secondary to fall.  Orthopedic surgery consulted on admission and recommend left the knee immobilizer and nonweightbearing on left leg.  Recommendation to follow-up with Dr. Odis Hollingshead in 2 weeks.  PT and OT recommend skilled nursing facility placement on discharge.  Left ankle fracture Noted on CT imaging and likely related to recent fall. -Orthopedic surgery recommendations  Hyponatremia Potassium was 127 on admission.  Patient with a low to 125 with some improvement with IV fluids.  Urine studies suggest possible dehydration.  Sodium improved to 128.  Lethargy Initially thought related to Librium. Progressively worsened and now persistent. Librium last given on 6/20. CT head unremarkable. No fevers. TSH normal. No hypoglycemia. This seems likely to be metabolic in etiology, but unsure of exact etiology. -ABG -MRI brain -Vitamin B12, B1, CRP, ESR -CBC  Addendum: MRI significant for acute/subacute infarcts. Neurology consulted. Transthoracic Echocardiogram ordered. SLP evaluation.  Hypomagnesemia Magnesium supplementation given.  Resolved.  Alcohol abuse Patient started on CIWA protocol.  Patient also started on Librium taper.  Local patient is showing signs of significant lethargy  concerning for possible overdosing on Librium. -Hold Librium -Continue CIWA protocol -Continue multivitamin, thiamine, folic acid  Severe asymptomatic hypertension Patient with a history of hypertension but no medications listed as outpatient regimen.  Patient managed on hydralazine and metoprolol this admission. -Continue hydralazine and discontinue metoprolol -Start losartan  COPD Stable. No exacerbation. -Continue DuoNeb as needed  Tobacco use -Continue nicotine patch   DVT prophylaxis: Lovenox Code Status:   Code Status: Full Code Family Communication: None at bedside Disposition Plan: Discharge to SNF once bed is available and pending improvement of lethargy   Consultants:  Orthopedic surgery  Procedures:  None  Antimicrobials: None    Subjective: Patient continues to feel weak.  She is alert and oriented but states she has never felt this week before.  No other concerns.  Objective: BP (!) 168/84 (BP Location: Right Arm)   Pulse 68   Temp 98 F (36.7 C) (Oral)   Resp 20   Ht 5\' 8"  (1.727 m)   Wt 63.5 kg   SpO2 96%   BMI 21.29 kg/m   Examination:  General exam: Appears calm and comfortable Respiratory system: Clear to auscultation. Respiratory effort normal. Cardiovascular system: S1 & S2 heard, RRR. Gastrointestinal system: Abdomen is nondistended, soft and nontender. Normal bowel sounds heard. Central nervous system: Lethargic but otherwise alert and oriented. Musculoskeletal: Left lower extremity edema with swelling noted at left lateral ankle.  No calf tenderness   Data Reviewed: I have personally reviewed following labs and imaging studies   Last CBC Lab Results  Component Value Date   WBC 9.4 10/26/2022   HGB 13.5 10/26/2022   HCT 39.4 10/26/2022   MCV 98.3 10/26/2022   MCH 33.7 10/26/2022   RDW 12.1 10/26/2022   PLT 272 10/26/2022  Last metabolic panel Lab Results  Component Value Date   GLUCOSE 127 (H) 10/29/2022   NA 130  (L) 10/29/2022   K 3.8 10/29/2022   CL 94 (L) 10/29/2022   CO2 27 10/29/2022   BUN 11 10/29/2022   CREATININE 0.53 10/29/2022   GFRNONAA >60 10/29/2022   CALCIUM 9.0 10/29/2022   PHOS 2.9 10/24/2022   PROT 6.1 (L) 10/24/2022   ALBUMIN 3.4 (L) 10/24/2022   BILITOT 1.2 10/24/2022   ALKPHOS 66 10/24/2022   AST 14 (L) 10/24/2022   ALT 10 10/24/2022   ANIONGAP 9 10/29/2022     Creatinine Clearance: Estimated Creatinine Clearance: 62.8 mL/min (by C-G formula based on SCr of 0.53 mg/dL).  No results found for this or any previous visit (from the past 240 hour(s)).    Radiology Studies: CT ANKLE LEFT WO CONTRAST  Result Date: 10/28/2022 CLINICAL DATA:  Ankle trauma, fracture, xray done (Age >= 5y) EXAM: CT OF THE LEFT ANKLE WITHOUT CONTRAST TECHNIQUE: Multidetector CT imaging of the left ankle was performed according to the standard protocol. Multiplanar CT image reconstructions were also generated. RADIATION DOSE REDUCTION: This exam was performed according to the departmental dose-optimization program which includes automated exposure control, adjustment of the mA and/or kV according to patient size and/or use of iterative reconstruction technique. COMPARISON:  X-ray 10/28/2022 FINDINGS: Bones/Joint/Cartilage Diffuse osseous demineralization. Subacute-appearing healing nondisplaced fracture of the lateral malleolus (series 4, images 37-39). No medial or posterior malleolar fracture. Ankle mortise is congruent without dislocation. Bones of the hindfoot are intact. Joint spaces are relatively well preserved. No erosion. No bone lesion. Ligaments Suboptimally assessed by CT. Muscles and Tendons No acute musculotendinous abnormality by CT. Soft tissues Soft tissue swelling most pronounced at the lateral aspect of the ankle. No organized hematoma. IMPRESSION: 1. Subacute-appearing healing nondisplaced fracture of the lateral malleolus. 2. Soft tissue swelling most pronounced at the lateral aspect of  the ankle. Electronically Signed   By: Duanne Guess D.O.   On: 10/28/2022 16:30   CT HEAD WO CONTRAST ( )  Result Date: 10/28/2022 CLINICAL DATA:  Altered level of consciousness EXAM: CT HEAD WITHOUT CONTRAST TECHNIQUE: Contiguous axial images were obtained from the base of the skull through the vertex without intravenous contrast. RADIATION DOSE REDUCTION: This exam was performed according to the departmental dose-optimization program which includes automated exposure control, adjustment of the mA and/or kV according to patient size and/or use of iterative reconstruction technique. COMPARISON:  03/08/2006 FINDINGS: Brain: Scattered hypodensities are seen throughout the periventricular and subcortical white matter and right-sided pons, consistent with age-indeterminate small vessel ischemic changes. No other signs of acute infarct or hemorrhage. Lateral ventricles and midline structures are unremarkable. No acute extra-axial fluid collections. No mass effect. Vascular: No hyperdense vessel or unexpected calcification. Skull: Normal. Negative for fracture or focal lesion. Sinuses/Orbits: No acute finding. Other: None. IMPRESSION: 1. Age-indeterminate small-vessel ischemic changes throughout the periventricular and subcortical white matter and right-sided pons, favor chronic. 2. No acute hemorrhage. Electronically Signed   By: Sharlet Salina M.D.   On: 10/28/2022 14:38   DG Ankle Complete Left  Result Date: 10/28/2022 CLINICAL DATA:  Fall with ankle swelling EXAM: LEFT ANKLE COMPLETE - 3 VIEW COMPARISON:  None Available. FINDINGS: Apparent cortical discontinuity along the lateral and posterior aspect of the distal fibular diaphysis. Diffuse osteopenia. Diffuse soft tissue edema about the ankle. IMPRESSION: Apparent cortical discontinuity along the lateral and posterior aspect of the distal fibular diaphysis, which may represent a nondisplaced fracture. Electronically Signed  By: Agustin Cree M.D.   On:  10/28/2022 13:01      LOS: 5 days    Jacquelin Hawking, MD [facility] 10/29/2022, 12:50 PM   If 7PM-7AM, please contact night-coverage [email or web address]

## 2022-10-29 NOTE — Consult Note (Cosign Needed)
Neurology Consultation  Reason for Consult: Stroke on MRI brain  Referring Physician: Dr. Caleb Popp   CC: fall   History is obtained from:medical record   HPI: Erica Ball is a 72 y.o. female with past medical history of ETOH abuse, HTN, tobacco use, Vit d deficiency, COPD who presented after experiencing a fall trying to back up to the couch with her walker. Xrays revealed a left tibial plateau fracture. She was placed on CIWA protocol with a Librium taper and was discontinued on 6/20. Due to persistent lethargy an MRI brain was obtained and revealed scattered acute/subacute ischemic infarcts bilaterally Neurology consulted for stroke workup  LKW: unclear  IV thrombolysis given?: no, outside window  EVT:  No LVO  reason Premorbid modified Rankin scale (mRS):  3-Moderate disability-requires help but walks WITHOUT assistance  ROS: Full ROS was performed and is negative except as noted in the HPI.    Past Medical History:  Diagnosis Date   Acute kidney injury (HCC) 07/10/2017   Acute respiratory failure with hypoxia (HCC) 07/10/2017   Alcohol abuse    Allergy    Cataract    Diverticulitis 10/30/2011   Elevated troponin 07/10/2017   Hypertension    Lobar pneumonia (HCC) 07/10/2017   Pleural effusion on right 07/10/2017   Sepsis (HCC) 07/10/2017   Tobacco abuse    UTI (urinary tract infection) 07/10/2017   Vitamin D deficiency 10/30/2011     Family History  Problem Relation Age of Onset   Hypertension Mother    Heart disease Mother    Hypertension Father    Heart disease Father    Stroke Brother    Hypertension Brother    Heart disease Brother      Social History:   reports that she has been smoking cigarettes. She has a 79.50 pack-year smoking history. She has never used smokeless tobacco. She reports current alcohol use of about 21.0 standard drinks of alcohol per week. She reports that she does not use drugs.  Medications  Current Facility-Administered Medications:     [START ON 10/30/2022]  stroke: early stages of recovery book, , Does not apply, Once, Narda Bonds, MD   aspirin chewable tablet 81 mg, 81 mg, Oral, Daily, Nettey, Howell Pringle, MD   enoxaparin (LOVENOX) injection 40 mg, 40 mg, Subcutaneous, Q24H, Crosley, Debby, MD, 40 mg at 10/29/22 0013   feeding supplement (ENSURE ENLIVE / ENSURE PLUS) liquid 237 mL, 237 mL, Oral, BID BM, Arnetha Courser, MD, 237 mL at 10/29/22 1412   folic acid (FOLVITE) tablet 1 mg, 1 mg, Oral, Daily, Rondel Baton, MD, 1 mg at 10/29/22 4098   food thickener (SIMPLYTHICK (NECTAR/LEVEL 2/MILDLY THICK)) 10 packet, 10 packet, Oral, PRN, Narda Bonds, MD   hydrALAZINE (APRESOLINE) tablet 50 mg, 50 mg, Oral, Q8H, Narda Bonds, MD, 50 mg at 10/29/22 1412   HYDROmorphone (DILAUDID) injection 0.5 mg, 0.5 mg, Intravenous, Q3H PRN, Crosley, Debby, MD   ipratropium-albuterol (DUONEB) 0.5-2.5 (3) MG/3ML nebulizer solution 3 mL, 3 mL, Nebulization, Q4H PRN, Crosley, Debby, MD   losartan (COZAAR) tablet 25 mg, 25 mg, Oral, Daily, Narda Bonds, MD, 25 mg at 10/29/22 0859   metoprolol tartrate (LOPRESSOR) injection 5 mg, 5 mg, Intravenous, Q4H PRN, Crosley, Debby, MD, 5 mg at 10/29/22 0458   multivitamin with minerals tablet 1 tablet, 1 tablet, Oral, Daily, Rondel Baton, MD, 1 tablet at 10/29/22 0859   nicotine (NICODERM CQ - dosed in mg/24 hours) patch 14 mg, 14 mg,  Transdermal, Daily, Crosley, Debby, MD   ondansetron (ZOFRAN) tablet 4 mg, 4 mg, Oral, Q6H PRN **OR** ondansetron (ZOFRAN) injection 4 mg, 4 mg, Intravenous, Q6H PRN, Joneen Roach, Debby, MD   senna-docusate (Senokot-S) tablet 1 tablet, 1 tablet, Oral, QHS PRN, Joneen Roach, Debby, MD   thiamine (VITAMIN B1) tablet 100 mg, 100 mg, Oral, Daily, 100 mg at 10/29/22 0859 **OR** thiamine (VITAMIN B1) injection 100 mg, 100 mg, Intravenous, Daily, Rondel Baton, MD   Exam: Current vital signs: BP (!) 191/97   Pulse 68   Temp 98 F (36.7 C) (Oral)   Resp 19   Ht 5\' 8"   (1.727 m)   Wt 63.5 kg   SpO2 96%   BMI 21.29 kg/m  Vital signs in last 24 hours: Temp:  [98 F (36.7 C)] 98 F (36.7 C) (06/24 0802) Pulse Rate:  [65-68] 68 (06/24 0802) Resp:  [14-21] 19 (06/24 1320) BP: (148-202)/(66-102) 191/97 (06/24 1320) SpO2:  [96 %-97 %] 96 % (06/24 0802)  GENERAL: drowsy in NAD  HEENT: - Normocephalic and atraumatic, dry mm LUNGS - Clear to auscultation bilaterally with no wheezes CV - S1S2 RRR, no m/r/g, equal pulses bilaterally. ABDOMEN - Soft, nontender, nondistended with normoactive BS Ext: warm, well perfused, intact peripheral pulses, swelling at left ankle edema  NEURO:  Mental Status: AA&Ox3, to name, age, year, president of [place] and place. Unable to state correct month  Language: speech is slightly dysarthric.  Naming, repetition, fluency, and comprehension intact. Cranial Nerves: PERRL EOMI, visual fields full, left facial asymmetry, facial sensation intact, hearing intact, tongue/uvula/soft palate midline, normal sternocleidomastoid and trapezius muscle strength. No evidence of tongue atrophy or fibrillations Motor: 4/5 in bilateral uppers and right lower, left leg due to injury unable to lift leg off bed, can wiggle toes  Tone: is normal and bulk is normal Sensation- Intact to light touch bilaterally Coordination: FTN intact bilaterally, no ataxia in BLE. Gait- deferred  NIHSS 1a Level of Conscious.: 1 1b LOC Questions: 1 1c LOC Commands: 0 2 Best Gaze: 0 3 Visual: 0 4 Facial Palsy: 2 5a Motor Arm - left: 0 5b Motor Arm - Right: 0 6a Motor Leg - Left: 3 6b Motor Leg - Right: 0 7 Limb Ataxia: 0 8 Sensory: 0 9 Best Language: 0 10 Dysarthria: 1 11 Extinct. and Inatten.: 0 TOTAL: 5   Labs I have reviewed labs in epic and the results pertinent to this consultation are:  CBC    Component Value Date/Time   WBC 6.9 10/29/2022 1325   RBC 3.94 10/29/2022 1325   HGB 13.3 10/29/2022 1325   HCT 38.9 10/29/2022 1325   PLT 322  10/29/2022 1325   MCV 98.7 10/29/2022 1325   MCV 105.7 (A) 07/01/2012 1623   MCH 33.8 10/29/2022 1325   MCHC 34.2 10/29/2022 1325   RDW 12.1 10/29/2022 1325   LYMPHSABS 1.0 10/24/2022 0051   MONOABS 0.9 10/24/2022 0051   EOSABS 0.0 10/24/2022 0051   BASOSABS 0.1 10/24/2022 0051    CMP     Component Value Date/Time   NA 130 (L) 10/29/2022 0746   K 3.8 10/29/2022 0746   CL 94 (L) 10/29/2022 0746   CO2 27 10/29/2022 0746   GLUCOSE 127 (H) 10/29/2022 0746   BUN 11 10/29/2022 0746   CREATININE 0.53 10/29/2022 0746   CREATININE 0.51 10/11/2011 1529   CALCIUM 9.0 10/29/2022 0746   PROT 6.1 (L) 10/24/2022 0052   ALBUMIN 3.4 (L) 10/24/2022 0052   AST 14 (  L) 10/24/2022 0052   ALT 10 10/24/2022 0052   ALKPHOS 66 10/24/2022 0052   BILITOT 1.2 10/24/2022 0052   GFRNONAA >60 10/29/2022 0746   GFRAA >60 07/17/2017 0400    Lipid Panel     Component Value Date/Time   CHOL 88 07/11/2017 0304   TRIG 47 07/11/2017 0304   HDL 41 07/11/2017 0304   CHOLHDL 2.1 07/11/2017 0304   VLDL 9 07/11/2017 0304   LDLCALC 38 07/11/2017 0304    A1c 4.7  Imaging I have reviewed the images obtained:  MRI examination of the brain- scattered acute/subacute ischemic infarcts bilaterally  Assessment:  72 y.o. female with past medical history of ETOH abuse, HTN, tobacco use, Vit d deficiency, COPD who presented after experiencing a fall trying to back up to the couch with her walker.  Due to persistent lethargy an MRI brain was obtained and revealed scattered acute/subacute ischemic infarcts bilaterally  Impression: Scattered bilateral acute/subacute ischemic infarcts   Recommendations: -  fasting lipid panel - Frequent neuro checks - Echocardiogram - CTA head and neck - Prophylactic therapy-Antiplatelet med: Aspirin - dose 81mg  - Risk factor modification - Telemetry monitoring - PT consult, OT consult, Speech consult - Stroke team to follow  Gevena Mart DNP, ACNPC-AG  [facility]uro[facility]ist

## 2022-10-29 NOTE — Progress Notes (Signed)
Physical Therapy Treatment Patient Details Name: THRESSA SHIFFER MRN: [id number] DOB: [birth year] Today's Date: 10/29/2022   History of Present Illness 72 year old female admitted 6/18 after fall, founmd to have left tibial plateau fracture and right metatarsal fracture; CT r/o metatarsal fractures on Rt 6/19. PMHx: alcohol abuse, tobacco abuse, HTN, COPD, mobility issues    PT Comments    Pt with poor tolerance to treatment today. Pt today required +2 Max A to get EOB and was unable to fully stand despite 2 attempts +2 total A with RW. Pt also noted with new extreme kyphosis and pt reports new neck pain and being unable to straighten neck. No change in DC/DME recs at this time. PT will continue to follow.   Recommendations for follow up therapy are one component of a multi-disciplinary discharge planning process, led by the attending physician.  Recommendations may be updated based on patient status, additional functional criteria and insurance authorization.  Follow Up Recommendations  Can patient physically be transported by private vehicle: No    Assistance Recommended at Discharge Frequent or constant Supervision/Assistance  Patient can return home with the following Two people to help with walking and/or transfers;Two people to help with bathing/dressing/bathroom;Assist for transportation;Help with stairs or ramp for entrance   Equipment Recommendations  Other (comment) (Per accepting facility)    Recommendations for Other Services       Precautions / Restrictions Precautions Precautions: Fall Restrictions Weight Bearing Restrictions: Yes RLE Weight Bearing: Weight bearing as tolerated LLE Weight Bearing: Non weight bearing     Mobility  Bed Mobility Overal bed mobility: Needs Assistance Bed Mobility: Supine to Sit, Sit to Supine     Supine to sit: +2 for physical assistance, Max assist Sit to supine: +2 for physical assistance, Max assist   General bed mobility  comments: Assistance with BLE management and trunk elevation. Pt required heavy tactile and verbals for sequencing. Pt noted to be very kyphotic today and states that her neck hurts.    Transfers Overall transfer level: Needs assistance Equipment used: Rolling walker (2 wheels) Transfers: Sit to/from Stand Sit to Stand: +2 physical assistance, Total assist           General transfer comment: Pt able to clear hips twice however not putting forth any effort at all requiring +2 total assist.    Ambulation/Gait               General Gait Details: unable at this time.   Stairs             Wheelchair Mobility    Modified Rankin (Stroke Patients Only)       Balance Overall balance assessment: Needs assistance, History of Falls Sitting-balance support: No upper extremity supported, Feet supported Sitting balance-Leahy Scale: Fair Sitting balance - Comments: Pt noted to be very kyphotic today and report new neck pain. Postural control: Other (comment) (Anterior lean)                                  Cognition Arousal/Alertness: Lethargic, Suspect due to medications Behavior During Therapy: Flat affect Overall Cognitive Status: Difficult to assess                                 General Comments: Pt very slow to respond. Followed one step commands with increased time.  Exercises      General Comments General comments (skin integrity, edema, etc.): BP: 162/82 supine. BP: 177/83 supine after sitting EOB      Pertinent Vitals/Pain Pain Assessment Pain Assessment: Faces Faces Pain Scale: Hurts little more Pain Location: Lt knee Pain Descriptors / Indicators: Aching Pain Intervention(s): Monitored during session    Home Living                          Prior Function            PT Goals (current goals can now be found in the care plan section) Progress towards PT goals: Not progressing toward goals -  comment (Pt requiring more assistance today)    Frequency    Min 3X/week      PT Plan Current plan remains appropriate    Co-evaluation              AM-PAC PT "6 Clicks" Mobility   Outcome Measure  Help needed turning from your back to your side while in a flat bed without using bedrails?: A Lot Help needed moving from lying on your back to sitting on the side of a flat bed without using bedrails?: A Lot Help needed moving to and from a bed to a chair (including a wheelchair)?: A Lot Help needed standing up from a chair using your arms (e.g., wheelchair or bedside chair)?: Total Help needed to walk in hospital room?: Total Help needed climbing 3-5 steps with a railing? : Total 6 Click Score: 9    End of Session Equipment Utilized During Treatment: Gait belt;Left knee immobilizer Activity Tolerance: Patient limited by lethargy;Patient limited by fatigue;Patient limited by pain Patient left: in bed;with call bell/phone within reach;with bed alarm set Nurse Communication: Mobility status;Precautions;Weight bearing status PT Visit Diagnosis: Unsteadiness on feet (R26.81);Repeated falls (R29.6);Muscle weakness (generalized) (M62.81);History of falling (Z91.81);Difficulty in walking, not elsewhere classified (R26.2);Pain Pain - Right/Left: Left Pain - part of body: Knee     Time: 1115-1140 PT Time Calculation (min) (ACUTE ONLY): 25 min  Charges:  $Therapeutic Activity: 23-37 mins                     Shela Nevin, PT, DPT Acute Rehab Services [phone]    Gladys Damme 10/29/2022, 3:40 PM

## 2022-10-29 NOTE — TOC Progression Note (Signed)
Transition of Care [facility]) - Progression Note    Patient Details  Name: Erica Ball MRN: [id number] Date of Birth: 07-Jan-1951  Transition of Care [facility]) CM/SW Contact  Lorri Frederick, LCSW Phone Number: 10/29/2022, 10:55 AM  Clinical Narrative:   Per MD, no DC today.  [facility] notified.  Auth good through tomorrow.     Expected Discharge Plan: Skilled Nursing Facility Barriers to Discharge: Continued Medical Work up, SNF Pending bed offer  Expected Discharge Plan and Services In-house Referral: Clinical Social Work   Post Acute Care Choice: Skilled Nursing Facility Living arrangements for the past 2 months: Single Family Home                                       Social Determinants of Health (SDOH) Interventions SDOH Screenings   Food Insecurity: No Food Insecurity (10/24/2022)  Housing: Low Risk  (10/24/2022)  Transportation Needs: No Transportation Needs (10/24/2022)  Utilities: Not At Risk (10/24/2022)  Tobacco Use: High Risk (10/23/2022)    Readmission Risk Interventions     No data to display

## 2022-10-29 NOTE — Care Management Important Message (Signed)
Important Message  Patient Details  Name: ARETTA STETZEL MRN: [id number] Date of Birth: 11/20/1950   Medicare Important Message Given:  Yes     Sherilyn Banker 10/29/2022, 2:29 PM

## 2022-10-30 ENCOUNTER — Inpatient Hospital Stay (HOSPITAL_COMMUNITY): Payer: Medicare HMO

## 2022-10-30 DIAGNOSIS — I63313 Cerebral infarction due to thrombosis of bilateral middle cerebral arteries: Secondary | ICD-10-CM | POA: Diagnosis not present

## 2022-10-30 DIAGNOSIS — I6389 Other cerebral infarction: Secondary | ICD-10-CM | POA: Diagnosis not present

## 2022-10-30 DIAGNOSIS — S82142A Displaced bicondylar fracture of left tibia, initial encounter for closed fracture: Secondary | ICD-10-CM | POA: Diagnosis not present

## 2022-10-30 LAB — ECHOCARDIOGRAM COMPLETE
Area-P 1/2: 3.77 cm2
Height: 68 in
P 1/2 time: 677 msec
S' Lateral: 2.9 cm
Weight: 2240 oz

## 2022-10-30 LAB — MAGNESIUM: Magnesium: 1.7 mg/dL (ref 1.7–2.4)

## 2022-10-30 LAB — LIPID PANEL
Cholesterol: 118 mg/dL (ref 0–200)
HDL: 47 mg/dL (ref >40)
LDL Cholesterol: 62 mg/dL (ref 0–99)
Total CHOL/HDL Ratio: 2.5 RATIO
Triglycerides: 46 mg/dL (ref <150)
VLDL: 9 mg/dL (ref 0–40)

## 2022-10-30 LAB — BASIC METABOLIC PANEL
Anion gap: 12 (ref 5–15)
BUN: 17 mg/dL (ref 8–23)
CO2: 25 mmol/L (ref 22–32)
Calcium: 8.8 mg/dL — ABNORMAL LOW (ref 8.9–10.3)
Chloride: 96 mmol/L — ABNORMAL LOW (ref 98–111)
Creatinine, Ser: 0.53 mg/dL (ref 0.44–1.00)
GFR, Estimated: >60 mL/min (ref >60)
Glucose, Bld: 126 mg/dL — ABNORMAL HIGH (ref 70–99)
Potassium: 4.2 mmol/L (ref 3.5–5.1)
Sodium: 133 mmol/L — ABNORMAL LOW (ref 135–145)

## 2022-10-30 LAB — BLOOD GAS, ARTERIAL
Acid-Base Excess: 4.7 mmol/L — ABNORMAL HIGH (ref 0.0–2.0)
Bicarbonate: 31.6 mmol/L — ABNORMAL HIGH (ref 20.0–28.0)
Drawn by: 591561
O2 Saturation: 99.1 %
Patient temperature: 36.8
pCO2 arterial: 56 mmHg — ABNORMAL HIGH (ref 32–48)
pH, Arterial: 7.36 (ref 7.35–7.45)
pO2, Arterial: 98 mmHg (ref 83–108)

## 2022-10-30 LAB — GLUCOSE, CAPILLARY
Glucose-Capillary: 104 mg/dL — ABNORMAL HIGH (ref 70–99)
Glucose-Capillary: 114 mg/dL — ABNORMAL HIGH (ref 70–99)
Glucose-Capillary: 131 mg/dL — ABNORMAL HIGH (ref 70–99)
Glucose-Capillary: 132 mg/dL — ABNORMAL HIGH (ref 70–99)
Glucose-Capillary: 91 mg/dL (ref 70–99)

## 2022-10-30 LAB — TROPONIN I (HIGH SENSITIVITY)
Troponin I (High Sensitivity): 45 ng/L — ABNORMAL HIGH (ref <18)
Troponin I (High Sensitivity): 126 ng/L (ref <18)
Troponin I (High Sensitivity): 134 ng/L (ref <18)

## 2022-10-30 LAB — RAPID URINE DRUG SCREEN, HOSP PERFORMED
Amphetamines: NOT DETECTED
Barbiturates: NOT DETECTED
Benzodiazepines: POSITIVE — AB
Cocaine: NOT DETECTED
Opiates: NOT DETECTED
Tetrahydrocannabinol: NOT DETECTED

## 2022-10-30 MED ORDER — METOPROLOL TARTRATE 25 MG PO TABS
25.0000 mg | ORAL_TABLET | Freq: Two times a day (BID) | ORAL | Status: DC
  Filled 2022-10-30: qty 1

## 2022-10-30 MED ORDER — IOHEXOL 350 MG/ML SOLN
75.0000 mL | Freq: Once | INTRAVENOUS | Status: AC | PRN
  Administered 2022-10-30: 75 mL via INTRAVENOUS

## 2022-10-30 MED ORDER — ROSUVASTATIN CALCIUM 5 MG PO TABS
10.0000 mg | ORAL_TABLET | Freq: Every day | ORAL | Status: DC
  Administered 2022-10-30 – 2022-11-06 (×8): 10 mg via ORAL
  Filled 2022-10-30 (×8): qty 2

## 2022-10-30 MED ORDER — MORPHINE SULFATE (PF) 2 MG/ML IV SOLN
1.0000 mg | INTRAVENOUS | Status: DC | PRN
  Administered 2022-11-02 – 2022-11-04 (×3): 1 mg via INTRAVENOUS
  Filled 2022-10-30 (×4): qty 1

## 2022-10-30 MED ORDER — SODIUM CHLORIDE 0.9 % IV BOLUS
500.0000 mL | Freq: Once | INTRAVENOUS | Status: AC
  Administered 2022-10-30: 500 mL via INTRAVENOUS

## 2022-10-30 MED ORDER — METOPROLOL TARTRATE 5 MG/5ML IV SOLN
2.5000 mg | Freq: Once | INTRAVENOUS | Status: AC | PRN
  Administered 2022-10-30: 2.5 mg via INTRAVENOUS

## 2022-10-30 NOTE — Progress Notes (Signed)
More alert this afternoon, oriented to person, place, situation. Unsure of date. HR remains below 100.

## 2022-10-30 NOTE — Progress Notes (Signed)
Occupational Therapy Treatment Patient Details Name: Erica Ball MRN: [id number] DOB: [birth year] Today's Date: 10/30/2022   History of present illness 72 year old female admitted 6/18 after fall, founmd to have left tibial plateau fracture and right metatarsal fracture; CT r/o metatarsal fractures on Rt 6/19. PMHx: alcohol abuse, tobacco abuse, HTN, COPD, mobility issues   OT comments  Pt currently seems to be regressing, she continues to be lethargic and remains confused. Pt exhibiting decreased initiation and processing, needing mutli modal cues to engage in functional tasks. Pt Max A +2 for bed mobility, remains limited by pain as well. OT to continue to progress pt as able, DC plans remain appropriate for SNF   Recommendations for follow up therapy are one component of a multi-disciplinary discharge planning process, led by the attending physician.  Recommendations may be updated based on patient status, additional functional criteria and insurance authorization.    Assistance Recommended at Discharge Frequent or constant Supervision/Assistance  Patient can return home with the following  Two people to help with walking and/or transfers;A lot of help with bathing/dressing/bathroom;Assistance with cooking/housework;Assist for transportation;Help with stairs or ramp for entrance;Direct supervision/assist for financial management;Direct supervision/assist for medications management   Equipment Recommendations  None recommended by OT (defer to next level of care)    Recommendations for Other Services      Precautions / Restrictions Precautions Precautions: Fall Restrictions Weight Bearing Restrictions: Yes RLE Weight Bearing: Weight bearing as tolerated LLE Weight Bearing: Non weight bearing       Mobility Bed Mobility Overal bed mobility: Needs Assistance Bed Mobility: Supine to Sit, Sit to Supine     Supine to sit: Max assist, +2 for physical assistance Sit to supine:  +2 for physical assistance, Max assist   General bed mobility comments: BLE repositioned, LLE elevated on pillows    Transfers                   General transfer comment: defer     Balance Overall balance assessment: Needs assistance, History of Falls Sitting-balance support: No upper extremity supported, Feet supported Sitting balance-Leahy Scale: Fair                                     ADL either performed or assessed with clinical judgement   ADL Overall ADL's : Needs assistance/impaired     Grooming: Sitting;Moderate assistance;Oral care Grooming Details (indicate cue type and reason): Pt needing OT assist for task initiation, verbal and tactile cueing                               General ADL Comments: OOB mobility deferred due to impaired cognition    Extremity/Trunk Assessment              Vision       Perception     Praxis      Cognition Arousal/Alertness: Lethargic Behavior During Therapy: Flat affect Overall Cognitive Status: Difficult to assess                                 General Comments: Pt with decreased command following, slow processing, needing multi modal cues for task initiation.        Exercises      Shoulder Instructions       General  Comments Pt noted to have skin breakdown underneath LLE, pinkish/reddish. Pt repositioned at end of session    Pertinent Vitals/ Pain       Pain Assessment Pain Assessment: Faces Faces Pain Scale: Hurts even more Pain Location: Lt knee Pain Descriptors / Indicators: Aching, Discomfort, Guarding, Grimacing Pain Intervention(s): Repositioned, Monitored during session  Home Living                                          Prior Functioning/Environment              Frequency  Min 2X/week        Progress Toward Goals  OT Goals(current goals can now be found in the care plan section)  Progress towards OT goals:  Not progressing toward goals - comment (Pt seems to be regressing, unsure of cause. May be due to withdrawls)  Acute Rehab OT Goals Patient Stated Goal: To get better OT Goal Formulation: With patient Time For Goal Achievement: 11/07/22 Potential to Achieve Goals: Good ADL Goals Pt Will Perform Grooming: sitting;with min guard assist (min cueing) Pt Will Perform Lower Body Dressing: sitting/lateral leans;with min guard assist (and min cueing for inititation) Pt Will Transfer to Toilet: with mod assist;stand pivot transfer  Plan Discharge plan remains appropriate;Frequency remains appropriate    Co-evaluation                 AM-PAC OT "6 Clicks" Daily Activity     Outcome Measure   Help from another person eating meals?: A Little Help from another person taking care of personal grooming?: A Lot Help from another person toileting, which includes using toliet, bedpan, or urinal?: A Lot Help from another person bathing (including washing, rinsing, drying)?: A Lot Help from another person to put on and taking off regular upper body clothing?: A Little Help from another person to put on and taking off regular lower body clothing?: A Lot 6 Click Score: 14    End of Session    OT Visit Diagnosis: Unsteadiness on feet (R26.81);Other abnormalities of gait and mobility (R26.89);History of falling (Z91.81);Pain Pain - Right/Left: Left Pain - part of body: Knee   Activity Tolerance Patient limited by lethargy   Patient Left in bed;with call bell/phone within reach;with bed alarm set   Nurse Communication Mobility status        Time: 2536-6440 OT Time Calculation (min): 28 min  Charges: OT General Charges $OT Visit: 1 Visit OT Treatments $Self Care/Home Management : 8-22 mins $Therapeutic Activity: 8-22 mins  10/30/2022  AB, OTR/L  Acute Rehabilitation Services  Office: [phone]   Tristan Schroeder 10/30/2022, 3:34 PM

## 2022-10-30 NOTE — Progress Notes (Signed)
   10/30/22 0330  Assess: MEWS Score  Temp 98.5 F (36.9 C)  BP 131/83  MAP (mmHg) 95  Pulse Rate (!) 148  Resp 14  Level of Consciousness Responds to Voice  SpO2 95 %  O2 Device Nasal Cannula  O2 Flow Rate (L/min) 2 L/min  Assess: MEWS Score  MEWS Temp 0  MEWS Systolic 0  MEWS Pulse 3  MEWS RR 0  MEWS LOC 1  MEWS Score 4  MEWS Score Color Red  Assess: if the MEWS score is Yellow or Red  Were vital signs taken at a resting state? Yes  Focused Assessment No change from prior assessment  Does the patient meet 2 or more of the SIRS criteria? No  MEWS guidelines implemented  Yes, red  Treat  MEWS Interventions Considered administering scheduled or prn medications/treatments as ordered  Take Vital Signs  Increase Vital Sign Frequency  Red: Q1hr x2, continue Q4hrs until patient remains green for 12hrs  Escalate  MEWS: Escalate Red: Discuss with charge nurse and notify provider. Consider notifying RRT. If remains red for 2 hours consider need for higher level of care  Provider Notification  Provider Name/Title Chinita Greenland, NP  Date Provider Notified 10/30/22  Time Provider Notified 0330  Method of Notification Page  Notification Reason Change in status  Date of Provider Response 10/30/22  Time of Provider Response 0330  Assess: SIRS CRITERIA  SIRS Temperature  0  SIRS Pulse 1  SIRS Respirations  0  SIRS WBC 0  SIRS Score Sum  1   J. Garner Nash, NP notified of elevated sustained HR 148-154, sinus. Pt asleep. 2.5mg  metoprolol ordered and administered. HR dropped to 101 but increased back up to 128 sustained within a minute. 500 ml NS bolus ordered. No improvement in HR after bolus, sustained 133. EKG ordered: EKG abnormal compared to 6/23. Johann Capers, NP notified.

## 2022-10-30 NOTE — Progress Notes (Signed)
Speech Language Pathology Treatment: Dysphagia  Patient Details Name: Erica Ball MRN: [id number] DOB: 07-Nov-1950 Today's Date: 10/30/2022 Time: 4098-1191 SLP Time Calculation (min) (ACUTE ONLY): 21 min  Assessment / Plan / Recommendation Clinical Impression  Pt's swallow was reassessed. She presents similarly to that which was described during 6/21 evaluation with this clinician, with more frequent oral holding.  She demonstrates persisting coughing with thin liquids, concerning for aspiration. Purees were consumed after max, repetitive cues to attend and swallow. She was verbalizing "I'm awake," smiling at her brother and interactive with therapist, but immediately became sleepy again when not stimulated.   Recommend resuming a dysphagia 1 diet with nectar thick liquids. Crush meds. Hold tray when she is not alert. D/W RN. SLP will follow for safety/diet toleration and cognitive assessment.   HPI HPI: 72 year old female admitted 6/18 after fall, found to have left tibial plateau fracture and right metatarsal fracture; CT r/o metatarsal fractures on Rt 6/19. Patient developed progressively worsening lethargy prior to discharge. MRI brain was obtained on 6/24, which revealed scattered acute/subacute ischemic infarcts bilaterally. PMHx: alcohol abuse, tobacco abuse, HTN, COPD, mobility issues      SLP Plan  Continue with current plan of care      Recommendations for follow up therapy are one component of a multi-disciplinary discharge planning process, led by the attending physician.  Recommendations may be updated based on patient status, additional functional criteria and insurance authorization.    Recommendations  Diet recommendations: Dysphagia 1 (puree);Nectar-thick liquid Liquids provided via: Cup;Straw Medication Administration: Crushed with puree Supervision: Staff to assist with self feeding Compensations: Minimize environmental distractions                  Oral  care BID   Frequent or constant Supervision/Assistance Dysphagia, unspecified (R13.10)     Continue with current plan of care   Tai Syfert L. Samson Frederic, MA CCC/SLP Clinical Specialist - Acute Care SLP Acute Rehabilitation Services Office number [phone]   Blenda Mounts Laurice  10/30/2022, 12:19 PM

## 2022-10-30 NOTE — Progress Notes (Signed)
Went to do EEG..  Echo was in with patient.  Echo Tech asked me to allow 40 minutes of completion.

## 2022-10-30 NOTE — Progress Notes (Signed)
Patient more lethargic after hydromorphone, mouth breathing. O2 adjusted for sats. HR remains 135-140 consistently. Appears to be ST vs accelerated junctional. VSS. NPO at this time so per Dr Mal Misty, may use IV metoprolol for rate control. Will continue to monitor.

## 2022-10-30 NOTE — Progress Notes (Addendum)
PROGRESS NOTE    Erica Ball  WUJ:[id number] DOB: Mar 09, 1951 DOA: 10/23/2022 PCP: Norm Salt, PA   Brief Narrative: Erica Ball is a 72 y.o. female with a history of alcohol abuse, tobacco abuse, hypertension, COPD.  Patient presented secondary to a fall and subsequent left leg pain and found to have a left tibial plateau fracture.  Orthopedic surgery was consulted and recommended nonoperative management with application of left knee immobilizer and nonweightbearing to the right lower extremity.  Patient evaluated by PT/OT with recommendation for SNF.  Patient developed progressively worsening lethargy prior to discharge. MRI on 6/24 revealed evidence of acute/subacute stroke.   Assessment/Plan:  Left tibial plateau fracture Secondary to fall.  Orthopedic surgery consulted on admission and recommend left the knee immobilizer and nonweightbearing on left leg.  Recommendation to follow-up with Dr. Odis Hollingshead in 2 weeks.  PT and OT recommend skilled nursing facility placement on discharge.  Left ankle fracture Noted on CT imaging and likely related to recent fall. -Orthopedic surgery recommendations: NWB left leg  Hyponatremia Potassium was 127 on admission.  Patient with a low to 125 with some improvement with IV fluids.  Urine studies suggest possible dehydration.  Sodium improved to 128.  Lethargy Initially thought related to Librium. Progressively worsened and now persistent. Librium last given on 6/20. CT head unremarkable. No fevers. TSH normal. No hypoglycemia. Vitamin B12 normal. CRP and sed rate mildly elevated. This seems likely to be metabolic in etiology, but unsure of exact etiology. Patient more lethargic today but per nursing, she was more interactive this morning prior to receiving a dose of dilaudid IV for pain. 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 Neurology consulted. LDL of 62. -Neurology recommendations: Aspirin, CTA head and neck -PT/OT/SLP evaluation -NPO until passes SLP evaluation -Transthoracic Echocardiogram  Tachycardia Sinus with evidence of J-point depression. No chest pain. Tachycardia possibly rebound from cessation of metoprolol PO. -Check troponin with delta -Continue telemetry -Metoprolol  Hypomagnesemia Magnesium supplementation given.  Resolved.  Alcohol abuse Patient started on CIWA protocol.  Patient also started on Librium taper.  Local patient is showing signs of significant lethargy concerning for possible overdosing on Librium. -Hold Librium -Continue CIWA protocol -Continue multivitamin, thiamine, folic acid as able  Severe asymptomatic hypertension Patient with a history of hypertension but no medications listed as outpatient regimen.  Patient managed on hydralazine and metoprolol this admission. -Continue hydralazine and metoprolol PRN -Holding hydralazine and losartan while NPO  COPD Stable. No exacerbation. -Continue DuoNeb as needed  Tobacco use -Continue nicotine patch   DVT prophylaxis: Lovenox Code Status:   Code Status: Full Code Family Communication: None at bedside Disposition Plan: Discharge to SNF once bed is available and pending improvement of lethargy   Consultants:  Orthopedic surgery Neurology  Procedures:  None  Antimicrobials: None    Subjective: Patient without specific concerns but significantly lethargic.  Objective: BP 121/81 (BP Location: Right Arm)   Pulse (!) 140   Temp 98.2 F (36.8 C) (Oral)   Resp 18   Ht 5\' 8"  (1.727 m)   Wt 63.5 kg   SpO2 99%   BMI 21.29 kg/m    Examination:  General exam: Appears calm and comfortable  Respiratory system: Clear to auscultation. Respiratory effort normal. Cardiovascular system: S1 & S2 heard, RRR. Gastrointestinal system: Abdomen is nondistended, soft and nontender. Normal bowel sounds heard. Central nervous system: Lethargic but answers questions. Musculoskeletal: No calf tenderness Skin: No cyanosis. No rashes   Data Reviewed: I have personally reviewed following labs and imaging studies   Last CBC Lab Results  Component Value Date   WBC 6.9 10/29/2022   HGB 13.3 10/29/2022   HCT 38.9 10/29/2022   MCV 98.7 10/29/2022   MCH 33.8 10/29/2022   RDW 12.1 10/29/2022   PLT 322 10/29/2022     Last metabolic panel Lab Results  Component Value Date   GLUCOSE 126 (H) 10/30/2022   NA 133 (L) 10/30/2022   K 4.2 10/30/2022   CL 96 (L) 10/30/2022   CO2 25 10/30/2022   BUN 17 10/30/2022   CREATININE 0.53 10/30/2022   GFRNONAA >60 10/30/2022   CALCIUM 8.8 (L) 10/30/2022   PHOS 2.9 10/24/2022   PROT 6.1 (L) 10/24/2022   ALBUMIN 3.4 (L) 10/24/2022   BILITOT 1.2 10/24/2022   ALKPHOS 66 10/24/2022   AST 14 (L) 10/24/2022   ALT 10 10/24/2022   ANIONGAP 12 10/30/2022     Creatinine Clearance: Estimated Creatinine Clearance: 62.8 mL/min (by C-G formula based on SCr of 0.53 mg/dL).  No results found for this or any previous visit (from the past 240 hour(s)).    Radiology Studies: MR BRAIN WO CONTRAST  Result Date: 10/29/2022 CLINICAL DATA:  Mental status change, unknown cause. EXAM: MRI HEAD WITHOUT CONTRAST TECHNIQUE: Multiplanar, multiecho pulse sequences of the brain and surrounding structures were obtained without intravenous contrast. COMPARISON:  CT head without contrast 10/28/2022 FINDINGS: Brain: Scattered small foci of restricted diffusion present the subcortical white matter bilaterally. The largest lesion is in the left corona radiata to the external capsule where a cylindrical area restricted  diffusion is present. The areas in question all demonstrate T2 and FLAIR hyperintensities remote lacunar infarcts are present within the thalami bilaterally. Remote lacunar infarcts are present within the right posterolateral pons extending into the middle cerebellar peduncle. Remote lacunar infarct is present in the left cerebellum. Mild generalized atrophy is present. The ventricles are proportionate to the degree of atrophy. No significant extraaxial fluid collection is present. Scattered foci of susceptibility are present throughout the white matter both above and below the tentorium. Vascular: Flow is present in the major intracranial arteries. Skull and upper cervical spine: The craniocervical junction is normal. Upper cervical spine is within normal limits. Marrow signal is unremarkable. Sinuses/Orbits: A left mastoid effusion is present. No obstructing nasopharyngeal lesion is present. Bilateral lens replacements are noted. Globes and orbits are otherwise unremarkable. IMPRESSION: 1. Scattered small foci of restricted diffusion present the subcortical white matter bilaterally. The largest lesion is in the left corona radiata to the external capsule where a cylindrical area of restricted diffusion is present. Findings are consistent with acute/subacute nonhemorrhagic infarcts. This suggests a central embolic source or small vessel microangiopathy. 2. Remote lacunar infarcts of the right posterolateral pons extending into the middle cerebellar peduncle. 3. Remote lacunar infarcts of the thalami bilaterally. 4. Remote lacunar infarct of the left cerebellum. 5. Scattered foci of susceptibility throughout the white matter both above and below the tentorium. This is also consistent with microvascular angiopathy, potentially amyloid angiopathy 6. Left mastoid effusion. No obstructing nasopharyngeal lesion is present. Electronically Signed   By: Marin Roberts M.D.   On: 10/29/2022 16:07   CT ANKLE LEFT WO  CONTRAST  Result Date: 10/28/2022 CLINICAL DATA:  Ankle trauma, fracture, xray done (Age >= 5y) EXAM: CT OF THE LEFT ANKLE WITHOUT CONTRAST TECHNIQUE: Multidetector CT imaging of the left ankle was performed according to the standard protocol. Multiplanar CT image reconstructions were also generated. RADIATION DOSE REDUCTION: This exam was performed according to the departmental dose-optimization program which includes automated exposure control, adjustment of the mA and/or kV according to patient size and/or use of iterative reconstruction technique. COMPARISON:  X-ray 10/28/2022 FINDINGS: Bones/Joint/Cartilage Diffuse osseous demineralization. Subacute-appearing healing nondisplaced fracture of the lateral malleolus (series 4, images 37-39). No medial or posterior malleolar fracture. Ankle mortise is congruent without dislocation. Bones of the hindfoot are intact. Joint spaces are relatively well preserved. No erosion. No bone lesion. Ligaments Suboptimally assessed by CT. Muscles and Tendons No acute musculotendinous abnormality by CT. Soft tissues Soft tissue swelling most pronounced at the lateral aspect of the ankle. No organized hematoma. IMPRESSION: 1. Subacute-appearing healing nondisplaced fracture of the lateral malleolus. 2. Soft tissue swelling most pronounced at the lateral aspect of the ankle. Electronically Signed   By: Duanne Guess D.O.   On: 10/28/2022 16:30   CT HEAD WO CONTRAST ( )  Result Date: 10/28/2022 CLINICAL DATA:  Altered level of consciousness EXAM: CT HEAD WITHOUT CONTRAST TECHNIQUE: Contiguous axial images were obtained from the base of the skull through the vertex without intravenous contrast. RADIATION DOSE REDUCTION: This exam was performed according to the departmental dose-optimization program which includes automated exposure control, adjustment of the mA and/or kV according to patient size and/or use of iterative reconstruction technique. COMPARISON:  03/08/2006  FINDINGS: Brain: Scattered hypodensities are seen throughout the periventricular and subcortical white matter and right-sided pons, consistent with age-indeterminate small vessel ischemic changes. No other signs of acute infarct or hemorrhage. Lateral ventricles and midline structures are unremarkable. No acute extra-axial fluid collections. No mass effect. Vascular: No hyperdense vessel or unexpected calcification. Skull: Normal. Negative for fracture or focal lesion. Sinuses/Orbits: No acute finding. Other: None. IMPRESSION: 1. Age-indeterminate small-vessel ischemic changes throughout the periventricular and subcortical white matter and right-sided pons, favor chronic. 2. No acute hemorrhage. Electronically Signed   By: Sharlet Salina M.D.   On: 10/28/2022 14:38   DG Ankle Complete Left  Result Date: 10/28/2022 CLINICAL DATA:  Fall with ankle swelling EXAM: LEFT ANKLE COMPLETE - 3 VIEW COMPARISON:  None Available. FINDINGS: Apparent cortical discontinuity along the lateral and posterior aspect of the distal fibular diaphysis. Diffuse osteopenia. Diffuse soft tissue edema about the ankle. IMPRESSION: Apparent cortical discontinuity along the lateral and posterior aspect of the distal fibular diaphysis, which may represent a nondisplaced fracture. Electronically Signed   By: Agustin Cree M.D.   On: 10/28/2022 13:01      LOS: 6 days    Jacquelin Hawking, MD [facility] 10/30/2022, 10:18 AM   If 7PM-7AM, please contact night-coverage [email or web address]

## 2022-10-30 NOTE — Progress Notes (Signed)
STROKE TEAM PROGRESS NOTE   SUBJECTIVE (INTERVAL HISTORY) No family is at the bedside.  Pt very drowsy sleepy and only can be arouse briefly and not able to cooperative with neuro exam. She was given dilaudid at 3am and 9am. Per RN, before the 9am dilaudid, she was more awake alert, complained of leg pain for which dilaudid was given.    OBJECTIVE Temp:  [98.2 F (36.8 C)-98.8 F (37.1 C)] 98.3 F (36.8 C) (06/25 1100) Pulse Rate:  [85-154] 90 (06/25 1100) Cardiac Rhythm: Other (Comment) (06/25 0738) Resp:  [14-20] 18 (06/25 1100) BP: (112-171)/(57-91) 150/84 (06/25 1100) SpO2:  [92 %-100 %] 100 % (06/25 1117)  Recent Labs  Lab 10/28/22 1835 10/29/22 0002 10/29/22 1608 10/30/22 0012 10/30/22 0630  GLUCAP 99 118* 120* 114* 132*   Recent Labs  Lab 10/24/22 0051 10/24/22 0052 10/25/22 0049 10/26/22 0625 10/27/22 0216 10/28/22 1059 10/29/22 0746 10/30/22 0725  NA  --    < > 125* 127* 128* 129* 130* 133*  K  --    < > 3.9 3.9 3.7 3.8 3.8 4.2  CL  --    < > 87* 89* 92* 92* 94* 96*  CO2  --    < > 30 26 26 27 27 25   GLUCOSE  --    < > 105* 86 104* 104*  106* 127* 126*  BUN  --    < > 11 8 12 9 11 17   CREATININE  --    < > 0.57 0.54 0.60 0.51 0.53 0.53  CALCIUM  --    < > 8.5* 8.5* 8.2* 8.7* 9.0 8.8*  MG 1.4*  --  2.2  --   --   --   --  1.7  PHOS 2.9  --   --   --   --   --   --   --    < > = values in this interval not displayed.   Recent Labs  Lab 10/24/22 0052  AST 14*  ALT 10  ALKPHOS 66  BILITOT 1.2  PROT 6.1*  ALBUMIN 3.4*   Recent Labs  Lab 10/23/22 2027 10/24/22 0051 10/25/22 0049 10/26/22 0625 10/29/22 1325  WBC 11.9* 9.7 8.4 9.4 6.9  NEUTROABS  --  7.7  --   --   --   HGB 14.8 13.6 13.0 13.5 13.3  HCT 41.7 38.7 37.7 39.4 38.9  MCV 97.7 98.2 97.2 98.3 98.7  PLT 312 275 268 272 322   Recent Labs  Lab 10/23/22 2027  CKTOTAL 61   No results for input(s): "LABPROT", "INR" in the last 72 hours. No results for input(s): "COLORURINE",  "LABSPEC", "PHURINE", "GLUCOSEU", "HGBUR", "BILIRUBINUR", "KETONESUR", "PROTEINUR", "UROBILINOGEN", "NITRITE", "LEUKOCYTESUR" in the last 72 hours.  Invalid input(s): "APPERANCEUR"     Component Value Date/Time   CHOL 118 10/30/2022 0058   TRIG 46 10/30/2022 0058   HDL 47 10/30/2022 0058   CHOLHDL 2.5 10/30/2022 0058   VLDL 9 10/30/2022 0058   LDLCALC 62 10/30/2022 0058   Lab Results  Component Value Date   HGBA1C 4.7 (L) 10/24/2022      Component Value Date/Time   LABOPIA NONE DETECTED 10/30/2022 1200   COCAINSCRNUR NONE DETECTED 10/30/2022 1200   LABBENZ POSITIVE (A) 10/30/2022 1200   AMPHETMU NONE DETECTED 10/30/2022 1200   THCU NONE DETECTED 10/30/2022 1200   LABBARB NONE DETECTED 10/30/2022 1200    No results for input(s): "ETH" in the last 168 hours.  I have personally  reviewed the radiological images below and agree with the radiology interpretations.  CT ANGIO HEAD NECK W WO CM  Result Date: 10/30/2022 CLINICAL DATA:  Scattered acute and subacute infarcts on 10/29/2022 MRI, determine embolic source EXAM: CT ANGIOGRAPHY HEAD AND NECK WITH AND WITHOUT CONTRAST TECHNIQUE: Multidetector CT imaging of the head and neck was performed using the standard protocol during bolus administration of intravenous contrast. Multiplanar CT image reconstructions and MIPs were obtained to evaluate the vascular anatomy. Carotid stenosis measurements (when applicable) are obtained utilizing NASCET criteria, using the distal internal carotid diameter as the denominator. RADIATION DOSE REDUCTION: This exam was performed according to the departmental dose-optimization program which includes automated exposure control, adjustment of the mA and/or kV according to patient size and/or use of iterative reconstruction technique. CONTRAST:  75mL OMNIPAQUE IOHEXOL 350 MG/ML SOLN COMPARISON:  No prior CTA available, correlation is made with CT head 10/28/2022 and MRI 10/29/2022 FINDINGS: CT HEAD FINDINGS Paste  that Brain: No evidence of acute hemorrhage, mass, mass effect, or midline shift. No hydrocephalus or extra-axial fluid collection. Redemonstrated scattered hypodense foci in the periventricular and subcortical white matter, some of which correlate with the acute and subacute infarcts seen on the 10/29/2022 MRI, although there is also a background of moderate chronic small vessel ischemic disease. Vascular: No hyperdense vessel. Atherosclerotic calcifications in the intracranial carotid and vertebral arteries. Skull: Negative for fracture or focal lesion. Sinuses/Orbits: Mucosal thickening in the ethmoid air cells and right sphenoid sinus. Status post bilateral lens replacements. Other: The mastoid air cells are well aerated. CTA NECK FINDINGS Aortic arch: Standard branching. Imaged portion shows no evidence of aneurysm or dissection. No significant stenosis of the major arch vessel origins. Aortic atherosclerosis. Right carotid system: No evidence of dissection, occlusion, or hemodynamically significant stenosis (greater than 50%). Atherosclerotic disease at the bifurcation and in the proximal ICA is not hemodynamically significant. Left carotid system: No evidence of dissection, occlusion, or hemodynamically significant stenosis (greater than 50%). Atherosclerotic disease at the bifurcation and in the proximal ICA is not hemodynamically significant. Vertebral arteries: Mild stenosis in the proximal right V1. The vertebral arteries are otherwise patent to the skull base, without significant stenosis, dissection, or occlusion. Skeleton: Negative. Other neck: Emphysema. No focal pulmonary opacity or pleural effusion. Upper chest: No focal pulmonary opacity or pleural effusion. Review of the MIP images confirms the above findings CTA HEAD FINDINGS Anterior circulation: Both internal carotid arteries are patent to the termini, with calcifications but without significant stenosis. A1 segments patent. Normal anterior  communicating artery. Anterior cerebral arteries are patent to their distal aspects without significant stenosis. No M1 stenosis or occlusion. MCA branches perfused to their distal aspects without significant stenosis. Posterior circulation: Vertebral arteries patent to the vertebrobasilar junction, with mild stenosis in the distal right V4, after the PICA takeoff. Left dominant system. Posterior inferior cerebellar arteries patent proximally. Basilar patent to its distal aspect without significant stenosis. Superior cerebellar arteries patent proximally. Patent left P1. Fetal origin of the right PCA from the right posterior communicating artery. PCAs perfused to their distal aspects without significant stenosis. The left posterior communicating artery is also patent. Venous sinuses: Not well opacified due to phase of contrast. Anatomic variants: Fetal origin of the right PCA. Review of the MIP images confirms the above findings IMPRESSION: 1. No intracranial large vessel occlusion. Mild stenosis in the distal right V4. 2. Mild stenosis in the proximal right V1. No other hemodynamically significant stenosis in the neck. 3. Aortic atherosclerosis and emphysema.  Aortic Atherosclerosis (ICD10-I70.0) and Emphysema (ICD10-J43.9). Electronically Signed   By: Wiliam Ke M.D.   On: 10/30/2022 11:41   MR BRAIN WO CONTRAST  Result Date: 10/29/2022 CLINICAL DATA:  Mental status change, unknown cause. EXAM: MRI HEAD WITHOUT CONTRAST TECHNIQUE: Multiplanar, multiecho pulse sequences of the brain and surrounding structures were obtained without intravenous contrast. COMPARISON:  CT head without contrast 10/28/2022 FINDINGS: Brain: Scattered small foci of restricted diffusion present the subcortical white matter bilaterally. The largest lesion is in the left corona radiata to the external capsule where a cylindrical area restricted diffusion is present. The areas in question all demonstrate T2 and FLAIR hyperintensities  remote lacunar infarcts are present within the thalami bilaterally. Remote lacunar infarcts are present within the right posterolateral pons extending into the middle cerebellar peduncle. Remote lacunar infarct is present in the left cerebellum. Mild generalized atrophy is present. The ventricles are proportionate to the degree of atrophy. No significant extraaxial fluid collection is present. Scattered foci of susceptibility are present throughout the white matter both above and below the tentorium. Vascular: Flow is present in the major intracranial arteries. Skull and upper cervical spine: The craniocervical junction is normal. Upper cervical spine is within normal limits. Marrow signal is unremarkable. Sinuses/Orbits: A left mastoid effusion is present. No obstructing nasopharyngeal lesion is present. Bilateral lens replacements are noted. Globes and orbits are otherwise unremarkable. IMPRESSION: 1. Scattered small foci of restricted diffusion present the subcortical white matter bilaterally. The largest lesion is in the left corona radiata to the external capsule where a cylindrical area of restricted diffusion is present. Findings are consistent with acute/subacute nonhemorrhagic infarcts. This suggests a central embolic source or small vessel microangiopathy. 2. Remote lacunar infarcts of the right posterolateral pons extending into the middle cerebellar peduncle. 3. Remote lacunar infarcts of the thalami bilaterally. 4. Remote lacunar infarct of the left cerebellum. 5. Scattered foci of susceptibility throughout the white matter both above and below the tentorium. This is also consistent with microvascular angiopathy, potentially amyloid angiopathy 6. Left mastoid effusion. No obstructing nasopharyngeal lesion is present. Electronically Signed   By: Marin Roberts M.D.   On: 10/29/2022 16:07   CT ANKLE LEFT WO CONTRAST  Result Date: 10/28/2022 CLINICAL DATA:  Ankle trauma, fracture, xray done (Age  >= 5y) EXAM: CT OF THE LEFT ANKLE WITHOUT CONTRAST TECHNIQUE: Multidetector CT imaging of the left ankle was performed according to the standard protocol. Multiplanar CT image reconstructions were also generated. RADIATION DOSE REDUCTION: This exam was performed according to the departmental dose-optimization program which includes automated exposure control, adjustment of the mA and/or kV according to patient size and/or use of iterative reconstruction technique. COMPARISON:  X-ray 10/28/2022 FINDINGS: Bones/Joint/Cartilage Diffuse osseous demineralization. Subacute-appearing healing nondisplaced fracture of the lateral malleolus (series 4, images 37-39). No medial or posterior malleolar fracture. Ankle mortise is congruent without dislocation. Bones of the hindfoot are intact. Joint spaces are relatively well preserved. No erosion. No bone lesion. Ligaments Suboptimally assessed by CT. Muscles and Tendons No acute musculotendinous abnormality by CT. Soft tissues Soft tissue swelling most pronounced at the lateral aspect of the ankle. No organized hematoma. IMPRESSION: 1. Subacute-appearing healing nondisplaced fracture of the lateral malleolus. 2. Soft tissue swelling most pronounced at the lateral aspect of the ankle. Electronically Signed   By: Duanne Guess D.O.   On: 10/28/2022 16:30   CT HEAD WO CONTRAST ( )  Result Date: 10/28/2022 CLINICAL DATA:  Altered level of consciousness EXAM: CT HEAD WITHOUT CONTRAST TECHNIQUE: Contiguous  axial images were obtained from the base of the skull through the vertex without intravenous contrast. RADIATION DOSE REDUCTION: This exam was performed according to the departmental dose-optimization program which includes automated exposure control, adjustment of the mA and/or kV according to patient size and/or use of iterative reconstruction technique. COMPARISON:  03/08/2006 FINDINGS: Brain: Scattered hypodensities are seen throughout the periventricular and  subcortical white matter and right-sided pons, consistent with age-indeterminate small vessel ischemic changes. No other signs of acute infarct or hemorrhage. Lateral ventricles and midline structures are unremarkable. No acute extra-axial fluid collections. No mass effect. Vascular: No hyperdense vessel or unexpected calcification. Skull: Normal. Negative for fracture or focal lesion. Sinuses/Orbits: No acute finding. Other: None. IMPRESSION: 1. Age-indeterminate small-vessel ischemic changes throughout the periventricular and subcortical white matter and right-sided pons, favor chronic. 2. No acute hemorrhage. Electronically Signed   By: Sharlet Salina M.D.   On: 10/28/2022 14:38   DG Ankle Complete Left  Result Date: 10/28/2022 CLINICAL DATA:  Fall with ankle swelling EXAM: LEFT ANKLE COMPLETE - 3 VIEW COMPARISON:  None Available. FINDINGS: Apparent cortical discontinuity along the lateral and posterior aspect of the distal fibular diaphysis. Diffuse osteopenia. Diffuse soft tissue edema about the ankle. IMPRESSION: Apparent cortical discontinuity along the lateral and posterior aspect of the distal fibular diaphysis, which may represent a nondisplaced fracture. Electronically Signed   By: Agustin Cree M.D.   On: 10/28/2022 13:01   CT Foot Right Wo Contrast  Result Date: 10/23/2022 CLINICAL DATA:  Fall and right foot pain. EXAM: CT OF THE RIGHT FOOT WITHOUT CONTRAST TECHNIQUE: Multidetector CT imaging of the right foot was performed according to the standard protocol. Multiplanar CT image reconstructions were also generated. RADIATION DOSE REDUCTION: This exam was performed according to the departmental dose-optimization program which includes automated exposure control, adjustment of the mA and/or kV according to patient size and/or use of iterative reconstruction technique. COMPARISON:  Right foot radiograph dated 10/23/2022. FINDINGS: Bones/Joint/Cartilage Evaluation of the bones is very limited due to  advanced osteoporosis. No acute fracture or dislocation. MRI or bone scan may provide better evaluation if there is high clinical concern for acute fracture. Prior internal fixation of the lateral and medial malleoli. Ligaments Suboptimally assessed by CT. Muscles and Tendons No acute findings. Soft tissues There is mild subcutaneous edema of the dorsum of the foot. IMPRESSION: 1. No acute fracture or dislocation. 2. Advanced osteoporosis. Electronically Signed   By: Elgie Collard M.D.   On: 10/23/2022 22:23   CT Knee Left Wo Contrast  Result Date: 10/23/2022 CLINICAL DATA:  Tibial plateau fracture EXAM: CT OF THE LEFT KNEE WITHOUT CONTRAST TECHNIQUE: Multidetector CT imaging of the left knee was performed according to the standard protocol. Multiplanar CT image reconstructions were also generated. RADIATION DOSE REDUCTION: This exam was performed according to the departmental dose-optimization program which includes automated exposure control, adjustment of the mA and/or kV according to patient size and/or use of iterative reconstruction technique. COMPARISON:  None Available. FINDINGS: Bones/Joint/Cartilage The bones are diffusely osteopenic. There is an acute minimally depressed (3 mm) medial tibial plateau fracture. Acute fracture involves the posterior aspect of the tibial spines. This is nondisplaced. There is minimal medial and lateral compartment joint space narrowing with chondrocalcinosis. Large knee joint effusion is present with fat fluid levels. Ligaments Suboptimally assessed by CT. Muscles and Tendons Grossly intact, but well evaluated on noncontrast CT. Soft tissues There is mild subcutaneous edema surrounding the knee. Peripheral vascular calcifications are present. IMPRESSION: 1. Acute  minimally depressed medial tibial plateau fracture. 2. Acute nondisplaced fracture of the posterior aspect of the tibial spines. 3. Large lipohemarthrosis. Electronically Signed   By: Darliss Cheney M.D.   On:  10/23/2022 22:22   DG Foot Complete Left  Result Date: 10/23/2022 CLINICAL DATA:  Fall with foot pain EXAM: LEFT FOOT - COMPLETE 3+ VIEW COMPARISON:  None Available. FINDINGS: Osteopenia limits fracture assessment. Hallux valgus deformity at the first MTP joint with slight lateral subluxation base of first proximal phalanx. Mild degenerative change at the first MTP joint. Dorsal soft tissue swelling. IMPRESSION: Osteopenia limits fracture assessment. No definite acute osseous abnormality. Dorsal soft tissue swelling. Electronically Signed   By: Jasmine Pang M.D.   On: 10/23/2022 19:05   DG Foot Complete Right  Result Date: 10/23/2022 CLINICAL DATA:  Fall with pain EXAM: RIGHT FOOT COMPLETE - 3+ VIEW COMPARISON:  None Available. FINDINGS: Bones appear osteopenic which limits fracture assessment. Surgical plate and fixating screws in the distal fibula with fixating screws at the distal tibia. No malalignment. Acute appearing fractures involving the necks of the right third and fourth metatarsals. IMPRESSION: Acute appearing fractures involving the necks of the right third and fourth metatarsals. Electronically Signed   By: Jasmine Pang M.D.   On: 10/23/2022 19:02   DG Knee Complete 4 Views Left  Result Date: 10/23/2022 CLINICAL DATA:  Fall with knee pain EXAM: LEFT KNEE - COMPLETE 4+ VIEW COMPARISON:  None Available. FINDINGS: Bones appear osteopenic. No malalignment. Moderate knee effusion. Acute nondisplaced intra-articular fracture involving the proximal tibia with lucency to the medial tibial plateau. IMPRESSION: 1. Acute nondisplaced intra-articular fracture involving the proximal tibia with lucency extending to the medial tibial plateau. 2. Moderate knee effusion. Electronically Signed   By: Jasmine Pang M.D.   On: 10/23/2022 19:01     PHYSICAL EXAM  Temp:  [98.2 F (36.8 C)-98.8 F (37.1 C)] 98.3 F (36.8 C) (06/25 1100) Pulse Rate:  [85-154] 90 (06/25 1100) Resp:  [14-20] 18 (06/25  1100) BP: (112-171)/(57-91) 150/84 (06/25 1100) SpO2:  [92 %-100 %] 100 % (06/25 1117)  General - Well nourished, well developed, in no apparent distress, drowsy sleepy.  Ophthalmologic - fundi not visualized due to noncooperation.  Cardiovascular - Regular rhythm and rate.  Neuro - drowsy and sleep and hard to arouse to do neuro exam, eyes briefly opento pain stimulation, moaning to pain but intangible, non verbal and not following commands. With forced eye opening, eye midline, not tracking, not blinking to visual threat. No facial asymmetry. Tongue protrusion not cooperative. Bilateral UEs slow drift when lifting up in the air. Bilaterally LEs mild withdraw to pain, seems left stronger than right. Sensation, coordination and gait not tested.   ASSESSMENT/PLAN [name] is a 72 y.o. female with history of alcohol abuse, smoker, hypertension, COPD admitted for fall at home likely due to alcohol toxicity.  Treated with CIWA protocol and Librium taper.  Also found to have left tibial plateau fracture.  Persistent lethargy, status post MRI showed subacute infarct.  No tPA given due to outside window.    Encephalopathy  Hyponatremia  Alcohol withdraw Drowsy sleepy post dilaudid Na 128->130 Was on librium taper for alcohol withdraw CIWA protocol On MVI/FA/B1 EEG pending  Subacute stroke, incidental finding:  4-5 bilateral small subcortical infarcts, L>R, most likely secondary to small vessel disease source CT head chronic right pontine, b/l periventricular WM and subcortical infarcts. MRI small scattered subacute lacunar infarct bilaterally CTA head and neck  no intracranial LVO, mild stenosis right V1, no other hemodynamically significant stenosis in the neck 2D Echo pending LDL 62 HgbA1c 4.7 UDS positive for benzo Lovenox for VTE prophylaxis No antithrombotic prior to admission, now on aspirin 81 mg daily. No DAPT given stroke is subacute, probably more than 3 weeks now  based on MRI Patient will be counseled to be compliant with her antithrombotic medications Ongoing aggressive stroke risk factor management Therapy recommendations:  SNF Disposition: Pending  Hypertension Stable now On home hydralazine, Cozaar Long term BP goal normotensive  Hyperlipidemia Home meds: None LDL 62, goal < 70 Now on crestor 10, no high intensity statin due to LDL at goal Continue statin at discharge  Tobacco abuse Current smoker Smoking cessation counseling will be provided Nicotine patch provided  Other Stroke Risk Factors   Other Active Problems COPD Fall with left tibial plateau fracture  Hospital day # 6    Marvel Plan, MD PhD Stroke Neurology 10/30/2022 1:22 PM    To contact Stroke Continuity provider, please refer to [email or web address]. After hours, contact General Neurology

## 2022-10-30 NOTE — Plan of Care (Signed)
  Problem: Coping: Goal: Ability to adjust to condition or change in health will improve Outcome: Progressing   Problem: Fluid Volume: Goal: Ability to maintain a balanced intake and output will improve Outcome: Progressing   Problem: Skin Integrity: Goal: Risk for impaired skin integrity will decrease Outcome: Progressing   Problem: Activity: Goal: Risk for activity intolerance will decrease Outcome: Progressing   Problem: Coping: Goal: Level of anxiety will decrease Outcome: Progressing   Problem: Elimination: Goal: Will not experience complications related to bowel motility Outcome: Progressing Goal: Will not experience complications related to urinary retention Outcome: Progressing   Problem: Skin Integrity: Goal: Risk for impaired skin integrity will decrease Outcome: Progressing   Problem: Education: Goal: Knowledge of disease or condition will improve Outcome: Progressing   Problem: Ischemic Stroke/TIA Tissue Perfusion: Goal: Complications of ischemic stroke/TIA will be minimized Outcome: Progressing   Problem: Nutrition: Goal: Risk of aspiration will decrease Outcome: Progressing   Problem: Nutrition: Goal: Dietary intake will improve Outcome: Progressing   Problem: Self-Care: Goal: Ability to communicate needs accurately will improve Outcome: Progressing

## 2022-10-31 ENCOUNTER — Inpatient Hospital Stay (HOSPITAL_COMMUNITY): Payer: Medicare HMO

## 2022-10-31 DIAGNOSIS — S82142A Displaced bicondylar fracture of left tibia, initial encounter for closed fracture: Secondary | ICD-10-CM | POA: Diagnosis not present

## 2022-10-31 DIAGNOSIS — R4182 Altered mental status, unspecified: Secondary | ICD-10-CM | POA: Diagnosis not present

## 2022-10-31 DIAGNOSIS — R569 Unspecified convulsions: Secondary | ICD-10-CM

## 2022-10-31 DIAGNOSIS — I639 Cerebral infarction, unspecified: Secondary | ICD-10-CM

## 2022-10-31 DIAGNOSIS — F1011 Alcohol abuse, in remission: Secondary | ICD-10-CM | POA: Diagnosis not present

## 2022-10-31 DIAGNOSIS — G9341 Metabolic encephalopathy: Secondary | ICD-10-CM

## 2022-10-31 DIAGNOSIS — E440 Moderate protein-calorie malnutrition: Secondary | ICD-10-CM | POA: Insufficient documentation

## 2022-10-31 LAB — BASIC METABOLIC PANEL
Anion gap: 11 (ref 5–15)
BUN: 12 mg/dL (ref 8–23)
CO2: 27 mmol/L (ref 22–32)
Calcium: 8.8 mg/dL — ABNORMAL LOW (ref 8.9–10.3)
Chloride: 96 mmol/L — ABNORMAL LOW (ref 98–111)
Creatinine, Ser: 0.38 mg/dL — ABNORMAL LOW (ref 0.44–1.00)
GFR, Estimated: >60 mL/min (ref >60)
Glucose, Bld: 116 mg/dL — ABNORMAL HIGH (ref 70–99)
Potassium: 3.8 mmol/L (ref 3.5–5.1)
Sodium: 134 mmol/L — ABNORMAL LOW (ref 135–145)

## 2022-10-31 LAB — CBC
HCT: 37.7 % (ref 36.0–46.0)
Hemoglobin: 12.7 g/dL (ref 12.0–15.0)
MCH: 33.5 pg (ref 26.0–34.0)
MCHC: 33.7 g/dL (ref 30.0–36.0)
MCV: 99.5 fL (ref 80.0–100.0)
Platelets: 351 10*3/uL (ref 150–400)
RBC: 3.79 MIL/uL — ABNORMAL LOW (ref 3.87–5.11)
RDW: 12.3 % (ref 11.5–15.5)
WBC: 8.1 10*3/uL (ref 4.0–10.5)
nRBC: 0 % (ref 0.0–0.2)

## 2022-10-31 LAB — GLUCOSE, CAPILLARY
Glucose-Capillary: 99 mg/dL (ref 70–99)
Glucose-Capillary: 103 mg/dL — ABNORMAL HIGH (ref 70–99)
Glucose-Capillary: 93 mg/dL (ref 70–99)
Glucose-Capillary: 151 mg/dL — ABNORMAL HIGH (ref 70–99)

## 2022-10-31 LAB — CREATININE, SERUM
Creatinine, Ser: 0.52 mg/dL (ref 0.44–1.00)
GFR, Estimated: >60 mL/min (ref >60)

## 2022-10-31 LAB — MAGNESIUM: Magnesium: 1.7 mg/dL (ref 1.7–2.4)

## 2022-10-31 MED ORDER — ACETAMINOPHEN 500 MG PO TABS
1000.0000 mg | ORAL_TABLET | Freq: Four times a day (QID) | ORAL | Status: AC | PRN
  Administered 2022-10-31 – 2022-11-02 (×2): 1000 mg via ORAL
  Filled 2022-10-31 (×2): qty 2

## 2022-10-31 NOTE — Progress Notes (Addendum)
STROKE TEAM PROGRESS NOTE   SUBJECTIVE (INTERVAL HISTORY) RNs are at the bedside.  Pt awake alert lying in bed, mental status much improved today. She is conversing well, although dysarthric. Moving all extremities except left leg due to fracture.     OBJECTIVE Temp:  [97.6 F (36.4 C)-98.1 F (36.7 C)] 97.6 F (36.4 C) (06/26 1135) Pulse Rate:  [83-96] 90 (06/26 1135) Cardiac Rhythm: Normal sinus rhythm (06/26 0800) Resp:  [13-21] 18 (06/26 1135) BP: (126-177)/(62-97) 162/87 (06/26 1135) SpO2:  [94 %-100 %] 95 % (06/26 1135)  Recent Labs  Lab 10/30/22 1837 10/30/22 2335 10/31/22 0516 10/31/22 1116 10/31/22 1136  GLUCAP 91 104* 99 103* 93   Recent Labs  Lab 10/25/22 0049 10/26/22 0625 10/27/22 0216 10/28/22 1059 10/29/22 0746 10/30/22 0725 10/31/22 0704 10/31/22 0959  NA 125*   < > 128* 129* 130* 133*  --  134*  K 3.9   < > 3.7 3.8 3.8 4.2  --  3.8  CL 87*   < > 92* 92* 94* 96*  --  96*  CO2 30   < > 26 27 27 25   --  27  GLUCOSE 105*   < > 104* 104*  106* 127* 126*  --  116*  BUN 11   < > 12 9 11 17   --  12  CREATININE 0.57   < > 0.60 0.51 0.53 0.53 0.52 0.38*  CALCIUM 8.5*   < > 8.2* 8.7* 9.0 8.8*  --  8.8*  MG 2.2  --   --   --   --  1.7  --  1.7   < > = values in this interval not displayed.   No results for input(s): "AST", "ALT", "ALKPHOS", "BILITOT", "PROT", "ALBUMIN" in the last 168 hours.  Recent Labs  Lab 10/25/22 0049 10/26/22 0625 10/29/22 1325 10/31/22 0959  WBC 8.4 9.4 6.9 8.1  HGB 13.0 13.5 13.3 12.7  HCT 37.7 39.4 38.9 37.7  MCV 97.2 98.3 98.7 99.5  PLT 268 272 322 351   No results for input(s): "CKTOTAL", "CKMB", "CKMBINDEX", "TROPONINI" in the last 168 hours.  No results for input(s): "LABPROT", "INR" in the last 72 hours. No results for input(s): "COLORURINE", "LABSPEC", "PHURINE", "GLUCOSEU", "HGBUR", "BILIRUBINUR", "KETONESUR", "PROTEINUR", "UROBILINOGEN", "NITRITE", "LEUKOCYTESUR" in the last 72 hours.  Invalid input(s):  "APPERANCEUR"     Component Value Date/Time   CHOL 118 10/30/2022 0058   TRIG 46 10/30/2022 0058   HDL 47 10/30/2022 0058   CHOLHDL 2.5 10/30/2022 0058   VLDL 9 10/30/2022 0058   LDLCALC 62 10/30/2022 0058   Lab Results  Component Value Date   HGBA1C 4.7 (L) 10/24/2022      Component Value Date/Time   LABOPIA NONE DETECTED 10/30/2022 1200   COCAINSCRNUR NONE DETECTED 10/30/2022 1200   LABBENZ POSITIVE (A) 10/30/2022 1200   AMPHETMU NONE DETECTED 10/30/2022 1200   THCU NONE DETECTED 10/30/2022 1200   LABBARB NONE DETECTED 10/30/2022 1200    No results for input(s): "ETH" in the last 168 hours.  I have personally reviewed the radiological images below and agree with the radiology interpretations.  EEG adult  Result Date: 10/31/2022 Charlsie Quest, MD     10/31/2022 11:04 AM Patient Name: Erica Ball MRN: [id number] Epilepsy Attending: Charlsie Quest Referring Physician/Provider: Marvel Plan, MD Date: 10/31/2022 Duration: 22.55 mins Patient history: 72yo F with ams getting eeg to evaluate for seizure. Level of alertness: Awake AEDs during EEG study: None Technical aspects:  This EEG study was done with scalp electrodes positioned according to the 10-20 International system of electrode placement. Electrical activity was reviewed with band pass filter of 1-70Hz , sensitivity of 7 uV/mm, display speed of 43mm/sec with a 60Hz  notched filter applied as appropriate. EEG data were recorded continuously and digitally stored.  Video monitoring was available and reviewed as appropriate. Description: No clear posterior dominant rhythm was seen. EEG also showed continuous generalized and lateralized right hemisphere mixed frequencies with predominantly 6 to 9 Hz theta-alpha activity admixed with intermittent generalized 2 to 3 Hz delta slowing.  Physiologic photic driving was not seen during photic stimulation. Hyperventilation was not performed.   ABNORMALITY - Continuous slow, generalized and  lateralized right hemisphere IMPRESSION: This study is suggestive of cortical dysfunction arising from right hemisphere likely secondary to underlying structural abnormality. Additionally there is moderate diffuse encephalopathy. No seizures or epileptiform discharges were seen throughout the recording. Charlsie Quest   ECHOCARDIOGRAM COMPLETE  Result Date: 10/30/2022    ECHOCARDIOGRAM REPORT   Patient Name:   Erica Ball Tsuchiya Date of Exam: 10/30/2022 Medical Rec #:  [id number]         Height:       68.0 in Accession #:    [id number]        Weight:       140.0 lb Date of Birth:  09-26-1950        BSA:          1.756 m Patient Age:    1 years          BP:           150/84 mmHg Patient Gender: F                 HR:           80 bpm. Exam Location:  Inpatient Procedure: 2D Echo, Color Doppler and Cardiac Doppler Indications:    CVA  History:        Patient has prior history of Echocardiogram examinations. COPD;                 Risk Factors:Hypertension and Current Smoker. Sepsis. ETOH                 abuse.  Sonographer:    Aron Baba Referring Phys: [phone] RALPH A NETTEY IMPRESSIONS  1. Left ventricular ejection fraction, by estimation, is 60 to 65%. The left ventricle has normal function. The left ventricle has no regional wall motion abnormalities. Left ventricular diastolic parameters are consistent with Grade I diastolic dysfunction (impaired relaxation).  2. Right ventricular systolic function is normal. The right ventricular size is normal.  3. Left atrial size was mildly dilated.  4. The mitral valve is normal in structure. No evidence of mitral valve regurgitation. No evidence of mitral stenosis.  5. The aortic valve is normal in structure. Aortic valve regurgitation is mild to moderate. No aortic stenosis is present.  6. The inferior vena cava is normal in size with greater than 50% respiratory variability, suggesting right atrial pressure of 3 mmHg. FINDINGS  Left Ventricle: Left ventricular ejection  fraction, by estimation, is 60 to 65%. The left ventricle has normal function. The left ventricle has no regional wall motion abnormalities. The left ventricular internal cavity size was normal in size. There is  no left ventricular hypertrophy. Left ventricular diastolic parameters are consistent with Grade I diastolic dysfunction (impaired relaxation). Right Ventricle: The right ventricular size is normal. No increase  in right ventricular wall thickness. Right ventricular systolic function is normal. Left Atrium: Left atrial size was mildly dilated. Right Atrium: Right atrial size was normal in size. Pericardium: There is no evidence of pericardial effusion. Mitral Valve: The mitral valve is normal in structure. No evidence of mitral valve regurgitation. No evidence of mitral valve stenosis. Tricuspid Valve: The tricuspid valve is normal in structure. Tricuspid valve regurgitation is mild . No evidence of tricuspid stenosis. Aortic Valve: The aortic valve is normal in structure. Aortic valve regurgitation is mild to moderate. Aortic regurgitation PHT measures 677 msec. No aortic stenosis is present. Pulmonic Valve: The pulmonic valve was normal in structure. Pulmonic valve regurgitation is not visualized. No evidence of pulmonic stenosis. Aorta: The aortic root is normal in size and structure. Venous: The inferior vena cava is normal in size with greater than 50% respiratory variability, suggesting right atrial pressure of 3 mmHg. IAS/Shunts: No atrial level shunt detected by color flow Doppler.  LEFT VENTRICLE PLAX 2D LVIDd:         4.40 cm   Diastology LVIDs:         2.90 cm   LV e' medial:    5.33 cm/s LV PW:         0.60 cm   LV E/e' medial:  13.7 LV IVS:        0.90 cm   LV e' lateral:   8.59 cm/s LVOT diam:     1.90 cm   LV E/e' lateral: 8.5 LV SV:         71 LV SV Index:   41 LVOT Area:     2.84 cm  RIGHT VENTRICLE RV S prime:     15.40 cm/s TAPSE (M-mode): 2.4 cm LEFT ATRIUM             Index        RIGHT  ATRIUM           Index LA Vol (A2C):   74.8 ml 42.59 ml/m  RA Area:     12.70 cm LA Vol (A4C):   56.0 ml 31.89 ml/m  RA Volume:   26.00 ml  14.80 ml/m LA Biplane Vol: 70.7 ml 40.26 ml/m  AORTIC VALVE             PULMONIC VALVE LVOT Vmax:   129.00 cm/s PR End Diast Vel: 11.16 msec LVOT Vmean:  83.300 cm/s LVOT VTI:    0.252 m AI PHT:      677 msec  AORTA Ao Root diam: 3.50 cm Ao Asc diam:  3.20 cm MITRAL VALVE                TRICUSPID VALVE MV Area (PHT): 3.77 cm     TR Peak grad:   26.8 mmHg MV Decel Time: 201 msec     TR Vmax:        259.00 cm/s MV E velocity: 73.20 cm/s MV A velocity: 148.00 cm/s  SHUNTS MV E/A ratio:  0.49         Systemic VTI:  0.25 m                             Systemic Diam: 1.90 cm Kardie Tobb DO Electronically signed by Thomasene Ripple DO Signature Date/Time: 10/30/2022/3:20:07 PM    Final    CT ANGIO HEAD NECK W WO CM  Result Date: 10/30/2022 CLINICAL DATA:  Scattered acute and subacute infarcts on  10/29/2022 MRI, determine embolic source EXAM: CT ANGIOGRAPHY HEAD AND NECK WITH AND WITHOUT CONTRAST TECHNIQUE: Multidetector CT imaging of the head and neck was performed using the standard protocol during bolus administration of intravenous contrast. Multiplanar CT image reconstructions and MIPs were obtained to evaluate the vascular anatomy. Carotid stenosis measurements (when applicable) are obtained utilizing NASCET criteria, using the distal internal carotid diameter as the denominator. RADIATION DOSE REDUCTION: This exam was performed according to the departmental dose-optimization program which includes automated exposure control, adjustment of the mA and/or kV according to patient size and/or use of iterative reconstruction technique. CONTRAST:  75mL OMNIPAQUE IOHEXOL 350 MG/ML SOLN COMPARISON:  No prior CTA available, correlation is made with CT head 10/28/2022 and MRI 10/29/2022 FINDINGS: CT HEAD FINDINGS Paste that Brain: No evidence of acute hemorrhage, mass, mass effect, or  midline shift. No hydrocephalus or extra-axial fluid collection. Redemonstrated scattered hypodense foci in the periventricular and subcortical white matter, some of which correlate with the acute and subacute infarcts seen on the 10/29/2022 MRI, although there is also a background of moderate chronic small vessel ischemic disease. Vascular: No hyperdense vessel. Atherosclerotic calcifications in the intracranial carotid and vertebral arteries. Skull: Negative for fracture or focal lesion. Sinuses/Orbits: Mucosal thickening in the ethmoid air cells and right sphenoid sinus. Status post bilateral lens replacements. Other: The mastoid air cells are well aerated. CTA NECK FINDINGS Aortic arch: Standard branching. Imaged portion shows no evidence of aneurysm or dissection. No significant stenosis of the major arch vessel origins. Aortic atherosclerosis. Right carotid system: No evidence of dissection, occlusion, or hemodynamically significant stenosis (greater than 50%). Atherosclerotic disease at the bifurcation and in the proximal ICA is not hemodynamically significant. Left carotid system: No evidence of dissection, occlusion, or hemodynamically significant stenosis (greater than 50%). Atherosclerotic disease at the bifurcation and in the proximal ICA is not hemodynamically significant. Vertebral arteries: Mild stenosis in the proximal right V1. The vertebral arteries are otherwise patent to the skull base, without significant stenosis, dissection, or occlusion. Skeleton: Negative. Other neck: Emphysema. No focal pulmonary opacity or pleural effusion. Upper chest: No focal pulmonary opacity or pleural effusion. Review of the MIP images confirms the above findings CTA HEAD FINDINGS Anterior circulation: Both internal carotid arteries are patent to the termini, with calcifications but without significant stenosis. A1 segments patent. Normal anterior communicating artery. Anterior cerebral arteries are patent to their  distal aspects without significant stenosis. No M1 stenosis or occlusion. MCA branches perfused to their distal aspects without significant stenosis. Posterior circulation: Vertebral arteries patent to the vertebrobasilar junction, with mild stenosis in the distal right V4, after the PICA takeoff. Left dominant system. Posterior inferior cerebellar arteries patent proximally. Basilar patent to its distal aspect without significant stenosis. Superior cerebellar arteries patent proximally. Patent left P1. Fetal origin of the right PCA from the right posterior communicating artery. PCAs perfused to their distal aspects without significant stenosis. The left posterior communicating artery is also patent. Venous sinuses: Not well opacified due to phase of contrast. Anatomic variants: Fetal origin of the right PCA. Review of the MIP images confirms the above findings IMPRESSION: 1. No intracranial large vessel occlusion. Mild stenosis in the distal right V4. 2. Mild stenosis in the proximal right V1. No other hemodynamically significant stenosis in the neck. 3. Aortic atherosclerosis and emphysema. Aortic Atherosclerosis (ICD10-I70.0) and Emphysema (ICD10-J43.9). Electronically Signed   By: Wiliam Ke M.D.   On: 10/30/2022 11:41   MR BRAIN WO CONTRAST  Result Date: 10/29/2022 CLINICAL DATA:  Mental status change, unknown cause. EXAM: MRI HEAD WITHOUT CONTRAST TECHNIQUE: Multiplanar, multiecho pulse sequences of the brain and surrounding structures were obtained without intravenous contrast. COMPARISON:  CT head without contrast 10/28/2022 FINDINGS: Brain: Scattered small foci of restricted diffusion present the subcortical white matter bilaterally. The largest lesion is in the left corona radiata to the external capsule where a cylindrical area restricted diffusion is present. The areas in question all demonstrate T2 and FLAIR hyperintensities remote lacunar infarcts are present within the thalami bilaterally.  Remote lacunar infarcts are present within the right posterolateral pons extending into the middle cerebellar peduncle. Remote lacunar infarct is present in the left cerebellum. Mild generalized atrophy is present. The ventricles are proportionate to the degree of atrophy. No significant extraaxial fluid collection is present. Scattered foci of susceptibility are present throughout the white matter both above and below the tentorium. Vascular: Flow is present in the major intracranial arteries. Skull and upper cervical spine: The craniocervical junction is normal. Upper cervical spine is within normal limits. Marrow signal is unremarkable. Sinuses/Orbits: A left mastoid effusion is present. No obstructing nasopharyngeal lesion is present. Bilateral lens replacements are noted. Globes and orbits are otherwise unremarkable. IMPRESSION: 1. Scattered small foci of restricted diffusion present the subcortical white matter bilaterally. The largest lesion is in the left corona radiata to the external capsule where a cylindrical area of restricted diffusion is present. Findings are consistent with acute/subacute nonhemorrhagic infarcts. This suggests a central embolic source or small vessel microangiopathy. 2. Remote lacunar infarcts of the right posterolateral pons extending into the middle cerebellar peduncle. 3. Remote lacunar infarcts of the thalami bilaterally. 4. Remote lacunar infarct of the left cerebellum. 5. Scattered foci of susceptibility throughout the white matter both above and below the tentorium. This is also consistent with microvascular angiopathy, potentially amyloid angiopathy 6. Left mastoid effusion. No obstructing nasopharyngeal lesion is present. Electronically Signed   By: Marin Roberts M.D.   On: 10/29/2022 16:07   CT ANKLE LEFT WO CONTRAST  Result Date: 10/28/2022 CLINICAL DATA:  Ankle trauma, fracture, xray done (Age >= 5y) EXAM: CT OF THE LEFT ANKLE WITHOUT CONTRAST TECHNIQUE:  Multidetector CT imaging of the left ankle was performed according to the standard protocol. Multiplanar CT image reconstructions were also generated. RADIATION DOSE REDUCTION: This exam was performed according to the departmental dose-optimization program which includes automated exposure control, adjustment of the mA and/or kV according to patient size and/or use of iterative reconstruction technique. COMPARISON:  X-ray 10/28/2022 FINDINGS: Bones/Joint/Cartilage Diffuse osseous demineralization. Subacute-appearing healing nondisplaced fracture of the lateral malleolus (series 4, images 37-39). No medial or posterior malleolar fracture. Ankle mortise is congruent without dislocation. Bones of the hindfoot are intact. Joint spaces are relatively well preserved. No erosion. No bone lesion. Ligaments Suboptimally assessed by CT. Muscles and Tendons No acute musculotendinous abnormality by CT. Soft tissues Soft tissue swelling most pronounced at the lateral aspect of the ankle. No organized hematoma. IMPRESSION: 1. Subacute-appearing healing nondisplaced fracture of the lateral malleolus. 2. Soft tissue swelling most pronounced at the lateral aspect of the ankle. Electronically Signed   By: Duanne Guess D.O.   On: 10/28/2022 16:30   CT HEAD WO CONTRAST ( )  Result Date: 10/28/2022 CLINICAL DATA:  Altered level of consciousness EXAM: CT HEAD WITHOUT CONTRAST TECHNIQUE: Contiguous axial images were obtained from the base of the skull through the vertex without intravenous contrast. RADIATION DOSE REDUCTION: This exam was performed according to the departmental dose-optimization program which includes automated exposure  control, adjustment of the mA and/or kV according to patient size and/or use of iterative reconstruction technique. COMPARISON:  03/08/2006 FINDINGS: Brain: Scattered hypodensities are seen throughout the periventricular and subcortical white matter and right-sided pons, consistent with  age-indeterminate small vessel ischemic changes. No other signs of acute infarct or hemorrhage. Lateral ventricles and midline structures are unremarkable. No acute extra-axial fluid collections. No mass effect. Vascular: No hyperdense vessel or unexpected calcification. Skull: Normal. Negative for fracture or focal lesion. Sinuses/Orbits: No acute finding. Other: None. IMPRESSION: 1. Age-indeterminate small-vessel ischemic changes throughout the periventricular and subcortical white matter and right-sided pons, favor chronic. 2. No acute hemorrhage. Electronically Signed   By: Sharlet Salina M.D.   On: 10/28/2022 14:38   DG Ankle Complete Left  Result Date: 10/28/2022 CLINICAL DATA:  Fall with ankle swelling EXAM: LEFT ANKLE COMPLETE - 3 VIEW COMPARISON:  None Available. FINDINGS: Apparent cortical discontinuity along the lateral and posterior aspect of the distal fibular diaphysis. Diffuse osteopenia. Diffuse soft tissue edema about the ankle. IMPRESSION: Apparent cortical discontinuity along the lateral and posterior aspect of the distal fibular diaphysis, which may represent a nondisplaced fracture. Electronically Signed   By: Agustin Cree M.D.   On: 10/28/2022 13:01   CT Foot Right Wo Contrast  Result Date: 10/23/2022 CLINICAL DATA:  Fall and right foot pain. EXAM: CT OF THE RIGHT FOOT WITHOUT CONTRAST TECHNIQUE: Multidetector CT imaging of the right foot was performed according to the standard protocol. Multiplanar CT image reconstructions were also generated. RADIATION DOSE REDUCTION: This exam was performed according to the departmental dose-optimization program which includes automated exposure control, adjustment of the mA and/or kV according to patient size and/or use of iterative reconstruction technique. COMPARISON:  Right foot radiograph dated 10/23/2022. FINDINGS: Bones/Joint/Cartilage Evaluation of the bones is very limited due to advanced osteoporosis. No acute fracture or dislocation. MRI or  bone scan may provide better evaluation if there is high clinical concern for acute fracture. Prior internal fixation of the lateral and medial malleoli. Ligaments Suboptimally assessed by CT. Muscles and Tendons No acute findings. Soft tissues There is mild subcutaneous edema of the dorsum of the foot. IMPRESSION: 1. No acute fracture or dislocation. 2. Advanced osteoporosis. Electronically Signed   By: Elgie Collard M.D.   On: 10/23/2022 22:23   CT Knee Left Wo Contrast  Result Date: 10/23/2022 CLINICAL DATA:  Tibial plateau fracture EXAM: CT OF THE LEFT KNEE WITHOUT CONTRAST TECHNIQUE: Multidetector CT imaging of the left knee was performed according to the standard protocol. Multiplanar CT image reconstructions were also generated. RADIATION DOSE REDUCTION: This exam was performed according to the departmental dose-optimization program which includes automated exposure control, adjustment of the mA and/or kV according to patient size and/or use of iterative reconstruction technique. COMPARISON:  None Available. FINDINGS: Bones/Joint/Cartilage The bones are diffusely osteopenic. There is an acute minimally depressed (3 mm) medial tibial plateau fracture. Acute fracture involves the posterior aspect of the tibial spines. This is nondisplaced. There is minimal medial and lateral compartment joint space narrowing with chondrocalcinosis. Large knee joint effusion is present with fat fluid levels. Ligaments Suboptimally assessed by CT. Muscles and Tendons Grossly intact, but well evaluated on noncontrast CT. Soft tissues There is mild subcutaneous edema surrounding the knee. Peripheral vascular calcifications are present. IMPRESSION: 1. Acute minimally depressed medial tibial plateau fracture. 2. Acute nondisplaced fracture of the posterior aspect of the tibial spines. 3. Large lipohemarthrosis. Electronically Signed   By: Darliss Cheney M.D.   On:  10/23/2022 22:22   DG Foot Complete Left  Result Date:  10/23/2022 CLINICAL DATA:  Fall with foot pain EXAM: LEFT FOOT - COMPLETE 3+ VIEW COMPARISON:  None Available. FINDINGS: Osteopenia limits fracture assessment. Hallux valgus deformity at the first MTP joint with slight lateral subluxation base of first proximal phalanx. Mild degenerative change at the first MTP joint. Dorsal soft tissue swelling. IMPRESSION: Osteopenia limits fracture assessment. No definite acute osseous abnormality. Dorsal soft tissue swelling. Electronically Signed   By: Jasmine Pang M.D.   On: 10/23/2022 19:05   DG Foot Complete Right  Result Date: 10/23/2022 CLINICAL DATA:  Fall with pain EXAM: RIGHT FOOT COMPLETE - 3+ VIEW COMPARISON:  None Available. FINDINGS: Bones appear osteopenic which limits fracture assessment. Surgical plate and fixating screws in the distal fibula with fixating screws at the distal tibia. No malalignment. Acute appearing fractures involving the necks of the right third and fourth metatarsals. IMPRESSION: Acute appearing fractures involving the necks of the right third and fourth metatarsals. Electronically Signed   By: Jasmine Pang M.D.   On: 10/23/2022 19:02   DG Knee Complete 4 Views Left  Result Date: 10/23/2022 CLINICAL DATA:  Fall with knee pain EXAM: LEFT KNEE - COMPLETE 4+ VIEW COMPARISON:  None Available. FINDINGS: Bones appear osteopenic. No malalignment. Moderate knee effusion. Acute nondisplaced intra-articular fracture involving the proximal tibia with lucency to the medial tibial plateau. IMPRESSION: 1. Acute nondisplaced intra-articular fracture involving the proximal tibia with lucency extending to the medial tibial plateau. 2. Moderate knee effusion. Electronically Signed   By: Jasmine Pang M.D.   On: 10/23/2022 19:01     PHYSICAL EXAM  Temp:  [97.6 F (36.4 C)-98.1 F (36.7 C)] 97.6 F (36.4 C) (06/26 1135) Pulse Rate:  [83-96] 90 (06/26 1135) Resp:  [13-21] 18 (06/26 1135) BP: (126-177)/(62-97) 162/87 (06/26 1135) SpO2:  [94  %-100 %] 95 % (06/26 1135)  General - Well nourished, well developed, in no apparent distress, drowsy sleepy.  Ophthalmologic - fundi not visualized due to noncooperation.  Cardiovascular - Regular rhythm and rate.  Neuro - drowsy and sleep and hard to arouse to do neuro exam, eyes briefly opento pain stimulation, moaning to pain but intangible, non verbal and not following commands. With forced eye opening, eye midline, not tracking, not blinking to visual threat. No facial asymmetry. Tongue protrusion not cooperative. Bilateral UEs slow drift when lifting up in the air. Bilaterally LEs mild withdraw to pain, seems left stronger than right. Sensation, coordination and gait not tested. Difficulty with neck flexion, likely chronic with cervical spondylosis    ASSESSMENT/PLAN Ms. ELDRED LIEVANOS is a 72 y.o. female with history of alcohol abuse, smoker, hypertension, COPD admitted for fall at home likely due to alcohol toxicity.  Treated with CIWA protocol and Librium taper.  Also found to have left tibial plateau fracture.  Persistent lethargy, status post MRI showed subacute infarct.  No tPA given due to outside window.    Encephalopathy  Hyponatremia  Alcohol withdraw Drowsy sleepy post dilaudid Na 128->130 Was on librium taper for alcohol withdraw CIWA protocol On MVI/FA/B1 EEG right hemisphere slow, no seizure  Subacute stroke, incidental finding:  4-5 bilateral small subcortical infarcts, L>R, most likely secondary to small vessel disease source CT head chronic right pontine, b/l periventricular WM and subcortical infarcts. MRI small scattered subacute lacunar infarct bilaterally CTA head and neck no intracranial LVO, mild stenosis right V1, no other hemodynamically significant stenosis in the neck 2D Echo EF 60-65%  LDL 62 HgbA1c 4.7 UDS positive for benzo Lovenox for VTE prophylaxis No antithrombotic prior to admission, now on aspirin 81 mg daily. No DAPT given stroke is  subacute, probably more than 3 weeks now based on MRI Patient will be counseled to be compliant with her antithrombotic medications Ongoing aggressive stroke risk factor management Therapy recommendations:  SNF Disposition: Pending  Hypertension Stable now On home hydralazine, Cozaar Long term BP goal normotensive  Hyperlipidemia Home meds: None LDL 62, goal < 70 Now on crestor 10, no high intensity statin due to LDL at goal Continue statin at discharge  Tobacco abuse Current smoker Smoking cessation counseling will be provided Nicotine patch provided  Other Stroke Risk Factors   Other Active Problems COPD Fall with left tibial plateau fracture  Hospital day # 7  Neurology will sign off. Please call with questions. Pt will follow up with stroke clinic NP at [facility] in about 4 weeks. Thanks for the consult.   Marvel Plan, MD PhD Stroke Neurology 10/31/2022 1:13 PM    To contact Stroke Continuity provider, please refer to [email or web address]. After hours, contact General Neurology

## 2022-10-31 NOTE — Procedures (Addendum)
Patient Name: Erica Ball  MRN: [id number]  Epilepsy Attending: Charlsie Quest  Referring Physician/Provider: Marvel Plan, MD  Date: 10/31/2022 Duration: 22.55 mins  Patient history: 72yo F with ams getting eeg to evaluate for seizure.  Level of alertness: Awake  AEDs during EEG study: None  Technical aspects: This EEG study was done with scalp electrodes positioned according to the 10-20 International system of electrode placement. Electrical activity was reviewed with band pass filter of 1-70Hz , sensitivity of 7 uV/mm, display speed of 1mm/sec with a 60Hz  notched filter applied as appropriate. EEG data were recorded continuously and digitally stored.  Video monitoring was available and reviewed as appropriate.  Description: No clear posterior dominant rhythm was seen. EEG also showed continuous generalized and lateralized right hemisphere mixed frequencies with predominantly 6 to 9 Hz theta-alpha activity admixed with intermittent generalized 2 to 3 Hz delta slowing.  Physiologic photic driving was not seen during photic stimulation. Hyperventilation was not performed.     ABNORMALITY - Continuous slow, generalized and lateralized right hemisphere  IMPRESSION: This study is suggestive of cortical dysfunction arising from right hemisphere likely secondary to underlying structural abnormality. Additionally there is moderate diffuse encephalopathy. No seizures or epileptiform discharges were seen throughout the recording.  Vali Capano Annabelle Harman

## 2022-10-31 NOTE — Progress Notes (Signed)
EEG complete - results pending 

## 2022-10-31 NOTE — Progress Notes (Signed)
Nutrition Follow-up  DOCUMENTATION CODES:   Non-severe (moderate) malnutrition in context of chronic illness  INTERVENTION:  Encourage adequate PO intake Magic cup TID with meals, each supplement provides 290 kcal and 9 grams of protein Mighty Shake TID with meals, each supplement provides 330 kcals and 9 grams of protein MVI with minerals daily  NUTRITION DIAGNOSIS:   Moderate Malnutrition related to chronic illness (EtOH use, COPD) as evidenced by mild fat depletion, severe muscle depletion, moderate muscle depletion. - diagnosis updated 6/26  GOAL:   Patient will meet greater than or equal to 90% of their needs - goal unmet  MONITOR:   PO intake, Supplement acceptance, Labs, Weight trends  REASON FOR ASSESSMENT:   Consult, Malnutrition Screening Tool Assessment of nutrition requirement/status  ASSESSMENT:   Erica Ball fell while attempting to sit down, leading to L tibial plateau fracture and R metatarsal fractures. PMH significant for EtOH use, tobacco use, HTN, COPD, mobility issues.  6/21 - s/p SLP evaluation- recommend dysphagia 1, nectar thick liquids 6/24 - MRI findings of acute/subacute stroke  SLP continues to follow. Erica Ball with ongoing oral holding, persistent coughing with thin liquids.    Erica Ball sitting up in bed at time of visit. She expressed frustrations over ongoing admission d/t "my head." She was feeling hungry at time of visit as she recalled not having eaten breakfast by that time.   Erica Ball states that she was eating well PTA but recalled only eating 1 meal per day on average. Unable to obtain further details from Erica Ball.  Meal completions: 6/22: 10% breakfast 6/23: 20% breakfast, 50% lunch 6/26: 75% lunch  Per review of chart, Erica Ball has been experiencing lethargy the past few days likely r/t acute/subacute stroke. Unfortunately, there is limited documentation of meal completions to review however suspect po intake to have been limited d/t noted lethargy. Will continue  monitoring po intake as Erica Ball currently appears more alert.   No updated weight history on file to review.   Medications: folvite, MVI, thiamine  Labs: sodium 134, Cr 0.38, CBG's 93-131 x24 hours  NUTRITION - FOCUSED PHYSICAL EXAM:  Flowsheet Row Most Recent Value  Orbital Region No depletion  Upper Arm Region Severe depletion  Thoracic and Lumbar Region Mild depletion  Buccal Region No depletion  Temple Region Moderate depletion  Clavicle Bone Region Moderate depletion  Clavicle and Acromion Bone Region Moderate depletion  Scapular Bone Region Moderate depletion  Dorsal Hand Severe depletion  Patellar Region Severe depletion  Anterior Thigh Region Severe depletion  Posterior Calf Region Severe depletion  Edema (RD Assessment) None  Hair Reviewed  Eyes Reviewed  Mouth Reviewed  Skin Reviewed  Nails Reviewed       Diet Order:   Diet Order             DIET - DYS 1 Room service appropriate? Yes with Assist; Fluid consistency: Nectar Thick  Diet effective now                   EDUCATION NEEDS:   Education needs have been addressed  Skin:  Skin Assessment: Reviewed RN Assessment  Last BM:  6/25 (type 6 smear)  Height:   Ht Readings from Last 1 Encounters:  10/24/22 5\' 8"  (1.727 m)    Weight:   Wt Readings from Last 1 Encounters:  10/24/22 63.5 kg   BMI:  Body mass index is 21.29 kg/m.  Estimated Nutritional Needs:   Kcal:  1600-1800  Protein:  80-95g  Fluid:  >/=1.6L  Clayborne Dana, RDN, LDN Clinical Nutrition

## 2022-10-31 NOTE — Progress Notes (Signed)
PROGRESS NOTE   Erica Ball  WGN:[id number] DOB: May 17, 1950 DOA: 10/23/2022 PCP: Norm Salt, PA   Date of Service: the patient was seen and examined on 10/31/2022  Brief Narrative:  Erica Ball is a 72 y.o. female with a history of alcohol abuse, tobacco abuse, hypertension, COPD.  Patient presented secondary to a fall and subsequent left leg pain and found to have a left tibial plateau fracture.  Orthopedic surgery was consulted and recommended nonoperative management with application of left knee immobilizer and nonweightbearing to the right lower extremity.  Patient evaluated by PT/OT with recommendation for SNF.  Patient developed progressively worsening lethargy prior to discharge. MRI on 6/24 revealed evidence of acute/subacute stroke.    Assessment and Plan:  Tibial plateau fracture, left, closed Secondary to fall. Orthopedic surgery was consulted and they were recommending conservative management with left knee immobilizer and NWB on left, patient will be weightbearing as tolerated on right. -Continue with pain management -PT/OT recommending SNF at discharge -[facility] consult  Left ankle fracture Noted on CT imaging and likely related to recent fall. -Orthopedic surgery recommendations: NWB left leg  Encephalopathy Patient was alert today and able to have a conversation however slowly. Seems to be improving, however I do not know her baseline. Initially thought related to Librium. Progressively worsened and now persistent. Librium last given on 6/20. CT head unremarkable. No fevers. TSH normal. No hypoglycemia. Vitamin B12 normal. CRP and sed rate mildly elevated. This seems likely to be metabolic in etiology, but unsure of exact etiology. ABG on 6/24 with compensated mild hypercapnia.  EEG with right hemispheric slowing, no seizure. -Continue to monitor -Follow-up vitamin B1 -Follow-up neurology recommendations  Subacute stroke, incidental finding CT head chronic  right pontine, b/l periventricular WM and subcortical infarcts. 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 CTA head and neck no intracranial LVO, mild stenosis right V1, no other hemodynamically significant stenosis in the neck LDL of 62. 2D Echo EF 60-65% -Follow-up neurology recommendations -PT/OT/SLP evaluation -Aspirin 81 mg, no DAPT given stroke is subacute -Follow-up in stroke clinic in 4 weeks  Hyperlipidemia -Continue Crestor 10 mg  Hyponatremia Na 134 today. Improved from 133. Likely secondary to heavy alcohol use. Hyponatremia lab labs with hypotonic hyponatremia with low serum and urine osmolality.  Urinary sodium at 15.  TSH normal.  Improved with saline bolus. -Monitor sodium and if no improvement then she might need salt tablets  Elevated troponins  Tachycardia  HR 83-100 today, improving from 113-155 yesterday. Trop yesterday [id number]. EKG with sinus tachycardia with evidence of J-point depression. No chest pain. Tachycardia possibly rebound from cessation of metoprolol PO. -Continue telemetry -Continue metoprolol  Alcohol abuse Patient drinks heavily daily.  Denies any prior withdrawals or seizures. -Counseling was provided-does not have much desire to quit -CIWA protocol -Continue multivitamin, thiamine, folic acid as able   Hypertension BP 163/88 Blood pressure was elevated. Started on hydralazine and metoprolol as inpatient for presumed chronic hypertension -Continue hydralazine and metoprolol -Establish with PCP as outpatient  COPD GOLD 0 / still smoking  No acute exacerbation -Continue with as needed bronchodilator  Tobacco abuse Counseling was provided. -Patient apparently declined nicotine  patch  Subjective:  Pt chatty this morning. No acute concerns.   Physical Exam:  Vitals:   10/31/22 0518 10/31/22 7846 10/31/22 0710 10/31/22  0752  BP: (!) 177/97   (!) 163/88  Pulse: 95   83  Resp: 16 (!) 21 17 20   Temp: 98.1 F (36.7 C)   97.8 F (36.6 C)  TempSrc: Oral   Oral  SpO2: 98%   98%  Weight:      Height:        General: Alert, no acute distress, frail appearing Caucasian female Cardio: Normal S1 and S2, RRR, no r/m/g Pulm: CTAB, normal work of breathing Abdomen: Bowel sounds normal. Abdomen soft and non-tender.  Extremities: No peripheral edema.  Neuro: Cranial nerves grossly intact   Data Reviewed:  I have personally reviewed and interpreted labs, imaging.   CBC: Recent Labs  Lab 10/25/22 0049 10/26/22 0625 10/29/22 1325  WBC 8.4 9.4 6.9  HGB 13.0 13.5 13.3  HCT 37.7 39.4 38.9  MCV 97.2 98.3 98.7  PLT 268 272 322   Basic Metabolic Panel: Recent Labs  Lab 10/25/22 0049 10/26/22 0625 10/27/22 0216 10/28/22 1059 10/29/22 0746 10/30/22 0725 10/31/22 0704  NA 125* 127* 128* 129* 130* 133*  --   K 3.9 3.9 3.7 3.8 3.8 4.2  --   CL 87* 89* 92* 92* 94* 96*  --   CO2 30 26 26 27 27 25   --   GLUCOSE 105* 86 104* 104*  106* 127* 126*  --   BUN 11 8 12 9 11 17   --   CREATININE 0.57 0.54 0.60 0.51 0.53 0.53 0.52  CALCIUM 8.5* 8.5* 8.2* 8.7* 9.0 8.8*  --   MG 2.2  --   --   --   --  1.7  --    GFR: Estimated Creatinine Clearance: 62.8 mL/min (by C-G formula based on SCr of 0.52 mg/dL). Liver Function Tests: No results for input(s): "AST", "ALT", "ALKPHOS", "BILITOT", "PROT", "ALBUMIN" in the last 168 hours.  Coagulation Profile: Recent Labs  Lab 10/24/22 0912  INR 1.1   Code Status:  Full code.  Code status decision has been confirmed with: patient  Family Communication:   Severity of Illness:  The appropriate patient status for this patient is INPATIENT. Inpatient status is judged to be reasonable and necessary in order to provide  the required intensity of service to ensure the patient's safety. The patient's presenting symptoms, physical exam findings, and initial radiographic and laboratory data in the context of their chronic comorbidities is felt to place them at high risk for further clinical deterioration. Furthermore, it is not anticipated that the patient will be medically stable for discharge from the hospital within 2 midnights of admission.   * I certify that at the point of admission it is my clinical judgment that the patient will require inpatient hospital care spanning beyond 2 midnights from the point of admission due to high intensity of service, high risk for further deterioration and high frequency of surveillance required.*   Author:  Rolm Gala MD  10/31/2022 8:46 AM

## 2022-10-31 NOTE — Progress Notes (Signed)
Physical Therapy Treatment Patient Details Name: Erica Ball MRN: [id number] DOB: March 24, 1951 Today's Date: 10/31/2022   History of Present Illness 72 year old female admitted 6/18 after fall, founmd to have left tibial plateau fracture and right metatarsal fracture; CT r/o metatarsal fractures on Rt 6/19. PMHx: alcohol abuse, tobacco abuse, HTN, COPD, mobility issues    PT Comments    Pt with fair tolerance to treatment today. Pt much more alert and awake today compared to previous session however continues to require +2 Max A for all mobility and cues for NWB status. No change in DC/DME recs at this time. PT will continue to follow.  Recommendations for follow up therapy are one component of a multi-disciplinary discharge planning process, led by the attending physician.  Recommendations may be updated based on patient status, additional functional criteria and insurance authorization.  Follow Up Recommendations  Can patient physically be transported by private vehicle: No    Assistance Recommended at Discharge Frequent or constant Supervision/Assistance  Patient can return home with the following Two people to help with walking and/or transfers;Two people to help with bathing/dressing/bathroom;Assist for transportation;Help with stairs or ramp for entrance   Equipment Recommendations  Other (comment) (Per accepting facility)    Recommendations for Other Services       Precautions / Restrictions Precautions Precautions: Fall Restrictions Weight Bearing Restrictions: Yes RLE Weight Bearing: Weight bearing as tolerated LLE Weight Bearing: Non weight bearing     Mobility  Bed Mobility Overal bed mobility: Needs Assistance Bed Mobility: Supine to Sit, Sit to Supine     Supine to sit: Max assist, +2 for physical assistance Sit to supine: +2 for physical assistance, Max assist   General bed mobility comments: BLE repositioned, LLE elevated on pillows     Transfers Overall transfer level: Needs assistance Equipment used: Rolling walker (2 wheels) Transfers: Sit to/from Stand Sit to Stand: +2 physical assistance, Max assist           General transfer comment: Cues for WB precautions. Pt with heavy posterior lean and unable to come fully upright    Ambulation/Gait               General Gait Details: unable at this time.   Stairs             Wheelchair Mobility    Modified Rankin (Stroke Patients Only)       Balance Overall balance assessment: Needs assistance, History of Falls Sitting-balance support: No upper extremity supported, Feet supported Sitting balance-Leahy Scale: Fair Sitting balance - Comments: Pt with continues kyphotic posture but overall better than previous session.   Standing balance support: During functional activity, Bilateral upper extremity supported, Reliant on assistive device for balance Standing balance-Leahy Scale: Poor Standing balance comment: Reliant on RW                            Cognition Arousal/Alertness: Awake/alert Behavior During Therapy: Flat affect Overall Cognitive Status: No family/caregiver present to determine baseline cognitive functioning                                 General Comments: Pt appeared to be much more alert and oriented today. Pt was able to follow commands and at times would be tangential.        Exercises      General Comments General comments (skin integrity, edema, etc.): VSS  on RA      Pertinent Vitals/Pain Pain Assessment Pain Assessment: Faces Faces Pain Scale: Hurts a little bit Pain Location: LLE Pain Descriptors / Indicators: Discomfort, Grimacing Pain Intervention(s): Monitored during session    Home Living       Type of Home: Apartment                  Prior Function            PT Goals (current goals can now be found in the care plan section) Progress towards PT goals:  Progressing toward goals    Frequency    Min 3X/week      PT Plan Current plan remains appropriate    Co-evaluation              AM-PAC PT "6 Clicks" Mobility   Outcome Measure  Help needed turning from your back to your side while in a flat bed without using bedrails?: A Lot Help needed moving from lying on your back to sitting on the side of a flat bed without using bedrails?: A Lot Help needed moving to and from a bed to a chair (including a wheelchair)?: A Lot Help needed standing up from a chair using your arms (e.g., wheelchair or bedside chair)?: Total Help needed to walk in hospital room?: Total Help needed climbing 3-5 steps with a railing? : Total 6 Click Score: 9    End of Session Equipment Utilized During Treatment: Gait belt;Left knee immobilizer Activity Tolerance: Patient tolerated treatment well;Patient limited by pain Patient left: in bed;with call bell/phone within reach;with bed alarm set Nurse Communication: Mobility status;Precautions;Weight bearing status PT Visit Diagnosis: Unsteadiness on feet (R26.81);Repeated falls (R29.6);Muscle weakness (generalized) (M62.81);History of falling (Z91.81);Difficulty in walking, not elsewhere classified (R26.2);Pain Pain - Right/Left: Left Pain - part of body: Knee     Time: 1110-1131 PT Time Calculation (min) (ACUTE ONLY): 21 min  Charges:  $Therapeutic Activity: 8-22 mins                     Shela Nevin, PT, DPT Acute Rehab Services [phone]    Erica Ball 10/31/2022, 12:30 PM

## 2022-10-31 NOTE — Evaluation (Signed)
Speech Language Pathology Evaluation Patient Details Name: Erica Ball MRN: [id number] DOB: Jun 23, 1950 Today's Date: 10/31/2022 Time: 0932-3557 SLP Time Calculation (min) (ACUTE ONLY): 25 min  Problem List:  Patient Active Problem List   Diagnosis Date Noted   Tibial plateau fracture 10/24/2022   Hyponatremia 10/24/2022   Hypomagnesemia 10/24/2022   Tibial plateau fracture, left, closed, initial encounter 10/23/2022   Fall at home, initial encounter 10/23/2022   COPD GOLD 0 / still smoking  12/02/2017   Elevated troponin 07/10/2017   Pleural effusion on right 07/10/2017   UTI (urinary tract infection) 07/10/2017   Sepsis (HCC) 07/10/2017   Lobar pneumonia (HCC) 07/10/2017   Acute kidney injury (HCC) 07/10/2017   Acute respiratory failure with hypoxia (HCC) 07/10/2017   Alcohol abuse    Tobacco abuse 08/20/2014   Hypertension 10/30/2011   Diverticulitis 10/30/2011   Vitamin D deficiency 10/30/2011   Past Medical History:  Past Medical History:  Diagnosis Date   Acute kidney injury (HCC) 07/10/2017   Acute respiratory failure with hypoxia (HCC) 07/10/2017   Alcohol abuse    Allergy    Cataract    Diverticulitis 10/30/2011   Elevated troponin 07/10/2017   Hypertension    Lobar pneumonia (HCC) 07/10/2017   Pleural effusion on right 07/10/2017   Sepsis (HCC) 07/10/2017   Tobacco abuse    UTI (urinary tract infection) 07/10/2017   Vitamin D deficiency 10/30/2011   Past Surgical History:  Past Surgical History:  Procedure Laterality Date   ABDOMINAL HYSTERECTOMY     CATARACT EXTRACTION     FRACTURE SURGERY     IR THORACENTESIS ASP PLEURAL SPACE W/IMG GUIDE  07/11/2017   VIDEO ASSISTED THORACOSCOPY (VATS)/EMPYEMA Right 07/12/2017   Procedure: right VIDEO ASSISTED THORACOSCOPY (VATS) for drainage of EMPYEMA and decortication;  Surgeon: Loreli Slot, MD;  Location: [facility] OR;  Service: Thoracic;  Laterality: Right;   VIDEO BRONCHOSCOPY N/A 07/12/2017   Procedure: VIDEO  BRONCHOSCOPY;  Surgeon: Loreli Slot, MD;  Location: [facility] OR;  Service: Thoracic;  Laterality: N/A;   HPI:  72 year old female admitted 6/18 after fall, found to have left tibial plateau fracture and right metatarsal fracture; CT r/o metatarsal fractures on Rt 6/19. Patient developed progressively worsening lethargy prior to discharge. MRI brain was obtained on 6/24, which revealed scattered acute/subacute ischemic infarcts bilaterally. PMHx: alcohol abuse, tobacco abuse, HTN, COPD, mobility issues   Assessment / Plan / Recommendation Clinical Impression  Pt seen for speech-language-cognitive assessment. She was awake but somewhat groggy and suspect pain meds having an effect. Oromotor abilities are intact. She was able to comprehend throughout assessment and express herself clearly with intelligible speech although slow processing. She scored a 13/30 on the SLUMS with impairments in awareness, problem solving memory, divergent naming. Pt would benefit from continued ST in acute and will likely need continued therapy in < 3 hours a day.    SLP Assessment  SLP Recommendation/Assessment: Patient needs continued Speech Lanaguage Pathology Services SLP Visit Diagnosis: Cognitive communication deficit (R41.841)    Recommendations for follow up therapy are one component of a multi-disciplinary discharge planning process, led by the attending physician.  Recommendations may be updated based on patient status, additional functional criteria and insurance authorization.    Follow Up Recommendations  Skilled nursing-short term rehab (<3 hours/day)    Assistance Recommended at Discharge  Intermittent Supervision/Assistance  Functional Status Assessment Patient has had a recent decline in their functional status and demonstrates the ability to make significant improvements in function in  a reasonable and predictable amount of time.  Frequency and Duration min 2x/week  2 weeks      SLP  Evaluation Cognition  Overall Cognitive Status: Impaired/Different from baseline Arousal/Alertness: Awake/alert Orientation Level: Oriented to person;Oriented to place;Oriented to situation (partial year) Year: 2024 Month: June Day of Week: Incorrect Attention: Sustained Sustained Attention: Appears intact Memory: Impaired (1/4 used words from [employer]) Memory Impairment: Retrieval deficit;Storage deficit Awareness: Impaired Awareness Impairment: Anticipatory impairment Problem Solving: Impaired Problem Solving Impairment: Functional basic Safety/Judgment: Impaired       Comprehension  Auditory Comprehension Overall Auditory Comprehension: Appears within functional limits for tasks assessed Commands: Within Functional Limits Conversation: Simple Visual Recognition/Discrimination Discrimination: Not tested Reading Comprehension Reading Status: Not tested    Expression Expression Primary Mode of Expression: Verbal Verbal Expression Overall Verbal Expression: Appears within functional limits for tasks assessed Initiation: No impairment Level of Generative/Spontaneous Verbalization: Conversation Repetition:  (NT) Naming: Impairment Confrontation: Within functional limits Divergent:  (named 10 animals one minute) Written Expression Dominant Hand: Right Written Expression: Not tested   Oral / Motor  Oral Motor/Sensory Function Overall Oral Motor/Sensory Function: Within functional limits Motor Speech Overall Motor Speech: Appears within functional limits for tasks assessed Respiration: Within functional limits Phonation: Normal Resonance: Within functional limits Articulation: Within functional limitis Intelligibility: Intelligible Motor Planning: Witnin functional limits Motor Speech Errors: Not applicable            Royce Macadamia 10/31/2022, 10:17 AM

## 2022-11-01 ENCOUNTER — Encounter (HOSPITAL_COMMUNITY): Payer: Self-pay | Admitting: Family Medicine

## 2022-11-01 DIAGNOSIS — I4719 Other supraventricular tachycardia: Secondary | ICD-10-CM | POA: Diagnosis not present

## 2022-11-01 DIAGNOSIS — S82142A Displaced bicondylar fracture of left tibia, initial encounter for closed fracture: Secondary | ICD-10-CM | POA: Diagnosis not present

## 2022-11-01 LAB — BASIC METABOLIC PANEL
Anion gap: 8 (ref 5–15)
BUN: 14 mg/dL (ref 8–23)
CO2: 29 mmol/L (ref 22–32)
Calcium: 8.7 mg/dL — ABNORMAL LOW (ref 8.9–10.3)
Chloride: 96 mmol/L — ABNORMAL LOW (ref 98–111)
Creatinine, Ser: 0.45 mg/dL (ref 0.44–1.00)
GFR, Estimated: >60 mL/min (ref >60)
Glucose, Bld: 107 mg/dL — ABNORMAL HIGH (ref 70–99)
Potassium: 3.7 mmol/L (ref 3.5–5.1)
Sodium: 133 mmol/L — ABNORMAL LOW (ref 135–145)

## 2022-11-01 LAB — GLUCOSE, CAPILLARY
Glucose-Capillary: 109 mg/dL — ABNORMAL HIGH (ref 70–99)
Glucose-Capillary: 139 mg/dL — ABNORMAL HIGH (ref 70–99)
Glucose-Capillary: 150 mg/dL — ABNORMAL HIGH (ref 70–99)
Glucose-Capillary: 97 mg/dL (ref 70–99)

## 2022-11-01 LAB — MAGNESIUM: Magnesium: 1.8 mg/dL (ref 1.7–2.4)

## 2022-11-01 LAB — CBC
HCT: 37.6 % (ref 36.0–46.0)
Hemoglobin: 12.8 g/dL (ref 12.0–15.0)
MCH: 34.7 pg — ABNORMAL HIGH (ref 26.0–34.0)
MCHC: 34 g/dL (ref 30.0–36.0)
MCV: 101.9 fL — ABNORMAL HIGH (ref 80.0–100.0)
Platelets: 365 10*3/uL (ref 150–400)
RBC: 3.69 MIL/uL — ABNORMAL LOW (ref 3.87–5.11)
RDW: 12.4 % (ref 11.5–15.5)
WBC: 7.1 10*3/uL (ref 4.0–10.5)
nRBC: 0 % (ref 0.0–0.2)

## 2022-11-01 MED ORDER — METOPROLOL TARTRATE 5 MG/5ML IV SOLN
5.0000 mg | Freq: Once | INTRAVENOUS | Status: AC
  Administered 2022-11-01: 5 mg via INTRAVENOUS

## 2022-11-01 MED ORDER — METOPROLOL TARTRATE 25 MG PO TABS
25.0000 mg | ORAL_TABLET | Freq: Three times a day (TID) | ORAL | Status: DC
  Administered 2022-11-01 – 2022-11-03 (×7): 25 mg via ORAL
  Filled 2022-11-01 (×7): qty 1

## 2022-11-01 MED ORDER — METOPROLOL TARTRATE 5 MG/5ML IV SOLN
5.0000 mg | INTRAVENOUS | Status: DC | PRN
  Administered 2022-11-02 – 2022-11-04 (×4): 5 mg via INTRAVENOUS
  Filled 2022-11-01 (×5): qty 5

## 2022-11-01 MED ORDER — ADENOSINE 6 MG/2ML IV SOLN
6.0000 mg | Freq: Once | INTRAVENOUS | Status: AC
  Administered 2022-11-01: 6 mg via INTRAVENOUS
  Filled 2022-11-01: qty 2

## 2022-11-01 MED ORDER — SODIUM CHLORIDE 0.9 % IV SOLN: INTRAVENOUS | Status: DC

## 2022-11-01 NOTE — Significant Event (Signed)
Rapid Response Event Note   Reason for Call :  tachycardia  Initial Focused Assessment:  Patient is alert and oriented. She denies dizziness, chest pain or shortness of breath.  BP 111/82  HR 130-150s  RR 18  O2 sat 96% on RA SVT Cardiology at bedside: Dr Royann Shivers and Annabelle Harman PA   Interventions:  5mg  Lopressor IV 12 lead EKG 6mg  Adenosine IV 25mg  Lopressor PO  HR now SR 80s  Plan of Care:    Event Summary:   MD Notified: Dr Royann Shivers  at bedside Call Time: 0938 Arrival Time: 7829 End Time: 1040  Erica Millin, RN

## 2022-11-01 NOTE — Plan of Care (Signed)
  Problem: Education: Goal: Knowledge of General Education information will improve Description: Including pain rating scale, medication(s)/side effects and non-pharmacologic comfort measures Outcome: Progressing   Problem: Education: Goal: Knowledge of disease or condition will improve 11/01/2022 1703 by Juluis Mire, RN Outcome: Progressing 11/01/2022 1701 by Juluis Mire, RN Outcome: Progressing Goal: Knowledge of secondary prevention will improve (MUST DOCUMENT ALL) Outcome: Progressing Goal: Knowledge of patient specific risk factors will improve Loraine Leriche N/A or DELETE if not current risk factor) 11/01/2022 1703 by Juluis Mire, RN Outcome: Progressing 11/01/2022 1701 by Juluis Mire, RN Outcome: Progressing   Problem: Ischemic Stroke/TIA Tissue Perfusion: Goal: Complications of ischemic stroke/TIA will be minimized Outcome: Progressing

## 2022-11-01 NOTE — Progress Notes (Signed)
PROGRESS NOTE   Erica Ball  ZOX:[id number] DOB: 03/23/1951 DOA: 10/23/2022 PCP: Norm Salt, PA   Date of Service: the patient was seen and examined on 11/01/2022  Brief Narrative:  Erica Ball is a 72 y.o. female with a history of alcohol abuse, tobacco abuse, hypertension, COPD.  Patient presented secondary to a fall and subsequent left leg pain and found to have a left tibial plateau fracture.  Orthopedic surgery was consulted and recommended nonoperative management with application of left knee immobilizer and nonweightbearing to the right lower extremity.  Patient evaluated by PT/OT with recommendation for SNF.  Patient developed progressively worsening lethargy prior to discharge. MRI on 6/24 revealed evidence of acute/subacute stroke.    Assessment and Plan:  SVT Received message from RN this that patient heart rate above 160.  Of note patient has been in and out of tachycardia this admission. EKG shows SVT at bedside.  Patient is very comfortable, asymptomatic, denies chest pain, no shortness of breath, diaphoresis etc. Examination unremarkable patient reports possibility of Wolf Parkinson White syndrome although is unsure.  Patient received 1 dose of IV metoprolol at bedside with resolution of heart rate.  Vital signs stable which is reassuring. CIWA 0.  Labs not drawn this morning -Vital signs every 15 minutes -IV metoprolol 5 mg x 3 doses every 10 minutes necessary. -Continue telemetry -Explained bedside vagal maneuvers such as ice and carotid massage to RN -Cardiology consult -IV metoprolol 5 mg x 3 doses every 10 minutes necessary.  -Stat CBC, BMP and magnesium -starting mIVF  Tibial plateau fracture, left, closed Secondary to fall. Orthopedic surgery was consulted and they were recommending conservative management with left knee immobilizer and NWB on left, patient will be weightbearing as tolerated on right. -Continue with pain management -PT/OT  recommending SNF at discharge -[facility] consult  Left ankle fracture Noted on CT imaging and likely related to recent fall. -Orthopedic surgery recommendations: NWB left leg  Encephalopathy Stable Initially thought related to Librium. Progressively worsened and now persistent. Librium last given on 6/20. CT head unremarkable. No fevers. TSH normal. No hypoglycemia. Vitamin B12 normal. CRP and sed rate mildly elevated. This seems likely to be metabolic in etiology, but unsure of exact etiology. ABG on 6/24 with compensated mild hypercapnia.  EEG with right hemispheric slowing, no seizure. -Continue to monitor  Subacute stroke, incidental finding CT head chronic right pontine, b/l periventricular WM and subcortical infarcts. 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 CTA head and neck no intracranial LVO, mild stenosis right V1, no other hemodynamically significant stenosis in the neck LDL of 62. 2D Echo EF 60-65% -Neurology signed off -Aspirin 81 mg, no DAPT given stroke is subacute -Follow-up in stroke clinic in 4 weeks  Hyperlipidemia -Continue Crestor 10 mg  Hyponatremia Na 133 today. Likely secondary to heavy alcohol use. Hyponatremia lab labs with hypotonic hyponatremia with low serum and urine osmolality.  Urinary sodium at 15.  TSH normal.  -Monitor sodium and if no improvement then she might need salt tablets  Alcohol abuse Patient drinks heavily daily.  Denies any prior withdrawals or seizures. -Counseling was provided-does not have much desire to quit -CIWA protocol -Continue multivitamin, thiamine, folic acid as able   Hypertension BP 112/74. Started on hydralazine and metoprolol as inpatient for presumed chronic hypertension -Continue  hydralazine  and metoprolol -Establish with PCP as outpatient  COPD GOLD 0 / still smoking  Stable  -Continue with as needed bronchodilator  Tobacco abuse Counseling was provided. -Patient apparently declined nicotine patch  Subjective:  Patient denies concerns this morning.  Denies chest pain shortness of breath, dizziness etc.  Physical Exam:  Vitals:   10/31/22 1950 11/01/22 0018 11/01/22 0410 11/01/22 0707  BP: (!) 146/91 137/78 (!) 173/88 125/80  Pulse: (!) 105 93 85 (!) 167  Resp: 20 18 18 18   Temp: 97.7 F (36.5 C) 97.8 F (36.6 C) 98 F (36.7 C) 98 F (36.7 C)  TempSrc: Oral Oral Oral Oral  SpO2: 96% 96% 97%   Weight:      Height:        General: Alert, no acute distress Cardio: Normal S1 and S2, RRR, no r/m/g Pulm: CTAB, normal work of breathing Abdomen: Bowel sounds normal. Abdomen soft and non-tender.  Extremities: No peripheral edema.  Neuro: Cranial nerves grossly intact   Data Reviewed:  I have personally reviewed and interpreted labs, imaging.   CBC: Recent Labs  Lab 10/26/22 0625 10/29/22 1325 10/31/22 0959  WBC 9.4 6.9 8.1  HGB 13.5 13.3 12.7  HCT 39.4 38.9 37.7  MCV 98.3 98.7 99.5  PLT 272 322 351    Basic Metabolic Panel: Recent Labs  Lab 10/27/22 0216 10/28/22 1059 10/29/22 0746 10/30/22 0725 10/31/22 0704 10/31/22 0959  NA 128* 129* 130* 133*  --  134*  K 3.7 3.8 3.8 4.2  --  3.8  CL 92* 92* 94* 96*  --  96*  CO2 26 27 27 25   --  27  GLUCOSE 104* 104*  106* 127* 126*  --  116*  BUN 12 9 11 17   --  12  CREATININE 0.60 0.51 0.53 0.53 0.52 0.38*  CALCIUM 8.2* 8.7* 9.0 8.8*  --  8.8*  MG  --   --   --  1.7  --  1.7    GFR: Estimated Creatinine Clearance: 62.8 mL/min (A) (by C-G formula based on SCr of 0.38 mg/dL (L)). Liver Function Tests: No results for input(s): "AST", "ALT", "ALKPHOS", "BILITOT", "PROT", "ALBUMIN" in the last 168 hours.  Coagulation Profile: No results for input(s): "INR", "PROTIME" in the last  168 hours.  Code Status:  Full code.  Code status decision has been confirmed with: patient  Family Communication:   Severity of Illness:  The appropriate patient status for this patient is INPATIENT. Inpatient status is judged to be reasonable and necessary in order to provide the required intensity of service to ensure the patient's safety. The patient's presenting symptoms, physical exam findings, and initial radiographic and laboratory data in the context of their chronic comorbidities is felt to place them at high risk for further clinical deterioration. Furthermore, it is not anticipated that the patient will be medically stable for discharge from the hospital within 2 midnights of admission.   * I certify that at the point of admission it is my clinical judgment that the patient will require inpatient hospital care spanning beyond 2 midnights from the point of admission due to high intensity of service, high risk for further deterioration and high frequency of surveillance required.*   Author:  Rolm Gala MD  11/01/2022 7:50 AM

## 2022-11-01 NOTE — Progress Notes (Signed)
Speech Language Pathology Treatment: Dysphagia  Patient Details Name: Erica Ball MRN: [id number] DOB: [birth year] Today's Date: 11/01/2022 Time: 4098-1191 SLP Time Calculation (min) (ACUTE ONLY): 10 min  Assessment / Plan / Recommendation Clinical Impression  Pt seen for possible upgrade- had been placed on Dys 1 and nectar when she was not as alert and showing s/s aspiration. Had rapid response called this am for increased heart rate - MD present and pt asymptomatic per MD. Pt alert and feeding self breakfast. She consumed cup and straw sips thin without s/s aspiration consistently. She masticated trials graham cracker with adequate mastication, bolus control, transit without residue. Pt able to upgrade to regular/thin liquids, sitting in upright position, pills whole in puree. No follow for swallow but will continue to follow for cognition s/p acute stroke this admission.    HPI HPI: 72 year old female admitted 6/18 after fall, found to have left tibial plateau fracture and right metatarsal fracture; CT r/o metatarsal fractures on Rt 6/19. Patient developed progressively worsening lethargy prior to discharge. MRI brain was obtained on 6/24, which revealed scattered acute/subacute ischemic infarcts bilaterally. PMHx: alcohol abuse, tobacco abuse, HTN, COPD, mobility issues      SLP Plan  All goals met      Recommendations for follow up therapy are one component of a multi-disciplinary discharge planning process, led by the attending physician.  Recommendations may be updated based on patient status, additional functional criteria and insurance authorization.    Recommendations  Diet recommendations: Regular;Thin liquid Liquids provided via: Cup;Straw Medication Administration: Whole meds with puree Supervision: Patient able to self feed Compensations: Slow rate;Small sips/bites Postural Changes and/or Swallow Maneuvers: Seated upright 90 degrees                  Oral care  BID   Intermittent Supervision/Assistance Dysphagia, unspecified (R13.10)     All goals met     Royce Macadamia  11/01/2022, 9:52 AM

## 2022-11-01 NOTE — Consult Note (Addendum)
Cardiology Consultation   Patient ID: Erica Ball MRN: [id number]; DOB: 02-13-1951  Admit date: 10/23/2022 Date of Consult: 11/01/2022  PCP:  Norm Salt, PA   [facility] Providers Cardiologist:  None (remotely saw Dr. Eden Emms last in 2019) Click here to update MD or APP on Care Team, Refresh:1}     Patient Profile:   Erica Ball is a 72 y.o. female with a hx of HTN, COPD, mobility issues, prior SVT in the setting of acute illness, ETOH abuse, tobacco abuse who is being seen 11/01/2022 for the evaluation of SVT at the request of Dr. Allena Katz.  History of Present Illness:   Erica Ball remotely saw Dr. Eden Emms one time in the outpatient setting in 2019 following an admission for URI, COPD exacerbation, and empyema requiring VATS - while in the hospital she had SVT with rates 180s that converted with IV metoprolol. Echo was reassuring. She also had elevated troponin suspected due to demand ischemia. Subsequent stress test 12/2017 was low risk, no ischemia identified, small defect of mild severity, non reversible present in the apex location, EF 82% without WMA. Someone entered h/o [employer] in remote DC summary in 2006. This was not mentioned in prior cardiology note. The patient states that her PCP was the first one to mention this many years ago, that she was referred out to someone for evaluation and was told she did not have this, and that was the last she heard of it. She does not appear to have been on AVN blocking agents or antihypertensives prior to this admission. Her brother sees Dr. Eden Emms for multiple prior stents and also has some sort of device (ICD vs PPM).   She was admitted on 10/23/2022 with fall. She was backing up to the sofa with her walker. She missed the seat and fell on the carpet. She laid there for hours then called her brother who assisted her back to the couch. The next morning her foot and knee were swollen so she came to the ED.  She was found to have left tibial plateau fracture, left ankle fracture, right metatarsal fractures. Per notes, these are to be managed nonoperatively. Hospital course also notable for acute encephalopathy, ETOH abuse requiring Librium taper, subacute strokes seen by neuro recommending ASA 81mg  daily, and hyponatremia. Lopressor 25mg  BID was started early in admission for HTN. This was discontinued on 10/29/22 in the setting of managing BP with hydralazine and losartan instead. On 10/30/22, she had an episode of SVT from around 330 until 10 AM associated with minimal troponin elevation ([phone]) without obvious chest pain. She had received 2 doses of IV metoprolol that day (2.5mg  at 0348 and 5mg  at 0939) and spontaneously converted around 10am. There was a period of time where she had 2 different HR while in SVT, one in the lower 110 range, before returning to NSR. She also had brief recurrence of SVT yesterday. She received 5mg  IV metoprolol yesterday morning. Echo this admission showed EF 60-65%, G1DD, mild LAE, mild-moderate AI. This morning she went back into a narrow complex SVT initially with rates in the 160s. She received 5mg  IV metoprolol which initially broke the arrhythmia into NSR with PACs but then soon went back out of rhythm again into SVT with HR 140s-150s. The patient is presently comfortable and hemodynamically stable. She denies any CP, SOB, palpitations, or awareness of arrhythmia.   Past Medical History:  Diagnosis Date   Acute kidney injury (HCC) 07/10/2017  Acute respiratory failure with hypoxia (HCC) 07/10/2017   Alcohol abuse    Allergy    Cataract    Diverticulitis 10/30/2011   Elevated troponin 07/10/2017   Hypertension    Lobar pneumonia (HCC) 07/10/2017   Pleural effusion on right 07/10/2017   Sepsis (HCC) 07/10/2017   SVT (supraventricular tachycardia)    Tobacco abuse    UTI (urinary tract infection) 07/10/2017   Vitamin D deficiency 10/30/2011    Past Surgical  History:  Procedure Laterality Date   ABDOMINAL HYSTERECTOMY     CATARACT EXTRACTION     FRACTURE SURGERY     IR THORACENTESIS ASP PLEURAL SPACE W/IMG GUIDE  07/11/2017   VIDEO ASSISTED THORACOSCOPY (VATS)/EMPYEMA Right 07/12/2017   Procedure: right VIDEO ASSISTED THORACOSCOPY (VATS) for drainage of EMPYEMA and decortication;  Surgeon: Loreli Slot, MD;  Location: [facility] OR;  Service: Thoracic;  Laterality: Right;   VIDEO BRONCHOSCOPY N/A 07/12/2017   Procedure: VIDEO BRONCHOSCOPY;  Surgeon: Loreli Slot, MD;  Location: [facility] OR;  Service: Thoracic;  Laterality: N/A;     Home Medications:  Prior to Admission medications   Medication Sig Start Date End Date Taking? Authorizing Provider  albuterol (PROVENTIL HFA;VENTOLIN HFA) 108 (90 Base) MCG/ACT inhaler Inhale 1-2 puffs into the lungs every 4 (four) hours as needed for shortness of breath. 07/10/17  Yes [provider]  EPINEPHrine (PRIMATENE MIST IN) Inhale 1 puff into the lungs daily as needed (congestion).   Yes [provider]  aspirin EC 81 MG tablet Take 1 tablet (81 mg total) by mouth daily. Patient not taking: Reported on 10/23/2022 12/06/17   Wendall Stade, MD  traMADol (ULTRAM) 50 MG tablet Take 1 tablet (50 mg total) by mouth every 6 (six) hours as needed. Patient not taking: Reported on 10/23/2022 08/06/17   Loreli Slot, MD    Inpatient Medications: Scheduled Meds:  adenosine (ADENOCARD) IV  6 mg Intravenous Once   aspirin  81 mg Oral Daily   enoxaparin (LOVENOX) injection  40 mg Subcutaneous Q24H   folic acid  1 mg Oral Daily   hydrALAZINE  50 mg Oral Q8H   losartan  25 mg Oral Daily   metoprolol tartrate  5 mg Intravenous Once   multivitamin with minerals  1 tablet Oral Daily   nicotine  14 mg Transdermal Daily   rosuvastatin  10 mg Oral Daily   thiamine  100 mg Oral Daily   Or   thiamine  100 mg Intravenous Daily   Continuous Infusions:  sodium chloride 100 mL/hr at 11/01/22 0837    PRN Meds: acetaminophen, food thickener, hydrALAZINE, ipratropium-albuterol, metoprolol tartrate, morphine injection, ondansetron **OR** ondansetron (ZOFRAN) IV, senna-docusate  Allergies:    Allergies  Allergen Reactions   Dextromethorphan Other (See Comments)    Makes her very angry and she wants to hurt people    Social History:   Social History   Socioeconomic History   Marital status: Divorced    Spouse name: Not on file   Number of children: Not on file   Years of education: Not on file   Highest education level: Not on file  Occupational History   Not on file  Tobacco Use   Smoking status: Every Day    Packs/day: 1.50    Years: 53.00    Additional pack years: 0.00    Total pack years: 79.50    Types: Cigarettes   Smokeless tobacco: Never  Substance and Sexual Activity   Alcohol use: Yes  Alcohol/week: 21.0 standard drinks of alcohol    Types: 21 Shots of liquor per week    Comment: drinks three cups of vodka per day   Drug use: No   Sexual activity: Not Currently  Other Topics Concern   Not on file  Social History Narrative   Not on file   Social Determinants of Health   Financial Resource Strain: Not on file  Food Insecurity: No Food Insecurity (10/24/2022)   Hunger Vital Sign    Worried About Running Out of Food in the Last Year: Never true    Ran Out of Food in the Last Year: Never true  Transportation Needs: No Transportation Needs (10/24/2022)   PRAPARE - Administrator, Civil Service (Medical): No    Lack of Transportation (Non-Medical): No  Physical Activity: Not on file  Stress: Not on file  Social Connections: Not on file  Intimate Partner Violence: Not At Risk (10/24/2022)   Humiliation, Afraid, Rape, and Kick questionnaire    Fear of Current or Ex-Partner: No    Emotionally Abused: No    Physically Abused: No    Sexually Abused: No    Family History:   Family History  Problem Relation Age of Onset   Hypertension Mother     Heart disease Mother    Hypertension Father    Heart disease Father    Stroke Brother    Hypertension Brother    Heart disease Brother     ROS:  Please see the history of present illness.  All other ROS reviewed and negative.     Physical Exam/Data:   Vitals:   11/01/22 0018 11/01/22 0410 11/01/22 0707 11/01/22 0900  BP: 137/78 (!) 173/88 125/80 132/81  Pulse: 93 85 (!) 167   Resp: 18 18 18 18   Temp: 97.8 F (36.6 C) 98 F (36.7 C) 98 F (36.7 C)   TempSrc: Oral Oral Oral Oral  SpO2: 96% 97%  95%  Weight:      Height:        Intake/Output Summary (Last 24 hours) at 11/01/2022 0943 Last data filed at 11/01/2022 0600 Gross per 24 hour  Intake 720 ml  Output 450 ml  Net 270 ml      10/24/2022   12:30 AM 12/12/2017    9:58 AM 12/06/2017    9:19 AM  Last 3 Weights  Weight (lbs) 140 lb 140 lb 140 lb 8 oz  Weight (kg) 63.504 kg 63.504 kg 63.73 kg     Body mass index is 21.29 kg/m.  General: Well developed, well nourished, in no acute distress. Head: Normocephalic, atraumatic, sclera non-icteric, no xanthomas, nares are without discharge. Neck: Negative for carotid bruits. JVP not elevated. Lungs: Clear bilaterally to auscultation without wheezes, rales, or rhonchi. Breathing is unlabored. Heart: Tachycardic, regular, S1 S2 without murmurs, rubs, or gallops.  Abdomen: Soft, non-tender, non-distended with normoactive bowel sounds. No rebound/guarding. Extremities: No clubbing or cyanosis. Foot deformity noted. No edema. Distal pedal pulses are 2+ and equal bilaterally. Neuro: Alert and oriented X 3. Moves all extremities spontaneously. Psych:  Responds to questions appropriately with a normal affect.   EKG:  The EKG was personally reviewed and demonstrates:  SVT 166bpm, diffuse ST Depression I, II, V3-V6 with STE isolated to avR felt likely rate related Last sinus EKG 6/25 showed NSR 84bpm, no acute STT changes  Telemetry:  Telemetry was personally reviewed and  demonstrates:  SVT as outlined above   Relevant CV Studies: Echo  10/30/22   1. Left ventricular ejection fraction, by estimation, is 60 to 65%. The  left ventricle has normal function. The left ventricle has no regional  wall motion abnormalities. Left ventricular diastolic parameters are  consistent with Grade I diastolic  dysfunction (impaired relaxation).   2. Right ventricular systolic function is normal. The right ventricular  size is normal.   3. Left atrial size was mildly dilated.   4. The mitral valve is normal in structure. No evidence of mitral valve  regurgitation. No evidence of mitral stenosis.   5. The aortic valve is normal in structure. Aortic valve regurgitation is  mild to moderate. No aortic stenosis is present.   6. The inferior vena cava is normal in size with greater than 50%  respiratory variability, suggesting right atrial pressure of 3 mmHg.   Laboratory Data:  High Sensitivity Troponin:   Recent Labs  Lab 10/30/22 0725 10/30/22 1133 10/30/22 1806  TROPONINIHS 45* 126* 134*     Chemistry Recent Labs  Lab 10/29/22 0746 10/30/22 0725 10/31/22 0704 10/31/22 0959  NA 130* 133*  --  134*  K 3.8 4.2  --  3.8  CL 94* 96*  --  96*  CO2 27 25  --  27  GLUCOSE 127* 126*  --  116*  BUN 11 17  --  12  CREATININE 0.53 0.53 0.52 0.38*  CALCIUM 9.0 8.8*  --  8.8*  MG  --  1.7  --  1.7  GFRNONAA >60 >60 >60 >60  ANIONGAP 9 12  --  11    No results for input(s): "PROT", "ALBUMIN", "AST", "ALT", "ALKPHOS", "BILITOT" in the last 168 hours. Lipids  Recent Labs  Lab 10/30/22 0058  CHOL 118  TRIG 46  HDL 47  LDLCALC 62  CHOLHDL 2.5    Hematology Recent Labs  Lab 10/26/22 0625 10/29/22 1325 10/31/22 0959  WBC 9.4 6.9 8.1  RBC 4.01 3.94 3.79*  HGB 13.5 13.3 12.7  HCT 39.4 38.9 37.7  MCV 98.3 98.7 99.5  MCH 33.7 33.8 33.5  MCHC 34.3 34.2 33.7  RDW 12.1 12.1 12.3  PLT 272 322 351   Thyroid No results for input(s): "TSH", "FREET4" in the last  168 hours.  BNPNo results for input(s): "BNP", "PROBNP" in the last 168 hours.  DDimer No results for input(s): "DDIMER" in the last 168 hours.   Radiology/Studies:  EEG adult  Result Date: 10/31/2022 Charlsie Quest, MD     10/31/2022 11:04 AM Patient Name: AERABELLA GALASSO MRN: [id number] Epilepsy Attending: Charlsie Quest Referring Physician/Provider: Marvel Plan, MD Date: 10/31/2022 Duration: 22.55 mins Patient history: 72yo F with ams getting eeg to evaluate for seizure. Level of alertness: Awake AEDs during EEG study: None Technical aspects: This EEG study was done with scalp electrodes positioned according to the 10-20 International system of electrode placement. Electrical activity was reviewed with band pass filter of 1-70Hz , sensitivity of 7 uV/mm, display speed of 62mm/sec with a 60Hz  notched filter applied as appropriate. EEG data were recorded continuously and digitally stored.  Video monitoring was available and reviewed as appropriate. Description: No clear posterior dominant rhythm was seen. EEG also showed continuous generalized and lateralized right hemisphere mixed frequencies with predominantly 6 to 9 Hz theta-alpha activity admixed with intermittent generalized 2 to 3 Hz delta slowing.  Physiologic photic driving was not seen during photic stimulation. Hyperventilation was not performed.   ABNORMALITY - Continuous slow, generalized and lateralized right hemisphere IMPRESSION: This study  is suggestive of cortical dysfunction arising from right hemisphere likely secondary to underlying structural abnormality. Additionally there is moderate diffuse encephalopathy. No seizures or epileptiform discharges were seen throughout the recording. Charlsie Quest   ECHOCARDIOGRAM COMPLETE  Result Date: 10/30/2022    ECHOCARDIOGRAM REPORT   Patient Name:   ELSA PLOCH Rotz Date of Exam: 10/30/2022 Medical Rec #:  [id number]         Height:       68.0 in Accession #:    [id number]        Weight:        140.0 lb Date of Birth:  December 18, 1950        BSA:          1.756 m Patient Age:    34 years          BP:           150/84 mmHg Patient Gender: F                 HR:           80 bpm. Exam Location:  Inpatient Procedure: 2D Echo, Color Doppler and Cardiac Doppler Indications:    CVA  History:        Patient has prior history of Echocardiogram examinations. COPD;                 Risk Factors:Hypertension and Current Smoker. Sepsis. ETOH                 abuse.  Sonographer:    Aron Baba Referring Phys: [phone] RALPH A NETTEY IMPRESSIONS  1. Left ventricular ejection fraction, by estimation, is 60 to 65%. The left ventricle has normal function. The left ventricle has no regional wall motion abnormalities. Left ventricular diastolic parameters are consistent with Grade I diastolic dysfunction (impaired relaxation).  2. Right ventricular systolic function is normal. The right ventricular size is normal.  3. Left atrial size was mildly dilated.  4. The mitral valve is normal in structure. No evidence of mitral valve regurgitation. No evidence of mitral stenosis.  5. The aortic valve is normal in structure. Aortic valve regurgitation is mild to moderate. No aortic stenosis is present.  6. The inferior vena cava is normal in size with greater than 50% respiratory variability, suggesting right atrial pressure of 3 mmHg. FINDINGS  Left Ventricle: Left ventricular ejection fraction, by estimation, is 60 to 65%. The left ventricle has normal function. The left ventricle has no regional wall motion abnormalities. The left ventricular internal cavity size was normal in size. There is  no left ventricular hypertrophy. Left ventricular diastolic parameters are consistent with Grade I diastolic dysfunction (impaired relaxation). Right Ventricle: The right ventricular size is normal. No increase in right ventricular wall thickness. Right ventricular systolic function is normal. Left Atrium: Left atrial size was mildly dilated.  Right Atrium: Right atrial size was normal in size. Pericardium: There is no evidence of pericardial effusion. Mitral Valve: The mitral valve is normal in structure. No evidence of mitral valve regurgitation. No evidence of mitral valve stenosis. Tricuspid Valve: The tricuspid valve is normal in structure. Tricuspid valve regurgitation is mild . No evidence of tricuspid stenosis. Aortic Valve: The aortic valve is normal in structure. Aortic valve regurgitation is mild to moderate. Aortic regurgitation PHT measures 677 msec. No aortic stenosis is present. Pulmonic Valve: The pulmonic valve was normal in structure. Pulmonic valve regurgitation is not visualized. No evidence of pulmonic stenosis. Aorta: The  aortic root is normal in size and structure. Venous: The inferior vena cava is normal in size with greater than 50% respiratory variability, suggesting right atrial pressure of 3 mmHg. IAS/Shunts: No atrial level shunt detected by color flow Doppler.  LEFT VENTRICLE PLAX 2D LVIDd:         4.40 cm   Diastology LVIDs:         2.90 cm   LV e' medial:    5.33 cm/s LV PW:         0.60 cm   LV E/e' medial:  13.7 LV IVS:        0.90 cm   LV e' lateral:   8.59 cm/s LVOT diam:     1.90 cm   LV E/e' lateral: 8.5 LV SV:         71 LV SV Index:   41 LVOT Area:     2.84 cm  RIGHT VENTRICLE RV S prime:     15.40 cm/s TAPSE (M-mode): 2.4 cm LEFT ATRIUM             Index        RIGHT ATRIUM           Index LA Vol (A2C):   74.8 ml 42.59 ml/m  RA Area:     12.70 cm LA Vol (A4C):   56.0 ml 31.89 ml/m  RA Volume:   26.00 ml  14.80 ml/m LA Biplane Vol: 70.7 ml 40.26 ml/m  AORTIC VALVE             PULMONIC VALVE LVOT Vmax:   129.00 cm/s PR End Diast Vel: 11.16 msec LVOT Vmean:  83.300 cm/s LVOT VTI:    0.252 m AI PHT:      677 msec  AORTA Ao Root diam: 3.50 cm Ao Asc diam:  3.20 cm MITRAL VALVE                TRICUSPID VALVE MV Area (PHT): 3.77 cm     TR Peak grad:   26.8 mmHg MV Decel Time: 201 msec     TR Vmax:        259.00  cm/s MV E velocity: 73.20 cm/s MV A velocity: 148.00 cm/s  SHUNTS MV E/A ratio:  0.49         Systemic VTI:  0.25 m                             Systemic Diam: 1.90 cm Kardie Tobb DO Electronically signed by Thomasene Ripple DO Signature Date/Time: 10/30/2022/3:20:07 PM    Final    CT ANGIO HEAD NECK W WO CM  Result Date: 10/30/2022 CLINICAL DATA:  Scattered acute and subacute infarcts on 10/29/2022 MRI, determine embolic source EXAM: CT ANGIOGRAPHY HEAD AND NECK WITH AND WITHOUT CONTRAST TECHNIQUE: Multidetector CT imaging of the head and neck was performed using the standard protocol during bolus administration of intravenous contrast. Multiplanar CT image reconstructions and MIPs were obtained to evaluate the vascular anatomy. Carotid stenosis measurements (when applicable) are obtained utilizing NASCET criteria, using the distal internal carotid diameter as the denominator. RADIATION DOSE REDUCTION: This exam was performed according to the departmental dose-optimization program which includes automated exposure control, adjustment of the mA and/or kV according to patient size and/or use of iterative reconstruction technique. CONTRAST:  75mL OMNIPAQUE IOHEXOL 350 MG/ML SOLN COMPARISON:  No prior CTA available, correlation is made with CT head 10/28/2022 and MRI 10/29/2022  FINDINGS: CT HEAD FINDINGS Paste that Brain: No evidence of acute hemorrhage, mass, mass effect, or midline shift. No hydrocephalus or extra-axial fluid collection. Redemonstrated scattered hypodense foci in the periventricular and subcortical white matter, some of which correlate with the acute and subacute infarcts seen on the 10/29/2022 MRI, although there is also a background of moderate chronic small vessel ischemic disease. Vascular: No hyperdense vessel. Atherosclerotic calcifications in the intracranial carotid and vertebral arteries. Skull: Negative for fracture or focal lesion. Sinuses/Orbits: Mucosal thickening in the ethmoid air cells  and right sphenoid sinus. Status post bilateral lens replacements. Other: The mastoid air cells are well aerated. CTA NECK FINDINGS Aortic arch: Standard branching. Imaged portion shows no evidence of aneurysm or dissection. No significant stenosis of the major arch vessel origins. Aortic atherosclerosis. Right carotid system: No evidence of dissection, occlusion, or hemodynamically significant stenosis (greater than 50%). Atherosclerotic disease at the bifurcation and in the proximal ICA is not hemodynamically significant. Left carotid system: No evidence of dissection, occlusion, or hemodynamically significant stenosis (greater than 50%). Atherosclerotic disease at the bifurcation and in the proximal ICA is not hemodynamically significant. Vertebral arteries: Mild stenosis in the proximal right V1. The vertebral arteries are otherwise patent to the skull base, without significant stenosis, dissection, or occlusion. Skeleton: Negative. Other neck: Emphysema. No focal pulmonary opacity or pleural effusion. Upper chest: No focal pulmonary opacity or pleural effusion. Review of the MIP images confirms the above findings CTA HEAD FINDINGS Anterior circulation: Both internal carotid arteries are patent to the termini, with calcifications but without significant stenosis. A1 segments patent. Normal anterior communicating artery. Anterior cerebral arteries are patent to their distal aspects without significant stenosis. No M1 stenosis or occlusion. MCA branches perfused to their distal aspects without significant stenosis. Posterior circulation: Vertebral arteries patent to the vertebrobasilar junction, with mild stenosis in the distal right V4, after the PICA takeoff. Left dominant system. Posterior inferior cerebellar arteries patent proximally. Basilar patent to its distal aspect without significant stenosis. Superior cerebellar arteries patent proximally. Patent left P1. Fetal origin of the right PCA from the right  posterior communicating artery. PCAs perfused to their distal aspects without significant stenosis. The left posterior communicating artery is also patent. Venous sinuses: Not well opacified due to phase of contrast. Anatomic variants: Fetal origin of the right PCA. Review of the MIP images confirms the above findings IMPRESSION: 1. No intracranial large vessel occlusion. Mild stenosis in the distal right V4. 2. Mild stenosis in the proximal right V1. No other hemodynamically significant stenosis in the neck. 3. Aortic atherosclerosis and emphysema. Aortic Atherosclerosis (ICD10-I70.0) and Emphysema (ICD10-J43.9). Electronically Signed   By: Wiliam Ke M.D.   On: 10/30/2022 11:41   MR BRAIN WO CONTRAST  Result Date: 10/29/2022 CLINICAL DATA:  Mental status change, unknown cause. EXAM: MRI HEAD WITHOUT CONTRAST TECHNIQUE: Multiplanar, multiecho pulse sequences of the brain and surrounding structures were obtained without intravenous contrast. COMPARISON:  CT head without contrast 10/28/2022 FINDINGS: Brain: Scattered small foci of restricted diffusion present the subcortical white matter bilaterally. The largest lesion is in the left corona radiata to the external capsule where a cylindrical area restricted diffusion is present. The areas in question all demonstrate T2 and FLAIR hyperintensities remote lacunar infarcts are present within the thalami bilaterally. Remote lacunar infarcts are present within the right posterolateral pons extending into the middle cerebellar peduncle. Remote lacunar infarct is present in the left cerebellum. Mild generalized atrophy is present. The ventricles are proportionate to the degree of  atrophy. No significant extraaxial fluid collection is present. Scattered foci of susceptibility are present throughout the white matter both above and below the tentorium. Vascular: Flow is present in the major intracranial arteries. Skull and upper cervical spine: The craniocervical  junction is normal. Upper cervical spine is within normal limits. Marrow signal is unremarkable. Sinuses/Orbits: A left mastoid effusion is present. No obstructing nasopharyngeal lesion is present. Bilateral lens replacements are noted. Globes and orbits are otherwise unremarkable. IMPRESSION: 1. Scattered small foci of restricted diffusion present the subcortical white matter bilaterally. The largest lesion is in the left corona radiata to the external capsule where a cylindrical area of restricted diffusion is present. Findings are consistent with acute/subacute nonhemorrhagic infarcts. This suggests a central embolic source or small vessel microangiopathy. 2. Remote lacunar infarcts of the right posterolateral pons extending into the middle cerebellar peduncle. 3. Remote lacunar infarcts of the thalami bilaterally. 4. Remote lacunar infarct of the left cerebellum. 5. Scattered foci of susceptibility throughout the white matter both above and below the tentorium. This is also consistent with microvascular angiopathy, potentially amyloid angiopathy 6. Left mastoid effusion. No obstructing nasopharyngeal lesion is present. Electronically Signed   By: Marin Roberts M.D.   On: 10/29/2022 16:07   CT ANKLE LEFT WO CONTRAST  Result Date: 10/28/2022 CLINICAL DATA:  Ankle trauma, fracture, xray done (Age >= 5y) EXAM: CT OF THE LEFT ANKLE WITHOUT CONTRAST TECHNIQUE: Multidetector CT imaging of the left ankle was performed according to the standard protocol. Multiplanar CT image reconstructions were also generated. RADIATION DOSE REDUCTION: This exam was performed according to the departmental dose-optimization program which includes automated exposure control, adjustment of the mA and/or kV according to patient size and/or use of iterative reconstruction technique. COMPARISON:  X-ray 10/28/2022 FINDINGS: Bones/Joint/Cartilage Diffuse osseous demineralization. Subacute-appearing healing nondisplaced fracture of  the lateral malleolus (series 4, images 37-39). No medial or posterior malleolar fracture. Ankle mortise is congruent without dislocation. Bones of the hindfoot are intact. Joint spaces are relatively well preserved. No erosion. No bone lesion. Ligaments Suboptimally assessed by CT. Muscles and Tendons No acute musculotendinous abnormality by CT. Soft tissues Soft tissue swelling most pronounced at the lateral aspect of the ankle. No organized hematoma. IMPRESSION: 1. Subacute-appearing healing nondisplaced fracture of the lateral malleolus. 2. Soft tissue swelling most pronounced at the lateral aspect of the ankle. Electronically Signed   By: Duanne Guess D.O.   On: 10/28/2022 16:30   CT HEAD WO CONTRAST ( )  Result Date: 10/28/2022 CLINICAL DATA:  Altered level of consciousness EXAM: CT HEAD WITHOUT CONTRAST TECHNIQUE: Contiguous axial images were obtained from the base of the skull through the vertex without intravenous contrast. RADIATION DOSE REDUCTION: This exam was performed according to the departmental dose-optimization program which includes automated exposure control, adjustment of the mA and/or kV according to patient size and/or use of iterative reconstruction technique. COMPARISON:  03/08/2006 FINDINGS: Brain: Scattered hypodensities are seen throughout the periventricular and subcortical white matter and right-sided pons, consistent with age-indeterminate small vessel ischemic changes. No other signs of acute infarct or hemorrhage. Lateral ventricles and midline structures are unremarkable. No acute extra-axial fluid collections. No mass effect. Vascular: No hyperdense vessel or unexpected calcification. Skull: Normal. Negative for fracture or focal lesion. Sinuses/Orbits: No acute finding. Other: None. IMPRESSION: 1. Age-indeterminate small-vessel ischemic changes throughout the periventricular and subcortical white matter and right-sided pons, favor chronic. 2. No acute hemorrhage.  Electronically Signed   By: Sharlet Salina M.D.   On: 10/28/2022 14:38  DG Ankle Complete Left  Result Date: 10/28/2022 CLINICAL DATA:  Fall with ankle swelling EXAM: LEFT ANKLE COMPLETE - 3 VIEW COMPARISON:  None Available. FINDINGS: Apparent cortical discontinuity along the lateral and posterior aspect of the distal fibular diaphysis. Diffuse osteopenia. Diffuse soft tissue edema about the ankle. IMPRESSION: Apparent cortical discontinuity along the lateral and posterior aspect of the distal fibular diaphysis, which may represent a nondisplaced fracture. Electronically Signed   By: Agustin Cree M.D.   On: 10/28/2022 13:01     Assessment and Plan:   1. PSVT - prior h/o such in setting of physiologic stressors, TSH wnl this admission - occurred 10/30/22 as well, initially improved with IV metoprolol, recurred this AM, asymptomatic - primary team rechecking basic labs - Dr. Royann Shivers attended to consultation with me - patient was given additional 5mg  IV metoprolol with slowing of HR back to the 120s-130s - did not fully break so received 6mg  IV adenosine push by rapid response nurse and converted back to NSR HR 90s - per our discussion, will start back oral metoprolol tartrate in the form of 25mg  TID, hold hydralazine to allow BP room for AVN blocking agents as needed (can utilize PRN if needed), OK to continue losartan for now since already got dose this AM  2. Elevated troponin - relatively low/flat in setting of tachycardia and metabolic demands without angina or wall motion abnormalities on echocardiogram - suspect demand ischemia, do not anticipate further acute workup, can re-evaluate as outpatient  3. Mild-moderate AI - no acute intervention needed, can be followed clinically as OP - consider repeat echo 1 year  4. HTN - manage in context above  Remainder of medical issues per primary team  Risk Assessment/Risk Scores:       For questions or updates, please contact [facility] Please consult [email or web address] for contact info under    Signed, Laurann Montana, PA-C  11/01/2022 9:43 AM  I have seen and examined the patient along with Laurann Montana, PA-C .  I have reviewed the chart, notes and new data.  I agree with PA/NP's note.  Key new complaints: Largely unaware of the arrhythmia, even at 150 bpm. No dyspnea or chest pain Key examination changes: normal CV exam except for tachycardia Key new findings / data: review of monitor shows recurrent short RP supraventricular tachycardia with 1:1 AV association. All episodes appear to consistently start with a long-short sequence and stop with an ectopic beat. However, there are at least two very distinct cycle lengths:  - More commonly she has SVT around 150 bpm with a distinct negative P wave barely emerging from the terminal QRS. - Other times she has a slower SVT around 120 bpm with a more distinct retrograde P wave on the ST segment about 80 ms after the end of the QRS complex. Both morphologies are captured on 12 lead ECGs this morning. Interestingly, administration of IV metoprolol caused a gradual reduction in the tachycardia rate and prolongation of the RP conduction time, which I find hard to explain. Carotid sinus compression on either side had no impact on the tachycardia. Adenosine IV led to prompt resolution of the tachycardia with restoration of sinus rhythm. There is no preexcitation on multiple ECGs during this admission and going back to 2013.  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 Onset late in life, female gender, dominant SVT morphology on ECG all support common AVNRT. But she has a second circuit of SVT, adenosine responsive -  may be slow-slow/uncommon AVNRT, rather than accessory pathway/WPW. Gradual transition from one cycle length and morphology with beta blocker. I am not sure what that means. Regardless, will treat with escalating doses of oral beta blocker until we have  satisfactory control of the arrhythmia (increased burden likely driven by ETOH wd, pain from injury, etc.).  Thurmon Fair, MD, [facility] [phone] 11/01/2022, 10:36 AM

## 2022-11-01 NOTE — Progress Notes (Signed)
Called pt's son to provide update but went to VM. Left HIPAA compliant VM.

## 2022-11-01 NOTE — Progress Notes (Signed)
Received message from RN that patient heart rate above 160.  Of note patient has been in and out of tachycardia this admission. EKG shows SVT at bedside.  Patient is asymptomatic, denies chest pain, no shortness of breath, diaphoresis etc. examination unremarkable patient reports possibility of [employer] syndrome although is unsure.  Instructed RN to give IV metoprolol 5 mg x 3 doses every 10 minutes necessary.  Vital signs stable.  Will call cardiology this morning for consult.

## 2022-11-01 NOTE — Progress Notes (Signed)
   11/01/22 0707  Assess: MEWS Score  Temp 98 F (36.7 C)  BP 125/80  MAP (mmHg) 93  Pulse Rate (!) 167  Resp 18  SpO2 95 %  O2 Device Room Air  Assess: MEWS Score  MEWS Temp 0  MEWS Systolic 0  MEWS Pulse 3  MEWS RR 0  MEWS LOC 0  MEWS Score 3  MEWS Score Color Yellow  Assess: if the MEWS score is Yellow or Red  Were vital signs taken at a resting state? Yes  Focused Assessment No change from prior assessment  Does the patient meet 2 or more of the SIRS criteria? No  MEWS guidelines implemented  Yes, yellow  Treat  MEWS Interventions Considered administering scheduled or prn medications/treatments as ordered  Take Vital Signs  Increase Vital Sign Frequency  Yellow: Q2hr x1, continue Q4hrs until patient remains green for 12hrs  Escalate  MEWS: Escalate Yellow: Discuss with charge nurse and consider notifying provider and/or RRT  Notify: Charge Nurse/RN  Name of Charge Nurse/RN Notified AngelitoRN  Provider Notification  Provider Name/Title Dr Allena Katz  Date Provider Notified 11/01/22  Time Provider Notified [phone]  Method of Notification Page  Notification Reason Other (Comment) (yellow mews, Pt tachy in 160s)  Provider response See new orders;At bedside  Date of Provider Response 11/01/22  Time of Provider Response 0708  Notify: Rapid Response  Name of Rapid Response RN Notified Helle,RN  Date Rapid Response Notified 11/01/22  Time Rapid Response Notified 0707  Assess: SIRS CRITERIA  SIRS Temperature  0  SIRS Pulse 1  SIRS Respirations  0  SIRS WBC 0  SIRS Score Sum  1

## 2022-11-02 DIAGNOSIS — I471 Supraventricular tachycardia, unspecified: Secondary | ICD-10-CM

## 2022-11-02 DIAGNOSIS — S82142A Displaced bicondylar fracture of left tibia, initial encounter for closed fracture: Secondary | ICD-10-CM | POA: Diagnosis not present

## 2022-11-02 DIAGNOSIS — I10 Essential (primary) hypertension: Secondary | ICD-10-CM | POA: Diagnosis not present

## 2022-11-02 LAB — GLUCOSE, CAPILLARY
Glucose-Capillary: 90 mg/dL (ref 70–99)
Glucose-Capillary: 89 mg/dL (ref 70–99)
Glucose-Capillary: 109 mg/dL — ABNORMAL HIGH (ref 70–99)
Glucose-Capillary: 116 mg/dL — ABNORMAL HIGH (ref 70–99)

## 2022-11-02 LAB — CBC
HCT: 34 % — ABNORMAL LOW (ref 36.0–46.0)
Hemoglobin: 11.3 g/dL — ABNORMAL LOW (ref 12.0–15.0)
MCH: 33.4 pg (ref 26.0–34.0)
MCHC: 33.2 g/dL (ref 30.0–36.0)
MCV: 100.6 fL — ABNORMAL HIGH (ref 80.0–100.0)
Platelets: 360 10*3/uL (ref 150–400)
RBC: 3.38 MIL/uL — ABNORMAL LOW (ref 3.87–5.11)
RDW: 12.3 % (ref 11.5–15.5)
WBC: 6.1 10*3/uL (ref 4.0–10.5)
nRBC: 0 % (ref 0.0–0.2)

## 2022-11-02 LAB — BASIC METABOLIC PANEL
Anion gap: 6 (ref 5–15)
BUN: 13 mg/dL (ref 8–23)
CO2: 26 mmol/L (ref 22–32)
Calcium: 8.2 mg/dL — ABNORMAL LOW (ref 8.9–10.3)
Chloride: 99 mmol/L (ref 98–111)
Creatinine, Ser: 0.55 mg/dL (ref 0.44–1.00)
GFR, Estimated: >60 mL/min (ref >60)
Glucose, Bld: 99 mg/dL (ref 70–99)
Potassium: 3.8 mmol/L (ref 3.5–5.1)
Sodium: 131 mmol/L — ABNORMAL LOW (ref 135–145)

## 2022-11-02 LAB — VITAMIN B1: Vitamin B1 (Thiamine): 154.4 nmol/L (ref 66.5–200.0)

## 2022-11-02 MED ORDER — LOSARTAN POTASSIUM 50 MG PO TABS
50.0000 mg | ORAL_TABLET | Freq: Every day | ORAL | Status: DC
  Administered 2022-11-03 – 2022-11-05 (×3): 50 mg via ORAL
  Filled 2022-11-02 (×3): qty 1

## 2022-11-02 NOTE — Progress Notes (Signed)
PROGRESS NOTE   Erica Ball  QIO:[id number] DOB: 1950/07/15 DOA: 10/23/2022 PCP: Norm Salt, PA   Date of Service: the patient was seen and examined on 11/02/2022  Brief Narrative:  Erica Ball is a 72 y.o. female with a history of alcohol abuse, tobacco abuse, hypertension, COPD.  Patient presented secondary to a fall and subsequent left leg pain and found to have a left tibial plateau fracture.  Orthopedic surgery was consulted and recommended nonoperative management with application of left knee immobilizer and nonweightbearing to the right lower extremity.  Patient evaluated by PT/OT with recommendation for SNF.  Patient developed progressively worsening lethargy prior to discharge. MRI on 6/24 revealed evidence of acute/subacute stroke.    Assessment and Plan:  SVT Started having SVT on 6/27, treated with 2 doses of metoprolol and adenosine given by cardiology. HR over night in the 70s. No further episodes of SVT. Telemetry shows SR. -Continuous  telemetry, now on progressive unit -Cardiology following -Continue metoprolol 25 mg 3 times daily -Stat CBC, BMP and magnesium -D/c IV fluids   Tibial plateau fracture, left, closed  Secondary to fall. Orthopedic surgery was consulted and they were recommending conservative management with left knee immobilizer and NWB on left, patient will be weightbearing as tolerated on right. -Continue with pain management -PT/OT recommending SNF at discharge -[facility] consult  Left ankle fracture Noted on CT imaging and likely related to recent fall. -Orthopedic surgery recommendations: NWB left leg  Encephalopathy Stable Initially thought related to Librium. Progressively worsened and now persistent. Librium last given on 6/20. CT head unremarkable. No fevers. TSH normal. No hypoglycemia. Vitamin B12 normal. CRP and sed rate mildly elevated. This seems likely to be metabolic in etiology, but unsure of exact etiology. ABG on 6/24  with compensated mild hypercapnia.  EEG with right hemispheric slowing, no seizure. -Continue to monitor  Subacute stroke, incidental finding CT head chronic right pontine, b/l periventricular WM and subcortical infarcts. 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 CTA head and neck no intracranial LVO, mild stenosis right V1, no other hemodynamically significant stenosis in the neck LDL of 62. 2D Echo EF 60-65% -Neurology signed off -Aspirin 81 mg, no DAPT given stroke is subacute -Follow-up in stroke clinic in 4 weeks  Hyperlipidemia -Continue Crestor 10 mg  Hyponatremia Na 133 today. Likely secondary to heavy alcohol use. Hyponatremia lab labs with hypotonic hyponatremia with low serum and urine osmolality.  Urinary sodium at 15.  TSH normal.  -Monitor sodium and if no improvement then she might need salt tablets  Alcohol abuse Patient drinks heavily daily.  Denies any prior withdrawals or seizures. -Counseling was provided-does not have much desire to quit -CIWA protocol -Continue multivitamin, thiamine, folic acid as able   Hypertension BP elevated to 199/103.  Patient is asymptomatic.  -Continue metoprolol 25 mg 3 times daily -Starting losartan per cardiology -Consider starting amlodipine if no improvement in blood pressure over the next few days -Establish with PCP as outpatient  COPD GOLD 0 / still smoking  Stable  -Continue with as needed bronchodilator  Tobacco abuse Counseling was provided. -Patient apparently declined nicotine patch  Subjective:  Doing better this morning. No acute concerns.   Physical Exam:  Vitals:   11/02/22 0034 11/02/22 0041 11/02/22 0341 11/02/22 0719  BP: (!) 178/88 (!) 148/84 Marland Kitchen)  199/102 (!)  195/103  Pulse: 73  74 76  Resp: 11  14 18   Temp: (!) 97.4 F (36.3 C)  (!) 97.5 F (36.4 C) 97.8 F (36.6 C)  TempSrc: Oral  Oral Oral  SpO2: 98% 98% 98% 98%  Weight:      Height:        General: Alert, no acute distress, frail appearing, sitting up in chair  Cardio: Normal S1 and S2, RRR, no r/m/g Pulm: CTAB, normal work of breathing Abdomen: Bowel sounds normal. Abdomen soft and non-tender.  Extremities: No peripheral edema.  Neuro: Cranial nerves grossly intact   Data Reviewed:  I have personally reviewed and interpreted labs, imaging.   CBC: Recent Labs  Lab 10/29/22 1325 10/31/22 0959 11/01/22 0907 11/02/22 0126  WBC 6.9 8.1 7.1 6.1  HGB 13.3 12.7 12.8 11.3*  HCT 38.9 37.7 37.6 34.0*  MCV 98.7 99.5 101.9* 100.6*  PLT 322 351 365 360    Basic Metabolic Panel: Recent Labs  Lab 10/29/22 0746 10/30/22 0725 10/31/22 0704 10/31/22 0959 11/01/22 0907 11/02/22 0126  NA 130* 133*  --  134* 133* 131*  K 3.8 4.2  --  3.8 3.7 3.8  CL 94* 96*  --  96* 96* 99  CO2 27 25  --  27 29 26   GLUCOSE 127* 126*  --  116* 107* 99  BUN 11 17  --  12 14 13   CREATININE 0.53 0.53 0.52 0.38* 0.45 0.55  CALCIUM 9.0 8.8*  --  8.8* 8.7* 8.2*  MG  --  1.7  --  1.7 1.8  --     GFR: Estimated Creatinine Clearance: 62.8 mL/min (by C-G formula based on SCr of 0.55 mg/dL). Liver Function Tests: No results for input(s): "AST", "ALT", "ALKPHOS", "BILITOT", "PROT", "ALBUMIN" in the last 168 hours.  Coagulation Profile: No results for input(s): "INR", "PROTIME" in the last 168 hours.  Code Status:  Full code.  Code status decision has been confirmed with: patient  Family Communication:   Severity of Illness:  The appropriate patient status for this patient is INPATIENT. Inpatient status is judged to be reasonable and necessary in order to provide the required intensity of service to ensure the patient's safety. The patient's presenting symptoms, physical exam findings, and  initial radiographic and laboratory data in the context of their chronic comorbidities is felt to place them at high risk for further clinical deterioration. Furthermore, it is not anticipated that the patient will be medically stable for discharge from the hospital within 2 midnights of admission.   * I certify that at the point of admission it is my clinical judgment that the patient will require inpatient hospital care spanning beyond 2 midnights from the point of admission due to high intensity of service, high risk for further deterioration and high frequency of surveillance required.*   Author:  Rolm Gala MD  11/02/2022 8:38 AM

## 2022-11-02 NOTE — Progress Notes (Signed)
   Patient Name: Erica Ball Date of Encounter: 11/02/2022 [facility] Cardiologist: Thurmon Fair, MD   Interval Summary  .    Patient awake and alert this morning, eating breakfast. Denies symptoms of palpitations, racing heart beat, chest pain/tightness, shortness of breath. Overall feels well this morning. We discussed that she has not had recurrence of tachycardia since yesterday morning, patient visibly relieved to hear this.  Vital Signs .    Vitals:   11/02/22 0034 11/02/22 0041 11/02/22 0341 11/02/22 0719  BP: (!) 178/88 (!) 148/84 (!) 199/102 (!) 195/103  Pulse: 73  74 76  Resp: 11  14 18   Temp: (!) 97.4 F (36.3 C)  (!) 97.5 F (36.4 C) 97.8 F (36.6 C)  TempSrc: Oral  Oral Oral  SpO2: 98% 98% 98% 98%  Weight:      Height:       No intake or output data in the 24 hours ending 11/02/22 0824    10/24/2022   12:30 AM 12/12/2017    9:58 AM 12/06/2017    9:19 AM  Last 3 Weights  Weight (lbs) 140 lb 140 lb 140 lb 8 oz  Weight (kg) 63.504 kg 63.504 kg 63.73 kg      Telemetry/ECG    Sinus rhythm, HR 70s-80s - Personally Reviewed  Physical Exam .   GEN: No acute distress.   Neck: No JVD Cardiac: RRR, no murmurs, rubs, or gallops.  Respiratory: Clear to auscultation bilaterally. GI: Soft, nontender, non-distended  MS: No edema  Assessment & Plan .     Paroxsymal Supraventricular Tachycardia  Patient with hx of SVT has had multiple episodes of tachycardia into the 150s during admission due to fall with fractures. Recurrent episode yesterday prompted cardiology consult. Patient with limited response to IV metoprolol received 6mg  Adenosine and converted back to NSR.   Telemetry without any evidence of recurrent SVT since yesterday morning. Episodes likely triggered by ETOH withdrawal, pain, etc. Continue Metoprolol Tartrate 25mg  TID. Would consider consolidation at discharge.    Hypertension  Patient with increased BP this morning, up to 199/102  mmHg. Hydralazine was held yesterday to allow increased beta blocker dose.   Will increase Losartan from 25mg  to 50mg  today, prefer this to Hydralazine longterm for BP management. She is likely to require further titration to 100mg  or perhaps more potent ARB such as Irbesartan.  Continue PRN hydralazine pushes for breakthrough hypertension  Elevated troponin  Patient found with troponin 45->126->134. Given known tachycardic arrhythmia episodes and TTE without WMA or cardiomyopathy, troponin leak not consistent with ACS, likely demand ischemia.   Mild to moderate Aortic Regurgitation  TTE showed mild to moderate aortic valve regurgitation. Patient will need outpatient follow up for monitoring of progression.    Per primary team:  Tibial fracture Ankle fracture Encephalopathy Subacute stroke Alcohol abuse COPD  For questions or updates, please contact [facility] Please consult [email or web address] for contact info under        Signed, Perlie Gold, PA-C

## 2022-11-02 NOTE — Progress Notes (Signed)
Physical Therapy Treatment Patient Details Name: Erica Ball MRN: [id number] DOB: 07/26/1950 Today's Date: 11/02/2022   History of Present Illness 72 year old female admitted 6/18 after fall, founmd to have left tibial plateau fracture and right metatarsal fracture; CT r/o metatarsal fractures on Rt 6/19. PMHx: alcohol abuse, tobacco abuse, HTN, COPD, mobility issues    PT Comments    Pt received in supine and agreeable to session. Pt demonstrating improved ability to perform bed mobility with min A for LLE management. Pt requiring increased time and dense cues for all mobility tasks. Pt attempting to stand from EOB x2, however demonstrates difficulty coordinating use of RW for support despite cues. Pt asking why she cannot WB on LLE and was provided education on importance of maintaining WB precautions. Pt instructed in lateral scoot transfer to the recliner with pt able to demonstrate slow, small scoots. Pt with difficulty following commands for head-hips relationship and ultimately requiring mod A +2 to complete transfer. Pt continues to benefit from PT services to progress toward functional mobility goals.     Recommendations for follow up therapy are one component of a multi-disciplinary discharge planning process, led by the attending physician.  Recommendations may be updated based on patient status, additional functional criteria and insurance authorization.     Assistance Recommended at Discharge Frequent or constant Supervision/Assistance  Patient can return home with the following Two people to help with walking and/or transfers;Two people to help with bathing/dressing/bathroom;Assist for transportation;Help with stairs or ramp for entrance   Equipment Recommendations  Other (comment)    Recommendations for Other Services       Precautions / Restrictions Precautions Precautions: Fall Required Braces or Orthoses: Knee Immobilizer - Left Restrictions Weight Bearing  Restrictions: Yes RLE Weight Bearing: Weight bearing as tolerated LLE Weight Bearing: Non weight bearing     Mobility  Bed Mobility Overal bed mobility: Needs Assistance Bed Mobility: Supine to Sit     Supine to sit: Min assist, HOB elevated     General bed mobility comments: Min A for LLE management and use of bedpad to scoot forward at EOB    Transfers Overall transfer level: Needs assistance Equipment used: Rolling walker (2 wheels) Transfers: Sit to/from Stand, Bed to chair/wheelchair/BSC Sit to Stand: Max assist, +2 physical assistance          Lateral/Scoot Transfers: Mod assist, +2 physical assistance General transfer comment: Attempted to stand from elevated EOB x2, but pt unable to reach an upright posture. Pt leaning over RW with flexed trunk and unable to coordinate pushing down into the RW for support despite cues. Therapist's foot remained under LLE to maintain NWB. Pt able to scoot towards recliner with increased time, however demonstrates difficulty with forward lean despite cues requiring mod A +2 to complete transfer.    Ambulation/Gait               General Gait Details: unable       Balance Overall balance assessment: Needs assistance, History of Falls Sitting-balance support: No upper extremity supported, Feet supported Sitting balance-Leahy Scale: Fair Sitting balance - Comments: sitting EOB with flexed trunk, but no LOB   Standing balance support: During functional activity, Bilateral upper extremity supported, Reliant on assistive device for balance Standing balance-Leahy Scale: Zero Standing balance comment: Reliant on RW and outside assist to maintain standing  Cognition Arousal/Alertness: Awake/alert Behavior During Therapy: Flat affect Overall Cognitive Status: No family/caregiver present to determine baseline cognitive functioning                                 General Comments:  Pt alert and able to follow commands        Exercises      General Comments        Pertinent Vitals/Pain Pain Assessment Pain Assessment: No/denies pain     PT Goals (current goals can now be found in the care plan section) Acute Rehab PT Goals Patient Stated Goal: Get better PT Goal Formulation: With patient Time For Goal Achievement: 11/07/22 Potential to Achieve Goals: Fair Progress towards PT goals: Progressing toward goals    Frequency    Min 3X/week      PT Plan Current plan remains appropriate       AM-PAC PT "6 Clicks" Mobility   Outcome Measure  Help needed turning from your back to your side while in a flat bed without using bedrails?: A Little Help needed moving from lying on your back to sitting on the side of a flat bed without using bedrails?: A Little Help needed moving to and from a bed to a chair (including a wheelchair)?: A Lot Help needed standing up from a chair using your arms (e.g., wheelchair or bedside chair)?: Total Help needed to walk in hospital room?: Total Help needed climbing 3-5 steps with a railing? : Total 6 Click Score: 11    End of Session Equipment Utilized During Treatment: Gait belt;Left knee immobilizer Activity Tolerance: Patient tolerated treatment well;Patient limited by fatigue Patient left: with call bell/phone within reach;in chair;with chair alarm set Nurse Communication: Mobility status;Other (comment) (technique to return to bed) PT Visit Diagnosis: Unsteadiness on feet (R26.81);Repeated falls (R29.6);Muscle weakness (generalized) (M62.81);History of falling (Z91.81);Difficulty in walking, not elsewhere classified (R26.2);Pain     Time: 1031-1059 PT Time Calculation (min) (ACUTE ONLY): 28 min  Charges:  $Therapeutic Activity: 23-37 mins                     Johny Shock, PTA Acute Rehabilitation Services Secure Chat Preferred  Office:([phone]    Johny Shock 11/02/2022, 11:16 AM

## 2022-11-02 NOTE — Progress Notes (Signed)
Physical Therapy Treatment Patient Details Name: Erica Ball MRN: [id number] DOB: July 21, 1950 Today's Date: 11/02/2022   History of Present Illness 72 year old female admitted 6/18 after fall, founmd to have left tibial plateau fracture and right metatarsal fracture; CT r/o metatarsal fractures on Rt 6/19. PMHx: alcohol abuse, tobacco abuse, HTN, COPD, mobility issues    PT Comments    Pt received sitting in the chair and nursing asking for assist returning her to bed for use of the bedpan. Pt able to lateral scoot from the recliner to EOB with mod A +2 with the bedpad due to slightly higher EOB. Pt requiring increased assist for BLE elevation to EOB to return to supine. Pt able to roll with min A to place the bedpan. Pt continues to benefit from PT services to progress toward functional mobility goals.     Recommendations for follow up therapy are one component of a multi-disciplinary discharge planning process, led by the attending physician.  Recommendations may be updated based on patient status, additional functional criteria and insurance authorization.     Assistance Recommended at Discharge Frequent or constant Supervision/Assistance  Patient can return home with the following Two people to help with walking and/or transfers;Two people to help with bathing/dressing/bathroom;Assist for transportation;Help with stairs or ramp for entrance   Equipment Recommendations       Recommendations for Other Services       Precautions / Restrictions Precautions Precautions: Fall Required Braces or Orthoses: Knee Immobilizer - Left Restrictions Weight Bearing Restrictions: Yes RLE Weight Bearing: Weight bearing as tolerated LLE Weight Bearing: Non weight bearing     Mobility  Bed Mobility Overal bed mobility: Needs Assistance Bed Mobility: Sit to Supine, Rolling Rolling: Min assist   Supine to sit: Min assist, HOB elevated Sit to supine: Mod assist, +2 for physical  assistance   General bed mobility comments: Mod A +2 for trunk descent and BLE elevation to EOB    Transfers Overall transfer level: Needs assistance Equipment used: Rolling walker (2 wheels) Transfers: Bed to chair/wheelchair/BSC Sit to Stand: Max assist, +2 physical assistance          Lateral/Scoot Transfers: Mod assist, +2 physical assistance General transfer comment: Pt requiring mod A +2 with the bedpad to lateral scoot from the recliner to the EOB due to the slightly elevated surface.    Ambulation/Gait               General Gait Details: unable       Balance Overall balance assessment: Needs assistance, History of Falls Sitting-balance support: No upper extremity supported, Feet supported Sitting balance-Leahy Scale: Fair Sitting balance - Comments: sitting EOB with flexed trunk, but no LOB   Standing balance support: During functional activity, Bilateral upper extremity supported, Reliant on assistive device for balance Standing balance-Leahy Scale: Zero Standing balance comment: Reliant on RW and outside assist to maintain standing                            Cognition Arousal/Alertness: Awake/alert Behavior During Therapy: Flat affect Overall Cognitive Status: No family/caregiver present to determine baseline cognitive functioning                                 General Comments: Pt alert and able to follow commands        Exercises      General Comments  Pertinent Vitals/Pain Pain Assessment Pain Assessment: Faces Faces Pain Scale: Hurts little more Pain Location: LLE with mobility Pain Descriptors / Indicators: Discomfort, Grimacing Pain Intervention(s): Monitored during session, Repositioned     PT Goals (current goals can now be found in the care plan section) Acute Rehab PT Goals Patient Stated Goal: Get better PT Goal Formulation: With patient Time For Goal Achievement: 11/07/22 Potential to  Achieve Goals: Fair Progress towards PT goals: Progressing toward goals    Frequency    Min 3X/week      PT Plan Current plan remains appropriate       AM-PAC PT "6 Clicks" Mobility   Outcome Measure  Help needed turning from your back to your side while in a flat bed without using bedrails?: A Little Help needed moving from lying on your back to sitting on the side of a flat bed without using bedrails?: A Little Help needed moving to and from a bed to a chair (including a wheelchair)?: Total Help needed standing up from a chair using your arms (e.g., wheelchair or bedside chair)?: Total Help needed to walk in hospital room?: Total Help needed climbing 3-5 steps with a railing? : Total 6 Click Score: 10    End of Session Equipment Utilized During Treatment: Gait belt;Left knee immobilizer Activity Tolerance: Patient tolerated treatment well;Patient limited by fatigue Patient left: with call bell/phone within reach;in bed;with family/visitor present Nurse Communication: Mobility status PT Visit Diagnosis: Unsteadiness on feet (R26.81);Repeated falls (R29.6);Muscle weakness (generalized) (M62.81);History of falling (Z91.81);Difficulty in walking, not elsewhere classified (R26.2);Pain     Time: 1610-9604 PT Time Calculation (min) (ACUTE ONLY): 9 min  Charges:  $Therapeutic Activity: 8-22 mins                     Johny Shock, PTA Acute Rehabilitation Services Secure Chat Preferred  Office:([phone]    Johny Shock 11/02/2022, 4:44 PM

## 2022-11-03 DIAGNOSIS — I2489 Other forms of acute ischemic heart disease: Secondary | ICD-10-CM | POA: Diagnosis not present

## 2022-11-03 DIAGNOSIS — I471 Supraventricular tachycardia, unspecified: Secondary | ICD-10-CM | POA: Diagnosis not present

## 2022-11-03 LAB — GLUCOSE, CAPILLARY
Glucose-Capillary: 113 mg/dL — ABNORMAL HIGH (ref 70–99)
Glucose-Capillary: 82 mg/dL (ref 70–99)
Glucose-Capillary: 142 mg/dL — ABNORMAL HIGH (ref 70–99)
Glucose-Capillary: 136 mg/dL — ABNORMAL HIGH (ref 70–99)

## 2022-11-03 LAB — BASIC METABOLIC PANEL
Anion gap: 10 (ref 5–15)
BUN: 10 mg/dL (ref 8–23)
CO2: 26 mmol/L (ref 22–32)
Calcium: 8.5 mg/dL — ABNORMAL LOW (ref 8.9–10.3)
Chloride: 94 mmol/L — ABNORMAL LOW (ref 98–111)
Creatinine, Ser: 0.53 mg/dL (ref 0.44–1.00)
GFR, Estimated: >60 mL/min (ref >60)
Glucose, Bld: 99 mg/dL (ref 70–99)
Potassium: 3.9 mmol/L (ref 3.5–5.1)
Sodium: 130 mmol/L — ABNORMAL LOW (ref 135–145)

## 2022-11-03 LAB — CBC
HCT: 33.1 % — ABNORMAL LOW (ref 36.0–46.0)
Hemoglobin: 11.1 g/dL — ABNORMAL LOW (ref 12.0–15.0)
MCH: 33.8 pg (ref 26.0–34.0)
MCHC: 33.5 g/dL (ref 30.0–36.0)
MCV: 100.9 fL — ABNORMAL HIGH (ref 80.0–100.0)
Platelets: 341 10*3/uL (ref 150–400)
RBC: 3.28 MIL/uL — ABNORMAL LOW (ref 3.87–5.11)
RDW: 12.1 % (ref 11.5–15.5)
WBC: 5.6 10*3/uL (ref 4.0–10.5)
nRBC: 0 % (ref 0.0–0.2)

## 2022-11-03 LAB — URINALYSIS, ROUTINE W REFLEX MICROSCOPIC
Bilirubin Urine: NEGATIVE
Glucose, UA: NEGATIVE mg/dL
Ketones, ur: NEGATIVE mg/dL
Nitrite: NEGATIVE
Protein, ur: NEGATIVE mg/dL
Specific Gravity, Urine: 1.005 (ref 1.005–1.030)
pH: 7 (ref 5.0–8.0)

## 2022-11-03 MED ORDER — METOPROLOL SUCCINATE ER 100 MG PO TB24
100.0000 mg | ORAL_TABLET | Freq: Every day | ORAL | Status: DC
  Administered 2022-11-03 – 2022-11-06 (×4): 100 mg via ORAL
  Filled 2022-11-03 (×4): qty 1

## 2022-11-03 MED ORDER — CEPHALEXIN 500 MG PO CAPS
500.0000 mg | ORAL_CAPSULE | Freq: Two times a day (BID) | ORAL | Status: DC
  Administered 2022-11-03 – 2022-11-06 (×7): 500 mg via ORAL
  Filled 2022-11-03 (×7): qty 1

## 2022-11-03 NOTE — Progress Notes (Signed)
Provided family an update at bedside.

## 2022-11-03 NOTE — Progress Notes (Signed)
I have sent a message to our office's scheduling team requesting a follow-up appointment in 6-8 weeks, and our office will call the patient with this information. Outlined FYI on AVS for patient to make sure to clarify with scheduler the location her follow-up will be scheduled at, since she was remotely seen by Dr. Eden Emms but was newly established again this admission with Dr. Royann Shivers.

## 2022-11-03 NOTE — Progress Notes (Signed)
Cardiology Progress Note  Patient ID: Erica Ball MRN: [id number] DOB: April 07, 1951 Date of Encounter: 11/03/2022  Primary Cardiologist: Thurmon Fair, MD  Subjective   Chief Complaint: Weakness/fatigue   HPI: No further SVT.  Denies any chest pain or trouble breathing.  ROS:  All other ROS reviewed and negative. Pertinent positives noted in the HPI.     Inpatient Medications  Scheduled Meds:  aspirin  81 mg Oral Daily   enoxaparin (LOVENOX) injection  40 mg Subcutaneous Q24H   folic acid  1 mg Oral Daily   losartan  50 mg Oral Daily   metoprolol tartrate  25 mg Oral TID   multivitamin with minerals  1 tablet Oral Daily   nicotine  14 mg Transdermal Daily   rosuvastatin  10 mg Oral Daily   thiamine  100 mg Oral Daily   Or   thiamine  100 mg Intravenous Daily   Continuous Infusions:  PRN Meds: food thickener, ipratropium-albuterol, metoprolol tartrate, morphine injection, ondansetron **OR** ondansetron (ZOFRAN) IV, senna-docusate   Vital Signs   Vitals:   11/02/22 2341 11/03/22 0403 11/03/22 0749 11/03/22 1036  BP: (!) 152/72 (!) 171/66 (!) 193/73 (!) 187/91  Pulse:  62 68   Resp: 13 15 13 20   Temp: 98.1 F (36.7 C) 97.6 F (36.4 C) 98 F (36.7 C)   TempSrc: Axillary Axillary Axillary   SpO2: 98% 97% 96%   Weight:      Height:        Intake/Output Summary (Last 24 hours) at 11/03/2022 1039 Last data filed at 11/03/2022 0403 Gross per 24 hour  Intake 3655.44 ml  Output 600 ml  Net 3055.44 ml      10/24/2022   12:30 AM 12/12/2017    9:58 AM 12/06/2017    9:19 AM  Last 3 Weights  Weight (lbs) 140 lb 140 lb 140 lb 8 oz  Weight (kg) 63.504 kg 63.504 kg 63.73 kg      Telemetry  Overnight telemetry shows SR 70s, which I personally reviewed.   Physical Exam   Vitals:   11/02/22 2341 11/03/22 0403 11/03/22 0749 11/03/22 1036  BP: (!) 152/72 (!) 171/66 (!) 193/73 (!) 187/91  Pulse:  62 68   Resp: 13 15 13 20   Temp: 98.1 F (36.7 C) 97.6 F (36.4 C)  98 F (36.7 C)   TempSrc: Axillary Axillary Axillary   SpO2: 98% 97% 96%   Weight:      Height:        Intake/Output Summary (Last 24 hours) at 11/03/2022 1039 Last data filed at 11/03/2022 0403 Gross per 24 hour  Intake 3655.44 ml  Output 600 ml  Net 3055.44 ml       10/24/2022   12:30 AM 12/12/2017    9:58 AM 12/06/2017    9:19 AM  Last 3 Weights  Weight (lbs) 140 lb 140 lb 140 lb 8 oz  Weight (kg) 63.504 kg 63.504 kg 63.73 kg    Body mass index is 21.29 kg/m.  General: Well nourished, well developed, in no acute distress Head: Atraumatic, normal size  Eyes: PEERLA, EOMI  Neck: Supple, no JVD Endocrine: No thryomegaly Cardiac: Normal S1, S2; RRR; no murmurs, rubs, or gallops Lungs: Clear to auscultation bilaterally, no wheezing, rhonchi or rales  Abd: Soft, nontender, no hepatomegaly  Ext: Left ankle in brace, trace edema Skin: Warm and dry, no rashes   Neuro: Alert and oriented to person, place, time, and situation, CNII-XII grossly intact, no focal  deficits   Labs  High Sensitivity Troponin:   Recent Labs  Lab 10/30/22 0725 10/30/22 1133 10/30/22 1806  TROPONINIHS 45* 126* 134*     Cardiac EnzymesNo results for input(s): "TROPONINI" in the last 168 hours. No results for input(s): "TROPIPOC" in the last 168 hours.  Chemistry Recent Labs  Lab 11/01/22 0907 11/02/22 0126 11/03/22 0118  NA 133* 131* 130*  K 3.7 3.8 3.9  CL 96* 99 94*  CO2 29 26 26   GLUCOSE 107* 99 99  BUN 14 13 10   CREATININE 0.45 0.55 0.53  CALCIUM 8.7* 8.2* 8.5*  GFRNONAA >60 >60 >60  ANIONGAP 8 6 10     Hematology Recent Labs  Lab 11/01/22 0907 11/02/22 0126 11/03/22 0118  WBC 7.1 6.1 5.6  RBC 3.69* 3.38* 3.28*  HGB 12.8 11.3* 11.1*  HCT 37.6 34.0* 33.1*  MCV 101.9* 100.6* 100.9*  MCH 34.7* 33.4 33.8  MCHC 34.0 33.2 33.5  RDW 12.4 12.3 12.1  PLT 365 360 341   BNPNo results for input(s): "BNP", "PROBNP" in the last 168 hours.  DDimer No results for input(s): "DDIMER" in the  last 168 hours.   Radiology  No results found.  Cardiac Studies  TTE 10/30/2022  1. Left ventricular ejection fraction, by estimation, is 60 to 65%. The  left ventricle has normal function. The left ventricle has no regional  wall motion abnormalities. Left ventricular diastolic parameters are  consistent with Grade I diastolic  dysfunction (impaired relaxation).   2. Right ventricular systolic function is normal. The right ventricular  size is normal.   3. Left atrial size was mildly dilated.   4. The mitral valve is normal in structure. No evidence of mitral valve  regurgitation. No evidence of mitral stenosis.   5. The aortic valve is normal in structure. Aortic valve regurgitation is  mild to moderate. No aortic stenosis is present.   6. The inferior vena cava is normal in size with greater than 50%  respiratory variability, suggesting right atrial pressure of 3 mmHg.   Patient Profile  Erica Ball is a 72 y.o. female with alcohol abuse, tobacco abuse, hypertension, COPD who was admitted on 10/23/2022 with fall and left foot/ankle fracture.  Course complicated by stroke and encephalopathy.  Cardiology consulted for SVT.  Assessment & Plan   # SVT -No recurrence.  Transition metoprolol succinate 100 mg daily.  Echo normal.  -We will arrange outpatient follow-up.  # Elevated troponin, demand -Does not represent an acute coronary syndrome.  Echo normal.  Can have this addressed as an outpatient. -Continue aspirin.  Add statin as you are able.  [facility] will sign off.   Medication Recommendations: As above Other recommendations (labs, testing, etc): None Follow up as an outpatient: 68 weeks with Dr. Royann Shivers  For questions or updates, please contact [facility] Please consult [email or web address] for contact info under        Signed, Gerri Spore T. Flora Lipps, MD, [facility]  [facility]  11/03/2022 10:39 AM

## 2022-11-03 NOTE — Progress Notes (Addendum)
PROGRESS NOTE   Erica Ball  ZOX:[id number] DOB: [birth year] DOA: 10/23/2022 PCP: Norm Salt, PA   Date of Service: the patient was seen and examined on 11/03/2022  Brief Narrative:  Erica Ball is a 72 y.o. female with a history of alcohol abuse, tobacco abuse, hypertension, COPD.  Patient presented secondary to a fall and subsequent left leg pain and found to have a left tibial plateau fracture.  Orthopedic surgery was consulted and recommended nonoperative management with application of left knee immobilizer and nonweightbearing to the right lower extremity.  Patient evaluated by PT/OT with recommendation for SNF.  Patient developed progressively worsening lethargy prior to discharge. MRI on 6/24 revealed evidence of acute/subacute stroke.    Assessment and Plan:  SVT No further episodes of SVT. Telemetry shows SR. -Continuous  telemetry -Cardiology following -Continue metoprolol  tartrate 100mg  daily  Elevated troponin, demand Per cardiology does not represent an acute coronary syndrome.  Echo normal.  Can have this addressed as an outpatient. -Continue aspirin and statin   Severe hypertension BP elevated to 176/88.  Patient is asymptomatic.  -Continue metoprolol  tartrate 100mg  daily -Starting losartan 50mg  per cardiology, titrate dose up as able -Consider starting amlodipine if no improvement in blood pressure over the next few days -Establish with PCP as outpatient  Tibial plateau fracture, left, closed  Secondary to fall. Orthopedic surgery was consulted and they were recommending conservative management with left knee immobilizer and NWB on left, patient will be weightbearing as tolerated on right. -Continue with pain management -PT/OT recommending SNF at discharge, aim for d/c on 6/30  -TOC consult  Left ankle fracture Noted on CT imaging and likely related to recent fall. -Orthopedic surgery recommendations: NWB left leg  Encephalopathy Stable,  seems to be at baseline Initially thought related to Librium. Progressively worsened and now persistent. Librium last given on 6/20. CT head unremarkable. No fevers. TSH normal. No hypoglycemia. Vitamin B12 normal. CRP and sed rate mildly elevated. This seems likely to be metabolic in etiology, but unsure of exact etiology. ABG on 6/24 with compensated mild hypercapnia.  EEG with right hemispheric slowing, no seizure. -Continue to monitor  Subacute stroke, incidental finding CT head chronic right pontine, b/l periventricular WM and subcortical infarcts. 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 CTA head and neck no intracranial LVO, mild stenosis right V1, no other hemodynamically significant stenosis in the neck LDL of 62. 2D Echo EF 60-65% -Neurology signed off -Aspirin 81 mg, no DAPT given stroke is subacute -Follow-up in stroke clinic in 4 weeks  Hyperlipidemia -Continue Crestor 10 mg  Hyponatremia Na 130 today. Likely secondary to heavy alcohol use. Hyponatremia lab labs with hypotonic hyponatremia with low serum and urine osmolality.  Urinary sodium at 15.  TSH normal.  -Monitor sodium and if no improvement then she might need salt tablets  Alcohol abuse Patient drinks heavily daily.  Denies any prior withdrawals or seizures. -Counseling was provided-does not have much desire to quit -CIWA protocol -Continue multivitamin, thiamine, folic acid as able   COPD GOLD 0 / still smoking  Stable  -Continue with as needed bronchodilator  Tobacco abuse Counseling was provided. -Patient apparently declined nicotine patch  Subjective:  No acute concerns today.   Physical Exam:  Vitals:   11/02/22 2240 11/02/22 2341 11/03/22 0403 11/03/22  0749  BP: 128/69 (!) 152/72 (!) 171/66 (!) 193/73  Pulse: 60  62 68  Resp: 16 13 15 13   Temp:  98.1 F (36.7 C) 97.6 F (36.4 C) 98 F (36.7 C)  TempSrc:  Axillary Axillary Axillary  SpO2:  98% 97% 96%  Weight:      Height:        General: Alert, no acute distress, frail appearing, sitting up in chair Cardio: Normal S1 and S2, RRR, no r/m/g Pulm: CTAB, normal work of breathing Extremities: No peripheral edema.  Neuro: Cranial nerves grossly intact   Data Reviewed:  I have personally reviewed and interpreted labs, imaging.   CBC: Recent Labs  Lab 10/29/22 1325 10/31/22 0959 11/01/22 0907 11/02/22 0126 11/03/22 0118  WBC 6.9 8.1 7.1 6.1 5.6  HGB 13.3 12.7 12.8 11.3* 11.1*  HCT 38.9 37.7 37.6 34.0* 33.1*  MCV 98.7 99.5 101.9* 100.6* 100.9*  PLT 322 351 365 360 341    Basic Metabolic Panel: Recent Labs  Lab 10/30/22 0725 10/31/22 0704 10/31/22 0959 11/01/22 0907 11/02/22 0126 11/03/22 0118  NA 133*  --  134* 133* 131* 130*  K 4.2  --  3.8 3.7 3.8 3.9  CL 96*  --  96* 96* 99 94*  CO2 25  --  27 29 26 26   GLUCOSE 126*  --  116* 107* 99 99  BUN 17  --  12 14 13 10   CREATININE 0.53 0.52 1.61* 0.45 0.55 0.53  CALCIUM 8.8*  --  8.8* 8.7* 8.2* 8.5*  MG 1.7  --  1.7 1.8  --   --     GFR: Estimated Creatinine Clearance: 62.8 mL/min (by C-G formula based on SCr of 0.53 mg/dL). Liver Function Tests: No results for input(s): "AST", "ALT", "ALKPHOS", "BILITOT", "PROT", "ALBUMIN" in the last 168 hours.  Coagulation Profile: No results for input(s): "INR", "PROTIME" in the last 168 hours.  Code Status:  Full code.  Code status decision has been confirmed with: patient  Family Communication:   Severity of Illness:  The appropriate patient status for this patient is INPATIENT. Inpatient status is judged to be reasonable and necessary in order to provide the required intensity of service to ensure the patient's safety. The patient's presenting symptoms, physical  exam findings, and initial radiographic and laboratory data in the context of their chronic comorbidities is felt to place them at high risk for further clinical deterioration. Furthermore, it is not anticipated that the patient will be medically stable for discharge from the hospital within 2 midnights of admission.   * I certify that at the point of admission it is my clinical judgment that the patient will require inpatient hospital care spanning beyond 2 midnights from the point of admission due to high intensity of service, high risk for further deterioration and high frequency of surveillance required.*   Author:  Rolm Gala MD  11/03/2022 8:47 AM

## 2022-11-04 DIAGNOSIS — I2489 Other forms of acute ischemic heart disease: Secondary | ICD-10-CM | POA: Diagnosis not present

## 2022-11-04 DIAGNOSIS — I471 Supraventricular tachycardia, unspecified: Secondary | ICD-10-CM | POA: Diagnosis not present

## 2022-11-04 LAB — GLUCOSE, CAPILLARY
Glucose-Capillary: 96 mg/dL (ref 70–99)
Glucose-Capillary: 89 mg/dL (ref 70–99)
Glucose-Capillary: 133 mg/dL — ABNORMAL HIGH (ref 70–99)
Glucose-Capillary: 95 mg/dL (ref 70–99)

## 2022-11-04 MED ORDER — ROSUVASTATIN CALCIUM 10 MG PO TABS: 10.0000 mg | ORAL_TABLET | Freq: Every day | ORAL | Status: AC

## 2022-11-04 MED ORDER — CEPHALEXIN 500 MG PO CAPS: 500.0000 mg | ORAL_CAPSULE | Freq: Two times a day (BID) | ORAL | Status: DC

## 2022-11-04 MED ORDER — METOPROLOL SUCCINATE ER 100 MG PO TB24: 100.0000 mg | ORAL_TABLET | Freq: Every day | ORAL | 0 refills | Status: DC

## 2022-11-04 MED ORDER — FOLIC ACID 1 MG PO TABS: 1.0000 mg | ORAL_TABLET | Freq: Every day | ORAL | Status: AC

## 2022-11-04 MED ORDER — LOSARTAN POTASSIUM 50 MG PO TABS: 50.0000 mg | ORAL_TABLET | Freq: Every day | ORAL | Status: DC

## 2022-11-04 MED ORDER — VITAMIN B-1 100 MG PO TABS: 100.0000 mg | ORAL_TABLET | Freq: Every day | ORAL | Status: AC

## 2022-11-04 MED ORDER — LABETALOL HCL 5 MG/ML IV SOLN: 10.0000 mg | INTRAVENOUS | Status: DC | PRN

## 2022-11-04 NOTE — Progress Notes (Addendum)
PROGRESS NOTE   Erica Ball  WUJ:[id number] DOB: 1951-01-30 DOA: 10/23/2022 PCP: Norm Salt, PA   Date of Service: the patient was seen and examined on 11/04/2022  Brief Narrative:  Erica Ball is a 72 y.o. female with a history of alcohol abuse, tobacco abuse, hypertension, COPD.  Patient presented secondary to a fall and subsequent left leg pain and found to have a left tibial plateau fracture.  Orthopedic surgery was consulted and recommended nonoperative management with application of left knee immobilizer and nonweightbearing to the right lower extremity.  Patient evaluated by PT/OT with recommendation for SNF.  Patient developed progressively worsening lethargy prior to discharge. MRI on 6/24 revealed evidence of acute/subacute stroke.    Assessment and Plan:  Pt is medically stable for discharge waiting for SNF placement   SVT No further episodes of SVT. Telemetry shows SR. -Continuous  telemetry -Cardiology signed off-follow up in 6-8 weeks with Dr Royann Shivers -Continue metoprolol  tartrate 100mg  daily  Elevated troponin, demand Per cardiology does not represent an acute coronary syndrome.  Echo normal.  Can have this addressed as an outpatient. -Continue aspirin and statin   Severe hypertension BP elevated to 141/91.  Patient is asymptomatic.  -Continue metoprolol  tartrate 100mg  daily -Continue losartan 50mg  -Establish with PCP as outpatient  UTI Dirty UA on 6/29. -Continue Keflex for 7 days  -Follow up urine cx  Tibial plateau fracture, left, closed  Secondary to fall. Orthopedic surgery was consulted and they were recommending conservative management with left knee immobilizer and NWB on left, patient will be weightbearing as tolerated on right. -Continue with pain management  Left ankle fracture Noted on CT imaging and likely related to recent fall. -Orthopedic surgery recommendations: NWB left leg  Encephalopathy Stable, seems to be at  baseline Initially thought related to Librium. Progressively worsened and now persistent. Librium last given on 6/20. CT head unremarkable. No fevers. TSH normal. No hypoglycemia. Vitamin B12 normal. CRP and sed rate mildly elevated. This seems likely to be metabolic in etiology, but unsure of exact etiology. ABG on 6/24 with compensated mild hypercapnia.  EEG with right hemispheric slowing, no seizure. -Continue to monitor  Subacute stroke, incidental finding CT head chronic right pontine, b/l periventricular WM and subcortical infarcts. 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 CTA head and neck no intracranial LVO, mild stenosis right V1, no other hemodynamically significant stenosis in the neck LDL of 62. 2D Echo EF 60-65% -Neurology signed off -Aspirin 81 mg, no DAPT given stroke is subacute -Follow-up in stroke clinic in 4 weeks  Hyperlipidemia -Continue Crestor 10 mg  Hyponatremia Na 130 today. Likely secondary to heavy alcohol use. Hyponatremia lab labs with hypotonic hyponatremia with low serum and urine osmolality.  Urinary sodium at 15.  TSH normal.  -Monitor sodium and if no improvement then she might need salt tablets  Alcohol abuse Patient drinks heavily daily.  Denies any prior withdrawals or seizures. -Counseling was provided-does not have much desire to quit -CIWA protocol -Continue multivitamin, thiamine, folic acid as able   COPD GOLD 0 / still smoking  Stable  -Continue with as needed bronchodilator  Tobacco abuse Counseling was provided. -Patient apparently declined nicotine patch  Subjective:  No acute concerns today.   Physical Exam:  Vitals:   11/04/22 0425 11/04/22 9562 11/04/22 1308  11/04/22 1200  BP: (!)  195/86 (!) 230/116 (!) 148/88 (!) 141/91  Pulse:  73 (!) 108 67  Resp: 20  18 18   Temp: (!) 97.5 F (36.4 C) 98.1 F (36.7 C)  98.6 F (37 C)  TempSrc: Oral Oral  Oral  SpO2: 100% 96% 97% 94%  Weight:      Height:        General: Alert, no acute distress, frail appearing, sitting up in bed Cardio: Normal S1 and S2, RRR, no r/m/g Pulm: CTAB, normal work of breathing Extremities: No peripheral edema.  Neuro: Cranial nerves grossly intact   Data Reviewed:  I have personally reviewed and interpreted labs, imaging.   CBC: Recent Labs  Lab 10/29/22 1325 10/31/22 0959 11/01/22 0907 11/02/22 0126 11/03/22 0118  WBC 6.9 8.1 7.1 6.1 5.6  HGB 13.3 12.7 12.8 11.3* 11.1*  HCT 38.9 37.7 37.6 34.0* 33.1*  MCV 98.7 99.5 101.9* 100.6* 100.9*  PLT 322 351 365 360 341    Basic Metabolic Panel: Recent Labs  Lab 10/30/22 0725 10/31/22 0704 10/31/22 0959 11/01/22 0907 11/02/22 0126 11/03/22 0118  NA 133*  --  134* 133* 131* 130*  K 4.2  --  3.8 3.7 3.8 3.9  CL 96*  --  96* 96* 99 94*  CO2 25  --  27 29 26 26   GLUCOSE 126*  --  116* 107* 99 99  BUN 17  --  12 14 13 10   CREATININE 0.53 0.52 0.38* 0.45 0.55 0.53  CALCIUM 8.8*  --  8.8* 8.7* 8.2* 8.5*  MG 1.7  --  1.7 1.8  --   --     GFR: Estimated Creatinine Clearance: 62.8 mL/min (by C-G formula based on SCr of 0.53 mg/dL). Liver Function Tests: No results for input(s): "AST", "ALT", "ALKPHOS", "BILITOT", "PROT", "ALBUMIN" in the last 168 hours.  Coagulation Profile: No results for input(s): "INR", "PROTIME" in the last 168 hours.  Code Status:  Full code.  Code status decision has been confirmed with: patient  Family Communication:   Severity of Illness:  The appropriate patient status for this patient is INPATIENT. Inpatient status is judged to be reasonable and necessary in order to provide the required intensity of service to ensure the patient's safety. The patient's presenting symptoms, physical exam findings,  and initial radiographic and laboratory data in the context of their chronic comorbidities is felt to place them at high risk for further clinical deterioration. Furthermore, it is not anticipated that the patient will be medically stable for discharge from the hospital within 2 midnights of admission.   * I certify that at the point of admission it is my clinical judgment that the patient will require inpatient hospital care spanning beyond 2 midnights from the point of admission due to high intensity of service, high risk for further deterioration and high frequency of surveillance required.*   Author:  Rolm Gala MD  11/04/2022 2:18 PM

## 2022-11-04 NOTE — Progress Notes (Signed)
Mobility Specialist: Progress Note   11/04/22 1617  Mobility  Activity Transferred from bed to chair  Level of Assistance +2 (takes two people)  Press photographer wheel walker  RLE Weight Bearing WBAT  LLE Weight Bearing NWB  Activity Response Tolerated fair  Mobility Referral Yes  $Mobility charge 1 Mobility  Mobility Specialist Start Time (ACUTE ONLY) 1529  Mobility Specialist Stop Time (ACUTE ONLY) 1614  Mobility Specialist Time Calculation (min) (ACUTE ONLY) 45 min   Pre-Mobility: 67 HR, 152/77 (100) BP Post-Mobility: 64 HR  Pt received in the bed and agreeable to mobility. Mod I to sit EOB and +2 modA to stand and pivot to the chair. Pt able to maintain WB precautions with standing when given verbal cues but unable to maintain during transfer. Urinary and bowel incontinent during transfer and assisted with pericare. Pt is in the chair with call bell and phone in reach. Chair alarm is on.   Khalin Royce Mobility Specialist Please contact via SecureChat or Rehab office at [phone]

## 2022-11-05 LAB — GLUCOSE, CAPILLARY
Glucose-Capillary: 96 mg/dL (ref 70–99)
Glucose-Capillary: 91 mg/dL (ref 70–99)
Glucose-Capillary: 101 mg/dL — ABNORMAL HIGH (ref 70–99)
Glucose-Capillary: 78 mg/dL (ref 70–99)
Glucose-Capillary: 139 mg/dL — ABNORMAL HIGH (ref 70–99)
Glucose-Capillary: 124 mg/dL — ABNORMAL HIGH (ref 70–99)

## 2022-11-05 LAB — CULTURE, OB URINE

## 2022-11-05 MED ORDER — OXYCODONE HCL 5 MG PO CAPS: 5.0000 mg | ORAL_CAPSULE | Freq: Four times a day (QID) | ORAL | 0 refills | Status: DC | PRN

## 2022-11-05 NOTE — Progress Notes (Signed)
Mobility Specialist Progress Note:   11/05/22 1700  Therapy Vitals  Patient Position (if appropriate) Sitting  Mobility  Activity Transferred from chair to bed  Level of Assistance +2 (takes two people)  Press photographer wheel walker  Distance Ambulated (ft) 4 ft  RLE Weight Bearing WBAT  LLE Weight Bearing NWB  Activity Response Tolerated fair  Mobility Referral Yes  $Mobility charge 1 Mobility  Mobility Specialist Start Time (ACUTE ONLY) 1651  Mobility Specialist Stop Time (ACUTE ONLY) 1718  Mobility Specialist Time Calculation (min) (ACUTE ONLY) 27 min    Pre Mobility: 180/82 BP Post Mobility:  189/83 BP  Pt received in chair, agreeable to mobility. Transferred from chair to bed, using RW. ModA+2 for stand and pivot. Pt unable to maintain LLE NWB status during transfer despite cues. Pt denied any pain, SOB, or dizziness. Left in bed with call bell and bed alarm on.  Erica Ball Mobility Specialist Please contact via Special educational needs teacher or Rehab office at [phone]

## 2022-11-05 NOTE — Progress Notes (Signed)
Physical Therapy Treatment Patient Details Name: Erica Ball MRN: [id number] DOB: May 14, 1950 Today's Date: 11/05/2022   History of Present Illness 72 year old female admitted 6/18 after fall, found to have left tibial plateau fracture and right metatarsal fracture; CT r/o metatarsal fractures on Rt 6/19. PMHx: alcohol abuse, tobacco abuse, HTN, COPD, mobility issues    PT Comments  Pt received in supine and agreeable to session. Pt demonstrating improved bed mobility with up to min guard this session. Pt continuing to have difficulty advancing LLE, but is able to use BUE to manage. Pt noted to have L foot resting in significant plantar flexion and was educated on maintaining neutral to prevent contracture. Pt able to stand from EOB with mod A +2 for power up due to difficulty with LLE NWB. Pt demonstrating improved support on RW and is able to pivot on RLE to recliner. Pt mostly able to maintain NWB, however is noted to lightly WB onto therapist's foot intermittently despite cues. Pt continues to benefit from PT services to progress toward functional mobility goals.      Assistance Recommended at Discharge Frequent or constant Supervision/Assistance  If plan is discharge home, recommend the following:  Can travel by private vehicle    Two people to help with walking and/or transfers;Two people to help with bathing/dressing/bathroom;Assist for transportation;Help with stairs or ramp for entrance   No  Equipment Recommendations  Other (comment)    Recommendations for Other Services       Precautions / Restrictions Precautions Precautions: Fall Required Braces or Orthoses: Knee Immobilizer - Left Restrictions Weight Bearing Restrictions: Yes RLE Weight Bearing: Weight bearing as tolerated LLE Weight Bearing: Non weight bearing     Mobility  Bed Mobility Overal bed mobility: Needs Assistance Bed Mobility: Supine to Sit     Supine to sit: Min guard, HOB elevated      General bed mobility comments: No use of bedrails and pt able to use BUE to manage LLE without assist    Transfers Overall transfer level: Needs assistance Equipment used: Rolling walker (2 wheels) Transfers: Sit to/from Stand, Bed to chair/wheelchair/BSC Sit to Stand: Mod assist, +2 physical assistance Stand pivot transfers: Mod assist, +2 physical assistance         General transfer comment: Mod A +2 for power up and balance. Dense cues for sequencing and technique. Pt able to heel-toe pivot R foot to sit in the recliner. Therapist's foot under pt's L foot to maintain NWB, however pt telling therapist to move foot and intermittently WB slightly    Ambulation/Gait               General Gait Details: unable      Balance Overall balance assessment: Needs assistance, History of Falls Sitting-balance support: No upper extremity supported, Feet supported Sitting balance-Leahy Scale: Fair Sitting balance - Comments: sitting EOB   Standing balance support: During functional activity, Bilateral upper extremity supported, Reliant on assistive device for balance Standing balance-Leahy Scale: Poor Standing balance comment: with RW and assist for balance                            Cognition Arousal/Alertness: Awake/alert Behavior During Therapy: Flat affect Overall Cognitive Status: No family/caregiver present to determine baseline cognitive functioning                                 General Comments:  Pt able to follow commands with increased time        Exercises      General Comments        Pertinent Vitals/Pain Pain Assessment Pain Assessment: Faces Faces Pain Scale: Hurts little more Pain Location: LLE with mobility Pain Descriptors / Indicators: Discomfort, Grimacing Pain Intervention(s): Monitored during session, Repositioned     PT Goals (current goals can now be found in the care plan section) Acute Rehab PT Goals Patient  Stated Goal: Get better PT Goal Formulation: With patient Time For Goal Achievement: 11/07/22 Potential to Achieve Goals: Fair Progress towards PT goals: Progressing toward goals    Frequency    Min 3X/week      PT Plan Current plan remains appropriate       AM-PAC PT "6 Clicks" Mobility   Outcome Measure  Help needed turning from your back to your side while in a flat bed without using bedrails?: A Little Help needed moving from lying on your back to sitting on the side of a flat bed without using bedrails?: A Little Help needed moving to and from a bed to a chair (including a wheelchair)?: Total Help needed standing up from a chair using your arms (e.g., wheelchair or bedside chair)?: Total Help needed to walk in hospital room?: Total Help needed climbing 3-5 steps with a railing? : Total 6 Click Score: 10    End of Session Equipment Utilized During Treatment: Gait belt;Left knee immobilizer Activity Tolerance: Patient tolerated treatment well;Patient limited by fatigue Patient left: with call bell/phone within reach;in chair;with chair alarm set Nurse Communication: Mobility status;Other (comment) (pt needs new purewick) PT Visit Diagnosis: Unsteadiness on feet (R26.81);Repeated falls (R29.6);Muscle weakness (generalized) (M62.81);History of falling (Z91.81);Difficulty in walking, not elsewhere classified (R26.2);Pain Pain - Right/Left: Left Pain - part of body: Knee     Time: 1201-1218 PT Time Calculation (min) (ACUTE ONLY): 17 min  Charges:    $Therapeutic Activity: 8-22 mins PT General Charges $$ ACUTE PT VISIT: 1 Visit                     Johny Shock, PTA Acute Rehabilitation Services Secure Chat Preferred  [phone]    Johny Shock 11/05/2022, 12:41 PM

## 2022-11-05 NOTE — Care Management Important Message (Signed)
Important Message  Patient Details  Name: Erica Ball MRN: [id number] Date of Birth: October 08, 1950   Medicare Important Message Given:  Yes     Renie Ora 11/05/2022, 10:31 AM

## 2022-11-05 NOTE — Progress Notes (Signed)
   11/05/22 1151  Spiritual Encounters  Type of Visit Initial  Care provided to: Patient  Referral source Family  Reason for visit Advance directives  OnCall Visit No  Advance Directives (For Healthcare)  Does Patient Have a Medical Advance Directive? No  Would patient like information on creating a medical advance directive? Yes (Inpatient - patient requests chaplain consult to create a medical advance directive)   Chaplain provided AD education to patient. Patient will let staff know to contact spiritual care when the paperwork is ready to be notarized.   Arlyce Dice, Chaplain Resident [phone]

## 2022-11-05 NOTE — TOC Progression Note (Addendum)
Transition of Care [facility]) - Progression Note    Patient Details  Name: CHARDONAE OLLER MRN: [id number] Date of Birth: Aug 07, 1950  Transition of [facility]) CM/SW Contact  Eduard Roux, [place] Phone Number: 11/05/2022, 4:00 PM  Clinical Narrative:     Met with patient and her sister [name] - introduced self and explained role. CSW explained previous authorization had expired and she will need re-authorization before d/c to [facility]. Patient remains agreeable to [facility] authorization for SNF -pending referrance # [id number]  TOC will continue to follow and assist with discharge planning.  Antony Blackbird, MSW, LCSW Clinical Social Worker    Expected Discharge Plan: Skilled Nursing Facility Barriers to Discharge: Continued Medical Work up, SNF Pending bed offer  Expected Discharge Plan and Services In-house Referral: Clinical Social Work   Post Acute Care Choice: Skilled Nursing Facility Living arrangements for the past 2 months: Single Family Home                                       Social Determinants of Health (SDOH) Interventions SDOH Screenings   Food Insecurity: No Food Insecurity (10/24/2022)  Housing: Low Risk  (10/24/2022)  Transportation Needs: No Transportation Needs (10/24/2022)  Utilities: Not At Risk (10/24/2022)  Tobacco Use: High Risk (11/01/2022)    Readmission Risk Interventions     No data to display

## 2022-11-05 NOTE — Discharge Summary (Signed)
Physician Discharge Summary  Erica Ball:[id number] DOB: 10-28-1950 DOA: 10/23/2022  PCP: Norm Salt, PA  Admit date: 10/23/2022 Discharge date: 11/06/2022  Admitted From: Home Disposition:  SNF  Recommendations for Outpatient Follow-up:  Follow up with PCP in 1-2 weeks Please obtain BMP/CBC in one week your next doctors visit.  Pain medications prescribed Oral Keflex until 7/6 Started on losartan 50 mg daily, Toprol-XL 100 mg daily, Crestor, thiamine   Discharge Condition: Stable CODE STATUS: Full code Diet recommendation: Low-salt  Brief/Interim Summary: 72 y.o. female with a history of alcohol abuse, tobacco abuse, hypertension, COPD.  Patient presented secondary to a fall and subsequent left leg pain and found to have a left tibial plateau fracture.  Orthopedic surgery was consulted and recommended nonoperative management with application of left knee immobilizer and nonweightbearing to the right lower extremity.  Patient evaluated by PT/OT with recommendation for SNF.  Patient developed progressively worsening lethargy prior to discharge. MRI on 6/24 revealed evidence of acute/subacute stroke. Patient's medications were adjusted as mentioned above.  Also diagnosed with UTI started on antibiotics.  PT recommended SNF, arrangements made.   Assessment and Plan:   Pt is medically stable for discharge waiting for SNF placement    SVT No further episodes of SVT. Telemetry shows SR. -Continuous  telemetry -Cardiology signed off-follow up in 6-8 weeks with Dr Royann Shivers -Continue metoprolol  tartrate 100mg  daily   Elevated troponin, demand Per cardiology does not represent an acute coronary syndrome.  Echo normal.  Can have this addressed as an outpatient. -Continue aspirin and statin    Severe hypertension On losartan and metoprolol   UTI Dirty UA on 6/29. -Continue Keflex for 7 days  -Follow up urine cx   Tibial plateau fracture, left, closed  Secondary to  fall. Orthopedic surgery was consulted and they were recommending conservative management with left knee immobilizer and NWB on left, patient will be weightbearing as tolerated on right. -Continue with pain management, as needed bowel regimen   Left ankle fracture Noted on CT imaging and likely related to recent fall. -Orthopedic surgery recommendations: NWB left leg   Encephalopathy, adult Stable, seems to be at baseline Initially thought related to Librium. Progressively worsened and now persistent. Librium last given on 6/20. CT head unremarkable. No fevers. TSH normal. No hypoglycemia. Vitamin B12 normal. CRP and sed rate mildly elevated. This seems likely to be metabolic in etiology, but unsure of exact etiology. ABG on 6/24 with compensated mild hypercapnia.  EEG with right hemispheric slowing, no seizure. -Continue to monitor   Subacute stroke, incidental finding CT head chronic right pontine, b/l periventricular WM and subcortical infarcts. 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 CTA head and neck no intracranial LVO, mild stenosis right V1, no other hemodynamically significant stenosis in the neck LDL of 62. 2D Echo EF 60-65% -Neurology signed off -Aspirin 81 mg, no DAPT given stroke is subacute -Follow-up in stroke clinic in 4 weeks   Hyperlipidemia -Continue Crestor 10 mg   Hyponatremia Na 130 today. Likely secondary to heavy alcohol use.  Encourage p.o. intake  Alcohol abuse Not showing any signs of alcohol withdrawal -Continue multivitamin, thiamine, folic acid as able    COPD GOLD 0 / still smoking  Stable  -Continue with as needed bronchodilator   Tobacco abuse Counseling was provided. -Patient  apparently declined  nicotine patch    Discharge Diagnoses:  Principal Problem:   Tibial plateau fracture, left, closed, initial encounter Active Problems:   Alcohol abuse   Hyponatremia   Hypertension   COPD GOLD 0 / still smoking    Fall at home, initial encounter   Tobacco abuse   Hypomagnesemia   Malnutrition of moderate degree   SVT (supraventricular tachycardia)   Demand ischemia      Subjective: Feeling well no complaints.  Ready to go to rehab  Discharge Exam: Vitals:   11/06/22 0342 11/06/22 0753  BP: (!) 160/82 (!) 197/101  Pulse: 63   Resp: 19 14  Temp: 97.7 F (36.5 C) 98.1 F (36.7 C)  SpO2: 95% 90%   Vitals:   11/05/22 1924 11/05/22 2325 11/06/22 0342 11/06/22 0753  BP: (!) 169/74 (!) 171/76 (!) 160/82 (!) 197/101  Pulse: 62 60 63   Resp: 17 20 19 14   Temp: (!) 97.5 F (36.4 C) 97.7 F (36.5 C) 97.7 F (36.5 C) 98.1 F (36.7 C)  TempSrc: Oral Oral Oral Oral  SpO2: 100% 100% 95% 90%  Weight:      Height:        General: Pt is alert, awake, not in acute distress Cardiovascular: RRR, S1/S2 +, no rubs, no gallops Respiratory: CTA bilaterally, no wheezing, no rhonchi Abdominal: Soft, NT, ND, bowel sounds + Extremities: no edema, no cyanosis  Discharge Instructions  Discharge Instructions     Ambulatory referral to Neurology   Complete by: As directed    Follow up with stroke clinic NP (Jessica Vanschaick or Darrol Angel, if both not available, consider Manson Allan, or A[facility]) at [facility] in about 4 weeks. Thanks.      Allergies as of 11/06/2022       Reactions   Dextromethorphan Other (See Comments)   Makes her very angry and she wants to hurt people        Medication List     STOP taking these medications    traMADol 50 MG tablet Commonly known as: ULTRAM       TAKE these medications    albuterol 108 (90 Base) MCG/ACT inhaler Commonly known as: VENTOLIN HFA Inhale 1-2 puffs into the lungs every 4 (four) hours as needed for shortness of  breath.   aspirin EC 81 MG tablet Take 1 tablet (81 mg total) by mouth daily.   cephALEXin 500 MG capsule Commonly known as: KEFLEX Take 1 capsule (500 mg total) by mouth every 12 (twelve) hours for 4 days.   folic acid 1 MG tablet Commonly known as: FOLVITE Take 1 tablet (1 mg total) by mouth daily.   losartan 100 MG tablet Commonly known as: COZAAR Take 1 tablet (100 mg total) by mouth daily.   metoprolol succinate 100 MG 24 hr tablet Commonly known as: Toprol XL Take 1 tablet (100 mg total) by mouth daily. Take with or immediately following a meal.   oxycodone 5 MG capsule Commonly known as: OXY-IR Take 1 capsule (5 mg total) by mouth every 6 (six) hours as needed for pain.   PRIMATENE MIST IN Inhale 1 puff into the lungs daily as needed (congestion).   rosuvastatin 10 MG tablet Commonly known as: CRESTOR Take 1 tablet (10 mg total) by mouth daily.   thiamine 100 MG tablet Commonly known as: Vitamin B-1 Take 1 tablet (100 mg total) by mouth daily.        Follow-up Information     [place]  [facility]. Schedule an appointment as soon as possible for a visit in 1 month(s).   Specialty: Neurology Why: stroke clinic Contact information: [address] [place] [address] [phone]        Croitoru, Rachelle Hora, MD Follow up.   Specialty: Cardiology Why: Humberto Seals - office will call you to arrange a follow-up appointment. Please make sure to verify when the office calls which location you'll be seen at, since Dr. Erin Hearing team works out of the [employer]. Contact information: [address] [place] [address] [phone]         Norm Salt, [place] Follow up in 1 week(s).   Specialty: Physician Assistant Contact information: [address] [place] [address] [phone]                Allergies  Allergen Reactions   Dextromethorphan Other (See Comments)     Makes her very angry and she wants to hurt people    You were cared for by a hospitalist during your hospital stay. If you have any questions about your discharge medications or the care you received while you were in the hospital after you are discharged, you can call the unit and asked to speak with the hospitalist on call if the hospitalist that took care of you is not available. Once you are discharged, your primary care physician will handle any further medical issues. Please note that no refills for any discharge medications will be authorized once you are discharged, as it is imperative that you return to your primary care physician (or establish a relationship with a primary care physician if you do not have one) for your aftercare needs so that they can reassess your need for medications and monitor your lab values.  You were cared for by a hospitalist during your hospital stay. If you have any questions about your discharge medications or the care you received while you were in the hospital after you are discharged, you can call the unit and asked to speak with the hospitalist on call if the hospitalist that took care of you is not available. Once you are discharged, your primary care physician will handle any further medical issues. Please note that NO REFILLS for any discharge medications will be authorized once you are discharged, as it is imperative that you return to your primary care physician (or establish a relationship with a primary care physician if you do not have one) for your aftercare needs so that they can reassess your need for medications and monitor your lab values.  Please request your Prim.MD to go over all Hospital Tests and Procedure/Radiological results at the follow up, please get all Hospital records sent to your Prim MD by signing hospital release before you go home.  Get CBC, CMP, 2 view Chest X ray checked  by Primary MD during your next visit or SNF MD in 5-7 days (  we routinely change or add medications that can affect your baseline labs and fluid status, therefore we recommend that you get the mentioned basic workup next visit with your PCP, your PCP may decide not to get them or add new tests based on their clinical decision)  On your next visit with your primary care physician please Get Medicines reviewed and adjusted.  If you experience worsening of your admission symptoms, develop shortness of breath, life threatening emergency, suicidal or homicidal thoughts you must seek medical attention immediately by calling 911 or  calling your MD immediately  if symptoms less severe.  You Must read complete instructions/literature along with all the possible adverse reactions/side effects for all the Medicines you take and that have been prescribed to you. Take any new Medicines after you have completely understood and accpet all the possible adverse reactions/side effects.   Do not drive, operate heavy machinery, perform activities at heights, swimming or participation in water activities or provide baby sitting services if your were admitted for syncope or siezures until you have seen by Primary MD or a Neurologist and advised to do so again.  Do not drive when taking Pain medications.   Procedures/Studies: EEG adult  Result Date: 11/28/2022 Charlsie Quest, MD     2022/11/28 11:04 AM Patient Name: Erica Ball MRN: [id number] Epilepsy Attending: Charlsie Quest Referring Physician/Provider: Marvel Plan, MD Date: 11/28/2022 Duration: 22.55 mins Patient history: 72yo F with ams getting eeg to evaluate for seizure. Level of alertness: Awake AEDs during EEG study: None Technical aspects: This EEG study was done with scalp electrodes positioned according to the 10-20 International system of electrode placement. Electrical activity was reviewed with band pass filter of 1-70Hz , sensitivity of 7 uV/mm, display speed of 67mm/sec with a 60Hz  notched filter applied as  appropriate. EEG data were recorded continuously and digitally stored.  Video monitoring was available and reviewed as appropriate. Description: No clear posterior dominant rhythm was seen. EEG also showed continuous generalized and lateralized right hemisphere mixed frequencies with predominantly 6 to 9 Hz theta-alpha activity admixed with intermittent generalized 2 to 3 Hz delta slowing.  Physiologic photic driving was not seen during photic stimulation. Hyperventilation was not performed.   ABNORMALITY - Continuous slow, generalized and lateralized right hemisphere IMPRESSION: This study is suggestive of cortical dysfunction arising from right hemisphere likely secondary to underlying structural abnormality. Additionally there is moderate diffuse encephalopathy. No seizures or epileptiform discharges were seen throughout the recording. Charlsie Quest   ECHOCARDIOGRAM COMPLETE  Result Date: 10/30/2022    ECHOCARDIOGRAM REPORT   Patient Name:   NALEYAH BEZIO Hugill Date of Exam: 10/30/2022 Medical Rec #:  [id number]         Height:       68.0 in Accession #:    [id number]        Weight:       140.0 lb Date of Birth:  Sep 23, 1950        BSA:          1.756 m Patient Age:    39 years          BP:           150/84 mmHg Patient Gender: F                 HR:           80 bpm. Exam Location:  Inpatient Procedure: 2D Echo, Color Doppler and Cardiac Doppler Indications:    CVA  History:        Patient has prior history of Echocardiogram examinations. COPD;                 Risk Factors:Hypertension and Current Smoker. Sepsis. ETOH                 abuse.  Sonographer:    Aron Baba Referring Phys: [phone] RALPH A NETTEY IMPRESSIONS  1. Left ventricular ejection fraction, by estimation, is 60 to 65%. The left ventricle has normal function. The left ventricle has no  regional wall motion abnormalities. Left ventricular diastolic parameters are consistent with Grade I diastolic dysfunction (impaired relaxation).  2. Right  ventricular systolic function is normal. The right ventricular size is normal.  3. Left atrial size was mildly dilated.  4. The mitral valve is normal in structure. No evidence of mitral valve regurgitation. No evidence of mitral stenosis.  5. The aortic valve is normal in structure. Aortic valve regurgitation is mild to moderate. No aortic stenosis is present.  6. The inferior vena cava is normal in size with greater than 50% respiratory variability, suggesting right atrial pressure of 3 mmHg. FINDINGS  Left Ventricle: Left ventricular ejection fraction, by estimation, is 60 to 65%. The left ventricle has normal function. The left ventricle has no regional wall motion abnormalities. The left ventricular internal cavity size was normal in size. There is  no left ventricular hypertrophy. Left ventricular diastolic parameters are consistent with Grade I diastolic dysfunction (impaired relaxation). Right Ventricle: The right ventricular size is normal. No increase in right ventricular wall thickness. Right ventricular systolic function is normal. Left Atrium: Left atrial size was mildly dilated. Right Atrium: Right atrial size was normal in size. Pericardium: There is no evidence of pericardial effusion. Mitral Valve: The mitral valve is normal in structure. No evidence of mitral valve regurgitation. No evidence of mitral valve stenosis. Tricuspid Valve: The tricuspid valve is normal in structure. Tricuspid valve regurgitation is mild . No evidence of tricuspid stenosis. Aortic Valve: The aortic valve is normal in structure. Aortic valve regurgitation is mild to moderate. Aortic regurgitation PHT measures 677 msec. No aortic stenosis is present. Pulmonic Valve: The pulmonic valve was normal in structure. Pulmonic valve regurgitation is not visualized. No evidence of pulmonic stenosis. Aorta: The aortic root is normal in size and structure. Venous: The inferior vena cava is normal in size with greater than 50%  respiratory variability, suggesting right atrial pressure of 3 mmHg. IAS/Shunts: No atrial level shunt detected by color flow Doppler.  LEFT VENTRICLE PLAX 2D LVIDd:         4.40 cm   Diastology LVIDs:         2.90 cm   LV e' medial:    5.33 cm/s LV PW:         0.60 cm   LV E/e' medial:  13.7 LV IVS:        0.90 cm   LV e' lateral:   8.59 cm/s LVOT diam:     1.90 cm   LV E/e' lateral: 8.5 LV SV:         71 LV SV Index:   41 LVOT Area:     2.84 cm  RIGHT VENTRICLE RV S prime:     15.40 cm/s TAPSE (M-mode): 2.4 cm LEFT ATRIUM             Index        RIGHT ATRIUM           Index LA Vol (A2C):   74.8 ml 42.59 ml/m  RA Area:     12.70 cm LA Vol (A4C):   56.0 ml 31.89 ml/m  RA Volume:   26.00 ml  14.80 ml/m LA Biplane Vol: 70.7 ml 40.26 ml/m  AORTIC VALVE             PULMONIC VALVE LVOT Vmax:   129.00 cm/s PR End Diast Vel: 11.16 msec LVOT Vmean:  83.300 cm/s LVOT VTI:    0.252 m AI PHT:  677 msec  AORTA Ao Root diam: 3.50 cm Ao Asc diam:  3.20 cm MITRAL VALVE                TRICUSPID VALVE MV Area (PHT): 3.77 cm     TR Peak grad:   26.8 mmHg MV Decel Time: 201 msec     TR Vmax:        259.00 cm/s MV E velocity: 73.20 cm/s MV A velocity: 148.00 cm/s  SHUNTS MV E/A ratio:  0.49         Systemic VTI:  0.25 m                             Systemic Diam: 1.90 cm Kardie Tobb DO Electronically signed by Thomasene Ripple DO Signature Date/Time: 10/30/2022/3:20:07 PM    Final    CT ANGIO HEAD NECK W WO CM  Result Date: 10/30/2022 CLINICAL DATA:  Scattered acute and subacute infarcts on 10/29/2022 MRI, determine embolic source EXAM: CT ANGIOGRAPHY HEAD AND NECK WITH AND WITHOUT CONTRAST TECHNIQUE: Multidetector CT imaging of the head and neck was performed using the standard protocol during bolus administration of intravenous contrast. Multiplanar CT image reconstructions and MIPs were obtained to evaluate the vascular anatomy. Carotid stenosis measurements (when applicable) are obtained utilizing NASCET criteria, using  the distal internal carotid diameter as the denominator. RADIATION DOSE REDUCTION: This exam was performed according to the departmental dose-optimization program which includes automated exposure control, adjustment of the mA and/or kV according to patient size and/or use of iterative reconstruction technique. CONTRAST:  75mL OMNIPAQUE IOHEXOL 350 MG/ML SOLN COMPARISON:  No prior CTA available, correlation is made with CT head 10/28/2022 and MRI 10/29/2022 FINDINGS: CT HEAD FINDINGS Paste that Brain: No evidence of acute hemorrhage, mass, mass effect, or midline shift. No hydrocephalus or extra-axial fluid collection. Redemonstrated scattered hypodense foci in the periventricular and subcortical white matter, some of which correlate with the acute and subacute infarcts seen on the 10/29/2022 MRI, although there is also a background of moderate chronic small vessel ischemic disease. Vascular: No hyperdense vessel. Atherosclerotic calcifications in the intracranial carotid and vertebral arteries. Skull: Negative for fracture or focal lesion. Sinuses/Orbits: Mucosal thickening in the ethmoid air cells and right sphenoid sinus. Status post bilateral lens replacements. Other: The mastoid air cells are well aerated. CTA NECK FINDINGS Aortic arch: Standard branching. Imaged portion shows no evidence of aneurysm or dissection. No significant stenosis of the major arch vessel origins. Aortic atherosclerosis. Right carotid system: No evidence of dissection, occlusion, or hemodynamically significant stenosis (greater than 50%). Atherosclerotic disease at the bifurcation and in the proximal ICA is not hemodynamically significant. Left carotid system: No evidence of dissection, occlusion, or hemodynamically significant stenosis (greater than 50%). Atherosclerotic disease at the bifurcation and in the proximal ICA is not hemodynamically significant. Vertebral arteries: Mild stenosis in the proximal right V1. The vertebral  arteries are otherwise patent to the skull base, without significant stenosis, dissection, or occlusion. Skeleton: Negative. Other neck: Emphysema. No focal pulmonary opacity or pleural effusion. Upper chest: No focal pulmonary opacity or pleural effusion. Review of the MIP images confirms the above findings CTA HEAD FINDINGS Anterior circulation: Both internal carotid arteries are patent to the termini, with calcifications but without significant stenosis. A1 segments patent. Normal anterior communicating artery. Anterior cerebral arteries are patent to their distal aspects without significant stenosis. No M1 stenosis or occlusion. MCA branches perfused to their distal aspects without  significant stenosis. Posterior circulation: Vertebral arteries patent to the vertebrobasilar junction, with mild stenosis in the distal right V4, after the PICA takeoff. Left dominant system. Posterior inferior cerebellar arteries patent proximally. Basilar patent to its distal aspect without significant stenosis. Superior cerebellar arteries patent proximally. Patent left P1. Fetal origin of the right PCA from the right posterior communicating artery. PCAs perfused to their distal aspects without significant stenosis. The left posterior communicating artery is also patent. Venous sinuses: Not well opacified due to phase of contrast. Anatomic variants: Fetal origin of the right PCA. Review of the MIP images confirms the above findings IMPRESSION: 1. No intracranial large vessel occlusion. Mild stenosis in the distal right V4. 2. Mild stenosis in the proximal right V1. No other hemodynamically significant stenosis in the neck. 3. Aortic atherosclerosis and emphysema. Aortic Atherosclerosis (ICD10-I70.0) and Emphysema (ICD10-J43.9). Electronically Signed   By: Wiliam Ke M.D.   On: 10/30/2022 11:41   MR BRAIN WO CONTRAST  Result Date: 10/29/2022 CLINICAL DATA:  Mental status change, unknown cause. EXAM: MRI HEAD WITHOUT CONTRAST  TECHNIQUE: Multiplanar, multiecho pulse sequences of the brain and surrounding structures were obtained without intravenous contrast. COMPARISON:  CT head without contrast 10/28/2022 FINDINGS: Brain: Scattered small foci of restricted diffusion present the subcortical white matter bilaterally. The largest lesion is in the left corona radiata to the external capsule where a cylindrical area restricted diffusion is present. The areas in question all demonstrate T2 and FLAIR hyperintensities remote lacunar infarcts are present within the thalami bilaterally. Remote lacunar infarcts are present within the right posterolateral pons extending into the middle cerebellar peduncle. Remote lacunar infarct is present in the left cerebellum. Mild generalized atrophy is present. The ventricles are proportionate to the degree of atrophy. No significant extraaxial fluid collection is present. Scattered foci of susceptibility are present throughout the white matter both above and below the tentorium. Vascular: Flow is present in the major intracranial arteries. Skull and upper cervical spine: The craniocervical junction is normal. Upper cervical spine is within normal limits. Marrow signal is unremarkable. Sinuses/Orbits: A left mastoid effusion is present. No obstructing nasopharyngeal lesion is present. Bilateral lens replacements are noted. Globes and orbits are otherwise unremarkable. IMPRESSION: 1. Scattered small foci of restricted diffusion present the subcortical white matter bilaterally. The largest lesion is in the left corona radiata to the external capsule where a cylindrical area of restricted diffusion is present. Findings are consistent with acute/subacute nonhemorrhagic infarcts. This suggests a central embolic source or small vessel microangiopathy. 2. Remote lacunar infarcts of the right posterolateral pons extending into the middle cerebellar peduncle. 3. Remote lacunar infarcts of the thalami bilaterally. 4.  Remote lacunar infarct of the left cerebellum. 5. Scattered foci of susceptibility throughout the white matter both above and below the tentorium. This is also consistent with microvascular angiopathy, potentially amyloid angiopathy 6. Left mastoid effusion. No obstructing nasopharyngeal lesion is present. Electronically Signed   By: Marin Roberts M.D.   On: 10/29/2022 16:07   CT ANKLE LEFT WO CONTRAST  Result Date: 10/28/2022 CLINICAL DATA:  Ankle trauma, fracture, xray done (Age >= 5y) EXAM: CT OF THE LEFT ANKLE WITHOUT CONTRAST TECHNIQUE: Multidetector CT imaging of the left ankle was performed according to the standard protocol. Multiplanar CT image reconstructions were also generated. RADIATION DOSE REDUCTION: This exam was performed according to the departmental dose-optimization program which includes automated exposure control, adjustment of the mA and/or kV according to patient size and/or use of iterative reconstruction technique. COMPARISON:  X-ray  10/28/2022 FINDINGS: Bones/Joint/Cartilage Diffuse osseous demineralization. Subacute-appearing healing nondisplaced fracture of the lateral malleolus (series 4, images 37-39). No medial or posterior malleolar fracture. Ankle mortise is congruent without dislocation. Bones of the hindfoot are intact. Joint spaces are relatively well preserved. No erosion. No bone lesion. Ligaments Suboptimally assessed by CT. Muscles and Tendons No acute musculotendinous abnormality by CT. Soft tissues Soft tissue swelling most pronounced at the lateral aspect of the ankle. No organized hematoma. IMPRESSION: 1. Subacute-appearing healing nondisplaced fracture of the lateral malleolus. 2. Soft tissue swelling most pronounced at the lateral aspect of the ankle. Electronically Signed   By: Duanne Guess D.O.   On: 10/28/2022 16:30   CT HEAD WO CONTRAST ( )  Result Date: 10/28/2022 CLINICAL DATA:  Altered level of consciousness EXAM: CT HEAD WITHOUT CONTRAST  TECHNIQUE: Contiguous axial images were obtained from the base of the skull through the vertex without intravenous contrast. RADIATION DOSE REDUCTION: This exam was performed according to the departmental dose-optimization program which includes automated exposure control, adjustment of the mA and/or kV according to patient size and/or use of iterative reconstruction technique. COMPARISON:  03/08/2006 FINDINGS: Brain: Scattered hypodensities are seen throughout the periventricular and subcortical white matter and right-sided pons, consistent with age-indeterminate small vessel ischemic changes. No other signs of acute infarct or hemorrhage. Lateral ventricles and midline structures are unremarkable. No acute extra-axial fluid collections. No mass effect. Vascular: No hyperdense vessel or unexpected calcification. Skull: Normal. Negative for fracture or focal lesion. Sinuses/Orbits: No acute finding. Other: None. IMPRESSION: 1. Age-indeterminate small-vessel ischemic changes throughout the periventricular and subcortical white matter and right-sided pons, favor chronic. 2. No acute hemorrhage. Electronically Signed   By: Sharlet Salina M.D.   On: 10/28/2022 14:38   DG Ankle Complete Left  Result Date: 10/28/2022 CLINICAL DATA:  Fall with ankle swelling EXAM: LEFT ANKLE COMPLETE - 3 VIEW COMPARISON:  None Available. FINDINGS: Apparent cortical discontinuity along the lateral and posterior aspect of the distal fibular diaphysis. Diffuse osteopenia. Diffuse soft tissue edema about the ankle. IMPRESSION: Apparent cortical discontinuity along the lateral and posterior aspect of the distal fibular diaphysis, which may represent a nondisplaced fracture. Electronically Signed   By: Agustin Cree M.D.   On: 10/28/2022 13:01   CT Foot Right Wo Contrast  Result Date: 10/23/2022 CLINICAL DATA:  Fall and right foot pain. EXAM: CT OF THE RIGHT FOOT WITHOUT CONTRAST TECHNIQUE: Multidetector CT imaging of the right foot was  performed according to the standard protocol. Multiplanar CT image reconstructions were also generated. RADIATION DOSE REDUCTION: This exam was performed according to the departmental dose-optimization program which includes automated exposure control, adjustment of the mA and/or kV according to patient size and/or use of iterative reconstruction technique. COMPARISON:  Right foot radiograph dated 10/23/2022. FINDINGS: Bones/Joint/Cartilage Evaluation of the bones is very limited due to advanced osteoporosis. No acute fracture or dislocation. MRI or bone scan may provide better evaluation if there is high clinical concern for acute fracture. Prior internal fixation of the lateral and medial malleoli. Ligaments Suboptimally assessed by CT. Muscles and Tendons No acute findings. Soft tissues There is mild subcutaneous edema of the dorsum of the foot. IMPRESSION: 1. No acute fracture or dislocation. 2. Advanced osteoporosis. Electronically Signed   By: Elgie Collard M.D.   On: 10/23/2022 22:23   CT Knee Left Wo Contrast  Result Date: 10/23/2022 CLINICAL DATA:  Tibial plateau fracture EXAM: CT OF THE LEFT KNEE WITHOUT CONTRAST TECHNIQUE: Multidetector CT imaging of the left knee  was performed according to the standard protocol. Multiplanar CT image reconstructions were also generated. RADIATION DOSE REDUCTION: This exam was performed according to the departmental dose-optimization program which includes automated exposure control, adjustment of the mA and/or kV according to patient size and/or use of iterative reconstruction technique. COMPARISON:  None Available. FINDINGS: Bones/Joint/Cartilage The bones are diffusely osteopenic. There is an acute minimally depressed (3 mm) medial tibial plateau fracture. Acute fracture involves the posterior aspect of the tibial spines. This is nondisplaced. There is minimal medial and lateral compartment joint space narrowing with chondrocalcinosis. Large knee joint effusion  is present with fat fluid levels. Ligaments Suboptimally assessed by CT. Muscles and Tendons Grossly intact, but well evaluated on noncontrast CT. Soft tissues There is mild subcutaneous edema surrounding the knee. Peripheral vascular calcifications are present. IMPRESSION: 1. Acute minimally depressed medial tibial plateau fracture. 2. Acute nondisplaced fracture of the posterior aspect of the tibial spines. 3. Large lipohemarthrosis. Electronically Signed   By: Darliss Cheney M.D.   On: 10/23/2022 22:22   DG Foot Complete Left  Result Date: 10/23/2022 CLINICAL DATA:  Fall with foot pain EXAM: LEFT FOOT - COMPLETE 3+ VIEW COMPARISON:  None Available. FINDINGS: Osteopenia limits fracture assessment. Hallux valgus deformity at the first MTP joint with slight lateral subluxation base of first proximal phalanx. Mild degenerative change at the first MTP joint. Dorsal soft tissue swelling. IMPRESSION: Osteopenia limits fracture assessment. No definite acute osseous abnormality. Dorsal soft tissue swelling. Electronically Signed   By: Jasmine Pang M.D.   On: 10/23/2022 19:05   DG Foot Complete Right  Result Date: 10/23/2022 CLINICAL DATA:  Fall with pain EXAM: RIGHT FOOT COMPLETE - 3+ VIEW COMPARISON:  None Available. FINDINGS: Bones appear osteopenic which limits fracture assessment. Surgical plate and fixating screws in the distal fibula with fixating screws at the distal tibia. No malalignment. Acute appearing fractures involving the necks of the right third and fourth metatarsals. IMPRESSION: Acute appearing fractures involving the necks of the right third and fourth metatarsals. Electronically Signed   By: Jasmine Pang M.D.   On: 10/23/2022 19:02   DG Knee Complete 4 Views Left  Result Date: 10/23/2022 CLINICAL DATA:  Fall with knee pain EXAM: LEFT KNEE - COMPLETE 4+ VIEW COMPARISON:  None Available. FINDINGS: Bones appear osteopenic. No malalignment. Moderate knee effusion. Acute nondisplaced  intra-articular fracture involving the proximal tibia with lucency to the medial tibial plateau. IMPRESSION: 1. Acute nondisplaced intra-articular fracture involving the proximal tibia with lucency extending to the medial tibial plateau. 2. Moderate knee effusion. Electronically Signed   By: Jasmine Pang M.D.   On: 10/23/2022 19:01     The results of significant diagnostics from this hospitalization (including imaging, microbiology, ancillary and laboratory) are listed below for reference.     Microbiology: Recent Results (from the past 240 hour(s))  Culture, OB Urine     Status: None (Preliminary result)   Collection Time: 11/03/22 10:23 AM   Specimen: Urine, Random  Result Value Ref Range Status   Specimen Description URINE, RANDOM  Final   Special Requests NONE  Final   Culture   Final    CULTURE REINCUBATED FOR BETTER GROWTH Performed at [facility], [address] [place], [place] [address]    Report Status PENDING  Incomplete     Labs: BNP (last 3 results) No results for input(s): "BNP" in the last 8760 hours. Basic Metabolic Panel: Recent Labs  Lab 10/31/22 0704 10/31/22 0959 11/01/22 0907 11/02/22 0126  11/03/22 0118  NA  --  134* 133* 131* 130*  K  --  3.8 3.7 3.8 3.9  CL  --  96* 96* 99 94*  CO2  --  27 29 26 26   GLUCOSE  --  116* 107* 99 99  BUN  --  12 14 13 10   CREATININE 0.52 0.38* 0.45 0.55 0.53  CALCIUM  --  8.8* 8.7* 8.2* 8.5*  MG  --  1.7 1.8  --   --    Liver Function Tests: No results for input(s): "AST", "ALT", "ALKPHOS", "BILITOT", "PROT", "ALBUMIN" in the last 168 hours. No results for input(s): "LIPASE", "AMYLASE" in the last 168 hours. No results for input(s): "AMMONIA" in the last 168 hours. CBC: Recent Labs  Lab 10/31/22 0959 11/01/22 0907 11/02/22 0126 11/03/22 0118  WBC 8.1 7.1 6.1 5.6  HGB 12.7 12.8 11.3* 11.1*  HCT 37.7 37.6 34.0* 33.1*  MCV 99.5 101.9* 100.6* 100.9*  PLT 351 365 360 341   Cardiac Enzymes: No results  for input(s): "CKTOTAL", "CKMB", "CKMBINDEX", "TROPONINI" in the last 168 hours. BNP: Invalid input(s): "POCBNP" CBG: Recent Labs  Lab 11/05/22 0638 11/05/22 1139 11/05/22 1721 11/05/22 2324 11/06/22 0605  GLUCAP 101* 78 139* 124* 107*   D-Dimer No results for input(s): "DDIMER" in the last 72 hours. Hgb A1c No results for input(s): "HGBA1C" in the last 72 hours. Lipid Profile No results for input(s): "CHOL", "HDL", "LDLCALC", "TRIG", "CHOLHDL", "LDLDIRECT" in the last 72 hours. Thyroid function studies No results for input(s): "TSH", "T4TOTAL", "T3FREE", "THYROIDAB" in the last 72 hours.  Invalid input(s): "FREET3" Anemia work up No results for input(s): "VITAMINB12", "FOLATE", "FERRITIN", "TIBC", "IRON", "RETICCTPCT" in the last 72 hours. Urinalysis    Component Value Date/Time   COLORURINE YELLOW 11/03/2022 1002   APPEARANCEUR HAZY (A) 11/03/2022 1002   LABSPEC 1.005 11/03/2022 1002   PHURINE 7.0 11/03/2022 1002   GLUCOSEU NEGATIVE 11/03/2022 1002   HGBUR SMALL (A) 11/03/2022 1002   BILIRUBINUR NEGATIVE 11/03/2022 1002   BILIRUBINUR neg 10/11/2011 1425   KETONESUR NEGATIVE 11/03/2022 1002   PROTEINUR NEGATIVE 11/03/2022 1002   UROBILINOGEN 0.2 10/11/2011 1425   UROBILINOGEN 0.2 01/24/2008 2217   NITRITE NEGATIVE 11/03/2022 1002   LEUKOCYTESUR MODERATE (A) 11/03/2022 1002   Sepsis Labs Recent Labs  Lab 10/31/22 0959 11/01/22 0907 11/02/22 0126 11/03/22 0118  WBC 8.1 7.1 6.1 5.6   Microbiology Recent Results (from the past 240 hour(s))  Culture, OB Urine     Status: None (Preliminary result)   Collection Time: 11/03/22 10:23 AM   Specimen: Urine, Random  Result Value Ref Range Status   Specimen Description URINE, RANDOM  Final   Special Requests NONE  Final   Culture   Final    CULTURE REINCUBATED FOR BETTER GROWTH Performed at [facility], [address], [place], [place] [address]    Report Status PENDING  Incomplete     Time coordinating  discharge:  I have spent 35 minutes face to face with the patient and on the ward discussing the patients care, assessment, plan and disposition with other care givers. >50% of the time was devoted counseling the patient about the risks and benefits of treatment/Discharge disposition and coordinating care.   SIGNED:   Dimple Nanas, MD  [facility] 11/06/2022, 8:03 AM   If 7PM-7AM, please contact night-coverage

## 2022-11-05 NOTE — Progress Notes (Signed)
Speech Language Pathology Treatment: (P) Cognitive-Linquistic  Patient Details Name: Erica Ball MRN: [id number] DOB: [birth year] Today's Date: 11/05/2022 Time: (P) 1221-(P) 1232 SLP Time Calculation (min) (ACUTE ONLY): (P) 11 min  Assessment / Plan / Recommendation Clinical Impression  Pt seen for cognitive intervention sitting up in chair. She needed mild cues fading to none to problem solve how to have get help to her room but independent at end of session. She was able to recall her weight being precaution on her left lower extremity. For anticipatory awareness stated her need to strengthen her left lower extremity and stated she is waiting on a place for discharge for more rehab. Pt may need further ST once at SNF however she appears mostly functional on the acute rehab venue and ST will sign off at this time.    HPI HPI: 72) 72 year old female admitted 6/18 after fall, found to have left tibial plateau fracture and right metatarsal fracture; CT r/o metatarsal fractures on Rt 6/19. Patient developed progressively worsening lethargy prior to discharge. MRI brain was obtained on 6/24, which revealed scattered acute/subacute ischemic infarcts bilaterally. PMHx: alcohol abuse, tobacco abuse, HTN, COPD, mobility issues      SLP Plan  (P) All goals met;Discharge SLP treatment due to (comment)      Recommendations for follow up therapy are one component of a multi-disciplinary discharge planning process, led by the attending physician.  Recommendations may be updated based on patient status, additional functional criteria and insurance authorization.    Recommendations                         (P) Intermittent Supervision/Assistance (P) Cognitive communication deficit (R41.841)     (P) All goals met;Discharge SLP treatment due to (comment)     Erica Ball  11/05/2022, 1:10 PM

## 2022-11-05 NOTE — TOC Progression Note (Signed)
Transition of Care [facility]) - Progression Note    Patient Details  Name: Erica Ball MRN: [id number] Date of Birth: 05-12-1950  Transition of [facility]) CM/SW Contact  Eduard Roux, [place] Phone Number: 11/05/2022, 11:01 AM  Clinical Narrative:     [facility] have availability- CSW will start authorization once patient has been by PT  Antony Blackbird, MSW, LCSW Clinical Social Worker    Expected Discharge Plan: Skilled Nursing Facility Barriers to Discharge: Continued Medical Work up, SNF Pending bed offer  Expected Discharge Plan and Services In-house Referral: Clinical Social Work   Post Acute Care Choice: Skilled Nursing Facility Living arrangements for the past 2 months: Single Family Home                                       Social Determinants of Health (SDOH) Interventions SDOH Screenings   Food Insecurity: No Food Insecurity (10/24/2022)  Housing: Low Risk  (10/24/2022)  Transportation Needs: No Transportation Needs (10/24/2022)  Utilities: Not At Risk (10/24/2022)  Tobacco Use: High Risk (11/01/2022)    Readmission Risk Interventions     No data to display

## 2022-11-06 LAB — CULTURE, OB URINE: Culture: >=100000 — AB

## 2022-11-06 LAB — GLUCOSE, CAPILLARY: Glucose-Capillary: 107 mg/dL — ABNORMAL HIGH (ref 70–99)

## 2022-11-06 MED ORDER — HYDRALAZINE HCL 20 MG/ML IJ SOLN
10.0000 mg | Freq: Once | INTRAMUSCULAR | Status: AC
  Administered 2022-11-06: 10 mg via INTRAVENOUS
  Filled 2022-11-06: qty 1

## 2022-11-06 MED ORDER — CEPHALEXIN 500 MG PO CAPS: 500.0000 mg | ORAL_CAPSULE | Freq: Two times a day (BID) | ORAL | 0 refills | Status: AC

## 2022-11-06 MED ORDER — CEPHALEXIN 500 MG PO CAPS: 500.0000 mg | ORAL_CAPSULE | Freq: Two times a day (BID) | ORAL | 0 refills | Status: DC

## 2022-11-06 MED ORDER — LOSARTAN POTASSIUM 100 MG PO TABS: 100.0000 mg | ORAL_TABLET | Freq: Every day | ORAL | Status: DC

## 2022-11-06 MED ORDER — AMLODIPINE BESYLATE 10 MG PO TABS
10.0000 mg | ORAL_TABLET | Freq: Once | ORAL | Status: AC
  Administered 2022-11-06: 10 mg via ORAL
  Filled 2022-11-06: qty 1

## 2022-11-06 MED ORDER — LOSARTAN POTASSIUM 50 MG PO TABS
100.0000 mg | ORAL_TABLET | Freq: Every day | ORAL | Status: DC
  Administered 2022-11-06: 100 mg via ORAL
  Filled 2022-11-06: qty 2

## 2022-11-06 NOTE — Progress Notes (Signed)
Picked up by [facility] for transport to [place].  Family at bedside.

## 2022-11-06 NOTE — TOC Progression Note (Signed)
Transition of Care [facility]) - Progression Note    Patient Details  Name: Erica Ball MRN: [id number] Date of Birth: 1950/08/24  [facility] Care [facility]) CM/SW Contact  Eduard Roux, [place] Phone Number: 11/06/2022, 10:10 AM  Clinical Narrative:     Received insurance authorization [id number] 7/1-7/3- SNF updated   Expected Discharge Plan: Skilled Nursing Facility Barriers to Discharge: Barriers Resolved  Expected Discharge Plan and Services In-house Referral: Clinical Social Work   Post Acute Care Choice: Skilled Nursing Facility Living arrangements for the past 2 months: Single Family Home Expected Discharge Date: 11/06/22                                     Social Determinants of Health (SDOH) Interventions SDOH Screenings   Food Insecurity: No Food Insecurity (10/24/2022)  Housing: Low Risk  (10/24/2022)  Transportation Needs: No Transportation Needs (10/24/2022)  Utilities: Not At Risk (10/24/2022)  Tobacco Use: High Risk (11/01/2022)    Readmission Risk Interventions     No data to display

## 2022-11-06 NOTE — TOC Progression Note (Signed)
Transition of Care [facility]) - Progression Note    Patient Details  Name: Erica Ball MRN: [id number] Date of Birth: Nov 03, 1950  Transition of Care [facility]) CM/SW Contact  Eduard Roux, [place] Phone Number: 11/06/2022, 4:01 PM  Clinical Narrative:     Informed SNF/[facility] of current BP 136/70-given by RN-  they confirmed they can accept patient.  [facility] called for transport   CSW signing off   Antony Blackbird, MSW, LCSW Clinical Social Worker    Expected Discharge Plan: Skilled Nursing Facility Barriers to Discharge: Barriers Resolved  Expected Discharge Plan and Services In-house Referral: Clinical Social Work   Post Acute Care Choice: Skilled Nursing Facility Living arrangements for the past 2 months: Single Family Home Expected Discharge Date: 11/06/22                                     Social Determinants of Health (SDOH) Interventions SDOH Screenings   Food Insecurity: No Food Insecurity (10/24/2022)  Housing: Low Risk  (10/24/2022)  Transportation Needs: No Transportation Needs (10/24/2022)  Utilities: Not At Risk (10/24/2022)  Tobacco Use: High Risk (11/01/2022)    Readmission Risk Interventions     No data to display

## 2022-11-06 NOTE — Progress Notes (Signed)
seen at bedside, no complaints.  Feeling okay. Elevated blood pressure therefore losartan increased. Discharge summary updated.  Call with further questions as needed Stephania Fragmin MD [facility]

## 2022-11-06 NOTE — TOC Transition Note (Signed)
Transition of Care [facility]) - CM/SW Discharge Note   Patient Details  Name: Erica Ball MRN: [id number] Date of Birth: 21-Feb-1951  [facility]) CM/SW Contact:  Eduard Roux, LCSW Phone Number: 11/06/2022, 11:14 AM   Clinical Narrative:     Patient will Discharge to: [facility] Discharge Date: 11/06/2022 Family Notified: LVM w/ sister Transport By: Sharin Mons  Per MD patient is ready for discharge. RN, patient, and facility notified of discharge. Discharge Summary sent to facility. RN given number for [phone]. Ambulance transport requested for patient.   Clinical Social Worker signing off.   Final next level of care: Skilled Nursing Facility Barriers to Discharge: Barriers Resolved   Patient Goals and CMS Choice CMS Medicare.gov Compare Post Acute Care list provided to:: Patient Choice offered to / list presented to : Patient  Discharge Placement                Patient chooses bed at: [facility] Patient to be transferred to facility by: [facility] Name of family member notified: LVM w/ sister [name] Patient and family notified of of transfer: 11/06/22  Discharge Plan and Services Additional resources added to the After Visit Summary for   In-house Referral: Clinical Social Work   Post Acute Care Choice: Skilled Nursing Facility                               Social Determinants of Health (SDOH) Interventions SDOH Screenings   Food Insecurity: No Food Insecurity (10/24/2022)  Housing: Low Risk  (10/24/2022)  Transportation Needs: No Transportation Needs (10/24/2022)  Utilities: Not At Risk (10/24/2022)  Tobacco Use: High Risk (11/01/2022)     Readmission Risk Interventions     No data to display

## 2022-11-06 NOTE — Plan of Care (Signed)
Discharging to SNF

## 2022-11-06 NOTE — Progress Notes (Signed)
Discharge has been delayed due to BP 211/93.  Notified MD.  Additional BP medications ordered.  Instructed to:  Check in next 1 hr and then in 2 hr. If lower, contact them to see if they will take the patient as planned.

## 2022-11-07 LAB — CULTURE, OB URINE

## 2022-11-16 ENCOUNTER — Inpatient Hospital Stay (HOSPITAL_COMMUNITY)
Admission: EM | Admit: 2022-11-16 | Discharge: 2022-11-29 | DRG: 643 | Disposition: A | Discharge status: Skilled Nursing Facility | Payer: Medicare HMO | Source: Skilled Nursing Facility | Attending: Family Medicine | Admitting: Family Medicine

## 2022-11-16 ENCOUNTER — Other Ambulatory Visit: Payer: Self-pay

## 2022-11-16 ENCOUNTER — Encounter (HOSPITAL_COMMUNITY): Payer: Self-pay

## 2022-11-16 ENCOUNTER — Emergency Department (HOSPITAL_COMMUNITY): Payer: Medicare HMO

## 2022-11-16 DIAGNOSIS — I10 Essential (primary) hypertension: Secondary | ICD-10-CM | POA: Diagnosis present

## 2022-11-16 DIAGNOSIS — R531 Weakness: Secondary | ICD-10-CM

## 2022-11-16 DIAGNOSIS — E860 Dehydration: Secondary | ICD-10-CM | POA: Diagnosis present

## 2022-11-16 DIAGNOSIS — E876 Hypokalemia: Secondary | ICD-10-CM | POA: Diagnosis present

## 2022-11-16 DIAGNOSIS — Z8249 Family history of ischemic heart disease and other diseases of the circulatory system: Secondary | ICD-10-CM | POA: Diagnosis not present

## 2022-11-16 DIAGNOSIS — Z8619 Personal history of other infectious and parasitic diseases: Secondary | ICD-10-CM

## 2022-11-16 DIAGNOSIS — N390 Urinary tract infection, site not specified: Secondary | ICD-10-CM | POA: Diagnosis present

## 2022-11-16 DIAGNOSIS — F17210 Nicotine dependence, cigarettes, uncomplicated: Secondary | ICD-10-CM | POA: Diagnosis present

## 2022-11-16 DIAGNOSIS — F05 Delirium due to known physiological condition: Secondary | ICD-10-CM | POA: Diagnosis not present

## 2022-11-16 DIAGNOSIS — Z681 Body mass index (BMI) 19 or less, adult: Secondary | ICD-10-CM | POA: Diagnosis not present

## 2022-11-16 DIAGNOSIS — R030 Elevated blood-pressure reading, without diagnosis of hypertension: Secondary | ICD-10-CM

## 2022-11-16 DIAGNOSIS — R410 Disorientation, unspecified: Secondary | ICD-10-CM | POA: Diagnosis not present

## 2022-11-16 DIAGNOSIS — Z8781 Personal history of (healed) traumatic fracture: Secondary | ICD-10-CM | POA: Diagnosis not present

## 2022-11-16 DIAGNOSIS — Z90710 Acquired absence of both cervix and uterus: Secondary | ICD-10-CM | POA: Diagnosis not present

## 2022-11-16 DIAGNOSIS — E861 Hypovolemia: Secondary | ICD-10-CM | POA: Diagnosis present

## 2022-11-16 DIAGNOSIS — J449 Chronic obstructive pulmonary disease, unspecified: Secondary | ICD-10-CM | POA: Diagnosis present

## 2022-11-16 DIAGNOSIS — K921 Melena: Secondary | ICD-10-CM | POA: Diagnosis present

## 2022-11-16 DIAGNOSIS — E222 Syndrome of inappropriate secretion of antidiuretic hormone: Principal | ICD-10-CM | POA: Diagnosis present

## 2022-11-16 DIAGNOSIS — I471 Supraventricular tachycardia, unspecified: Secondary | ICD-10-CM | POA: Diagnosis present

## 2022-11-16 DIAGNOSIS — E785 Hyperlipidemia, unspecified: Secondary | ICD-10-CM | POA: Diagnosis present

## 2022-11-16 DIAGNOSIS — Z7982 Long term (current) use of aspirin: Secondary | ICD-10-CM | POA: Diagnosis not present

## 2022-11-16 DIAGNOSIS — Z79899 Other long term (current) drug therapy: Secondary | ICD-10-CM | POA: Diagnosis not present

## 2022-11-16 DIAGNOSIS — Z8701 Personal history of pneumonia (recurrent): Secondary | ICD-10-CM

## 2022-11-16 DIAGNOSIS — E871 Hypo-osmolality and hyponatremia: Principal | ICD-10-CM

## 2022-11-16 DIAGNOSIS — E43 Unspecified severe protein-calorie malnutrition: Secondary | ICD-10-CM | POA: Diagnosis present

## 2022-11-16 DIAGNOSIS — Z888 Allergy status to other drugs, medicaments and biological substances status: Secondary | ICD-10-CM

## 2022-11-16 LAB — CBC
HCT: 34.7 % — ABNORMAL LOW (ref 36.0–46.0)
Hemoglobin: 12.4 g/dL (ref 12.0–15.0)
MCH: 33.3 pg (ref 26.0–34.0)
MCHC: 35.7 g/dL (ref 30.0–36.0)
MCV: 93.3 fL (ref 80.0–100.0)
Platelets: 375 10*3/uL (ref 150–400)
RBC: 3.72 MIL/uL — ABNORMAL LOW (ref 3.87–5.11)
RDW: 11.5 % (ref 11.5–15.5)
WBC: 8.9 10*3/uL (ref 4.0–10.5)
nRBC: 0 % (ref 0.0–0.2)

## 2022-11-16 LAB — URINALYSIS, ROUTINE W REFLEX MICROSCOPIC
Bilirubin Urine: NEGATIVE
Glucose, UA: NEGATIVE mg/dL
Ketones, ur: NEGATIVE mg/dL
Nitrite: POSITIVE — AB
Protein, ur: NEGATIVE mg/dL
Specific Gravity, Urine: 1.003 — ABNORMAL LOW (ref 1.005–1.030)
pH: 6 (ref 5.0–8.0)

## 2022-11-16 LAB — COMPREHENSIVE METABOLIC PANEL
ALT: 14 U/L (ref 0–44)
AST: 17 U/L (ref 15–41)
Albumin: 3.5 g/dL (ref 3.5–5.0)
Alkaline Phosphatase: 50 U/L (ref 38–126)
Anion gap: 9 (ref 5–15)
BUN: 7 mg/dL — ABNORMAL LOW (ref 8–23)
CO2: 26 mmol/L (ref 22–32)
Calcium: 8.7 mg/dL — ABNORMAL LOW (ref 8.9–10.3)
Chloride: 84 mmol/L — ABNORMAL LOW (ref 98–111)
Creatinine, Ser: 0.61 mg/dL (ref 0.44–1.00)
GFR, Estimated: >60 mL/min (ref >60)
Glucose, Bld: 106 mg/dL — ABNORMAL HIGH (ref 70–99)
Potassium: 3.8 mmol/L (ref 3.5–5.1)
Sodium: 119 mmol/L — CL (ref 135–145)
Total Bilirubin: 0.6 mg/dL (ref 0.3–1.2)
Total Protein: 6.3 g/dL — ABNORMAL LOW (ref 6.5–8.1)

## 2022-11-16 LAB — OSMOLALITY, URINE: Osmolality, Ur: 124 mOsm/kg — ABNORMAL LOW (ref 300–900)

## 2022-11-16 LAB — SODIUM, URINE, RANDOM: Sodium, Ur: 17 mmol/L

## 2022-11-16 LAB — OSMOLALITY: Osmolality: 248 mOsm/kg — CL (ref 275–295)

## 2022-11-16 MED ORDER — SODIUM CHLORIDE 0.9 % IV BOLUS
1000.0000 mL | Freq: Once | INTRAVENOUS | Status: AC
  Administered 2022-11-16: 1000 mL via INTRAVENOUS

## 2022-11-16 MED ORDER — ENOXAPARIN SODIUM 40 MG/0.4ML IJ SOSY
40.0000 mg | PREFILLED_SYRINGE | INTRAMUSCULAR | Status: DC
  Administered 2022-11-16 – 2022-11-28 (×13): 40 mg via SUBCUTANEOUS
  Filled 2022-11-16 (×13): qty 0.4

## 2022-11-16 MED ORDER — ROSUVASTATIN CALCIUM 5 MG PO TABS
10.0000 mg | ORAL_TABLET | Freq: Every day | ORAL | Status: DC
  Administered 2022-11-16 – 2022-11-29 (×14): 10 mg via ORAL
  Filled 2022-11-16 (×14): qty 2

## 2022-11-16 MED ORDER — ALBUTEROL SULFATE HFA 108 (90 BASE) MCG/ACT IN AERS: 1.0000 | INHALATION_SPRAY | RESPIRATORY_TRACT | Status: DC | PRN

## 2022-11-16 MED ORDER — POLYETHYLENE GLYCOL 3350 17 G PO PACK: 17.0000 g | PACK | Freq: Every day | ORAL | Status: DC | PRN

## 2022-11-16 MED ORDER — SODIUM CHLORIDE 0.9 % IV SOLN: INTRAVENOUS | Status: DC

## 2022-11-16 MED ORDER — SODIUM CHLORIDE 0.9 % IV SOLN
2.0000 g | INTRAVENOUS | Status: AC
  Administered 2022-11-17 – 2022-11-19 (×3): 2 g via INTRAVENOUS
  Filled 2022-11-16 (×3): qty 20

## 2022-11-16 MED ORDER — FOLIC ACID 1 MG PO TABS
1.0000 mg | ORAL_TABLET | Freq: Every day | ORAL | Status: DC
  Administered 2022-11-16 – 2022-11-29 (×14): 1 mg via ORAL
  Filled 2022-11-16 (×14): qty 1

## 2022-11-16 MED ORDER — METOPROLOL SUCCINATE ER 25 MG PO TB24
12.5000 mg | ORAL_TABLET | Freq: Every day | ORAL | Status: DC
  Administered 2022-11-16 – 2022-11-17 (×2): 12.5 mg via ORAL
  Filled 2022-11-16 (×2): qty 1

## 2022-11-16 MED ORDER — SODIUM CHLORIDE 0.9 % IV SOLN
1.0000 g | Freq: Once | INTRAVENOUS | Status: AC
  Administered 2022-11-16: 1 g via INTRAVENOUS
  Filled 2022-11-16: qty 10

## 2022-11-16 MED ORDER — THIAMINE MONONITRATE 100 MG PO TABS
100.0000 mg | ORAL_TABLET | Freq: Every day | ORAL | Status: DC
  Administered 2022-11-16 – 2022-11-22 (×7): 100 mg via ORAL
  Filled 2022-11-16 (×7): qty 1

## 2022-11-16 MED ORDER — ACETAMINOPHEN 325 MG PO TABS
650.0000 mg | ORAL_TABLET | Freq: Four times a day (QID) | ORAL | Status: DC | PRN
  Administered 2022-11-17 – 2022-11-22 (×2): 650 mg via ORAL
  Filled 2022-11-16 (×2): qty 2

## 2022-11-16 MED ORDER — MELATONIN 5 MG PO TABS
5.0000 mg | ORAL_TABLET | Freq: Every evening | ORAL | Status: DC | PRN
  Administered 2022-11-18 – 2022-11-21 (×3): 5 mg via ORAL
  Filled 2022-11-16 (×3): qty 1

## 2022-11-16 MED ORDER — PROCHLORPERAZINE EDISYLATE 10 MG/2ML IJ SOLN
5.0000 mg | Freq: Four times a day (QID) | INTRAMUSCULAR | Status: DC | PRN
  Administered 2022-11-19: 5 mg via INTRAVENOUS
  Filled 2022-11-16: qty 2

## 2022-11-16 NOTE — ED Notes (Signed)
Patient placed onto bedpan

## 2022-11-16 NOTE — ED Notes (Signed)
Pt refusing to let this RN start an IV or draw labs, EDP informed.

## 2022-11-16 NOTE — ED Triage Notes (Signed)
Pt BIB [facility] from [facility] SNF d/t her blood work there resulting with her Na at 120. Pt states she does not feel like herself & EMS unable to report her orientation baseline, but does report there is confusion on board. She does have a baseline speech deficit. 138/92, 60 bpm NSR, CBG 120, Full Code.

## 2022-11-16 NOTE — ED Provider Notes (Signed)
[place] [facility]GENCY DEPARTMENT AT [facility] Provider Note   CSN: [id number] Arrival date & time: 11/16/22  1611     History  Chief Complaint  Patient presents with   Low Na   Confusion    EMILYE FUREY is a 72 y.o. female.  Pt with generalized weakness, presents via EMS from SNF with low na on labs today ?120.  Pt limited historian - level 5 caveat. ?recent poor po intake. Denies excessive water ingestion. No fevers. No focal or specific pain. No headache. No chest pain. No abd pain. No nvd. No dysuria.   The history is provided by the patient, medical records and the EMS personnel. The history is limited by the condition of the patient.       Home Medications Prior to Admission medications   Medication Sig Start Date End Date Taking? Authorizing Provider  albuterol (PROVENTIL HFA;VENTOLIN HFA) 108 (90 Base) MCG/ACT inhaler Inhale 1-2 puffs into the lungs every 4 (four) hours as needed for shortness of breath. 07/10/17   [provider]  aspirin EC 81 MG tablet Take 1 tablet (81 mg total) by mouth daily. Patient not taking: Reported on 10/23/2022 12/06/17   Wendall Stade, MD  EPINEPHrine (PRIMATENE MIST IN) Inhale 1 puff into the lungs daily as needed (congestion).    [provider]  folic acid (FOLVITE) 1 MG tablet Take 1 tablet (1 mg total) by mouth daily. 11/05/22   Cathleen Corti, MD  losartan (COZAAR) 100 MG tablet Take 1 tablet (100 mg total) by mouth daily. 11/06/22   Amin, Loura Halt, MD  metoprolol succinate (TOPROL XL) 100 MG 24 hr tablet Take 1 tablet (100 mg total) by mouth daily. Take with or immediately following a meal. 11/04/22 11/04/23  Cathleen Corti, MD  oxycodone (OXY-IR) 5 MG capsule Take 1 capsule (5 mg total) by mouth every 6 (six) hours as needed for pain. 11/05/22   Amin, Loura Halt, MD  rosuvastatin (CRESTOR) 10 MG tablet Take 1 tablet (10 mg total) by mouth daily. 11/05/22   Cathleen Corti, MD  thiamine  (VITAMIN B-1) 100 MG tablet Take 1 tablet (100 mg total) by mouth daily. 11/05/22   Cathleen Corti, MD      Allergies    Dextromethorphan    Review of Systems   Review of Systems  Constitutional:  Negative for chills and fever.  HENT:  Negative for sore throat.   Eyes:  Negative for visual disturbance.  Respiratory:  Negative for shortness of breath.   Cardiovascular:  Negative for chest pain.  Gastrointestinal:  Negative for abdominal pain, diarrhea and vomiting.  Genitourinary:  Negative for dysuria and flank pain.  Musculoskeletal:  Negative for back pain and neck pain.  Skin:  Negative for rash.  Neurological:  Negative for headaches.  Hematological:  Does not bruise/bleed easily.  Psychiatric/Behavioral:  Positive for confusion.     Physical Exam Updated Vital Signs BP (!) 178/98   Pulse 64   Temp 97.6 F (36.4 C)   Resp 14   SpO2 100%  Physical Exam Vitals and nursing note reviewed.  Constitutional:      Appearance: Normal appearance. She is well-developed.  HENT:     Head: Atraumatic.     Nose: Nose normal.     Mouth/Throat:     Mouth: Mucous membranes are moist.  Eyes:     General: No scleral icterus.    Conjunctiva/sclera: Conjunctivae normal.  Pupils: Pupils are equal, round, and reactive to light.  Neck:     Vascular: No carotid bruit.     Trachea: No tracheal deviation.  Cardiovascular:     Rate and Rhythm: Normal rate and regular rhythm.     Pulses: Normal pulses.     Heart sounds: Normal heart sounds. No murmur heard.    No friction rub. No gallop.  Pulmonary:     Effort: Pulmonary effort is normal. No respiratory distress.     Breath sounds: Normal breath sounds.  Abdominal:     General: Bowel sounds are normal. There is no distension.     Palpations: Abdomen is soft.     Tenderness: There is no abdominal tenderness.  Genitourinary:    Comments: No cva tenderness.  Musculoskeletal:        General: No swelling or tenderness.      Cervical back: Normal range of motion and neck supple. No rigidity or tenderness. No muscular tenderness.  Skin:    General: Skin is warm and dry.     Findings: No rash.  Neurological:     Mental Status: She is alert.     Comments: Alert, speech normal. Motor/sens grossly intact bil.   Psychiatric:        Mood and Affect: Mood normal.     ED Results / Procedures / Treatments   Labs (all labs ordered are listed, but only abnormal results are displayed) Results for orders placed or performed during the hospital encounter of 11/16/22  CBC  Result Value Ref Range   WBC 8.9 4.0 - 10.5 K/uL   RBC 3.72 (L) 3.87 - 5.11 MIL/uL   Hemoglobin 12.4 12.0 - 15.0 g/dL   HCT 19.1 (L) 47.8 - 29.5 %   MCV 93.3 80.0 - 100.0 fL   MCH 33.3 26.0 - 34.0 pg   MCHC 35.7 30.0 - 36.0 g/dL   RDW 62.1 30.8 - 65.7 %   Platelets 375 150 - 400 K/uL   nRBC 0.0 0.0 - 0.2 %  Comprehensive metabolic panel  Result Value Ref Range   Sodium 119 (LL) 135 - 145 mmol/L   Potassium 3.8 3.5 - 5.1 mmol/L   Chloride 84 (L) 98 - 111 mmol/L   CO2 26 22 - 32 mmol/L   Glucose, Bld 106 (H) 70 - 99 mg/dL   BUN 7 (L) 8 - 23 mg/dL   Creatinine, Ser 8.46 0.44 - 1.00 mg/dL   Calcium 8.7 (L) 8.9 - 10.3 mg/dL   Total Protein 6.3 (L) 6.5 - 8.1 g/dL   Albumin 3.5 3.5 - 5.0 g/dL   AST 17 15 - 41 U/L   ALT 14 0 - 44 U/L   Alkaline Phosphatase 50 38 - 126 U/L   Total Bilirubin 0.6 0.3 - 1.2 mg/dL   GFR, Estimated >96 >29 mL/min   Anion gap 9 5 - 15  Osmolality  Result Value Ref Range   Osmolality 248 (LL) 275 - 295 mOsm/kg   DG Chest Port 1 View  Result Date: 11/16/2022 CLINICAL DATA:  Weakness EXAM: PORTABLE CHEST 1 VIEW COMPARISON:  08/06/2017 FINDINGS: Chronic scarring at the right base. No acute airspace disease. Stable cardiomediastinal silhouette with aortic atherosclerosis.vague foci of opacity overlying right second anterior rib end as well as the left mid to lower thorax, question healing rib fractures. IMPRESSION: 1.  No active disease. Chronic scarring at the right base. 2. Vague foci of bilateral opacity, question healing rib fractures. Consider correlation with chest  CT to exclude lung parenchymal abnormality. Electronically Signed   By: Jasmine Pang M.D.   On: 11/16/2022 17:37   EEG adult  Result Date: 10/31/2022 Charlsie Quest, MD     10/31/2022 11:04 AM Patient Name: KALISA LAPIDES MRN: [id number] Epilepsy Attending: Charlsie Quest Referring Physician/Provider: Marvel Plan, MD Date: 10/31/2022 Duration: 22.55 mins Patient history: 72yo F with ams getting eeg to evaluate for seizure. Level of alertness: Awake AEDs during EEG study: None Technical aspects: This EEG study was done with scalp electrodes positioned according to the 10-20 International system of electrode placement. Electrical activity was reviewed with band pass filter of 1-70Hz , sensitivity of 7 uV/mm, display speed of 4mm/sec with a 60Hz  notched filter applied as appropriate. EEG data were recorded continuously and digitally stored.  Video monitoring was available and reviewed as appropriate. Description: No clear posterior dominant rhythm was seen. EEG also showed continuous generalized and lateralized right hemisphere mixed frequencies with predominantly 6 to 9 Hz theta-alpha activity admixed with intermittent generalized 2 to 3 Hz delta slowing.  Physiologic photic driving was not seen during photic stimulation. Hyperventilation was not performed.   ABNORMALITY - Continuous slow, generalized and lateralized right hemisphere IMPRESSION: This study is suggestive of cortical dysfunction arising from right hemisphere likely secondary to underlying structural abnormality. Additionally there is moderate diffuse encephalopathy. No seizures or epileptiform discharges were seen throughout the recording. Charlsie Quest   ECHOCARDIOGRAM COMPLETE  Result Date: 10/30/2022    ECHOCARDIOGRAM REPORT   Patient Name:   ALAIYNA MOESSNER Pellicano Date of Exam:  10/30/2022 Medical Rec #:  [id number]         Height:       68.0 in Accession #:    [id number]        Weight:       140.0 lb Date of Birth:  1950/08/15        BSA:          1.756 m Patient Age:    18 years          BP:           150/84 mmHg Patient Gender: F                 HR:           80 bpm. Exam Location:  Inpatient Procedure: 2D Echo, Color Doppler and Cardiac Doppler Indications:    CVA  History:        Patient has prior history of Echocardiogram examinations. COPD;                 Risk Factors:Hypertension and Current Smoker. Sepsis. ETOH                 abuse.  Sonographer:    Aron Baba Referring Phys: [phone] RALPH A NETTEY IMPRESSIONS  1. Left ventricular ejection fraction, by estimation, is 60 to 65%. The left ventricle has normal function. The left ventricle has no regional wall motion abnormalities. Left ventricular diastolic parameters are consistent with Grade I diastolic dysfunction (impaired relaxation).  2. Right ventricular systolic function is normal. The right ventricular size is normal.  3. Left atrial size was mildly dilated.  4. The mitral valve is normal in structure. No evidence of mitral valve regurgitation. No evidence of mitral stenosis.  5. The aortic valve is normal in structure. Aortic valve regurgitation is mild to moderate. No aortic stenosis is present.  6. The inferior vena cava is normal in  size with greater than 50% respiratory variability, suggesting right atrial pressure of 3 mmHg. FINDINGS  Left Ventricle: Left ventricular ejection fraction, by estimation, is 60 to 65%. The left ventricle has normal function. The left ventricle has no regional wall motion abnormalities. The left ventricular internal cavity size was normal in size. There is  no left ventricular hypertrophy. Left ventricular diastolic parameters are consistent with Grade I diastolic dysfunction (impaired relaxation). Right Ventricle: The right ventricular size is normal. No increase in right ventricular wall  thickness. Right ventricular systolic function is normal. Left Atrium: Left atrial size was mildly dilated. Right Atrium: Right atrial size was normal in size. Pericardium: There is no evidence of pericardial effusion. Mitral Valve: The mitral valve is normal in structure. No evidence of mitral valve regurgitation. No evidence of mitral valve stenosis. Tricuspid Valve: The tricuspid valve is normal in structure. Tricuspid valve regurgitation is mild . No evidence of tricuspid stenosis. Aortic Valve: The aortic valve is normal in structure. Aortic valve regurgitation is mild to moderate. Aortic regurgitation PHT measures 677 msec. No aortic stenosis is present. Pulmonic Valve: The pulmonic valve was normal in structure. Pulmonic valve regurgitation is not visualized. No evidence of pulmonic stenosis. Aorta: The aortic root is normal in size and structure. Venous: The inferior vena cava is normal in size with greater than 50% respiratory variability, suggesting right atrial pressure of 3 mmHg. IAS/Shunts: No atrial level shunt detected by color flow Doppler.  LEFT VENTRICLE PLAX 2D LVIDd:         4.40 cm   Diastology LVIDs:         2.90 cm   LV e' medial:    5.33 cm/s LV PW:         0.60 cm   LV E/e' medial:  13.7 LV IVS:        0.90 cm   LV e' lateral:   8.59 cm/s LVOT diam:     1.90 cm   LV E/e' lateral: 8.5 LV SV:         71 LV SV Index:   41 LVOT Area:     2.84 cm  RIGHT VENTRICLE RV S prime:     15.40 cm/s TAPSE (M-mode): 2.4 cm LEFT ATRIUM             Index        RIGHT ATRIUM           Index LA Vol (A2C):   74.8 ml 42.59 ml/m  RA Area:     12.70 cm LA Vol (A4C):   56.0 ml 31.89 ml/m  RA Volume:   26.00 ml  14.80 ml/m LA Biplane Vol: 70.7 ml 40.26 ml/m  AORTIC VALVE             PULMONIC VALVE LVOT Vmax:   129.00 cm/s PR End Diast Vel: 11.16 msec LVOT Vmean:  83.300 cm/s LVOT VTI:    0.252 m AI PHT:      677 msec  AORTA Ao Root diam: 3.50 cm Ao Asc diam:  3.20 cm MITRAL VALVE                TRICUSPID VALVE  MV Area (PHT): 3.77 cm     TR Peak grad:   26.8 mmHg MV Decel Time: 201 msec     TR Vmax:        259.00 cm/s MV E velocity: 73.20 cm/s MV A velocity: 148.00 cm/s  SHUNTS MV E/A ratio:  0.49  Systemic VTI:  0.25 m                             Systemic Diam: 1.90 cm Lavona Mound Tobb DO Electronically signed by Thomasene Ripple DO Signature Date/Time: 10/30/2022/3:20:07 PM    Final    CT ANGIO HEAD NECK W WO CM  Result Date: 10/30/2022 CLINICAL DATA:  Scattered acute and subacute infarcts on 10/29/2022 MRI, determine embolic source EXAM: CT ANGIOGRAPHY HEAD AND NECK WITH AND WITHOUT CONTRAST TECHNIQUE: Multidetector CT imaging of the head and neck was performed using the standard protocol during bolus administration of intravenous contrast. Multiplanar CT image reconstructions and MIPs were obtained to evaluate the vascular anatomy. Carotid stenosis measurements (when applicable) are obtained utilizing NASCET criteria, using the distal internal carotid diameter as the denominator. RADIATION DOSE REDUCTION: This exam was performed according to the departmental dose-optimization program which includes automated exposure control, adjustment of the mA and/or kV according to patient size and/or use of iterative reconstruction technique. CONTRAST:  75mL OMNIPAQUE IOHEXOL 350 MG/ML SOLN COMPARISON:  No prior CTA available, correlation is made with CT head 10/28/2022 and MRI 10/29/2022 FINDINGS: CT HEAD FINDINGS Paste that Brain: No evidence of acute hemorrhage, mass, mass effect, or midline shift. No hydrocephalus or extra-axial fluid collection. Redemonstrated scattered hypodense foci in the periventricular and subcortical white matter, some of which correlate with the acute and subacute infarcts seen on the 10/29/2022 MRI, although there is also a background of moderate chronic small vessel ischemic disease. Vascular: No hyperdense vessel. Atherosclerotic calcifications in the intracranial carotid and vertebral arteries.  Skull: Negative for fracture or focal lesion. Sinuses/Orbits: Mucosal thickening in the ethmoid air cells and right sphenoid sinus. Status post bilateral lens replacements. Other: The mastoid air cells are well aerated. CTA NECK FINDINGS Aortic arch: Standard branching. Imaged portion shows no evidence of aneurysm or dissection. No significant stenosis of the major arch vessel origins. Aortic atherosclerosis. Right carotid system: No evidence of dissection, occlusion, or hemodynamically significant stenosis (greater than 50%). Atherosclerotic disease at the bifurcation and in the proximal ICA is not hemodynamically significant. Left carotid system: No evidence of dissection, occlusion, or hemodynamically significant stenosis (greater than 50%). Atherosclerotic disease at the bifurcation and in the proximal ICA is not hemodynamically significant. Vertebral arteries: Mild stenosis in the proximal right V1. The vertebral arteries are otherwise patent to the skull base, without significant stenosis, dissection, or occlusion. Skeleton: Negative. Other neck: Emphysema. No focal pulmonary opacity or pleural effusion. Upper chest: No focal pulmonary opacity or pleural effusion. Review of the MIP images confirms the above findings CTA HEAD FINDINGS Anterior circulation: Both internal carotid arteries are patent to the termini, with calcifications but without significant stenosis. A1 segments patent. Normal anterior communicating artery. Anterior cerebral arteries are patent to their distal aspects without significant stenosis. No M1 stenosis or occlusion. MCA branches perfused to their distal aspects without significant stenosis. Posterior circulation: Vertebral arteries patent to the vertebrobasilar junction, with mild stenosis in the distal right V4, after the PICA takeoff. Left dominant system. Posterior inferior cerebellar arteries patent proximally. Basilar patent to its distal aspect without significant stenosis.  Superior cerebellar arteries patent proximally. Patent left P1. Fetal origin of the right PCA from the right posterior communicating artery. PCAs perfused to their distal aspects without significant stenosis. The left posterior communicating artery is also patent. Venous sinuses: Not well opacified due to phase of contrast. Anatomic variants: Fetal origin of the right  PCA. Review of the MIP images confirms the above findings IMPRESSION: 1. No intracranial large vessel occlusion. Mild stenosis in the distal right V4. 2. Mild stenosis in the proximal right V1. No other hemodynamically significant stenosis in the neck. 3. Aortic atherosclerosis and emphysema. Aortic Atherosclerosis (ICD10-I70.0) and Emphysema (ICD10-J43.9). Electronically Signed   By: Wiliam Ke M.D.   On: 10/30/2022 11:41   MR BRAIN WO CONTRAST  Result Date: 10/29/2022 CLINICAL DATA:  Mental status change, unknown cause. EXAM: MRI HEAD WITHOUT CONTRAST TECHNIQUE: Multiplanar, multiecho pulse sequences of the brain and surrounding structures were obtained without intravenous contrast. COMPARISON:  CT head without contrast 10/28/2022 FINDINGS: Brain: Scattered small foci of restricted diffusion present the subcortical white matter bilaterally. The largest lesion is in the left corona radiata to the external capsule where a cylindrical area restricted diffusion is present. The areas in question all demonstrate T2 and FLAIR hyperintensities remote lacunar infarcts are present within the thalami bilaterally. Remote lacunar infarcts are present within the right posterolateral pons extending into the middle cerebellar peduncle. Remote lacunar infarct is present in the left cerebellum. Mild generalized atrophy is present. The ventricles are proportionate to the degree of atrophy. No significant extraaxial fluid collection is present. Scattered foci of susceptibility are present throughout the white matter both above and below the tentorium. Vascular:  Flow is present in the major intracranial arteries. Skull and upper cervical spine: The craniocervical junction is normal. Upper cervical spine is within normal limits. Marrow signal is unremarkable. Sinuses/Orbits: A left mastoid effusion is present. No obstructing nasopharyngeal lesion is present. Bilateral lens replacements are noted. Globes and orbits are otherwise unremarkable. IMPRESSION: 1. Scattered small foci of restricted diffusion present the subcortical white matter bilaterally. The largest lesion is in the left corona radiata to the external capsule where a cylindrical area of restricted diffusion is present. Findings are consistent with acute/subacute nonhemorrhagic infarcts. This suggests a central embolic source or small vessel microangiopathy. 2. Remote lacunar infarcts of the right posterolateral pons extending into the middle cerebellar peduncle. 3. Remote lacunar infarcts of the thalami bilaterally. 4. Remote lacunar infarct of the left cerebellum. 5. Scattered foci of susceptibility throughout the white matter both above and below the tentorium. This is also consistent with microvascular angiopathy, potentially amyloid angiopathy 6. Left mastoid effusion. No obstructing nasopharyngeal lesion is present. Electronically Signed   By: Marin Roberts M.D.   On: 10/29/2022 16:07   CT ANKLE LEFT WO CONTRAST  Result Date: 10/28/2022 CLINICAL DATA:  Ankle trauma, fracture, xray done (Age >= 5y) EXAM: CT OF THE LEFT ANKLE WITHOUT CONTRAST TECHNIQUE: Multidetector CT imaging of the left ankle was performed according to the standard protocol. Multiplanar CT image reconstructions were also generated. RADIATION DOSE REDUCTION: This exam was performed according to the departmental dose-optimization program which includes automated exposure control, adjustment of the mA and/or kV according to patient size and/or use of iterative reconstruction technique. COMPARISON:  X-ray 10/28/2022 FINDINGS:  Bones/Joint/Cartilage Diffuse osseous demineralization. Subacute-appearing healing nondisplaced fracture of the lateral malleolus (series 4, images 37-39). No medial or posterior malleolar fracture. Ankle mortise is congruent without dislocation. Bones of the hindfoot are intact. Joint spaces are relatively well preserved. No erosion. No bone lesion. Ligaments Suboptimally assessed by CT. Muscles and Tendons No acute musculotendinous abnormality by CT. Soft tissues Soft tissue swelling most pronounced at the lateral aspect of the ankle. No organized hematoma. IMPRESSION: 1. Subacute-appearing healing nondisplaced fracture of the lateral malleolus. 2. Soft tissue swelling most pronounced at  the lateral aspect of the ankle. Electronically Signed   By: Duanne Guess D.O.   On: 10/28/2022 16:30   CT HEAD WO CONTRAST ( )  Result Date: 10/28/2022 CLINICAL DATA:  Altered level of consciousness EXAM: CT HEAD WITHOUT CONTRAST TECHNIQUE: Contiguous axial images were obtained from the base of the skull through the vertex without intravenous contrast. RADIATION DOSE REDUCTION: This exam was performed according to the departmental dose-optimization program which includes automated exposure control, adjustment of the mA and/or kV according to patient size and/or use of iterative reconstruction technique. COMPARISON:  03/08/2006 FINDINGS: Brain: Scattered hypodensities are seen throughout the periventricular and subcortical white matter and right-sided pons, consistent with age-indeterminate small vessel ischemic changes. No other signs of acute infarct or hemorrhage. Lateral ventricles and midline structures are unremarkable. No acute extra-axial fluid collections. No mass effect. Vascular: No hyperdense vessel or unexpected calcification. Skull: Normal. Negative for fracture or focal lesion. Sinuses/Orbits: No acute finding. Other: None. IMPRESSION: 1. Age-indeterminate small-vessel ischemic changes throughout the  periventricular and subcortical white matter and right-sided pons, favor chronic. 2. No acute hemorrhage. Electronically Signed   By: Sharlet Salina M.D.   On: 10/28/2022 14:38   DG Ankle Complete Left  Result Date: 10/28/2022 CLINICAL DATA:  Fall with ankle swelling EXAM: LEFT ANKLE COMPLETE - 3 VIEW COMPARISON:  None Available. FINDINGS: Apparent cortical discontinuity along the lateral and posterior aspect of the distal fibular diaphysis. Diffuse osteopenia. Diffuse soft tissue edema about the ankle. IMPRESSION: Apparent cortical discontinuity along the lateral and posterior aspect of the distal fibular diaphysis, which may represent a nondisplaced fracture. Electronically Signed   By: Agustin Cree M.D.   On: 10/28/2022 13:01   CT Foot Right Wo Contrast  Result Date: 10/23/2022 CLINICAL DATA:  Fall and right foot pain. EXAM: CT OF THE RIGHT FOOT WITHOUT CONTRAST TECHNIQUE: Multidetector CT imaging of the right foot was performed according to the standard protocol. Multiplanar CT image reconstructions were also generated. RADIATION DOSE REDUCTION: This exam was performed according to the departmental dose-optimization program which includes automated exposure control, adjustment of the mA and/or kV according to patient size and/or use of iterative reconstruction technique. COMPARISON:  Right foot radiograph dated 10/23/2022. FINDINGS: Bones/Joint/Cartilage Evaluation of the bones is very limited due to advanced osteoporosis. No acute fracture or dislocation. MRI or bone scan may provide better evaluation if there is high clinical concern for acute fracture. Prior internal fixation of the lateral and medial malleoli. Ligaments Suboptimally assessed by CT. Muscles and Tendons No acute findings. Soft tissues There is mild subcutaneous edema of the dorsum of the foot. IMPRESSION: 1. No acute fracture or dislocation. 2. Advanced osteoporosis. Electronically Signed   By: Elgie Collard M.D.   On: 10/23/2022 22:23    CT Knee Left Wo Contrast  Result Date: 10/23/2022 CLINICAL DATA:  Tibial plateau fracture EXAM: CT OF THE LEFT KNEE WITHOUT CONTRAST TECHNIQUE: Multidetector CT imaging of the left knee was performed according to the standard protocol. Multiplanar CT image reconstructions were also generated. RADIATION DOSE REDUCTION: This exam was performed according to the departmental dose-optimization program which includes automated exposure control, adjustment of the mA and/or kV according to patient size and/or use of iterative reconstruction technique. COMPARISON:  None Available. FINDINGS: Bones/Joint/Cartilage The bones are diffusely osteopenic. There is an acute minimally depressed (3 mm) medial tibial plateau fracture. Acute fracture involves the posterior aspect of the tibial spines. This is nondisplaced. There is minimal medial and lateral compartment joint space narrowing with  chondrocalcinosis. Large knee joint effusion is present with fat fluid levels. Ligaments Suboptimally assessed by CT. Muscles and Tendons Grossly intact, but well evaluated on noncontrast CT. Soft tissues There is mild subcutaneous edema surrounding the knee. Peripheral vascular calcifications are present. IMPRESSION: 1. Acute minimally depressed medial tibial plateau fracture. 2. Acute nondisplaced fracture of the posterior aspect of the tibial spines. 3. Large lipohemarthrosis. Electronically Signed   By: Darliss Cheney M.D.   On: 10/23/2022 22:22   DG Foot Complete Left  Result Date: 10/23/2022 CLINICAL DATA:  Fall with foot pain EXAM: LEFT FOOT - COMPLETE 3+ VIEW COMPARISON:  None Available. FINDINGS: Osteopenia limits fracture assessment. Hallux valgus deformity at the first MTP joint with slight lateral subluxation base of first proximal phalanx. Mild degenerative change at the first MTP joint. Dorsal soft tissue swelling. IMPRESSION: Osteopenia limits fracture assessment. No definite acute osseous abnormality. Dorsal soft tissue  swelling. Electronically Signed   By: Jasmine Pang M.D.   On: 10/23/2022 19:05   DG Foot Complete Right  Result Date: 10/23/2022 CLINICAL DATA:  Fall with pain EXAM: RIGHT FOOT COMPLETE - 3+ VIEW COMPARISON:  None Available. FINDINGS: Bones appear osteopenic which limits fracture assessment. Surgical plate and fixating screws in the distal fibula with fixating screws at the distal tibia. No malalignment. Acute appearing fractures involving the necks of the right third and fourth metatarsals. IMPRESSION: Acute appearing fractures involving the necks of the right third and fourth metatarsals. Electronically Signed   By: Jasmine Pang M.D.   On: 10/23/2022 19:02   DG Knee Complete 4 Views Left  Result Date: 10/23/2022 CLINICAL DATA:  Fall with knee pain EXAM: LEFT KNEE - COMPLETE 4+ VIEW COMPARISON:  None Available. FINDINGS: Bones appear osteopenic. No malalignment. Moderate knee effusion. Acute nondisplaced intra-articular fracture involving the proximal tibia with lucency to the medial tibial plateau. IMPRESSION: 1. Acute nondisplaced intra-articular fracture involving the proximal tibia with lucency extending to the medial tibial plateau. 2. Moderate knee effusion. Electronically Signed   By: Jasmine Pang M.D.   On: 10/23/2022 19:01    EKG EKG Interpretation Date/Time:  Friday November 16 2022 16:22:40 EDT Ventricular Rate:  60 PR Interval:  204 QRS Duration:  103 QT Interval:  437 QTC Calculation: 437 R Axis:   21  Text Interpretation: Sinus rhythm Confirmed by Cathren Laine ([id number]) on 11/16/2022 4:49:45 PM  Radiology DG Chest Port 1 View  Result Date: 11/16/2022 CLINICAL DATA:  Weakness EXAM: PORTABLE CHEST 1 VIEW COMPARISON:  08/06/2017 FINDINGS: Chronic scarring at the right base. No acute airspace disease. Stable cardiomediastinal silhouette with aortic atherosclerosis.vague foci of opacity overlying right second anterior rib end as well as the left mid to lower thorax, question healing  rib fractures. IMPRESSION: 1. No active disease. Chronic scarring at the right base. 2. Vague foci of bilateral opacity, question healing rib fractures. Consider correlation with chest CT to exclude lung parenchymal abnormality. Electronically Signed   By: Jasmine Pang M.D.   On: 11/16/2022 17:37    Procedures Procedures    Medications Ordered in ED Medications  sodium chloride 0.9 % bolus 1,000 mL (1,000 mLs Intravenous New Bag/Given 11/16/22 1832)    ED Course/ Medical Decision Making/ A&P                             Medical Decision Making Problems Addressed: Confusion: acute illness or injury with systemic symptoms that poses a threat to life or  bodily functions Elevated blood pressure reading: acute illness or injury Generalized weakness: acute illness or injury with systemic symptoms that poses a threat to life or bodily functions Hyponatremia: acute illness or injury with systemic symptoms that poses a threat to life or bodily functions  Amount and/or Complexity of Data Reviewed Independent Historian: EMS    Details: hx External Data Reviewed: labs and notes. Labs: ordered. Decision-making details documented in ED Course. Radiology: ordered and independent interpretation performed. Decision-making details documented in ED Course. ECG/medicine tests: ordered and independent interpretation performed. Decision-making details documented in ED Course.  Risk Decision regarding hospitalization.   Iv ns. Continuous pulse ox and cardiac monitoring. Labs ordered/sent. Imaging ordered.   Differential diagnosis includes hyponatremia, dehydration, uti, etc. Dispo decision including potential need for admission considered - will get labs and imaging and reassess.   Reviewed nursing notes and prior charts for additional history. External reports reviewed. Additional history from: EMS.   Cardiac monitor: sinus rhythm, rate 80.  NS bolus.  Labs reviewed/interpreted by me - na v low,  119. Wbc and hgb normal.   Xrays reviewed/interpreted by me - no pna.  Additional ns bolus.   Hospitalists consulted for admission.  Neurochecks - stable from initial, no acute change.   CRITICAL CARE RE: severe hyponatremia Performed by: Suzi Roots Total critical care time: 45 minutes Critical care time was exclusive of separately billable procedures and treating other patients. Critical care was necessary to treat or prevent imminent or life-threatening deterioration. Critical care was time spent personally by me on the following activities: development of treatment plan with patient and/or surrogate as well as nursing, discussions with consultants, evaluation of patient's response to treatment, examination of patient, obtaining history from patient or surrogate, ordering and performing treatments and interventions, ordering and review of laboratory studies, ordering and review of radiographic studies, pulse oximetry and re-evaluation of patient's condition.             Final Clinical Impression(s) / ED Diagnoses Final diagnoses:  None    Rx / DC Orders ED Discharge Orders     None         Cathren Laine, MD 11/16/22 1952

## 2022-11-16 NOTE — H&P (Incomplete)
History and Physical  Erica Ball JXB:[id number] DOB: 01-22-1951 DOA: 11/16/2022  Referring physician: Dr. Denton Lank, EDP  PCP: Norm Salt, [place]  Outpatient Specialists: None Patient coming from: SNF  Chief Complaint: Confusion   HPI: Erica Ball is a 72 y.o. female with medical history significant for alcohol abuse, tobacco abuse, hypertension, COPD, recently admitted from 10/23/2022 until 11/06/2022 for SVT and elevated troponin, who presents from SNF due to generalized weakness and mild confusion compared to baseline, symptoms were worse today.  The patient had some blood work done at the skilled nursing facility which revealed sodium level of 120.  EMS was activated and the patient was brought into the ED for further evaluation.  In the ED, repeated serum sodium 119.  The patient is awake and alert.  Denies nausea, headache, or lightheadedness.  Mild confusion noted.  Her UA returned positive for pyuria.  She received IV fluid normal saline bolus 1L x1 and Rocephin in the ED.  TRH, hospitalist service, was asked to admit.   ED Course: Temperature 97.9.  BP 177/90, pulse 70, respiratory 15, O2 saturation 97% on room air.  Review of Systems: Review of systems as noted in the HPI. All other systems reviewed and are negative.   Past Medical History:  Diagnosis Date   Acute kidney injury (HCC) 07/10/2017   Acute respiratory failure with hypoxia (HCC) 07/10/2017   Alcohol abuse    Allergy    Cataract    Diverticulitis 10/30/2011   Elevated troponin 07/10/2017   Hypertension    Lobar pneumonia (HCC) 07/10/2017   Pleural effusion on right 07/10/2017   Sepsis (HCC) 07/10/2017   SVT (supraventricular tachycardia)    Tobacco abuse    UTI (urinary tract infection) 07/10/2017   Vitamin D deficiency 10/30/2011   Past Surgical History:  Procedure Laterality Date   ABDOMINAL HYSTERECTOMY     CATARACT EXTRACTION     FRACTURE SURGERY     IR THORACENTESIS ASP PLEURAL SPACE  W/IMG GUIDE  07/11/2017   VIDEO ASSISTED THORACOSCOPY (VATS)/EMPYEMA Right 07/12/2017   Procedure: right VIDEO ASSISTED THORACOSCOPY (VATS) for drainage of EMPYEMA and decortication;  Surgeon: Loreli Slot, MD;  Location: [facility] OR;  Service: Thoracic;  Laterality: Right;   VIDEO BRONCHOSCOPY N/A 07/12/2017   Procedure: VIDEO BRONCHOSCOPY;  Surgeon: Loreli Slot, MD;  Location: [facility] OR;  Service: Thoracic;  Laterality: N/A;    Social History:  reports that she has been smoking cigarettes. She has a 79.5 pack-year smoking history. She has never used smokeless tobacco. She reports current alcohol use of about 21.0 standard drinks of alcohol per week. She reports that she does not use drugs.   Allergies  Allergen Reactions   Dextromethorphan Other (See Comments)    Makes her very angry and she wants to hurt people    Family History  Problem Relation Age of Onset   Hypertension Mother    Heart disease Mother    Hypertension Father    Heart disease Father    Stroke Brother    Hypertension Brother    Heart disease Brother       Prior to Admission medications   Medication Sig Start Date End Date Taking? Authorizing Provider  albuterol (PROVENTIL HFA;VENTOLIN HFA) 108 (90 Base) MCG/ACT inhaler Inhale 1-2 puffs into the lungs every 4 (four) hours as needed for shortness of breath. 07/10/17   [provider]  aspirin EC 81 MG tablet Take 1 tablet (81 mg total) by mouth daily. Patient  not taking: Reported on 10/23/2022 12/06/17   Wendall Stade, MD  EPINEPHrine (PRIMATENE MIST IN) Inhale 1 puff into the lungs daily as needed (congestion).    [provider]  folic acid (FOLVITE) 1 MG tablet Take 1 tablet (1 mg total) by mouth daily. 11/05/22   Cathleen Corti, MD  losartan (COZAAR) 100 MG tablet Take 1 tablet (100 mg total) by mouth daily. 11/06/22   Amin, Loura Halt, MD  metoprolol succinate (TOPROL XL) 100 MG 24 hr tablet Take 1 tablet (100 mg total) by mouth daily.  Take with or immediately following a meal. 11/04/22 11/04/23  Cathleen Corti, MD  oxycodone (OXY-IR) 5 MG capsule Take 1 capsule (5 mg total) by mouth every 6 (six) hours as needed for pain. 11/05/22   Amin, Loura Halt, MD  rosuvastatin (CRESTOR) 10 MG tablet Take 1 tablet (10 mg total) by mouth daily. 11/05/22   Cathleen Corti, MD  thiamine (VITAMIN B-1) 100 MG tablet Take 1 tablet (100 mg total) by mouth daily. 11/05/22   Cathleen Corti, MD    Physical Exam: BP (!) 171/83   Pulse (!) 125   Temp 97.6 F (36.4 C)   Resp 14   SpO2 96%   General: 72 y.o. year-old female well developed well nourished in no acute distress.  Alert and confused.  Hard of hearing. Cardiovascular: Regular rate and rhythm with no rubs or gallops.  No thyromegaly or JVD noted.  No lower extremity edema. 2/4 pulses in all 4 extremities. Respiratory: Clear to auscultation with no wheezes or rales.  Poor inspiratory effort. Abdomen: Soft nontender nondistended with normal bowel sounds x4 quadrants. Muskuloskeletal: No cyanosis, clubbing or edema noted bilaterally Neuro: CN II-XII intact, strength, sensation, reflexes Skin: No ulcerative lesions noted or rashes Psychiatry: Mood is appropriate for condition and setting          Labs on Admission:  Basic Metabolic Panel: Recent Labs  Lab 11/16/22 1846  NA 119*  K 3.8  CL 84*  CO2 26  GLUCOSE 106*  BUN 7*  CREATININE 0.61  CALCIUM 8.7*   Liver Function Tests: Recent Labs  Lab 11/16/22 1846  AST 17  ALT 14  ALKPHOS 50  BILITOT 0.6  PROT 6.3*  ALBUMIN 3.5   No results for input(s): "LIPASE", "AMYLASE" in the last 168 hours. No results for input(s): "AMMONIA" in the last 168 hours. CBC: Recent Labs  Lab 11/16/22 1846  WBC 8.9  HGB 12.4  HCT 34.7*  MCV 93.3  PLT 375   Cardiac Enzymes: No results for input(s): "CKTOTAL", "CKMB", "CKMBINDEX", "TROPONINI" in the last 168 hours.  BNP (last 3 results) No results for input(s):  "BNP" in the last 8760 hours.  ProBNP (last 3 results) No results for input(s): "PROBNP" in the last 8760 hours.  CBG: No results for input(s): "GLUCAP" in the last 168 hours.  Radiological Exams on Admission: DG Chest Port 1 View  Result Date: 11/16/2022 CLINICAL DATA:  Weakness EXAM: PORTABLE CHEST 1 VIEW COMPARISON:  08/06/2017 FINDINGS: Chronic scarring at the right base. No acute airspace disease. Stable cardiomediastinal silhouette with aortic atherosclerosis.vague foci of opacity overlying right second anterior rib end as well as the left mid to lower thorax, question healing rib fractures. IMPRESSION: 1. No active disease. Chronic scarring at the right base. 2. Vague foci of bilateral opacity, question healing rib fractures. Consider correlation with chest CT to exclude lung parenchymal abnormality. Electronically Signed   By: Selena Batten  Jake Samples M.D.   On: 11/16/2022 17:37    EKG: I independently viewed the EKG done and my findings are as followed: Temperature 97.9.  BP 177/99, pulse 70, respiratory 15, O2 saturation 94% on room air.  Assessment/Plan Present on Admission:  Hyponatremia  Principal Problem:   Hyponatremia  Hypovolemic hyponatremia Serum sodium 119 Avoid quick correction of hyponatremia, no more than 8 mEq in 24 hours. IV fluid hydration, NS initially at 75 cc/h x 1 day Serum sodium 119 to 124 at 0409 a.m., reduce rate to 50 cc/h x 4 hours Closely monitor BMP every 4 hours  Presumptive UTI Started on Rocephin in the ED, continue Monitor urine culture, DC antibiotics if negative  History of alcohol abuse Denies recent use of alcohol Multivitamins, folic acid and thiamine supplements restarted.  Hypertension, BP is not at goal, elevated Resume home oral antibiotics Monitor vital signs  Hyperlipidemia Resume home Crestor  Generalized weakness PT OT assessment Fall precautions.   Time: 75 minutes.   DVT prophylaxis: Subcu Lovenox daily  Code Status:  Full code  Family Communication: None at bedside  Disposition Plan: Admitted to progressive care unit  Consults called: None  Admission status: Inpatient status.   Status is: Inpatient The patient requires at least 2 midnights for further evaluation and treatment of present condition.   Darlin Drop MD [facility] Pager [phone]  If 7PM-7AM, please contact night-coverage [email or web address] Password [facility]  11/16/2022, 8:11 PM

## 2022-11-17 DIAGNOSIS — E871 Hypo-osmolality and hyponatremia: Secondary | ICD-10-CM | POA: Diagnosis not present

## 2022-11-17 LAB — BASIC METABOLIC PANEL
Anion gap: 9 (ref 5–15)
Anion gap: 11 (ref 5–15)
Anion gap: 15 (ref 5–15)
BUN: <5 mg/dL — ABNORMAL LOW (ref 8–23)
BUN: <5 mg/dL — ABNORMAL LOW (ref 8–23)
BUN: <5 mg/dL — ABNORMAL LOW (ref 8–23)
CO2: 23 mmol/L (ref 22–32)
CO2: 26 mmol/L (ref 22–32)
CO2: 25 mmol/L (ref 22–32)
Calcium: 8.5 mg/dL — ABNORMAL LOW (ref 8.9–10.3)
Calcium: 8.6 mg/dL — ABNORMAL LOW (ref 8.9–10.3)
Calcium: 8.8 mg/dL — ABNORMAL LOW (ref 8.9–10.3)
Chloride: 92 mmol/L — ABNORMAL LOW (ref 98–111)
Chloride: 88 mmol/L — ABNORMAL LOW (ref 98–111)
Chloride: 85 mmol/L — ABNORMAL LOW (ref 98–111)
Creatinine, Ser: 0.5 mg/dL (ref 0.44–1.00)
Creatinine, Ser: 0.49 mg/dL (ref 0.44–1.00)
Creatinine, Ser: 0.53 mg/dL (ref 0.44–1.00)
GFR, Estimated: >60 mL/min (ref >60)
GFR, Estimated: >60 mL/min (ref >60)
GFR, Estimated: >60 mL/min (ref >60)
Glucose, Bld: 88 mg/dL (ref 70–99)
Glucose, Bld: 105 mg/dL — ABNORMAL HIGH (ref 70–99)
Glucose, Bld: 139 mg/dL — ABNORMAL HIGH (ref 70–99)
Potassium: 3.3 mmol/L — ABNORMAL LOW (ref 3.5–5.1)
Potassium: 3.5 mmol/L (ref 3.5–5.1)
Potassium: 4.1 mmol/L (ref 3.5–5.1)
Sodium: 124 mmol/L — ABNORMAL LOW (ref 135–145)
Sodium: 125 mmol/L — ABNORMAL LOW (ref 135–145)
Sodium: 125 mmol/L — ABNORMAL LOW (ref 135–145)

## 2022-11-17 LAB — CBC
HCT: 32.2 % — ABNORMAL LOW (ref 36.0–46.0)
Hemoglobin: 11.4 g/dL — ABNORMAL LOW (ref 12.0–15.0)
MCH: 32.9 pg (ref 26.0–34.0)
MCHC: 35.4 g/dL (ref 30.0–36.0)
MCV: 93.1 fL (ref 80.0–100.0)
Platelets: 320 10*3/uL (ref 150–400)
RBC: 3.46 MIL/uL — ABNORMAL LOW (ref 3.87–5.11)
RDW: 11.5 % (ref 11.5–15.5)
WBC: 7.7 10*3/uL (ref 4.0–10.5)
nRBC: 0 % (ref 0.0–0.2)

## 2022-11-17 LAB — PHOSPHORUS: Phosphorus: 3.4 mg/dL (ref 2.5–4.6)

## 2022-11-17 LAB — MAGNESIUM: Magnesium: 1.5 mg/dL — ABNORMAL LOW (ref 1.7–2.4)

## 2022-11-17 MED ORDER — POTASSIUM CHLORIDE CRYS ER 20 MEQ PO TBCR
40.0000 meq | EXTENDED_RELEASE_TABLET | Freq: Once | ORAL | Status: AC
  Administered 2022-11-17: 40 meq via ORAL
  Filled 2022-11-17: qty 2

## 2022-11-17 MED ORDER — MAGNESIUM SULFATE 2 GM/50ML IV SOLN
2.0000 g | Freq: Once | INTRAVENOUS | Status: AC
  Administered 2022-11-17: 2 g via INTRAVENOUS
  Filled 2022-11-17: qty 50

## 2022-11-17 MED ORDER — LABETALOL HCL 5 MG/ML IV SOLN
10.0000 mg | INTRAVENOUS | Status: DC | PRN
  Administered 2022-11-17 – 2022-11-19 (×7): 10 mg via INTRAVENOUS
  Filled 2022-11-17 (×6): qty 4

## 2022-11-17 MED ORDER — ENSURE ENLIVE PO LIQD
237.0000 mL | Freq: Two times a day (BID) | ORAL | Status: DC
  Administered 2022-11-18 – 2022-11-19 (×4): 237 mL via ORAL

## 2022-11-17 MED ORDER — HYDRALAZINE HCL 25 MG PO TABS: 25.0000 mg | ORAL_TABLET | Freq: Three times a day (TID) | ORAL | Status: DC

## 2022-11-17 MED ORDER — HYDRALAZINE HCL 25 MG PO TABS
25.0000 mg | ORAL_TABLET | Freq: Three times a day (TID) | ORAL | Status: DC
  Administered 2022-11-17 – 2022-11-18 (×3): 25 mg via ORAL
  Filled 2022-11-17 (×3): qty 1

## 2022-11-17 MED ORDER — METOPROLOL SUCCINATE ER 100 MG PO TB24
100.0000 mg | ORAL_TABLET | Freq: Every day | ORAL | Status: DC
  Administered 2022-11-18 – 2022-11-29 (×12): 100 mg via ORAL
  Filled 2022-11-17 (×12): qty 1

## 2022-11-17 MED ORDER — SODIUM CHLORIDE 0.9 % IV SOLN: INTRAVENOUS | Status: DC

## 2022-11-17 MED ORDER — METOPROLOL SUCCINATE ER 50 MG PO TB24
87.5000 mg | ORAL_TABLET | Freq: Once | ORAL | Status: AC
  Administered 2022-11-17: 87.5 mg via ORAL
  Filled 2022-11-17: qty 2

## 2022-11-17 MED ORDER — HYDRALAZINE HCL 10 MG PO TABS
10.0000 mg | ORAL_TABLET | Freq: Three times a day (TID) | ORAL | Status: DC
  Administered 2022-11-17 (×2): 10 mg via ORAL
  Filled 2022-11-17 (×2): qty 1

## 2022-11-17 NOTE — Progress Notes (Signed)
PROGRESS NOTE  Erica Ball  ZOX:[id number] DOB: 03/23/1951 DOA: 11/16/2022 PCP: Norm Salt, [place]   Brief Narrative: Patient is a 72 year old female with history of alcohol abuse, tobacco abuse, hypertension, COPD who presented from skilled nursing facility with complaint of generalized weakness, confusion compared to baseline.  Lab work at [facility] showed a sodium of 120 and was sent to the emergency department.  Sodium of 119 on presentation.  UA was suspicious for UTI.  Patient was started on IV fluids, ceftriaxone.  Assessment & Plan:  Principal Problem:   Hyponatremia  Hyponatremia: Suspected to be hypovolemic hyponatremia.  Urine sodium of just 17.  Continue IV fluids.  Renal function stable.  Continue to monitor urine sodium.  Avoid overcorrection.  Allow improvement by 6 - 8 mEq per 24 hours.  But this is acute hyponatremia so slight overcorrection may be considerable.  Her last sodium level was 130 on 6/29.  Likely from poor oral intake, dehydration  Hypomagnesemia/hypokalemia: Currently being monitored and supplemented.  Suspected UTI: UA suspicious for UTI. Complains of some dysuria, Follow-up urine culture.  Continue ceftriaxone for now  History of alcohol use/tobacco use: Counseled cessation.  Continue thiamine, folic acid  Hypertension:On metoprolol  Hypokalemia/Hypomagnesia: Supplemented and being monitored  Hyperlipidemia: On Crestor  Recent history of left knee fracture: Follows with orthopedics.Has fractured patella.She was recommended NWB on left LE, and follow up with Orthopedics Dr Odis Hollingshead  Generalized weakness/deconditioning: Patient is from SNF.  PT/OT consulted here.  Increased weakness from baseline secondary to UTI, hyponatremia        DVT prophylaxis:enoxaparin (LOVENOX) injection 40 mg Start: 11/16/22 2015     Code Status: Full Code  Family Communication: called and discussed with son on phone on 7/13  Patient status:Inpatient  Patient  is from :SNF  Anticipated discharge to:SNF  Estimated DC date:1-2 days   Consultants: None  Procedures:None  Antimicrobials:  Anti-infectives (From admission, onward)    Start     Dose/Rate Route Frequency Ordered Stop   11/17/22 1000  cefTRIAXone (ROCEPHIN) 2 g in sodium chloride 0.9 % 100 mL IVPB        2 g 200 mL/hr over 30 Minutes Intravenous Every 24 hours 11/16/22 2014     11/16/22 2015  cefTRIAXone (ROCEPHIN) 1 g in sodium chloride 0.9 % 100 mL IVPB        1 g 200 mL/hr over 30 Minutes Intravenous  Once 11/16/22 2001 11/16/22 2043       Subjective: Patient seen and examined at the bedside this morning.  She is currently alert and oriented.  Complains of some weakness.  Not in any apparent distress.  She states she drinks hard liquor every day before she was in the rehab.  Smokes 1/2 packs a day.  Objective: Vitals:   11/16/22 1930 11/16/22 2100 11/17/22 0100 11/17/22 0445  BP: (!) 171/83 (!) 174/92 (!) 149/78 (!) 168/99  Pulse: (!) 125 65 60 65  Resp: 14 16 16 15   Temp:  98 F (36.7 C) 98 F (36.7 C) 97.9 F (36.6 C)  TempSrc:   Oral Oral  SpO2: 96% 99% 99% 97%  Weight:   55.7 kg   Height:   5\' 8"  (1.727 m)     Intake/Output Summary (Last 24 hours) at 11/17/2022 0745 Last data filed at 11/17/2022 0400 Gross per 24 hour  Intake 1254.58 ml  Output --  Net 1254.58 ml   Filed Weights   11/17/22 0100  Weight: 55.7 kg  Examination:  General exam: Overall comfortable, not in distress,weak appearing HEENT: PERRL Respiratory system:  no wheezes or crackles  Cardiovascular system: S1 & S2 heard, RRR.  Gastrointestinal system: Abdomen is nondistended, soft and nontender. Central nervous system: Alert and oriented Extremities: No edema, no clubbing ,no cyanosis, brace on left knee Skin: No rashes, no ulcers,no icterus     Data Reviewed: I have personally reviewed following labs and imaging studies  CBC: Recent Labs  Lab 11/16/22 1846 11/17/22 0409   WBC 8.9 7.7  HGB 12.4 11.4*  HCT 34.7* 32.2*  MCV 93.3 93.1  PLT 375 320   Basic Metabolic Panel: Recent Labs  Lab 11/16/22 1846 11/17/22 0409  NA 119* 124*  K 3.8 3.3*  CL 84* 92*  CO2 26 23  GLUCOSE 106* 88  BUN 7* <5*  CREATININE 0.61 0.50  CALCIUM 8.7* 8.5*  MG  --  1.5*  PHOS  --  3.4     No results found for this or any previous visit (from the past 240 hour(s)).   Radiology Studies: DG Chest Port 1 View  Result Date: 11/16/2022 CLINICAL DATA:  Weakness EXAM: PORTABLE CHEST 1 VIEW COMPARISON:  08/06/2017 FINDINGS: Chronic scarring at the right base. No acute airspace disease. Stable cardiomediastinal silhouette with aortic atherosclerosis.vague foci of opacity overlying right second anterior rib end as well as the left mid to lower thorax, question healing rib fractures. IMPRESSION: 1. No active disease. Chronic scarring at the right base. 2. Vague foci of bilateral opacity, question healing rib fractures. Consider correlation with chest CT to exclude lung parenchymal abnormality. Electronically Signed   By: Jasmine Pang M.D.   On: 11/16/2022 17:37    Scheduled Meds:  enoxaparin (LOVENOX) injection  40 mg Subcutaneous Q24H   folic acid  1 mg Oral Daily   hydrALAZINE  10 mg Oral TID   metoprolol succinate  12.5 mg Oral Daily   potassium chloride  40 mEq Oral Once   rosuvastatin  10 mg Oral Daily   thiamine  100 mg Oral Daily   Continuous Infusions:  sodium chloride     cefTRIAXone (ROCEPHIN)  IV     magnesium sulfate bolus IVPB       LOS: 1 day   Burnadette Pop, MD [facility] P7/13/2024, 7:45 AM

## 2022-11-17 NOTE — Plan of Care (Signed)
  Problem: Education: Goal: Knowledge of disease or condition will improve Outcome: Progressing   

## 2022-11-17 NOTE — ED Notes (Signed)
ED TO INPATIENT HANDOFF REPORT  ED Nurse Name and Phone #: Theadora Rama RN [phone]  S Name/Age/Gender Erica Ball 72 y.o. female Room/Bed: 028C/028C  Code Status   Code Status: Full Code  Home/SNF/Other Nursing Home Patient oriented to: self, place, time, and situation Is this baseline? Yes   Triage Complete: Triage complete  Chief Complaint Hyponatremia [E87.1]  Triage Note Pt BIB [facility] from [facility] SNF d/t her blood work there resulting with her Na at 120. Pt states she does not feel like herself & EMS unable to report her orientation baseline, but does report there is confusion on board. She does have a baseline speech deficit. 138/92, 60 bpm NSR, CBG 120, Full Code.     Allergies Allergies  Allergen Reactions   Dextromethorphan Other (See Comments)    Makes her very angry and she wants to hurt people    Level of Care/Admitting Diagnosis ED Disposition     ED Disposition  Admit   Condition  --   Comment  Hospital Area: [facility] [[id number]]  Level of Care: Progressive [102]  Admit to Progressive based on following criteria: NEUROLOGICAL AND NEUROSURGICAL complex patients with significant risk of instability, who do not meet ICU criteria, yet require close observation or frequent assessment (< / = every 2 - 4 hours) with medical / nursing intervention.  May admit patient to [facility] or [facility] if equivalent level of care is available:: Yes  Covid Evaluation: Asymptomatic - no recent exposure (last 10 days) testing not required  Diagnosis: Hyponatremia [198519]  Admitting Physician: Darlin Drop [[id number]]  Attending Physician: Darlin Drop [[id number]]  Certification:: I certify this patient will need inpatient services for at least 2 midnights  Estimated Length of Stay: 2          B Medical/Surgery History Past Medical History:  Diagnosis Date   Acute kidney injury (HCC) 07/10/2017   Acute respiratory failure with hypoxia (HCC)  07/10/2017   Alcohol abuse    Allergy    Cataract    Diverticulitis 10/30/2011   Elevated troponin 07/10/2017   Hypertension    Lobar pneumonia (HCC) 07/10/2017   Pleural effusion on right 07/10/2017   Sepsis (HCC) 07/10/2017   SVT (supraventricular tachycardia)    Tobacco abuse    UTI (urinary tract infection) 07/10/2017   Vitamin D deficiency 10/30/2011   Past Surgical History:  Procedure Laterality Date   ABDOMINAL HYSTERECTOMY     CATARACT EXTRACTION     FRACTURE SURGERY     IR THORACENTESIS ASP PLEURAL SPACE W/IMG GUIDE  07/11/2017   VIDEO ASSISTED THORACOSCOPY (VATS)/EMPYEMA Right 07/12/2017   Procedure: right VIDEO ASSISTED THORACOSCOPY (VATS) for drainage of EMPYEMA and decortication;  Surgeon: Loreli Slot, MD;  Location: [facility] OR;  Service: Thoracic;  Laterality: Right;   VIDEO BRONCHOSCOPY N/A 07/12/2017   Procedure: VIDEO BRONCHOSCOPY;  Surgeon: Loreli Slot, MD;  Location: [facility] OR;  Service: Thoracic;  Laterality: N/A;     A IV Location/Drains/Wounds Patient Lines/Drains/Airways Status     Active Line/Drains/Airways     Name Placement date Placement time Site Days   Peripheral IV 11/16/22 20 G Anterior;Proximal;Right Forearm 11/16/22  1831  Forearm  1            Intake/Output Last 24 hours  Intake/Output Summary (Last 24 hours) at 11/17/2022 0026 Last data filed at 11/16/2022 2043 Gross per 24 hour  Intake 1094.04 ml  Output --  Net 1094.04 ml  Labs/Imaging Results for orders placed or performed during the hospital encounter of 11/16/22 (from the past 48 hour(s))  CBC     Status: Abnormal   Collection Time: 11/16/22  6:46 PM  Result Value Ref Range   WBC 8.9 4.0 - 10.5 K/uL    Comment: WHITE COUNT CONFIRMED ON SMEAR   RBC 3.72 (L) 3.87 - 5.11 MIL/uL   Hemoglobin 12.4 12.0 - 15.0 g/dL   HCT 40.9 (L) 81.1 - 91.4 %   MCV 93.3 80.0 - 100.0 fL   MCH 33.3 26.0 - 34.0 pg   MCHC 35.7 30.0 - 36.0 g/dL   RDW 78.2 95.6 - 21.3 %   Platelets  375 150 - 400 K/uL    Comment: REPEATED TO VERIFY   nRBC 0.0 0.0 - 0.2 %    Comment: Performed at [facility], [address] [place], [place] [address]  Comprehensive metabolic panel     Status: Abnormal   Collection Time: 11/16/22  6:46 PM  Result Value Ref Range   Sodium 119 (LL) 135 - 145 mmol/L    Comment: CRITICAL RESULT CALLED TO, READ BACK BY AND VERIFIED WITH P.Keyia Moretto,RN @1942  11/16/2022 VANG.J   Potassium 3.8 3.5 - 5.1 mmol/L   Chloride 84 (L) 98 - 111 mmol/L   CO2 26 22 - 32 mmol/L   Glucose, Bld 106 (H) 70 - 99 mg/dL    Comment: Glucose reference range applies only to samples taken after fasting for at least 8 hours.   BUN 7 (L) 8 - 23 mg/dL   Creatinine, Ser 8.46 0.44 - 1.00 mg/dL   Calcium 8.7 (L) 8.9 - 10.3 mg/dL   Total Protein 6.3 (L) 6.5 - 8.1 g/dL   Albumin 3.5 3.5 - 5.0 g/dL   AST 17 15 - 41 U/L   ALT 14 0 - 44 U/L   Alkaline Phosphatase 50 38 - 126 U/L   Total Bilirubin 0.6 0.3 - 1.2 mg/dL   GFR, Estimated >96 >29 mL/min    Comment: (NOTE) Calculated using the CKD-EPI Creatinine Equation (2021)    Anion gap 9 5 - 15    Comment: Performed at [facility], [address], [place], [place] [address]  Osmolality     Status: Abnormal   Collection Time: 11/16/22  6:46 PM  Result Value Ref Range   Osmolality 248 (LL) 275 - 295 mOsm/kg    Comment: REPEATED TO VERIFY CRITICAL RESULT CALLED TO, READ BACK BY AND VERIFIED WITH: Called to Rica Koyanagi, RN@1925  11/16/2022 by S.Stanley Performed at [facility], [address] [place], [place] [address]   Urinalysis, Routine w reflex microscopic -Urine, Clean Catch     Status: Abnormal   Collection Time: 11/16/22  7:37 PM  Result Value Ref Range   Color, Urine YELLOW YELLOW   APPearance HAZY (A) CLEAR   Specific Gravity, Urine 1.003 (L) 1.005 - 1.030   pH 6.0 5.0 - 8.0   Glucose, UA NEGATIVE NEGATIVE mg/dL   Hgb urine dipstick SMALL (A) NEGATIVE   Bilirubin Urine NEGATIVE NEGATIVE   Ketones, ur  NEGATIVE NEGATIVE mg/dL   Protein, ur NEGATIVE NEGATIVE mg/dL   Nitrite POSITIVE (A) NEGATIVE   Leukocytes,Ua LARGE (A) NEGATIVE   RBC / HPF 6-10 0 - 5 RBC/hpf   WBC, UA 21-50 0 - 5 WBC/hpf   Bacteria, UA FEW (A) NONE SEEN   Squamous Epithelial / HPF 0-5 0 - 5 /HPF   Mucus PRESENT  Comment: Performed at [facility], [address], [place], [place] [address]  Osmolality, urine     Status: Abnormal   Collection Time: 11/16/22  7:37 PM  Result Value Ref Range   Osmolality, Ur 124 (L) 300 - 900 mOsm/kg    Comment: Performed at [facility], [address], [place], [place] [address]  Sodium, urine, random     Status: None   Collection Time: 11/16/22  7:37 PM  Result Value Ref Range   Sodium, Ur 17 mmol/L    Comment: Performed at [facility], [address] [place], [place] [address]   DG Chest Port 1 View  Result Date: 11/16/2022 CLINICAL DATA:  Weakness EXAM: PORTABLE CHEST 1 VIEW COMPARISON:  08/06/2017 FINDINGS: Chronic scarring at the right base. No acute airspace disease. Stable cardiomediastinal silhouette with aortic atherosclerosis.vague foci of opacity overlying right second anterior rib end as well as the left mid to lower thorax, question healing rib fractures. IMPRESSION: 1. No active disease. Chronic scarring at the right base. 2. Vague foci of bilateral opacity, question healing rib fractures. Consider correlation with chest CT to exclude lung parenchymal abnormality. Electronically Signed   By: Jasmine Pang M.D.   On: 11/16/2022 17:37    Pending Labs Unresulted Labs (From admission, onward)     Start     Ordered   11/23/22 0500  Creatinine, serum  (enoxaparin (LOVENOX)    CrCl >/= 30 ml/min)  Weekly,   R     Comments: while on enoxaparin therapy    11/16/22 2011   11/17/22 0500  CBC  Tomorrow morning,   R        11/16/22 2011   11/17/22 0500  Magnesium  Tomorrow morning,   R        11/16/22 2011   11/17/22 0500  Phosphorus  Tomorrow morning,    R        11/16/22 2011   11/16/22 2011  Basic metabolic panel  Now then every 4 hours,   R (with TIMED occurrences)      11/16/22 2010   11/16/22 2004  Urine Culture  Add-on,   AD       Question:  Indication  Answer:  Altered mental status (if no other cause identified)   11/16/22 2003            Vitals/Pain Today's Vitals   11/16/22 1815 11/16/22 1840 11/16/22 1930 11/16/22 2100  BP:  (!) 178/98 (!) 171/83 (!) 174/92  Pulse:  64 (!) 125 65  Resp:  14 14 16   Temp:    98 F (36.7 C)  SpO2:  100% 96% 99%  PainSc: 0-No pain       Isolation Precautions No active isolations  Medications Medications  enoxaparin (LOVENOX) injection 40 mg (40 mg Subcutaneous Given 11/16/22 2134)  albuterol (VENTOLIN HFA) 108 (90 Base) MCG/ACT inhaler 1-2 puff (has no administration in time range)  folic acid (FOLVITE) tablet 1 mg (1 mg Oral Given 11/16/22 2133)  rosuvastatin (CRESTOR) tablet 10 mg (10 mg Oral Given 11/16/22 2133)  thiamine (VITAMIN B1) tablet 100 mg (100 mg Oral Given 11/16/22 2134)  metoprolol succinate (TOPROL-XL) 24 hr tablet 12.5 mg (12.5 mg Oral Given 11/16/22 2133)  acetaminophen (TYLENOL) tablet 650 mg (has no administration in time range)  prochlorperazine (COMPAZINE) injection 5 mg (has no administration in time range)  polyethylene glycol (MIRALAX / GLYCOLAX) packet 17 g (has no administration in time range)  melatonin tablet 5 mg (has no administration in time range)  cefTRIAXone (ROCEPHIN) 2 g in sodium chloride 0.9 % 100 mL IVPB (has no administration in time range)  0.9 %  sodium chloride infusion ( Intravenous New Bag/Given 11/16/22 2130)  sodium chloride 0.9 % bolus 1,000 mL (0 mLs Intravenous Stopped 11/16/22 2003)  cefTRIAXone (ROCEPHIN) 1 g in sodium chloride 0.9 % 100 mL IVPB (0 g Intravenous Stopped 11/16/22 2043)    Mobility Patient not ambulated in ED. High fall risk     Focused Assessments Neuro Assessment Handoff:  Swallow screen pass? Yes           Neuro Assessment: Exceptions to WDL Neuro Checks:      Has TPA been given? No If patient is a Neuro Trauma and patient is going to OR before floor call report to 4N Charge nurse: [phone] or [phone]   R Recommendations: See Admitting Provider Note  Report given to:   Additional Notes:

## 2022-11-17 NOTE — Evaluation (Signed)
Physical Therapy Evaluation Patient Details Name: Erica Ball MRN: [id number] DOB: 03-Jul-1950 Today's Date: 11/17/2022  History of Present Illness  72 year old female admitted 7/12 from SNF with hyponatremia, Hypomagnesemia/hypokalemia, and suspected UTI. Recent admission 6/18 after fall, found to have left tibial plateau fracture. PMHx: alcohol abuse, tobacco abuse, HTN, COPD, mobility issues  Clinical Impression  Pt admitted with above diagnosis. From SNF where she was working with rehab team on transfers and w/c mobility. Pt with some memory deficits presently. VSS during session. Able to mobilize to EOB without physical assist, mod assistance to stand and min assist to pivot to recliner. Maintains NWB on LLE with knee immobilizer in place. Denies rt foot pain which was present on last admission. States pain is better. SpO2 100% on RA. Pt currently with functional limitations due to the deficits listed below (see PT Problem List). Pt will benefit from acute skilled PT to increase their independence and safety with mobility to allow discharge.           Assistance Recommended at Discharge Frequent or constant Supervision/Assistance  If plan is discharge home, recommend the following:  Can travel by private vehicle  Two people to help with walking and/or transfers;Two people to help with bathing/dressing/bathroom;Assist for transportation;Help with stairs or ramp for entrance   No (possibly soon)    Equipment Recommendations None recommended by PT  Recommendations for Other Services       Functional Status Assessment Patient has had a recent decline in their functional status and demonstrates the ability to make significant improvements in function in a reasonable and predictable amount of time.     Precautions / Restrictions Precautions Precautions: Fall Required Braces or Orthoses: Knee Immobilizer - Left Restrictions Weight Bearing Restrictions: Yes RLE Weight Bearing:  Weight bearing as tolerated LLE Weight Bearing: Non weight bearing      Mobility  Bed Mobility Overal bed mobility: Needs Assistance Bed Mobility: Supine to Sit Rolling: Min guard     Sit to supine: Min guard, HOB elevated   General bed mobility comments: Min guard for safety, slow to rise, able to perform without physical assist. Extra time.    Transfers Overall transfer level: Needs assistance Equipment used: Rolling walker (2 wheels) Transfers: Sit to/from Stand, Bed to chair/wheelchair/BSC Sit to Stand: Mod assist Stand pivot transfers: Min assist         General transfer comment: Mod assist for boost to stand, maintains NWB on Lt with KI in place. Cues for hand placement, slow to rise and effortful. Min assist for pivot to recliner on Rt foot, assisted with RW movement and balalnce, still safely maintained NWB on Lt with transition.    Ambulation/Gait               General Gait Details: unable  Stairs            Wheelchair Mobility     Tilt Bed    Modified Rankin (Stroke Patients Only)       Balance Overall balance assessment: Needs assistance, History of Falls Sitting-balance support: No upper extremity supported, Feet supported Sitting balance-Leahy Scale: Fair Sitting balance - Comments: sitting EOB   Standing balance support: During functional activity, Bilateral upper extremity supported, Reliant on assistive device for balance Standing balance-Leahy Scale: Poor Standing balance comment: with RW and assist for balance  Pertinent Vitals/Pain Pain Assessment Pain Assessment: Faces Faces Pain Scale: Hurts a little bit Pain Location: LLE with mobility Pain Descriptors / Indicators: Discomfort, Sore Pain Intervention(s): Limited activity within patient's tolerance, Monitored during session, Repositioned    Home Living Family/patient expects to be discharged to:: Skilled nursing facility Living  Arrangements: Alone Available Help at Discharge: Family;Available PRN/intermittently Type of Home: Apartment Home Access: Level entry       Home Layout: One level Home Equipment: Grab bars - tub/shower;BSC/3in1;Rolling Walker (2 wheels);Rollator (4 wheels);Cane - single point Additional Comments: Brother helps as needed. 3 falls in the last couple of weeks    Prior Function Prior Level of Function : Needs assist;History of Falls (last six months)             Mobility Comments: Before recent SNF admission she was using rollator in apartment. Multiple falls reported. At SNF pt has been working on transfers and mobilizing herself in W/c. ADLs Comments: At home Brother would assist getting up after fall, states she could perform bath/dress herself. Cooks own meals. Since SNF requires assist with all ADLs.     Hand Dominance   Dominant Hand: Right    Extremity/Trunk Assessment   Upper Extremity Assessment Upper Extremity Assessment: Defer to OT evaluation    Lower Extremity Assessment Lower Extremity Assessment: LLE deficits/detail LLE Deficits / Details: In knee immoblizer.       Communication   Communication: No difficulties  Cognition Arousal/Alertness: Awake/alert Behavior During Therapy: WFL for tasks assessed/performed Overall Cognitive Status: Impaired/Different from baseline Area of Impairment: Orientation, Memory, Following commands, Safety/judgement, Problem solving                 Orientation Level: Disoriented to, Time, Situation   Memory: Decreased short-term memory Following Commands: Follows one step commands with increased time Safety/Judgement: Decreased awareness of safety   Problem Solving: Slow processing, Difficulty sequencing, Requires verbal cues          General Comments General comments (skin integrity, edema, etc.): VSS on RA; 100% SpO2    Exercises General Exercises - Lower Extremity Ankle Circles/Pumps: AROM, Both, 10 reps,  Seated Quad Sets: Strengthening, Both, Seated, 10 reps Gluteal Sets: Strengthening, Both, Seated, 10 reps   Assessment/Plan    PT Assessment Patient needs continued PT services  PT Problem List Decreased strength;Decreased range of motion;Decreased activity tolerance;Decreased mobility;Decreased balance;Decreased knowledge of use of DME;Pain;Decreased knowledge of precautions;Decreased safety awareness;Decreased cognition       PT Treatment Interventions DME instruction;Gait training;Functional mobility training;Therapeutic activities;Balance training;Therapeutic exercise;Neuromuscular re-education;Cognitive remediation;Patient/family education;Wheelchair mobility training;Modalities    PT Goals (Current goals can be found in the Care Plan section)  Acute Rehab PT Goals Patient Stated Goal: Back to rehab, regain independence PT Goal Formulation: With patient Time For Goal Achievement: 12/01/22 Potential to Achieve Goals: Good    Frequency Min 1X/week     Co-evaluation               AM-PAC PT "6 Clicks" Mobility  Outcome Measure Help needed turning from your back to your side while in a flat bed without using bedrails?: A Little Help needed moving from lying on your back to sitting on the side of a flat bed without using bedrails?: A Little Help needed moving to and from a bed to a chair (including a wheelchair)?: A Lot Help needed standing up from a chair using your arms (e.g., wheelchair or bedside chair)?: A Lot Help needed to walk in hospital room?: Total Help needed  climbing 3-5 steps with a railing? : Total 6 Click Score: 12    End of Session Equipment Utilized During Treatment: Gait belt;Left knee immobilizer Activity Tolerance: Patient tolerated treatment well Patient left: with call bell/phone within reach;in chair;with chair alarm set;with family/visitor present Nurse Communication: Mobility status PT Visit Diagnosis: Unsteadiness on feet (R26.81);Repeated  falls (R29.6);Muscle weakness (generalized) (M62.81);History of falling (Z91.81);Difficulty in walking, not elsewhere classified (R26.2);Pain Pain - Right/Left: Left Pain - part of body: Knee    Time: 1225-1301 PT Time Calculation (min) (ACUTE ONLY): 36 min   Charges:   PT Evaluation $PT Eval Low Complexity: 1 Low PT Treatments $Therapeutic Activity: 8-22 mins PT General Charges $$ ACUTE PT VISIT: 1 Visit         Kathlyn [place], PT, DPT [facility]  Rehabilitation Services Physical Therapist Office: [phone] Website: [email or web address]   Berton Mount 11/17/2022, 2:12 PM

## 2022-11-18 DIAGNOSIS — E871 Hypo-osmolality and hyponatremia: Secondary | ICD-10-CM | POA: Diagnosis not present

## 2022-11-18 LAB — BASIC METABOLIC PANEL
Anion gap: 8 (ref 5–15)
BUN: <5 mg/dL — ABNORMAL LOW (ref 8–23)
CO2: 25 mmol/L (ref 22–32)
Calcium: 8.4 mg/dL — ABNORMAL LOW (ref 8.9–10.3)
Chloride: 93 mmol/L — ABNORMAL LOW (ref 98–111)
Creatinine, Ser: 0.5 mg/dL (ref 0.44–1.00)
GFR, Estimated: >60 mL/min (ref >60)
Glucose, Bld: 104 mg/dL — ABNORMAL HIGH (ref 70–99)
Potassium: 3.7 mmol/L (ref 3.5–5.1)
Sodium: 126 mmol/L — ABNORMAL LOW (ref 135–145)

## 2022-11-18 LAB — URINE CULTURE

## 2022-11-18 LAB — MAGNESIUM: Magnesium: 1.6 mg/dL — ABNORMAL LOW (ref 1.7–2.4)

## 2022-11-18 MED ORDER — SODIUM CHLORIDE 1 G PO TABS
1.0000 g | ORAL_TABLET | Freq: Two times a day (BID) | ORAL | Status: DC
  Administered 2022-11-18 – 2022-11-19 (×2): 1 g via ORAL
  Filled 2022-11-18 (×2): qty 1

## 2022-11-18 MED ORDER — HYDRALAZINE HCL 50 MG PO TABS
50.0000 mg | ORAL_TABLET | Freq: Three times a day (TID) | ORAL | Status: DC
  Administered 2022-11-18 – 2022-11-19 (×3): 50 mg via ORAL
  Filled 2022-11-18 (×3): qty 1

## 2022-11-18 MED ORDER — SODIUM CHLORIDE 1 G PO TABS
2.0000 g | ORAL_TABLET | Freq: Two times a day (BID) | ORAL | Status: DC
  Administered 2022-11-18: 2 g via ORAL
  Filled 2022-11-18: qty 2

## 2022-11-18 MED ORDER — PHENAZOPYRIDINE HCL 100 MG PO TABS
100.0000 mg | ORAL_TABLET | Freq: Three times a day (TID) | ORAL | Status: AC
  Administered 2022-11-18 – 2022-11-21 (×11): 100 mg via ORAL
  Filled 2022-11-18 (×12): qty 1

## 2022-11-18 NOTE — Progress Notes (Signed)
[facility] night cross cover note:   I was notified by RN of request for Phenazopyridine for dysuria in the context of recent diagnosis of urinary tract infection for which the patient is undergoing antibiotic therapy via Rocephin.  I subsequently placed order for Phenazopyridine.      Newton Pigg, DO Hospitalist

## 2022-11-18 NOTE — Evaluation (Signed)
Occupational Therapy Evaluation Patient Details Name: Erica Ball MRN: [id number] DOB: 1951-01-27 Today's Date: 11/18/2022   History of Present Illness 72 year old female admitted 7/12 from SNF with hyponatremia, Hypomagnesemia/hypokalemia, and suspected UTI. Recent admission 6/18 after fall, found to have left tibial plateau fracture. PMHx: alcohol abuse, tobacco abuse, HTN, COPD, mobility issues   Clinical Impression   Pt reports needing assist at baseline with ADLs and was using a w/c at rehab for mobility. Pt currently needing set up -mod A for ADLs, min guard for bed mobility, and min A for transfers with RW. Min cues to adhere to NWB precautions for LLE. Pt presenting with impairments listed below, will follow acutely. Patient will benefit from continued inpatient follow up therapy, <3 hours/day to maximize safety/ind with ADLs/functional mobility.       Recommendations for follow up therapy are one component of a multi-disciplinary discharge planning process, led by the attending physician.  Recommendations may be updated based on patient status, additional functional criteria and insurance authorization.   Assistance Recommended at Discharge Frequent or constant Supervision/Assistance  Patient can return home with the following A lot of help with walking and/or transfers;A lot of help with bathing/dressing/bathroom;Assistance with cooking/housework;Direct supervision/assist for medications management;Direct supervision/assist for financial management;Assist for transportation;Help with stairs or ramp for entrance    Functional Status Assessment  Patient has had a recent decline in their functional status and demonstrates the ability to make significant improvements in function in a reasonable and predictable amount of time.  Equipment Recommendations  Other (comment) (defer)    Recommendations for Other Services PT consult     Precautions / Restrictions  Precautions Precautions: Fall Required Braces or Orthoses: Knee Immobilizer - Left Knee Immobilizer - Left: On at all times Restrictions Weight Bearing Restrictions: Yes RLE Weight Bearing: Weight bearing as tolerated LLE Weight Bearing: Non weight bearing      Mobility Bed Mobility Overal bed mobility: Needs Assistance Bed Mobility: Supine to Sit Rolling: Min guard              Transfers Overall transfer level: Needs assistance Equipment used: Rolling walker (2 wheels) Transfers: Sit to/from Stand, Bed to chair/wheelchair/BSC Sit to Stand: Min assist Stand pivot transfers: Min assist         General transfer comment: x2 transfers to [facility] and to recliner, min cues to follow NWB precautions      Balance Overall balance assessment: Needs assistance, History of Falls Sitting-balance support: No upper extremity supported, Feet supported Sitting balance-Leahy Scale: Fair Sitting balance - Comments: sitting EOB   Standing balance support: During functional activity, Bilateral upper extremity supported, Reliant on assistive device for balance Standing balance-Leahy Scale: Poor Standing balance comment: with RW and assist for balance                           ADL either performed or assessed with clinical judgement   ADL Overall ADL's : Needs assistance/impaired Eating/Feeding: Set up;Sitting   Grooming: Set up;Sitting   Upper Body Bathing: Minimal assistance;Sitting   Lower Body Bathing: Moderate assistance;Sitting/lateral leans   Upper Body Dressing : Minimal assistance;Sitting   Lower Body Dressing: Moderate assistance;Sitting/lateral leans   Toilet Transfer: Minimal assistance;Stand-pivot;BSC/3in1;Rolling walker (2 wheels)   Toileting- Clothing Manipulation and Hygiene: Supervision/safety;Sitting/lateral lean;Sit to/from stand       Functional mobility during ADLs: Rolling walker (2 wheels);Minimal assistance       Vision   Vision  Assessment?: No apparent visual  deficits     Perception Perception Perception Tested?: No   Praxis Praxis Praxis tested?: Not tested    Pertinent Vitals/Pain Pain Assessment Pain Assessment: No/denies pain     Hand Dominance Right   Extremity/Trunk Assessment Upper Extremity Assessment Upper Extremity Assessment: Generalized weakness   Lower Extremity Assessment Lower Extremity Assessment: Defer to PT evaluation LLE Deficits / Details: In L knee immoblizer.       Communication Communication Communication: No difficulties   Cognition Arousal/Alertness: Awake/alert Behavior During Therapy: WFL for tasks assessed/performed Overall Cognitive Status: Impaired/Different from baseline Area of Impairment: Orientation, Memory, Following commands, Safety/judgement, Problem solving                 Orientation Level: Disoriented to, Time, Situation   Memory: Decreased short-term memory Following Commands: Follows one step commands with increased time Safety/Judgement: Decreased awareness of safety   Problem Solving: Slow processing, Difficulty sequencing, Requires verbal cues General Comments: increased cues for precautions, pt with delayed processing, overall pleasant and followign commands with increased time.Unaware bed linens soiled upon arrival     General Comments  VSS on RA    Exercises     Shoulder Instructions      Home Living Family/patient expects to be discharged to:: Skilled nursing facility Living Arrangements: Alone Available Help at Discharge: Family;Available PRN/intermittently Type of Home: Apartment Home Access: Level entry     Home Layout: One level     Bathroom Shower/Tub: Chief Strategy Officer: Standard Bathroom Accessibility: Yes   Home Equipment: Grab bars - tub/shower;BSC/3in1;Rolling Walker (2 wheels);Rollator (4 wheels);Cane - single point   Additional Comments: Brother helps as needed. 3 falls in the last couple of  weeks  Lives With: Alone    Prior Functioning/Environment Prior Level of Function : Needs assist;History of Falls (last six months)             Mobility Comments: Before recent SNF admission she was using rollator in apartment. Multiple falls reported. At SNF pt has been working on transfers and mobilizing herself in W/c. ADLs Comments: At home Brother would assist getting up after fall, states she could perform bath/dress herself. Cooks own meals. Since SNF requires assist with all ADLs.        OT Problem List: Decreased strength;Decreased activity tolerance;Impaired balance (sitting and/or standing);Decreased cognition;Decreased safety awareness;Decreased range of motion      OT Treatment/Interventions: Self-care/ADL training;Balance training;DME and/or AE instruction;Therapeutic activities;Patient/family education;Therapeutic exercise;Cognitive remediation/compensation;Energy conservation    OT Goals(Current goals can be found in the care plan section) Acute Rehab OT Goals Patient Stated Goal: none stated OT Goal Formulation: With patient Time For Goal Achievement: 12/02/22 Potential to Achieve Goals: Good ADL Goals Pt Will Perform Upper Body Dressing: with supervision;sitting Pt Will Perform Lower Body Dressing: with supervision;with adaptive equipment;sitting/lateral leans;sit to/from stand Pt Will Transfer to Toilet: with supervision;squat pivot transfer;stand pivot transfer;bedside commode Pt/caregiver will Perform Home Exercise Program: Both right and left upper extremity;With written HEP provided;Increased strength;Increased ROM;With Supervision Additional ADL Goal #1: pt will perform bed mobility mod I in prep for ADLs  OT Frequency: Min 1X/week    Co-evaluation              AM-PAC OT "6 Clicks" Daily Activity     Outcome Measure Help from another person eating meals?: A Little Help from another person taking care of personal grooming?: A Little Help from  another person toileting, which includes using toliet, bedpan, or urinal?: A Little Help from another  person bathing (including washing, rinsing, drying)?: A Lot Help from another person to put on and taking off regular upper body clothing?: A Little Help from another person to put on and taking off regular lower body clothing?: A Lot 6 Click Score: 16   End of Session Equipment Utilized During Treatment: Rolling walker (2 wheels);Gait belt;Left knee immobilizer Nurse Communication: Mobility status  Activity Tolerance: Patient tolerated treatment well Patient left: in chair;with call bell/phone within reach;with chair alarm set  OT Visit Diagnosis: Unsteadiness on feet (R26.81);Other abnormalities of gait and mobility (R26.89);History of falling (Z91.81);Muscle weakness (generalized) (M62.81)                Time: 8295-6213 OT Time Calculation (min): 21 min Charges:  OT General Charges $OT Visit: 1 Visit OT Evaluation $OT Eval Moderate Complexity: 1 Mod  Tarence Searcy K, OTD, OTR/L SecureChat Preferred Acute Rehab [phone]   Carver Fila Koonce 11/18/2022, 3:11 PM

## 2022-11-18 NOTE — NC FL2 (Signed)
[place] MEDICAID FL2 LEVEL OF CARE FORM     IDENTIFICATION  Patient Name: Erica Ball Birthdate: 1951/01/24 Sex: female Admission Date (Current Location): 11/16/2022  [facility] and [place] Number:  Producer, television/film/video and Address:  [facility], [address], [place], [place] [address]      Provider Number: [id number]  Attending Physician Name and Address:  Burnadette Pop, MD  Relative Name and Phone Number:  Elon Spanner [phone]    Current Level of Care: Hospital Recommended Level of Care: Skilled Nursing Facility Prior Approval Number:    Date Approved/Denied:   PASRR Number: [id number]  Discharge Plan: SNF    Current Diagnoses: Patient Active Problem List   Diagnosis Date Noted   SVT (supraventricular tachycardia) 11/04/2022   Demand ischemia 11/04/2022   Malnutrition of moderate degree 10/31/2022   Tibial plateau fracture 10/24/2022   Hyponatremia 10/24/2022   Hypomagnesemia 10/24/2022   Tibial plateau fracture, left, closed, initial encounter 10/23/2022   Fall at home, initial encounter 10/23/2022   COPD GOLD 0 / still smoking  12/02/2017   Elevated troponin 07/10/2017   Pleural effusion on right 07/10/2017   UTI (urinary tract infection) 07/10/2017   Sepsis (HCC) 07/10/2017   Lobar pneumonia (HCC) 07/10/2017   Acute kidney injury (HCC) 07/10/2017   Acute respiratory failure with hypoxia (HCC) 07/10/2017   Alcohol abuse    Tobacco abuse 08/20/2014   Hypertension 10/30/2011   Diverticulitis 10/30/2011   Vitamin D deficiency 10/30/2011    Orientation RESPIRATION BLADDER Height & Weight     Self, Time, Place  Normal Incontinent, External catheter Weight: 122 lb 12.7 oz (55.7 kg) Height:  5\' 8"  (172.7 cm)  BEHAVIORAL SYMPTOMS/MOOD NEUROLOGICAL BOWEL NUTRITION STATUS      Continent Diet (see discharge summary)  AMBULATORY STATUS COMMUNICATION OF NEEDS Skin   Total Care Verbally Normal                        Personal Care Assistance Level of Assistance    Bathing Assistance: Limited assistance Feeding assistance: Limited assistance Dressing Assistance: Limited assistance     Functional Limitations Info  Sight, Hearing, Speech Sight Info: Adequate Hearing Info: Adequate Speech Info: Adequate    SPECIAL CARE FACTORS FREQUENCY  PT (By licensed PT), OT (By licensed OT)     PT Frequency: 5x week OT Frequency: 5x week            Contractures Contractures Info: Not present    Additional Factors Info  Code Status, Allergies Code Status Info: full Allergies Info: Dextromethorphan           Current Medications (11/18/2022):  This is the current hospital active medication list Current Facility-Administered Medications  Medication Dose Route Frequency Provider Last Rate Last Admin   0.9 %  sodium chloride infusion   Intravenous Continuous Burnadette Pop, MD 75 mL/hr at 11/18/22 0901 New Bag at 11/18/22 0901   acetaminophen (TYLENOL) tablet 650 mg  650 mg Oral Q6H PRN Darlin Drop, DO   650 mg at 11/17/22 2017   albuterol (VENTOLIN HFA) 108 (90 Base) MCG/ACT inhaler 1-2 puff  1-2 puff Inhalation Q4H PRN Dow Adolph N, DO       cefTRIAXone (ROCEPHIN) 2 g in sodium chloride 0.9 % 100 mL IVPB  2 g Intravenous Q24H Hall, Carole N, DO 200 mL/hr at 11/18/22 0903 2 g at 11/18/22 0903   enoxaparin (LOVENOX) injection 40 mg  40 mg  Subcutaneous Q24H Dow Adolph N, DO   40 mg at 11/17/22 2007   feeding supplement (ENSURE ENLIVE / ENSURE PLUS) liquid 237 mL  237 mL Oral BID BM Burnadette Pop, MD   237 mL at 11/18/22 1427   folic acid (FOLVITE) tablet 1 mg  1 mg Oral Daily Dow Adolph N, DO   1 mg at 11/18/22 0855   hydrALAZINE (APRESOLINE) tablet 50 mg  50 mg Oral TID Burnadette Pop, MD   50 mg at 11/18/22 1424   labetalol (NORMODYNE) injection 10 mg  10 mg Intravenous Q2H PRN Burnadette Pop, MD   10 mg at 11/18/22 0332   melatonin tablet 5 mg  5 mg Oral QHS PRN Dow Adolph N, DO        metoprolol succinate (TOPROL-XL) 24 hr tablet 100 mg  100 mg Oral Daily Burnadette Pop, MD   100 mg at 11/18/22 0855   phenazopyridine (PYRIDIUM) tablet 100 mg  100 mg Oral TID WC Howerter, Justin B, DO   100 mg at 11/18/22 1215   polyethylene glycol (MIRALAX / GLYCOLAX) packet 17 g  17 g Oral Daily PRN Dow Adolph N, DO       prochlorperazine (COMPAZINE) injection 5 mg  5 mg Intravenous Q6H PRN Hall, Carole N, DO       rosuvastatin (CRESTOR) tablet 10 mg  10 mg Oral Daily Dow Adolph N, DO   10 mg at 11/18/22 1610   sodium chloride tablet 1 g  1 g Oral BID WC Adhikari, Amrit, MD       thiamine (VITAMIN B1) tablet 100 mg  100 mg Oral Daily Dow Adolph N, DO   100 mg at 11/18/22 9604     Discharge Medications: Please see discharge summary for a list of discharge medications.  Relevant Imaging Results:  Relevant Lab Results:   Additional Information SSN: [id number]  Lorri Frederick, LCSW

## 2022-11-18 NOTE — TOC Initial Note (Signed)
Transition of Care [facility]) - Initial/Assessment Note    Patient Details  Name: Erica Ball MRN: [id number] Date of Birth: [birth year]  Transition of Care [facility]) CM/SW Contact:    Lorri Frederick, LCSW Phone Number: 11/18/2022, 3:18 PM  Clinical Narrative:       CSW spoke with pt regarding PT recommendation for SNF.  Pt was discharged to [facility] at the beginning of July and was readmitted from [facility]. Pt stating she is not sure she wants to go back to [facility] because she got into an argument with someone there.  She is also wanting to see if DC home is a possibility.  Pt still NWB, lives alone.  We discussed that she would need significant help at home.  Pt does give permission to send out referral for SNF.  Pt will talk with her family members about potential help to DC home.  Permission given for CSW to speak with all sister Edd Arbour, son Fayrene Fearing, brother Tammy Sours.           Expected Discharge Plan: Skilled Nursing Facility Barriers to Discharge: Continued Medical Work up   Patient Goals and CMS Choice Patient states their goals for this hospitalization and ongoing recovery are:: get around again   Choice offered to / list presented to : Patient      Expected Discharge Plan and Services In-house Referral: Clinical Social Work   Post Acute Care Choice:  (pt wants to talk to family about DC home) Living arrangements for the past 2 months: Single Family Home                                      Prior Living Arrangements/Services Living arrangements for the past 2 months: Single Family Home Lives with:: Self Patient language and need for interpreter reviewed:: Yes Do you feel safe going back to the place where you live?: Yes      Need for Family Participation in Patient Care: Yes (Comment) Care giver support system in place?: Yes (comment) Current home services: Other (comment) (none) Criminal Activity/Legal Involvement Pertinent to Current  Situation/Hospitalization: No - Comment as needed  Activities of Daily Living Home Assistive Devices/Equipment: Walker (specify type) ADL Screening (condition at time of admission) Patient's cognitive ability adequate to safely complete daily activities?: Yes Is the patient deaf or have difficulty hearing?: Yes Does the patient have difficulty seeing, even when wearing glasses/contacts?: No Does the patient have difficulty concentrating, remembering, or making decisions?: No Patient able to express need for assistance with ADLs?: Yes Does the patient have difficulty dressing or bathing?: Yes Independently performs ADLs?: No Communication: Independent Dressing (OT): Needs assistance Is this a change from baseline?: Pre-admission baseline Grooming: Independent Feeding: Independent Bathing: Needs assistance Is this a change from baseline?: Pre-admission baseline Toileting: Needs assistance Is this a change from baseline?: Pre-admission baseline In/Out Bed: Needs assistance Is this a change from baseline?: Pre-admission baseline Walks in Home: Needs assistance Is this a change from baseline?: Pre-admission baseline Does the patient have difficulty walking or climbing stairs?: No Weakness of Legs: Both Weakness of Arms/Hands: None  Permission Sought/Granted Permission sought to share information with : Family Supports Permission granted to share information with : Yes, Verbal Permission Granted  Share Information with NAME: brother Tammy Sours, sister Edd Arbour, son Asher Muir  Permission granted to share info w AGENCY: SNF        Emotional Assessment Appearance:: Appears stated age  Attitude/Demeanor/Rapport: Engaged Affect (typically observed): Pleasant Orientation: : Oriented to Self, Oriented to Place, Oriented to  Time      Admission diagnosis:  Confusion [R41.0] Hyponatremia [E87.1] Elevated blood pressure reading [R03.0] Acute UTI [N39.0] Generalized weakness [R53.1] Patient  Active Problem List   Diagnosis Date Noted   SVT (supraventricular tachycardia) 11/04/2022   Demand ischemia 11/04/2022   Malnutrition of moderate degree 10/31/2022   Tibial plateau fracture 10/24/2022   Hyponatremia 10/24/2022   Hypomagnesemia 10/24/2022   Tibial plateau fracture, left, closed, initial encounter 10/23/2022   Fall at home, initial encounter 10/23/2022   COPD GOLD 0 / still smoking  12/02/2017   Elevated troponin 07/10/2017   Pleural effusion on right 07/10/2017   UTI (urinary tract infection) 07/10/2017   Sepsis (HCC) 07/10/2017   Lobar pneumonia (HCC) 07/10/2017   Acute kidney injury (HCC) 07/10/2017   Acute respiratory failure with hypoxia (HCC) 07/10/2017   Alcohol abuse    Tobacco abuse 08/20/2014   Hypertension 10/30/2011   Diverticulitis 10/30/2011   Vitamin D deficiency 10/30/2011   PCP:  Norm Salt, PA Pharmacy:   [facility] [phone] Gin[address] Otto, [place] - [address] AT [facility] [address] [place] [address] Phone: [phone] Fax: [phone]     Social Determinants of Health (SDOH) Social History: SDOH Screenings   Food Insecurity: No Food Insecurity (11/17/2022)  Housing: Low Risk  (11/17/2022)  Transportation Needs: No Transportation Needs (11/17/2022)  Utilities: Not At Risk (11/17/2022)  Alcohol Screen: Low Risk  (11/17/2022)  Depression (PHQ2-9): Low Risk  (11/17/2022)  Financial Resource Strain: Patient Unable To Answer (11/17/2022)  Physical Activity: Sufficiently Active (11/17/2022)  Social Connections: Socially Isolated (11/17/2022)  Stress: No Stress Concern Present (11/17/2022)  Tobacco Use: High Risk (11/16/2022)  Health Literacy: Inadequate Health Literacy (11/17/2022)   SDOH Interventions:     Readmission Risk Interventions     No data to display

## 2022-11-18 NOTE — Progress Notes (Signed)
PROGRESS NOTE  Erica Ball  ZOX:[id number] DOB: 31-Dec-1950 DOA: 11/16/2022 PCP: Norm Salt, PA   Brief Narrative: Patient is a 72 year old female with history of alcohol abuse, tobacco abuse, hypertension, COPD who presented from skilled nursing facility with complaint of generalized weakness, confusion compared to baseline.  Lab work at [facility] showed a sodium of 120 and was sent to the emergency department.  Sodium of 119 on presentation.  UA was suspicious for UTI.  Patient was started on IV fluids, ceftriaxone.  Assessment & Plan:  Principal Problem:   Hyponatremia  Hyponatremia: Suspected to be hypovolemic hyponatremia.  Urine sodium of just 17.  Continue IV fluids.  Renal function stable.  Continue to monitor urine sodium.  Avoid overcorrection.  Allow improvement by 6 - 8 mEq per 24 hours.  But this is acute hyponatremia so slight overcorrection may be considerable.  Her last sodium level was 130 on 6/29.  Likely from poor oral intake, dehydration.Started on salt tablets  Hypomagnesemia/hypokalemia: Currently being monitored and supplemented.  Suspected UTI: UA suspicious for UTI. Complains of some dysuria, Follow-up urine culture.  Continue ceftriaxone for now  History of alcohol use/tobacco use: Counseled cessation.  Continue thiamine, folic acid.  Drinks hard liquor every day, smokes half packs a day  Hypertension:On metoprolol,hydralazine.  Dose of hydralazine increased.  Hypokalemia/Hypomagnesia: Supplemented and being monitored  Hyperlipidemia: On Crestor  Recent history of left knee fracture: Follows with orthopedics.Has fractured patella.She was recommended NWB on left LE, and was supposed to  follow up with Orthopedics Dr Odis Hollingshead  Generalized weakness/deconditioning: Patient is from SNF.  PT/OT consulted here,recommended SnF.  Increased weakness from baseline likely secondary to UTI, hyponatremia        DVT prophylaxis:enoxaparin (LOVENOX) injection 40  mg Start: 11/16/22 2015     Code Status: Full Code  Family Communication: called and discussed with son on phone on 7/13  Patient status:Inpatient  Patient is from :SNF  Anticipated discharge to:SNF  Estimated DC date:1-2 days   Consultants: None  Procedures:None  Antimicrobials:  Anti-infectives (From admission, onward)    Start     Dose/Rate Route Frequency Ordered Stop   11/17/22 1000  cefTRIAXone (ROCEPHIN) 2 g in sodium chloride 0.9 % 100 mL IVPB        2 g 200 mL/hr over 30 Minutes Intravenous Every 24 hours 11/16/22 2014     11/16/22 2015  cefTRIAXone (ROCEPHIN) 1 g in sodium chloride 0.9 % 100 mL IVPB        1 g 200 mL/hr over 30 Minutes Intravenous  Once 11/16/22 2001 11/16/22 2043       Subjective: Patient seen and examined the bedside today.  Hemodynamically stable.  Lying in bed.  Appears weak and deconditioned but alert and oriented today.  Denies any complaints  Objective: Vitals:   11/18/22 0035 11/18/22 0412 11/18/22 0712 11/18/22 0855  BP:  (!) 156/69 (!) 146/87 (!) 163/90  Pulse: 68 60 72 65  Resp:  16 15   Temp:  97.7 F (36.5 C) 98.7 F (37.1 C)   TempSrc:  Oral Oral   SpO2: 100% 98%    Weight:      Height:        Intake/Output Summary (Last 24 hours) at 11/18/2022 1016 Last data filed at 11/18/2022 0414 Gross per 24 hour  Intake 615 ml  Output 4100 ml  Net -3485 ml   Filed Weights   11/17/22 0100  Weight: 55.7 kg    Examination:  General exam: Overall comfortable, not in distress,weak appearing  HEENT: PERRL Respiratory system:  no wheezes or crackles  Cardiovascular system: S1 & S2 heard, RRR.  Gastrointestinal system: Abdomen is nondistended, soft and nontender. Central nervous system: Alert and oriented Extremities: No edema, no clubbing ,no cyanosis,brace on the left knee Skin: No rashes, no ulcers,no icterus     Data Reviewed: I have personally reviewed following labs and imaging studies  CBC: Recent Labs  Lab  11/16/22 1846 11/17/22 0409  WBC 8.9 7.7  HGB 12.4 11.4*  HCT 34.7* 32.2*  MCV 93.3 93.1  PLT 375 320   Basic Metabolic Panel: Recent Labs  Lab 11/16/22 1846 11/17/22 0409 11/17/22 1143 11/17/22 1513 11/18/22 0201  NA 119* 124* 125* 125* 126*  K 3.8 3.3* 3.5 4.1 3.7  CL 84* 92* 88* 85* 93*  CO2 26 23 26 25 25   GLUCOSE 106* 88 105* 139* 104*  BUN 7* <5* <5* <5* <5*  CREATININE 0.61 0.50 0.49 0.53 0.50  CALCIUM 8.7* 8.5* 8.6* 8.8* 8.4*  MG  --  1.5*  --   --  1.6*  PHOS  --  3.4  --   --   --      No results found for this or any previous visit (from the past 240 hour(s)).   Radiology Studies: DG Chest Port 1 View  Result Date: 11/16/2022 CLINICAL DATA:  Weakness EXAM: PORTABLE CHEST 1 VIEW COMPARISON:  08/06/2017 FINDINGS: Chronic scarring at the right base. No acute airspace disease. Stable cardiomediastinal silhouette with aortic atherosclerosis.vague foci of opacity overlying right second anterior rib end as well as the left mid to lower thorax, question healing rib fractures. IMPRESSION: 1. No active disease. Chronic scarring at the right base. 2. Vague foci of bilateral opacity, question healing rib fractures. Consider correlation with chest CT to exclude lung parenchymal abnormality. Electronically Signed   By: Jasmine Pang M.D.   On: 11/16/2022 17:37    Scheduled Meds:  enoxaparin (LOVENOX) injection  40 mg Subcutaneous Q24H   feeding supplement  237 mL Oral BID BM   folic acid  1 mg Oral Daily   hydrALAZINE  25 mg Oral TID   metoprolol succinate  100 mg Oral Daily   phenazopyridine  100 mg Oral TID WC   rosuvastatin  10 mg Oral Daily   sodium chloride  2 g Oral BID WC   thiamine  100 mg Oral Daily   Continuous Infusions:  sodium chloride 75 mL/hr at 11/18/22 0901   cefTRIAXone (ROCEPHIN)  IV 2 g (11/18/22 0903)     LOS: 2 days   Burnadette Pop, MD [facility] P7/14/2024, 10:16 AM

## 2022-11-18 NOTE — Plan of Care (Signed)
  Problem: Education: Goal: Knowledge of disease or condition will improve Outcome: Progressing Goal: Knowledge of secondary prevention will improve (MUST DOCUMENT ALL) Outcome: Progressing   

## 2022-11-19 DIAGNOSIS — E871 Hypo-osmolality and hyponatremia: Secondary | ICD-10-CM | POA: Diagnosis not present

## 2022-11-19 LAB — BASIC METABOLIC PANEL
Anion gap: 4 — ABNORMAL LOW (ref 5–15)
BUN: 5 mg/dL — ABNORMAL LOW (ref 8–23)
CO2: 27 mmol/L (ref 22–32)
Calcium: 8.4 mg/dL — ABNORMAL LOW (ref 8.9–10.3)
Chloride: 95 mmol/L — ABNORMAL LOW (ref 98–111)
Creatinine, Ser: 0.48 mg/dL (ref 0.44–1.00)
GFR, Estimated: >60 mL/min (ref >60)
Glucose, Bld: 88 mg/dL (ref 70–99)
Potassium: 3.8 mmol/L (ref 3.5–5.1)
Sodium: 126 mmol/L — ABNORMAL LOW (ref 135–145)

## 2022-11-19 LAB — MAGNESIUM: Magnesium: 1.4 mg/dL — ABNORMAL LOW (ref 1.7–2.4)

## 2022-11-19 MED ORDER — SODIUM CHLORIDE 1 G PO TABS
2.0000 g | ORAL_TABLET | Freq: Two times a day (BID) | ORAL | Status: AC
  Administered 2022-11-19 – 2022-11-20 (×3): 2 g via ORAL
  Filled 2022-11-19 (×3): qty 2

## 2022-11-19 MED ORDER — MAGNESIUM SULFATE 2 GM/50ML IV SOLN
2.0000 g | Freq: Once | INTRAVENOUS | Status: AC
  Administered 2022-11-19: 2 g via INTRAVENOUS
  Filled 2022-11-19: qty 50

## 2022-11-19 MED ORDER — HYDRALAZINE HCL 50 MG PO TABS
75.0000 mg | ORAL_TABLET | Freq: Three times a day (TID) | ORAL | Status: DC
  Administered 2022-11-19 – 2022-11-26 (×22): 75 mg via ORAL
  Filled 2022-11-19 (×22): qty 1

## 2022-11-19 NOTE — Plan of Care (Signed)

## 2022-11-19 NOTE — TOC Progression Note (Addendum)
Transition of Care [facility]) - Progression Note    Patient Details  Name: Erica Ball MRN: [id number] Date of Birth: 03/24/1951  [facility] CM/SW Contact  Delilah Shan, LCSWA Phone Number: 11/19/2022, 12:11 PM  Clinical Narrative:     Due to patients current orientation CSW spoke with patients son Asher Muir. CSW discussed PT recommendation with patients son Asher Muir.CSW provided SNF bed offers to patients son Asher Muir. Asher Muir understands patient is hesitant about returning back to [facility] son confirms this is best plan for patient and is in agreement with patient returning back to [facility]. Shiela with [facility] informed CSW to follow up on bed availability if medically ready tomorrow.Shiela with facility requesting psych eval for patient. CSW informed MD. CSW started insurance authorization for patient. Insurance Auth ID# [id number]. Insurance authorization currently pending.CSW will continue to follow and assist with patients dc planning needs.   Expected Discharge Plan: Skilled Nursing Facility Barriers to Discharge: Continued Medical Work up  Expected Discharge Plan and Services In-house Referral: Clinical Social Work   Post Acute Care Choice:  (pt wants to talk to family about DC home) Living arrangements for the past 2 months: Single Family Home                                       Social Determinants of Health (SDOH) Interventions SDOH Screenings   Food Insecurity: No Food Insecurity (11/17/2022)  Housing: Low Risk  (11/17/2022)  Transportation Needs: No Transportation Needs (11/17/2022)  Utilities: Not At Risk (11/17/2022)  Alcohol Screen: Low Risk  (11/17/2022)  Depression (PHQ2-9): Low Risk  (11/17/2022)  Financial Resource Strain: Patient Unable To Answer (11/17/2022)  Physical Activity: Sufficiently Active (11/17/2022)  Social Connections: Socially Isolated (11/17/2022)  Stress: No Stress Concern Present (11/17/2022)  Tobacco Use: High  Risk (11/16/2022)  Health Literacy: Inadequate Health Literacy (11/17/2022)    Readmission Risk Interventions     No data to display

## 2022-11-19 NOTE — Plan of Care (Signed)
  Problem: Coping: Goal: Will verbalize positive feelings about self Outcome: Progressing

## 2022-11-19 NOTE — Progress Notes (Signed)
PROGRESS NOTE  Erica Ball  OZH:[id number] DOB: 08-Dec-1950 DOA: 11/16/2022 PCP: Norm Salt, PA   Brief Narrative: Patient is a 72 year old female with history of alcohol abuse, tobacco abuse, hypertension, COPD who presented from skilled nursing facility with complaint of generalized weakness, confusion compared to baseline.  Lab work at [facility] showed a sodium of 120 and was sent to the emergency department.  Sodium of 119 on presentation.  UA was suspicious for UTI.  Patient was started on IV fluids, ceftriaxone.  PT/OT recommending SNF.  Patient wants to go to different SNF.  TOC following.  Assessment & Plan:  Principal Problem:   Hyponatremia  Hyponatremia: Suspected to be hypovolemic hyponatremia.  Urine sodium of just 17.  Was given  IV fluids.  Renal function stable.   Her last sodium level was 130 on 6/29.  Hyponatremia could be  from poor oral intake, dehydration.now not responding to IV fluid.  Sodium 126 today.  Started on salt tablets.  Appears euvolemic  Hypomagnesemia/hypokalemia: Currently being monitored and supplemented.  Suspected UTI: UA suspicious for UTI. Complains of some dysuria, urine culture showed multiple cyst.  She  finished 3 days of antibiotics with ceftriaxone  History of alcohol use/tobacco use: Counseled cessation.  Continue thiamine, folic acid.  Drinks hard liquor every day, smokes half packs a day  Hypertension:On metoprolol,hydralazine.  Dose of hydralazine increased.  Hyperlipidemia: On Crestor  Recent history of left knee fracture: Reviewed last orthopedics note.  She was supposed to be following up with orthopedics.Has fractured patella.She was recommended NWB on left LE, and was supposed to  follow up with Orthopedics Dr Odis Hollingshead.  We strongly recommend to follow-up with orthopedics as an outpatient.  I do not think we need inpatient reconsult  Generalized weakness/deconditioning: Patient is from SNF.  PT/OT consulted here,recommended  SnF.  Increased weakness from baseline likely secondary to UTI, hyponatremia        DVT prophylaxis:enoxaparin (LOVENOX) injection 40 mg Start: 11/16/22 2015     Code Status: Full Code  Family Communication: called and discussed with son on phone on 7/13  Patient status:Inpatient  Patient is from :SNF  Anticipated discharge to:SNF  Estimated DC date:1-2 days   Consultants: None  Procedures:None  Antimicrobials:  Anti-infectives (From admission, onward)    Start     Dose/Rate Route Frequency Ordered Stop   11/17/22 1000  cefTRIAXone (ROCEPHIN) 2 g in sodium chloride 0.9 % 100 mL IVPB        2 g 200 mL/hr over 30 Minutes Intravenous Every 24 hours 11/16/22 2014 11/19/22 0931   11/16/22 2015  cefTRIAXone (ROCEPHIN) 1 g in sodium chloride 0.9 % 100 mL IVPB        1 g 200 mL/hr over 30 Minutes Intravenous  Once 11/16/22 2001 11/16/22 2043       Subjective: Patient seen and examined at bedside today.  Hemodynamically stable lying in bed.  Alert and oriented.  Comfortable.  Denies any complaints today.  She states she does not want to go back to the previous facility  Objective: Vitals:   11/19/22 0046 11/19/22 0440 11/19/22 0845 11/19/22 0851  BP: (!) 156/79  (!) 159/77   Pulse: 64 69 (!) 57 62  Resp: 17 17 18    Temp: 97.6 F (36.4 C) 97.9 F (36.6 C) 97.6 F (36.4 C)   TempSrc: Oral Oral Oral   SpO2: 94%  96%   Weight:      Height:  Intake/Output Summary (Last 24 hours) at 11/19/2022 1150 Last data filed at 11/19/2022 0848 Gross per 24 hour  Intake 2868.36 ml  Output 1050 ml  Net 1818.36 ml   Filed Weights   11/17/22 0100  Weight: 55.7 kg    Examination:   General exam: Overall comfortable, not in distress,weak appearing HEENT: PERRL Respiratory system:  no wheezes or crackles  Cardiovascular system: S1 & S2 heard, RRR.  Gastrointestinal system: Abdomen is nondistended, soft and nontender. Central nervous system: Alert and  oriented Extremities: No edema, no clubbing ,no cyanosis Skin: No rashes, no ulcers,no icterus       Data Reviewed: I have personally reviewed following labs and imaging studies  CBC: Recent Labs  Lab 11/16/22 1846 11/17/22 0409  WBC 8.9 7.7  HGB 12.4 11.4*  HCT 34.7* 32.2*  MCV 93.3 93.1  PLT 375 320   Basic Metabolic Panel: Recent Labs  Lab 11/17/22 0409 11/17/22 1143 11/17/22 1513 11/18/22 0201 11/19/22 0311  NA 124* 125* 125* 126* 126*  K 3.3* 3.5 4.1 3.7 3.8  CL 92* 88* 85* 93* 95*  CO2 23 26 25 25 27   GLUCOSE 88 105* 139* 104* 88  BUN <5* <5* <5* <5* 5*  CREATININE 0.50 0.49 0.53 0.50 0.48  CALCIUM 8.5* 8.6* 8.8* 8.4* 8.4*  MG 1.5*  --   --  1.6* 1.4*  PHOS 3.4  --   --   --   --      Recent Results (from the past 240 hour(s))  Urine Culture     Status: Abnormal   Collection Time: 11/16/22  8:04 PM   Specimen: Urine, Clean Catch  Result Value Ref Range Status   Specimen Description URINE, CLEAN CATCH  Final   Special Requests   Final    NONE Performed at [facility], [address] [place], [place] [address]    Culture MULTIPLE SPECIES PRESENT, SUGGEST RECOLLECTION (A)  Final   Report Status 11/18/2022 FINAL  Final     Radiology Studies: No results found.  Scheduled Meds:  enoxaparin (LOVENOX) injection  40 mg Subcutaneous Q24H   feeding supplement  237 mL Oral BID BM   folic acid  1 mg Oral Daily   hydrALAZINE  50 mg Oral TID   metoprolol succinate  100 mg Oral Daily   phenazopyridine  100 mg Oral TID WC   rosuvastatin  10 mg Oral Daily   sodium chloride  2 g Oral BID WC   thiamine  100 mg Oral Daily   Continuous Infusions:     LOS: 3 days   Burnadette Pop, MD [facility] P7/15/2024, 11:50 AM

## 2022-11-20 DIAGNOSIS — E871 Hypo-osmolality and hyponatremia: Secondary | ICD-10-CM | POA: Diagnosis not present

## 2022-11-20 LAB — MAGNESIUM: Magnesium: 1.8 mg/dL (ref 1.7–2.4)

## 2022-11-20 LAB — BASIC METABOLIC PANEL
Anion gap: 6 (ref 5–15)
BUN: 11 mg/dL (ref 8–23)
CO2: 24 mmol/L (ref 22–32)
Calcium: 8.4 mg/dL — ABNORMAL LOW (ref 8.9–10.3)
Chloride: 94 mmol/L — ABNORMAL LOW (ref 98–111)
Creatinine, Ser: 0.58 mg/dL (ref 0.44–1.00)
GFR, Estimated: >60 mL/min (ref >60)
Glucose, Bld: 99 mg/dL (ref 70–99)
Potassium: 4 mmol/L (ref 3.5–5.1)
Sodium: 124 mmol/L — ABNORMAL LOW (ref 135–145)

## 2022-11-20 LAB — OSMOLALITY: Osmolality: 268 mOsm/kg — ABNORMAL LOW (ref 275–295)

## 2022-11-20 MED ORDER — PANTOPRAZOLE SODIUM 40 MG PO TBEC
40.0000 mg | DELAYED_RELEASE_TABLET | Freq: Every day | ORAL | Status: DC
  Administered 2022-11-20 – 2022-11-29 (×10): 40 mg via ORAL
  Filled 2022-11-20 (×10): qty 1

## 2022-11-20 MED ORDER — ENSURE ENLIVE PO LIQD
237.0000 mL | Freq: Two times a day (BID) | ORAL | Status: DC
  Administered 2022-11-20 – 2022-11-23 (×8): 237 mL via ORAL

## 2022-11-20 MED ORDER — MAGNESIUM OXIDE -MG SUPPLEMENT 400 (240 MG) MG PO TABS
400.0000 mg | ORAL_TABLET | Freq: Every day | ORAL | Status: DC
  Administered 2022-11-20 – 2022-11-29 (×10): 400 mg via ORAL
  Filled 2022-11-20 (×10): qty 1

## 2022-11-20 NOTE — Consult Note (Signed)
[facility] Renal Consultation Note  Requesting MD:  Indication for Consultation: Hyponatremia  HPI:  Erica Ball is a 72 y.o. female with past medical history of alcohol use disorder, tobacco abuse, and COPD who presented from her skilled nursing facility after lab work showed a sodium level of 120. She states that she only eats one meal a day. She is also still drinking alcohol before hospitalization, one shot a day mixed with clam juice. Since being hospitalized, she was started on 75cc of NaCl, and sodium level came up to 126, but has since gone down to 124. Nephrology consulted for further evaluation.   Creat  Date/Time Value Ref Range Status  10/11/2011 03:29 PM 0.51 0.50 - 1.10 mg/dL Final   Creatinine, Ser  Date/Time Value Ref Range Status  11/19/2022 11:40 PM 0.58 0.44 - 1.00 mg/dL Final  16/02/9603 54:09 AM 0.48 0.44 - 1.00 mg/dL Final  81/19/1478 29:56 AM 0.50 0.44 - 1.00 mg/dL Final  21/30/8657 84:69 PM 0.53 0.44 - 1.00 mg/dL Final  62/95/2841 32:44 AM 0.49 0.44 - 1.00 mg/dL Final  05/09/7251 66:44 AM 0.50 0.44 - 1.00 mg/dL Final  03/47/4259 56:38 PM 0.61 0.44 - 1.00 mg/dL Final  75/64/3329 51:88 AM 0.53 0.44 - 1.00 mg/dL Final  41/66/0630 16:01 AM 0.55 0.44 - 1.00 mg/dL Final  09/32/3557 32:20 AM 0.45 0.44 - 1.00 mg/dL Final  25/42/7062 37:62 AM 0.38 (L) 0.44 - 1.00 mg/dL Final  83/15/1761 60:73 AM 0.52 0.44 - 1.00 mg/dL Final  71/10/2692 85:46 AM 0.53 0.44 - 1.00 mg/dL Final  27/07/5007 38:18 AM 0.53 0.44 - 1.00 mg/dL Final  29/93/7169 67:89 AM 0.51 0.44 - 1.00 mg/dL Final  38/02/1750 02:58 AM 0.60 0.44 - 1.00 mg/dL Final  52/77/8242 35:36 AM 0.54 0.44 - 1.00 mg/dL Final  14/43/1540 08:67 AM 0.57 0.44 - 1.00 mg/dL Final  61/95/0932 67:12 AM 0.57 0.44 - 1.00 mg/dL Final  45/80/9983 38:25 PM 0.75 0.44 - 1.00 mg/dL Final  05/39/7673 41:93 AM 0.44 0.44 - 1.00 mg/dL Final  79/06/4095 35:32 AM 0.48 0.44 - 1.00 mg/dL Final  99/24/2683 41:96 AM 0.40 (L)  0.44 - 1.00 mg/dL Final  22/29/7989 21:19 AM 0.58 0.44 - 1.00 mg/dL Final  41/74/0814 48:18 AM 0.48 0.44 - 1.00 mg/dL Final  56/31/4970 26:37 AM 0.54 0.44 - 1.00 mg/dL Final  85/88/5027 74:12 PM 0.67 0.44 - 1.00 mg/dL Final  87/86/7672 09:47 PM 1.09 (H) 0.44 - 1.00 mg/dL Final  09/62/8366 29:47 PM 0.85  Final     PMHx:   Past Medical History:  Diagnosis Date   Acute kidney injury (HCC) 07/10/2017   Acute respiratory failure with hypoxia (HCC) 07/10/2017   Alcohol abuse    Allergy    Cataract    Diverticulitis 10/30/2011   Elevated troponin 07/10/2017   Hypertension    Lobar pneumonia (HCC) 07/10/2017   Pleural effusion on right 07/10/2017   Sepsis (HCC) 07/10/2017   SVT (supraventricular tachycardia)    Tobacco abuse    UTI (urinary tract infection) 07/10/2017   Vitamin D deficiency 10/30/2011    Past Surgical History:  Procedure Laterality Date   ABDOMINAL HYSTERECTOMY     CATARACT EXTRACTION     FRACTURE SURGERY     IR THORACENTESIS ASP PLEURAL SPACE W/IMG GUIDE  07/11/2017   VIDEO ASSISTED THORACOSCOPY (VATS)/EMPYEMA Right 07/12/2017   Procedure: right VIDEO ASSISTED THORACOSCOPY (VATS) for drainage of EMPYEMA and decortication;  Surgeon: Loreli Slot, MD;  Location: [facility] OR;  Service: Thoracic;  Laterality: Right;   VIDEO BRONCHOSCOPY N/A 07/12/2017   Procedure: VIDEO BRONCHOSCOPY;  Surgeon: Loreli Slot, MD;  Location: [facility] OR;  Service: Thoracic;  Laterality: N/A;    Family Hx:  Family History  Problem Relation Age of Onset   Hypertension Mother    Heart disease Mother    Hypertension Father    Heart disease Father    Stroke Brother    Hypertension Brother    Heart disease Brother     Social History:  reports that she has been smoking cigarettes. She has a 79.5 pack-year smoking history. She has never used smokeless tobacco. She reports current alcohol use of about 21.0 standard drinks of alcohol per week. She reports that she does not use  drugs.  Allergies:  Allergies  Allergen Reactions   Dextromethorphan Other (See Comments)    Makes her very angry and she wants to hurt people    Medications: Prior to Admission medications   Medication Sig Start Date End Date Taking? Authorizing Provider  albuterol (PROVENTIL HFA;VENTOLIN HFA) 108 (90 Base) MCG/ACT inhaler Inhale 1-2 puffs into the lungs every 4 (four) hours as needed for shortness of breath. 07/10/17  Yes [provider]  aspirin EC 81 MG tablet Take 1 tablet (81 mg total) by mouth daily. 12/06/17  Yes Wendall Stade, MD  cephALEXin (KEFLEX) 500 MG capsule Take 500 mg by mouth 2 (two) times daily. 1 capsule twice daily for 7 days   Yes [provider]  folic acid (FOLVITE) 1 MG tablet Take 1 tablet (1 mg total) by mouth daily. 11/05/22  Yes Patel, Halina Maidens, MD  guaiFENesin (ROBITUSSIN) 100 MG/5ML liquid Take 10 mLs by mouth every 4 (four) hours as needed for cough or to loosen phlegm. 10 ml by mouth every 4 hours as needed for 48 hours.   Yes [provider]  hydrALAZINE (APRESOLINE) 25 MG tablet Take 25 mg by mouth 3 (three) times daily.   Yes [provider]  LORazepam (ATIVAN) 1 MG tablet Take 1 mg by mouth daily. For Increased Agitation   Yes [provider]  losartan (COZAAR) 100 MG tablet Take 1 tablet (100 mg total) by mouth daily. 11/06/22  Yes Amin, Loura Halt, MD  metoprolol succinate (TOPROL XL) 100 MG 24 hr tablet Take 1 tablet (100 mg total) by mouth daily. Take with or immediately following a meal. 11/04/22 11/04/23 Yes Patel, Halina Maidens, MD  rosuvastatin (CRESTOR) 10 MG tablet Take 1 tablet (10 mg total) by mouth daily. 11/05/22  Yes Cathleen Corti, MD  thiamine (VITAMIN B-1) 100 MG tablet Take 1 tablet (100 mg total) by mouth daily. 11/05/22  Yes Cathleen Corti, MD    I have reviewed the patient's current medications.  Labs:  Results for orders placed or performed during the hospital encounter of  11/16/22 (from the past 48 hour(s))  Basic metabolic panel     Status: Abnormal   Collection Time: 11/19/22  3:11 AM  Result Value Ref Range   Sodium 126 (L) 135 - 145 mmol/L   Potassium 3.8 3.5 - 5.1 mmol/L   Chloride 95 (L) 98 - 111 mmol/L   CO2 27 22 - 32 mmol/L   Glucose, Bld 88 70 - 99 mg/dL    Comment: Glucose reference range applies only to samples taken after fasting for at least 8 hours.   BUN 5 (L) 8 - 23 mg/dL   Creatinine, Ser 1.61 0.44 - 1.00 mg/dL   Calcium  8.4 (L) 8.9 - 10.3 mg/dL   GFR, Estimated >75 >64 mL/min    Comment: (NOTE) Calculated using the CKD-EPI Creatinine Equation (2021)    Anion gap 4 (L) 5 - 15    Comment: Performed at [facility], [address] [place], [place] [address]  Magnesium     Status: Abnormal   Collection Time: 11/19/22  3:11 AM  Result Value Ref Range   Magnesium 1.4 (L) 1.7 - 2.4 mg/dL    Comment: Performed at [facility], [address] [place], [place] [address]  Basic metabolic panel     Status: Abnormal   Collection Time: 11/19/22 11:40 PM  Result Value Ref Range   Sodium 124 (L) 135 - 145 mmol/L   Potassium 4.0 3.5 - 5.1 mmol/L   Chloride 94 (L) 98 - 111 mmol/L   CO2 24 22 - 32 mmol/L   Glucose, Bld 99 70 - 99 mg/dL    Comment: Glucose reference range applies only to samples taken after fasting for at least 8 hours.   BUN 11 8 - 23 mg/dL   Creatinine, Ser 6.60 0.44 - 1.00 mg/dL   Calcium 8.4 (L) 8.9 - 10.3 mg/dL   GFR, Estimated >63 >01 mL/min    Comment: (NOTE) Calculated using the CKD-EPI Creatinine Equation (2021)    Anion gap 6 5 - 15    Comment: Performed at [facility], [address], [place], [place] [address]  Magnesium     Status: None   Collection Time: 11/19/22 11:40 PM  Result Value Ref Range   Magnesium 1.8 1.7 - 2.4 mg/dL    Comment: Performed at [facility], [address] [place], [place] [address]     ROS:  Pertinent items are noted in HPI.  Physical Exam: Vitals:    11/20/22 0510 11/20/22 0810  BP: (!) 167/62 (!) 142/59  Pulse: (!) 57 62  Resp: 18   Temp: 98 F (36.7 C)   SpO2: 95%      General: Thin female, in no acute distress HEENT: Dry mucuous membrane Eyes: PERRLA Neck: No JVD appreciated Heart: Normal rate and rhythm  Lungs: Lungs clear to auscultation  Abdomen: Normoactive bowel sounds Extremities: No lower extremity edema seen Skin: Decreased skin turgor Neuro: AAOx4, no focal neurologic deficits  Assessment/Plan: Hyponatremia Low Solute Hyponatremia - Likely in the setting of low solute intake as per lab work pt seems malnourished. Current sodium level is 124, and fluids have been stopped. Encouraged patient to increase protein intake. Will add on ensure 2g twice a day. Will also recheck Urine sodium, osmolality, and serum osms given pt has been receiving fluid until yesterday for further evaluation of cause of hyponatremia. I do suspect that sodium will increase with increase in protein. Recommend 1.5L fluid restriction as well.    Genavie Boettger 11/20/2022, 10:55 AM

## 2022-11-20 NOTE — Progress Notes (Signed)
PROGRESS NOTE  Erica Ball  KGM:[id number] DOB: [birth year] DOA: 11/16/2022 PCP: Norm Salt, PA   Brief Narrative: Patient is a 72 year old female with history of alcohol abuse, tobacco abuse, hypertension, COPD who presented from skilled nursing facility with complaint of generalized weakness, confusion compared to baseline.  Lab work at [facility] showed a sodium of 120 and was sent to the emergency department.  Sodium of 119 on presentation.  UA was suspicious for UTI.  Patient was started on IV fluids, ceftriaxone.  PT/OT recommending SNF.  Hospital course remarkable for persistent hyponatremia, nephrology consulted today.  Possible discharge to SNF tomorrow if improvement in the sodium level.  Assessment & Plan:  Principal Problem:   Hyponatremia  Hyponatremia: Suspected to be hypovolemic hyponatremia.  Urine sodium of just 17.  Was given  IV fluids.  Renal function stable.   Her last sodium level was 130 on 6/29.  Hyponatremia could be  from poor oral intake, dehydration but did not respond significantly to  IV fluid.  Sodium 124 today.  Started on salt tablets.  Appears euvolemic.  Consulted nephrology today to help with persistent hyponatremia.  Hypomagnesemia/hypokalemia: Supplemented and corrected  Suspected UTI: UA suspicious for UTI. Complains of some dysuria, urine culture showed multiple cyst.  She  finished 3 days of antibiotics with ceftriaxone  History of alcohol use/tobacco use: Counseled cessation.  Continue thiamine, folic acid.  Drinks hard liquor every day, smokes half packs a day  Hypertension:On metoprolol,hydralazine.  Dose of hydralazine increased.  H/O SVT: Continue metoprolol  Hyperlipidemia: On Crestor  Recent history of left knee fracture: Reviewed last orthopedics note.  She was supposed to be following up with orthopedics.Has fractured patella.She was recommended NWB on left LE, and was supposed to  follow up with Orthopedics Dr Odis Hollingshead.  We  strongly recommend to follow-up with orthopedics as an outpatient.  I do not think we need inpatient re-consult  Generalized weakness/deconditioning: Patient is from SNF.  PT/OT consulted here,recommended SnF.    Has a bed at [facility] tomorrow.  Skilled nursing facility had concern regarding her mental health saying that she had suicidal ideations in the past.  Patient strictly denies any suicidal ideation at present       DVT prophylaxis:enoxaparin (LOVENOX) injection 40 mg Start: 11/16/22 2015     Code Status: Full Code  Family Communication: called and discussed with son on phone on 7/16  Patient status:Inpatient  Patient is from :SNF  Anticipated discharge to:SNF  Estimated DC date: Likely tomorrow if improvement in the sodium level   Consultants: Nephrology  Procedures:None  Antimicrobials:  Anti-infectives (From admission, onward)    Start     Dose/Rate Route Frequency Ordered Stop   11/17/22 1000  cefTRIAXone (ROCEPHIN) 2 g in sodium chloride 0.9 % 100 mL IVPB        2 g 200 mL/hr over 30 Minutes Intravenous Every 24 hours 11/16/22 2014 11/19/22 1336   11/16/22 2015  cefTRIAXone (ROCEPHIN) 1 g in sodium chloride 0.9 % 100 mL IVPB        1 g 200 mL/hr over 30 Minutes Intravenous  Once 11/16/22 2001 11/16/22 2043       Subjective: Patient seen and examined at bedside today.  Appears overall comfortable but weak and deconditioned.  On room air.  Denies any nausea, vomiting or abdominal pain.  She had some bottles of soda on the tray.  I recommend her to consider tapering too much water/soda  Objective: Vitals:   11/20/22  0000 11/20/22 0400 11/20/22 0510 11/20/22 0810  BP:   (!) 167/62 (!) 142/59  Pulse:   (!) 57 62  Resp:   18   Temp:   98 F (36.7 C)   TempSrc:   Oral   SpO2: 96% 97% 95%   Weight:      Height:        Intake/Output Summary (Last 24 hours) at 11/20/2022 1259 Last data filed at 11/20/2022 0500 Gross per 24 hour  Intake 100 ml  Output 500 ml   Net -400 ml   Filed Weights   11/17/22 0100  Weight: 55.7 kg    Examination:   General exam: Overall comfortable, not in distress,weak and deconditioned HEENT: PERRL Respiratory system:  no wheezes or crackles  Cardiovascular system: S1 & S2 heard, RRR.  Gastrointestinal system: Abdomen is nondistended, soft and nontender. Central nervous system: Alert and oriented Extremities: No edema, no clubbing ,no cyanosis Skin: No rashes, no ulcers,no icterus       Data Reviewed: I have personally reviewed following labs and imaging studies  CBC: Recent Labs  Lab 11/16/22 1846 11/17/22 0409  WBC 8.9 7.7  HGB 12.4 11.4*  HCT 34.7* 32.2*  MCV 93.3 93.1  PLT 375 320   Basic Metabolic Panel: Recent Labs  Lab 11/17/22 0409 11/17/22 1143 11/17/22 1513 11/18/22 0201 11/19/22 0311 11/19/22 2340  NA 124* 125* 125* 126* 126* 124*  K 3.3* 3.5 4.1 3.7 3.8 4.0  CL 92* 88* 85* 93* 95* 94*  CO2 23 26 25 25 27 24   GLUCOSE 88 105* 139* 104* 88 99  BUN <5* <5* <5* <5* 5* 11  CREATININE 0.50 0.49 0.53 0.50 0.48 0.58  CALCIUM 8.5* 8.6* 8.8* 8.4* 8.4* 8.4*  MG 1.5*  --   --  1.6* 1.4* 1.8  PHOS 3.4  --   --   --   --   --      Recent Results (from the past 240 hour(s))  Urine Culture     Status: Abnormal   Collection Time: 11/16/22  8:04 PM   Specimen: Urine, Clean Catch  Result Value Ref Range Status   Specimen Description URINE, CLEAN CATCH  Final   Special Requests   Final    NONE Performed at [facility], [address] [place], [place] [address]    Culture MULTIPLE SPECIES PRESENT, SUGGEST RECOLLECTION (A)  Final   Report Status 11/18/2022 FINAL  Final     Radiology Studies: No results found.  Scheduled Meds:  enoxaparin (LOVENOX) injection  40 mg Subcutaneous Q24H   feeding supplement  237 mL Oral BID BM   folic acid  1 mg Oral Daily   hydrALAZINE  75 mg Oral TID   metoprolol succinate  100 mg Oral Daily   phenazopyridine  100 mg Oral TID WC    rosuvastatin  10 mg Oral Daily   sodium chloride  2 g Oral BID WC   thiamine  100 mg Oral Daily   Continuous Infusions:     LOS: 4 days   Burnadette Pop, MD [facility] P7/16/2024, 12:59 PM

## 2022-11-20 NOTE — Progress Notes (Signed)
Physical Therapy Treatment Patient Details Name: Erica Ball MRN: [id number] DOB: [birth year] Today's Date: 11/20/2022   History of Present Illness 72 year old female admitted 7/12 from SNF with hyponatremia, Hypomagnesemia/hypokalemia, and suspected UTI. Recent admission 6/18 after fall, found to have left tibial plateau fracture. PMHx: alcohol abuse, tobacco abuse, HTN, COPD, mobility issues    PT Comments  Pt reporting min LLE pain, but agreeable to OOB mobility. Pt continues to requiring cuing for NWB LLE and safe mobility with RW. Pt requiring light physical assist to perform transfer-level mobility. Pt tolerated bilat LE exercises (safely within precautions and in KI) well, encouraged pt to wear KI at all times given order. PT to continue to follow.    Assistance Recommended at Discharge Frequent or constant Supervision/Assistance  If plan is discharge home, recommend the following:  Can travel by private vehicle    Assist for transportation;Help with stairs or ramp for entrance;A lot of help with walking and/or transfers;A lot of help with bathing/dressing/bathroom   No  Equipment Recommendations  None recommended by PT    Recommendations for Other Services       Precautions / Restrictions Precautions Precautions: Fall Required Braces or Orthoses: Knee Immobilizer - Left Knee Immobilizer - Left: On at all times Other Brace: KI off upon PT arrival to room, states it was soiled and is drying in the bathroom - PT reapplied Restrictions RLE Weight Bearing: Weight bearing as tolerated LLE Weight Bearing: Non weight bearing     Mobility  Bed Mobility Overal bed mobility: Needs Assistance Bed Mobility: Supine to Sit     Supine to sit: Min assist, HOB elevated     General bed mobility comments: assist for LLE progression to EOB, increased time and sequencing cues    Transfers Overall transfer level: Needs assistance Equipment used: Rolling walker (2  wheels) Transfers: Sit to/from Stand, Bed to chair/wheelchair/BSC Sit to Stand: Min assist Stand pivot transfers: Min assist         General transfer comment: assist to rise, steady, cues for NWB LLE and pivot on RLE. Increased time to perform    Ambulation/Gait               General Gait Details: unable   Stairs             Wheelchair Mobility     Tilt Bed    Modified Rankin (Stroke Patients Only)       Balance Overall balance assessment: Needs assistance, History of Falls Sitting-balance support: No upper extremity supported, Feet supported Sitting balance-Leahy Scale: Fair Sitting balance - Comments: sitting EOB   Standing balance support: During functional activity, Bilateral upper extremity supported, Reliant on assistive device for balance Standing balance-Leahy Scale: Poor Standing balance comment: with RW and assist for balance                            Cognition Arousal/Alertness: Awake/alert Behavior During Therapy: WFL for tasks assessed/performed Overall Cognitive Status: Impaired/Different from baseline Area of Impairment: Memory, Following commands, Safety/judgement, Problem solving                     Memory: Decreased short-term memory Following Commands: Follows one step commands with increased time Safety/Judgement: Decreased awareness of safety   Problem Solving: Difficulty sequencing, Requires verbal cues          Exercises General Exercises - Lower Extremity Ankle Circles/Pumps: AROM, Both, Seated Hip ABduction/ADduction: AAROM, Both,  10 reps, Seated Straight Leg Raises: AAROM, Both, 10 reps, Seated    General Comments        Pertinent Vitals/Pain Pain Assessment Pain Assessment: Faces Faces Pain Scale: Hurts little more Pain Location: LLE with mobility Pain Descriptors / Indicators: Discomfort, Sore Pain Intervention(s): Limited activity within patient's tolerance, Monitored during session,  Repositioned    Home Living                          Prior Function            PT Goals (current goals can now be found in the care plan section) Acute Rehab PT Goals Patient Stated Goal: Back to rehab, regain independence PT Goal Formulation: With patient Time For Goal Achievement: 12/01/22 Potential to Achieve Goals: Good Progress towards PT goals: Progressing toward goals    Frequency    Min 1X/week      PT Plan Current plan remains appropriate    Co-evaluation              AM-PAC PT "6 Clicks" Mobility   Outcome Measure  Help needed turning from your back to your side while in a flat bed without using bedrails?: A Little Help needed moving from lying on your back to sitting on the side of a flat bed without using bedrails?: A Little Help needed moving to and from a bed to a chair (including a wheelchair)?: A Lot Help needed standing up from a chair using your arms (e.g., wheelchair or bedside chair)?: A Little Help needed to walk in hospital room?: Total Help needed climbing 3-5 steps with a railing? : Total 6 Click Score: 13    End of Session Equipment Utilized During Treatment: Left knee immobilizer Activity Tolerance: Patient tolerated treatment well Patient left: with call bell/phone within reach;in chair;with chair alarm set Nurse Communication: Mobility status PT Visit Diagnosis: Unsteadiness on feet (R26.81);Muscle weakness (generalized) (M62.81);History of falling (Z91.81);Difficulty in walking, not elsewhere classified (R26.2);Pain Pain - Right/Left: Left Pain - part of body: Knee;Leg     Time: 1202-1218 PT Time Calculation (min) (ACUTE ONLY): 16 min  Charges:    $Therapeutic Activity: 8-22 mins PT General Charges $$ ACUTE PT VISIT: 1 Visit                     Marye Round, PT DPT Acute Rehabilitation Services Secure Chat Preferred  Office [phone]     Aulani Shipton E Christain [place] 11/20/2022, 2:51 PM

## 2022-11-20 NOTE — Plan of Care (Signed)
  Problem: Education: Goal: Knowledge of secondary prevention will improve (MUST DOCUMENT ALL) Outcome: Progressing   Problem: Coping: Goal: Will identify appropriate support needs Outcome: Progressing   Problem: Health Behavior/Discharge Planning: Goal: Goals will be collaboratively established with patient/family Outcome: Progressing   Problem: Self-Care: Goal: Ability to participate in self-care as condition permits will improve Outcome: Progressing Goal: Verbalization of feelings and concerns over difficulty with self-care will improve Outcome: Progressing Goal: Ability to communicate needs accurately will improve Outcome: Progressing   Problem: Activity: Goal: Risk for activity intolerance will decrease Outcome: Progressing   Problem: Nutrition: Goal: Adequate nutrition will be maintained Outcome: Progressing   Problem: Pain Managment: Goal: General experience of comfort will improve Outcome: Progressing   Problem: Skin Integrity: Goal: Risk for impaired skin integrity will decrease Outcome: Progressing

## 2022-11-20 NOTE — Care Management Important Message (Signed)
Important Message  Patient Details  Name: Erica Ball MRN: [id number] Date of Birth: [birth year]   Medicare Important Message Given:  Yes     Renie Ora 11/20/2022, 8:35 AM

## 2022-11-20 NOTE — TOC Progression Note (Signed)
Transition of Care [facility]) - Progression Note    Patient Details  Name: Erica Ball MRN: [id number] Date of Birth: [birth year]  Transition of Care [facility]) CM/SW Contact  Delilah Shan, LCSWA Phone Number: 11/20/2022, 9:40 AM  Clinical Narrative:     Patients insurance authorization has been approved for SNF. Plan Auth ID# [id number]. Auth ID# [id number]. Insurance authorization has been approved from 7/16-7/18.CSW informed MD. CSW spoke with [place] with [facility] who confirmed they can accept patient tomorrow, if medically ready for dc. CSW will continue to follow and assist with patients dc planning needs.  Expected Discharge Plan: Skilled Nursing Facility Barriers to Discharge: Continued Medical Work up  Expected Discharge Plan and Services In-house Referral: Clinical Social Work   Post Acute Care Choice:  (pt wants to talk to family about DC home) Living arrangements for the past 2 months: Single Family Home                                       Social Determinants of Health (SDOH) Interventions SDOH Screenings   Food Insecurity: No Food Insecurity (11/17/2022)  Housing: Low Risk  (11/17/2022)  Transportation Needs: No Transportation Needs (11/17/2022)  Utilities: Not At Risk (11/17/2022)  Alcohol Screen: Low Risk  (11/17/2022)  Depression (PHQ2-9): Low Risk  (11/17/2022)  Financial Resource Strain: Patient Unable To Answer (11/17/2022)  Physical Activity: Sufficiently Active (11/17/2022)  Social Connections: Socially Isolated (11/17/2022)  Stress: No Stress Concern Present (11/17/2022)  Tobacco Use: High Risk (11/16/2022)  Health Literacy: Inadequate Health Literacy (11/17/2022)    Readmission Risk Interventions     No data to display

## 2022-11-21 DIAGNOSIS — E871 Hypo-osmolality and hyponatremia: Secondary | ICD-10-CM | POA: Diagnosis not present

## 2022-11-21 DIAGNOSIS — R531 Weakness: Secondary | ICD-10-CM

## 2022-11-21 LAB — BASIC METABOLIC PANEL
Anion gap: 5 (ref 5–15)
Anion gap: 6 (ref 5–15)
BUN: 12 mg/dL (ref 8–23)
BUN: 9 mg/dL (ref 8–23)
CO2: 25 mmol/L (ref 22–32)
CO2: 27 mmol/L (ref 22–32)
Calcium: 8.7 mg/dL — ABNORMAL LOW (ref 8.9–10.3)
Calcium: 8.8 mg/dL — ABNORMAL LOW (ref 8.9–10.3)
Chloride: 90 mmol/L — ABNORMAL LOW (ref 98–111)
Chloride: 93 mmol/L — ABNORMAL LOW (ref 98–111)
Creatinine, Ser: 0.49 mg/dL (ref 0.44–1.00)
Creatinine, Ser: 0.51 mg/dL (ref 0.44–1.00)
GFR, Estimated: >60 mL/min (ref >60)
GFR, Estimated: >60 mL/min (ref >60)
Glucose, Bld: 116 mg/dL — ABNORMAL HIGH (ref 70–99)
Glucose, Bld: 96 mg/dL (ref 70–99)
Potassium: 3.9 mmol/L (ref 3.5–5.1)
Potassium: 4 mmol/L (ref 3.5–5.1)
Sodium: 122 mmol/L — ABNORMAL LOW (ref 135–145)
Sodium: 124 mmol/L — ABNORMAL LOW (ref 135–145)

## 2022-11-21 LAB — URINALYSIS, ROUTINE W REFLEX MICROSCOPIC
Bacteria, UA: NONE SEEN
Bilirubin Urine: NEGATIVE
Glucose, UA: NEGATIVE mg/dL
Hgb urine dipstick: NEGATIVE
Ketones, ur: NEGATIVE mg/dL
Leukocytes,Ua: NEGATIVE
Nitrite: POSITIVE — AB
Protein, ur: NEGATIVE mg/dL
Specific Gravity, Urine: 1.015 (ref 1.005–1.030)
pH: 5 (ref 5.0–8.0)

## 2022-11-21 LAB — SODIUM, URINE, RANDOM: Sodium, Ur: 45 mmol/L

## 2022-11-21 LAB — OSMOLALITY, URINE: Osmolality, Ur: 470 mOsm/kg (ref 300–900)

## 2022-11-21 NOTE — Plan of Care (Signed)

## 2022-11-21 NOTE — TOC Progression Note (Addendum)
Transition of Care [facility]) - Progression Note    Patient Details  Name: Erica Ball MRN: [id number] Date of Birth: [birth year]  Transition of Care [facility]) CM/SW Contact  Delilah Shan, LCSWA Phone Number: 11/21/2022, 11:23 AM  Clinical Narrative:     MD informed CSW that patient not medically ready today. Facility updated.Patient has SNF bed at [facility] when medically ready. Insurance authorization approved through 7/18. CSW will continue to follow and assist with patients dc planning needs.  Expected Discharge Plan: Skilled Nursing Facility Barriers to Discharge: Continued Medical Work up  Expected Discharge Plan and Services In-house Referral: Clinical Social Work   Post Acute Care Choice:  (pt wants to talk to family about DC home) Living arrangements for the past 2 months: Single Family Home                                       Social Determinants of Health (SDOH) Interventions SDOH Screenings   Food Insecurity: No Food Insecurity (11/17/2022)  Housing: Low Risk  (11/17/2022)  Transportation Needs: No Transportation Needs (11/17/2022)  Utilities: Not At Risk (11/17/2022)  Alcohol Screen: Low Risk  (11/17/2022)  Depression (PHQ2-9): Low Risk  (11/17/2022)  Financial Resource Strain: Patient Unable To Answer (11/17/2022)  Physical Activity: Sufficiently Active (11/17/2022)  Social Connections: Socially Isolated (11/17/2022)  Stress: No Stress Concern Present (11/17/2022)  Tobacco Use: High Risk (11/16/2022)  Health Literacy: Inadequate Health Literacy (11/17/2022)    Readmission Risk Interventions     No data to display

## 2022-11-21 NOTE — Progress Notes (Signed)
Subjective:   Objective Vital signs in last 24 hours: Vitals:   11/20/22 2025 11/21/22 0438 11/21/22 0802 11/21/22 0815  BP: 131/62 (P) 135/69 (!) 152/54 (!) 152/54  Pulse: (!) 56 (P) 61 (!) 50   Resp: 18 (P) 17 18   Temp: 98.5 F (36.9 C) (P) 97.8 F (36.6 C) 98.2 F (36.8 C)   TempSrc: Oral (P) Oral Oral   SpO2: 96% (P) 95% 95%   Weight:      Height:       Weight change:   Intake/Output Summary (Last 24 hours) at 11/21/2022 1316 Last data filed at 11/21/2022 0318 Gross per 24 hour  Intake --  Output 150 ml  Net -150 ml    Assessment/ Plan: Pt is a 72 y.o. yo female who was admitted on 11/16/2022 with  hyponatremia  Assessment/Plan: 1. 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 Current sodium at 122, encouraged patient to continue eating protein and drinking ensure. Yesterday she states she missed her food order time and did not eat much, which could be responsible for patient's sodium decreasing. Will place on fluid restriction. If sodium does not trend in the right direction tomorrow, will treat with urea.    Radley Barto  PGY-2   Labs: Basic Metabolic Panel: Recent Labs  Lab 11/17/22 0409 11/17/22 1143 11/19/22 2340 11/20/22 2343 11/21/22 0944  NA 124*   < > 124* 124* 122*  K 3.3*   < > 4.0 3.9 4.0  CL 92*   < > 94* 93* 90*  CO2 23   < > 24 25 27   GLUCOSE 88   < > 99 96 116*  BUN <5*   < > 11 12 9   CREATININE 0.50   < > 0.58 0.49 0.51  CALCIUM 8.5*   < > 8.4* 8.7* 8.8*  PHOS 3.4  --   --   --   --    < > = values in this interval not displayed.   Liver Function Tests: Recent Labs  Lab 11/16/22 1846  AST 17  ALT 14  ALKPHOS 50  BILITOT 0.6  PROT 6.3*  ALBUMIN 3.5   No results for input(s): "LIPASE", "AMYLASE" in the last 168 hours. No results for input(s): "AMMONIA" in the last 168 hours. CBC: Recent Labs  Lab 11/16/22 1846 11/17/22 0409  WBC 8.9 7.7  HGB 12.4 11.4*  HCT  34.7* 32.2*  MCV 93.3 93.1  PLT 375 320   Cardiac Enzymes: No results for input(s): "CKTOTAL", "CKMB", "CKMBINDEX", "TROPONINI" in the last 168 hours. CBG: No results for input(s): "GLUCAP" in the last 168 hours.  Iron Studies: No results for input(s): "IRON", "TIBC", "TRANSFERRIN", "FERRITIN" in the last 72 hours. Studies/Results: No results found. Medications: Infusions:   Scheduled Medications:  enoxaparin (LOVENOX) injection  40 mg Subcutaneous Q24H   feeding supplement  237 mL Oral BID BM   folic acid  1 mg Oral Daily   hydrALAZINE  75 mg Oral TID   magnesium oxide  400 mg Oral Daily   metoprolol succinate  100 mg Oral Daily   pantoprazole  40 mg Oral Daily   rosuvastatin  10 mg Oral Daily   thiamine  100 mg Oral Daily    have reviewed scheduled and prn medications.  Physical Exam: General: Heart: Lungs: Abdomen: Extremities: Dialysis Access: NA    11/21/2022,1:16 PM  LOS: 5 days

## 2022-11-21 NOTE — Progress Notes (Signed)
PROGRESS NOTE  Erica Ball AOZ:[id number] DOB: 08/10/1950 DOA: 11/16/2022 PCP: Norm Salt, PA  Brief History:  72 year old female with history of alcohol abuse, tobacco abuse, hypertension, COPD who presented from skilled nursing facility with complaint of generalized weakness, confusion compared to baseline.  Lab work at [facility] showed a sodium of 120 and was sent to the emergency department.  Sodium of 119 on presentation.  UA was suspicious for UTI.  Patient was started on IV fluids, ceftriaxone.  PT/OT recommending SNF.  Hospital course remarkable for persistent hyponatremia, nephrology consulted due to persistent hyponatremia/drop in Na   Assessment/Plan: Hyponatremia:  -Suspected to be euvolemic hyponatremia.   -Urine sodium of just 17.   -Was given  IV fluids--no improvement.     -last sodium level was 130 on 6/29. 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   -finished 3 days of antibiotics with ceftriaxone   History of alcohol use/tobacco use: Counseled cessation.  Continue thiamine, folic acid.   -Drinks hard liquor every day, smokes half packs a day   Hypertension: -metoprolol,hydralazine.   -Dose of hydralazine increased this hospitalization   H/O SVT:  -Continue metoprolol -currently in sinus   Hyperlipidemia: On Crestor   Recent history of left knee fracture: Reviewed last orthopedics note.  She was supposed to be following up with orthopedics.Has fractured patella.She was recommended NWB on left LE, and was supposed to  follow up with Orthopedics Dr Odis Hollingshead.  We strongly recommend to follow-up with orthopedics as an outpatient.  I do not think we need inpatient re-consult    Generalized weakness/deconditioning: Patient is from SNF.  PT/OT consulted here,recommended SnF.    Has a bed at [facility] tomorrow.  Skilled nursing facility had concern regarding her mental health saying that she had suicidal ideations in the past.  Patient strictly denies any suicidal ideation at present            Family Communication: no  Family at bedside  Consultants:  renal  Code Status:  FULL   DVT Prophylaxis:  [place] Lovenox   Procedures: As Listed in Progress Note Above  Antibiotics: None        Subjective: Patient denies fevers, chills, headache, chest pain, dyspnea, nausea, vomiting, diarrhea, abdominal pain, dysuria, hematuria, hematochezia, and melena.   Objective: Vitals:   11/21/22 0802 11/21/22 0815 11/21/22 1357 11/21/22 1552  BP: (!) 152/54 (!) 152/54 128/62   Pulse: (!) 50     Resp: 18   18  Temp: 98.2 F (36.8 C)   98 F (36.7 C)  TempSrc: Oral   Oral  SpO2: 95%     Weight:      Height:        Intake/Output Summary (Last 24 hours) at 11/21/2022 1814 Last data filed at 11/21/2022 0318 Gross per 24 hour  Intake --  Output 150 ml  Net -150 ml   Weight change:  Exam:  General:  Pt is alert, follows commands appropriately, not in acute distress HEENT: No icterus, No thrush, No neck mass, [place]/AT Cardiovascular: RRR, S1/S2, no rubs, no gallops Respiratory: CTA bilaterally, no wheezing, no crackles, no rhonchi Abdomen: Soft/+BS, non tender, non distended, no guarding Extremities: No edema, No  lymphangitis, No petechiae, No rashes, no synovitis   Data Reviewed: I have personally reviewed following labs and imaging studies Basic Metabolic Panel: Recent Labs  Lab 11/17/22 0409 11/17/22 1143 11/18/22 0201 11/19/22 0311 11/19/22 2340 11/20/22 2343 11/21/22 0944  NA 124*   < > 126* 126* 124* 124* 122*  K 3.3*   < > 3.7 3.8 4.0 3.9 4.0  CL 92*   < > 93* 95* 94* 93* 90*  CO2 23   < > 25 27 24 25 27   GLUCOSE 88   < > 104* 88 99 96 116*   BUN <5*   < > <5* 5* 11 12 9   CREATININE 0.50   < > 0.50 0.48 0.58 0.49 0.51  CALCIUM 8.5*   < > 8.4* 8.4* 8.4* 8.7* 8.8*  MG 1.5*  --  1.6* 1.4* 1.8  --   --   PHOS 3.4  --   --   --   --   --   --    < > = values in this interval not displayed.   Liver Function Tests: Recent Labs  Lab 11/16/22 1846  AST 17  ALT 14  ALKPHOS 50  BILITOT 0.6  PROT 6.3*  ALBUMIN 3.5   No results for input(s): "LIPASE", "AMYLASE" in the last 168 hours. No results for input(s): "AMMONIA" in the last 168 hours. Coagulation Profile: No results for input(s): "INR", "PROTIME" in the last 168 hours. CBC: Recent Labs  Lab 11/16/22 1846 11/17/22 0409  WBC 8.9 7.7  HGB 12.4 11.4*  HCT 34.7* 32.2*  MCV 93.3 93.1  PLT 375 320   Cardiac Enzymes: No results for input(s): "CKTOTAL", "CKMB", "CKMBINDEX", "TROPONINI" in the last 168 hours. BNP: Invalid input(s): "POCBNP" CBG: No results for input(s): "GLUCAP" in the last 168 hours. HbA1C: No results for input(s): "HGBA1C" in the last 72 hours. Urine analysis:    Component Value Date/Time   COLORURINE AMBER (A) 11/21/2022 0351   APPEARANCEUR CLEAR 11/21/2022 0351   LABSPEC 1.015 11/21/2022 0351   PHURINE 5.0 11/21/2022 0351   GLUCOSEU NEGATIVE 11/21/2022 0351   HGBUR NEGATIVE 11/21/2022 0351   BILIRUBINUR NEGATIVE 11/21/2022 0351   BILIRUBINUR neg 10/11/2011 1425   KETONESUR NEGATIVE 11/21/2022 0351   PROTEINUR NEGATIVE 11/21/2022 0351   UROBILINOGEN 0.2 10/11/2011 1425   UROBILINOGEN 0.2 01/24/2008 2217   NITRITE POSITIVE (A) 11/21/2022 0351   LEUKOCYTESUR NEGATIVE 11/21/2022 0351   Sepsis Labs: @LABRCNTIP (procalcitonin:4,lacticidven:4) ) Recent Results (from the past 240 hour(s))  Urine Culture     Status: Abnormal   Collection Time: 11/16/22  8:04 PM   Specimen: Urine, Clean Catch  Result Value Ref Range Status   Specimen Description URINE, CLEAN CATCH  Final   Special Requests   Final    NONE Performed at [facility]  Lab, [address] [place], [place] [address]    Culture MULTIPLE SPECIES PRESENT, SUGGEST RECOLLECTION (A)  Final   Report Status 11/18/2022 FINAL  Final     Scheduled Meds:  enoxaparin (LOVENOX) injection  40 mg Subcutaneous Q24H   feeding supplement  237 mL Oral BID BM   folic acid  1 mg Oral Daily   hydrALAZINE  75 mg Oral TID   magnesium oxide  400 mg Oral Daily   metoprolol succinate  100 mg Oral Daily   pantoprazole  40 mg Oral Daily   rosuvastatin  10 mg Oral Daily   thiamine  100 mg Oral Daily   Continuous Infusions:  Procedures/Studies: DG Chest Port 1 View  Result Date: 11/16/2022 CLINICAL DATA:  Weakness EXAM: PORTABLE CHEST 1 VIEW COMPARISON:  08/06/2017 FINDINGS: Chronic scarring at the right base. No acute airspace disease. Stable cardiomediastinal silhouette with aortic atherosclerosis.vague foci of opacity overlying right second anterior rib end as well as the left mid to lower thorax, question healing rib fractures. IMPRESSION: 1. No active disease. Chronic scarring at the right base. 2. Vague foci of bilateral opacity, question healing rib fractures. Consider correlation with chest CT to exclude lung parenchymal abnormality. Electronically Signed   By: Jasmine Pang M.D.   On: 11/16/2022 17:37   EEG adult  Result Date: 10/31/2022 Charlsie Quest, MD     10/31/2022 11:04 AM Patient Name: Erica Ball MRN: [id number] Epilepsy Attending: Charlsie Quest Referring Physician/Provider: Marvel Plan, MD Date: 10/31/2022 Duration: 22.55 mins Patient history: 72yo F with ams getting eeg to evaluate for seizure. Level of alertness: Awake AEDs during EEG study: None Technical aspects: This EEG study was done with scalp electrodes positioned according to the 10-20 International system of electrode placement. Electrical activity was reviewed with band pass filter of 1-70Hz , sensitivity of 7 uV/mm, display speed of 50mm/sec with a 60Hz  notched filter applied as appropriate. EEG data  were recorded continuously and digitally stored.  Video monitoring was available and reviewed as appropriate. Description: No clear posterior dominant rhythm was seen. EEG also showed continuous generalized and lateralized right hemisphere mixed frequencies with predominantly 6 to 9 Hz theta-alpha activity admixed with intermittent generalized 2 to 3 Hz delta slowing.  Physiologic photic driving was not seen during photic stimulation. Hyperventilation was not performed.   ABNORMALITY - Continuous slow, generalized and lateralized right hemisphere IMPRESSION: This study is suggestive of cortical dysfunction arising from right hemisphere likely secondary to underlying structural abnormality. Additionally there is moderate diffuse encephalopathy. No seizures or epileptiform discharges were seen throughout the recording. Charlsie Quest   ECHOCARDIOGRAM COMPLETE  Result Date: 10/30/2022    ECHOCARDIOGRAM REPORT   Patient Name:   KATERI BALCH Sterbenz Date of Exam: 10/30/2022 Medical Rec #:  [id number]         Height:       68.0 in Accession #:    [id number]        Weight:       140.0 lb Date of Birth:  29-Oct-1950        BSA:          1.756 m Patient Age:    56 years          BP:           150/84 mmHg Patient Gender: F                 HR:           80 bpm. Exam Location:  Inpatient Procedure: 2D Echo, Color Doppler and Cardiac Doppler Indications:    CVA  History:        Patient has prior history of Echocardiogram examinations. COPD;                 Risk Factors:Hypertension and Current Smoker. Sepsis. ETOH                 abuse.  Sonographer:    Aron Baba Referring Phys: [phone] RALPH A NETTEY IMPRESSIONS  1. Left ventricular ejection fraction, by estimation, is 60 to 65%. The left ventricle has normal function. The left ventricle has no regional wall motion  abnormalities. Left ventricular diastolic parameters are consistent with Grade I diastolic dysfunction (impaired relaxation).  2. Right ventricular systolic  function is normal. The right ventricular size is normal.  3. Left atrial size was mildly dilated.  4. The mitral valve is normal in structure. No evidence of mitral valve regurgitation. No evidence of mitral stenosis.  5. The aortic valve is normal in structure. Aortic valve regurgitation is mild to moderate. No aortic stenosis is present.  6. The inferior vena cava is normal in size with greater than 50% respiratory variability, suggesting right atrial pressure of 3 mmHg. FINDINGS  Left Ventricle: Left ventricular ejection fraction, by estimation, is 60 to 65%. The left ventricle has normal function. The left ventricle has no regional wall motion abnormalities. The left ventricular internal cavity size was normal in size. There is  no left ventricular hypertrophy. Left ventricular diastolic parameters are consistent with Grade I diastolic dysfunction (impaired relaxation). Right Ventricle: The right ventricular size is normal. No increase in right ventricular wall thickness. Right ventricular systolic function is normal. Left Atrium: Left atrial size was mildly dilated. Right Atrium: Right atrial size was normal in size. Pericardium: There is no evidence of pericardial effusion. Mitral Valve: The mitral valve is normal in structure. No evidence of mitral valve regurgitation. No evidence of mitral valve stenosis. Tricuspid Valve: The tricuspid valve is normal in structure. Tricuspid valve regurgitation is mild . No evidence of tricuspid stenosis. Aortic Valve: The aortic valve is normal in structure. Aortic valve regurgitation is mild to moderate. Aortic regurgitation PHT measures 677 msec. No aortic stenosis is present. Pulmonic Valve: The pulmonic valve was normal in structure. Pulmonic valve regurgitation is not visualized. No evidence of pulmonic stenosis. Aorta: The aortic root is normal in size and structure. Venous: The inferior vena cava is normal in size with greater than 50% respiratory variability,  suggesting right atrial pressure of 3 mmHg. IAS/Shunts: No atrial level shunt detected by color flow Doppler.  LEFT VENTRICLE PLAX 2D LVIDd:         4.40 cm   Diastology LVIDs:         2.90 cm   LV e' medial:    5.33 cm/s LV PW:         0.60 cm   LV E/e' medial:  13.7 LV IVS:        0.90 cm   LV e' lateral:   8.59 cm/s LVOT diam:     1.90 cm   LV E/e' lateral: 8.5 LV SV:         71 LV SV Index:   41 LVOT Area:     2.84 cm  RIGHT VENTRICLE RV S prime:     15.40 cm/s TAPSE (M-mode): 2.4 cm LEFT ATRIUM             Index        RIGHT ATRIUM           Index LA Vol (A2C):   74.8 ml 42.59 ml/m  RA Area:     12.70 cm LA Vol (A4C):   56.0 ml 31.89 ml/m  RA Volume:   26.00 ml  14.80 ml/m LA Biplane Vol: 70.7 ml 40.26 ml/m  AORTIC VALVE             PULMONIC VALVE LVOT Vmax:   129.00 cm/s PR End Diast Vel: 11.16 msec LVOT Vmean:  83.300 cm/s LVOT VTI:    0.252 m AI PHT:      677 msec  AORTA Ao Root diam: 3.50 cm Ao Asc diam:  3.20 cm MITRAL VALVE                TRICUSPID VALVE MV Area (PHT): 3.77 cm     TR Peak grad:   26.8 mmHg MV Decel Time: 201 msec     TR Vmax:        259.00 cm/s MV E velocity: 73.20 cm/s MV A velocity: 148.00 cm/s  SHUNTS MV E/A ratio:  0.49         Systemic VTI:  0.25 m                             Systemic Diam: 1.90 cm Kardie Tobb DO Electronically signed by Thomasene Ripple DO Signature Date/Time: 10/30/2022/3:20:07 PM    Final    CT ANGIO HEAD NECK W WO CM  Result Date: 10/30/2022 CLINICAL DATA:  Scattered acute and subacute infarcts on 10/29/2022 MRI, determine embolic source EXAM: CT ANGIOGRAPHY HEAD AND NECK WITH AND WITHOUT CONTRAST TECHNIQUE: Multidetector CT imaging of the head and neck was performed using the standard protocol during bolus administration of intravenous contrast. Multiplanar CT image reconstructions and MIPs were obtained to evaluate the vascular anatomy. Carotid stenosis measurements (when applicable) are obtained utilizing NASCET criteria, using the distal internal carotid  diameter as the denominator. RADIATION DOSE REDUCTION: This exam was performed according to the departmental dose-optimization program which includes automated exposure control, adjustment of the mA and/or kV according to patient size and/or use of iterative reconstruction technique. CONTRAST:  75mL OMNIPAQUE IOHEXOL 350 MG/ML SOLN COMPARISON:  No prior CTA available, correlation is made with CT head 10/28/2022 and MRI 10/29/2022 FINDINGS: CT HEAD FINDINGS Paste that Brain: No evidence of acute hemorrhage, mass, mass effect, or midline shift. No hydrocephalus or extra-axial fluid collection. Redemonstrated scattered hypodense foci in the periventricular and subcortical white matter, some of which correlate with the acute and subacute infarcts seen on the 10/29/2022 MRI, although there is also a background of moderate chronic small vessel ischemic disease. Vascular: No hyperdense vessel. Atherosclerotic calcifications in the intracranial carotid and vertebral arteries. Skull: Negative for fracture or focal lesion. Sinuses/Orbits: Mucosal thickening in the ethmoid air cells and right sphenoid sinus. Status post bilateral lens replacements. Other: The mastoid air cells are well aerated. CTA NECK FINDINGS Aortic arch: Standard branching. Imaged portion shows no evidence of aneurysm or dissection. No significant stenosis of the major arch vessel origins. Aortic atherosclerosis. Right carotid system: No evidence of dissection, occlusion, or hemodynamically significant stenosis (greater than 50%). Atherosclerotic disease at the bifurcation and in the proximal ICA is not hemodynamically significant. Left carotid system: No evidence of dissection, occlusion, or hemodynamically significant stenosis (greater than 50%). Atherosclerotic disease at the bifurcation and in the proximal ICA is not hemodynamically significant. Vertebral arteries: Mild stenosis in the proximal right V1. The vertebral arteries are otherwise patent to  the skull base, without significant stenosis, dissection, or occlusion. Skeleton: Negative. Other neck: Emphysema. No focal pulmonary opacity or pleural effusion. Upper chest: No focal pulmonary opacity or pleural effusion. Review of the MIP images confirms the above findings CTA HEAD FINDINGS Anterior circulation: Both internal carotid arteries are patent to the termini, with calcifications but without significant stenosis. A1 segments patent. Normal anterior communicating artery. Anterior cerebral arteries are patent to their distal aspects without significant stenosis. No M1 stenosis or occlusion. MCA branches perfused to their distal aspects without significant stenosis. Posterior  circulation: Vertebral arteries patent to the vertebrobasilar junction, with mild stenosis in the distal right V4, after the PICA takeoff. Left dominant system. Posterior inferior cerebellar arteries patent proximally. Basilar patent to its distal aspect without significant stenosis. Superior cerebellar arteries patent proximally. Patent left P1. Fetal origin of the right PCA from the right posterior communicating artery. PCAs perfused to their distal aspects without significant stenosis. The left posterior communicating artery is also patent. Venous sinuses: Not well opacified due to phase of contrast. Anatomic variants: Fetal origin of the right PCA. Review of the MIP images confirms the above findings IMPRESSION: 1. No intracranial large vessel occlusion. Mild stenosis in the distal right V4. 2. Mild stenosis in the proximal right V1. No other hemodynamically significant stenosis in the neck. 3. Aortic atherosclerosis and emphysema. Aortic Atherosclerosis (ICD10-I70.0) and Emphysema (ICD10-J43.9). Electronically Signed   By: Wiliam Ke M.D.   On: 10/30/2022 11:41   MR BRAIN WO CONTRAST  Result Date: 10/29/2022 CLINICAL DATA:  Mental status change, unknown cause. EXAM: MRI HEAD WITHOUT CONTRAST TECHNIQUE: Multiplanar,  multiecho pulse sequences of the brain and surrounding structures were obtained without intravenous contrast. COMPARISON:  CT head without contrast 10/28/2022 FINDINGS: Brain: Scattered small foci of restricted diffusion present the subcortical white matter bilaterally. The largest lesion is in the left corona radiata to the external capsule where a cylindrical area restricted diffusion is present. The areas in question all demonstrate T2 and FLAIR hyperintensities remote lacunar infarcts are present within the thalami bilaterally. Remote lacunar infarcts are present within the right posterolateral pons extending into the middle cerebellar peduncle. Remote lacunar infarct is present in the left cerebellum. Mild generalized atrophy is present. The ventricles are proportionate to the degree of atrophy. No significant extraaxial fluid collection is present. Scattered foci of susceptibility are present throughout the white matter both above and below the tentorium. Vascular: Flow is present in the major intracranial arteries. Skull and upper cervical spine: The craniocervical junction is normal. Upper cervical spine is within normal limits. Marrow signal is unremarkable. Sinuses/Orbits: A left mastoid effusion is present. No obstructing nasopharyngeal lesion is present. Bilateral lens replacements are noted. Globes and orbits are otherwise unremarkable. IMPRESSION: 1. Scattered small foci of restricted diffusion present the subcortical white matter bilaterally. The largest lesion is in the left corona radiata to the external capsule where a cylindrical area of restricted diffusion is present. Findings are consistent with acute/subacute nonhemorrhagic infarcts. This suggests a central embolic source or small vessel microangiopathy. 2. Remote lacunar infarcts of the right posterolateral pons extending into the middle cerebellar peduncle. 3. Remote lacunar infarcts of the thalami bilaterally. 4. Remote lacunar infarct of  the left cerebellum. 5. Scattered foci of susceptibility throughout the white matter both above and below the tentorium. This is also consistent with microvascular angiopathy, potentially amyloid angiopathy 6. Left mastoid effusion. No obstructing nasopharyngeal lesion is present. Electronically Signed   By: Marin Roberts M.D.   On: 10/29/2022 16:07   CT ANKLE LEFT WO CONTRAST  Result Date: 10/28/2022 CLINICAL DATA:  Ankle trauma, fracture, xray done (Age >= 5y) EXAM: CT OF THE LEFT ANKLE WITHOUT CONTRAST TECHNIQUE: Multidetector CT imaging of the left ankle was performed according to the standard protocol. Multiplanar CT image reconstructions were also generated. RADIATION DOSE REDUCTION: This exam was performed according to the departmental dose-optimization program which includes automated exposure control, adjustment of the mA and/or kV according to patient size and/or use of iterative reconstruction technique. COMPARISON:  X-ray 10/28/2022 FINDINGS: Bones/Joint/Cartilage  Diffuse osseous demineralization. Subacute-appearing healing nondisplaced fracture of the lateral malleolus (series 4, images 37-39). No medial or posterior malleolar fracture. Ankle mortise is congruent without dislocation. Bones of the hindfoot are intact. Joint spaces are relatively well preserved. No erosion. No bone lesion. Ligaments Suboptimally assessed by CT. Muscles and Tendons No acute musculotendinous abnormality by CT. Soft tissues Soft tissue swelling most pronounced at the lateral aspect of the ankle. No organized hematoma. IMPRESSION: 1. Subacute-appearing healing nondisplaced fracture of the lateral malleolus. 2. Soft tissue swelling most pronounced at the lateral aspect of the ankle. Electronically Signed   By: Duanne Guess D.O.   On: 10/28/2022 16:30   CT HEAD WO CONTRAST ( )  Result Date: 10/28/2022 CLINICAL DATA:  Altered level of consciousness EXAM: CT HEAD WITHOUT CONTRAST TECHNIQUE: Contiguous axial  images were obtained from the base of the skull through the vertex without intravenous contrast. RADIATION DOSE REDUCTION: This exam was performed according to the departmental dose-optimization program which includes automated exposure control, adjustment of the mA and/or kV according to patient size and/or use of iterative reconstruction technique. COMPARISON:  03/08/2006 FINDINGS: Brain: Scattered hypodensities are seen throughout the periventricular and subcortical white matter and right-sided pons, consistent with age-indeterminate small vessel ischemic changes. No other signs of acute infarct or hemorrhage. Lateral ventricles and midline structures are unremarkable. No acute extra-axial fluid collections. No mass effect. Vascular: No hyperdense vessel or unexpected calcification. Skull: Normal. Negative for fracture or focal lesion. Sinuses/Orbits: No acute finding. Other: None. IMPRESSION: 1. Age-indeterminate small-vessel ischemic changes throughout the periventricular and subcortical white matter and right-sided pons, favor chronic. 2. No acute hemorrhage. Electronically Signed   By: Sharlet Salina M.D.   On: 10/28/2022 14:38   DG Ankle Complete Left  Result Date: 10/28/2022 CLINICAL DATA:  Fall with ankle swelling EXAM: LEFT ANKLE COMPLETE - 3 VIEW COMPARISON:  None Available. FINDINGS: Apparent cortical discontinuity along the lateral and posterior aspect of the distal fibular diaphysis. Diffuse osteopenia. Diffuse soft tissue edema about the ankle. IMPRESSION: Apparent cortical discontinuity along the lateral and posterior aspect of the distal fibular diaphysis, which may represent a nondisplaced fracture. Electronically Signed   By: Agustin Cree M.D.   On: 10/28/2022 13:01   CT Foot Right Wo Contrast  Result Date: 10/23/2022 CLINICAL DATA:  Fall and right foot pain. EXAM: CT OF THE RIGHT FOOT WITHOUT CONTRAST TECHNIQUE: Multidetector CT imaging of the right foot was performed according to the  standard protocol. Multiplanar CT image reconstructions were also generated. RADIATION DOSE REDUCTION: This exam was performed according to the departmental dose-optimization program which includes automated exposure control, adjustment of the mA and/or kV according to patient size and/or use of iterative reconstruction technique. COMPARISON:  Right foot radiograph dated 10/23/2022. FINDINGS: Bones/Joint/Cartilage Evaluation of the bones is very limited due to advanced osteoporosis. No acute fracture or dislocation. MRI or bone scan may provide better evaluation if there is high clinical concern for acute fracture. Prior internal fixation of the lateral and medial malleoli. Ligaments Suboptimally assessed by CT. Muscles and Tendons No acute findings. Soft tissues There is mild subcutaneous edema of the dorsum of the foot. IMPRESSION: 1. No acute fracture or dislocation. 2. Advanced osteoporosis. Electronically Signed   By: Elgie Collard M.D.   On: 10/23/2022 22:23   CT Knee Left Wo Contrast  Result Date: 10/23/2022 CLINICAL DATA:  Tibial plateau fracture EXAM: CT OF THE LEFT KNEE WITHOUT CONTRAST TECHNIQUE: Multidetector CT imaging of the left knee was performed according  to the standard protocol. Multiplanar CT image reconstructions were also generated. RADIATION DOSE REDUCTION: This exam was performed according to the departmental dose-optimization program which includes automated exposure control, adjustment of the mA and/or kV according to patient size and/or use of iterative reconstruction technique. COMPARISON:  None Available. FINDINGS: Bones/Joint/Cartilage The bones are diffusely osteopenic. There is an acute minimally depressed (3 mm) medial tibial plateau fracture. Acute fracture involves the posterior aspect of the tibial spines. This is nondisplaced. There is minimal medial and lateral compartment joint space narrowing with chondrocalcinosis. Large knee joint effusion is present with fat fluid  levels. Ligaments Suboptimally assessed by CT. Muscles and Tendons Grossly intact, but well evaluated on noncontrast CT. Soft tissues There is mild subcutaneous edema surrounding the knee. Peripheral vascular calcifications are present. IMPRESSION: 1. Acute minimally depressed medial tibial plateau fracture. 2. Acute nondisplaced fracture of the posterior aspect of the tibial spines. 3. Large lipohemarthrosis. Electronically Signed   By: Darliss Cheney M.D.   On: 10/23/2022 22:22   DG Foot Complete Left  Result Date: 10/23/2022 CLINICAL DATA:  Fall with foot pain EXAM: LEFT FOOT - COMPLETE 3+ VIEW COMPARISON:  None Available. FINDINGS: Osteopenia limits fracture assessment. Hallux valgus deformity at the first MTP joint with slight lateral subluxation base of first proximal phalanx. Mild degenerative change at the first MTP joint. Dorsal soft tissue swelling. IMPRESSION: Osteopenia limits fracture assessment. No definite acute osseous abnormality. Dorsal soft tissue swelling. Electronically Signed   By: Jasmine Pang M.D.   On: 10/23/2022 19:05   DG Foot Complete Right  Result Date: 10/23/2022 CLINICAL DATA:  Fall with pain EXAM: RIGHT FOOT COMPLETE - 3+ VIEW COMPARISON:  None Available. FINDINGS: Bones appear osteopenic which limits fracture assessment. Surgical plate and fixating screws in the distal fibula with fixating screws at the distal tibia. No malalignment. Acute appearing fractures involving the necks of the right third and fourth metatarsals. IMPRESSION: Acute appearing fractures involving the necks of the right third and fourth metatarsals. Electronically Signed   By: Jasmine Pang M.D.   On: 10/23/2022 19:02   DG Knee Complete 4 Views Left  Result Date: 10/23/2022 CLINICAL DATA:  Fall with knee pain EXAM: LEFT KNEE - COMPLETE 4+ VIEW COMPARISON:  None Available. FINDINGS: Bones appear osteopenic. No malalignment. Moderate knee effusion. Acute nondisplaced intra-articular fracture involving  the proximal tibia with lucency to the medial tibial plateau. IMPRESSION: 1. Acute nondisplaced intra-articular fracture involving the proximal tibia with lucency extending to the medial tibial plateau. 2. Moderate knee effusion. Electronically Signed   By: Jasmine Pang M.D.   On: 10/23/2022 19:01    Catarina Hartshorn, DO  [facility]  If 7PM-7AM, please contact night-coverage [email or web address] Password [facility] 11/21/2022, 6:14 PM   LOS: 5 days

## 2022-11-21 NOTE — Progress Notes (Signed)
Occupational Therapy Treatment Patient Details Name: Erica Ball MRN: [id number] DOB: Dec 28, 1950 Today's Date: 11/21/2022   History of present illness 72 year old female admitted 7/12 from SNF with hyponatremia, Hypomagnesemia/hypokalemia, and suspected UTI. Recent admission 6/18 after fall, found to have left tibial plateau fracture. PMHx: alcohol abuse, tobacco abuse, HTN, COPD, mobility issues   OT comments  Patient received in supine and agreeable to OT session. Patient demonstrating good gains with min assist to get to EOB and to transfer to recliner with cues for WB precaution. Seated in recliner patient performed grooming and BUE HEP. After completing HEP patient asked to return to bed with min assist for transfer and bed mobility. Patient will benefit from continued inpatient follow up therapy, <3 hours/day to increase independence and safety with functional transfers and self care. Acute OT to continue to follow.    Recommendations for follow up therapy are one component of a multi-disciplinary discharge planning process, led by the attending physician.  Recommendations may be updated based on patient status, additional functional criteria and insurance authorization.    Assistance Recommended at Discharge Frequent or constant Supervision/Assistance  Patient can return home with the following  A lot of help with walking and/or transfers;A lot of help with bathing/dressing/bathroom;Assistance with cooking/housework;Direct supervision/assist for medications management;Direct supervision/assist for financial management;Assist for transportation;Help with stairs or ramp for entrance   Equipment Recommendations  Other (comment) (defer)    Recommendations for Other Services      Precautions / Restrictions Precautions Precautions: Fall Required Braces or Orthoses: Knee Immobilizer - Left Knee Immobilizer - Left: On at all times Restrictions Weight Bearing Restrictions: Yes RLE  Weight Bearing: Weight bearing as tolerated LLE Weight Bearing: Non weight bearing       Mobility Bed Mobility Overal bed mobility: Needs Assistance Bed Mobility: Supine to Sit, Sit to Supine     Supine to sit: Min assist, HOB elevated Sit to supine: Min assist   General bed mobility comments: min assist with LLE to return to supine    Transfers Overall transfer level: Needs assistance Equipment used: Rolling walker (2 wheels) Transfers: Sit to/from Stand, Bed to chair/wheelchair/BSC Sit to Stand: Min assist Stand pivot transfers: Min assist         General transfer comment: frequent cues for WB precautions     Balance Overall balance assessment: Needs assistance, History of Falls Sitting-balance support: No upper extremity supported, Feet supported Sitting balance-Leahy Scale: Fair Sitting balance - Comments: sitting EOB   Standing balance support: During functional activity, Bilateral upper extremity supported, Reliant on assistive device for balance Standing balance-Leahy Scale: Poor Standing balance comment: reliant on RW for support                           ADL either performed or assessed with clinical judgement   ADL Overall ADL's : Needs assistance/impaired     Grooming: Wash/dry hands;Wash/dry face;Applying deodorant;Set up;Sitting                                      Extremity/Trunk Assessment              Vision       Perception     Praxis      Cognition Arousal/Alertness: Awake/alert Behavior During Therapy: WFL for tasks assessed/performed Overall Cognitive Status: Impaired/Different from baseline Area of Impairment: Memory, Following commands, Safety/judgement, Problem  solving                     Memory: Decreased short-term memory Following Commands: Follows one step commands with increased time Safety/Judgement: Decreased awareness of safety   Problem Solving: Difficulty sequencing, Requires  verbal cues General Comments: cues to maintain WB precautions        Exercises Exercises: General Upper Extremity General Exercises - Upper Extremity Shoulder Flexion: Strengthening, Both, 10 reps, Seated, Theraband Theraband Level (Shoulder Flexion): Level 1 (Yellow) Shoulder Horizontal ABduction: Strengthening, Both, 10 reps, Seated, Theraband Theraband Level (Shoulder Horizontal Abduction): Level 1 (Yellow)    Shoulder Instructions       General Comments      Pertinent Vitals/ Pain       Pain Assessment Pain Assessment: Faces Faces Pain Scale: Hurts little more Pain Location: LLE with mobility Pain Descriptors / Indicators: Discomfort, Sore Pain Intervention(s): Limited activity within patient's tolerance, Monitored during session, Repositioned  Home Living                                          Prior Functioning/Environment              Frequency  Min 1X/week        Progress Toward Goals  OT Goals(current goals can now be found in the care plan section)  Progress towards OT goals: Progressing toward goals  Acute Rehab OT Goals Patient Stated Goal: get stronger OT Goal Formulation: With patient Time For Goal Achievement: 12/02/22 Potential to Achieve Goals: Good ADL Goals Pt Will Perform Grooming: sitting;with min guard assist Pt Will Perform Upper Body Dressing: with supervision;sitting Pt Will Perform Lower Body Dressing: with supervision;with adaptive equipment;sitting/lateral leans;sit to/from stand Pt Will Transfer to Toilet: with supervision;squat pivot transfer;stand pivot transfer;bedside commode Pt/caregiver will Perform Home Exercise Program: Both right and left upper extremity;With written HEP provided;Increased strength;Increased ROM;With Supervision Additional ADL Goal #1: pt will perform bed mobility mod I in prep for ADLs  Plan Discharge plan remains appropriate;Frequency remains appropriate    Co-evaluation                  AM-PAC OT "6 Clicks" Daily Activity     Outcome Measure   Help from another person eating meals?: A Little Help from another person taking care of personal grooming?: A Little Help from another person toileting, which includes using toliet, bedpan, or urinal?: A Little Help from another person bathing (including washing, rinsing, drying)?: A Lot Help from another person to put on and taking off regular upper body clothing?: A Little Help from another person to put on and taking off regular lower body clothing?: A Lot 6 Click Score: 16    End of Session Equipment Utilized During Treatment: Rolling walker (2 wheels);Gait belt;Left knee immobilizer  OT Visit Diagnosis: Unsteadiness on feet (R26.81);Other abnormalities of gait and mobility (R26.89);History of falling (Z91.81);Muscle weakness (generalized) (M62.81) Pain - Right/Left: Left Pain - part of body: Knee   Activity Tolerance Patient tolerated treatment well   Patient Left in bed;with call bell/phone within reach;with bed alarm set   Nurse Communication Mobility status        Time: 1610-9604 OT Time Calculation (min): 32 min  Charges: OT General Charges $OT Visit: 1 Visit OT Treatments $Self Care/Home Management : 8-22 mins $Therapeutic Exercise: 8-22 mins  Alfonse Flavors, OTA Acute Rehabilitation Services  Office [phone]   Dewain Penning 11/21/2022, 2:29 PM

## 2022-11-22 ENCOUNTER — Inpatient Hospital Stay (HOSPITAL_COMMUNITY): Payer: Medicare HMO

## 2022-11-22 DIAGNOSIS — E871 Hypo-osmolality and hyponatremia: Secondary | ICD-10-CM | POA: Diagnosis not present

## 2022-11-22 LAB — BASIC METABOLIC PANEL
Anion gap: 10 (ref 5–15)
BUN: 11 mg/dL (ref 8–23)
CO2: 25 mmol/L (ref 22–32)
Calcium: 8.6 mg/dL — ABNORMAL LOW (ref 8.9–10.3)
Chloride: 87 mmol/L — ABNORMAL LOW (ref 98–111)
Creatinine, Ser: 0.49 mg/dL (ref 0.44–1.00)
GFR, Estimated: >60 mL/min (ref >60)
Glucose, Bld: 101 mg/dL — ABNORMAL HIGH (ref 70–99)
Potassium: 4 mmol/L (ref 3.5–5.1)
Sodium: 122 mmol/L — ABNORMAL LOW (ref 135–145)

## 2022-11-22 LAB — MAGNESIUM: Magnesium: 1.5 mg/dL — ABNORMAL LOW (ref 1.7–2.4)

## 2022-11-22 LAB — HEMOGLOBIN AND HEMATOCRIT, BLOOD
HCT: 35.8 % — ABNORMAL LOW (ref 36.0–46.0)
Hemoglobin: 12.3 g/dL (ref 12.0–15.0)

## 2022-11-22 LAB — SODIUM: Sodium: 123 mmol/L — ABNORMAL LOW (ref 135–145)

## 2022-11-22 MED ORDER — THIAMINE MONONITRATE 100 MG PO TABS: 100.0000 mg | ORAL_TABLET | Freq: Every day | ORAL | Status: DC

## 2022-11-22 MED ORDER — THIAMINE HCL 100 MG/ML IJ SOLN
500.0000 mg | Freq: Three times a day (TID) | INTRAVENOUS | Status: AC
  Administered 2022-11-22 – 2022-11-25 (×9): 500 mg via INTRAVENOUS
  Filled 2022-11-22 (×9): qty 5

## 2022-11-22 MED ORDER — UREA 15 G PO PACK
15.0000 g | PACK | Freq: Two times a day (BID) | ORAL | Status: DC
  Administered 2022-11-22 – 2022-11-25 (×8): 15 g via ORAL
  Filled 2022-11-22 (×9): qty 1

## 2022-11-22 MED ORDER — THIAMINE HCL 100 MG/ML IJ SOLN
250.0000 mg | Freq: Every day | INTRAVENOUS | Status: DC
  Administered 2022-11-25 – 2022-11-27 (×3): 250 mg via INTRAVENOUS
  Filled 2022-11-22 (×6): qty 2.5

## 2022-11-22 NOTE — Progress Notes (Addendum)
PROGRESS NOTE    Erica Ball  PPI:[id number] DOB: 1950-07-24 DOA: 11/16/2022 PCP: Erica Salt, PA  Chief Complaint  Patient presents with   Low Na   Confusion    Brief Narrative:   72 year old female with history of alcohol abuse, tobacco abuse, hypertension, COPD who presented from skilled nursing facility with complaint of generalized weakness, confusion compared to baseline.  Lab work at [facility] showed Erica Ball sodium of 120 and was sent to the emergency department.  Sodium of 119 on presentation.  UA was suspicious for UTI.  Patient was started on IV fluids, ceftriaxone.  PT/OT recommending SNF.  Hospital course remarkable for persistent hyponatremia, nephrology consulted due to persistent hyponatremia/drop in Na.    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   -finished 3 days of antibiotics with ceftriaxone   History of alcohol use/tobacco use: Counseled cessation.  Continue thiamine, folic acid.   -Drinks hard liquor every day, smokes half packs Erica Ball day   Hypertension: -metoprolol,hydralazine.   -Dose of hydralazine increased this hospitalization   H/O SVT:  -Continue metoprolol -currently in sinus   Hyperlipidemia: On Crestor   Recent history of left knee fracture: Reviewed last orthopedics note.  She was supposed to be following up with orthopedics.Has fractured patella.She was recommended NWB on left LE, and was supposed to  follow up with Orthopedics Dr Erica Ball.   Therapy messaged ortho regarding weight bearing status (would reach out tomorrow if no note, they've ordered imaging)   Reported Blood in Stool Hb stable, monitor for now, would recommend outpatient follow up unless BRBPR or  melena noted here or drop in Hb/Hct  Generalized weakness/deconditioning: Patient is from SNF.  PT/OT consulted here,recommended SnF.   Apparently has bed at SNF (not yet ready for d/c with unimproved hyponatremia).  Skilled nursing facility had concern regarding her mental health saying that she had suicidal ideations in the past.  Patient strictly denies any suicidal ideation at present per 7/17 note.     DVT prophylaxis: lovenox Code Status: full Family Communication: siblings (brother, sister) at bedside Disposition:   Status is: Inpatient Remains inpatient appropriate because: continued need for inpatient care   Consultants:  Ortho nephro  Procedures:  none  Antimicrobials:  Anti-infectives (From admission, onward)    Start     Dose/Rate Route Frequency Ordered Stop   11/17/22 1000  cefTRIAXone (ROCEPHIN) 2 g in sodium chloride 0.9 % 100 mL IVPB        2 g 200 mL/hr over 30 Minutes Intravenous Every 24 hours 11/16/22 2014 11/19/22 1336   11/16/22 2015  cefTRIAXone (ROCEPHIN) 1 g in sodium chloride 0.9 % 100 mL IVPB        1 g 200 mL/hr over 30 Minutes Intravenous  Once 11/16/22 2001 11/16/22 2043       Subjective: No new complaints Siblings at bedside  Objective: Vitals:   11/21/22 2041 11/22/22 0335 11/22/22 0845 11/22/22 1453  BP: (!) 142/71 137/72 (!) 176/71 (!) 147/72  Pulse: 61 66  63  Resp: 16 20    Temp: 97.8 F (36.6 C) 98.7 F (37.1 C)  98 F (36.7 C)  TempSrc: Oral Oral  Oral  SpO2: 94%   96%  Weight:  Height:        Intake/Output Summary (Last 24 hours) at 11/22/2022 1836 Last data filed at 11/22/2022 0131 Gross per 24 hour  Intake --  Output 400 ml  Net -400 ml   Filed Weights   11/17/22 0100  Weight: 55.7 kg    Examination:  General exam: Appears calm and comfortable  Respiratory system: unlabored Cardiovascular system: RRR Gastrointestinal system: Abdomen is nondistended, soft and nontender.  Central nervous system: Alert  and oriented. No focal neurological deficits. Extremities:  no LEE    Data Reviewed: I have personally reviewed following labs and imaging studies  CBC: Recent Labs  Lab 11/16/22 1846 11/17/22 0409 11/22/22 1706  WBC 8.9 7.7  --   HGB 12.4 11.4* 12.3  HCT 34.7* 32.2* 35.8*  MCV 93.3 93.1  --   PLT 375 320  --     Basic Metabolic Panel: Recent Labs  Lab 11/17/22 0409 11/17/22 1143 11/18/22 0201 11/19/22 0311 11/19/22 2340 11/20/22 2343 11/21/22 0944 11/22/22 0054 11/22/22 1336  NA 124*   < > 126* 126* 124* 124* 122* 122* 123*  K 3.3*   < > 3.7 3.8 4.0 3.9 4.0 4.0  --   CL 92*   < > 93* 95* 94* 93* 90* 87*  --   CO2 23   < > 25 27 24 25 27 25   --   GLUCOSE 88   < > 104* 88 99 96 116* 101*  --   BUN <5*   < > <5* 5* 11 12 9 11   --   CREATININE 0.50   < > 0.50 0.48 0.58 0.49 0.51 0.49  --   CALCIUM 8.5*   < > 8.4* 8.4* 8.4* 8.7* 8.8* 8.6*  --   MG 1.5*  --  1.6* 1.4* 1.8  --   --  1.5*  --   PHOS 3.4  --   --   --   --   --   --   --   --    < > = values in this interval not displayed.    GFR: Estimated Creatinine Clearance: 55.1 mL/min (by C-G formula based on SCr of 0.49 mg/dL).  Liver Function Tests: Recent Labs  Lab 11/16/22 1846  AST 17  ALT 14  ALKPHOS 50  BILITOT 0.6  PROT 6.3*  ALBUMIN 3.5    CBG: No results for input(s): "GLUCAP" in the last 168 hours.   Recent Results (from the past 240 hour(s))  Urine Culture     Status: Abnormal   Collection Time: 11/16/22  8:04 PM   Specimen: Urine, Clean Catch  Result Value Ref Range Status   Specimen Description URINE, CLEAN CATCH  Final   Special Requests   Final    NONE Performed at [facility], [address], [place], [place] [address]    Culture MULTIPLE SPECIES PRESENT, SUGGEST RECOLLECTION (Erica Ball)  Final   Report Status 11/18/2022 FINAL  Final         Radiology Studies: DG Knee Complete 4 Views Left  Result Date: 11/22/2022 CLINICAL DATA:  Left tibial plateau fracture EXAM: LEFT  KNEE - COMPLETE 4+ VIEW COMPARISON:  CT 10/23/2022 and radiographs 10/23/2022 FINDINGS: Compared with radiographs 10/23/2022, there is sclerosis in the medial tibial plateau compatible with healing fracture. Fracture lines extend into the tibial spines and lateral tibial metadiaphysis. Similar depression of the medial tibial plateau. Decreased joint effusion, now small. 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 Demineralization.  No dislocation. IMPRESSION: Mild healing changes about the medial tibial plateau fracture. Fracture lines remain visible. Electronically Signed   By: Minerva Fester M.D.   On: 11/22/2022 17:40        Scheduled Meds:  enoxaparin (LOVENOX) injection  40 mg Subcutaneous Q24H   feeding supplement  237 mL Oral BID BM   folic acid  1 mg Oral Daily   hydrALAZINE  75 mg Oral TID   magnesium oxide  400 mg Oral Daily   metoprolol succinate  100 mg Oral Daily   pantoprazole  40 mg Oral Daily   rosuvastatin  10 mg Oral Daily   [START ON 11/30/2022] thiamine  100 mg Oral Daily   urea  15 g Oral BID   Continuous Infusions:  thiamine (VITAMIN B1) injection 500 mg (11/22/22 1452)   Followed by   Melene Muller ON 11/25/2022] thiamine (VITAMIN B1) injection       LOS: 6 days    Time spent: over 30 min    Lacretia Nicks, MD [facility]   To contact the attending provider between 7A-7P or the covering provider during after hours 7P-7A, please log into the web site [email or web address] and access using universal [place] password for that web site. If you do not have the password, please call the hospital operator.  11/22/2022, 6:36 PM

## 2022-11-22 NOTE — TOC Progression Note (Signed)
Transition of Care [facility]) - Progression Note    Patient Details  Name: Erica Ball MRN: [id number] Date of Birth: 10-01-1950  [facility] CM/SW Contact  Delilah Shan, LCSWA Phone Number: 11/22/2022, 10:17 AM  Clinical Narrative:     CSW called patients insurance to verify if CSW will need to restart auth. Patients insurance informed CSW that patients insurance authorization will be good through 7/21,11:59pm. Patient has SN[facility] bed at [facility] when medically ready. CSW will continue to follow and assist with patients dc planning needs.  Expected Discharge Plan: Skilled Nursing Facility Barriers to Discharge: Continued Medical Work up  Expected Discharge Plan and Services In-house Referral: Clinical Social Work   Post Acute Care Choice:  (pt wants to talk to family about DC home) Living arrangements for the past 2 months: Single Family Home                                       Social Determinants of Health (SDOH) Interventions SDOH Screenings   Food Insecurity: No Food Insecurity (11/17/2022)  Housing: Low Risk  (11/17/2022)  Transportation Needs: No Transportation Needs (11/17/2022)  Utilities: Not At Risk (11/17/2022)  Alcohol Screen: Low Risk  (11/17/2022)  Depression (PHQ2-9): Low Risk  (11/17/2022)  Financial Resource Strain: Patient Unable To Answer (11/17/2022)  Physical Activity: Sufficiently Active (11/17/2022)  Social Connections: Socially Isolated (11/17/2022)  Stress: No Stress Concern Present (11/17/2022)  Tobacco Use: High Risk (11/16/2022)  Health Literacy: Inadequate Health Literacy (11/17/2022)    Readmission Risk Interventions     No data to display

## 2022-11-22 NOTE — Progress Notes (Signed)
Physical Therapy Treatment Patient Details Name: Erica Ball MRN: [id number] DOB: [birth year] Today's Date: 11/22/2022   History of Present Illness 72 year old female admitted 7/12 from SNF with hyponatremia, Hypomagnesemia/hypokalemia, and suspected UTI. Recent admission 6/18 after fall, found to have left tibial plateau fracture. PMHx: alcohol abuse, tobacco abuse, HTN, COPD, mobility issues    PT Comments  Patient resting in bed at start of session and agreeable to therapy visit. Pt requesting use of toilet upon sitting EOB, min guard for bed mobility. Pt required repeated cues with sit<>stand and pivot bed<>BSC to maintain NWB on Lt LE. Manual assist to lift Lt LE off floor provided by therapist and cues to increase WB through bil UE on RW provided. Pt able to take scooting/shuffled steps progressing to hops with Rt LE using RW to move ~5' from [facility] [address] to recliner. EOS pt agreeable to sit up and RN at bedside for medications. Will continue to progress pt as able.    Assistance Recommended at Discharge Frequent or constant Supervision/Assistance  If plan is discharge home, recommend the following:  Can travel by private vehicle    Assist for transportation;Help with stairs or ramp for entrance;A lot of help with walking and/or transfers;A lot of help with bathing/dressing/bathroom;Assistance with cooking/housework;Direct supervision/assist for medications management   No  Equipment Recommendations  None recommended by PT (defer to next venue)    Recommendations for Other Services       Precautions / Restrictions Precautions Precautions: Fall Required Braces or Orthoses: Knee Immobilizer - Left Knee Immobilizer - Left: On at all times Restrictions Weight Bearing Restrictions: Yes RLE Weight Bearing: Weight bearing as tolerated LLE Weight Bearing: Non weight bearing     Mobility  Bed Mobility Overal bed mobility: Needs Assistance Bed Mobility: Supine to Sit     Supine to  sit: HOB elevated, Min guard     General bed mobility comments: min guard with pt using bed features to move supine>sit    Transfers Overall transfer level: Needs assistance Equipment used: Rolling walker (2 wheels) Transfers: Sit to/from Stand, Bed to chair/wheelchair/BSC Sit to Stand: Min assist Stand pivot transfers: Min assist         General transfer comment: frequent cues for WB precautions, pt wtih great difficulty to maintain throughout mobility. pt scooting/sliding Rt foot on floor to attempt to maintain NWB on Lt LE.    Ambulation/Gait Ambulation/Gait assistance: Min assist Gait Distance (Feet): 5 Feet Assistive device: Rolling walker (2 wheels) Gait Pattern/deviations: Step-to pattern, Shuffle Gait velocity: decr     General Gait Details: pt with great difficulty maintaining NWB on Lt LE. repeated cues to push walker down hard through floor to increase UE use to maintain NWB on Lt LE. pt with improved use of UEs on last few steps.   Stairs             Wheelchair Mobility     Tilt Bed    Modified Rankin (Stroke Patients Only)       Balance Overall balance assessment: Needs assistance, History of Falls Sitting-balance support: No upper extremity supported, Feet supported Sitting balance-Leahy Scale: Fair Sitting balance - Comments: sitting EOB   Standing balance support: During functional activity, Bilateral upper extremity supported, Reliant on assistive device for balance Standing balance-Leahy Scale: Poor Standing balance comment: reliant on RW for support                            Cognition  Arousal/Alertness: Awake/alert Behavior During Therapy: WFL for tasks assessed/performed Overall Cognitive Status: Impaired/Different from baseline Area of Impairment: Memory, Following commands, Safety/judgement, Problem solving                     Memory: Decreased short-term memory Following Commands: Follows one step commands  with increased time, Follows multi-step commands inconsistently, Follows multi-step commands with increased time Safety/Judgement: Decreased awareness of safety   Problem Solving: Difficulty sequencing, Requires verbal cues, Slow processing, Decreased initiation General Comments: cues to maintain WB precautions        Exercises      General Comments        Pertinent Vitals/Pain Pain Assessment Pain Assessment: Faces Faces Pain Scale: Hurts a little bit Pain Location: LLE with mobility Pain Descriptors / Indicators: Discomfort, Sore Pain Intervention(s): Limited activity within patient's tolerance, Monitored during session, Repositioned    Home Living                          Prior Function            PT Goals (current goals can now be found in the care plan section) Acute Rehab PT Goals Patient Stated Goal: Back to rehab, regain independence PT Goal Formulation: With patient Time For Goal Achievement: 12/01/22 Potential to Achieve Goals: Good Progress towards PT goals: Progressing toward goals (slow)    Frequency    Min 1X/week      PT Plan Current plan remains appropriate    Co-evaluation              AM-PAC PT "6 Clicks" Mobility   Outcome Measure  Help needed turning from your back to your side while in a flat bed without using bedrails?: A Little Help needed moving from lying on your back to sitting on the side of a flat bed without using bedrails?: A Little Help needed moving to and from a bed to a chair (including a wheelchair)?: A Little Help needed standing up from a chair using your arms (e.g., wheelchair or bedside chair)?: A Little Help needed to walk in hospital room?: A Little Help needed climbing 3-5 steps with a railing? : Total 6 Click Score: 16    End of Session Equipment Utilized During Treatment: Gait belt;Left knee immobilizer Activity Tolerance: Patient tolerated treatment well Patient left: in chair;with call  bell/phone within reach;with chair alarm set;with family/visitor present Nurse Communication: Mobility status PT Visit Diagnosis: Unsteadiness on feet (R26.81);Muscle weakness (generalized) (M62.81);History of falling (Z91.81);Difficulty in walking, not elsewhere classified (R26.2);Pain Pain - Right/Left: Left Pain - part of body: Knee;Leg     Time: 1421-1450 PT Time Calculation (min) (ACUTE ONLY): 29 min  Charges:    $Gait Training: 8-22 mins $Therapeutic Activity: 8-22 mins PT General Charges $$ ACUTE PT VISIT: 1 Visit                     Wynn Maudlin, DPT Acute Rehabilitation Services Office [phone]  11/22/22 4:35 PM

## 2022-11-22 NOTE — Progress Notes (Signed)
Subjective:  Pt seen bedside this AM. She states she is doing well, and has been selectively choosing high protein options.  Objective Vital signs in last 24 hours: Vitals:   11/21/22 2040 11/21/22 2041 11/22/22 0335 11/22/22 0845  BP:  (!) 142/71 137/72 (!) 176/71  Pulse:  61 66   Resp:  16 20   Temp: 97.8 F (36.6 C) 97.8 F (36.6 C) 98.7 F (37.1 C)   TempSrc:  Oral Oral   SpO2:  94%    Weight:      Height:       Weight change:   Intake/Output Summary (Last 24 hours) at 11/22/2022 0934 Last data filed at 11/22/2022 0131 Gross per 24 hour  Intake --  Output 400 ml  Net -400 ml    Assessment/ Plan: Pt is a 72 y.o. yo female who was admitted on 11/16/2022 with  hyponatremia  Assessment/Plan: 1. 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 Sodium continues to be at 122, unchanged from yesterday. She does endorse good PO intake with protein, and is adherent to her ensure. She will benefit with urea therapy now to help increase sodium levels and help get fluid off of her. Will initiate 15g BID, and continue on fluid restriction. Will recheck sodium level around 1600.   Shruti Arrey  PGY-2   Labs: Basic Metabolic Panel: Recent Labs  Lab 11/17/22 0409 11/17/22 1143 11/20/22 2343 11/21/22 0944 11/22/22 0054  NA 124*   < > 124* 122* 122*  K 3.3*   < > 3.9 4.0 4.0  CL 92*   < > 93* 90* 87*  CO2 23   < > 25 27 25   GLUCOSE 88   < > 96 116* 101*  BUN <5*   < > 12 9 11   CREATININE 0.50   < > 0.49 0.51 0.49  CALCIUM 8.5*   < > 8.7* 8.8* 8.6*  PHOS 3.4  --   --   --   --    < > = values in this interval not displayed.   Liver Function Tests: Recent Labs  Lab 11/16/22 1846  AST 17  ALT 14  ALKPHOS 50  BILITOT 0.6  PROT 6.3*  ALBUMIN 3.5   No results for input(s): "LIPASE", "AMYLASE" in the last 168 hours. No results for input(s): "AMMONIA" in the last 168 hours. CBC: Recent Labs  Lab 11/16/22 1846  11/17/22 0409  WBC 8.9 7.7  HGB 12.4 11.4*  HCT 34.7* 32.2*  MCV 93.3 93.1  PLT 375 320   Cardiac Enzymes: No results for input(s): "CKTOTAL", "CKMB", "CKMBINDEX", "TROPONINI" in the last 168 hours. CBG: No results for input(s): "GLUCAP" in the last 168 hours.  Iron Studies: No results for input(s): "IRON", "TIBC", "TRANSFERRIN", "FERRITIN" in the last 72 hours. Studies/Results: No results found. Medications: Infusions:   Scheduled Medications:  enoxaparin (LOVENOX) injection  40 mg Subcutaneous Q24H   feeding supplement  237 mL Oral BID BM   folic acid  1 mg Oral Daily   hydrALAZINE  75 mg Oral TID   magnesium oxide  400 mg Oral Daily   metoprolol succinate  100 mg Oral Daily   pantoprazole  40 mg Oral Daily   rosuvastatin  10 mg Oral Daily   thiamine  100 mg Oral Daily    have reviewed scheduled and prn medications.  Physical Exam: General: Well appearing female, in no acute distress Heart:Normal rate and rhythm, no  murmurs rubs or gallops heard Lungs: Clear to auscultation bilaterally Abdomen: Non-distended, non-tender, normoactive bowel sounds Extremities: No edema noted bilaterally of lower extremities.  Dialysis Access: NA    11/22/2022,9:34 AM  LOS: 6 days

## 2022-11-22 NOTE — Progress Notes (Signed)
Mobility Specialist: Progress Note   11/22/22 1500  Mobility  Activity Ambulated with assistance in room  Level of Assistance Minimal assist, patient does 75% or more (+2)  Assistive Device Front wheel walker  Distance Ambulated (ft) 5 ft  RLE Weight Bearing WBAT  LLE Weight Bearing NWB  Activity Response Tolerated well  Mobility Referral Yes  $Mobility charge 1 Mobility  Mobility Specialist Start Time (ACUTE ONLY) 1530  Mobility Specialist Stop Time (ACUTE ONLY) 1550  Mobility Specialist Time Calculation (min) (ACUTE ONLY) 20 min   Pt was agreeable to mobility session. No c/o throughout. Pt able to maintain LLE NWB with physical and verbal cues. Able to take multiple short hop-steps forward and backward from recliner. Left in recliner with all needs met. Chair alarm on.   Maurene Capes Mobility Specialist Please contact via SecureChat or Rehab office at [phone]

## 2022-11-22 NOTE — Plan of Care (Signed)

## 2022-11-23 DIAGNOSIS — E871 Hypo-osmolality and hyponatremia: Secondary | ICD-10-CM | POA: Diagnosis not present

## 2022-11-23 DIAGNOSIS — E43 Unspecified severe protein-calorie malnutrition: Secondary | ICD-10-CM | POA: Insufficient documentation

## 2022-11-23 LAB — CBC WITH DIFFERENTIAL/PLATELET
Abs Immature Granulocytes: 0.02 10*3/uL (ref 0.00–0.07)
Basophils Absolute: 0.1 10*3/uL (ref 0.0–0.1)
Basophils Relative: 1 %
Eosinophils Absolute: 0.5 10*3/uL (ref 0.0–0.5)
Eosinophils Relative: 6 %
HCT: 35.7 % — ABNORMAL LOW (ref 36.0–46.0)
Hemoglobin: 12.1 g/dL (ref 12.0–15.0)
Immature Granulocytes: 0 %
Lymphocytes Relative: 10 %
Lymphs Abs: 0.7 10*3/uL (ref 0.7–4.0)
MCH: 32.5 pg (ref 26.0–34.0)
MCHC: 33.9 g/dL (ref 30.0–36.0)
MCV: 96 fL (ref 80.0–100.0)
Monocytes Absolute: 0.6 10*3/uL (ref 0.1–1.0)
Monocytes Relative: 8 %
Neutro Abs: 5.4 10*3/uL (ref 1.7–7.7)
Neutrophils Relative %: 75 %
Platelets: 286 10*3/uL (ref 150–400)
RBC: 3.72 MIL/uL — ABNORMAL LOW (ref 3.87–5.11)
RDW: 11.9 % (ref 11.5–15.5)
WBC: 7.2 10*3/uL (ref 4.0–10.5)
nRBC: 0 % (ref 0.0–0.2)

## 2022-11-23 LAB — COMPREHENSIVE METABOLIC PANEL
ALT: 26 U/L (ref 0–44)
AST: 22 U/L (ref 15–41)
Albumin: 3.3 g/dL — ABNORMAL LOW (ref 3.5–5.0)
Alkaline Phosphatase: 45 U/L (ref 38–126)
Anion gap: 7 (ref 5–15)
BUN: 38 mg/dL — ABNORMAL HIGH (ref 8–23)
CO2: 29 mmol/L (ref 22–32)
Calcium: 9 mg/dL (ref 8.9–10.3)
Chloride: 87 mmol/L — ABNORMAL LOW (ref 98–111)
Creatinine, Ser: 0.66 mg/dL (ref 0.44–1.00)
GFR, Estimated: >60 mL/min (ref >60)
Glucose, Bld: 95 mg/dL (ref 70–99)
Potassium: 3.8 mmol/L (ref 3.5–5.1)
Sodium: 123 mmol/L — ABNORMAL LOW (ref 135–145)
Total Bilirubin: 0.2 mg/dL — ABNORMAL LOW (ref 0.3–1.2)
Total Protein: 6.1 g/dL — ABNORMAL LOW (ref 6.5–8.1)

## 2022-11-23 LAB — MAGNESIUM: Magnesium: 1.7 mg/dL (ref 1.7–2.4)

## 2022-11-23 LAB — PHOSPHORUS: Phosphorus: 4.3 mg/dL (ref 2.5–4.6)

## 2022-11-23 MED ORDER — ADULT MULTIVITAMIN W/MINERALS CH
ORAL_TABLET | ORAL | Status: AC
  Filled 2022-11-23: qty 1

## 2022-11-23 MED ORDER — ENSURE ENLIVE PO LIQD
237.0000 mL | Freq: Three times a day (TID) | ORAL | Status: DC
  Administered 2022-11-23 – 2022-11-29 (×11): 237 mL via ORAL

## 2022-11-23 MED ORDER — ADULT MULTIVITAMIN W/MINERALS CH
1.0000 | ORAL_TABLET | Freq: Every day | ORAL | Status: DC
  Administered 2022-11-23 – 2022-11-29 (×7): 1 via ORAL
  Filled 2022-11-23 (×6): qty 1

## 2022-11-23 NOTE — Progress Notes (Addendum)
Subjective:  Pt seen bedside this AM. She states she is doing well, and is currently eating.  Objective Vital signs in last 24 hours: Vitals:   11/22/22 2045 11/22/22 2141 11/23/22 0342 11/23/22 0810  BP: 127/61 131/67 130/66 (!) 156/68  Pulse: 62  62 (!) 57  Resp: 18  16   Temp: 97.6 F (36.4 C)  97.7 F (36.5 C)   TempSrc: Oral  Oral   SpO2: 93%  95%   Weight:      Height:       Weight change:   Intake/Output Summary (Last 24 hours) at 11/23/2022 1026 Last data filed at 11/23/2022 0511 Gross per 24 hour  Intake 50.23 ml  Output 1100 ml  Net -1049.77 ml    Assessment/ Plan: Pt is a 72 y.o. yo female who was admitted on 11/16/2022 with  hyponatremia  Assessment/Plan: 1. 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 Sodium at 123 yesterday night, unable to determine this morning sodiums as labs are currently pending. Will continue urea, and endorse good PO intake with protein, as well as fluid restriction.  Will follow up sodium labs.   Saad Nooruddin  PGY-2   Labs: Basic Metabolic Panel: Recent Labs  Lab 11/17/22 0409 11/17/22 1143 11/20/22 2343 11/21/22 0944 11/22/22 0054 11/22/22 1336  NA 124*   < > 124* 122* 122* 123*  K 3.3*   < > 3.9 4.0 4.0  --   CL 92*   < > 93* 90* 87*  --   CO2 23   < > 25 27 25   --   GLUCOSE 88   < > 96 116* 101*  --   BUN <5*   < > 12 9 11   --   CREATININE 0.50   < > 0.49 0.51 0.49  --   CALCIUM 8.5*   < > 8.7* 8.8* 8.6*  --   PHOS 3.4  --   --   --   --   --    < > = values in this interval not displayed.   Liver Function Tests: Recent Labs  Lab 11/16/22 1846  AST 17  ALT 14  ALKPHOS 50  BILITOT 0.6  PROT 6.3*  ALBUMIN 3.5   No results for input(s): "LIPASE", "AMYLASE" in the last 168 hours. No results for input(s): "AMMONIA" in the last 168 hours. CBC: Recent Labs  Lab 11/16/22 1846 11/17/22 0409 11/22/22 1706  WBC 8.9 7.7  --   HGB 12.4 11.4* 12.3  HCT 34.7*  32.2* 35.8*  MCV 93.3 93.1  --   PLT 375 320  --    Cardiac Enzymes: No results for input(s): "CKTOTAL", "CKMB", "CKMBINDEX", "TROPONINI" in the last 168 hours. CBG: No results for input(s): "GLUCAP" in the last 168 hours.  Iron Studies: No results for input(s): "IRON", "TIBC", "TRANSFERRIN", "FERRITIN" in the last 72 hours. Studies/Results: DG Knee Complete 4 Views Left  Result Date: 11/22/2022 CLINICAL DATA:  Left tibial plateau fracture EXAM: LEFT KNEE - COMPLETE 4+ VIEW COMPARISON:  CT 10/23/2022 and radiographs 10/23/2022 FINDINGS: Compared with radiographs 10/23/2022, there is sclerosis in the medial tibial plateau compatible with healing fracture. Fracture lines extend into the tibial spines and lateral tibial metadiaphysis. Similar depression of the medial tibial plateau. Decreased joint effusion, now small. 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 Demineralization. No dislocation. IMPRESSION: Mild healing changes about the medial tibial plateau fracture. Fracture lines remain visible. Electronically Signed  By: Minerva Fester M.D.   On: 11/22/2022 17:40   Medications: Infusions:  thiamine (VITAMIN B1) injection 500 mg (11/23/22 0524)   Followed by   Melene Muller ON 11/25/2022] thiamine (VITAMIN B1) injection      Scheduled Medications:  enoxaparin (LOVENOX) injection  40 mg Subcutaneous Q24H   feeding supplement  237 mL Oral BID BM   folic acid  1 mg Oral Daily   hydrALAZINE  75 mg Oral TID   magnesium oxide  400 mg Oral Daily   metoprolol succinate  100 mg Oral Daily   pantoprazole  40 mg Oral Daily   rosuvastatin  10 mg Oral Daily   [START ON 11/30/2022] thiamine  100 mg Oral Daily   urea  15 g Oral BID    have reviewed scheduled and prn medications.  Physical Exam: General: Well appearing female, in no acute distress Heart:Normal rate and rhythm, no murmurs rubs or gallops heard Lungs: Clear to auscultation bilaterally Abdomen: Non-distended, non-tender,  normoactive bowel sounds Extremities: No edema noted bilaterally of lower extremities.  Dialysis Access: NA    11/23/2022,10:26 AM  LOS: 7 days    I saw the patient and agree with the above assessment and plan.   SNa not yet resultd this AM.  Cont current plan.  Patient remains neurologically stable.  Arita Miss, MD

## 2022-11-23 NOTE — Progress Notes (Signed)
Nutrition Follow-up  DOCUMENTATION CODES:   Severe malnutrition in context of chronic illness  INTERVENTION:   Liberalize diet to REGULAR, allow salt packets. 1200 mL fluid restriction but limiting free water (water, tea, juice, soda, etc); ok for pt to have ice cream and Ensure and NOT count towards fluid restriction given they are not considered "free water"  Ensure Enlive po TID, each supplement provides 350 kcal and 20 grams of protein.  Continue High Dose Thiamine supplementation and folic acid.   Start MVI with Minerals daily  NUTRITION DIAGNOSIS:   Severe Malnutrition related to chronic illness (COPD in addition to EtOH and tobacco use) as evidenced by energy intake < 75% for > or equal to 1 month, severe muscle depletion, moderate muscle depletion, mild fat depletion.  GOAL:   Patient will meet greater than or equal to 90% of their needs  MONITOR:   PO intake, Supplement acceptance, Labs, Weight trends  REASON FOR ASSESSMENT:   Malnutrition Screening Tool    ASSESSMENT:   72 yo female admitted with generalized weaknesa dn hyponatremia. Pt with daily EtOH use (drinks liquor all day) and tobacco use (smokes half pack a day). Pt with recent L knee fracture-brace in place, non-weight bearing on that leg. Noted pt PMH include HTN, COPD  Pt reports ok appetite; trying to eat as much as she can but pt indicates she wants salt and was told by MD that she could have salt. Pt currently on Heart Healthy diet which does not allow added salt. Given this and pt's poor nutritional status, plan to liberalize diet to Regular. Recorded po intake 50% of one meal, no other intake recorded. Pt also reports she is drinking the Ensure shakes  Pt makes a Bloody Mary (vodka) in AM, as she drinks it she pours more clamato juice into it. Pt reports she drinks more vodka during the day but does not say how much. Pt also drinks Kaluha with Milk at some point during the day; she starts drinking when  she wakes up and then stops at 10pm. Pt also indicates she drinks Gatorade every day. Pt reports she only eats one meal per day and does not snack. Family present and indicating pt used to eat one meal a day but now there are days when she does not eat anything.    Urine indicates euvolemic hyponatremia; free water restriction currently. Noted pt also receiving urea  Pt denies wt loss but noted current wt 55.7 kg; +wt loss per weight encounters.  Plan to request new weight as only 1 weight this admission  Noted plan for SNF at discharge but pt wants to d/c home.   Labs: sodium 123 (L), Creatinine wdl, rise in BUN to 38 Meds: thiamine, mag ox, urea, folic acid  NUTRITION - FOCUSED PHYSICAL EXAM:  Flowsheet Row Most Recent Value  Orbital Region Mild depletion  Upper Arm Region Mild depletion  Thoracic and Lumbar Region Mild depletion  Buccal Region Mild depletion  Temple Region Severe depletion  Clavicle Bone Region Moderate depletion  Clavicle and Acromion Bone Region Moderate depletion  Scapular Bone Region Severe depletion  Dorsal Hand Severe depletion  Patellar Region Severe depletion  [R side only,  L side on brace and unable to assess]  Anterior Thigh Region Severe depletion  [R side only,  L side on brace and unable to assess]  Posterior Calf Region Severe depletion  [R side only,  L side on brace and unable to assess]  Edema (RD Assessment)  None       Diet Order:  HEART HEALTHY with 1200 mL fluid restriction   EDUCATION NEEDS:   Education needs have been addressed  Skin:  Skin Assessment: Skin Integrity Issues: Skin Integrity Issues:: Other (Comment) Other: redness, erythema to buttock and sacrum  Last BM:  7/17  Height:   Ht Readings from Last 1 Encounters:  11/17/22 5\' 8"  (1.727 m)    Weight:   Wt Readings from Last 1 Encounters:  11/23/22 58.5 kg     BMI:  Body mass index is 19.61 kg/m.  Estimated Nutritional Needs:   Kcal:  1650-1850  kcals  Protein:  80-90  Fluid:  Fluid restriction per MD, currently 1.2 L   Romelle Starcher MS, RDN, LDN, CNSC Registered Dietitian 3 Clinical Nutrition RD Pager and On-Call Pager Number Located in [place]

## 2022-11-23 NOTE — Progress Notes (Signed)
PROGRESS NOTE    Erica Ball  WNU:[id number] DOB: 10/18/1950 DOA: 11/16/2022 PCP: Norm Salt, PA   Brief Narrative:  This 72 year old female with history of alcohol abuse, tobacco abuse, hypertension, COPD who presented from skilled nursing facility with complaint of generalized weakness, confusion compared to baseline.  Lab work at [facility] showed a sodium of 120 and was sent to the emergency department.  Sodium of 119 on presentation.  UA was suspicious for UTI.  Patient was started on IV fluids, ceftriaxone.  PT/OT recommending SNF.  Hospital course remarkable for persistent hyponatremia, nephrology consulted due to persistent hyponatremia/drop in Na.    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    -She has finished 3 days of antibiotics with ceftriaxone.   History of alcohol use/tobacco use:  Counseled cessation.  Continue thiamine, folic acid.   -Drinks hard liquor every day, smokes half packs a day.   Hypertension: -Continue metoprolol, hydralazine.   -Dose of hydralazine increased during this hospitalization.   H/O SVT:  -Continue metoprolol. -currently in sinus rhythm.   Hyperlipidemia: On Crestor   Recent history of left knee fracture:  Reviewed last orthopedics note.  She was supposed to be following up with orthopedics. Has fractured patella. She was recommended NWB on left LE, and was supposed to  follow up with Orthopedics Dr Odis Hollingshead.   Therapy messaged ortho regarding weight bearing status (would reach out tomorrow if no note, they've ordered imaging)   Reported Blood in Stool Hb stable, monitor for now, would recommend  outpatient follow up unless BRBPR or melena noted here or drop in Hb/Hct. H/ H stable.   Generalized weakness / deconditioning:  Patient is from SNF.  PT/OT consulted here, recommended SNF.   Apparently has bed at SNF (not yet ready for d/c with unimproved hyponatremia).   Skilled nursing facility had concern regarding her mental health saying that she had suicidal ideations in the past.   Patient strictly denies any suicidal ideation at present per 7/17 note.   DVT prophylaxis: Lovenox Code Status: Full code Family Communication: No family at bed side. Disposition Plan:     Status is: Inpatient Remains inpatient appropriate because:continued need for inpatient care.    Consultants:  Nephrology [facility]  Procedures: None  Antimicrobials: Anti-infectives (From admission, onward)    Start     Dose/Rate Route Frequency Ordered Stop   11/17/22 1000  cefTRIAXone (ROCEPHIN) 2 g in sodium chloride 0.9 % 100 mL IVPB        2 g 200 mL/hr over 30 Minutes Intravenous Every 24 hours 11/16/22 2014 11/19/22 1336   11/16/22 2015  cefTRIAXone (ROCEPHIN) 1 g in sodium chloride 0.9 % 100 mL IVPB        1 g 200 mL/hr over 30 Minutes Intravenous  Once 11/16/22 2001 11/16/22 2043      Subjective: Patient was seen and examined at bed side, overnight events noted,  Serum sodium slowly improving, denies any other concerns.  Objective: Vitals:   11/23/22 0810 11/23/22 1312 11/23/22 1414 11/23/22 1434  BP: (!) 156/68 (!) 135/56    Pulse: (!) 57  66   Resp:   14   Temp:   98.1 F (  36.7 C)   TempSrc:   Oral   SpO2:   93%   Weight:    58.5 kg  Height:        Intake/Output Summary (Last 24 hours) at 11/23/2022 1447 Last data filed at 11/23/2022 0511 Gross per 24 hour  Intake 50.23 ml  Output 1100 ml  Net -1049.77 ml   Filed Weights   11/17/22 0100 11/23/22 1434  Weight: 55.7 kg 58.5 kg    Examination:  General exam: Appears calm and comfortable, NAD,  deconditioned Respiratory system: Clear to auscultation. Respiratory effort normal. RR 13 Cardiovascular system: S1 & S2 heard, RRR. No JVD, murmurs, rubs, gallops or clicks. No pedal edema. Gastrointestinal system: Abdomen is nondistended, soft and nontender. Normal bowel sounds heard. Central nervous system: Alert and oriented x 3. No focal neurological deficits. Extremities: No edema, no cyanosis, no clubbing Skin: No rashes, lesions or ulcers Psychiatry: Judgement and insight appear normal. Mood & affect appropriate.     Data Reviewed: I have personally reviewed following labs and imaging studies  CBC: Recent Labs  Lab 11/16/22 1846 11/17/22 0409 11/22/22 1706 11/23/22 1223  WBC 8.9 7.7  --  7.2  NEUTROABS  --   --   --  5.4  HGB 12.4 11.4* 12.3 12.1  HCT 34.7* 32.2* 35.8* 35.7*  MCV 93.3 93.1  --  96.0  PLT 375 320  --  286   Basic Metabolic Panel: Recent Labs  Lab 11/17/22 0409 11/17/22 1143 11/18/22 0201 11/19/22 0311 11/19/22 2340 11/20/22 2343 11/21/22 0944 11/22/22 0054 11/22/22 1336 11/23/22 1223  NA 124*   < > 126* 126* 124* 124* 122* 122* 123* 123*  K 3.3*   < > 3.7 3.8 4.0 3.9 4.0 4.0  --  3.8  CL 92*   < > 93* 95* 94* 93* 90* 87*  --  87*  CO2 23   < > 25 27 24 25 27 25   --  29  GLUCOSE 88   < > 104* 88 99 96 116* 101*  --  95  BUN <5*   < > <5* 5* 11 12 9 11   --  38*  CREATININE 0.50   < > 0.50 0.48 0.58 0.49 0.51 0.49  --  0.66  CALCIUM 8.5*   < > 8.4* 8.4* 8.4* 8.7* 8.8* 8.6*  --  9.0  MG 1.5*  --  1.6* 1.4* 1.8  --   --  1.5*  --  1.7  PHOS 3.4  --   --   --   --   --   --   --   --  4.3   < > = values in this interval not displayed.   GFR: Estimated Creatinine Clearance: 57.8 mL/min (by C-G formula based on SCr of 0.66 mg/dL). Liver Function Tests: Recent Labs  Lab 11/16/22 1846 11/23/22 1223  AST 17 22  ALT 14 26  ALKPHOS 50 45  BILITOT 0.6 0.2*  PROT 6.3* 6.1*  ALBUMIN 3.5 3.3*   No results for input(s): "LIPASE", "AMYLASE" in  the last 168 hours. No results for input(s): "AMMONIA" in the last 168 hours. Coagulation Profile: No results for input(s): "INR", "PROTIME" in the last 168 hours. Cardiac Enzymes: No results for input(s): "CKTOTAL", "CKMB", "CKMBINDEX", "TROPONINI" in the last 168 hours. BNP (last 3 results) No results for input(s): "PROBNP" in the last 8760 hours. HbA1C: No results for input(s): "HGBA1C" in the last 72 hours. CBG: No results for input(s): "GLUCAP" in  the last 168 hours. Lipid Profile: No results for input(s): "CHOL", "HDL", "LDLCALC", "TRIG", "CHOLHDL", "LDLDIRECT" in the last 72 hours. Thyroid Function Tests: No results for input(s): "TSH", "T4TOTAL", "FREET4", "T3FREE", "THYROIDAB" in the last 72 hours. Anemia Panel: No results for input(s): "VITAMINB12", "FOLATE", "FERRITIN", "TIBC", "IRON", "RETICCTPCT" in the last 72 hours. Sepsis Labs: No results for input(s): "PROCALCITON", "LATICACIDVEN" in the last 168 hours.  Recent Results (from the past 240 hour(s))  Urine Culture     Status: Abnormal   Collection Time: 11/16/22  8:04 PM   Specimen: Urine, Clean Catch  Result Value Ref Range Status   Specimen Description URINE, CLEAN CATCH  Final   Special Requests   Final    NONE Performed at [facility], [address], [place], [place] [address]    Culture MULTIPLE SPECIES PRESENT, SUGGEST RECOLLECTION (A)  Final   Report Status 11/18/2022 FINAL  Final     Radiology Studies: DG Knee Complete 4 Views Left  Result Date: 11/22/2022 CLINICAL DATA:  Left tibial plateau fracture EXAM: LEFT KNEE - COMPLETE 4+ VIEW COMPARISON:  CT 10/23/2022 and radiographs 10/23/2022 FINDINGS: Compared with radiographs 10/23/2022, there is sclerosis in the medial tibial plateau compatible with healing fracture. Fracture lines extend into the tibial spines and lateral tibial metadiaphysis. Similar depression of the medial tibial plateau. Decreased joint effusion, now small. Chondrocalcinosis in  the medial and lateral compartments. Demineralization. No dislocation. IMPRESSION: Mild healing changes about the medial tibial plateau fracture. Fracture lines remain visible. Electronically Signed   By: Minerva Fester M.D.   On: 11/22/2022 17:40    Scheduled Meds:  enoxaparin (LOVENOX) injection  40 mg Subcutaneous Q24H   feeding supplement  237 mL Oral TID BM   folic acid  1 mg Oral Daily   hydrALAZINE  75 mg Oral TID   magnesium oxide  400 mg Oral Daily   metoprolol succinate  100 mg Oral Daily   pantoprazole  40 mg Oral Daily   rosuvastatin  10 mg Oral Daily   [START ON 11/30/2022] thiamine  100 mg Oral Daily   urea  15 g Oral BID   Continuous Infusions:  thiamine (VITAMIN B1) injection 500 mg (11/23/22 1323)   Followed by   Melene Muller ON 11/25/2022] thiamine (VITAMIN B1) injection       LOS: 7 days    Time spent: 50 mins    Willeen Niece, MD [facility]   If 7PM-7AM, please contact night-coverage

## 2022-11-23 NOTE — Plan of Care (Signed)

## 2022-11-23 NOTE — TOC Progression Note (Signed)
Transition of Care [facility]) - Progression Note    Patient Details  Name: Erica Ball MRN: [id number] Date of Birth: 1950/06/23  [facility] CM/SW Contact  Delilah Shan, LCSWA Phone Number: 11/23/2022, 11:58 AM  Clinical Narrative:     Patient has SNF bed at [facility] when medically ready for dc. Insurance authorization approved through 7/21. CSW will continue to follow and assist with patients dc planning needs.  Expected Discharge Plan: Skilled Nursing Facility Barriers to Discharge: Continued Medical Work up  Expected Discharge Plan and Services In-house Referral: Clinical Social Work   Post Acute Care Choice:  (pt wants to talk to family about DC home) Living arrangements for the past 2 months: Single Family Home                                       Social Determinants of Health (SDOH) Interventions SDOH Screenings   Food Insecurity: No Food Insecurity (11/17/2022)  Housing: Low Risk  (11/17/2022)  Transportation Needs: No Transportation Needs (11/17/2022)  Utilities: Not At Risk (11/17/2022)  Alcohol Screen: Low Risk  (11/17/2022)  Depression (PHQ2-9): Low Risk  (11/17/2022)  Financial Resource Strain: Patient Unable To Answer (11/17/2022)  Physical Activity: Sufficiently Active (11/17/2022)  Social Connections: Socially Isolated (11/17/2022)  Stress: No Stress Concern Present (11/17/2022)  Tobacco Use: High Risk (11/16/2022)  Health Literacy: Inadequate Health Literacy (11/17/2022)    Readmission Risk Interventions     No data to display

## 2022-11-24 DIAGNOSIS — E871 Hypo-osmolality and hyponatremia: Secondary | ICD-10-CM | POA: Diagnosis not present

## 2022-11-24 LAB — BASIC METABOLIC PANEL
Anion gap: 8 (ref 5–15)
BUN: 28 mg/dL — ABNORMAL HIGH (ref 8–23)
CO2: 30 mmol/L (ref 22–32)
Calcium: 9.3 mg/dL (ref 8.9–10.3)
Chloride: 88 mmol/L — ABNORMAL LOW (ref 98–111)
Creatinine, Ser: 0.6 mg/dL (ref 0.44–1.00)
GFR, Estimated: >60 mL/min (ref >60)
Glucose, Bld: 120 mg/dL — ABNORMAL HIGH (ref 70–99)
Potassium: 4.2 mmol/L (ref 3.5–5.1)
Sodium: 126 mmol/L — ABNORMAL LOW (ref 135–145)

## 2022-11-24 NOTE — Plan of Care (Signed)

## 2022-11-24 NOTE — Progress Notes (Signed)
PROGRESS NOTE    Erica Ball  ZOX:[id number] DOB: [birth year] DOA: 11/16/2022 PCP: Norm Salt, PA   Brief Narrative:  This 72 year old female with history of alcohol abuse, tobacco abuse, hypertension, COPD who presented from skilled nursing facility with complaint of generalized weakness, confusion compared to baseline.  Lab work at [facility] showed a sodium of 120 and was sent to the emergency department.  Sodium of 119 on presentation.  UA was suspicious for UTI.  Patient was started on IV fluids, ceftriaxone.  PT/OT recommending SNF.  Hospital course remarkable for persistent hyponatremia, nephrology consulted due to persistent hyponatremia/drop in Na.    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 -Serum sodium improved to 124.  Nephrology : anticipated discharge if sodium around 130   Hypomagnesemia/ Hypokalemia:  -Supplemented and corrected   Suspected UTI:  -Complains of some dysuria, urine culture showed multiple cyst.    -She has finished 3 days of antibiotics with ceftriaxone.   History of alcohol use/tobacco use:  Counseled cessation.  Continue thiamine, folic acid.   -Drinks hard liquor every day, smokes half packs a day.   Hypertension: -Continue metoprolol, hydralazine.   -Dose of hydralazine increased during this hospitalization.   H/O SVT:  -Continue metoprolol. -currently in sinus rhythm.   Hyperlipidemia: On Crestor   Recent history of left knee fracture:  Reviewed last orthopedics note.  She was supposed to be following up with orthopedics. Has fractured patella. She was recommended NWB on left LE, and was supposed to  follow up with Orthopedics Dr Odis Hollingshead.   Therapy messaged ortho regarding weight bearing status (would reach out tomorrow if no note, they've ordered  imaging)   Reported Blood in Stool Hb stable, monitor for now, would recommend outpatient follow up unless BRBPR or melena noted here or drop in Hb/Hct. H/ H stable.   Generalized weakness / deconditioning:  Patient is from SNF.  PT/OT consulted here, recommended SNF.   Apparently has bed at SNF (not yet ready for d/c with unimproved hyponatremia).   Skilled nursing facility had concern regarding her mental health saying that she had suicidal ideations in the past.   Patient strictly denies any suicidal ideation at present per 7/17 note.   DVT prophylaxis: Lovenox Code Status: Full code Family Communication: No family at bed side. Disposition Plan:     Status is: Inpatient Remains inpatient appropriate because:continued need for inpatient care.    Consultants:  Nephrology [facility]  Procedures: None  Antimicrobials: Anti-infectives (From admission, onward)    Start     Dose/Rate Route Frequency Ordered Stop   11/17/22 1000  cefTRIAXone (ROCEPHIN) 2 g in sodium chloride 0.9 % 100 mL IVPB        2 g 200 mL/hr over 30 Minutes Intravenous Every 24 hours 11/16/22 2014 11/19/22 1336   11/16/22 2015  cefTRIAXone (ROCEPHIN) 1 g in sodium chloride 0.9 % 100 mL IVPB        1 g 200 mL/hr over 30 Minutes Intravenous  Once 11/16/22 2001 11/16/22 2043      Subjective: Patient was seen and examined at bed side, overnight events noted,  Serum sodium slowly improving.  She denies any other concerns.  Objective: Vitals:   11/23/22 1434 11/23/22 1955 11/24/22 0330 11/24/22 0853  BP:  (!) 140/71 (!) 153/83 114/69  Pulse:  63  60   Resp:  18    Temp:  98.4 F (36.9 C) 97.8 F (36.6 C)   TempSrc:  Oral Oral   SpO2:  96% 97% 97%  Weight: 58.5 kg     Height:        Intake/Output Summary (Last 24 hours) at 11/24/2022 1436 Last data filed at 11/24/2022 1100 Gross per 24 hour  Intake 720 ml  Output --  Net 720 ml   Filed Weights   11/17/22 0100 11/23/22 1434  Weight: 55.7 kg  58.5 kg    Examination:  General exam: Appears comfortable, not in any acute distress, deconditioned. Respiratory system: Clear to auscultation. Respiratory effort normal. RR 15 Cardiovascular system: S1 & S2 heard, RRR. No JVD, murmurs, rubs, gallops or clicks. No pedal edema. Gastrointestinal system: Abdomen is nondistended, soft and nontender. Normal bowel sounds heard. Central nervous system: Alert and oriented x 3. No focal neurological deficits. Extremities: No edema, no cyanosis, no clubbing Skin: No rashes, lesions or ulcers Psychiatry: Judgement and insight appear normal. Mood & affect appropriate.     Data Reviewed: I have personally reviewed following labs and imaging studies  CBC: Recent Labs  Lab 11/22/22 1706 11/23/22 1223  WBC  --  7.2  NEUTROABS  --  5.4  HGB 12.3 12.1  HCT 35.8* 35.7*  MCV  --  96.0  PLT  --  286   Basic Metabolic Panel: Recent Labs  Lab 11/18/22 0201 11/19/22 0311 11/19/22 2340 11/20/22 2343 11/21/22 0944 11/22/22 0054 11/22/22 1336 11/23/22 1223 11/24/22 0844  NA 126* 126* 124* 124* 122* 122* 123* 123* 126*  K 3.7 3.8 4.0 3.9 4.0 4.0  --  3.8 4.2  CL 93* 95* 94* 93* 90* 87*  --  87* 88*  CO2 25 27 24 25 27 25   --  29 30  GLUCOSE 104* 88 99 96 116* 101*  --  95 120*  BUN <5* 5* 11 12 9 11   --  38* 28*  CREATININE 0.50 0.48 0.58 0.49 0.51 0.49  --  0.66 0.60  CALCIUM 8.4* 8.4* 8.4* 8.7* 8.8* 8.6*  --  9.0 9.3  MG 1.6* 1.4* 1.8  --   --  1.5*  --  1.7  --   PHOS  --   --   --   --   --   --   --  4.3  --    GFR: Estimated Creatinine Clearance: 57.8 mL/min (by C-G formula based on SCr of 0.6 mg/dL). Liver Function Tests: Recent Labs  Lab 11/23/22 1223  AST 22  ALT 26  ALKPHOS 45  BILITOT 0.2*  PROT 6.1*  ALBUMIN 3.3*   No results for input(s): "LIPASE", "AMYLASE" in the last 168 hours. No results for input(s): "AMMONIA" in the last 168 hours. Coagulation Profile: No results for input(s): "INR", "PROTIME" in the last  168 hours. Cardiac Enzymes: No results for input(s): "CKTOTAL", "CKMB", "CKMBINDEX", "TROPONINI" in the last 168 hours. BNP (last 3 results) No results for input(s): "PROBNP" in the last 8760 hours. HbA1C: No results for input(s): "HGBA1C" in the last 72 hours. CBG: No results for input(s): "GLUCAP" in the last 168 hours. Lipid Profile: No results for input(s): "CHOL", "HDL", "LDLCALC", "TRIG", "CHOLHDL", "LDLDIRECT" in the last 72 hours. Thyroid Function Tests: No results for input(s): "TSH", "T4TOTAL", "FREET4", "T3FREE", "THYROIDAB" in the last 72 hours. Anemia Panel: No results for input(s): "VITAMINB12", "FOLATE", "FERRITIN", "TIBC", "IRON", "RETICCTPCT" in the last 72 hours. Sepsis Labs:  No results for input(s): "PROCALCITON", "LATICACIDVEN" in the last 168 hours.  Recent Results (from the past 240 hour(s))  Urine Culture     Status: Abnormal   Collection Time: 11/16/22  8:04 PM   Specimen: Urine, Clean Catch  Result Value Ref Range Status   Specimen Description URINE, CLEAN CATCH  Final   Special Requests   Final    NONE Performed at [facility], [address], [place], [place] [address]    Culture MULTIPLE SPECIES PRESENT, SUGGEST RECOLLECTION (A)  Final   Report Status 11/18/2022 FINAL  Final     Radiology Studies: DG Knee Complete 4 Views Left  Result Date: 11/22/2022 CLINICAL DATA:  Left tibial plateau fracture EXAM: LEFT KNEE - COMPLETE 4+ VIEW COMPARISON:  CT 10/23/2022 and radiographs 10/23/2022 FINDINGS: Compared with radiographs 10/23/2022, there is sclerosis in the medial tibial plateau compatible with healing fracture. Fracture lines extend into the tibial spines and lateral tibial metadiaphysis. Similar depression of the medial tibial plateau. Decreased joint effusion, now small. 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 Demineralization. No dislocation. IMPRESSION: Mild healing changes about the medial tibial plateau fracture. Fracture  lines remain visible. Electronically Signed   By: Minerva Fester M.D.   On: 11/22/2022 17:40    Scheduled Meds:  enoxaparin (LOVENOX) injection  40 mg Subcutaneous Q24H   feeding supplement  237 mL Oral TID BM   folic acid  1 mg Oral Daily   hydrALAZINE  75 mg Oral TID   magnesium oxide  400 mg Oral Daily   metoprolol succinate  100 mg Oral Daily   multivitamin with minerals  1 tablet Oral Daily   pantoprazole  40 mg Oral Daily   rosuvastatin  10 mg Oral Daily   [START ON 11/30/2022] thiamine  100 mg Oral Daily   urea  15 g Oral BID   Continuous Infusions:  thiamine (VITAMIN B1) injection 500 mg (11/24/22 1356)   Followed by   Melene Muller ON 11/25/2022] thiamine (VITAMIN B1) injection       LOS: 8 days    Time spent: 35 mins    Willeen Niece, MD [facility]   If 7PM-7AM, please contact night-coverage

## 2022-11-24 NOTE — Progress Notes (Signed)
Admit: 11/16/2022 LOS: 8  49F Hyponatremia probably mixture of low solute --> SIADH  Subjective:  SNa up to 126 Taking urea PO  1.1L UOP yesterday, Intake not being reported  No intake/output data recorded.  Filed Weights   11/17/22 0100 11/23/22 1434  Weight: 55.7 kg 58.5 kg    Scheduled Meds:  enoxaparin (LOVENOX) injection  40 mg Subcutaneous Q24H   feeding supplement  237 mL Oral TID BM   folic acid  1 mg Oral Daily   hydrALAZINE  75 mg Oral TID   magnesium oxide  400 mg Oral Daily   metoprolol succinate  100 mg Oral Daily   multivitamin with minerals  1 tablet Oral Daily   pantoprazole  40 mg Oral Daily   rosuvastatin  10 mg Oral Daily   [START ON 11/30/2022] thiamine  100 mg Oral Daily   urea  15 g Oral BID   Continuous Infusions:  thiamine (VITAMIN B1) injection 500 mg (11/24/22 1610)   Followed by   Melene Muller ON 11/25/2022] thiamine (VITAMIN B1) injection     PRN Meds:.acetaminophen, albuterol, labetalol, melatonin, polyethylene glycol, prochlorperazine  Current Labs: reviewed   Latest Reference Range & Units 11/20/22 02:46  Osmolality, Urine 300 - 900 mOsm/kg 470  Sodium, Urine mmol/L 45    Physical Exam:  Blood pressure 114/69, pulse 60, temperature 97.8 F (36.6 C), temperature source Oral, resp. rate 18, height 5\' 8"  (1.727 m), weight 58.5 kg, SpO2 97%. Chrnically ill appearing RRR CTAB No LEE Nonfocal, AAO x3,  A Hyponatremia now most c/ow SIADH  P Cont urea, 1.2L fluid restriction, push protein Finally improving, ok for DC once near/more than 130 Daily labs Will follow  Sabra Heck MD 11/24/2022, 11:01 AM  Recent Labs  Lab 11/22/22 0054 11/22/22 1336 11/23/22 1223 11/24/22 0844  NA 122* 123* 123* 126*  K 4.0  --  3.8 4.2  CL 87*  --  87* 88*  CO2 25  --  29 30  GLUCOSE 101*  --  95 120*  BUN 11  --  38* 28*  CREATININE 0.49  --  0.66 0.60  CALCIUM 8.6*  --  9.0 9.3  PHOS  --   --  4.3  --    Recent Labs  Lab 11/22/22 1706  11/23/22 1223  WBC  --  7.2  NEUTROABS  --  5.4  HGB 12.3 12.1  HCT 35.8* 35.7*  MCV  --  96.0  PLT  --  286

## 2022-11-25 DIAGNOSIS — E871 Hypo-osmolality and hyponatremia: Secondary | ICD-10-CM | POA: Diagnosis not present

## 2022-11-25 LAB — MAGNESIUM: Magnesium: 1.9 mg/dL (ref 1.7–2.4)

## 2022-11-25 LAB — BASIC METABOLIC PANEL
Anion gap: 9 (ref 5–15)
BUN: 27 mg/dL — ABNORMAL HIGH (ref 8–23)
CO2: 30 mmol/L (ref 22–32)
Calcium: 9.3 mg/dL (ref 8.9–10.3)
Chloride: 88 mmol/L — ABNORMAL LOW (ref 98–111)
Creatinine, Ser: 0.56 mg/dL (ref 0.44–1.00)
GFR, Estimated: >60 mL/min (ref >60)
Glucose, Bld: 102 mg/dL — ABNORMAL HIGH (ref 70–99)
Potassium: 4 mmol/L (ref 3.5–5.1)
Sodium: 127 mmol/L — ABNORMAL LOW (ref 135–145)

## 2022-11-25 LAB — CBC
HCT: 32.8 % — ABNORMAL LOW (ref 36.0–46.0)
Hemoglobin: 11.4 g/dL — ABNORMAL LOW (ref 12.0–15.0)
MCH: 33.8 pg (ref 26.0–34.0)
MCHC: 34.8 g/dL (ref 30.0–36.0)
MCV: 97.3 fL (ref 80.0–100.0)
Platelets: 241 10*3/uL (ref 150–400)
RBC: 3.37 MIL/uL — ABNORMAL LOW (ref 3.87–5.11)
RDW: 11.7 % (ref 11.5–15.5)
WBC: 8 10*3/uL (ref 4.0–10.5)
nRBC: 0 % (ref 0.0–0.2)

## 2022-11-25 LAB — PHOSPHORUS: Phosphorus: 3.9 mg/dL (ref 2.5–4.6)

## 2022-11-25 MED ORDER — PNEUMOCOCCAL 20-VAL CONJ VACC 0.5 ML IM SUSY
0.5000 mL | PREFILLED_SYRINGE | INTRAMUSCULAR | Status: DC
  Filled 2022-11-25: qty 0.5

## 2022-11-25 NOTE — Plan of Care (Signed)
  Problem: Clinical Measurements: Goal: Ability to maintain clinical measurements within normal limits will improve Outcome: Progressing Goal: Will remain free from infection Outcome: Progressing Goal: Diagnostic test results will improve Outcome: Progressing Goal: Respiratory complications will improve Outcome: Progressing Goal: Cardiovascular complication will be avoided Outcome: Progressing   Problem: Nutrition: Goal: Adequate nutrition will be maintained Outcome: Progressing   Problem: Safety: Goal: Ability to remain free from injury will improve Outcome: Progressing   Problem: Skin Integrity: Goal: Risk for impaired skin integrity will decrease Outcome: Progressing

## 2022-11-25 NOTE — Progress Notes (Signed)
PROGRESS NOTE    Erica Ball  WGN:[id number] DOB: Oct 08, 1950 DOA: 11/16/2022 PCP: Norm Salt, PA   Brief Narrative:  This 72 year old female with history of alcohol abuse, tobacco abuse, hypertension, COPD who presented from skilled nursing facility with complaint of generalized weakness, confusion compared to baseline.  Lab work at [facility] showed a sodium of 120 and was sent to the emergency department.  Sodium of 119 on presentation.  UA was suspicious for UTI.  Patient was started on IV fluids, ceftriaxone.  PT/OT recommending SNF.  Hospital course remarkable for persistent hyponatremia, nephrology consulted due to persistent hyponatremia/drop in Na.    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 -Serum sodium improved to 124 >127.  Nephrology : anticipated discharge if sodium around 130   Hypomagnesemia/ Hypokalemia:  -Supplemented and corrected   Suspected UTI:  -Complains of some dysuria, urine culture showed multiple cyst.    -She has finished 3 days of antibiotics with ceftriaxone.   History of alcohol use/tobacco use:  Counseled cessation.  Continue thiamine, folic acid.   -Drinks hard liquor every day, smokes half packs a day.   Hypertension: -Continue metoprolol, hydralazine.   -Dose of hydralazine increased during this hospitalization.   H/O SVT:  -Continue metoprolol. -currently in sinus rhythm.   Hyperlipidemia: On Crestor   Recent history of left knee fracture:  Reviewed last orthopedics note.  She was supposed to be following up with orthopedics. Has fractured patella. She was recommended NWB on left LE, and was supposed to  follow up with Orthopedics Dr Odis Hollingshead.   Therapy messaged ortho regarding weight bearing status (would reach out tomorrow if no note, they've  ordered imaging)   Reported Blood in Stool Hb stable, monitor for now, would recommend outpatient follow up unless BRBPR or melena noted here or drop in Hb/Hct. H/ H stable.   Generalized weakness / deconditioning:  Patient is from SNF.  PT/OT consulted here, recommended SNF.   Apparently has bed at SNF (not yet ready for d/c with unimproved hyponatremia).   Skilled nursing facility had concern regarding her mental health saying that she had suicidal ideations in the past.   Patient strictly denies any suicidal ideation at present per 7/17 note.   DVT prophylaxis: Lovenox Code Status: Full code Family Communication: No family at bed side. Disposition Plan:     Status is: Inpatient Remains inpatient appropriate because:continued need for inpatient care.    Consultants:  Nephrology [facility]  Procedures: None  Antimicrobials: Anti-infectives (From admission, onward)    Start     Dose/Rate Route Frequency Ordered Stop   11/17/22 1000  cefTRIAXone (ROCEPHIN) 2 g in sodium chloride 0.9 % 100 mL IVPB        2 g 200 mL/hr over 30 Minutes Intravenous Every 24 hours 11/16/22 2014 11/19/22 1336   11/16/22 2015  cefTRIAXone (ROCEPHIN) 1 g in sodium chloride 0.9 % 100 mL IVPB        1 g 200 mL/hr over 30 Minutes Intravenous  Once 11/16/22 2001 11/16/22 2043      Subjective: Patient was seen and examined at bed side, overnight events noted,  Serum sodium improving.  Patient denies any other concerns.  Objective: Vitals:   11/24/22 2248 11/24/22 2249 11/25/22 0300 11/25/22 0852  BP: (!) 160/78 (!) 160/78 (!) 141/65 (!) 150/68  Pulse:  64  60 63  Resp: 20  18 14   Temp: 97.7 F (36.5 C)  97.8 F (36.6 C) (!) 97.5 F (36.4 C)  TempSrc: Oral  Oral Oral  SpO2: 94%   95%  Weight:      Height:        Intake/Output Summary (Last 24 hours) at 11/25/2022 1443 Last data filed at 11/25/2022 2952 Gross per 24 hour  Intake 979.77 ml  Output --  Net 979.77 ml   Filed Weights    11/17/22 0100 11/23/22 1434  Weight: 55.7 kg 58.5 kg    Examination:  General exam: Appears comfortable, not in any acute distress, deconditioned. Respiratory system: Clear to auscultation. Respiratory effort normal. RR 15 Cardiovascular system: S1 & S2 heard, regular rate and rhythm, no murmur. Gastrointestinal system: Abdomen is nondistended, soft and nontender. Normal bowel sounds heard. Central nervous system: Alert and oriented x 3. No focal neurological deficits. Extremities: No edema, no cyanosis, no clubbing Skin: No rashes, lesions or ulcers Psychiatry: Judgement and insight appear normal. Mood & affect appropriate.     Data Reviewed: I have personally reviewed following labs and imaging studies  CBC: Recent Labs  Lab 11/22/22 1706 11/23/22 1223 11/25/22 0644  WBC  --  7.2 8.0  NEUTROABS  --  5.4  --   HGB 12.3 12.1 11.4*  HCT 35.8* 35.7* 32.8*  MCV  --  96.0 97.3  PLT  --  286 241   Basic Metabolic Panel: Recent Labs  Lab 11/19/22 0311 11/19/22 2340 11/20/22 2343 11/21/22 0944 11/22/22 0054 11/22/22 1336 11/23/22 1223 11/24/22 0844 11/25/22 0644  NA 126* 124*   < > 122* 122* 123* 123* 126* 127*  K 3.8 4.0   < > 4.0 4.0  --  3.8 4.2 4.0  CL 95* 94*   < > 90* 87*  --  87* 88* 88*  CO2 27 24   < > 27 25  --  29 30 30   GLUCOSE 88 99   < > 116* 101*  --  95 120* 102*  BUN 5* 11   < > 9 11  --  38* 28* 27*  CREATININE 0.48 0.58   < > 0.51 0.49  --  0.66 0.60 0.56  CALCIUM 8.4* 8.4*   < > 8.8* 8.6*  --  9.0 9.3 9.3  MG 1.4* 1.8  --   --  1.5*  --  1.7  --  1.9  PHOS  --   --   --   --   --   --  4.3  --  3.9   < > = values in this interval not displayed.   GFR: Estimated Creatinine Clearance: 57.8 mL/min (by C-G formula based on SCr of 0.56 mg/dL). Liver Function Tests: Recent Labs  Lab 11/23/22 1223  AST 22  ALT 26  ALKPHOS 45  BILITOT 0.2*  PROT 6.1*  ALBUMIN 3.3*   No results for input(s): "LIPASE", "AMYLASE" in the last 168 hours. No  results for input(s): "AMMONIA" in the last 168 hours. Coagulation Profile: No results for input(s): "INR", "PROTIME" in the last 168 hours. Cardiac Enzymes: No results for input(s): "CKTOTAL", "CKMB", "CKMBINDEX", "TROPONINI" in the last 168 hours. BNP (last 3 results) No results for input(s): "PROBNP" in the last 8760 hours. HbA1C: No results for input(s): "HGBA1C" in the last 72 hours. CBG: No results for input(s): "GLUCAP" in the last 168 hours. Lipid Profile: No results for input(s): "CHOL", "HDL", "LDLCALC", "TRIG", "  CHOLHDL", "LDLDIRECT" in the last 72 hours. Thyroid Function Tests: No results for input(s): "TSH", "T4TOTAL", "FREET4", "T3FREE", "THYROIDAB" in the last 72 hours. Anemia Panel: No results for input(s): "VITAMINB12", "FOLATE", "FERRITIN", "TIBC", "IRON", "RETICCTPCT" in the last 72 hours. Sepsis Labs: No results for input(s): "PROCALCITON", "LATICACIDVEN" in the last 168 hours.  Recent Results (from the past 240 hour(s))  Urine Culture     Status: Abnormal   Collection Time: 11/16/22  8:04 PM   Specimen: Urine, Clean Catch  Result Value Ref Range Status   Specimen Description URINE, CLEAN CATCH  Final   Special Requests   Final    NONE Performed at [facility], [address] [place], [place] [address]    Culture MULTIPLE SPECIES PRESENT, SUGGEST RECOLLECTION (A)  Final   Report Status 11/18/2022 FINAL  Final     Radiology Studies: No results found.  Scheduled Meds:  enoxaparin (LOVENOX) injection  40 mg Subcutaneous Q24H   feeding supplement  237 mL Oral TID BM   folic acid  1 mg Oral Daily   hydrALAZINE  75 mg Oral TID   magnesium oxide  400 mg Oral Daily   metoprolol succinate  100 mg Oral Daily   multivitamin with minerals  1 tablet Oral Daily   pantoprazole  40 mg Oral Daily   [START ON 11/26/2022] pneumococcal 20-valent conjugate vaccine  0.5 mL Intramuscular Tomorrow-1000   rosuvastatin  10 mg Oral Daily   [START ON 11/30/2022] thiamine   100 mg Oral Daily   urea  15 g Oral BID   Continuous Infusions:  thiamine (VITAMIN B1) injection       LOS: 9 days    Time spent: 35 mins    Willeen Niece, MD [facility]   If 7PM-7AM, please contact night-coverage

## 2022-11-25 NOTE — Progress Notes (Signed)
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 low solute --> SIADH  Subjective:  SNa up to 127 Confused this AM UOP not reported  07/20 0701 - 07/21 0700 In: 2059.8 [P.O.:1710; IV Piggyback:349.8] Out: -   Filed Weights   11/17/22 0100 11/23/22 1434  Weight: 55.7 kg 58.5 kg    Scheduled Meds:  enoxaparin (LOVENOX) injection  40 mg Subcutaneous Q24H   feeding supplement  237 mL Oral TID BM   folic acid  1 mg Oral Daily   hydrALAZINE  75 mg Oral TID   magnesium oxide  400 mg Oral Daily   metoprolol succinate  100 mg Oral Daily   multivitamin with minerals  1 tablet Oral Daily   pantoprazole  40 mg Oral Daily   [START ON 11/26/2022] pneumococcal 20-valent conjugate vaccine  0.5 mL Intramuscular Tomorrow-1000   rosuvastatin  10 mg Oral Daily   [START ON 11/30/2022] thiamine  100 mg Oral Daily   urea  15 g Oral BID   Continuous Infusions:  thiamine (VITAMIN B1) injection     PRN Meds:.acetaminophen, albuterol, labetalol, melatonin, polyethylene glycol, prochlorperazine  Current Labs: reviewed   Latest Reference Range & Units 11/20/22 02:46  Osmolality, Urine 300 - 900 mOsm/kg 470  Sodium, Urine mmol/L 45    Physical Exam:  Blood pressure (!) 150/68, pulse 63, temperature (!) 97.5 F (36.4 C), temperature source Oral, resp. rate 14, height 5\' 8"  (1.727 m), weight 58.5 kg, SpO2 95%. Chrnically ill appearing, confused this AM RRR CTAB No LEE Nonfocal, AAO x3,  A Hyponatremia now most c/w SIADH; prev had low solute features. AMS, not related to #1, likely hospital delirium, per [facility]  P Cont urea, 1.2L fluid restriction, push protein Finally improving, ok for DC once near/more than 130 Daily labs Will follow  Erica Heck MD 11/25/2022, 10:26 AM  Recent Labs  Lab 11/23/22 1223 11/24/22 0844 11/25/22 0644  NA 123* 126* 127*  K 3.8 4.2 4.0  CL 87* 88* 88*  CO2 29 30 30   GLUCOSE 95 120* 102*  BUN 38* 28* 27*  CREATININE 0.66 0.60 0.56   CALCIUM 9.0 9.3 9.3  PHOS 4.3  --  3.9   Recent Labs  Lab 11/22/22 1706 11/23/22 1223 11/25/22 0644  WBC  --  7.2 8.0  NEUTROABS  --  5.4  --   HGB 12.3 12.1 11.4*  HCT 35.8* 35.7* 32.8*  MCV  --  96.0 97.3  PLT  --  286 241

## 2022-11-25 NOTE — Progress Notes (Signed)
Mobility Specialist Progress Note:   11/25/22 1505  Mobility  Activity Transferred to/from [facility]  Level of Assistance Minimal assist, patient does 75% or more  Assistive Device Front wheel walker  Distance Ambulated (ft) 5 ft  RLE Weight Bearing WBAT  LLE Weight Bearing NWB  Activity Response Tolerated fair  Mobility Referral Yes  $Mobility charge 1 Mobility  Mobility Specialist Start Time (ACUTE ONLY) 1505  Mobility Specialist Stop Time (ACUTE ONLY) 1520  Mobility Specialist Time Calculation (min) (ACUTE ONLY) 15 min   Pt eager for mobility session. Requested to transfer to [facility] to void. Required heavy minA to stand from low BSC, and max cues to maintain NWB on LLE. Pt with great difficulty hopping this session, was inching foot across floor to move. Pt with low exercise tolerance, left sitting EOB with all needs met.   Addison Lank Mobility Specialist Please contact via SecureChat or  Rehab office at [phone]

## 2022-11-26 ENCOUNTER — Inpatient Hospital Stay (HOSPITAL_COMMUNITY): Payer: Medicare HMO

## 2022-11-26 DIAGNOSIS — E871 Hypo-osmolality and hyponatremia: Secondary | ICD-10-CM | POA: Diagnosis not present

## 2022-11-26 LAB — BASIC METABOLIC PANEL WITH GFR
Anion gap: 11 (ref 5–15)
Anion gap: 9 (ref 5–15)
BUN: 26 mg/dL — ABNORMAL HIGH (ref 8–23)
BUN: 55 mg/dL — ABNORMAL HIGH (ref 8–23)
CO2: 27 mmol/L (ref 22–32)
CO2: 27 mmol/L (ref 22–32)
Calcium: 9.1 mg/dL (ref 8.9–10.3)
Calcium: 9.1 mg/dL (ref 8.9–10.3)
Chloride: 86 mmol/L — ABNORMAL LOW (ref 98–111)
Chloride: 88 mmol/L — ABNORMAL LOW (ref 98–111)
Creatinine, Ser: 0.66 mg/dL (ref 0.44–1.00)
Creatinine, Ser: 0.64 mg/dL (ref 0.44–1.00)
GFR, Estimated: >60 mL/min
GFR, Estimated: >60 mL/min
Glucose, Bld: 102 mg/dL — ABNORMAL HIGH (ref 70–99)
Glucose, Bld: 121 mg/dL — ABNORMAL HIGH (ref 70–99)
Potassium: 3.8 mmol/L (ref 3.5–5.1)
Potassium: 4.3 mmol/L (ref 3.5–5.1)
Sodium: 124 mmol/L — ABNORMAL LOW (ref 135–145)
Sodium: 124 mmol/L — ABNORMAL LOW (ref 135–145)

## 2022-11-26 LAB — OSMOLALITY, URINE: Osmolality, Ur: 487 mOsm/kg (ref 300–900)

## 2022-11-26 LAB — CBC
HCT: 32.8 % — ABNORMAL LOW (ref 36.0–46.0)
Hemoglobin: 11.2 g/dL — ABNORMAL LOW (ref 12.0–15.0)
MCH: 32.8 pg (ref 26.0–34.0)
MCHC: 34.1 g/dL (ref 30.0–36.0)
MCV: 96.2 fL (ref 80.0–100.0)
Platelets: 274 10*3/uL (ref 150–400)
RBC: 3.41 MIL/uL — ABNORMAL LOW (ref 3.87–5.11)
RDW: 11.8 % (ref 11.5–15.5)
WBC: 7.6 10*3/uL (ref 4.0–10.5)
nRBC: 0 % (ref 0.0–0.2)

## 2022-11-26 LAB — MAGNESIUM: Magnesium: 1.9 mg/dL (ref 1.7–2.4)

## 2022-11-26 LAB — SODIUM, URINE, RANDOM: Sodium, Ur: 42 mmol/L

## 2022-11-26 LAB — PHOSPHORUS: Phosphorus: 4.4 mg/dL (ref 2.5–4.6)

## 2022-11-26 LAB — GLUCOSE, CAPILLARY: Glucose-Capillary: 127 mg/dL — ABNORMAL HIGH (ref 70–99)

## 2022-11-26 MED ORDER — FUROSEMIDE 10 MG/ML IJ SOLN
40.0000 mg | Freq: Three times a day (TID) | INTRAMUSCULAR | Status: AC
  Administered 2022-11-26: 40 mg via INTRAVENOUS
  Filled 2022-11-26: qty 4

## 2022-11-26 MED ORDER — UREA 15 G PO PACK
30.0000 g | PACK | Freq: Two times a day (BID) | ORAL | Status: DC
  Administered 2022-11-26 – 2022-11-27 (×4): 30 g via ORAL
  Filled 2022-11-26 (×5): qty 2

## 2022-11-26 MED ORDER — HYDRALAZINE HCL 50 MG PO TABS
75.0000 mg | ORAL_TABLET | Freq: Four times a day (QID) | ORAL | Status: DC
  Administered 2022-11-26 – 2022-11-27 (×3): 75 mg via ORAL
  Filled 2022-11-26 (×3): qty 1

## 2022-11-26 NOTE — Progress Notes (Signed)
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  Filed Weights   11/17/22 0100 11/23/22 1434  Weight: 55.7 kg 58.5 kg    Scheduled Meds:  enoxaparin (LOVENOX) injection  40 mg Subcutaneous Q24H   feeding supplement  237 mL Oral TID BM   folic acid  1 mg Oral Daily   furosemide  40 mg Intravenous Q8H   hydrALAZINE  75 mg Oral TID   magnesium oxide  400 mg Oral Daily   metoprolol succinate  100 mg Oral Daily   multivitamin with minerals  1 tablet Oral Daily   pantoprazole  40 mg Oral Daily   pneumococcal 20-valent conjugate vaccine  0.5 mL Intramuscular Tomorrow-1000   rosuvastatin  10 mg Oral Daily   [START ON 11/30/2022] thiamine  100 mg Oral Daily   urea  30 g Oral BID   Continuous Infusions:  thiamine (VITAMIN B1) injection 250 mg (11/26/22 0930)   PRN Meds:.acetaminophen, albuterol, labetalol, melatonin, polyethylene glycol, prochlorperazine  Current Labs: reviewed   Latest Reference Range & Units 11/20/22 02:46  Osmolality, Urine 300 - 900 mOsm/kg 470  Sodium, Urine mmol/L 45    Physical Exam:  Blood pressure (!) 174/80, pulse 64, temperature 97.7 F (36.5 C), temperature source Oral, resp. rate 16, height 5\' 8"  (1.727 m), weight 58.5 kg, SpO2 93%. Chrnically ill appearing, lying in bed in nad RRR, no rub CTAB, no iwob No LEE, warm and well perfused Nonfocal, mild confusion  A Hyponatremia now most c/w SIADH; prev had low solute features. Na fluctuating AMS, not related to #1, likely hospital delirium, per [facility]  P Repeat urine studies Increase urea to 30g bid Start IV lasix 40mg  BID and decrease to oral soon Repeat BMP this afternoon Goal Na of 130 Daily labs Will follow  [name]  11/26/2022, 12:18 PM  Recent Labs  Lab 11/23/22 1223 11/24/22 0844 11/25/22 0644 11/26/22 0407  NA 123*  126* 127* 124*  K 3.8 4.2 4.0 3.8  CL 87* 88* 88* 86*  CO2 29 30 30 27   GLUCOSE 95 120* 102* 102*  BUN 38* 28* 27* 26*  CREATININE 0.66 0.60 0.56 0.66  CALCIUM 9.0 9.3 9.3 9.1  PHOS 4.3  --  3.9 4.4   Recent Labs  Lab 11/23/22 1223 11/25/22 0644 11/26/22 0407  WBC 7.2 8.0 7.6  NEUTROABS 5.4  --   --   HGB 12.1 11.4* 11.2*  HCT 35.7* 32.8* 32.8*  MCV 96.0 97.3 96.2  PLT 286 241 274

## 2022-11-26 NOTE — Plan of Care (Signed)

## 2022-11-26 NOTE — Progress Notes (Signed)
Physical Therapy Treatment Patient Details Name: Erica Ball MRN: [id number] DOB: 1951-03-05 Today's Date: 11/26/2022   History of Present Illness 72 year old female admitted 7/12 from SNF with hyponatremia, Hypomagnesemia/hypokalemia, and suspected UTI. Recent admission 6/18 after fall, found to have left tibial plateau fracture. PMHx: alcohol abuse, tobacco abuse, HTN, COPD, mobility issues    PT Comments  Pt seen for PT tx with pt agreeable. Pt performs LLE strengthening exercises prior to OOB mobility. Pt is easily fatigued by mobility (bed>recliner, short distance gait), requiring seated rest break to recover. Pt with poor ability to maintain NWB LLE, at one point even weight bearing through LLE when stepping with RLE. Pt would benefit from ongoing PT services to address endurance/activity tolerance, balance, transfers, & gait.     Assistance Recommended at Discharge Frequent or constant Supervision/Assistance  If plan is discharge home, recommend the following:  Can travel by private vehicle    Assist for transportation;Help with stairs or ramp for entrance;A lot of help with walking and/or transfers;A lot of help with bathing/dressing/bathroom;Assistance with cooking/housework;Direct supervision/assist for medications management   No  Equipment Recommendations  None recommended by PT (defer to next venue)    Recommendations for Other Services       Precautions / Restrictions Precautions Precautions: Fall Required Braces or Orthoses: Knee Immobilizer - Left Knee Immobilizer - Left: On at all times Restrictions Weight Bearing Restrictions: Yes RLE Weight Bearing: Weight bearing as tolerated LLE Weight Bearing: Non weight bearing     Mobility  Bed Mobility Overal bed mobility: Needs Assistance Bed Mobility: Supine to Sit     Supine to sit: Supervision, HOB elevated          Transfers Overall transfer level: Needs assistance Equipment used: Rolling walker  (2 wheels) Transfers: Sit to/from Stand, Bed to chair/wheelchair/BSC Sit to Stand: Min assist, Mod assist Stand pivot transfers: Min assist         General transfer comment: Pt transfers STS from elevated EOB with min assist, from low recliner with mod assist & extra time to power up. Pt transfers bed>recliner on R with RW & min assist with PT attempting to hold pt's LLE off of floor in various methods as pt unable to do so herself, even placing LLE on floor x 1 time & weight bearing through LLE when stepping with RLE. PT educates pt on pushing down through BUE on RW to "hop" to recliner with RLE but pt only scoots RLE multiple times. Pt requires extra time, cuing for sequencing & technique for bed>recliner.    Ambulation/Gait Ambulation/Gait assistance: Min assist, Mod assist Gait Distance (Feet): 3 Feet Assistive device: Rolling walker (2 wheels) Gait Pattern/deviations: Step-to pattern, Decreased step length - right Gait velocity: significantly decreased     General Gait Details: Pt requires encouragement to attempt ambulation. PT is able to take ~3 steps forwards with RLE with PT assisting with holding LLE off of floor but pt with poor ability to maintain NWB LLE. PT is able to clear R foot for 3 "hops" forward before fatigue & pt has to return to sitting.   Stairs             Wheelchair Mobility     Tilt Bed    Modified Rankin (Stroke Patients Only)       Balance Overall balance assessment: Needs assistance, History of Falls Sitting-balance support: No upper extremity supported, Feet supported Sitting balance-Leahy Scale: Fair Sitting balance - Comments: sitting EOB   Standing balance support:  During functional activity, Bilateral upper extremity supported, Reliant on assistive device for balance Standing balance-Leahy Scale: Poor                              Cognition Arousal/Alertness: Awake/alert Behavior During Therapy: WFL for tasks  assessed/performed, Impulsive Overall Cognitive Status: Impaired/Different from baseline Area of Impairment: Memory, Following commands, Safety/judgement, Problem solving, Awareness                       Following Commands: Follows one step commands with increased time Safety/Judgement: Decreased awareness of safety Awareness: Anticipatory   General Comments: Impulsive with STS, transferring to standing without PT being ready. Pt with poor awareness & ability to maintain NWB LLE despite ongoing education/cuing/assistance.        Exercises General Exercises - Lower Extremity Hip ABduction/ADduction: AROM, Supine, Strengthening, Left, 15 reps (15 hip abduction + 15 hip adduction pillow squeezes) Straight Leg Raises: Supine, AROM, Strengthening, Left, 15 reps    General Comments General comments (skin integrity, edema, etc.): Pt received in bed with KI distal to L knee - PT educated pt on proper positioning of KI & adjusted it.      Pertinent Vitals/Pain Pain Assessment Pain Assessment: No/denies pain    Home Living                          Prior Function            PT Goals (current goals can now be found in the care plan section) Acute Rehab PT Goals Patient Stated Goal: Back to rehab, regain independence PT Goal Formulation: With patient Time For Goal Achievement: 12/01/22 Potential to Achieve Goals: Fair Progress towards PT goals: Progressing toward goals    Frequency    Min 1X/week      PT Plan Current plan remains appropriate    Co-evaluation              AM-PAC PT "6 Clicks" Mobility   Outcome Measure  Help needed turning from your back to your side while in a flat bed without using bedrails?: None Help needed moving from lying on your back to sitting on the side of a flat bed without using bedrails?: A Little Help needed moving to and from a bed to a chair (including a wheelchair)?: A Little Help needed standing up from a chair  using your arms (e.g., wheelchair or bedside chair)?: A Lot Help needed to walk in hospital room?: A Lot Help needed climbing 3-5 steps with a railing? : Total 6 Click Score: 15    End of Session Equipment Utilized During Treatment: Gait belt;Left knee immobilizer Activity Tolerance: Patient limited by fatigue Patient left: in chair;with call bell/phone within reach;with chair alarm set Nurse Communication: Mobility status PT Visit Diagnosis: Unsteadiness on feet (R26.81);Muscle weakness (generalized) (M62.81);History of falling (Z91.81);Difficulty in walking, not elsewhere classified (R26.2)     Time: 4782-9562 PT Time Calculation (min) (ACUTE ONLY): 23 min  Charges:    $Gait Training: 8-22 mins $Therapeutic Activity: 8-22 mins PT General Charges $$ ACUTE PT VISIT: 1 Visit                     Aleda Grana, PT, DPT 11/26/22, 2:40 PM   Sandi Mariscal 11/26/2022, 2:39 PM

## 2022-11-26 NOTE — Progress Notes (Signed)
Erica NOTE    LAPORSCHE HOEGER  AOZ:[id number] DOB: 1950/10/17 DOA: 11/16/2022 PCP: Norm Salt, Erica   Brief Narrative:  This 72 year old female with history of alcohol Ball, Erica Ball, Erica Ball, Erica Ball, Erica Ball sent to the emergency department.  Sodium of 119 on presentation.  UA Ball suspicious for UTI.  Patient Ball started on IV fluids, ceftriaxone.  PT/OT recommending SNF.  Hospital course remarkable for persistent hyponatremia, nephrology consulted due to persistent hyponatremia/drop in Na.    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 -Serum sodium improved to 124 >127> 124.  Nephrology : anticipated discharge if sodium around 130. -Nephrology  increased urea to 30 mg bid. Sodium fluctuating.   Hypomagnesemia/ Hypokalemia:  -Supplemented and corrected   Suspected UTI:  -Complains of some dysuria, urine culture showed multiple cyst.    -She has finished 3 days of antibiotics with ceftriaxone.   History of alcohol use/Erica use:  Counseled cessation.  Continue thiamine, folic acid.   -Drinks hard liquor every day, smokes half packs a day.   Erica Ball: -Continue metoprolol, hydralazine.   -Dose of hydralazine increased during this hospitalization.   H/O SVT:  -Continue metoprolol. -Currently in sinus rhythm.   Hyperlipidemia: On Crestor   Recent history of left knee fracture:  Reviewed last orthopedics note.  She Ball supposed to be following up with orthopedics. Has fractured patella. She Ball recommended NWB on left LE, and Ball supposed to  follow up with Orthopedics Dr Odis Hollingshead.   Therapy messaged ortho regarding  weight bearing status (would reach out tomorrow if no note, they've ordered imaging)   Reported Blood in Stool. Hb stable, monitor for now, would recommend outpatient follow up unless BRBPR or melena noted here or drop in Hb/Hct. H/ H stable.   Generalized Ball / deconditioning:  Patient is from SNF.  PT/OT consulted here, recommended SNF.   Apparently has bed at SNF (not yet ready for d/c with unimproved hyponatremia).   Skilled nursing facility had concern regarding her mental health saying that she had suicidal ideations in the past.   Patient strictly denies any suicidal ideation at present per 7/17 note.   DVT prophylaxis: Lovenox Code Status: Full code Family Communication: No family at bed side. Disposition Plan:     Status is: Inpatient Remains inpatient appropriate because:continued need for inpatient care.    Consultants:  Nephrology [facility]  Procedures: None  Antimicrobials: Anti-infectives (From admission, onward)    Start     Dose/Rate Route Frequency Ordered Stop   11/17/22 1000  cefTRIAXone (ROCEPHIN) 2 g in sodium chloride 0.9 % 100 mL IVPB        2 g 200 mL/hr over 30 Minutes Intravenous Every 24 hours 11/16/22 2014 11/19/22 1336   11/16/22 2015  cefTRIAXone (ROCEPHIN) 1 g in sodium chloride 0.9 % 100 mL IVPB        1 g 200 mL/hr over 30 Minutes Intravenous  Once 11/16/22 2001 11/16/22 2043      Subjective: Patient Ball seen and examined at bed side, overnight events noted,  Serum sodium fluctuating.  Sodium down to 124 again.  Patient denies any other concerns.  Objective: Vitals:   11/25/22 1507 11/25/22  2032 11/26/22 0345 11/26/22 0829  BP: (!) 152/72 (!) 183/86 (!) 147/80 (!) 174/80  Pulse: 65 68 65 64  Resp: 12 16 16 16   Temp: 98.1 F (36.7 C) 97.9 F (36.6 C) 98 F (36.7 C) 97.7 F (36.5 C)  TempSrc: Oral Oral Oral Oral  SpO2: 95% 92% 90% 93%  Weight:      Height:        Intake/Output Summary (Last 24 hours) at 11/26/2022  1332 Last data filed at 11/26/2022 1301 Gross per 24 hour  Intake --  Output 300 ml  Net -300 ml   Filed Weights   11/17/22 0100 11/23/22 1434  Weight: 55.7 kg 58.5 kg    Examination:  General exam: Appears comfortable, not in acute distress, deconditioned. Respiratory system: Clear to auscultation. Respiratory effort normal. RR 14 Cardiovascular system: S1 & S2 heard, regular rate and rhythm, no murmur. Gastrointestinal system: Abdomen is soft, non tender, non distended, normal bowel sounds heard. Central nervous system: Alert and oriented x 2. No focal neurological deficits. Extremities: No edema, no cyanosis, no clubbing Skin: No rashes, lesions or ulcers Psychiatry: Judgement and insight appear normal. Mood & affect appropriate.     Data Reviewed: I have personally reviewed following labs and imaging studies  CBC: Recent Labs  Lab 11/22/22 1706 11/23/22 1223 11/25/22 0644 11/26/22 0407  WBC  --  7.2 8.0 7.6  NEUTROABS  --  5.4  --   --   HGB 12.3 12.1 11.4* 11.2*  HCT 35.8* 35.7* 32.8* 32.8*  MCV  --  96.0 97.3 96.2  PLT  --  286 241 274   Basic Metabolic Panel: Recent Labs  Lab 11/19/22 2340 11/20/22 2343 11/22/22 0054 11/22/22 1336 11/23/22 1223 11/24/22 0844 11/25/22 0644 11/26/22 0407  NA 124*   < > 122* 123* 123* 126* 127* 124*  K 4.0   < > 4.0  --  3.8 4.2 4.0 3.8  CL 94*   < > 87*  --  87* 88* 88* 86*  CO2 24   < > 25  --  29 30 30 27   GLUCOSE 99   < > 101*  --  95 120* 102* 102*  BUN 11   < > 11  --  38* 28* 27* 26*  CREATININE 0.58   < > 0.49  --  0.66 0.60 0.56 0.66  CALCIUM 8.4*   < > 8.6*  --  9.0 9.3 9.3 9.1  MG 1.8  --  1.5*  --  1.7  --  1.9 1.9  PHOS  --   --   --   --  4.3  --  3.9 4.4   < > = values in this interval not displayed.   GFR: Estimated Creatinine Clearance: 57.8 mL/min (by C-G formula based on SCr of 0.66 mg/dL). Liver Function Tests: Recent Labs  Lab 11/23/22 1223  AST 22  ALT 26  ALKPHOS 45  BILITOT 0.2*   PROT 6.1*  ALBUMIN 3.3*   No results for input(s): "LIPASE", "AMYLASE" in the last 168 hours. No results for input(s): "AMMONIA" in the last 168 hours. Coagulation Profile: No results for input(s): "INR", "PROTIME" in the last 168 hours. Cardiac Enzymes: No results for input(s): "CKTOTAL", "CKMB", "CKMBINDEX", "TROPONINI" in the last 168 hours. BNP (last 3 results) No results for input(s): "PROBNP" in the last 8760 hours. HbA1C: No results for input(s): "HGBA1C" in the last 72 hours. CBG: No results for input(s): "GLUCAP" in the last 168  hours. Lipid Profile: No results for input(s): "CHOL", "HDL", "LDLCALC", "TRIG", "CHOLHDL", "LDLDIRECT" in the last 72 hours. Thyroid Function Tests: No results for input(s): "TSH", "T4TOTAL", "FREET4", "T3FREE", "THYROIDAB" in the last 72 hours. Anemia Panel: No results for input(s): "VITAMINB12", "FOLATE", "FERRITIN", "TIBC", "IRON", "RETICCTPCT" in the last 72 hours. Sepsis Labs: No results for input(s): "PROCALCITON", "LATICACIDVEN" in the last 168 hours.  Recent Results (from the past 240 hour(s))  Urine Culture     Status: Abnormal   Collection Time: 11/16/22  8:04 PM   Specimen: Urine, Clean Catch  Result Value Ref Range Status   Specimen Description URINE, CLEAN CATCH  Final   Special Requests   Final    NONE Performed at [facility], [address], [place], [place] [address]    Culture MULTIPLE SPECIES PRESENT, SUGGEST RECOLLECTION (A)  Final   Report Status 11/18/2022 FINAL  Final     Radiology Studies: No results found.  Scheduled Meds:  enoxaparin (LOVENOX) injection  40 mg Subcutaneous Q24H   feeding supplement  237 mL Oral TID BM   folic acid  1 mg Oral Daily   furosemide  40 mg Intravenous Q8H   hydrALAZINE  75 mg Oral Q6H   magnesium oxide  400 mg Oral Daily   metoprolol succinate  100 mg Oral Daily   multivitamin with minerals  1 tablet Oral Daily   pantoprazole  40 mg Oral Daily   pneumococcal 20-valent  conjugate vaccine  0.5 mL Intramuscular Tomorrow-1000   rosuvastatin  10 mg Oral Daily   [START ON 11/30/2022] thiamine  100 mg Oral Daily   urea  30 g Oral BID   Continuous Infusions:  thiamine (VITAMIN B1) injection 250 mg (11/26/22 0930)     LOS: 10 days    Time spent: 35 mins    Willeen Niece, MD [facility]   If 7PM-7AM, please contact night-coverage

## 2022-11-26 NOTE — Plan of Care (Signed)

## 2022-11-27 DIAGNOSIS — E871 Hypo-osmolality and hyponatremia: Secondary | ICD-10-CM | POA: Diagnosis not present

## 2022-11-27 LAB — CBC
HCT: 31.6 % — ABNORMAL LOW (ref 36.0–46.0)
Hemoglobin: 10.8 g/dL — ABNORMAL LOW (ref 12.0–15.0)
MCH: 33.5 pg (ref 26.0–34.0)
MCHC: 34.2 g/dL (ref 30.0–36.0)
MCV: 98.1 fL (ref 80.0–100.0)
Platelets: 270 10*3/uL (ref 150–400)
RBC: 3.22 MIL/uL — ABNORMAL LOW (ref 3.87–5.11)
RDW: 11.9 % (ref 11.5–15.5)
WBC: 9.9 10*3/uL (ref 4.0–10.5)
nRBC: 0 % (ref 0.0–0.2)

## 2022-11-27 LAB — BASIC METABOLIC PANEL
Anion gap: 10 (ref 5–15)
Anion gap: 8 (ref 5–15)
BUN: 109 mg/dL — ABNORMAL HIGH (ref 8–23)
BUN: 118 mg/dL — ABNORMAL HIGH (ref 8–23)
CO2: 29 mmol/L (ref 22–32)
CO2: 34 mmol/L — ABNORMAL HIGH (ref 22–32)
Calcium: 9.1 mg/dL (ref 8.9–10.3)
Calcium: 9.5 mg/dL (ref 8.9–10.3)
Chloride: 88 mmol/L — ABNORMAL LOW (ref 98–111)
Chloride: 89 mmol/L — ABNORMAL LOW (ref 98–111)
Creatinine, Ser: 0.8 mg/dL (ref 0.44–1.00)
Creatinine, Ser: 0.87 mg/dL (ref 0.44–1.00)
GFR, Estimated: >60 mL/min (ref >60)
GFR, Estimated: >60 mL/min (ref >60)
Glucose, Bld: 115 mg/dL — ABNORMAL HIGH (ref 70–99)
Glucose, Bld: 114 mg/dL — ABNORMAL HIGH (ref 70–99)
Potassium: 3.8 mmol/L (ref 3.5–5.1)
Potassium: 4 mmol/L (ref 3.5–5.1)
Sodium: 127 mmol/L — ABNORMAL LOW (ref 135–145)
Sodium: 131 mmol/L — ABNORMAL LOW (ref 135–145)

## 2022-11-27 MED ORDER — FUROSEMIDE 40 MG PO TABS
40.0000 mg | ORAL_TABLET | Freq: Every day | ORAL | Status: DC
  Administered 2022-11-28 – 2022-11-29 (×2): 40 mg via ORAL
  Filled 2022-11-27 (×2): qty 1

## 2022-11-27 MED ORDER — ORAL CARE MOUTH RINSE: 15.0000 mL | OROMUCOSAL | Status: DC | PRN

## 2022-11-27 MED ORDER — FUROSEMIDE 40 MG PO TABS
40.0000 mg | ORAL_TABLET | Freq: Two times a day (BID) | ORAL | Status: DC
  Administered 2022-11-27 (×2): 40 mg via ORAL
  Filled 2022-11-27 (×2): qty 1

## 2022-11-27 NOTE — Consult Note (Signed)
Reason for Consult:Left tibia plateau fx Referring Physician: Willeen Niece Time called: 1223 Time at bedside: 1337   Erica Ball is an 72 y.o. female.  HPI: Dorsie fell an fractured her tibia plateau about a month ago. She missed her OP f/u with Dr. Odis Hollingshead and has been readmitted for the last 10d. Orthopedic surgery was asked to follow up as she missed her appointment. She has no new c/o regarding the knee.  Past Medical History:  Diagnosis Date   Acute kidney injury (HCC) 07/10/2017   Acute respiratory failure with hypoxia (HCC) 07/10/2017   Alcohol abuse    Allergy    Cataract    Diverticulitis 10/30/2011   Elevated troponin 07/10/2017   Hypertension    Lobar pneumonia (HCC) 07/10/2017   Pleural effusion on right 07/10/2017   Sepsis (HCC) 07/10/2017   SVT (supraventricular tachycardia)    Tobacco abuse    UTI (urinary tract infection) 07/10/2017   Vitamin D deficiency 10/30/2011    Past Surgical History:  Procedure Laterality Date   ABDOMINAL HYSTERECTOMY     CATARACT EXTRACTION     FRACTURE SURGERY     IR THORACENTESIS ASP PLEURAL SPACE W/IMG GUIDE  07/11/2017   VIDEO ASSISTED THORACOSCOPY (VATS)/EMPYEMA Right 07/12/2017   Procedure: right VIDEO ASSISTED THORACOSCOPY (VATS) for drainage of EMPYEMA and decortication;  Surgeon: Loreli Slot, MD;  Location: [facility] OR;  Service: Thoracic;  Laterality: Right;   VIDEO BRONCHOSCOPY N/A 07/12/2017   Procedure: VIDEO BRONCHOSCOPY;  Surgeon: Loreli Slot, MD;  Location: [facility] OR;  Service: Thoracic;  Laterality: N/A;    Family History  Problem Relation Age of Onset   Hypertension Mother    Heart disease Mother    Hypertension Father    Heart disease Father    Stroke Brother    Hypertension Brother    Heart disease Brother     Social History:  reports that she has been smoking cigarettes. She has a 79.5 pack-year smoking history. She has never used smokeless tobacco. She reports current alcohol use of  about 21.0 standard drinks of alcohol per week. She reports that she does not use drugs.  Allergies:  Allergies  Allergen Reactions   Dextromethorphan Other (See Comments)    Makes her very angry and she wants to hurt people    Medications: I have reviewed the patient's current medications.  Results for orders placed or performed during the hospital encounter of 11/16/22 (from the past 48 hour(s))  CBC     Status: Abnormal   Collection Time: 11/26/22  4:07 AM  Result Value Ref Range   WBC 7.6 4.0 - 10.5 K/uL   RBC 3.41 (L) 3.87 - 5.11 MIL/uL   Hemoglobin 11.2 (L) 12.0 - 15.0 g/dL   HCT 16.1 (L) 09.6 - 04.5 %   MCV 96.2 80.0 - 100.0 fL   MCH 32.8 26.0 - 34.0 pg   MCHC 34.1 30.0 - 36.0 g/dL   RDW 40.9 81.1 - 91.4 %   Platelets 274 150 - 400 K/uL   nRBC 0.0 0.0 - 0.2 %    Comment: Performed at [facility], [address] [place], [place] [address]  Magnesium     Status: None   Collection Time: 11/26/22  4:07 AM  Result Value Ref Range   Magnesium 1.9 1.7 - 2.4 mg/dL    Comment: Performed at [facility], [address] [place], [place] [address]  Basic metabolic panel     Status:  Abnormal   Collection Time: 11/26/22  4:07 AM  Result Value Ref Range   Sodium 124 (L) 135 - 145 mmol/L   Potassium 3.8 3.5 - 5.1 mmol/L   Chloride 86 (L) 98 - 111 mmol/L   CO2 27 22 - 32 mmol/L   Glucose, Bld 102 (H) 70 - 99 mg/dL    Comment: Glucose reference range applies only to samples taken after fasting for at least 8 hours.   BUN 26 (H) 8 - 23 mg/dL   Creatinine, Ser 1.61 0.44 - 1.00 mg/dL   Calcium 9.1 8.9 - 09.6 mg/dL   GFR, Estimated >04 >54 mL/min    Comment: (NOTE) Calculated using the CKD-EPI Creatinine Equation (2021)    Anion gap 11 5 - 15    Comment: Performed at [facility], [address], [place], [place] [address]  Phosphorus     Status: None   Collection Time: 11/26/22  4:07 AM  Result Value Ref Range   Phosphorus 4.4 2.5 - 4.6 mg/dL    Comment:  Performed at [facility], [address] [place], [place] [address]  Sodium, urine, random     Status: None   Collection Time: 11/26/22  1:29 PM  Result Value Ref Range   Sodium, Ur 42 mmol/L    Comment: Performed at [facility], [address] [place], [place] [address]  Osmolality, urine     Status: None   Collection Time: 11/26/22  1:29 PM  Result Value Ref Range   Osmolality, Ur 487 300 - 900 mOsm/kg    Comment: Performed at [facility], [address], [place], [place] [address]  Basic metabolic panel     Status: Abnormal   Collection Time: 11/26/22  1:33 PM  Result Value Ref Range   Sodium 124 (L) 135 - 145 mmol/L   Potassium 4.3 3.5 - 5.1 mmol/L   Chloride 88 (L) 98 - 111 mmol/L   CO2 27 22 - 32 mmol/L   Glucose, Bld 121 (H) 70 - 99 mg/dL    Comment: Glucose reference range applies only to samples taken after fasting for at least 8 hours.   BUN 55 (H) 8 - 23 mg/dL   Creatinine, Ser 5.78 0.44 - 1.00 mg/dL   Calcium 9.1 8.9 - 46.9 mg/dL   GFR, Estimated >62 >95 mL/min    Comment: (NOTE) Calculated using the CKD-EPI Creatinine Equation (2021)    Anion gap 9 5 - 15    Comment: Performed at [facility], [address] [place], [place] [address]  Glucose, capillary     Status: Abnormal   Collection Time: 11/26/22  4:53 PM  Result Value Ref Range   Glucose-Capillary 127 (H) 70 - 99 mg/dL    Comment: Glucose reference range applies only to samples taken after fasting for at least 8 hours.  CBC     Status: Abnormal   Collection Time: 11/27/22 12:15 AM  Result Value Ref Range   WBC 9.9 4.0 - 10.5 K/uL   RBC 3.22 (L) 3.87 - 5.11 MIL/uL   Hemoglobin 10.8 (L) 12.0 - 15.0 g/dL   HCT 24.4 (L) 01.0 - 27.2 %   MCV 98.1 80.0 - 100.0 fL   MCH 33.5 26.0 - 34.0 pg   MCHC 34.2 30.0 - 36.0 g/dL   RDW 53.6 64.4 - 03.4 %   Platelets 270 150 - 400 K/uL   nRBC 0.0 0.0 - 0.2 %  Comment: Performed at [facility], [address] [place], [place]  [address]  Basic metabolic panel     Status: Abnormal   Collection Time: 11/27/22 12:15 AM  Result Value Ref Range   Sodium 127 (L) 135 - 145 mmol/L   Potassium 3.8 3.5 - 5.1 mmol/L   Chloride 88 (L) 98 - 111 mmol/L   CO2 29 22 - 32 mmol/L   Glucose, Bld 115 (H) 70 - 99 mg/dL    Comment: Glucose reference range applies only to samples taken after fasting for at least 8 hours.   BUN 109 (H) 8 - 23 mg/dL   Creatinine, Ser 6.96 0.44 - 1.00 mg/dL   Calcium 9.1 8.9 - 29.5 mg/dL   GFR, Estimated >28 >41 mL/min    Comment: (NOTE) Calculated using the CKD-EPI Creatinine Equation (2021)    Anion gap 10 5 - 15    Comment: Performed at [facility], [address], [place], [place] [address]    DG Knee 1-2 Views Left  Result Date: 11/26/2022 CLINICAL DATA:  Left knee pain for 1 month. Known left tibial plateau fracture. EXAM: LEFT KNEE - 1-2 VIEW COMPARISON:  Left knee radiographs 11/22/2022, CT left knee 10/23/2022 FINDINGS: There is diffuse decreased bone mineralization. There is again an oblique fracture extending from the lateral cortex of the proximal tibial metaphysis proximally and medially into the medial tibial spine and medial tibial plateau, better seen on prior CT. There is minimal healing sclerosis around these fracture lines, similar to 11/22/2022. There is also sclerosis at the medial tibial plateau where there is a mildly depressed fracture better seen on prior CT. Tiny joint effusion, similar to 11/22/2022 and again decreased from 10/23/2022. Chondrocalcinosis is again seen within the medial and lateral compartments. Moderate atherosclerotic calcifications. IMPRESSION: 1. Healing proximal tibial subacute fractures, similar to 11/22/2022. Fracture lines again remain visible. 2. Tiny joint effusion, similar to 11/22/2022 and again decreased from 10/23/2022. Electronically Signed   By: Neita Garnet M.D.   On: 11/26/2022 12:09    Review of Systems  HENT:  Negative for ear discharge,  ear pain, hearing loss and tinnitus.   Eyes:  Negative for photophobia and pain.  Respiratory:  Negative for cough and shortness of breath.   Cardiovascular:  Negative for chest pain.  Gastrointestinal:  Negative for abdominal pain, nausea and vomiting.  Genitourinary:  Negative for dysuria, flank pain, frequency and urgency.  Musculoskeletal:  Positive for arthralgias (Left knee). Negative for back pain, myalgias and neck pain.  Neurological:  Negative for dizziness and headaches.  Hematological:  Does not bruise/bleed easily.  Psychiatric/Behavioral:  The patient is not nervous/anxious.    Blood pressure 123/61, pulse 63, temperature 97.6 F (36.4 C), temperature source Oral, resp. rate 18, height 5\' 8"  (1.727 m), weight 58.5 kg, SpO2 92%. Physical Exam Constitutional:      General: She is not in acute distress.    Appearance: She is well-developed. She is not diaphoretic.  HENT:     Head: Normocephalic and atraumatic.  Eyes:     General: No scleral icterus.       Right eye: No discharge.        Left eye: No discharge.     Conjunctiva/sclera: Conjunctivae normal.  Cardiovascular:     Rate and Rhythm: Normal rate and regular rhythm.  Pulmonary:     Effort: Pulmonary effort is normal. No respiratory distress.  Musculoskeletal:     Cervical back: Normal range of  motion.     Comments: LLE No traumatic wounds, ecchymosis, or rash  KI in place  No ankle effusion  Sens DPN, SPN, TN intact  Motor EHL, ext, flex, evers 5/5  DP 2+, PT 2+, No significant edema  Skin:    General: Skin is warm and dry.  Neurological:     Mental Status: She is alert.  Psychiatric:        Mood and Affect: Mood normal.        Behavior: Behavior normal.     Assessment/Plan: Left tibia plateau fx -- Repeat x-rays look stable. Continue KI and NWB. F/u with Dr. Odis Hollingshead in 2-3 weeks.    Freeman Caldron, PA-C Orthopedic Surgery [phone] 11/27/2022, 1:45 PM

## 2022-11-27 NOTE — Plan of Care (Signed)
?  Problem: Education: ?Goal: Knowledge of General Education information will improve ?Description: Including pain rating scale, medication(s)/side effects and non-pharmacologic comfort measures ?Outcome: Progressing ?  ?Problem: Clinical Measurements: ?Goal: Ability to maintain clinical measurements within normal limits will improve ?Outcome: Progressing ?Goal: Will remain free from infection ?Outcome: Progressing ?Goal: Diagnostic test results will improve ?Outcome: Progressing ?Goal: Respiratory complications will improve ?Outcome: Progressing ?Goal: Cardiovascular complication will be avoided ?Outcome: Progressing ?  ?Problem: Nutrition: ?Goal: Adequate nutrition will be maintained ?Outcome: Progressing ?  ?Problem: Safety: ?Goal: Ability to remain free from injury will improve ?Outcome: Progressing ?  ?Problem: Skin Integrity: ?Goal: Risk for impaired skin integrity will decrease ?Outcome: Progressing ?  ?

## 2022-11-27 NOTE — Progress Notes (Signed)
Occupational Therapy Treatment Patient Details Name: Erica Ball MRN: [id number] DOB: Jan 12, 1951 Today's Date: 11/27/2022   History of present illness 72 year old female admitted 7/12 from SNF with hyponatremia, Hypomagnesemia/hypokalemia, and suspected UTI. Recent admission 6/18 after fall, found to have left tibial plateau fracture. PMHx: alcohol abuse, tobacco abuse, HTN, COPD, mobility issues   OT comments  Pt progressing towards goals this session, needing set up A for seated grooming tasks. Pt min A +2 for standing from chair level, cues for hand placement and needing therapist's foot placed behind pt's LLE to promote NWB in KI. Pt needing increased time to follow commands throughout session. Pt presenting with impairments listed below, will follow acutely. Patient will benefit from continued inpatient follow up therapy, <3 hours/day to maximize safety/ind with ADLs/functional mobility.    Recommendations for follow up therapy are one component of a multi-disciplinary discharge planning process, led by the attending physician.  Recommendations may be updated based on patient status, additional functional criteria and insurance authorization.    Assistance Recommended at Discharge Frequent or constant Supervision/Assistance  Patient can return home with the following  A lot of help with walking and/or transfers;A lot of help with bathing/dressing/bathroom;Assistance with cooking/housework;Direct supervision/assist for medications management;Direct supervision/assist for financial management;Assist for transportation;Help with stairs or ramp for entrance   Equipment Recommendations  Other (comment) (defer)    Recommendations for Other Services PT consult    Precautions / Restrictions Precautions Precautions: Fall Required Braces or Orthoses: Knee Immobilizer - Left Knee Immobilizer - Left: On at all times Restrictions Weight Bearing Restrictions: Yes RLE Weight Bearing: Weight  bearing as tolerated LLE Weight Bearing: Non weight bearing       Mobility Bed Mobility               General bed mobility comments: OOB in chair upon arrival    Transfers Overall transfer level: Needs assistance Equipment used: Rolling walker (2 wheels) Transfers: Sit to/from Stand, Bed to chair/wheelchair/BSC Sit to Stand: Min assist, +2 safety/equipment                 Balance Overall balance assessment: Needs assistance, History of Falls Sitting-balance support: No upper extremity supported, Feet supported Sitting balance-Leahy Scale: Fair     Standing balance support: During functional activity, Bilateral upper extremity supported, Reliant on assistive device for balance Standing balance-Leahy Scale: Poor Standing balance comment: reliant on RW for support                           ADL either performed or assessed with clinical judgement   ADL Overall ADL's : Needs assistance/impaired     Grooming: Oral care;Brushing hair;Sitting;Wash/dry face;Set up Grooming Details (indicate cue type and reason): seated in chair at sink                 Toilet Transfer: Minimal assistance;+2 for safety/equipment Toilet Transfer Details (indicate cue type and reason): placing therapist's foot behind pt's LLE to promote NWB         Functional mobility during ADLs: Rolling walker (2 wheels);Minimal assistance;+2 for safety/equipment      Extremity/Trunk Assessment Upper Extremity Assessment Upper Extremity Assessment: Generalized weakness   Lower Extremity Assessment Lower Extremity Assessment: Defer to PT evaluation        Vision   Vision Assessment?: No apparent visual deficits   Perception Perception Perception: Not tested   Praxis Praxis Praxis: Not tested    Cognition Arousal/Alertness: Awake/alert Behavior  During Therapy: WFL for tasks assessed/performed, Impulsive Overall Cognitive Status: Impaired/Different from baseline Area  of Impairment: Memory, Following commands, Safety/judgement, Problem solving, Awareness                 Orientation Level: Disoriented to, Time, Situation   Memory: Decreased short-term memory Following Commands: Follows one step commands with increased time Safety/Judgement: Decreased awareness of safety Awareness: Anticipatory Problem Solving: Difficulty sequencing, Requires verbal cues, Slow processing, Decreased initiation          Exercises      Shoulder Instructions       General Comments      Pertinent Vitals/ Pain       Pain Assessment Pain Assessment: No/denies pain  Home Living                                          Prior Functioning/Environment              Frequency  Min 1X/week        Progress Toward Goals  OT Goals(current goals can now be found in the care plan section)  Progress towards OT goals: Progressing toward goals  Acute Rehab OT Goals Patient Stated Goal: none stated OT Goal Formulation: With patient Time For Goal Achievement: 12/02/22 Potential to Achieve Goals: Good ADL Goals Pt Will Perform Grooming: sitting;with min guard assist Pt Will Perform Upper Body Dressing: with supervision;sitting Pt Will Perform Lower Body Dressing: with supervision;with adaptive equipment;sitting/lateral leans;sit to/from stand Pt Will Transfer to Toilet: with supervision;squat pivot transfer;stand pivot transfer;bedside commode Pt/caregiver will Perform Home Exercise Program: Both right and left upper extremity;With written HEP provided;Increased strength;Increased ROM;With Supervision Additional ADL Goal #1: pt will perform bed mobility mod I in prep for ADLs  Plan Discharge plan remains appropriate;Frequency remains appropriate    Co-evaluation                 AM-PAC OT "6 Clicks" Daily Activity     Outcome Measure   Help from another person eating meals?: A Little Help from another person taking care of  personal grooming?: A Little Help from another person toileting, which includes using toliet, bedpan, or urinal?: A Little Help from another person bathing (including washing, rinsing, drying)?: A Lot Help from another person to put on and taking off regular upper body clothing?: A Little Help from another person to put on and taking off regular lower body clothing?: A Lot 6 Click Score: 16    End of Session Equipment Utilized During Treatment: Gait belt;Rolling walker (2 wheels);Left knee immobilizer  OT Visit Diagnosis: Unsteadiness on feet (R26.81);Other abnormalities of gait and mobility (R26.89);History of falling (Z91.81);Muscle weakness (generalized) (M62.81) Pain - Right/Left: Left Pain - part of body: Knee   Activity Tolerance Patient tolerated treatment well   Patient Left in chair;with call bell/phone within reach;with chair alarm set   Nurse Communication Mobility status        Time: 1478-2956 OT Time Calculation (min): 22 min  Charges: OT General Charges $OT Visit: 1 Visit OT Treatments $Self Care/Home Management : 8-22 mins  Carver Fila, OTD, OTR/L SecureChat Preferred Acute Rehab [phone]   Carver Fila Koonce 11/27/2022, 12:56 PM

## 2022-11-27 NOTE — TOC Progression Note (Signed)
Transition of Care [facility]) - Progression Note    Patient Details  Name: Erica Ball MRN: [id number] Date of Birth: [birth year]  Transition of Care [facility]) CM/SW Contact  Delilah Shan, LCSWA Phone Number: 11/27/2022, 4:31 PM  Clinical Narrative:     Patient has SNF bed at [facility] when medically ready. CSW restarted insurance authorization for patient. Auth id# [id number]. CSW will continue to follow.  Expected Discharge Plan: Skilled Nursing Facility Barriers to Discharge: Continued Medical Work up  Expected Discharge Plan and Services In-house Referral: Clinical Social Work   Post Acute Care Choice:  (pt wants to talk to family about DC home) Living arrangements for the past 2 months: Single Family Home                                       Social Determinants of Health (SDOH) Interventions SDOH Screenings   Food Insecurity: No Food Insecurity (11/17/2022)  Housing: Low Risk  (11/17/2022)  Transportation Needs: No Transportation Needs (11/17/2022)  Utilities: Not At Risk (11/17/2022)  Alcohol Screen: Low Risk  (11/17/2022)  Depression (PHQ2-9): Low Risk  (11/17/2022)  Financial Resource Strain: Patient Unable To Answer (11/17/2022)  Physical Activity: Sufficiently Active (11/17/2022)  Social Connections: Socially Isolated (11/17/2022)  Stress: No Stress Concern Present (11/17/2022)  Tobacco Use: High Risk (11/16/2022)  Health Literacy: Inadequate Health Literacy (11/17/2022)    Readmission Risk Interventions     No data to display

## 2022-11-27 NOTE — Progress Notes (Signed)
Mobility Specialist Progress Note:    11/27/22 1144  Mobility  Activity Transferred to/from [facility];Transferred from bed to chair  Level of Assistance Minimal assist, patient does 75% or more  Assistive Device Front wheel walker  Distance Ambulated (ft) 5 ft  LLE Weight Bearing NWB  Activity Response Tolerated well  Mobility Referral Yes  $Mobility charge 1 Mobility  Mobility Specialist Start Time (ACUTE ONLY) 1037  Mobility Specialist Stop Time (ACUTE ONLY) 1055  Mobility Specialist Time Calculation (min) (ACUTE ONLY) 18 min   Pt received in bed, requesting to use BSC, void complete. For bed mobility, pt needed no physical assistance but for STS pt needed MinA. Pt had to be reminded to keep weight off of her LLE. She were able to demonstrate a hop-to gait for a couple of steps, until she had to put her L foot down d/t fatigue. Pt situated in recliner w/ call bell and personal belongings at reach. Chair alarm on.    Thompson Grayer Mobility Specialist  Please contact vis Secure Chat or  Rehab Office [phone]

## 2022-11-27 NOTE — Progress Notes (Addendum)
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  Subjective:  Sna back to 127 Repeat urine studies c/w SIADH Mild confusion UOP robust with lasix  07/22 0701 - 07/23 0700 In: 550 [P.O.:550] Out: 1700 [Urine:1700]  Filed Weights   11/17/22 0100 11/23/22 1434  Weight: 55.7 kg 58.5 kg    Scheduled Meds:  enoxaparin (LOVENOX) injection  40 mg Subcutaneous Q24H   feeding supplement  237 mL Oral TID BM   folic acid  1 mg Oral Daily   furosemide  40 mg Oral BID   magnesium oxide  400 mg Oral Daily   metoprolol succinate  100 mg Oral Daily   multivitamin with minerals  1 tablet Oral Daily   pantoprazole  40 mg Oral Daily   pneumococcal 20-valent conjugate vaccine  0.5 mL Intramuscular Tomorrow-1000   rosuvastatin  10 mg Oral Daily   [START ON 11/30/2022] thiamine  100 mg Oral Daily   urea  30 g Oral BID   Continuous Infusions:  thiamine (VITAMIN B1) injection 250 mg (11/27/22 0927)   PRN Meds:.acetaminophen, albuterol, labetalol, melatonin, mouth rinse, polyethylene glycol, prochlorperazine  Current Labs: reviewed   Latest Reference Range & Units 11/20/22 02:46  Osmolality, Urine 300 - 900 mOsm/kg 470  Sodium, Urine mmol/L 45    Physical Exam:  Blood pressure 123/61, pulse 63, temperature 97.6 F (36.4 C), temperature source Oral, resp. rate 18, height 5\' 8"  (1.727 m), weight 58.5 kg, SpO2 92%. Chrnically ill appearing, lying in bed in nad RRR, no rub CTAB, no iwob No LEE, warm and well perfused Nonfocal, mild confusion  A Hyponatremia now most c/w SIADH but also likely some hypevolemia AMS, not related to #1, likely hospital delirium, per TRH HTN  P Continue urea 30g bid Switch to oral lasix 40mg  BID Repeat BMP this afternoon Goal Na of 133-135 BP improved with diuresis; stop hydral  [name]  11/27/2022, 10:11 AM  Recent Labs  Lab 11/23/22 1223 11/24/22 0844 11/25/22 0644 11/26/22 0407 11/26/22 1333  11/27/22 0015  NA 123*   < > 127* 124* 124* 127*  K 3.8   < > 4.0 3.8 4.3 3.8  CL 87*   < > 88* 86* 88* 88*  CO2 29   < > 30 27 27 29   GLUCOSE 95   < > 102* 102* 121* 115*  BUN 38*   < > 27* 26* 55* 109*  CREATININE 0.66   < > 0.56 0.66 0.64 0.80  CALCIUM 9.0   < > 9.3 9.1 9.1 9.1  PHOS 4.3  --  3.9 4.4  --   --    < > = values in this interval not displayed.   Recent Labs  Lab 11/23/22 1223 11/25/22 0644 11/26/22 0407 11/27/22 0015  WBC 7.2 8.0 7.6 9.9  NEUTROABS 5.4  --   --   --   HGB 12.1 11.4* 11.2* 10.8*  HCT 35.7* 32.8* 32.8* 31.6*  MCV 96.0 97.3 96.2 98.1  PLT 286 241 274 270

## 2022-11-27 NOTE — Progress Notes (Signed)
PROGRESS NOTE    DIONNA WIEDEMANN  GNF:[id number] DOB: 06-22-1950 DOA: 11/16/2022 PCP: Norm Salt, PA   Brief Narrative:  This 72 year old female with history of alcohol abuse, tobacco abuse, hypertension, COPD who presented from skilled nursing facility with complaint of generalized weakness, confusion compared to baseline.  Lab work at [facility] showed a sodium of 120 and was sent to the emergency department.  Sodium of 119 on presentation.  UA was suspicious for UTI.  Patient was started on IV fluids, ceftriaxone.  PT/OT recommending SNF.  Hospital course remarkable for persistent hyponatremia, nephrology consulted due to persistent hyponatremia/drop in Na.    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 -Serum sodium improved to 124 >127.  Nephrology : anticipated discharge if sodium around 130. -Nephrology  increased urea to 30 mg bid. Sodium fluctuating.   Hypomagnesemia/ Hypokalemia:  -Supplemented and corrected.   Suspected UTI:  -Complains of some dysuria, urine culture showed multiple cyst.    -She has finished 3 days of antibiotics with ceftriaxone.   History of alcohol use/tobacco use:  Counseled cessation.  Continue thiamine, folic acid.   -Drinks hard liquor every day, smokes half packs a day.   Hypertension: -Continue metoprolol, hydralazine.   -Dose of hydralazine increased during this hospitalization.   H/O SVT:  -Continue metoprolol. -Currently in sinus rhythm.   Hyperlipidemia: On Crestor   Recent history of left knee fracture:  Reviewed last orthopedics note.  She was supposed to be following up with orthopedics. Has fractured patella. She was recommended NWB on left LE, and was supposed to  follow up with Orthopedics Dr Odis Hollingshead.   Therapy messaged ortho regarding weight  bearing status (would reach out tomorrow if no note, they've ordered imaging)   Reported Blood in Stool. Hb stable, monitor for now, would recommend outpatient follow up unless BRBPR or melena noted here or drop in Hb/Hct. H/ H stable.   Generalized weakness / deconditioning:  Patient is from SNF.  PT/OT consulted here, recommended SNF.   Apparently has bed at SNF (not yet ready for d/c with unimproved hyponatremia).   Skilled nursing facility had concern regarding her mental health saying that she had suicidal ideations in the past.   Patient strictly denies any suicidal ideation at present per 7/17 note.   DVT prophylaxis: Lovenox Code Status: Full code Family Communication: No family at bed side. Disposition Plan:     Status is: Inpatient Remains inpatient appropriate because:continued need for inpatient care.    Consultants:  Nephrology [facility]  Procedures: None  Antimicrobials: Anti-infectives (From admission, onward)    Start     Dose/Rate Route Frequency Ordered Stop   11/17/22 1000  cefTRIAXone (ROCEPHIN) 2 g in sodium chloride 0.9 % 100 mL IVPB        2 g 200 mL/hr over 30 Minutes Intravenous Every 24 hours 11/16/22 2014 11/19/22 1336   11/16/22 2015  cefTRIAXone (ROCEPHIN) 1 g in sodium chloride 0.9 % 100 mL IVPB        1 g 200 mL/hr over 30 Minutes Intravenous  Once 11/16/22 2001 11/16/22 2043      Subjective: Patient was seen and examined at bed side, overnight events noted,  Serum sodium is improving,  sodium improved to 127.  Patient denies any other concerns.  Objective: Vitals:   11/26/22 2013 11/26/22 2256  11/27/22 0502 11/27/22 0801  BP: 109/65 116/75 (!) 128/56 123/61  Pulse: 66  61 63  Resp: 18  18 18   Temp: 97.8 F (36.6 C)  97.8 F (36.6 C) 97.6 F (36.4 C)  TempSrc: Oral  Oral Oral  SpO2: 94%  92% 92%  Weight:      Height:        Intake/Output Summary (Last 24 hours) at 11/27/2022 1225 Last data filed at 11/27/2022 1100 Gross  per 24 hour  Intake 1270 ml  Output 1700 ml  Net -430 ml   Filed Weights   11/17/22 0100 11/23/22 1434  Weight: 55.7 kg 58.5 kg    Examination:  General exam: Appears comfortable, not in acute distress, deconditioned. Respiratory system: CTA bilaterally, respiratory effort normal, RR 14. Cardiovascular system: S1 & S2 heard, regular rate and rhythm, no murmur. Gastrointestinal system: Abdomen is soft, non tender, non distended, normal bowel sounds heard. Central nervous system: Alert and oriented x 2. No focal neurological deficits. Extremities: No edema, no cyanosis, no clubbing Skin: No rashes, lesions or ulcers Psychiatry: Judgement and insight appear normal. Mood & affect appropriate.     Data Reviewed: I have personally reviewed following labs and imaging studies  CBC: Recent Labs  Lab 11/22/22 1706 11/23/22 1223 11/25/22 0644 11/26/22 0407 11/27/22 0015  WBC  --  7.2 8.0 7.6 9.9  NEUTROABS  --  5.4  --   --   --   HGB 12.3 12.1 11.4* 11.2* 10.8*  HCT 35.8* 35.7* 32.8* 32.8* 31.6*  MCV  --  96.0 97.3 96.2 98.1  PLT  --  286 241 274 270   Basic Metabolic Panel: Recent Labs  Lab 11/22/22 0054 11/22/22 1336 11/23/22 1223 11/24/22 0844 11/25/22 0644 11/26/22 0407 11/26/22 1333 11/27/22 0015  NA 122*   < > 123* 126* 127* 124* 124* 127*  K 4.0  --  3.8 4.2 4.0 3.8 4.3 3.8  CL 87*  --  87* 88* 88* 86* 88* 88*  CO2 25  --  29 30 30 27 27 29   GLUCOSE 101*  --  95 120* 102* 102* 121* 115*  BUN 11  --  38* 28* 27* 26* 55* 109*  CREATININE 0.49  --  0.66 0.60 0.56 0.66 0.64 0.80  CALCIUM 8.6*  --  9.0 9.3 9.3 9.1 9.1 9.1  MG 1.5*  --  1.7  --  1.9 1.9  --   --   PHOS  --   --  4.3  --  3.9 4.4  --   --    < > = values in this interval not displayed.   GFR: Estimated Creatinine Clearance: 57.8 mL/min (by C-G formula based on SCr of 0.8 mg/dL). Liver Function Tests: Recent Labs  Lab 11/23/22 1223  AST 22  ALT 26  ALKPHOS 45  BILITOT 0.2*  PROT 6.1*   ALBUMIN 3.3*   No results for input(s): "LIPASE", "AMYLASE" in the last 168 hours. No results for input(s): "AMMONIA" in the last 168 hours. Coagulation Profile: No results for input(s): "INR", "PROTIME" in the last 168 hours. Cardiac Enzymes: No results for input(s): "CKTOTAL", "CKMB", "CKMBINDEX", "TROPONINI" in the last 168 hours. BNP (last 3 results) No results for input(s): "PROBNP" in the last 8760 hours. HbA1C: No results for input(s): "HGBA1C" in the last 72 hours. CBG: Recent Labs  Lab 11/26/22 1653  GLUCAP 127*   Lipid Profile: No results for input(s): "CHOL", "HDL", "LDLCALC", "TRIG", "CHOLHDL", "LDLDIRECT" in the  last 72 hours. Thyroid Function Tests: No results for input(s): "TSH", "T4TOTAL", "FREET4", "T3FREE", "THYROIDAB" in the last 72 hours. Anemia Panel: No results for input(s): "VITAMINB12", "FOLATE", "FERRITIN", "TIBC", "IRON", "RETICCTPCT" in the last 72 hours. Sepsis Labs: No results for input(s): "PROCALCITON", "LATICACIDVEN" in the last 168 hours.  No results found for this or any previous visit (from the past 240 hour(s)).    Radiology Studies: DG Knee 1-2 Views Left  Result Date: 11/26/2022 CLINICAL DATA:  Left knee pain for 1 month. Known left tibial plateau fracture. EXAM: LEFT KNEE - 1-2 VIEW COMPARISON:  Left knee radiographs 11/22/2022, CT left knee 10/23/2022 FINDINGS: There is diffuse decreased bone mineralization. There is again an oblique fracture extending from the lateral cortex of the proximal tibial metaphysis proximally and medially into the medial tibial spine and medial tibial plateau, better seen on prior CT. There is minimal healing sclerosis around these fracture lines, similar to 11/22/2022. There is also sclerosis at the medial tibial plateau where there is a mildly depressed fracture better seen on prior CT. Tiny joint effusion, similar to 11/22/2022 and again decreased from 10/23/2022. Chondrocalcinosis is again seen within the  medial and lateral compartments. Moderate atherosclerotic calcifications. IMPRESSION: 1. Healing proximal tibial subacute fractures, similar to 11/22/2022. Fracture lines again remain visible. 2. Tiny joint effusion, similar to 11/22/2022 and again decreased from 10/23/2022. Electronically Signed   By: Neita Garnet M.D.   On: 11/26/2022 12:09    Scheduled Meds:  enoxaparin (LOVENOX) injection  40 mg Subcutaneous Q24H   feeding supplement  237 mL Oral TID BM   folic acid  1 mg Oral Daily   furosemide  40 mg Oral BID   magnesium oxide  400 mg Oral Daily   metoprolol succinate  100 mg Oral Daily   multivitamin with minerals  1 tablet Oral Daily   pantoprazole  40 mg Oral Daily   pneumococcal 20-valent conjugate vaccine  0.5 mL Intramuscular Tomorrow-1000   rosuvastatin  10 mg Oral Daily   [START ON 11/30/2022] thiamine  100 mg Oral Daily   urea  30 g Oral BID   Continuous Infusions:  thiamine (VITAMIN B1) injection 250 mg (11/27/22 0927)     LOS: 11 days    Time spent: 35 mins    Willeen Niece, MD [facility]   If 7PM-7AM, please contact night-coverage

## 2022-11-27 NOTE — Plan of Care (Signed)

## 2022-11-28 DIAGNOSIS — E871 Hypo-osmolality and hyponatremia: Secondary | ICD-10-CM | POA: Diagnosis not present

## 2022-11-28 LAB — BASIC METABOLIC PANEL
Anion gap: 15 (ref 5–15)
BUN: 131 mg/dL — ABNORMAL HIGH (ref 8–23)
CO2: 34 mmol/L — ABNORMAL HIGH (ref 22–32)
Calcium: 10.1 mg/dL (ref 8.9–10.3)
Chloride: 86 mmol/L — ABNORMAL LOW (ref 98–111)
Creatinine, Ser: 0.88 mg/dL (ref 0.44–1.00)
GFR, Estimated: >60 mL/min (ref >60)
Glucose, Bld: 118 mg/dL — ABNORMAL HIGH (ref 70–99)
Potassium: 3.7 mmol/L (ref 3.5–5.1)
Sodium: 135 mmol/L (ref 135–145)

## 2022-11-28 MED ORDER — LORAZEPAM 1 MG PO TABS: 1.0000 mg | ORAL_TABLET | Freq: Every day | ORAL | 0 refills | Status: AC

## 2022-11-28 MED ORDER — MAGNESIUM OXIDE -MG SUPPLEMENT 400 (240 MG) MG PO TABS: 400.0000 mg | ORAL_TABLET | Freq: Every day | ORAL | 0 refills | Status: AC

## 2022-11-28 MED ORDER — VITAMIN B-12 1000 MCG PO TABS
1000.0000 ug | ORAL_TABLET | Freq: Every day | ORAL | Status: DC
  Administered 2022-11-28 – 2022-11-29 (×2): 1000 ug via ORAL
  Filled 2022-11-28 (×2): qty 1

## 2022-11-28 MED ORDER — FUROSEMIDE 40 MG PO TABS: 40.0000 mg | ORAL_TABLET | Freq: Every day | ORAL | 1 refills | Status: DC

## 2022-11-28 MED ORDER — PANTOPRAZOLE SODIUM 40 MG PO TBEC: 40.0000 mg | DELAYED_RELEASE_TABLET | Freq: Every day | ORAL | 1 refills | Status: AC

## 2022-11-28 MED ORDER — CYANOCOBALAMIN 1000 MCG PO TABS: 1000.0000 ug | ORAL_TABLET | Freq: Every day | ORAL | 1 refills | Status: AC

## 2022-11-28 MED ORDER — UREA 15 G PO PACK
15.0000 g | PACK | Freq: Two times a day (BID) | ORAL | Status: DC
  Administered 2022-11-28 (×2): 15 g via ORAL
  Filled 2022-11-28 (×3): qty 1

## 2022-11-28 MED ORDER — UREA 15 G PO PACK: 15.0000 g | PACK | Freq: Two times a day (BID) | ORAL | 0 refills | Status: DC

## 2022-11-28 NOTE — Progress Notes (Signed)
Physical Therapy Treatment Patient Details Name: Erica Ball MRN: [id number] DOB: Jun 12, 1950 Today's Date: 11/28/2022   History of Present Illness 72 year old female admitted 7/12 from SNF with hyponatremia, Hypomagnesemia/hypokalemia, and suspected UTI. Recent admission 6/18 after fall, found to have left tibial plateau fracture. PMHx: alcohol abuse, tobacco abuse, HTN, COPD, mobility issues    PT Comments  Pt is progressing slowly. Improved ability to maintain WB precautions on the LLE this session.  Pt continues at Mod A for sit to stand and transfers from EOB to recliner. Pt had difficutly staying on task and following directions this session and stated "I am so confused I feel so foggy." Discussed this with pt and pt states that since she has been in acute setting and rehab she has felt very confused when prior she was not confused. This was discussed with occupational therapy and pt may benefit from intervention from OT to address cognition. Due to current functional status, home set up and available assistance at home recommending skilled physical therapy services at a higher level of care and frequency on discharge from acute care hospital setting at 5x/weekly in order to decrease risk for falls, injury and re-hospitalization.     Assistance Recommended at Discharge Frequent or constant Supervision/Assistance  If plan is discharge home, recommend the following:  Can travel by private vehicle    Assist for transportation;Help with stairs or ramp for entrance;A lot of help with walking and/or transfers;Assistance with cooking/housework   No  Equipment Recommendations  None recommended by PT    Recommendations for Other Services       Precautions / Restrictions Precautions Precautions: Fall Required Braces or Orthoses: Knee Immobilizer - Left Knee Immobilizer - Left: On at all times Other Brace: KI off upon PT arrival to room, states it was soiled and is drying in the  bathroom - PT reapplied Restrictions Weight Bearing Restrictions: Yes RLE Weight Bearing: Weight bearing as tolerated LLE Weight Bearing: Non weight bearing     Mobility  Bed Mobility Overal bed mobility: Needs Assistance Bed Mobility: Supine to Sit     Supine to sit: Min guard, HOB elevated       Patient Response: Cooperative  Transfers Overall transfer level: Needs assistance Equipment used: Rolling walker (2 wheels) Transfers: Sit to/from Stand, Bed to chair/wheelchair/BSC Sit to Stand: Mod assist   Step pivot transfers: Min assist       General transfer comment: Pt initially was unable to clear EOB with Max A, second attempt able to clear bed with Mod A and therapist foot under LLE in order to prevent WB with good adherence. Pt had difficulty following directions to back up toward the chair to sit and required Mod a to navigate AD for transfer. Pt was clearing the RLE well from the floor with verbal cues for sequencing and pushing through the arms while maintaining NWB on the LLE.    Ambulation/Gait Ambulation/Gait assistance: Mod assist Gait Distance (Feet): 3 Feet Assistive device: Rolling walker (2 wheels) Gait Pattern/deviations: Step-to pattern, Decreased step length - right Gait velocity: significantly decreased Gait velocity interpretation: <1.31 ft/sec, indicative of household ambulator   General Gait Details: Pt was able to maintain NWB status well with therapist foot under pt L foot and verbal cues for sequencing throughout with pushing through bil LE. Pt was able to clear the R foot from the floor with very low clearance.      Wheelchair Mobility     Tilt Bed Tilt Bed Patient Response:  Cooperative  Modified Rankin (Stroke Patients Only)       Balance Overall balance assessment: Needs assistance, History of Falls Sitting-balance support: No upper extremity supported, Feet supported Sitting balance-Leahy Scale: Fair Sitting balance - Comments:  sitting EOB   Standing balance support: During functional activity, Bilateral upper extremity supported, Reliant on assistive device for balance Standing balance-Leahy Scale: Poor Standing balance comment: reliant on RW for support        Cognition Arousal/Alertness: Awake/alert Behavior During Therapy:  (Pt had difficutly following directions and staying on task) Overall Cognitive Status: Impaired/Different from baseline Area of Impairment: Memory, Following commands, Safety/judgement, Problem solving, Awareness         Following Commands: Follows one step commands with increased time Safety/Judgement: Decreased awareness of safety              Exercises General Exercises - Lower Extremity Ankle Circles/Pumps: AROM, Both, Seated Quad Sets: Strengthening, Both, Seated, 10 reps Short Arc Quad: AROM, 5 reps, Left, Other (comment) (pain free)    General Comments        Pertinent Vitals/Pain Pain Assessment Pain Assessment: No/denies pain     PT Goals (current goals can now be found in the care plan section) Acute Rehab PT Goals Patient Stated Goal: Back to rehab, regain independence PT Goal Formulation: With patient Time For Goal Achievement: 12/01/22 Potential to Achieve Goals: Fair Progress towards PT goals: Progressing toward goals    Frequency    Min 1X/week      PT Plan Current plan remains appropriate       AM-PAC PT "6 Clicks" Mobility   Outcome Measure  Help needed turning from your back to your side while in a flat bed without using bedrails?: None Help needed moving from lying on your back to sitting on the side of a flat bed without using bedrails?: A Little Help needed moving to and from a bed to a chair (including a wheelchair)?: A Lot Help needed standing up from a chair using your arms (e.g., wheelchair or bedside chair)?: A Lot Help needed to walk in hospital room?: Total Help needed climbing 3-5 steps with a railing? : Total 6 Click  Score: 13    End of Session Equipment Utilized During Treatment: Gait belt;Left knee immobilizer Activity Tolerance: Patient limited by fatigue Patient left: in chair;with call bell/phone within reach;with chair alarm set Nurse Communication: Mobility status PT Visit Diagnosis: Unsteadiness on feet (R26.81);Muscle weakness (generalized) (M62.81);History of falling (Z91.81);Difficulty in walking, not elsewhere classified (R26.2) Pain - Right/Left: Left Pain - part of body: Knee;Leg     Time: 1610-9604 PT Time Calculation (min) (ACUTE ONLY): 18 min  Charges:    $Therapeutic Activity: 8-22 mins PT General Charges $$ ACUTE PT VISIT: 1 Visit                     Harrel Carina, DPT, CLT  Acute Rehabilitation Services Office: [phone] (Secure chat preferred)    Claudia Desanctis 11/28/2022, 11:15 AM

## 2022-11-28 NOTE — Progress Notes (Signed)
Mobility Specialist Progress Note:   11/28/22 1205  Mobility  Activity Transferred from bed to chair  Level of Assistance Minimal assist, patient does 75% or more  Assistive Device Front wheel walker  Distance Ambulated (ft) 5 ft  RLE Weight Bearing WBAT  LLE Weight Bearing NWB  Activity Response Tolerated well  Mobility Referral Yes  $Mobility charge 1 Mobility  Mobility Specialist Start Time (ACUTE ONLY) 1152  Mobility Specialist Stop Time (ACUTE ONLY) 1203  Mobility Specialist Time Calculation (min) (ACUTE ONLY) 11 min   Pt received in bed, requesting to get into her recliner. Pt perform bed mobility independently but needed MinA for STS. During transfer pt had to be reminded multiple time to keep her LLE NWB. Pt was able to take a couple steps towards chair. Situated in chair w/ call bell and personal belongings at reach. All needs met. Chair alarm on.  Thompson Grayer Mobility Specialist  Please contact vis Secure Chat or  Rehab Office [phone]

## 2022-11-28 NOTE — Care Management Important Message (Signed)
Important Message  Patient Details  Name: Erica Ball MRN: [id number] Date of Birth: 07-08-1950   Medicare Important Message Given:  Yes     Erica Ball 11/28/2022, 11:00 AM

## 2022-11-28 NOTE — TOC Transition Note (Addendum)
Transition of Care [facility]) - CM/SW Discharge Note   Patient Details  Name: Erica Ball MRN: [id number] Date of Birth: Sep 08, 1950  Transition of [facility]) CM/SW Contact:  Delilah Shan, LCSWA Phone Number: 11/28/2022, 12:06 PM   Clinical Narrative:      MD informed CSW that DC has been cancelled. CSW will notify facility. CSW will continue to follow.  Patient will DC to: [facility]  Anticipated DC date: 11/28/2022  Family notified: Fayrene Fearing  Transport by: Sharin Mons  ?  Per MD patient ready for DC to [facility] . RN, patient, patient's family, and facility notified of DC. Discharge Summary sent to facility. RN given number for report tele#[phone] RM# [phone] . DC packet on chart. Ambulance transport requested for patient.  CSW signing off.   Final next level of care: Skilled Nursing Facility Barriers to Discharge: No Barriers Identified   Patient Goals and CMS Choice   Choice offered to / list presented to : Patient, Adult Children  Discharge Placement                Patient chooses bed at: [facility] Patient to be transferred to facility by: [facility] Name of family member notified: Fayrene Fearing Patient and family notified of of transfer: 11/28/22  Discharge Plan and Services Additional resources added to the After Visit Summary for   In-house Referral: Clinical Social Work   Post Acute Care Choice:  (pt wants to talk to family about DC home)                               Social Determinants of Health (SDOH) Interventions SDOH Screenings   Food Insecurity: No Food Insecurity (11/17/2022)  Housing: Low Risk  (11/17/2022)  Transportation Needs: No Transportation Needs (11/17/2022)  Utilities: Not At Risk (11/17/2022)  Alcohol Screen: Low Risk  (11/17/2022)  Depression (PHQ2-9): Low Risk  (11/17/2022)  Financial Resource Strain: Patient Unable To Answer (11/17/2022)  Physical Activity: Sufficiently Active (11/17/2022)   Social Connections: Socially Isolated (11/17/2022)  Stress: No Stress Concern Present (11/17/2022)  Tobacco Use: High Risk (11/16/2022)  Health Literacy: Inadequate Health Literacy (11/17/2022)     Readmission Risk Interventions     No data to display

## 2022-11-28 NOTE — TOC Progression Note (Signed)
Transition of Care [facility]) - Progression Note    Patient Details  Name: Erica Ball MRN: [id number] Date of Birth: Oct 02, 1950  Transition of Care [facility]) CM/SW Contact  Delilah Shan, LCSWA Phone Number: 11/28/2022, 11:23 AM  Clinical Narrative:     [name] with [facility] confirmed patient can dc over today if medically ready. CSW informed MD. CSW will continue to follow and assist with patients dc planning needs.  Expected Discharge Plan: Skilled Nursing Facility Barriers to Discharge: Continued Medical Work up  Expected Discharge Plan and Services In-house Referral: Clinical Social Work   Post Acute Care Choice:  (pt wants to talk to family about DC home) Living arrangements for the past 2 months: Single Family Home Expected Discharge Date: 11/28/22                                     Social Determinants of Health (SDOH) Interventions SDOH Screenings   Food Insecurity: No Food Insecurity (11/17/2022)  Housing: Low Risk  (11/17/2022)  Transportation Needs: No Transportation Needs (11/17/2022)  Utilities: Not At Risk (11/17/2022)  Alcohol Screen: Low Risk  (11/17/2022)  Depression (PHQ2-9): Low Risk  (11/17/2022)  Financial Resource Strain: Patient Unable To Answer (11/17/2022)  Physical Activity: Sufficiently Active (11/17/2022)  Social Connections: Socially Isolated (11/17/2022)  Stress: No Stress Concern Present (11/17/2022)  Tobacco Use: High Risk (11/16/2022)  Health Literacy: Inadequate Health Literacy (11/17/2022)    Readmission Risk Interventions     No data to display

## 2022-11-28 NOTE — Discharge Summary (Signed)
Physician Discharge Summary  Erica Ball WGN:[id number] DOB: 03-Mar-1951 DOA: 11/16/2022  PCP: Norm Salt, PA  Admit date: 11/16/2022  Discharge date: 11/29/22.  Admitted From: Skilled Nursing Home.  Disposition:  SNF ([facility])  Recommendations for Outpatient Follow-up:  Follow up with PCP in 1-2 weeks. Please obtain BMP/CBC in one week. Advised to follow-up with Orthopedics Dr. Netta Cedars as scheduled. Advised to take Lasix 40 mg daily. Advised to take Urea 30gm daily in the am and urea 15 gm daily in the pm. Advised to follow-up with nephrology as scheduled.  Home Health:None Equipment/Devices:None  Discharge Condition: Good CODE STATUS:Full code Diet recommendation: Heart Healthy   Brief [facility] Course: This 72 year old female with history of alcohol abuse, tobacco abuse, hypertension, COPD who presented from skilled nursing facility with complaint of generalized weakness, confusion compared to baseline. Lab work at [facility] showed a sodium of 120 and was sent to the emergency department. Sodium of 119 on presentation. UA was suspicious for UTI. Patient was started on IV fluids, ceftriaxone. PT/OT recommending SNF. Hospital course remarkable for persistent hyponatremia, Nephrology consulted due to persistent hyponatremia/drop in Na.  Patient was started on urea 30 mg twice daily in addition to the IV fluid resuscitation.  Continued on Lasix 40 mg daily.  Serum sodium has subsequently improved to 135.  Patient feels better,  insurance authorization approved for nursing home. Patient is being discharged to [facility] for rehabilitation.  Orthopedics has evaluated the patient and recommended follow-up in 2 weeks.  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 -Serum sodium improved  to 124 >127>131 > 135.  Nephrology : anticipated discharge if sodium around 130. -Nephrology  increased urea to 30 mg bid. -Serum sodium has improved to 135.  Nephrology signed off.  Patient is being discharged to SNF.   Hypomagnesemia/ Hypokalemia:  -Supplemented and corrected.   Suspected UTI:  -Complains of some dysuria, urine culture showed multiple cyst.    -She has finished 3 days of antibiotics with ceftriaxone.   History of alcohol use/tobacco use:  Counseled cessation.  Continue thiamine, folic acid.   -Drinks hard liquor every day, smokes half packs a day.   Hypertension: -Continue metoprolol, hydralazine.   -Dose of hydralazine increased during this hospitalization.   H/O SVT:  -Continue metoprolol. -Currently in sinus rhythm.   Hyperlipidemia: On Crestor   Recent history of left knee fracture:  Reviewed last orthopedics note.  She was supposed to be following up with orthopedics. Has fractured patella.  She was recommended NWB on left LE, and was supposed to  follow up with Orthopedics Dr Odis Hollingshead.      Reported Blood in Stool. Hb stable, monitor for now, would recommend outpatient follow up unless BRBPR or melena noted here or drop in Hb/Hct. H/ H stable.   Generalized weakness / deconditioning:  Patient is from SNF.  PT/OT consulted here, recommended SNF.   Apparently has bed at SNF (not yet ready for d/c with unimproved hyponatremia).   Discharge Instructions  Discharge Instructions     Ambulatory Referral for Lung Cancer Scre   Complete by: As directed    Call MD for:  difficulty breathing, headache or visual disturbances   Complete by: As directed    Call MD for:  persistant dizziness or light-headedness   Complete by: As directed    Call MD for:  persistant nausea and vomiting   Complete by: As directed    Diet - low sodium heart healthy   Complete by: As directed    Diet Carb Modified   Complete by: As directed    Discharge instructions    Complete by: As directed    Advised to follow-up with primary care physician in 1 week. Advised to take Lasix 40 mg daily, urea 30 mg twice daily. Advised to follow-up with nephrology as scheduled.   Increase activity slowly   Complete by: As directed       Allergies as of 11/29/2022       Reactions   Dextromethorphan Other (See Comments)   Makes her very angry and she wants to hurt people        Medication List     STOP taking these medications    cephALEXin 500 MG capsule Commonly known as: KEFLEX   losartan 100 MG tablet Commonly known as: COZAAR       TAKE these medications    albuterol 108 (90 Base) MCG/ACT inhaler Commonly known as: VENTOLIN HFA Inhale 1-2 puffs into the lungs every 4 (four) hours as needed for shortness of breath.   aspirin EC 81 MG tablet Take 1 tablet (81 mg total) by mouth daily.   cyanocobalamin 1000 MCG tablet Take 1 tablet (1,000 mcg total) by mouth daily.   folic acid 1 MG tablet Commonly known as: FOLVITE Take 1 tablet (1 mg total) by mouth daily.   furosemide 40 MG tablet Commonly known as: LASIX Take 1 tablet (40 mg total) by mouth daily.   guaiFENesin 100 MG/5ML liquid Commonly known as: ROBITUSSIN Take 10 mLs by mouth every 4 (four) hours as needed for cough or to loosen phlegm. 10 ml by mouth every 4 hours as needed for 48 hours.   hydrALAZINE 25 MG tablet Commonly known as: APRESOLINE Take 25 mg by mouth 3 (three) times daily.   LORazepam 1 MG tablet Commonly known as: ATIVAN Take 1 tablet (1 mg total) by mouth daily for 5 days. For Increased Agitation   magnesium oxide 400 (240 Mg) MG tablet Commonly known as: MAG-OX Take 1 tablet (400 mg total) by mouth daily.   metoprolol succinate 100 MG 24 hr tablet Commonly known as: Toprol XL Take 1 tablet (100 mg total) by mouth daily. Take with or immediately following a meal.   pantoprazole 40 MG tablet Commonly known as: PROTONIX Take 1 tablet (40 mg total) by  mouth daily.   rosuvastatin 10 MG tablet Commonly known as: CRESTOR Take 1 tablet (10 mg total) by mouth daily.   thiamine 100 MG tablet Commonly known as: Vitamin B-1 Take 1 tablet (100 mg total) by mouth daily.   urea 15 g Pack oral packet Commonly known as: URE-NA Take 15 g by mouth daily at 8 pm.   urea 15 g Pack oral packet Commonly known as: URE-NA Take 30 g by mouth daily. Start taking on: November 30, 2022        Contact information for follow-up providers     Netta Cedars, MD. Schedule an appointment as soon as possible for a visit in 1 week(s).   Specialty: Orthopedic Surgery Contact information: [address] [place] [address] [phone]         Norm Salt, [place] Follow up in 1 week(s).   Specialty: Physician Field seismologist information: [address] [place] [address] [phone]  Contact information for after-discharge care     Destination     [facility] Preferred SNF .   Service: Skilled Nursing Contact information: [address] [place] [address] [phone]                    Allergies  Allergen Reactions   Dextromethorphan Other (See Comments)    Makes her very angry and she wants to hurt people    Consultations: Nephrology Orthopeadics   Procedures/Studies: DG Knee 1-2 Views Left  Result Date: 11/26/2022 CLINICAL DATA:  Left knee pain for 1 month. Known left tibial plateau fracture. EXAM: LEFT KNEE - 1-2 VIEW COMPARISON:  Left knee radiographs 11/22/2022, CT left knee 10/23/2022 FINDINGS: There is diffuse decreased bone mineralization. There is again an oblique fracture extending from the lateral cortex of the proximal tibial metaphysis proximally and medially into the medial tibial spine and medial tibial plateau, better seen on prior CT. There is minimal healing sclerosis around these fracture lines, similar to  11/22/2022. There is also sclerosis at the medial tibial plateau where there is a mildly depressed fracture better seen on prior CT. Tiny joint effusion, similar to 11/22/2022 and again decreased from 10/23/2022. Chondrocalcinosis is again seen within the medial and lateral compartments. Moderate atherosclerotic calcifications. IMPRESSION: 1. Healing proximal tibial subacute fractures, similar to 11/22/2022. Fracture lines again remain visible. 2. Tiny joint effusion, similar to 11/22/2022 and again decreased from 10/23/2022. Electronically Signed   By: Neita Garnet M.D.   On: 11/26/2022 12:09   DG Knee Complete 4 Views Left  Result Date: 11/22/2022 CLINICAL DATA:  Left tibial plateau fracture EXAM: LEFT KNEE - COMPLETE 4+ VIEW COMPARISON:  CT 10/23/2022 and radiographs 10/23/2022 FINDINGS: Compared with radiographs 10/23/2022, there is sclerosis in the medial tibial plateau compatible with healing fracture. Fracture lines extend into the tibial spines and lateral tibial metadiaphysis. Similar depression of the medial tibial plateau. Decreased joint effusion, now small. 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 Demineralization. No dislocation. IMPRESSION: Mild healing changes about the medial tibial plateau fracture. Fracture lines remain visible. Electronically Signed   By: Minerva Fester M.D.   On: 11/22/2022 17:40   DG Chest Port 1 View  Result Date: 11/16/2022 CLINICAL DATA:  Weakness EXAM: PORTABLE CHEST 1 VIEW COMPARISON:  08/06/2017 FINDINGS: Chronic scarring at the right base. No acute airspace disease. Stable cardiomediastinal silhouette with aortic atherosclerosis.vague foci of opacity overlying right second anterior rib end as well as the left mid to lower thorax, question healing rib fractures. IMPRESSION: 1. No active disease. Chronic scarring at the right base. 2. Vague foci of bilateral opacity, question healing rib fractures. Consider correlation with chest CT to exclude  lung parenchymal abnormality. Electronically Signed   By: Jasmine Pang M.D.   On: 11/16/2022 17:37   EEG adult  Result Date: 10/31/2022 Charlsie Quest, MD     10/31/2022 11:04 AM Patient Name: MANJIT BUFANO MRN: [id number] Epilepsy Attending: Charlsie Quest Referring Physician/Provider: Marvel Plan, MD Date: 10/31/2022 Duration: 22.55 mins Patient history: 72yo F with ams getting eeg to evaluate for seizure. Level of alertness: Awake AEDs during EEG study: None Technical aspects: This EEG study was done with scalp electrodes positioned according to the 10-20 International system of electrode placement. Electrical activity was reviewed with band pass filter of 1-70Hz , sensitivity of 7 uV/mm, display speed of 50mm/sec with a 60Hz  notched filter applied as appropriate. EEG data were recorded continuously and digitally  stored.  Video monitoring was available and reviewed as appropriate. Description: No clear posterior dominant rhythm was seen. EEG also showed continuous generalized and lateralized right hemisphere mixed frequencies with predominantly 6 to 9 Hz theta-alpha activity admixed with intermittent generalized 2 to 3 Hz delta slowing.  Physiologic photic driving was not seen during photic stimulation. Hyperventilation was not performed.   ABNORMALITY - Continuous slow, generalized and lateralized right hemisphere IMPRESSION: This study is suggestive of cortical dysfunction arising from right hemisphere likely secondary to underlying structural abnormality. Additionally there is moderate diffuse encephalopathy. No seizures or epileptiform discharges were seen throughout the recording. Charlsie Quest   ECHOCARDIOGRAM COMPLETE  Result Date: 10/30/2022    ECHOCARDIOGRAM REPORT   Patient Name:   SHACARRA CHOE Meeuwsen Date of Exam: 10/30/2022 Medical Rec #:  [id number]         Height:       68.0 in Accession #:    [id number]        Weight:       140.0 lb Date of Birth:  May 17, 1950        BSA:          1.756  m Patient Age:    66 years          BP:           150/84 mmHg Patient Gender: F                 HR:           80 bpm. Exam Location:  Inpatient Procedure: 2D Echo, Color Doppler and Cardiac Doppler Indications:    CVA  History:        Patient has prior history of Echocardiogram examinations. COPD;                 Risk Factors:Hypertension and Current Smoker. Sepsis. ETOH                 abuse.  Sonographer:    Aron Baba Referring Phys: [phone] RALPH A NETTEY IMPRESSIONS  1. Left ventricular ejection fraction, by estimation, is 60 to 65%. The left ventricle has normal function. The left ventricle has no regional wall motion abnormalities. Left ventricular diastolic parameters are consistent with Grade I diastolic dysfunction (impaired relaxation).  2. Right ventricular systolic function is normal. The right ventricular size is normal.  3. Left atrial size was mildly dilated.  4. The mitral valve is normal in structure. No evidence of mitral valve regurgitation. No evidence of mitral stenosis.  5. The aortic valve is normal in structure. Aortic valve regurgitation is mild to moderate. No aortic stenosis is present.  6. The inferior vena cava is normal in size with greater than 50% respiratory variability, suggesting right atrial pressure of 3 mmHg. FINDINGS  Left Ventricle: Left ventricular ejection fraction, by estimation, is 60 to 65%. The left ventricle has normal function. The left ventricle has no regional wall motion abnormalities. The left ventricular internal cavity size was normal in size. There is  no left ventricular hypertrophy. Left ventricular diastolic parameters are consistent with Grade I diastolic dysfunction (impaired relaxation). Right Ventricle: The right ventricular size is normal. No increase in right ventricular wall thickness. Right ventricular systolic function is normal. Left Atrium: Left atrial size was mildly dilated. Right Atrium: Right atrial size was normal in size. Pericardium: There  is no evidence of pericardial effusion. Mitral Valve: The mitral valve is normal in structure. No evidence of mitral valve regurgitation.  No evidence of mitral valve stenosis. Tricuspid Valve: The tricuspid valve is normal in structure. Tricuspid valve regurgitation is mild . No evidence of tricuspid stenosis. Aortic Valve: The aortic valve is normal in structure. Aortic valve regurgitation is mild to moderate. Aortic regurgitation PHT measures 677 msec. No aortic stenosis is present. Pulmonic Valve: The pulmonic valve was normal in structure. Pulmonic valve regurgitation is not visualized. No evidence of pulmonic stenosis. Aorta: The aortic root is normal in size and structure. Venous: The inferior vena cava is normal in size with greater than 50% respiratory variability, suggesting right atrial pressure of 3 mmHg. IAS/Shunts: No atrial level shunt detected by color flow Doppler.  LEFT VENTRICLE PLAX 2D LVIDd:         4.40 cm   Diastology LVIDs:         2.90 cm   LV e' medial:    5.33 cm/s LV PW:         0.60 cm   LV E/e' medial:  13.7 LV IVS:        0.90 cm   LV e' lateral:   8.59 cm/s LVOT diam:     1.90 cm   LV E/e' lateral: 8.5 LV SV:         71 LV SV Index:   41 LVOT Area:     2.84 cm  RIGHT VENTRICLE RV S prime:     15.40 cm/s TAPSE (M-mode): 2.4 cm LEFT ATRIUM             Index        RIGHT ATRIUM           Index LA Vol (A2C):   74.8 ml 42.59 ml/m  RA Area:     12.70 cm LA Vol (A4C):   56.0 ml 31.89 ml/m  RA Volume:   26.00 ml  14.80 ml/m LA Biplane Vol: 70.7 ml 40.26 ml/m  AORTIC VALVE             PULMONIC VALVE LVOT Vmax:   129.00 cm/s PR End Diast Vel: 11.16 msec LVOT Vmean:  83.300 cm/s LVOT VTI:    0.252 m AI PHT:      677 msec  AORTA Ao Root diam: 3.50 cm Ao Asc diam:  3.20 cm MITRAL VALVE                TRICUSPID VALVE MV Area (PHT): 3.77 cm     TR Peak grad:   26.8 mmHg MV Decel Time: 201 msec     TR Vmax:        259.00 cm/s MV E velocity: 73.20 cm/s MV A velocity: 148.00 cm/s  SHUNTS MV E/A  ratio:  0.49         Systemic VTI:  0.25 m                             Systemic Diam: 1.90 cm Kardie Tobb DO Electronically signed by Thomasene Ripple DO Signature Date/Time: 10/30/2022/3:20:07 PM    Final      Subjective: Patient was seen and examined at bedside.  Overnight events noted. Patient reports doing much better.   Sodium has improved. She wants to be discharged.  Patient is being discharged to [facility].  Discharge Exam: Vitals:   11/28/22 2023 11/29/22 0335  BP: 114/68 137/71  Pulse: 66 60  Resp: 16 16  Temp: 98.4 F (36.9 C) 97.8 F (36.6 C)  SpO2:  92% 93%   Vitals:   11/28/22 0746 11/28/22 1508 11/28/22 2023 11/29/22 0335  BP: (!) 166/81 (!) 167/76 114/68 137/71  Pulse: 76 84 66 60  Resp: 16 16 16 16   Temp: 98.1 F (36.7 C) 98.7 F (37.1 C) 98.4 F (36.9 C) 97.8 F (36.6 C)  TempSrc: Oral Axillary Oral Oral  SpO2: 91% 96% 92% 93%  Weight:      Height:        General: Pt is alert, awake, not in acute distress Cardiovascular: RRR, S1/S2 +, no rubs, no gallops Respiratory: CTA bilaterally, no wheezing, no rhonchi Abdominal: Soft, NT, ND, bowel sounds + Extremities: no edema, no cyanosis    The results of significant diagnostics from this hospitalization (including imaging, microbiology, ancillary and laboratory) are listed below for reference.     Microbiology: No results found for this or any previous visit (from the past 240 hour(s)).   Labs: BNP (last 3 results) No results for input(s): "BNP" in the last 8760 hours. Basic Metabolic Panel: Recent Labs  Lab 11/23/22 1223 11/24/22 0844 11/25/22 0644 11/26/22 0407 11/26/22 1333 11/27/22 0015 11/27/22 1438 11/28/22 0324 11/29/22 0005  NA 123*   < > 127* 124* 124* 127* 131* 135 133*  K 3.8   < > 4.0 3.8 4.3 3.8 4.0 3.7 3.2*  CL 87*   < > 88* 86* 88* 88* 89* 86* 90*  CO2 29   < > 30 27 27 29  34* 34* 33*  GLUCOSE 95   < > 102* 102* 121* 115* 114* 118* 108*  BUN 38*   < > 27* 26* 55* 109* 118*  131* 85*  CREATININE 0.66   < > 0.56 0.66 0.64 0.80 0.87 0.88 0.96  CALCIUM 9.0   < > 9.3 9.1 9.1 9.1 9.5 10.1 9.7  MG 1.7  --  1.9 1.9  --   --   --   --   --   PHOS 4.3  --  3.9 4.4  --   --   --   --   --    < > = values in this interval not displayed.   Liver Function Tests: Recent Labs  Lab 11/23/22 1223  AST 22  ALT 26  ALKPHOS 45  BILITOT 0.2*  PROT 6.1*  ALBUMIN 3.3*   No results for input(s): "LIPASE", "AMYLASE" in the last 168 hours. No results for input(s): "AMMONIA" in the last 168 hours. CBC: Recent Labs  Lab 11/22/22 1706 11/23/22 1223 11/25/22 0644 11/26/22 0407 11/27/22 0015  WBC  --  7.2 8.0 7.6 9.9  NEUTROABS  --  5.4  --   --   --   HGB 12.3 12.1 11.4* 11.2* 10.8*  HCT 35.8* 35.7* 32.8* 32.8* 31.6*  MCV  --  96.0 97.3 96.2 98.1  PLT  --  286 241 274 270   Cardiac Enzymes: No results for input(s): "CKTOTAL", "CKMB", "CKMBINDEX", "TROPONINI" in the last 168 hours. BNP: Invalid input(s): "POCBNP" CBG: Recent Labs  Lab 11/26/22 1653  GLUCAP 127*   D-Dimer No results for input(s): "DDIMER" in the last 72 hours. Hgb A1c No results for input(s): "HGBA1C" in the last 72 hours. Lipid Profile No results for input(s): "CHOL", "HDL", "LDLCALC", "TRIG", "CHOLHDL", "LDLDIRECT" in the last 72 hours. Thyroid function studies No results for input(s): "TSH", "T4TOTAL", "T3FREE", "THYROIDAB" in the last 72 hours.  Invalid input(s): "FREET3" Anemia work up No results for input(s): "VITAMINB12", "FOLATE", "FERRITIN", "TIBC", "IRON", "RETICCTPCT" in the  last 72 hours. Urinalysis    Component Value Date/Time   COLORURINE AMBER (A) 11/21/2022 0351   APPEARANCEUR CLEAR 11/21/2022 0351   LABSPEC 1.015 11/21/2022 0351   PHURINE 5.0 11/21/2022 0351   GLUCOSEU NEGATIVE 11/21/2022 0351   HGBUR NEGATIVE 11/21/2022 0351   BILIRUBINUR NEGATIVE 11/21/2022 0351   BILIRUBINUR neg 10/11/2011 1425   KETONESUR NEGATIVE 11/21/2022 0351   PROTEINUR NEGATIVE 11/21/2022  0351   UROBILINOGEN 0.2 10/11/2011 1425   UROBILINOGEN 0.2 01/24/2008 2217   NITRITE POSITIVE (A) 11/21/2022 0351   LEUKOCYTESUR NEGATIVE 11/21/2022 0351   Sepsis Labs Recent Labs  Lab 11/23/22 1223 11/25/22 0644 11/26/22 0407 11/27/22 0015  WBC 7.2 8.0 7.6 9.9   Microbiology No results found for this or any previous visit (from the past 240 hour(s)).   Time coordinating discharge: Over 30 minutes  SIGNED:   Willeen Niece, MD  [facility] 11/29/2022, 10:07 AM Pager   If 7PM-7AM, please contact night-coverage

## 2022-11-28 NOTE — TOC Progression Note (Signed)
Transition of Care [facility]) - Progression Note    Patient Details  Name: Erica Ball MRN: [id number] Date of Birth: 09-11-1950  Transition of Care [facility]) CM/SW Contact  Delilah Shan, LCSWA Phone Number: 11/28/2022, 8:44 AM  Clinical Narrative:     CSW received insurance approval for patient. Plan Auth ID# [id number] Auth ID# [id number]. Insurance authorization has been approved from 7/24-7/26. Patient has SNF bed at [facility] when medically ready. CSW will continue to follow.  Expected Discharge Plan: Skilled Nursing Facility Barriers to Discharge: Continued Medical Work up  Expected Discharge Plan and Services In-house Referral: Clinical Social Work   Post Acute Care Choice:  (pt wants to talk to family about DC home) Living arrangements for the past 2 months: Single Family Home                                       Social Determinants of Health (SDOH) Interventions SDOH Screenings   Food Insecurity: No Food Insecurity (11/17/2022)  Housing: Low Risk  (11/17/2022)  Transportation Needs: No Transportation Needs (11/17/2022)  Utilities: Not At Risk (11/17/2022)  Alcohol Screen: Low Risk  (11/17/2022)  Depression (PHQ2-9): Low Risk  (11/17/2022)  Financial Resource Strain: Patient Unable To Answer (11/17/2022)  Physical Activity: Sufficiently Active (11/17/2022)  Social Connections: Socially Isolated (11/17/2022)  Stress: No Stress Concern Present (11/17/2022)  Tobacco Use: High Risk (11/16/2022)  Health Literacy: Inadequate Health Literacy (11/17/2022)    Readmission Risk Interventions     No data to display

## 2022-11-28 NOTE — Discharge Instructions (Addendum)
Advised to follow-up with primary care physician in 1 week. Advised to follow-up with orthopedics Dr. Netta Cedars is a scheduled. Advised to take Lasix 40 mg daily, urea 30 mg twice daily. Advised to follow-up with nephrology as scheduled.

## 2022-11-28 NOTE — Progress Notes (Signed)
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  Subjective:  Sna now normal at 135 Delirious this morning  07/23 0701 - 07/24 0700 In: 1547 [P.O.:1547] Out: 650 [Urine:650]  Filed Weights   11/17/22 0100 11/23/22 1434  Weight: 55.7 kg 58.5 kg    Scheduled Meds:  vitamin B-12  1,000 mcg Oral Daily   enoxaparin (LOVENOX) injection  40 mg Subcutaneous Q24H   feeding supplement  237 mL Oral TID BM   folic acid  1 mg Oral Daily   furosemide  40 mg Oral Daily   magnesium oxide  400 mg Oral Daily   metoprolol succinate  100 mg Oral Daily   multivitamin with minerals  1 tablet Oral Daily   pantoprazole  40 mg Oral Daily   pneumococcal 20-valent conjugate vaccine  0.5 mL Intramuscular Tomorrow-1000   rosuvastatin  10 mg Oral Daily   [START ON 11/30/2022] thiamine  100 mg Oral Daily   urea  15 g Oral BID   Continuous Infusions:  thiamine (VITAMIN B1) injection 250 mg (11/27/22 0927)   PRN Meds:.acetaminophen, albuterol, labetalol, melatonin, mouth rinse, polyethylene glycol, prochlorperazine  Current Labs: reviewed   Latest Reference Range & Units 11/20/22 02:46  Osmolality, Urine 300 - 900 mOsm/kg 470  Sodium, Urine mmol/L 45    Physical Exam:  Blood pressure (!) 166/81, pulse 76, temperature 98.1 F (36.7 C), temperature source Oral, resp. rate 16, height 5\' 8"  (1.727 m), weight 58.5 kg, SpO2 91%. lying in bed in nad RRR, no rub CTAB, no iwob No LEE, warm and well perfused Nonfocal, confused  A Hyponatremia now most c/w SIADH but also likely some hypevolemia AMS, not related to #1, likely hospital delirium, per TRH HTN  P Decrease urea to 15 g twice daily Decrease Lasix to 40 mg daily Recommend repeating BMP on Friday in the outpatient setting BP improved with diuresis; can restart hydralazine needed  Given the patient's improvement we will sign off at this time.  We will arrange outpatient hospital follow-up in the next 2 to  3 weeks.  Outpatient providers can decrease urea as needed if Na improves further  [name]  11/28/2022, 11:20 AM  Recent Labs  Lab 11/23/22 1223 11/24/22 0844 11/25/22 0644 11/26/22 0407 11/26/22 1333 11/27/22 0015 11/27/22 1438 11/28/22 0324  NA 123*   < > 127* 124*   < > 127* 131* 135  K 3.8   < > 4.0 3.8   < > 3.8 4.0 3.7  CL 87*   < > 88* 86*   < > 88* 89* 86*  CO2 29   < > 30 27   < > 29 34* 34*  GLUCOSE 95   < > 102* 102*   < > 115* 114* 118*  BUN 38*   < > 27* 26*   < > 109* 118* 131*  CREATININE 0.66   < > 0.56 0.66   < > 0.80 0.87 0.88  CALCIUM 9.0   < > 9.3 9.1   < > 9.1 9.5 10.1  PHOS 4.3  --  3.9 4.4  --   --   --   --    < > = values in this interval not displayed.   Recent Labs  Lab 11/23/22 1223 11/25/22 0644 11/26/22 0407 11/27/22 0015  WBC 7.2 8.0 7.6 9.9  NEUTROABS 5.4  --   --   --   HGB 12.1 11.4* 11.2* 10.8*  HCT 35.7* 32.8* 32.8* 31.6*  MCV 96.0 97.3 96.2 98.1  PLT 286 241 274 270

## 2022-11-28 NOTE — Plan of Care (Signed)
  Problem: Education: °Goal: Knowledge of General Education information will improve °Description: Including pain rating scale, medication(s)/side effects and non-pharmacologic comfort measures °Outcome: Progressing °  °Problem: Health Behavior/Discharge Planning: °Goal: Ability to manage health-related needs will improve °Outcome: Progressing °  °Problem: Clinical Measurements: °Goal: Ability to maintain clinical measurements within normal limits will improve °Outcome: Progressing °Goal: Will remain free from infection °Outcome: Progressing °Goal: Diagnostic test results will improve °Outcome: Progressing °Goal: Respiratory complications will improve °Outcome: Progressing °Goal: Cardiovascular complication will be avoided °Outcome: Progressing °  °Problem: Nutrition: °Goal: Adequate nutrition will be maintained °Outcome: Progressing °  °Problem: Safety: °Goal: Ability to remain free from injury will improve °Outcome: Progressing °  °Problem: Skin Integrity: °Goal: Risk for impaired skin integrity will decrease °Outcome: Progressing °  °

## 2022-11-29 DIAGNOSIS — E871 Hypo-osmolality and hyponatremia: Secondary | ICD-10-CM | POA: Diagnosis not present

## 2022-11-29 LAB — BASIC METABOLIC PANEL
Anion gap: 10 (ref 5–15)
BUN: 85 mg/dL — ABNORMAL HIGH (ref 8–23)
CO2: 33 mmol/L — ABNORMAL HIGH (ref 22–32)
Calcium: 9.7 mg/dL (ref 8.9–10.3)
Creatinine, Ser: 0.96 mg/dL (ref 0.44–1.00)
Glucose, Bld: 108 mg/dL — ABNORMAL HIGH (ref 70–99)
Potassium: 3.2 mmol/L — ABNORMAL LOW (ref 3.5–5.1)
Sodium: 133 mmol/L — ABNORMAL LOW (ref 135–145)

## 2022-11-29 MED ORDER — UREA 15 G PO PACK: 15.0000 g | PACK | Freq: Every day | ORAL | 0 refills | Status: DC

## 2022-11-29 MED ORDER — UREA 15 G PO PACK: 30.0000 g | PACK | Freq: Every day | ORAL | 1 refills | Status: DC

## 2022-11-29 MED ORDER — POTASSIUM CHLORIDE CRYS ER 20 MEQ PO TBCR
20.0000 meq | EXTENDED_RELEASE_TABLET | Freq: Every day | ORAL | Status: DC
  Administered 2022-11-29: 20 meq via ORAL
  Filled 2022-11-29: qty 1

## 2022-11-29 MED ORDER — UREA 15 G PO PACK
30.0000 g | PACK | Freq: Every day | ORAL | Status: DC
  Administered 2022-11-29: 30 g via ORAL
  Filled 2022-11-29: qty 2

## 2022-11-29 MED ORDER — POTASSIUM CHLORIDE CRYS ER 20 MEQ PO TBCR: 20.0000 meq | EXTENDED_RELEASE_TABLET | Freq: Every day | ORAL | 1 refills | Status: DC

## 2022-11-29 MED ORDER — UREA 15 G PO PACK
15.0000 g | PACK | Freq: Every day | ORAL | Status: DC
  Filled 2022-11-29: qty 1

## 2022-11-29 MED ORDER — POTASSIUM CHLORIDE 20 MEQ PO PACK: 40.0000 meq | PACK | Freq: Once | ORAL | Status: DC

## 2022-11-29 NOTE — Progress Notes (Signed)
PROGRESS NOTE    Erica Ball  BJY:[id number] DOB: [birth year] DOA: 11/16/2022 PCP: Norm Salt, PA   Brief Narrative: This 72 year old female with history of alcohol abuse, tobacco abuse, hypertension, COPD who presented from skilled nursing facility with complaint of generalized weakness, confusion compared to baseline. Lab work at [facility] showed a sodium of 120 and was sent to the emergency department. Sodium of 119 on presentation. UA was suspicious for UTI. Patient was started on IV fluids, ceftriaxone. PT/OT recommending SNF. Hospital course remarkable for persistent hyponatremia, Nephrology consulted due to persistent hyponatremia/drop in Na.  Patient was started on urea 30 mg twice daily in addition to the IV fluid resuscitation.  Continued on Lasix 40 mg daily.  Serum sodium has subsequently improved to 135.  Patient feels better,  insurance authorization approved for nursing home. Patient is being discharged to [facility] for rehabilitation.  Orthopedics has evaluated the patient and recommended follow-up in 2 weeks.   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 -Serum sodium improved to 124 >127>131 > 135> 133.   -Nephrology : anticipated discharge if sodium around 130. -Nephrology continue urea 30 mg in the morning and 15 mg in the night. -Serum sodium has improved to 133.  Nephrology signed off.  Patient is being discharged to SNF.   Hypomagnesemia/ Hypokalemia:  -Supplemented and corrected.   Suspected UTI:  -Complains of some dysuria, urine culture showed multiple cyst.    -She has finished 3 days of antibiotics with ceftriaxone.   History of alcohol use/tobacco use:  Counseled cessation.  Continue thiamine, folic acid.   -Drinks hard liquor every day, smokes half packs a day.    Hypertension: -Continue metoprolol, hydralazine.   -Dose of hydralazine increased during this hospitalization.   H/O SVT:  -Continue metoprolol. -Currently in sinus rhythm.   Hyperlipidemia: On Crestor   Recent history of left knee fracture:  Reviewed last orthopedics note.  She was supposed to be following up with orthopedics. Has fractured patella.  She was recommended NWB on left LE, and was supposed to  follow up with Orthopedics Dr Odis Hollingshead.     Reported Blood in Stool. Hb stable, monitor for now, would recommend outpatient follow up unless BRBPR or melena noted here or drop in Hb/Hct. H/ H stable.   Generalized weakness / deconditioning:  Patient is from SNF.  PT/OT consulted here, recommended SNF.    Apparently has bed at SNF (not yet ready for d/c with unimproved hyponatremia).   DVT prophylaxis: Lovenox Code Status: Full code Family Communication: Spoke with Son on the phone. Disposition Plan:   Status is: Inpatient Remains inpatient appropriate because: DC to [facility] today.    Consultants:  Nephrology  Procedures: None  Antimicrobials: Anti-infectives (From admission, onward)    Start     Dose/Rate Route Frequency Ordered Stop   11/17/22 1000  cefTRIAXone (ROCEPHIN) 2 g in sodium chloride 0.9 % 100 mL IVPB        2 g 200 mL/hr over 30 Minutes Intravenous Every 24 hours 11/16/22 2014 11/19/22 1336   11/16/22 2015  cefTRIAXone (ROCEPHIN) 1 g in sodium chloride 0.9 % 100 mL IVPB        1 g 200 mL/hr over 30 Minutes Intravenous  Once 11/16/22 2001 11/16/22 2043      Subjective: Patient was seen and examined  at bedside.  Overnight events noted.   Patient reports doing much better and wants to be discharged.  Sodium is 133.   Discussed with son and he agreed with the discharge to nursing home today.  Objective: Vitals:   11/28/22 0746 11/28/22 1508 11/28/22 2023 11/29/22 0335  BP: (!) 166/81 (!) 167/76 114/68 137/71  Pulse: 76 84 66 60  Resp: 16  16 16 16   Temp: 98.1 F (36.7 C) 98.7 F (37.1 C) 98.4 F (36.9 C) 97.8 F (36.6 C)  TempSrc: Oral Axillary Oral Oral  SpO2: 91% 96% 92% 93%  Weight:      Height:        Intake/Output Summary (Last 24 hours) at 11/29/2022 1137 Last data filed at 11/28/2022 2130 Gross per 24 hour  Intake 457 ml  Output --  Net 457 ml   Filed Weights   11/17/22 0100 11/23/22 1434  Weight: 55.7 kg 58.5 kg    Examination:  General exam: Appears calm and comfortable  Respiratory system: Clear to auscultation. Respiratory effort normal. Cardiovascular system: S1 & S2 heard, RRR. No JVD, murmurs, rubs, gallops or clicks. No pedal edema. Gastrointestinal system: Abdomen is nondistended, soft and nontender. No organomegaly or masses felt. Normal bowel sounds heard. Central nervous system: Alert and oriented. No focal neurological deficits. Extremities: Symmetric 5 x 5 power. Skin: No rashes, lesions or ulcers Psychiatry: Judgement and insight appear normal. Mood & affect appropriate.     Data Reviewed: I have personally reviewed following labs and imaging studies  CBC: Recent Labs  Lab 11/22/22 1706 11/23/22 1223 11/25/22 0644 11/26/22 0407 11/27/22 0015  WBC  --  7.2 8.0 7.6 9.9  NEUTROABS  --  5.4  --   --   --   HGB 12.3 12.1 11.4* 11.2* 10.8*  HCT 35.8* 35.7* 32.8* 32.8* 31.6*  MCV  --  96.0 97.3 96.2 98.1  PLT  --  286 241 274 270   Basic Metabolic Panel: Recent Labs  Lab 11/23/22 1223 11/24/22 0844 11/25/22 0644 11/26/22 0407 11/26/22 1333 11/27/22 0015 11/27/22 1438 11/28/22 0324 11/29/22 0005  NA 123*   < > 127* 124* 124* 127* 131* 135 133*  K 3.8   < > 4.0 3.8 4.3 3.8 4.0 3.7 3.2*  CL 87*   < > 88* 86* 88* 88* 89* 86* 90*  CO2 29   < > 30 27 27 29  34* 34* 33*  GLUCOSE 95   < > 102* 102* 121* 115* 114* 118* 108*  BUN 38*   < > 27* 26* 55* 109* 118* 131* 85*  CREATININE 0.66   < > 0.56 0.66 0.64 0.80 0.87 0.88 0.96  CALCIUM 9.0   < > 9.3 9.1 9.1 9.1 9.5 10.1 9.7   MG 1.7  --  1.9 1.9  --   --   --   --   --   PHOS 4.3  --  3.9 4.4  --   --   --   --   --    < > = values in this interval not displayed.   GFR: Estimated Creatinine Clearance: 48.2 mL/min (by C-G formula based on SCr of 0.96 mg/dL). Liver Function Tests: Recent Labs  Lab 11/23/22 1223  AST 22  ALT 26  ALKPHOS 45  BILITOT 0.2*  PROT 6.1*  ALBUMIN 3.3*   No results for input(s): "LIPASE", "AMYLASE" in the last 168 hours. No results for input(s): "AMMONIA" in the last 168 hours. Coagulation Profile:  No results for input(s): "INR", "PROTIME" in the last 168 hours. Cardiac Enzymes: No results for input(s): "CKTOTAL", "CKMB", "CKMBINDEX", "TROPONINI" in the last 168 hours. BNP (last 3 results) No results for input(s): "PROBNP" in the last 8760 hours. HbA1C: No results for input(s): "HGBA1C" in the last 72 hours. CBG: Recent Labs  Lab 11/26/22 1653  GLUCAP 127*   Lipid Profile: No results for input(s): "CHOL", "HDL", "LDLCALC", "TRIG", "CHOLHDL", "LDLDIRECT" in the last 72 hours. Thyroid Function Tests: No results for input(s): "TSH", "T4TOTAL", "FREET4", "T3FREE", "THYROIDAB" in the last 72 hours. Anemia Panel: No results for input(s): "VITAMINB12", "FOLATE", "FERRITIN", "TIBC", "IRON", "RETICCTPCT" in the last 72 hours. Sepsis Labs: No results for input(s): "PROCALCITON", "LATICACIDVEN" in the last 168 hours.  No results found for this or any previous visit (from the past 240 hour(s)).   Radiology Studies: No results found.  Scheduled Meds:  vitamin B-12  1,000 mcg Oral Daily   enoxaparin (LOVENOX) injection  40 mg Subcutaneous Q24H   feeding supplement  237 mL Oral TID BM   folic acid  1 mg Oral Daily   furosemide  40 mg Oral Daily   magnesium oxide  400 mg Oral Daily   metoprolol succinate  100 mg Oral Daily   multivitamin with minerals  1 tablet Oral Daily   pantoprazole  40 mg Oral Daily   pneumococcal 20-valent conjugate vaccine  0.5 mL Intramuscular  Tomorrow-1000   potassium chloride  40 mEq Oral Once   potassium chloride  20 mEq Oral Daily   rosuvastatin  10 mg Oral Daily   [START ON 11/30/2022] thiamine  100 mg Oral Daily   urea  15 g Oral Q2000   urea  30 g Oral Daily   Continuous Infusions:  thiamine (VITAMIN B1) injection 250 mg (11/27/22 0927)     LOS: 13 days    Time spent: 35 mins    Willeen Niece, MD [facility]   If 7PM-7AM, please contact night-coverage

## 2022-11-29 NOTE — TOC Transition Note (Signed)
Transition of Care [facility]) - CM/SW Discharge Note   Patient Details  Name: Erica Ball MRN: [id number] Date of Birth: [birth year]  Transition of [facility]) CM/SW Contact:  Erin Sons, LCSW Phone Number: 11/29/2022, 10:20 AM   Clinical Narrative:     Patient will DC to: [facility]   Anticipated DC date: 11/28/2022   Family notified: Fayrene Fearing   Transport by: Sharin Mons   ?   Per MD patient ready for DC to [facility] . RN, patient, patient's family, and facility notified of DC. Discharge Summary sent to facility. RN given number for report tele#[phone] RM# [phone] . DC packet on chart. Ambulance transport requested for patient.   CSW signing off.   Final next level of care: Skilled Nursing Facility Barriers to Discharge: No Barriers Identified   Patient Goals and CMS Choice   Choice offered to / list presented to : Patient, Adult Children  Discharge Placement                Patient chooses bed at: [facility] Patient to be transferred to facility by: [facility] Name of family member notified: Fayrene Fearing Patient and family notified of of transfer: 11/28/22  Discharge Plan and Services Additional resources added to the After Visit Summary for   In-house Referral: Clinical Social Work   Post Acute Care Choice:  (pt wants to talk to family about DC home)                               Social Determinants of Health (SDOH) Interventions SDOH Screenings   Food Insecurity: No Food Insecurity (11/17/2022)  Housing: Low Risk  (11/17/2022)  Transportation Needs: No Transportation Needs (11/17/2022)  Utilities: Not At Risk (11/17/2022)  Alcohol Screen: Low Risk  (11/17/2022)  Depression (PHQ2-9): Low Risk  (11/17/2022)  Financial Resource Strain: Patient Unable To Answer (11/17/2022)  Physical Activity: Sufficiently Active (11/17/2022)  Social Connections: Socially Isolated (11/17/2022)  Stress: No Stress Concern Present (11/17/2022)   Tobacco Use: High Risk (11/16/2022)  Health Literacy: Inadequate Health Literacy (11/17/2022)     Readmission Risk Interventions     No data to display

## 2022-11-29 NOTE — Progress Notes (Signed)
Occupational Therapy Treatment Patient Details Name: Erica Ball MRN: [id number] DOB: 11-13-1950 Today's Date: 11/29/2022   History of present illness 72 year old female admitted 7/12 from SNF with hyponatremia, Hypomagnesemia/hypokalemia, and suspected UTI. Recent admission 6/18 after fall, found to have left tibial plateau fracture. PMHx: alcohol abuse, tobacco abuse, HTN, COPD, mobility issues   OT comments  Pt progressing towards goals, adhering to WB precautions with therapist's foot placed behind pt's foot. Requesting to transfer to [facility] upon arrival. Pt needing min -mod A for seated ADLs, mod A for step pivot transfer x2 during session and min guard for bed mobility. Pt with decreased cognition, needs min cues for safety during transfers and able to locate date on wall with min cues from therapist. Pt presenting with impairments listed below, will follow acutely. Patient will benefit from continued inpatient follow up therapy, <3 hours/day to maximize safety/ind with ADLs/functional mobility.    Recommendations for follow up therapy are one component of a multi-disciplinary discharge planning process, led by the attending physician.  Recommendations may be updated based on patient status, additional functional criteria and insurance authorization.    Assistance Recommended at Discharge Frequent or constant Supervision/Assistance  Patient can return home with the following  A lot of help with walking and/or transfers;A lot of help with bathing/dressing/bathroom;Assistance with cooking/housework;Direct supervision/assist for medications management;Direct supervision/assist for financial management;Assist for transportation;Help with stairs or ramp for entrance   Equipment Recommendations  Other (comment) (defer)    Recommendations for Other Services PT consult    Precautions / Restrictions Precautions Precautions: Fall Required Braces or Orthoses: Knee Immobilizer - Left Knee  Immobilizer - Left: On at all times Restrictions Weight Bearing Restrictions: Yes RLE Weight Bearing: Weight bearing as tolerated LLE Weight Bearing: Non weight bearing       Mobility Bed Mobility Overal bed mobility: Needs Assistance Bed Mobility: Supine to Sit Rolling: Min guard   Supine to sit: Min guard, HOB elevated          Transfers Overall transfer level: Needs assistance Equipment used: Rolling walker (2 wheels) Transfers: Sit to/from Stand, Bed to chair/wheelchair/BSC Sit to Stand: Mod assist Stand pivot transfers: Mod assist         General transfer comment: mod  A for 2 pivot transfers with RW     Balance Overall balance assessment: Needs assistance, History of Falls Sitting-balance support: No upper extremity supported, Feet supported Sitting balance-Leahy Scale: Fair Sitting balance - Comments: sitting EOB   Standing balance support: During functional activity, Bilateral upper extremity supported, Reliant on assistive device for balance Standing balance-Leahy Scale: Poor Standing balance comment: reliant on RW for support                           ADL either performed or assessed with clinical judgement   ADL Overall ADL's : Needs assistance/impaired         Upper Body Bathing: Sitting;Minimal assistance Upper Body Bathing Details (indicate cue type and reason): min A to wash back Lower Body Bathing: Moderate assistance;Sitting/lateral leans Lower Body Bathing Details (indicate cue type and reason): pt able to wash thighs in sitting, therapist washing below knee Upper Body Dressing : Minimal assistance;Sitting       Toilet Transfer: Moderate assistance;Minimal assistance;Stand-pivot;BSC/3in1;Rolling walker (2 wheels) Toilet Transfer Details (indicate cue type and reason): benefits from therapist's foot under pt's foot Toileting- Clothing Manipulation and Hygiene: Supervision/safety Toileting - Clothing Manipulation Details  (indicate cue type and reason):  pericare seated on commode     Functional mobility during ADLs: Minimal assistance;Moderate assistance;Rolling walker (2 wheels)      Extremity/Trunk Assessment Upper Extremity Assessment Upper Extremity Assessment: Generalized weakness   Lower Extremity Assessment Lower Extremity Assessment: Defer to PT evaluation        Vision   Vision Assessment?: No apparent visual deficits   Perception Perception Perception: Not tested   Praxis Praxis Praxis: Not tested    Cognition Arousal/Alertness: Awake/alert Behavior During Therapy: Restless, WFL for tasks assessed/performed Overall Cognitive Status: Impaired/Different from baseline Area of Impairment: Memory, Following commands, Safety/judgement, Problem solving, Awareness                 Orientation Level: Disoriented to, Time, Situation   Memory: Decreased short-term memory Following Commands: Follows one step commands with increased time Safety/Judgement: Decreased awareness of safety Awareness: Anticipatory Problem Solving: Difficulty sequencing, Requires verbal cues, Slow processing, Decreased initiation General Comments: follows commands with increased time, states she is unaware of date but able to locate date on wall with min cues        Exercises      Shoulder Instructions       General Comments VSS on RA    Pertinent Vitals/ Pain       Pain Assessment Pain Assessment: No/denies pain  Home Living                                          Prior Functioning/Environment              Frequency  Min 1X/week        Progress Toward Goals  OT Goals(current goals can now be found in the care plan section)  Progress towards OT goals: Progressing toward goals  Acute Rehab OT Goals Patient Stated Goal: none stated OT Goal Formulation: With patient Time For Goal Achievement: 12/02/22 Potential to Achieve Goals: Good ADL Goals Pt Will  Perform Grooming: sitting;with min guard assist Pt Will Perform Upper Body Dressing: with supervision;sitting Pt Will Perform Lower Body Dressing: with supervision;with adaptive equipment;sitting/lateral leans;sit to/from stand Pt Will Transfer to Toilet: with supervision;squat pivot transfer;stand pivot transfer;bedside commode Pt/caregiver will Perform Home Exercise Program: Both right and left upper extremity;With written HEP provided;Increased strength;Increased ROM;With Supervision Additional ADL Goal #1: pt will perform bed mobility mod I in prep for ADLs  Plan Discharge plan remains appropriate;Frequency remains appropriate    Co-evaluation                 AM-PAC OT "6 Clicks" Daily Activity     Outcome Measure   Help from another person eating meals?: A Little Help from another person taking care of personal grooming?: A Little Help from another person toileting, which includes using toliet, bedpan, or urinal?: A Little Help from another person bathing (including washing, rinsing, drying)?: A Lot Help from another person to put on and taking off regular upper body clothing?: A Little Help from another person to put on and taking off regular lower body clothing?: A Lot 6 Click Score: 16    End of Session Equipment Utilized During Treatment: Gait belt;Rolling walker (2 wheels);Left knee immobilizer  OT Visit Diagnosis: Unsteadiness on feet (R26.81);Other abnormalities of gait and mobility (R26.89);History of falling (Z91.81);Muscle weakness (generalized) (M62.81) Pain - Right/Left: Left Pain - part of body: Knee   Activity Tolerance Patient tolerated treatment well  Patient Left in chair;with call bell/phone within reach;with chair alarm set   Nurse Communication Mobility status        Time: 1610-9604 OT Time Calculation (min): 21 min  Charges: OT General Charges $OT Visit: 1 Visit OT Treatments $Self Care/Home Management : 8-22 mins  Carver Fila, OTD,  OTR/L SecureChat Preferred Acute Rehab [phone]   Carver Fila Koonce 11/29/2022, 8:41 AM

## 2022-11-29 NOTE — Progress Notes (Signed)
Report called to receiving facility [facility]. Reviewed AVS and DC instructions with nurse. PT has all pt belongings and leaving by [facility] transportation.

## 2022-11-29 NOTE — Progress Notes (Addendum)
Brief nephrology note  Sodium within acceptable range at 133. To be extra cautious will change urea to 30g in am and 15g in pm. Continue lasix daily. Will start potassium supplementation as well while on lasix. Would continue to limit free water outpatient.  We are arranging outpatient follow up. Would recommend she have a bmp checked tomorrow (7/26) at her facility as well as Monday (7/29). These results can be faxed to [facility] [phone] attention to Dr Valentino Nose.  Please include this information in patient's discharge paperwork.

## 2022-12-09 NOTE — Progress Notes (Deleted)
Cardiology Office Note:    Date:  12/09/2022   ID:  Erica Ball, DOB April 22, 1951, MRN [id number]  PCP:  Norm Salt, PA   [facility] Providers Cardiologist:  Thurmon Fair, MD { Click to update primary MD,subspecialty MD or APP then REFRESH:1}    Referring MD: Norm Salt, PA   No chief complaint on file. ***  History of Present Illness:    Erica Ball is a 72 y.o. female with a hx of tobacco abuse, heavy alcohol use, COPD, hypertension, CKD, and SVT. Question history of WPW as this was entered into a discharge summary in 2006.   She was hospitalized 07/2017 with atypical right-sided chest pain found to have right empyema requiring bronc and VATS.  While in the hospital she had SVT in the 180s that converted with IV metoprolol.  Echocardiogram showed an LVEF 60 to 65%, mild TR, and PASP 53 mmHg.  Nuclear stress test after discharge showed no evidence of ischemia or infarction.  She subsequently followed up with Dr. Eden Emms who also sees her brother, has not been seen since 2019.   She was hospitalized June 2024 after suffering a fall sustaining left tibial plateau fracture.  Ortho recommended conservative management, surgical repair was not planned.  Due to progressively worsening lethargy, MRI 10/2022 revealed evidence of acute/subacute stroke.  Hypertension was managed with losartan and hydralazine. Cardiology was consulted for SVT (0330-1000) associated with troponin elevation without chest pain.  She received IV lopressor x 2 and converted to SR.  Echocardiogram 10/2022 showed an LVEF 60-65%, grade 1 DD, mild LAE, mild to moderate AI. Unfortunately, she continued to have issues with SVT and was treated with adenosine. She was treated with PO lopressor transitioned to toprol and had no recurrence.   She was hospitalized 11/2022 with confusion found to be hyponatremic.  NA 119 on presentation with UA suspicious for UTI.  Nephrology subsequently consulted  and started her on urea, suspected SIADH.  She was continued on Lasix 40 mg daily.  She was discharged back to SNF.  She presents today for routine follow up.    SVT In the setting of sepsis and respiratory failure 2019 In the setting of fall with tibial plateau fracture 2024 Maintained on 100 mg Toprol daily   Hypertensive urgency Hypertension Maintained on hydralazine 25 mg TID   COPD Tobacco abuse EtOH abuse - recommended smoking cessation, no longer drinking in SNF - required librium taper in hospital   SIADH  - per nephrology, on urea - on 40 mg lasix and potassium   Past Medical History:  Diagnosis Date   Acute kidney injury (HCC) 07/10/2017   Acute respiratory failure with hypoxia (HCC) 07/10/2017   Alcohol abuse    Allergy    Cataract    Diverticulitis 10/30/2011   Elevated troponin 07/10/2017   Hypertension    Lobar pneumonia (HCC) 07/10/2017   Pleural effusion on right 07/10/2017   Sepsis (HCC) 07/10/2017   SVT (supraventricular tachycardia)    Tobacco abuse    UTI (urinary tract infection) 07/10/2017   Vitamin D deficiency 10/30/2011    Past Surgical History:  Procedure Laterality Date   ABDOMINAL HYSTERECTOMY     CATARACT EXTRACTION     FRACTURE SURGERY     IR THORACENTESIS ASP PLEURAL SPACE W/IMG GUIDE  07/11/2017   VIDEO ASSISTED THORACOSCOPY (VATS)/EMPYEMA Right 07/12/2017   Procedure: right VIDEO ASSISTED THORACOSCOPY (VATS) for drainage of EMPYEMA and decortication;  Surgeon: Loreli Slot, MD;  Location: [facility] OR;  Service: Thoracic;  Laterality: Right;   VIDEO BRONCHOSCOPY N/A 07/12/2017   Procedure: VIDEO BRONCHOSCOPY;  Surgeon: Loreli Slot, MD;  Location: [facility] OR;  Service: Thoracic;  Laterality: N/A;    Current Medications: No outpatient medications have been marked as taking for the 12/17/22 encounter (Appointment) with Marcelino Duster, PA.     Allergies:   Dextromethorphan   Social History   Socioeconomic History    Marital status: Divorced    Spouse name: Not on file   Number of children: Not on file   Years of education: Not on file   Highest education level: Not on file  Occupational History   Not on file  Tobacco Use   Smoking status: Every Day    Current packs/day: 1.50    Average packs/day: 1.5 packs/day for 53.0 years (79.5 ttl pk-yrs)    Types: Cigarettes   Smokeless tobacco: Never  Substance and Sexual Activity   Alcohol use: Yes    Alcohol/week: 21.0 standard drinks of alcohol    Types: 21 Shots of liquor per week    Comment: drinks three cups of vodka per day   Drug use: No   Sexual activity: Not Currently  Other Topics Concern   Not on file  Social History Narrative   Not on file   Social Determinants of Health   Financial Resource Strain: Patient Unable To Answer (11/17/2022)   Overall Financial Resource Strain (CARDIA)    Difficulty of Paying Living Expenses: Patient unable to answer  Food Insecurity: No Food Insecurity (11/17/2022)   Hunger Vital Sign    Worried About Running Out of Food in the Last Year: Never true    Ran Out of Food in the Last Year: Never true  Transportation Needs: No Transportation Needs (11/17/2022)   PRAPARE - Administrator, Civil Service (Medical): No    Lack of Transportation (Non-Medical): No  Physical Activity: Sufficiently Active (11/17/2022)   Exercise Vital Sign    Days of Exercise per Week: 7 days    Minutes of Exercise per Session: 30 min  Stress: No Stress Concern Present (11/17/2022)   [employer] of Occupational Health - Occupational Stress Questionnaire    Feeling of Stress : Only a little  Social Connections: Socially Isolated (11/17/2022)   Social Connection and Isolation Panel [NHANES]    Frequency of Communication with Friends and Family: More than three times a week    Frequency of Social Gatherings with Friends and Family: Once a week    Attends Religious Services: Never    Database administrator or  Organizations: No    Attends Engineer, structural: Never    Marital Status: Divorced     Family History: The patient's ***family history includes Heart disease in her brother, father, and mother; Hypertension in her brother, father, and mother; Stroke in her brother.  ROS:   Please see the history of present illness.    *** All other systems reviewed and are negative.  EKGs/Labs/Other Studies Reviewed:    The following studies were reviewed today: ***      Recent Labs: 10/24/2022: TSH 1.787 11/23/2022: ALT 26 11/26/2022: Magnesium 1.9 11/27/2022: Hemoglobin 10.8; Platelets 270 11/29/2022: BUN 85; Creatinine, Ser 0.96; Potassium 3.2; Sodium 133  Recent Lipid Panel    Component Value Date/Time   CHOL 118 10/30/2022 0058   TRIG 46 10/30/2022 0058   HDL 47 10/30/2022 0058   CHOLHDL 2.5 10/30/2022 0058  VLDL 9 10/30/2022 0058   LDLCALC 62 10/30/2022 0058     Risk Assessment/Calculations:   {Does this patient have ATRIAL FIBRILLATION?:[phone]}  No BP recorded.  {Refresh Note OR Click here to enter BP  :1}***         Physical Exam:    VS:  There were no vitals taken for this visit.    Wt Readings from Last 3 Encounters:  11/23/22 128 lb 15.5 oz (58.5 kg)  10/24/22 140 lb (63.5 kg)  12/12/17 140 lb (63.5 kg)     GEN: *** Well nourished, well developed in no acute distress HEENT: Normal NECK: No JVD; No carotid bruits LYMPHATICS: No lymphadenopathy CARDIAC: ***RRR, no murmurs, rubs, gallops RESPIRATORY:  Clear to auscultation without rales, wheezing or rhonchi  ABDOMEN: Soft, non-tender, non-distended MUSCULOSKELETAL:  No edema; No deformity  SKIN: Warm and dry NEUROLOGIC:  Alert and oriented x 3 PSYCHIATRIC:  Normal affect   ASSESSMENT:    No diagnosis found. PLAN:    In order of problems listed above:  ***      {Are you ordering a CV Procedure (e.g. stress test, cath, DCCV, TEE, etc)?   Press F2        :[id number]}    Medication  Adjustments/Labs and Tests Ordered: Current medicines are reviewed at length with the patient today.  Concerns regarding medicines are outlined above.  No orders of the defined types were placed in this encounter.  No orders of the defined types were placed in this encounter.   There are no Patient Instructions on file for this visit.   Signed, Marcelino Duster, PA  12/09/2022 9:01 AM    [facility]

## 2022-12-11 ENCOUNTER — Inpatient Hospital Stay (HOSPITAL_COMMUNITY)
Admission: EM | Admit: 2022-12-11 | Discharge: 2022-12-18 | DRG: 644 | Disposition: A | Discharge status: Skilled Nursing Facility | Payer: Medicare HMO | Source: Skilled Nursing Facility | Attending: Internal Medicine | Admitting: Internal Medicine

## 2022-12-11 ENCOUNTER — Emergency Department (HOSPITAL_COMMUNITY): Payer: Medicare HMO

## 2022-12-11 ENCOUNTER — Encounter (HOSPITAL_COMMUNITY): Payer: Self-pay

## 2022-12-11 DIAGNOSIS — E876 Hypokalemia: Secondary | ICD-10-CM | POA: Diagnosis present

## 2022-12-11 DIAGNOSIS — Z7982 Long term (current) use of aspirin: Secondary | ICD-10-CM

## 2022-12-11 DIAGNOSIS — E222 Syndrome of inappropriate secretion of antidiuretic hormone: Principal | ICD-10-CM | POA: Diagnosis present

## 2022-12-11 DIAGNOSIS — W19XXXD Unspecified fall, subsequent encounter: Secondary | ICD-10-CM | POA: Diagnosis present

## 2022-12-11 DIAGNOSIS — T502X5A Adverse effect of carbonic-anhydrase inhibitors, benzothiadiazides and other diuretics, initial encounter: Secondary | ICD-10-CM | POA: Diagnosis present

## 2022-12-11 DIAGNOSIS — Z8249 Family history of ischemic heart disease and other diseases of the circulatory system: Secondary | ICD-10-CM

## 2022-12-11 DIAGNOSIS — F1010 Alcohol abuse, uncomplicated: Secondary | ICD-10-CM | POA: Diagnosis present

## 2022-12-11 DIAGNOSIS — E871 Hypo-osmolality and hyponatremia: Secondary | ICD-10-CM | POA: Diagnosis not present

## 2022-12-11 DIAGNOSIS — E861 Hypovolemia: Secondary | ICD-10-CM | POA: Diagnosis present

## 2022-12-11 DIAGNOSIS — Z79899 Other long term (current) drug therapy: Secondary | ICD-10-CM

## 2022-12-11 DIAGNOSIS — J4489 Other specified chronic obstructive pulmonary disease: Secondary | ICD-10-CM | POA: Diagnosis present

## 2022-12-11 DIAGNOSIS — Z993 Dependence on wheelchair: Secondary | ICD-10-CM

## 2022-12-11 DIAGNOSIS — S82142D Displaced bicondylar fracture of left tibia, subsequent encounter for closed fracture with routine healing: Secondary | ICD-10-CM

## 2022-12-11 DIAGNOSIS — I10 Essential (primary) hypertension: Secondary | ICD-10-CM | POA: Diagnosis present

## 2022-12-11 DIAGNOSIS — E860 Dehydration: Secondary | ICD-10-CM | POA: Diagnosis present

## 2022-12-11 DIAGNOSIS — S82892D Other fracture of left lower leg, subsequent encounter for closed fracture with routine healing: Secondary | ICD-10-CM

## 2022-12-11 DIAGNOSIS — Z888 Allergy status to other drugs, medicaments and biological substances status: Secondary | ICD-10-CM

## 2022-12-11 DIAGNOSIS — F17210 Nicotine dependence, cigarettes, uncomplicated: Secondary | ICD-10-CM | POA: Diagnosis present

## 2022-12-11 DIAGNOSIS — M8580 Other specified disorders of bone density and structure, unspecified site: Secondary | ICD-10-CM | POA: Diagnosis present

## 2022-12-11 DIAGNOSIS — I5032 Chronic diastolic (congestive) heart failure: Secondary | ICD-10-CM | POA: Diagnosis present

## 2022-12-11 DIAGNOSIS — J449 Chronic obstructive pulmonary disease, unspecified: Secondary | ICD-10-CM | POA: Diagnosis present

## 2022-12-11 DIAGNOSIS — E785 Hyperlipidemia, unspecified: Secondary | ICD-10-CM | POA: Diagnosis present

## 2022-12-11 DIAGNOSIS — G8929 Other chronic pain: Secondary | ICD-10-CM | POA: Diagnosis present

## 2022-12-11 DIAGNOSIS — Z823 Family history of stroke: Secondary | ICD-10-CM

## 2022-12-11 DIAGNOSIS — J441 Chronic obstructive pulmonary disease with (acute) exacerbation: Secondary | ICD-10-CM | POA: Diagnosis present

## 2022-12-11 DIAGNOSIS — I110 Hypertensive heart disease with heart failure: Secondary | ICD-10-CM | POA: Diagnosis present

## 2022-12-11 LAB — CBC WITH DIFFERENTIAL/PLATELET
Abs Immature Granulocytes: 0.02 10*3/uL (ref 0.00–0.07)
Basophils Absolute: 0.1 10*3/uL (ref 0.0–0.1)
Basophils Relative: 1 %
Eosinophils Absolute: 0.3 10*3/uL (ref 0.0–0.5)
Eosinophils Relative: 5 %
HCT: 31.6 % — ABNORMAL LOW (ref 36.0–46.0)
Hemoglobin: 11.2 g/dL — ABNORMAL LOW (ref 12.0–15.0)
Immature Granulocytes: 0 %
Lymphocytes Relative: 20 %
Lymphs Abs: 1.3 10*3/uL (ref 0.7–4.0)
MCH: 33.5 pg (ref 26.0–34.0)
MCHC: 35.4 g/dL (ref 30.0–36.0)
MCV: 94.6 fL (ref 80.0–100.0)
Monocytes Absolute: 0.6 10*3/uL (ref 0.1–1.0)
Monocytes Relative: 8 %
Neutro Abs: 4.6 10*3/uL (ref 1.7–7.7)
Neutrophils Relative %: 66 %
Platelets: 306 10*3/uL (ref 150–400)
RBC: 3.34 MIL/uL — ABNORMAL LOW (ref 3.87–5.11)
RDW: 11.6 % (ref 11.5–15.5)
WBC: 6.8 10*3/uL (ref 4.0–10.5)
nRBC: 0 % (ref 0.0–0.2)

## 2022-12-11 LAB — COMPREHENSIVE METABOLIC PANEL
ALT: 16 U/L (ref 0–44)
AST: 17 U/L (ref 15–41)
Albumin: 3.8 g/dL (ref 3.5–5.0)
Alkaline Phosphatase: 56 U/L (ref 38–126)
Anion gap: 10 (ref 5–15)
BUN: 39 mg/dL — ABNORMAL HIGH (ref 8–23)
CO2: 27 mmol/L (ref 22–32)
Calcium: 9.1 mg/dL (ref 8.9–10.3)
Chloride: 87 mmol/L — ABNORMAL LOW (ref 98–111)
Creatinine, Ser: 0.58 mg/dL (ref 0.44–1.00)
GFR, Estimated: >60 mL/min (ref >60)
Glucose, Bld: 96 mg/dL (ref 70–99)
Potassium: 3.8 mmol/L (ref 3.5–5.1)
Sodium: 124 mmol/L — ABNORMAL LOW (ref 135–145)
Total Bilirubin: 0.8 mg/dL (ref 0.3–1.2)
Total Protein: 7 g/dL (ref 6.5–8.1)

## 2022-12-11 LAB — URINALYSIS, ROUTINE W REFLEX MICROSCOPIC
Bacteria, UA: NONE SEEN
Bilirubin Urine: NEGATIVE
Glucose, UA: NEGATIVE mg/dL
Hgb urine dipstick: NEGATIVE
Ketones, ur: NEGATIVE mg/dL
Nitrite: NEGATIVE
Protein, ur: NEGATIVE mg/dL
Specific Gravity, Urine: 1.003 — ABNORMAL LOW (ref 1.005–1.030)
pH: 7 (ref 5.0–8.0)

## 2022-12-11 LAB — MAGNESIUM: Magnesium: 2 mg/dL (ref 1.7–2.4)

## 2022-12-11 LAB — SODIUM, URINE, RANDOM: Sodium, Ur: 21 mmol/L

## 2022-12-11 NOTE — H&P (Signed)
Erica Ball IHK:[id number] DOB: 1951/04/02 DOA: 12/11/2022     PCP: Norm Salt, PA   Outpatient Specialists:   CARDS: Dr. Thurmon Fair, MD    Patient arrived to ER on 12/11/22 at 2150 Referred by Attending Tegeler, Canary Brim, *   Patient coming from:     From facility [facility]    Chief Complaint:   Chief Complaint  Patient presents with   Abnormal Lab    HPI: Erica Ball is a 72 y.o. female with medical history significant of hyponatremia secondary to SIADH, hypokalemia hypomagnesemia recurrent UTIs history of alcohol abuse hypertension history of SVT in the setting of sepsis, HLD left knee fracture    Presented with   was sent by SNF for abnormal sodium Patient has been sent to emergency department for hyponatremia from [facility] She has long history of alcohol abuse with recurrent admissions was able to be discharged to a nursing home She has history of hyponatremia last time in July she was admitted for serum sodium of 119 Was able to be discharged to SNF on 24 July 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 recommended fluid restriction eventually sodium went up to 135   Patient's sodium was down to 119 again she reports she has been drinking Gatorade at least 3 Gatorade's per day for past 1 week no associated nausea vomiting or diarrhea Pt is asymptomatic     Denies significant ETOH intake   Does not smoke   No results found for: "SARSCOV2NAA"      Regarding pertinent Chronic problems:    Hyperlipidemia -  on statins crestor Lipid Panel     Component Value Date/Time   CHOL 118 10/30/2022 0058   TRIG 46 10/30/2022 0058   HDL 47 10/30/2022 0058   CHOLHDL 2.5 10/30/2022 0058   VLDL 9 10/30/2022 0058   LDLCALC 62 10/30/2022 0058     HTN on Hydralazine, toprol   chronic CHF diastolic - last echo  Recent Results (from the past 75643 hour(s))  ECHOCARDIOGRAM COMPLETE   Collection Time: 10/30/22  3:15 PM  Result Value    Weight 2,240   Height 68   BP 150/84   S' Lateral 2.90   P 1/2 time 677   Area-P 1/2 3.77   Est EF 60 - 65%   Narrative      ECHOCARDIOGRAM REPORT      IMPRESSIONS    1. Left ventricular ejection fraction, by estimation, is 60 to 65%. The left ventricle has normal function. The left ventricle has no regional wall motion abnormalities. Left ventricular diastolic parameters are consistent with Grade I diastolic  dysfunction    2. Right ventricular systolic function is normal. The right ventricular size is normal.  3. Left atrial size was mildly dilated.  4. The mitral valve is normal in structure. No evidence of mitral valve regurgitation. No evidence of mitral stenosis.  5. The aortic valve is normal in structure. Aortic valve regurgitation is mild to moderate. No aortic stenosis is present.  6. The inferior vena cava is normal in size with greater than 50% respiratory variability, suggesting right atrial pressure of 3 mmHg.                COPD - not  followed by pulmonology   not  on baseline oxygen         Chronic anemia - baseline hg Hemoglobin & Hematocrit  Recent Labs    11/26/22 0407 11/27/22 0015 12/11/22  2219  HGB 11.2* 10.8* 11.2*     While in ER:   Sodium up to 124 now BUN up to 39    Lab Orders         CBC with Differential         Comprehensive metabolic panel         Osmolality         Sodium, urine, random         Urinalysis, Routine w reflex microscopic -Urine, Clean Catch         Magnesium       CXR - ***NON acute    Following Medications were ordered in ER: Medications - No data to display       ED Triage Vitals  Encounter Vitals Group     BP 12/11/22 2207 (!) 184/76     Systolic BP Percentile --      Diastolic BP Percentile --      Pulse Rate 12/11/22 2207 66     Resp 12/11/22 2207 17     Temp 12/11/22 2207 (!) 97.4 F (36.3 C)     Temp Source 12/11/22 2207 Oral     SpO2 12/11/22 2207 96 %     Weight 12/11/22 2213 128 lb 15.5 oz  (58.5 kg)     Height 12/11/22 2213 5\' 8"  (1.727 m)     Head Circumference --      Peak Flow --      Pain Score 12/11/22 2207 0     Pain Loc --      Pain Education --      Exclude from Growth Chart --   WUJW(11)@     _________________________________________ Significant initial  Findings: Abnormal Labs Reviewed  CBC WITH DIFFERENTIAL/PLATELET - Abnormal; Notable for the following components:      Result Value   RBC 3.34 (*)    Hemoglobin 11.2 (*)    HCT 31.6 (*)    All other components within normal limits  COMPREHENSIVE METABOLIC PANEL - Abnormal; Notable for the following components:   Sodium 124 (*)    Chloride 87 (*)    BUN 39 (*)    All other components within normal limits  URINALYSIS, ROUTINE W REFLEX MICROSCOPIC - Abnormal; Notable for the following components:   Color, Urine COLORLESS (*)    Specific Gravity, Urine 1.003 (*)    Leukocytes,Ua SMALL (*)    All other components within normal limits      _________________________ Troponin ***ordered Cardiac Panel (last 3 results) No results for input(s): "CKTOTAL", "CKMB", "TROPONINIHS", "RELINDX" in the last 72 hours.   ECG: Ordered Personally reviewed and interpreted by me showing: HR : 71 Rhythm:Sinus rhythm Consider left atrial enlargement Abnormal R-wave progression, early transition QTC 469     The recent clinical data is shown below. Vitals:   12/11/22 2215 12/11/22 2230 12/11/22 2245 12/11/22 2300  BP:  (!) 177/79  (!) 197/99  Pulse: 65  (!) 57   Resp: 12 14 12  (!) 24  Temp:      TempSrc:      SpO2: 97%  99%   Weight:      Height:         WBC     Component Value Date/Time   WBC 6.8 12/11/2022 2219   LYMPHSABS 1.3 12/11/2022 2219   MONOABS 0.6 12/11/2022 2219   EOSABS 0.3 12/11/2022 2219   BASOSABS 0.1 12/11/2022 2219       UA   no  evidence of UTI   dilute urine   Urine analysis:    Component Value Date/Time   COLORURINE COLORLESS (A) 12/11/2022 2219   APPEARANCEUR CLEAR 12/11/2022  2219   LABSPEC 1.003 (L) 12/11/2022 2219   PHURINE 7.0 12/11/2022 2219   GLUCOSEU NEGATIVE 12/11/2022 2219   HGBUR NEGATIVE 12/11/2022 2219   BILIRUBINUR NEGATIVE 12/11/2022 2219   BILIRUBINUR neg 10/11/2011 1425   KETONESUR NEGATIVE 12/11/2022 2219   PROTEINUR NEGATIVE 12/11/2022 2219   UROBILINOGEN 0.2 10/11/2011 1425   UROBILINOGEN 0.2 01/24/2008 2217   NITRITE NEGATIVE 12/11/2022 2219   LEUKOCYTESUR SMALL (A) 12/11/2022 2219    Results for orders placed or performed during the hospital encounter of 11/16/22  Urine Culture     Status: Abnormal   Collection Time: 11/16/22  8:04 PM   Specimen: Urine, Clean Catch  Result Value Ref Range Status   Specimen Description URINE, CLEAN CATCH  Final   Special Requests   Final    NONE Performed at [facility], [address] [place], [place] [address]    Culture MULTIPLE SPECIES PRESENT, SUGGEST RECOLLECTION (A)  Final   Report Status 11/18/2022 FINAL  Final    ABX started Antibiotics Given (last 72 hours)     None       Susceptibility data from last 90 days. Collected Specimen Info Organism AMPICILLIN AMPICILLIN/SULBACTAM CEFEPIME CEFTRIAXONE Ciprofloxacin Gentamicin Susc lslt Imipenem Nitrofurantoin Susc lslt Piperacillin + Tazobactam Trimethoprim/Sulfa  11/03/22 Urine, Random Proteus vulgaris  R  S  S   S  S  S  R  S  S    Escherichia coli  I  S  S  S  S  S  S  S  S  S       __________________________________________________________ Recent Labs  Lab 12/11/22 2219  NA 124*  K 3.8  CO2 27  GLUCOSE 96  BUN 39*  CREATININE 0.58  CALCIUM 9.1  MG 2.0    Cr    stable,  Lab Results  Component Value Date   CREATININE 0.58 12/11/2022   CREATININE 0.96 11/29/2022   CREATININE 0.88 11/28/2022    Recent Labs  Lab 12/11/22 2219  AST 17  ALT 16  ALKPHOS 56  BILITOT 0.8  PROT 7.0  ALBUMIN 3.8   Lab Results  Component Value Date   CALCIUM 9.1 12/11/2022   PHOS 4.4 11/26/2022    Plt: Lab Results   Component Value Date   PLT 306 12/11/2022       Recent Labs  Lab 12/11/22 2219  WBC 6.8  NEUTROABS 4.6  HGB 11.2*  HCT 31.6*  MCV 94.6  PLT 306    HG/HCT   stable,       Component Value Date/Time   HGB 11.2 (L) 12/11/2022 2219   HCT 31.6 (L) 12/11/2022 2219   MCV 94.6 12/11/2022 2219   MCV 105.7 (A) 07/01/2012 1623     _______________________________________________ Hospitalist was called for admission for   Hyponatremia     The following Work up has been ordered so far:  Orders Placed This Encounter  Procedures   CBC with Differential   Comprehensive metabolic panel   Osmolality   Sodium, urine, random   Urinalysis, Routine w reflex microscopic -Urine, Clean Catch   Magnesium   Consult to hospitalist     OTHER Significant initial  Findings:  labs showing:     DM  labs:  HbA1C: Recent Labs    10/24/22 0051  HGBA1C  4.7*       CBG (last 3)  No results for input(s): "GLUCAP" in the last 72 hours.        Cultures:    Component Value Date/Time   SDES URINE, CLEAN CATCH 11/16/2022 2004   SPECREQUEST  11/16/2022 2004    NONE Performed at [facility], [address], [place], [place] [address]    CULT MULTIPLE SPECIES PRESENT, SUGGEST RECOLLECTION (A) 11/16/2022 2004   REPTSTATUS 11/18/2022 FINAL 11/16/2022 2004     Radiological Exams on Admission: No results found. _______________________________________________________________________________________________________ Latest  Blood pressure (!) 197/99, pulse (!) 57, temperature (!) 97.4 F (36.3 C), temperature source Oral, resp. rate (!) 24, height 5\' 8"  (1.727 m), weight 58.5 kg, SpO2 99%.   Vitals  labs and radiology finding personally reviewed  Review of Systems:    Pertinent positives include:   non-productive cough,   Constitutional:  No weight loss, night sweats, Fevers, chills, fatigue, weight loss  HEENT:  No headaches, Difficulty swallowing,Tooth/dental problems,Sore  throat,  No sneezing, itching, ear ache, nasal congestion, post nasal drip,  Cardio-vascular:  No chest pain, Orthopnea, PND, anasarca, dizziness, palpitations.no Bilateral lower extremity swelling  GI:  No heartburn, indigestion, abdominal pain, nausea, vomiting, diarrhea, change in bowel habits, loss of appetite, melena, blood in stool, hematemesis Resp:  no shortness of breath at rest. No dyspnea on exertion, No excess mucus, no productive cough, No No coughing up of blood.No change in color of mucus.No wheezing. Skin:  no rash or lesions. No jaundice GU:  no dysuria, change in color of urine, no urgency or frequency. No straining to urinate.  No flank pain.  Musculoskeletal:  No joint pain or no joint swelling. No decreased range of motion. No back pain.  Psych:  No change in mood or affect. No depression or anxiety. No memory loss.  Neuro: no localizing neurological complaints, no tingling, no weakness, no double vision, no gait abnormality, no slurred speech, no confusion  All systems reviewed and apart from HOPI all are negative _______________________________________________________________________________________________ Past Medical History:   Past Medical History:  Diagnosis Date   Acute kidney injury (HCC) 07/10/2017   Acute respiratory failure with hypoxia (HCC) 07/10/2017   Alcohol abuse    Allergy    Cataract    Diverticulitis 10/30/2011   Elevated troponin 07/10/2017   Hypertension    Lobar pneumonia (HCC) 07/10/2017   Pleural effusion on right 07/10/2017   Sepsis (HCC) 07/10/2017   SVT (supraventricular tachycardia)    Tobacco abuse    UTI (urinary tract infection) 07/10/2017   Vitamin D deficiency 10/30/2011      Past Surgical History:  Procedure Laterality Date   ABDOMINAL HYSTERECTOMY     CATARACT EXTRACTION     FRACTURE SURGERY     IR THORACENTESIS ASP PLEURAL SPACE W/IMG GUIDE  07/11/2017   VIDEO ASSISTED THORACOSCOPY (VATS)/EMPYEMA Right 07/12/2017    Procedure: right VIDEO ASSISTED THORACOSCOPY (VATS) for drainage of EMPYEMA and decortication;  Surgeon: Loreli Slot, MD;  Location: [facility] OR;  Service: Thoracic;  Laterality: Right;   VIDEO BRONCHOSCOPY N/A 07/12/2017   Procedure: VIDEO BRONCHOSCOPY;  Surgeon: Loreli Slot, MD;  Location: [facility] OR;  Service: Thoracic;  Laterality: N/A;    Social History:  Ambulatory  wheelchair bound,      reports that she has been smoking cigarettes. She has a 79.5 pack-year smoking history. She has never used smokeless tobacco. She reports current alcohol use of about 21.0 standard drinks of  alcohol per week. She reports that she does not use drugs.     Family History:   Family History  Problem Relation Age of Onset   Hypertension Mother    Heart disease Mother    Hypertension Father    Heart disease Father    Stroke Brother    Hypertension Brother    Heart disease Brother    ______________________________________________________________________________________________ Allergies: Allergies  Allergen Reactions   Dextromethorphan Other (See Comments)    Makes her very angry and she wants to hurt people     Prior to Admission medications   Medication Sig Start Date End Date Taking? Authorizing Provider  albuterol (PROVENTIL HFA;VENTOLIN HFA) 108 (90 Base) MCG/ACT inhaler Inhale 1-2 puffs into the lungs every 4 (four) hours as needed for shortness of breath. 07/10/17   [provider]  aspirin EC 81 MG tablet Take 1 tablet (81 mg total) by mouth daily. 12/06/17   Wendall Stade, MD  cyanocobalamin 1000 MCG tablet Take 1 tablet (1,000 mcg total) by mouth daily. 11/28/22   Willeen Niece, MD  folic acid (FOLVITE) 1 MG tablet Take 1 tablet (1 mg total) by mouth daily. 11/05/22   Cathleen Corti, MD  furosemide (LASIX) 40 MG tablet Take 1 tablet (40 mg total) by mouth daily. 11/29/22   Willeen Niece, MD  guaiFENesin (ROBITUSSIN) 100 MG/5ML liquid Take 10 mLs by mouth every  4 (four) hours as needed for cough or to loosen phlegm. 10 ml by mouth every 4 hours as needed for 48 hours.    [provider]  hydrALAZINE (APRESOLINE) 25 MG tablet Take 25 mg by mouth 3 (three) times daily.    [provider]  magnesium oxide (MAG-OX) 400 (240 Mg) MG tablet Take 1 tablet (400 mg total) by mouth daily. 11/29/22   Willeen Niece, MD  metoprolol succinate (TOPROL XL) 100 MG 24 hr tablet Take 1 tablet (100 mg total) by mouth daily. Take with or immediately following a meal. 11/04/22 11/04/23  Cathleen Corti, MD  pantoprazole (PROTONIX) 40 MG tablet Take 1 tablet (40 mg total) by mouth daily. 11/29/22   Willeen Niece, MD  potassium chloride SA (KLOR-CON M) 20 MEQ tablet Take 1 tablet (20 mEq total) by mouth daily. 11/30/22   Willeen Niece, MD  rosuvastatin (CRESTOR) 10 MG tablet Take 1 tablet (10 mg total) by mouth daily. 11/05/22   Cathleen Corti, MD  thiamine (VITAMIN B-1) 100 MG tablet Take 1 tablet (100 mg total) by mouth daily. 11/05/22   Cathleen Corti, MD  urea (URE-NA) 15 g PACK oral packet Take 30 g by mouth daily. 11/30/22   Willeen Niece, MD  urea (URE-NA) 15 g PACK oral packet Take 15 g by mouth daily at 8 pm. 11/29/22 12/29/22  Willeen Niece, MD    ___________________________________________________________________________________________________ Physical Exam:    12/11/2022   11:00 PM 12/11/2022   10:45 PM 12/11/2022   10:30 PM  Vitals with BMI  Systolic 197  177  Diastolic 99  79  Pulse  57      1. General:  in No  Acute distress   Chronically ill  -appearing 2. Psychological: Alert and   Oriented 3. Head/ENT:    Dry Mucous Membranes                          Head Non traumatic, neck supple  Poor Dentition 4. SKIN:  decreased Skin turgor,  Skin clean Dry and intact no rash    5. Heart: Regular rate and rhythm no  Murmur, no Rub or gallop 6. Lungs:  y, no wheezes or crackles   7. Abdomen: Soft,   non-tender, Non distended    bowel sounds present 8. Lower extremities: no clubbing, cyanosis, no  edema 9. Neurologically Grossly intact, moving all 4 extremities equally   10. MSK: Normal range of motion    Chart has been reviewed  ______________________________________________________________________________________________  Assessment/Plan 72 y.o. female with medical history significant of hyponatremia secondary to SIADH, hypokalemia hypomagnesemia recurrent UTIs history of alcohol abuse hypertension history of SVT in the setting of sepsis, HLD left knee fracture   Admitted for   Hyponatremia      Present on Admission:  Hyponatremia  Hypertension  COPD GOLD 0 / still smoking   Alcohol abuse     Hyponatremia Recurrent last time patient was diagnosed with SIADH and responded well to fluid restriction will reattempt  Hypertension If blood pressure allows will restart Toprol  COPD GOLD 0 / still smoking  Stable chronic continue home medications  Alcohol abuse Currently in remission    Other plan as per orders.  DVT prophylaxis:  SCD      Code Status:    Code Status: Prior FULL CODE  as per patient   I had personally discussed CODE STATUS with patient   ACP   none     Family Communication:   Family not at  Bedside    Diet  fluid restriction   Disposition Plan:                             Back to current facility when stable                               Following barriers for discharge:                          Electrolytes improved                           Will need consultants to evaluate patient prior to discharge       Consult Orders  (From admission, onward)           Start     Ordered   12/11/22 2315  Consult to hospitalist  Once       Provider:  (Not yet assigned)  Question Answer Comment  Place call to: [facility]   Reason for Consult Admit      12/11/22 2314                               Would benefit from PT/OT  eval prior to DC  Ordered                                                        Transition of care consulted                   Nutrition  consulted                                      Consults called: none would benefit from nephrology consult in AM    Admission status:  ED Disposition     ED Disposition  Admit   Condition  --   Comment  The patient appears reasonably stabilized for admission considering the current resources, flow, and capabilities available in the ED at this time, and I doubt any other [facility] requiring further screening and/or treatment in the ED prior to admission is  present.           inpatient     I Expect 2 midnight stay secondary to severity of patient's current illness need for inpatient interventions justified by the following:     Severe lab/radiological/exam abnormalities including:    hyponatremia and extensive comorbidities including:  substance abuse  *Chronic pain   COPD/asthma   That are currently affecting medical management.   I expect  patient to be hospitalized for 2 midnights requiring inpatient medical care.  Patient is at high risk for adverse outcome (such as loss of life or disability) if not treated.  Indication for inpatient stay as follows:  Severe change from baseline regarding mental status Hemodynamic instability despite maximal medical therapy,  ongoing suicidal ideations,  severe pain requiring acute inpatient management,  inability to maintain oral hydration   persistent chest pain despite medical management Need for operative/procedural  intervention New or worsening hypoxia   Need for frequent labs    Level of care       progressive     stepdown   tele indefinitely please discontinue once patient no longer qualifies COVID-19 Labs      Rebeccah Ivins 12/11/2022, 11:59 PM ***  [facility]     after 2 AM please page floor coverage PA If 7AM-7PM, please contact the day team taking care of  the patient using [email or web address]

## 2022-12-11 NOTE — Assessment & Plan Note (Signed)
If blood pressure allows will restart Toprol

## 2022-12-11 NOTE — Assessment & Plan Note (Signed)
Recurrent last time patient was diagnosed with SIADH and responded well to fluid restriction will reattempt

## 2022-12-11 NOTE — ED Triage Notes (Signed)
Pt BIB from [facility] d/t abnormal labs. EMS reports pt is being sent to ED for hyponatremia.

## 2022-12-11 NOTE — Assessment & Plan Note (Signed)
Stable chronic continue home medications

## 2022-12-11 NOTE — ED Provider Notes (Signed)
[facility] AT [facility] Provider Note   CSN: [id number] Arrival date & time: 12/11/22  2150     History  Chief Complaint  Patient presents with   Abnormal Lab    Erica Ball is a 72 y.o. female.  The history is provided by the patient and medical records.  Abnormal Lab  72 year old female with history of alcohol abuse, hypertension, vitamin D deficiency, history of SVT, presenting to the ED with abnormal labs.  She had labs drawn today with recurrent hyponatremia, sodium was noted to be 119 on outside labs so she was sent in for further evaluation.  Patient was hospitalized for same last month and discharged 12 days ago.  She reports she has increased her salt intake and has been drinking 3 Gatorade's per day for the past week.  She has not had any nausea, vomiting, or diarrhea.  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  They did increase her home urea tablets, per MAR no missed doses.  Home Medications Prior to Admission medications   Medication Sig Start Date End Date Taking? Authorizing Provider  albuterol (PROVENTIL HFA;VENTOLIN HFA) 108 (90 Base) MCG/ACT inhaler Inhale 1-2 puffs into the lungs every 4 (four) hours as needed for shortness of breath. 07/10/17   [provider]  aspirin EC 81 MG tablet Take 1 tablet (81 mg total) by mouth daily. 12/06/17   Wendall Stade, MD  cyanocobalamin 1000 MCG tablet Take 1 tablet (1,000 mcg total) by mouth daily. 11/28/22   Willeen Niece, MD  folic acid (FOLVITE) 1 MG tablet Take 1 tablet (1 mg total) by mouth daily. 11/05/22   Cathleen Corti, MD  furosemide (LASIX) 40 MG tablet Take 1 tablet (40 mg total) by mouth daily. 11/29/22   Willeen Niece, MD  guaiFENesin (ROBITUSSIN) 100 MG/5ML liquid Take 10 mLs by mouth every 4 (four) hours as needed for cough or to loosen phlegm. 10 ml by mouth every 4 hours as needed for 48 hours.    [provider]  hydrALAZINE (APRESOLINE) 25 MG tablet Take 25 mg by mouth 3 (three) times daily.    [provider]  magnesium oxide (MAG-OX) 400 (240 Mg) MG tablet Take 1 tablet (400 mg total) by mouth daily. 11/29/22   Willeen Niece, MD  metoprolol succinate (TOPROL XL) 100 MG 24 hr tablet Take 1 tablet (100 mg total) by mouth daily. Take with or immediately following a meal. 11/04/22 11/04/23  Cathleen Corti, MD  pantoprazole (PROTONIX) 40 MG tablet Take 1 tablet (40 mg total) by mouth daily. 11/29/22   Willeen Niece, MD  potassium chloride SA (KLOR-CON M) 20 MEQ tablet Take 1 tablet (20 mEq total) by mouth daily. 11/30/22   Willeen Niece, MD  rosuvastatin (CRESTOR) 10 MG tablet Take 1 tablet (10 mg total) by mouth daily. 11/05/22   Cathleen Corti, MD  thiamine (VITAMIN B-1) 100 MG tablet Take 1 tablet (100 mg total) by mouth daily. 11/05/22   Cathleen Corti, MD  urea (URE-NA) 15 g PACK oral packet Take 30 g by mouth daily. 11/30/22   Willeen Niece, MD  urea (URE-NA) 15 g PACK oral packet Take 15 g by mouth daily at 8 pm. 11/29/22 12/29/22  Willeen Niece, MD      Allergies    Dextromethorphan    Review of Systems   Review of Systems  Constitutional:  Abnormal labs  All other systems reviewed and are negative.   Physical Exam Updated Vital Signs BP (!) 184/76 (BP Location: Left Arm)   Pulse 66   Temp (!) 97.4 F (36.3 C) (Oral)   Resp 17   Ht 5\' 8"  (1.727 m)   Wt 58.5 kg   SpO2 96%   BMI 19.61 kg/m   Physical Exam Vitals and nursing note reviewed.  Constitutional:      Appearance: She is well-developed.  HENT:     Head: Normocephalic and atraumatic.  Eyes:     Conjunctiva/sclera: Conjunctivae normal.     Pupils: Pupils are equal, round, and reactive to light.  Cardiovascular:     Rate and Rhythm: Normal rate and regular rhythm.     Heart sounds: Normal heart sounds.  Pulmonary:     Effort: Pulmonary effort is normal. No respiratory distress.      Breath sounds: Normal breath sounds. No rhonchi.  Abdominal:     General: Bowel sounds are normal.     Palpations: Abdomen is soft.     Tenderness: There is no abdominal tenderness. There is no rebound.  Musculoskeletal:        General: Normal range of motion.     Cervical back: Normal range of motion.  Skin:    General: Skin is warm and dry.  Neurological:     Mental Status: She is alert and oriented to person, place, and time.     ED Results / Procedures / Treatments   Labs (all labs ordered are listed, but only abnormal results are displayed) Labs Reviewed  CBC WITH DIFFERENTIAL/PLATELET - Abnormal; Notable for the following components:      Result Value   RBC 3.34 (*)    Hemoglobin 11.2 (*)    HCT 31.6 (*)    All other components within normal limits  COMPREHENSIVE METABOLIC PANEL - Abnormal; Notable for the following components:   Sodium 124 (*)    Chloride 87 (*)    BUN 39 (*)    All other components within normal limits  URINALYSIS, ROUTINE W REFLEX MICROSCOPIC - Abnormal; Notable for the following components:   Color, Urine COLORLESS (*)    Specific Gravity, Urine 1.003 (*)    Leukocytes,Ua SMALL (*)    All other components within normal limits  SODIUM, URINE, RANDOM  MAGNESIUM  OSMOLALITY    EKG None  Radiology No results found.  Procedures Procedures    Medications Ordered in ED Medications - No data to display  ED Course/ Medical Decision Making/ A&P                                 Medical Decision Making Amount and/or Complexity of Data Reviewed Labs: ordered. ECG/medicine tests: ordered and independent interpretation performed.  Risk Decision regarding hospitalization.   72 year old female presenting to the ED with abnormal labs.  Recent admission for hyponatremia, outpatient labs today at facility with sodium of 119.  Patient is largely asymptomatic of this.  Has been drinking Gatorade as instructed by nephrology and adding salt to her  foods.  Repeat labs today with Na+ today back down to 124.  Normal urine sodium.  Osmolality pending.  At discharge 11/29/22 sodium was up to 133.  Remainder of labs largely reassuring.  She will require admission for further management.  Discussed with hospitalist, Dr. Adela Glimpse-- will admit for ongoing care.  Final Clinical Impression(s) / ED  Diagnoses Final diagnoses:  Hyponatremia    Rx / DC Orders ED Discharge Orders     None         Garlon Hatchet, PA-C 12/11/22 2333    Tegeler, Canary Brim, MD 12/12/22 [phone]

## 2022-12-11 NOTE — Assessment & Plan Note (Signed)
Currently in remission 

## 2022-12-11 NOTE — Subjective & Objective (Signed)
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

## 2022-12-12 ENCOUNTER — Other Ambulatory Visit: Payer: Self-pay

## 2022-12-12 DIAGNOSIS — E871 Hypo-osmolality and hyponatremia: Secondary | ICD-10-CM | POA: Diagnosis not present

## 2022-12-12 LAB — COMPREHENSIVE METABOLIC PANEL
ALT: 14 U/L (ref 0–44)
AST: 17 U/L (ref 15–41)
Albumin: 3.5 g/dL (ref 3.5–5.0)
Alkaline Phosphatase: 46 U/L (ref 38–126)
Anion gap: 7 (ref 5–15)
BUN: 25 mg/dL — ABNORMAL HIGH (ref 8–23)
CO2: 30 mmol/L (ref 22–32)
Calcium: 8.9 mg/dL (ref 8.9–10.3)
Chloride: 93 mmol/L — ABNORMAL LOW (ref 98–111)
Creatinine, Ser: 0.5 mg/dL (ref 0.44–1.00)
GFR, Estimated: >60 mL/min (ref >60)
Glucose, Bld: 85 mg/dL (ref 70–99)
Potassium: 3.6 mmol/L (ref 3.5–5.1)
Sodium: 130 mmol/L — ABNORMAL LOW (ref 135–145)
Total Bilirubin: 0.7 mg/dL (ref 0.3–1.2)
Total Protein: 6.5 g/dL (ref 6.5–8.1)

## 2022-12-12 LAB — BASIC METABOLIC PANEL
Anion gap: 10 (ref 5–15)
Anion gap: 9 (ref 5–15)
Anion gap: 8 (ref 5–15)
BUN: 21 mg/dL (ref 8–23)
BUN: 21 mg/dL (ref 8–23)
BUN: 22 mg/dL (ref 8–23)
CO2: 28 mmol/L (ref 22–32)
CO2: 30 mmol/L (ref 22–32)
CO2: 28 mmol/L (ref 22–32)
Calcium: 8.8 mg/dL — ABNORMAL LOW (ref 8.9–10.3)
Calcium: 8.9 mg/dL (ref 8.9–10.3)
Calcium: 8.6 mg/dL — ABNORMAL LOW (ref 8.9–10.3)
Chloride: 94 mmol/L — ABNORMAL LOW (ref 98–111)
Chloride: 91 mmol/L — ABNORMAL LOW (ref 98–111)
Chloride: 90 mmol/L — ABNORMAL LOW (ref 98–111)
Creatinine, Ser: 0.52 mg/dL (ref 0.44–1.00)
Creatinine, Ser: 0.71 mg/dL (ref 0.44–1.00)
Creatinine, Ser: 0.67 mg/dL (ref 0.44–1.00)
GFR, Estimated: >60 mL/min (ref >60)
GFR, Estimated: >60 mL/min (ref >60)
GFR, Estimated: >60 mL/min (ref >60)
Glucose, Bld: 87 mg/dL (ref 70–99)
Glucose, Bld: 145 mg/dL — ABNORMAL HIGH (ref 70–99)
Glucose, Bld: 112 mg/dL — ABNORMAL HIGH (ref 70–99)
Potassium: 3.1 mmol/L — ABNORMAL LOW (ref 3.5–5.1)
Potassium: 3.5 mmol/L (ref 3.5–5.1)
Potassium: 4 mmol/L (ref 3.5–5.1)
Sodium: 132 mmol/L — ABNORMAL LOW (ref 135–145)
Sodium: 130 mmol/L — ABNORMAL LOW (ref 135–145)
Sodium: 126 mmol/L — ABNORMAL LOW (ref 135–145)

## 2022-12-12 LAB — CBC
HCT: 33 % — ABNORMAL LOW (ref 36.0–46.0)
Hemoglobin: 11.4 g/dL — ABNORMAL LOW (ref 12.0–15.0)
MCH: 32.9 pg (ref 26.0–34.0)
MCHC: 34.5 g/dL (ref 30.0–36.0)
MCV: 95.4 fL (ref 80.0–100.0)
Platelets: 344 10*3/uL (ref 150–400)
RBC: 3.46 MIL/uL — ABNORMAL LOW (ref 3.87–5.11)
RDW: 11.7 % (ref 11.5–15.5)
WBC: 6.3 10*3/uL (ref 4.0–10.5)
nRBC: 0 % (ref 0.0–0.2)

## 2022-12-12 LAB — PROTIME-INR
INR: 1 (ref 0.8–1.2)
Prothrombin Time: 13.5 seconds (ref 11.4–15.2)

## 2022-12-12 LAB — MAGNESIUM: Magnesium: 1.9 mg/dL (ref 1.7–2.4)

## 2022-12-12 LAB — VITAMIN B12: Vitamin B-12: 599 pg/mL (ref 180–914)

## 2022-12-12 LAB — PHOSPHORUS: Phosphorus: 4.1 mg/dL (ref 2.5–4.6)

## 2022-12-12 MED ORDER — SODIUM CHLORIDE 0.9% FLUSH: 3.0000 mL | INTRAVENOUS | Status: DC | PRN

## 2022-12-12 MED ORDER — ORAL CARE MOUTH RINSE: 15.0000 mL | OROMUCOSAL | Status: DC | PRN

## 2022-12-12 MED ORDER — ONDANSETRON HCL 4 MG PO TABS
4.0000 mg | ORAL_TABLET | Freq: Four times a day (QID) | ORAL | Status: DC | PRN
  Administered 2022-12-16: 4 mg via ORAL
  Filled 2022-12-12 (×3): qty 1

## 2022-12-12 MED ORDER — ALBUTEROL SULFATE HFA 108 (90 BASE) MCG/ACT IN AERS: 1.0000 | INHALATION_SPRAY | RESPIRATORY_TRACT | Status: DC | PRN

## 2022-12-12 MED ORDER — ACETAMINOPHEN 325 MG PO TABS
650.0000 mg | ORAL_TABLET | Freq: Four times a day (QID) | ORAL | Status: DC | PRN
  Administered 2022-12-12: 650 mg via ORAL
  Filled 2022-12-12: qty 2

## 2022-12-12 MED ORDER — METOPROLOL SUCCINATE ER 100 MG PO TB24
100.0000 mg | ORAL_TABLET | Freq: Every day | ORAL | Status: DC
  Administered 2022-12-12 – 2022-12-18 (×6): 100 mg via ORAL
  Filled 2022-12-12 (×4): qty 1
  Filled 2022-12-12: qty 2
  Filled 2022-12-12 (×2): qty 1

## 2022-12-12 MED ORDER — UREA 15 G PO PACK
30.0000 g | PACK | Freq: Every day | ORAL | Status: DC
  Administered 2022-12-13 – 2022-12-14 (×2): 30 g via ORAL
  Filled 2022-12-12 (×3): qty 2

## 2022-12-12 MED ORDER — SODIUM CHLORIDE 1 G PO TABS
1.0000 g | ORAL_TABLET | Freq: Two times a day (BID) | ORAL | Status: DC
  Administered 2022-12-12 – 2022-12-13 (×4): 1 g via ORAL
  Filled 2022-12-12 (×5): qty 1

## 2022-12-12 MED ORDER — SODIUM CHLORIDE 0.9 % IV SOLN: 250.0000 mL | INTRAVENOUS | Status: DC | PRN

## 2022-12-12 MED ORDER — HYDROCODONE-ACETAMINOPHEN 5-325 MG PO TABS
1.0000 | ORAL_TABLET | ORAL | Status: DC | PRN
  Filled 2022-12-12: qty 1

## 2022-12-12 MED ORDER — ROSUVASTATIN CALCIUM 10 MG PO TABS
10.0000 mg | ORAL_TABLET | Freq: Every day | ORAL | Status: DC
  Administered 2022-12-12 – 2022-12-18 (×7): 10 mg via ORAL
  Filled 2022-12-12 (×7): qty 1

## 2022-12-12 MED ORDER — FUROSEMIDE 40 MG PO TABS
40.0000 mg | ORAL_TABLET | Freq: Every day | ORAL | Status: DC
  Administered 2022-12-12 – 2022-12-14 (×3): 40 mg via ORAL
  Filled 2022-12-12 (×3): qty 1

## 2022-12-12 MED ORDER — ONDANSETRON HCL 4 MG/2ML IJ SOLN
4.0000 mg | Freq: Four times a day (QID) | INTRAMUSCULAR | Status: DC | PRN
  Administered 2022-12-12 – 2022-12-14 (×3): 4 mg via INTRAVENOUS
  Filled 2022-12-12 (×4): qty 2

## 2022-12-12 MED ORDER — HYDRALAZINE HCL 25 MG PO TABS
25.0000 mg | ORAL_TABLET | Freq: Three times a day (TID) | ORAL | Status: DC
  Administered 2022-12-12 – 2022-12-18 (×17): 25 mg via ORAL
  Filled 2022-12-12 (×19): qty 1

## 2022-12-12 MED ORDER — ALBUTEROL SULFATE (2.5 MG/3ML) 0.083% IN NEBU: 2.5000 mg | INHALATION_SOLUTION | RESPIRATORY_TRACT | Status: DC | PRN

## 2022-12-12 MED ORDER — MAGNESIUM OXIDE -MG SUPPLEMENT 400 (240 MG) MG PO TABS
400.0000 mg | ORAL_TABLET | Freq: Every day | ORAL | Status: DC
  Administered 2022-12-12 – 2022-12-18 (×7): 400 mg via ORAL
  Filled 2022-12-12 (×7): qty 1

## 2022-12-12 MED ORDER — PANTOPRAZOLE SODIUM 40 MG PO TBEC
40.0000 mg | DELAYED_RELEASE_TABLET | Freq: Every day | ORAL | Status: DC
  Administered 2022-12-12 – 2022-12-18 (×7): 40 mg via ORAL
  Filled 2022-12-12 (×7): qty 1

## 2022-12-12 MED ORDER — UREA 15 G PO PACK
15.0000 g | PACK | Freq: Every day | ORAL | Status: DC
  Administered 2022-12-13: 15 g via ORAL
  Filled 2022-12-12 (×2): qty 1

## 2022-12-12 MED ORDER — SODIUM CHLORIDE 0.9% FLUSH
3.0000 mL | Freq: Two times a day (BID) | INTRAVENOUS | Status: DC
  Administered 2022-12-12 – 2022-12-17 (×12): 3 mL via INTRAVENOUS

## 2022-12-12 MED ORDER — FOLIC ACID 1 MG PO TABS
1.0000 mg | ORAL_TABLET | Freq: Every day | ORAL | Status: DC
  Administered 2022-12-12 – 2022-12-18 (×7): 1 mg via ORAL
  Filled 2022-12-12 (×7): qty 1

## 2022-12-12 MED ORDER — ENOXAPARIN SODIUM 40 MG/0.4ML IJ SOSY
40.0000 mg | PREFILLED_SYRINGE | Freq: Every day | INTRAMUSCULAR | Status: DC
  Administered 2022-12-12 – 2022-12-18 (×7): 40 mg via SUBCUTANEOUS
  Filled 2022-12-12 (×7): qty 0.4

## 2022-12-12 MED ORDER — ACETAMINOPHEN 650 MG RE SUPP: 650.0000 mg | Freq: Four times a day (QID) | RECTAL | Status: DC | PRN

## 2022-12-12 MED ORDER — POTASSIUM CHLORIDE CRYS ER 20 MEQ PO TBCR
40.0000 meq | EXTENDED_RELEASE_TABLET | Freq: Once | ORAL | Status: AC
  Administered 2022-12-12: 40 meq via ORAL
  Filled 2022-12-12: qty 2

## 2022-12-12 MED ORDER — ASPIRIN 81 MG PO TBEC
81.0000 mg | DELAYED_RELEASE_TABLET | Freq: Every day | ORAL | Status: DC
  Administered 2022-12-12 – 2022-12-18 (×7): 81 mg via ORAL
  Filled 2022-12-12 (×7): qty 1

## 2022-12-12 NOTE — Progress Notes (Addendum)
PROGRESS NOTE    Erica Ball  ZOX:[id number] DOB: August 03, 1950 DOA: 12/11/2022 PCP: Patient, No Pcp Per   Brief Narrative: 72 year old with past medical history significant for hyponatremia secondary to SIADH, hypokalemia, UTI, history of alcohol abuse, hypertension, history of SVT in the setting of sepsis, hyperlipidemia, left knee fracture recent admission for hyponatremia discharged on sodium tablet, urea and Lasix.  Sent back from skilled nursing facility due to recurrent hyponatremia sodium level down to 19 at the rehab.  Sodium check in the ED was 124. Repeated sodium this morning 130.  Will continue with water restriction resume urea and sodium tablets and Lasix.  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  Repeated sodium in the ED was 124.  Sodium has increased to 130 with water restriction. -Case discussed with nephrology on-call, plan to resume sodium tablets, urea and Lasix.  Continue to monitor sodium.  Will probably be able to be transferred back to rehab tomorrow.  2-Hypertension: Resume Toprol, Lasix and hydralazine  COPD: Stable continue with nebulizers Alcohol abuse,history of currently in remission Hypokalemia; replete orally.       Estimated body mass index is 19.61 kg/m as calculated from the following:   Height as of this encounter: 5\' 8"  (1.727 m).   Weight as of this encounter: 58.5 kg.   DVT prophylaxis: Lovenox Code Status: Full code Family Communication:Son Updated.  Disposition Plan:  Status is: Inpatient Remains inpatient appropriate because: transfer back to rehab 8/8    Consultants:     Procedures:    Antimicrobials:    Subjective:she is alert, conversant. She was getting gatorade TID   Objective: Vitals:   12/12/22 0614 12/12/22 0930 12/12/22 1009 12/12/22 1200  BP:  (!) 198/92  (!) 140/52  Pulse:  70  60  Resp:  14  12  Temp: (!) 97.4 F (36.3 C)  98.1 F (36.7 C)   TempSrc: Oral  Oral   SpO2:  99%  99%  Weight:      Height:        Intake/Output Summary (Last 24 hours) at 12/12/2022 1341 Last data filed at 12/12/2022 0529 Gross per 24 hour  Intake 3 ml  Output --  Net 3 ml   Filed Weights   12/11/22 2213  Weight: 58.5 kg    Examination:  General exam: Appears calm and comfortable  Respiratory system: Clear to auscultation. Respiratory effort normal. Cardiovascular system: S1 & S2 heard, RRR. No JVD, murmurs, rubs, gallops or clicks. No pedal edema. Gastrointestinal system: Abdomen is nondistended, soft and nontender. No organomegaly or masses felt. Normal bowel sounds heard. Central nervous system: Alert and oriented. Extremities: Symmetric 5 x 5 power.    Data Reviewed: I have personally reviewed following labs and imaging studies  CBC: Recent Labs  Lab 12/11/22 2219 12/12/22 0529  WBC 6.8 6.3  NEUTROABS 4.6  --   HGB 11.2* 11.4*  HCT 31.6* 33.0*  MCV 94.6 95.4  PLT 306 344   Basic Metabolic Panel: Recent Labs  Lab 12/11/22 2219 12/12/22 0529 12/12/22 0936  NA 124* 130* 132*  K 3.8 3.6 3.1*  CL  87* 93* 94*  CO2 27 30 28   GLUCOSE 96 85 87  BUN 39* 25* 21  CREATININE 0.58 0.50 0.52  CALCIUM 9.1 8.9 8.8*  MG 2.0 1.9  --   PHOS 3.7 4.1  --    GFR: Estimated Creatinine Clearance: 57.8 mL/min (by C-G formula based on SCr of 0.52 mg/dL). Liver Function Tests: Recent Labs  Lab 12/11/22 2219 12/12/22 0529  AST 17 17  ALT 16 14  ALKPHOS 56 46  BILITOT 0.8 0.7  PROT 7.0 6.5  ALBUMIN 3.8 3.5   No results for input(s): "LIPASE", "AMYLASE" in the last 168 hours. Recent Labs  Lab 12/12/22 0018  AMMONIA 11    Coagulation Profile: Recent Labs  Lab 12/12/22 0018  INR 1.0   Cardiac Enzymes: Recent Labs  Lab 12/11/22 2219  CKTOTAL 62   BNP (last 3 results) No results for input(s): "PROBNP" in the last 8760 hours. HbA1C: No results for input(s): "HGBA1C" in the last 72 hours. CBG: No results for input(s): "GLUCAP" in the last 168 hours. Lipid Profile: No results for input(s): "CHOL", "HDL", "LDLCALC", "TRIG", "CHOLHDL", "LDLDIRECT" in the last 72 hours. Thyroid Function Tests: Recent Labs    12/11/22 2219  TSH 2.330   Anemia Panel: Recent Labs    12/12/22 0936  VITAMINB12 599   Sepsis Labs: No results for input(s): "PROCALCITON", "LATICACIDVEN" in the last 168 hours.  No results found for this or any previous visit (from the past 240 hour(s)).       Radiology Studies: DG CHEST PORT 1 VIEW  Result Date: 12/12/2022 CLINICAL DATA:  Hyponatremia. EXAM: PORTABLE CHEST 1 VIEW COMPARISON:  November 16, 2022 FINDINGS: The heart size and mediastinal contours are within normal limits. There is marked severity calcification of the thoracic aorta. Both lungs are clear. A chronic appearing deformity is seen involving the distal right clavicle. Multilevel degenerative changes are seen throughout the thoracic spine. IMPRESSION: No active cardiopulmonary disease. Electronically Signed   By: Aram Candela M.D.   On: 12/12/2022 00:20        Scheduled Meds:  aspirin EC  81 mg Oral Daily   folic acid  1 mg Oral Daily   furosemide  40 mg Oral Daily   hydrALAZINE  25 mg Oral TID   magnesium oxide  400 mg Oral Daily   metoprolol succinate  100 mg Oral Daily   pantoprazole  40 mg Oral Daily   potassium chloride  40 mEq Oral Once   rosuvastatin  10 mg Oral Daily   sodium chloride flush  3 mL Intravenous Q12H   sodium chloride  1 g Oral BID WC   urea  15 g Oral QHS   urea  30 g Oral Daily   Continuous Infusions:  sodium chloride       LOS: 1 day    Time spent: 35  minutes    Renald Haithcock A Karthik Whittinghill, MD [facility]   If 7PM-7AM, please contact night-coverage [email or web address]  12/12/2022, 1:41 PM

## 2022-12-12 NOTE — TOC Initial Note (Signed)
Transition of Care [facility]) - Initial/Assessment Note    Patient Details  Name: Erica Ball MRN: [id number] Date of Birth: 1951/03/10  [facility] CM/SW Contact:    Howell Rucks, RN Phone Number: 12/12/2022, 4:37 PM  Clinical Narrative:  Met with pt and family at bedside to introduce role of TOC/NCM and review for dc planning. Pt confirmed she is a current short term rehab resident and [facility] and plans to return. PT eval pending, await recommendation. Pt reports she is in the process of selecting a new PCP, NCM offered f/u appt at [facility], pt/family declined, pt reports she has a Rollator, potty chair, canes and toilet lift at home, no home care services prior to SNF admission.Codee 44 completed and signed, pt given copy. TOC will continue to follow.           Expected Discharge Plan: Skilled Nursing Facility Barriers to Discharge: Continued Medical Work up   Patient Goals and CMS Choice Patient states their goals for this hospitalization and ongoing recovery are:: return to [facility] short term rehab-SNF CMS Medicare.gov Compare Post Acute Care list provided to:: Patient        Expected Discharge Plan and Services   Discharge Planning Services: CM Consult   Living arrangements for the past 2 months: Single Family Home                                      Prior Living Arrangements/Services Living arrangements for the past 2 months: Single Family Home Lives with:: Self Patient language and need for interpreter reviewed:: Yes Do you feel safe going back to the place where you live?: Yes      Need for Family Participation in Patient Care: Yes (Comment) Care giver support system in place?: Yes (comment) Current home services: DME (Rollator, canes, shower chair, toilet lift, BSC) Criminal Activity/Legal Involvement Pertinent to Current Situation/Hospitalization: No - Comment as needed  Activities of Daily Living Home  Assistive Devices/Equipment: Walker (specify type), Other (Comment) (L knee brace) ADL Screening (condition at time of admission) Patient's cognitive ability adequate to safely complete daily activities?: Yes Is the patient deaf or have difficulty hearing?: No Does the patient have difficulty seeing, even when wearing glasses/contacts?: No Does the patient have difficulty concentrating, remembering, or making decisions?: Yes Patient able to express need for assistance with ADLs?: Yes Does the patient have difficulty dressing or bathing?: Yes Independently performs ADLs?: No Communication: Independent Dressing (OT): Needs assistance Is this a change from baseline?: Pre-admission baseline Grooming: Independent Feeding: Independent Bathing: Needs assistance Is this a change from baseline?: Pre-admission baseline Toileting: Needs assistance Is this a change from baseline?: Pre-admission baseline In/Out Bed: Needs assistance Is this a change from baseline?: Pre-admission baseline Walks in Home: Needs assistance Is this a change from baseline?: Pre-admission baseline Does the patient have difficulty walking or climbing stairs?: Yes Weakness of Legs: Both Weakness of Arms/Hands: None  Permission Sought/Granted Permission sought to share information with : Case Manager Permission granted to share information with : Yes, Verbal Permission Granted  Share Information with NAME: Fannie Knee, RN           Emotional Assessment Appearance:: Appears stated age Attitude/Demeanor/Rapport: Gracious Affect (typically observed): Accepting Orientation: : Oriented to Self, Oriented to Place, Oriented to  Time, Oriented to Situation Alcohol / Substance Use: Not Applicable Psych Involvement: No (comment)  Admission  diagnosis:  Hyponatremia [E87.1] Patient Active Problem List   Diagnosis Date Noted   Protein-calorie malnutrition, severe 11/23/2022   Generalized weakness 11/21/2022   SVT  (supraventricular tachycardia) 11/04/2022   Demand ischemia 11/04/2022   Malnutrition of moderate degree 10/31/2022   Tibial plateau fracture 10/24/2022   Hyponatremia 10/24/2022   Hypomagnesemia 10/24/2022   Tibial plateau fracture, left, closed, initial encounter 10/23/2022   Fall at home, initial encounter 10/23/2022   COPD GOLD 0 / still smoking  12/02/2017   Elevated troponin 07/10/2017   Pleural effusion on right 07/10/2017   UTI (urinary tract infection) 07/10/2017   Sepsis (HCC) 07/10/2017   Lobar pneumonia (HCC) 07/10/2017   Acute kidney injury (HCC) 07/10/2017   Acute respiratory failure with hypoxia (HCC) 07/10/2017   Alcohol abuse    Tobacco abuse 08/20/2014   Hypertension 10/30/2011   Diverticulitis 10/30/2011   Vitamin D deficiency 10/30/2011   PCP:  Patient, No Pcp Per Pharmacy:   [id number] [place],  - [address] AT [facility] [address] [place] [address] Phone: [phone] Fax: [phone]     Social Determinants of Health (SDOH) Social History: SDOH Screenings   Food Insecurity: No Food Insecurity (12/12/2022)  Housing: Low Risk  (12/12/2022)  Transportation Needs: No Transportation Needs (12/12/2022)  Utilities: Not At Risk (12/12/2022)  Alcohol Screen: Low Risk  (11/17/2022)  Depression (PHQ2-9): Low Risk  (11/17/2022)  Financial Resource Strain: Patient Unable To Answer (11/17/2022)  Physical Activity: Sufficiently Active (11/17/2022)  Social Connections: Socially Isolated (11/17/2022)  Stress: No Stress Concern Present (11/17/2022)  Tobacco Use: High Risk (12/11/2022)  Health Literacy: Inadequate Health Literacy (11/17/2022)   SDOH Interventions:     Readmission Risk Interventions    12/12/2022    4:34 PM  Readmission Risk Prevention Plan  Transportation Screening Complete  PCP or Specialist Appt within 3-5 Days Complete  HRI or Home Care Consult Complete  Social Work Consult for Recovery Care  Planning/Counseling Complete  Palliative Care Screening Not Applicable  Medication Review Oceanographer) Complete

## 2022-12-12 NOTE — Progress Notes (Signed)
PT Cancellation Note  Patient Details Name: Erica Ball MRN: [id number] DOB: 11-Oct-1950   Cancelled Treatment:    Reason Eval/Treat Not Completed: Other (comment) Patient from SNF, to be admitted. Will see patient after transfer to floor.  Blanchard Kelch PT Acute Rehabilitation Services Office [phone] Weekend pager-[phone]    Rada Hay 12/12/2022, 7:40 AM

## 2022-12-12 NOTE — ED Notes (Signed)
ED TO INPATIENT HANDOFF REPORT  Name/Age/Gender Erica Ball 72 y.o. female  Code Status    Code Status Orders  (From admission, onward)           Start     Ordered   12/12/22 0005  Full code  Continuous       Question:  By:  Answer:  Consent: discussion documented in EHR   12/12/22 0005           Code Status History     Date Active Date Inactive Code Status Order ID Comments User Context   11/16/2022 2011 11/29/2022 1753 Full Code [id number]  Darlin Drop, DO ED   10/24/2022 0027 11/06/2022 2322 Full Code [id number]  Gery Pray, MD ED   07/10/2017 2055 07/18/2017 1925 Full Code [id number]  Lorretta Harp, MD ED       Home/SNF/Other Nursing Home  Chief Complaint Hyponatremia [E87.1]  Level of Care/Admitting Diagnosis ED Disposition     ED Disposition  Admit   Condition  --   Comment  Hospital Area: [facility] [[id number]]  Level of Care: Progressive [102]  Admit to Progressive based on following criteria: NEPHROLOGY stable condition requiring close monitoring for AKI, requiring Hemodialysis or Peritoneal Dialysis either from expected electrolyte imbalance, acidosis, or fluid overload that can be managed by NIPPV or high flow oxygen.  May admit patient to [facility] or [facility] if equivalent level of care is available:: No  Covid Evaluation: Asymptomatic - no recent exposure (last 10 days) testing not required  Diagnosis: Hyponatremia [643329]  Admitting Physician: Therisa Doyne [[id number]]  Attending Physician: Therisa Doyne [[id number]]  Certification:: I certify this patient will need inpatient services for at least 2 midnights  Estimated Length of Stay: 2          Medical History Past Medical History:  Diagnosis Date   Acute kidney injury (HCC) 07/10/2017   Acute respiratory failure with hypoxia (HCC) 07/10/2017   Alcohol abuse    Allergy    Cataract    Diverticulitis 10/30/2011   Elevated troponin 07/10/2017    Hypertension    Lobar pneumonia (HCC) 07/10/2017   Pleural effusion on right 07/10/2017   Sepsis (HCC) 07/10/2017   SVT (supraventricular tachycardia)    Tobacco abuse    UTI (urinary tract infection) 07/10/2017   Vitamin D deficiency 10/30/2011    Allergies Allergies  Allergen Reactions   Dextromethorphan Other (See Comments)    Makes her very angry and she wants to hurt people    IV Location/Drains/Wounds Patient Lines/Drains/Airways Status     Active Line/Drains/Airways     Name Placement date Placement time Site Days   Peripheral IV 12/11/22 20 G 1" Right Antecubital 12/11/22  2225  Antecubital  1            Labs/Imaging Results for orders placed or performed during the hospital encounter of 12/11/22 (from the past 48 hour(s))  CBC with Differential     Status: Abnormal   Collection Time: 12/11/22 10:19 PM  Result Value Ref Range   WBC 6.8 4.0 - 10.5 K/uL   RBC 3.34 (L) 3.87 - 5.11 MIL/uL   Hemoglobin 11.2 (L) 12.0 - 15.0 g/dL   HCT 51.8 (L) 84.1 - 66.0 %   MCV 94.6 80.0 - 100.0 fL   MCH 33.5 26.0 - 34.0 pg   MCHC 35.4 30.0 - 36.0 g/dL   RDW 63.0 16.0 - 10.9 %   Platelets 306 150 - 400 K/uL  nRBC 0.0 0.0 - 0.2 %   Neutrophils Relative % 66 %   Neutro Abs 4.6 1.7 - 7.7 K/uL   Lymphocytes Relative 20 %   Lymphs Abs 1.3 0.7 - 4.0 K/uL   Monocytes Relative 8 %   Monocytes Absolute 0.6 0.1 - 1.0 K/uL   Eosinophils Relative 5 %   Eosinophils Absolute 0.3 0.0 - 0.5 K/uL   Basophils Relative 1 %   Basophils Absolute 0.1 0.0 - 0.1 K/uL   Immature Granulocytes 0 %   Abs Immature Granulocytes 0.02 0.00 - 0.07 K/uL    Comment: Performed at [facility], [address], [place], [place] [address]  Comprehensive metabolic panel     Status: Abnormal   Collection Time: 12/11/22 10:19 PM  Result Value Ref Range   Sodium 124 (L) 135 - 145 mmol/L   Potassium 3.8 3.5 - 5.1 mmol/L   Chloride 87 (L) 98 - 111 mmol/L   CO2 27 22 - 32 mmol/L   Glucose,  Bld 96 70 - 99 mg/dL    Comment: Glucose reference range applies only to samples taken after fasting for at least 8 hours.   BUN 39 (H) 8 - 23 mg/dL   Creatinine, Ser 8.41 0.44 - 1.00 mg/dL   Calcium 9.1 8.9 - 32.4 mg/dL   Total Protein 7.0 6.5 - 8.1 g/dL   Albumin 3.8 3.5 - 5.0 g/dL   AST 17 15 - 41 U/L   ALT 16 0 - 44 U/L   Alkaline Phosphatase 56 38 - 126 U/L   Total Bilirubin 0.8 0.3 - 1.2 mg/dL   GFR, Estimated >40 >10 mL/min    Comment: (NOTE) Calculated using the CKD-EPI Creatinine Equation (2021)    Anion gap 10 5 - 15    Comment: Performed at [facility], [address] [place], [place] [address]  Osmolality     Status: None   Collection Time: 12/11/22 10:19 PM  Result Value Ref Range   Osmolality 275 275 - 295 mOsm/kg    Comment: PERFORMED AT [facility] Performed at [facility], [address] [place], [place] [address]   Sodium, urine, random     Status: None   Collection Time: 12/11/22 10:19 PM  Result Value Ref Range   Sodium, Ur 21 mmol/L    Comment: Performed at [facility], [address] [place], [place] [address]  Urinalysis, Routine w reflex microscopic -Urine, Clean Catch     Status: Abnormal   Collection Time: 12/11/22 10:19 PM  Result Value Ref Range   Color, Urine COLORLESS (A) YELLOW   APPearance CLEAR CLEAR   Specific Gravity, Urine 1.003 (L) 1.005 - 1.030   pH 7.0 5.0 - 8.0   Glucose, UA NEGATIVE NEGATIVE mg/dL   Hgb urine dipstick NEGATIVE NEGATIVE   Bilirubin Urine NEGATIVE NEGATIVE   Ketones, ur NEGATIVE NEGATIVE mg/dL   Protein, ur NEGATIVE NEGATIVE mg/dL   Nitrite NEGATIVE NEGATIVE   Leukocytes,Ua SMALL (A) NEGATIVE   RBC / HPF 0-5 0 - 5 RBC/hpf   WBC, UA 0-5 0 - 5 WBC/hpf   Bacteria, UA NONE SEEN NONE SEEN   Squamous Epithelial / HPF 0-5 0 - 5 /HPF    Comment: Performed at [facility], [address], [place], [place] [address]  Magnesium     Status: None    Collection Time: 12/11/22 10:19 PM  Result Value Ref Range   Magnesium 2.0 1.7 -  2.4 mg/dL    Comment: Performed at [facility], [address] [place], [place] [address]  CK     Status: None   Collection Time: 12/11/22 10:19 PM  Result Value Ref Range   Total CK 62 38 - 234 U/L    Comment: Performed at [facility], [address], [place], [place] [address]  Phosphorus     Status: None   Collection Time: 12/11/22 10:19 PM  Result Value Ref Range   Phosphorus 3.7 2.5 - 4.6 mg/dL    Comment: Performed at [facility] [address], [place], [place] [address]  TSH     Status: None   Collection Time: 12/11/22 10:19 PM  Result Value Ref Range   TSH 2.330 0.350 - 4.500 uIU/mL    Comment: Performed by a 3rd Generation assay with a functional sensitivity of <=0.01 uIU/mL. Performed at [facility], [address] [place], [place] [address]   Blood gas, venous     Status: Abnormal   Collection Time: 12/12/22 12:09 AM  Result Value Ref Range   pH, Ven 7.4 7.25 - 7.43   pCO2, Ven 57 44 - 60 mmHg   pO2, Ven <31 (LL) 32 - 45 mmHg    Comment: CRITICAL RESULT CALLED TO, READ BACK BY AND VERIFIED WITH: J. [facility], RN 12/12/22 0043 J. COLE    Bicarbonate 35.3 (H) 20.0 - 28.0 mmol/L   Acid-Base Excess 8.5 (H) 0.0 - 2.0 mmol/L   O2 Saturation 37.7 %   Patient temperature 37.0     Comment: Performed at [facility], [address] [place], [place] [address]  Osmolality, urine     Status: Abnormal   Collection Time: 12/12/22 12:17 AM  Result Value Ref Range   Osmolality, Ur 166 (L) 300 - 900 mOsm/kg    Comment: PERFORMED AT [facility] Performed at [facility], [address] [place], [place] [address]   Creatinine, urine, random     Status: None   Collection Time: 12/12/22 12:17 AM  Result Value Ref Range   Creatinine, Urine <10 mg/dL    Comment: Performed at [facility], [address] [place], [place] [address]  Urinalysis, Complete w Microscopic -Urine, Clean Catch     Status: Abnormal   Collection Time: 12/12/22 12:17 AM  Result Value Ref Range   Color, Urine COLORLESS (A) YELLOW   APPearance CLEAR CLEAR   Specific Gravity, Urine 1.003 (L) 1.005 - 1.030   pH 8.0 5.0 - 8.0   Glucose, UA NEGATIVE NEGATIVE mg/dL   Hgb urine dipstick NEGATIVE NEGATIVE   Bilirubin Urine NEGATIVE NEGATIVE   Ketones, ur NEGATIVE NEGATIVE mg/dL   Protein, ur NEGATIVE NEGATIVE mg/dL   Nitrite NEGATIVE NEGATIVE   Leukocytes,Ua TRACE (A) NEGATIVE   RBC / HPF 0-5 0 - 5 RBC/hpf   WBC, UA 0-5 0 - 5 WBC/hpf   Bacteria, UA NONE SEEN NONE SEEN   Squamous Epithelial / HPF 0-5 0 - 5 /HPF    Comment: Performed at [facility], [address] [place], [place] [address]  Ammonia     Status: None   Collection Time: 12/12/22 12:18 AM  Result Value Ref Range   Ammonia 11 9 - 35 umol/L    Comment: Performed at [facility], [address] [place], [place] [address]  Protime-INR     Status: None   Collection Time:  12/12/22 12:18 AM  Result Value Ref Range   Prothrombin Time 13.5 11.4 - 15.2 seconds   INR 1.0 0.8 - 1.2    Comment: (NOTE) INR goal varies based on device and disease states. Performed at [facility], [address] [place], [place] [address]   Prealbumin     Status: None   Collection Time: 12/12/22  5:28 AM  Result Value Ref Range   Prealbumin 19 18 - 38 mg/dL    Comment: Performed at [facility], [address] [place], [place] [address]  Magnesium     Status: None   Collection Time: 12/12/22  5:29 AM  Result Value Ref Range   Magnesium 1.9 1.7 - 2.4 mg/dL    Comment: Performed at [facility], [address], [place], [place] [address]  Phosphorus     Status: None   Collection Time: 12/12/22  5:29 AM  Result Value Ref Range   Phosphorus 4.1 2.5 - 4.6  mg/dL    Comment: Performed at [facility], [address] [place], [place] [address]  Comprehensive metabolic panel     Status: Abnormal   Collection Time: 12/12/22  5:29 AM  Result Value Ref Range   Sodium 130 (L) 135 - 145 mmol/L   Potassium 3.6 3.5 - 5.1 mmol/L   Chloride 93 (L) 98 - 111 mmol/L   CO2 30 22 - 32 mmol/L   Glucose, Bld 85 70 - 99 mg/dL    Comment: Glucose reference range applies only to samples taken after fasting for at least 8 hours.   BUN 25 (H) 8 - 23 mg/dL   Creatinine, Ser 4.74 0.44 - 1.00 mg/dL   Calcium 8.9 8.9 - 25.9 mg/dL   Total Protein 6.5 6.5 - 8.1 g/dL   Albumin 3.5 3.5 - 5.0 g/dL   AST 17 15 - 41 U/L   ALT 14 0 - 44 U/L   Alkaline Phosphatase 46 38 - 126 U/L   Total Bilirubin 0.7 0.3 - 1.2 mg/dL   GFR, Estimated >56 >38 mL/min    Comment: (NOTE) Calculated using the CKD-EPI Creatinine Equation (2021)    Anion gap 7 5 - 15    Comment: Performed at [facility], [address] [place], [place] [address]  CBC     Status: Abnormal   Collection Time: 12/12/22  5:29 AM  Result Value Ref Range   WBC 6.3 4.0 - 10.5 K/uL   RBC 3.46 (L) 3.87 - 5.11 MIL/uL   Hemoglobin 11.4 (L) 12.0 - 15.0 g/dL   HCT 32.9 (L) 51.8 - 84.1 %   MCV 95.4 80.0 - 100.0 fL   MCH 32.9 26.0 - 34.0 pg   MCHC 34.5 30.0 - 36.0 g/dL   RDW 66.0 63.0 - 16.0 %   Platelets 344 150 - 400 K/uL   nRBC 0.0 0.0 - 0.2 %    Comment: Performed at [facility] [address] [place], [place] [address]  Basic metabolic panel     Status: Abnormal   Collection Time: 12/12/22  9:36 AM  Result Value Ref Range   Sodium 132 (L) 135 - 145 mmol/L   Potassium 3.1 (L) 3.5 - 5.1 mmol/L   Chloride 94 (L) 98 - 111 mmol/L   CO2 28 22 - 32 mmol/L   Glucose, Bld 87 70 - 99 mg/dL    Comment: Glucose reference range applies only to samples taken after fasting  for at least 8 hours.   BUN 21 8 - 23 mg/dL   Creatinine, Ser 6.64 0.44 - 1.00 mg/dL    Calcium 8.8 (L) 8.9 - 10.3 mg/dL   GFR, Estimated >40 >34 mL/min    Comment: (NOTE) Calculated using the CKD-EPI Creatinine Equation (2021)    Anion gap 10 5 - 15    Comment: Performed at [facility], [address], [place], [place] [address]  Vitamin B12     Status: None   Collection Time: 12/12/22  9:36 AM  Result Value Ref Range   Vitamin B-12 599 180 - 914 pg/mL    Comment: (NOTE) This assay is not validated for testing neonatal or myeloproliferative syndrome specimens for Vitamin B12 levels. Performed at [facility], [address], [place], [place] [address]    DG CHEST PORT 1 VIEW  Result Date: 12/12/2022 CLINICAL DATA:  Hyponatremia. EXAM: PORTABLE CHEST 1 VIEW COMPARISON:  November 16, 2022 FINDINGS: The heart size and mediastinal contours are within normal limits. There is marked severity calcification of the thoracic aorta. Both lungs are clear. A chronic appearing deformity is seen involving the distal right clavicle. Multilevel degenerative changes are seen throughout the thoracic spine. IMPRESSION: No active cardiopulmonary disease. Electronically Signed   By: Aram Candela M.D.   On: 12/12/2022 00:20    Pending Labs Unresulted Labs (From admission, onward)     Start     Ordered   12/12/22 1000  Basic metabolic panel  Now then every 6 hours,   R (with TIMED occurrences)     Question:  Release to patient  Answer:  Immediate   12/12/22 0006            Vitals/Pain Today's Vitals   12/12/22 0930 12/12/22 1009 12/12/22 1027 12/12/22 1136  BP: (!) 198/92     Pulse: 70     Resp: 14     Temp:  98.1 F (36.7 C)    TempSrc:  Oral    SpO2: 99%     Weight:      Height:      PainSc:   4  3     Isolation Precautions No active isolations  Medications Medications  aspirin EC tablet 81 mg (81 mg Oral Given 12/12/22 0924)  folic acid (FOLVITE) tablet 1 mg (1 mg Oral Given 12/12/22 0925)  magnesium oxide (MAG-OX) tablet 400 mg  (400 mg Oral Given 12/12/22 0924)  pantoprazole (PROTONIX) EC tablet 40 mg (40 mg Oral Given 12/12/22 0923)  metoprolol succinate (TOPROL-XL) 24 hr tablet 100 mg (100 mg Oral Given 12/12/22 0925)  rosuvastatin (CRESTOR) tablet 10 mg (10 mg Oral Given 12/12/22 0925)  acetaminophen (TYLENOL) tablet 650 mg (650 mg Oral Given 12/12/22 0923)    Or  acetaminophen (TYLENOL) suppository 650 mg ( Rectal See Alternative 12/12/22 0923)  HYDROcodone-acetaminophen (NORCO/VICODIN) 5-325 MG per tablet 1-2 tablet (has no administration in time range)  ondansetron (ZOFRAN) tablet 4 mg (has no administration in time range)    Or  ondansetron (ZOFRAN) injection 4 mg (has no administration in time range)  sodium chloride flush (NS) 0.9 % injection 3 mL (3 mLs Intravenous Given 12/12/22 1200)  sodium chloride flush (NS) 0.9 % injection 3 mL (has no administration in time range)  0.9 %  sodium chloride infusion (has no administration in time range)  albuterol (PROVENTIL) (2.5 MG/3ML) 0.083% nebulizer solution 2.5 mg (has no administration in time range)  hydrALAZINE (APRESOLINE) tablet 25 mg (  25 mg Oral Given 12/12/22 0924)  urea (URE-NA) oral packet 30 g (30 g Oral Patient Refused/Not Given 12/12/22 1200)  urea (URE-NA) oral packet 15 g (has no administration in time range)  sodium chloride tablet 1 g (1 g Oral Given 12/12/22 1159)  furosemide (LASIX) tablet 40 mg (40 mg Oral Given 12/12/22 1159)    Mobility non-ambulatory

## 2022-12-12 NOTE — Care Management CC44 (Signed)
Condition Code 44 Documentation Completed  Patient Details  Name: Erica Ball MRN: [id number] Date of Birth: [birth year]   Condition Code 44 given:  Yes  Patient signature on Condition Code 44 notice:  Yes  Documentation of 2 MD's agreement:  Yes  Code 44 added to claim:  Yes     Howell Rucks, RN 12/12/2022, 4:30 PM

## 2022-12-13 DIAGNOSIS — E871 Hypo-osmolality and hyponatremia: Secondary | ICD-10-CM | POA: Diagnosis not present

## 2022-12-13 LAB — BASIC METABOLIC PANEL
Anion gap: 8 (ref 5–15)
BUN: 58 mg/dL — ABNORMAL HIGH (ref 8–23)
CO2: 27 mmol/L (ref 22–32)
Calcium: 8.6 mg/dL — ABNORMAL LOW (ref 8.9–10.3)
Chloride: 91 mmol/L — ABNORMAL LOW (ref 98–111)
Creatinine, Ser: 0.81 mg/dL (ref 0.44–1.00)
GFR, Estimated: >60 mL/min (ref >60)
Glucose, Bld: 167 mg/dL — ABNORMAL HIGH (ref 70–99)
Potassium: 4.1 mmol/L (ref 3.5–5.1)
Sodium: 126 mmol/L — ABNORMAL LOW (ref 135–145)

## 2022-12-13 MED ORDER — SODIUM CHLORIDE 1 G PO TABS
1.0000 g | ORAL_TABLET | Freq: Once | ORAL | Status: AC
  Administered 2022-12-13: 1 g via ORAL
  Filled 2022-12-13: qty 1

## 2022-12-13 MED ORDER — ENSURE ENLIVE PO LIQD
237.0000 mL | Freq: Two times a day (BID) | ORAL | Status: DC
  Administered 2022-12-13 (×2): 237 mL via ORAL

## 2022-12-13 MED ORDER — ADULT MULTIVITAMIN W/MINERALS CH
1.0000 | ORAL_TABLET | Freq: Every day | ORAL | Status: DC
  Administered 2022-12-13 – 2022-12-18 (×6): 1 via ORAL
  Filled 2022-12-13 (×6): qty 1

## 2022-12-13 NOTE — Progress Notes (Signed)
Orthopedic Tech Progress Note Patient Details:  Erica Ball [birth year] [id number]  Ortho Devices Type of Ortho Device: Postop shoe/boot Ortho Device/Splint Location: right Ortho Device/Splint Interventions: Ordered, Application, Adjustment   Post Interventions Patient Tolerated: Fair Instructions Provided: Adjustment of device, Care of device  Kizzie Fantasia 12/13/2022, 10:49 AM

## 2022-12-13 NOTE — Evaluation (Signed)
Physical Therapy Evaluation Patient Details Name: Erica Ball MRN: [id number] DOB: [birth year] Today's Date: 12/13/2022  History of Present Illness  72 year old with past medical history significant for hyponatremia secondary to SIADH, hypokalemia, UTI, history of alcohol abuse, hypertension, history of SVT in the setting of sepsis, hyperlipidemia, left knee fracture recent admission for hyponatremia discharged on sodium tablet, urea and Lasix.  Sent back from skilled nursing facility due to recurrent hyponatremia sodium level down to 19 at the rehab.  Sodium check in the ED was 124.  Clinical Impression  Pt admitted with above diagnosis. Pt transferred bed to bedside commode then to bed with Rw and min to mod assist. Pt requires verbal cues for safety, she attempted to sit on bed prior to fully reaching it during transfer and had poorly controlled descent to the bed requiring mod A to prevent a fall. SpO2 93% on room air.  Pt currently with functional limitations due to the deficits listed below (see PT Problem List). Pt will benefit from acute skilled PT to increase their independence and safety with mobility to allow discharge.           If plan is discharge home, recommend the following: Assist for transportation;Help with stairs or ramp for entrance;A lot of help with walking and/or transfers;Assistance with cooking/housework   Can travel by private vehicle   No    Equipment Recommendations None recommended by PT  Recommendations for Other Services       Functional Status Assessment Patient has had a recent decline in their functional status and demonstrates the ability to make significant improvements in function in a reasonable and predictable amount of time.     Precautions / Restrictions Precautions Precautions: Fall Required Braces or Orthoses: Knee Immobilizer - Left Knee Immobilizer - Left: On at all times Restrictions Weight Bearing Restrictions: Yes RLE Weight  Bearing: Weight bearing as tolerated LLE Weight Bearing: Non weight bearing      Mobility  Bed Mobility Overal bed mobility: Independent, Modified Independent Bed Mobility: Supine to Sit Rolling: Modified independent (Device/Increase time), Used rails   Supine to sit: HOB elevated, Modified independent (Device/Increase time)          Transfers Overall transfer level: Needs assistance Equipment used: Rolling walker (2 wheels) Transfers: Sit to/from Stand, Bed to chair/wheelchair/BSC Sit to Stand: Contact guard assist Stand pivot transfers: Min assist, Mod assist         General transfer comment: No VC for hand placement while using RW. Pt was able to maintain NWB restrictions to LLE. Stand pivot transfer x 2, pt requires mod A to guide hips to bed with stand to sit 2* poorly controlled descent and sitting prior to reaching bed despite verbal cues to not sit until she'd fully reached the bed.    Ambulation/Gait                  Stairs            Wheelchair Mobility     Tilt Bed    Modified Rankin (Stroke Patients Only)       Balance Overall balance assessment: Needs assistance, History of Falls Sitting-balance support: No upper extremity supported, Feet supported Sitting balance-Leahy Scale: Fair Sitting balance - Comments: sitting EOB   Standing balance support: During functional activity, Bilateral upper extremity supported, Reliant on assistive device for balance Standing balance-Leahy Scale: Poor Standing balance comment: reliant on RW for support  Pertinent Vitals/Pain Pain Assessment Pain Assessment: No/denies pain Pain Score: 0-No pain Faces Pain Scale: No hurt    Home Living Family/patient expects to be discharged to:: Skilled nursing facility Living Arrangements: Alone Available Help at Discharge: Family;Available PRN/intermittently Type of Home: Apartment Home Access: Level entry        Home Layout: One level Home Equipment: Grab bars - tub/shower;BSC/3in1;Rolling Walker (2 wheels);Rollator (4 wheels);Cane - single point Additional Comments: Brother helps as needed.    Prior Function Prior Level of Function : History of Falls (last six months)             Mobility Comments: Before recent SNF admission she was using rollator in apartment. Multiple falls reported, which pt attributes to alcohol. At SNF pt has been working on transfers and mobilizing herself in [facility] ADLs Comments: At home Brother would assist getting up after fall, states she could perform bath/dress herself. Cooks own meals. Since SNF requires assist with all ADLs.     Extremity/Trunk Assessment   Upper Extremity Assessment Upper Extremity Assessment: Defer to OT evaluation    Lower Extremity Assessment Lower Extremity Assessment: RLE deficits/detail;LLE deficits/detail RLE Deficits / Details: Bruising noted at 2nd, 3rd, and 4th metatarsal with pain verbalized with WB. Post up shoe utilized for transfers. RLE Sensation: WNL LLE Deficits / Details: In L knee immoblizer. Recent left knee fracture LLE Sensation: WNL    Cervical / Trunk Assessment Cervical / Trunk Assessment: Normal  Communication   Communication Communication: No apparent difficulties  Cognition Arousal: Alert Behavior During Therapy: WFL for tasks assessed/performed Overall Cognitive Status: Within Functional Limits for tasks assessed                                          General Comments      Exercises     Assessment/Plan    PT Assessment Patient needs continued PT services  PT Problem List Decreased strength;Decreased range of motion;Decreased activity tolerance;Decreased mobility;Decreased balance;Decreased knowledge of use of DME;Pain;Decreased knowledge of precautions;Decreased safety awareness;Decreased cognition       PT Treatment Interventions DME instruction;Gait training;Functional  mobility training;Therapeutic activities;Balance training;Therapeutic exercise;Neuromuscular re-education;Cognitive remediation;Patient/family education;Wheelchair mobility training;Modalities    PT Goals (Current goals can be found in the Care Plan section)  Acute Rehab PT Goals Patient Stated Goal: Back to rehab, regain independence PT Goal Formulation: With patient Time For Goal Achievement: 12/01/22 Potential to Achieve Goals: Fair    Frequency Min 1X/week     Co-evaluation               AM-PAC PT "6 Clicks" Mobility  Outcome Measure Help needed turning from your back to your side while in a flat bed without using bedrails?: None Help needed moving from lying on your back to sitting on the side of a flat bed without using bedrails?: A Little Help needed moving to and from a bed to a chair (including a wheelchair)?: A Lot Help needed standing up from a chair using your arms (e.g., wheelchair or bedside chair)?: A Little Help needed to walk in hospital room?: Total Help needed climbing 3-5 steps with a railing? : Total 6 Click Score: 14    End of Session Equipment Utilized During Treatment: Gait belt;Left knee immobilizer Activity Tolerance: Patient limited by fatigue Patient left: in bed;with call bell/phone within reach;with bed alarm set;with nursing/sitter in room Nurse Communication: Mobility status PT  Visit Diagnosis: Unsteadiness on feet (R26.81);Muscle weakness (generalized) (M62.81);History of falling (Z91.81);Difficulty in walking, not elsewhere classified (R26.2)    Time: 3086-5784 PT Time Calculation (min) (ACUTE ONLY): 20 min   Charges:   PT Evaluation $PT Eval Moderate Complexity: 1 Mod   PT General Charges $$ ACUTE PT VISIT: 1 Visit        Tamala Ser PT 12/13/2022  Acute Rehabilitation Services  Office [phone]

## 2022-12-13 NOTE — Evaluation (Signed)
Occupational Therapy Evaluation Patient Details Name: Erica Ball MRN: [id number] DOB: 08/24/1950 Today's Date: 12/13/2022   History of Present Illness 72 year old with past medical history significant for hyponatremia secondary to SIADH, hypokalemia, UTI, history of alcohol abuse, hypertension, history of SVT in the setting of sepsis, hyperlipidemia, left knee fracture recent admission for hyponatremia discharged on sodium tablet, urea and Lasix.  Sent back from skilled nursing facility due to recurrent hyponatremia sodium level down to 19 at the rehab.  Sodium check in the ED was 124.   Clinical Impression   Pt admitted with the above diagnosis. Pt currently with functional limitations due to the deficits listed below (see OT Problem List). Prior to admit, pt was a short term resident at [facility] receiving rehab d/t recent left knee fracture. Pt has made progress in functional transfers and sit<>stand transitions. Recommend pt return to SNF when appropriate to continue short term rehab. Pt will benefit from acute skilled OT to increase their safety and independence with ADL and functional mobility for ADL to facilitate discharge. Noted right foot pain during WB and transfer. Pt with bruising at 2nd, 3rd, and 4th metatarsals. 6/18: imaging performed. X-ray + fractures although CT was negative. Ortho recommended post op shoe for comfort. Dr. Sunnie Nielsen made aware of foot pain and agreed to order for post op shoe. OT placed order this AM.         If plan is discharge home, recommend the following: A little help with walking and/or transfers;A little help with bathing/dressing/bathroom;Help with stairs or ramp for entrance;Assist for transportation    Functional Status Assessment  Patient has had a recent decline in their functional status and demonstrates the ability to make significant improvements in function in a reasonable and predictable amount of time.  Equipment Recommendations   Other (comment) (defer to next venue of care)    \\   Precautions / Restrictions Precautions Precautions: Fall Required Braces or Orthoses: Knee Immobilizer - Left Knee Immobilizer - Left: On at all times Restrictions Weight Bearing Restrictions: Yes LLE Weight Bearing: Non weight bearing      Mobility Bed Mobility Overal bed mobility: Independent Bed Mobility: Sit to Supine       Sit to supine: Supervision   General bed mobility comments: Pt sitting up in recliner upon therapy arrival. No physical assist needed during bed mobility. Increased time needed. Pt able to lateral scoot to the left while seated EOB towards HOB before transitioning from sit to supine.    Transfers Overall transfer level: Needs assistance Equipment used: Rolling walker (2 wheels) Transfers: Sit to/from Stand, Bed to chair/wheelchair/BSC Sit to Stand: Contact guard assist Stand pivot transfers: Min assist         General transfer comment: No VC for hand placement while using RW. Pt was able to maintain NWB restrictions to LLE.      Balance Overall balance assessment: Needs assistance, History of Falls Sitting-balance support: No upper extremity supported, Feet supported Sitting balance-Leahy Scale: Fair Sitting balance - Comments: sitting EOB   Standing balance support: During functional activity, Bilateral upper extremity supported, Reliant on assistive device for balance Standing balance-Leahy Scale: Poor Standing balance comment: reliant on RW for support         ADL either performed or assessed with clinical judgement   ADL Overall ADL's : Needs assistance/impaired Eating/Feeding: Modified independent;Sitting   Grooming: Oral care;Brushing hair;Sitting;Wash/dry face;Set up   Upper Body Bathing: Set up;Sitting   Lower Body Bathing: Sit to/from stand;Minimal  assistance   Upper Body Dressing : Set up;Sitting   Lower Body Dressing: Moderate assistance;Sit to/from stand          Toileting - Clothing Manipulation Details (indicate cue type and reason): utilizing pure wick for urgency and decreased sensation             Vision Baseline Vision/History: 1 Wears glasses Ability to See in Adequate Light: 0 Adequate Patient Visual Report: No change from baseline       Perception Perception: Not tested       Praxis Praxis: Not tested       Pertinent Vitals/Pain Pain Assessment Pain Assessment: Faces Faces Pain Scale: Hurts little more Pain Location: right foot when standing and weightbearing/pivoting during transfer. Pain Descriptors / Indicators: Grimacing, Discomfort, Sore Pain Intervention(s): Monitored during session     Extremity/Trunk Assessment Upper Extremity Assessment Upper Extremity Assessment: Overall WFL for tasks assessed   Lower Extremity Assessment Lower Extremity Assessment: Defer to PT evaluation RLE Deficits / Details: Bruising noted at 2nd, 3rd, and 4th metatarsal with pain verbalized with WB. LLE Deficits / Details: In L knee immoblizer. Recent left knee fracture   Cervical / Trunk Assessment Cervical / Trunk Assessment: Normal   Communication Communication Communication: No apparent difficulties   Cognition Arousal: Alert Behavior During Therapy: WFL for tasks assessed/performed Overall Cognitive Status: Within Functional Limits for tasks assessed                General Comments  Noted bruising at 2nd, 3rd, and 4th metarsals of right foot. Pt reports pain during standing and weightbearing. X-ray and CT were performed on 6/18 during admission. X-ray results indicated acute fractures. CT results indicated no acute fractures. Hospitalist and Nursing made aware.            Home Living Family/patient expects to be discharged to:: Skilled nursing facility Living Arrangements: Alone Available Help at Discharge: Family;Available PRN/intermittently Type of Home: Apartment Home Access: Level entry     Home Layout: One  level     Bathroom Shower/Tub: Chief Strategy Officer: Standard Bathroom Accessibility: Yes How Accessible: Accessible via walker Home Equipment: Grab bars - tub/shower;BSC/3in1;Rolling Walker (2 wheels);Rollator (4 wheels);Cane - single point   Additional Comments: Brother helps as needed.  Lives With: Alone    Prior Functioning/Environment Prior Level of Function : History of Falls (last six months)    Mobility Comments: Before recent SNF admission she was using rollator in apartment. Multiple falls reported. At SNF pt has been working on transfers and mobilizing herself in [facility] ADLs Comments: At home Brother would assist getting up after fall, states she could perform bath/dress herself. Cooks own meals. Since SNF requires assist with all ADLs.        OT Problem List: Decreased strength;Decreased activity tolerance;Impaired balance (sitting and/or standing);Decreased safety awareness;Decreased range of motion;Pain      OT Treatment/Interventions: Self-care/ADL training;Balance training;DME and/or AE instruction;Therapeutic activities;Patient/family education;Therapeutic exercise;Energy conservation;Manual therapy;Modalities    OT Goals(Current goals can be found in the care plan section) Acute Rehab OT Goals Patient Stated Goal: to get back to bed OT Goal Formulation: Patient unable to participate in goal setting Time For Goal Achievement: 12/27/22 Potential to Achieve Goals: Good  OT Frequency: Min 1X/week       AM-PAC OT "6 Clicks" Daily Activity     Outcome Measure Help from another person eating meals?: None Help from another person taking care of personal grooming?: A Little Help from another person toileting,  which includes using toliet, bedpan, or urinal?: A Lot Help from another person bathing (including washing, rinsing, drying)?: A Lot Help from another person to put on and taking off regular upper body clothing?: A Little Help from another person to  put on and taking off regular lower body clothing?: A Lot 6 Click Score: 16   End of Session Equipment Utilized During Treatment: Gait belt;Rolling walker (2 wheels);Left knee immobilizer Nurse Communication: Mobility status;Weight bearing status;Other (comment) (right foot pain)  Activity Tolerance: Patient tolerated treatment well Patient left: in bed;with call bell/phone within reach;with bed alarm set;with nursing/sitter in room  OT Visit Diagnosis: Unsteadiness on feet (R26.81);Other abnormalities of gait and mobility (R26.89);History of falling (Z91.81);Muscle weakness (generalized) (M62.81) Pain - Right/Left: Right Pain - part of body: Ankle and joints of foot                Time: 6045-4098 OT Time Calculation (min): 36 min Charges:  OT General Charges $OT Visit: 1 Visit OT Evaluation $OT Eval Moderate Complexity: 1 Mod OT Treatments $Self Care/Home Management : 8-22 mins  Limmie Patricia, OTR/L,CBIS  Supplemental OT - [facility] and [facility] Secure Chat Preferred    Ersa Delaney, Charisse March 12/13/2022, 10:43 AM

## 2022-12-13 NOTE — Plan of Care (Signed)
  Problem: Coping: Goal: Level of anxiety will decrease Outcome: Progressing   Problem: Safety: Goal: Ability to remain free from injury will improve Outcome: Progressing   Problem: Skin Integrity: Goal: Risk for impaired skin integrity will decrease Outcome: Progressing   

## 2022-12-13 NOTE — TOC Progression Note (Signed)
Transition of Care [facility]) - Progression Note    Patient Details  Name: Erica Ball MRN: [id number] Date of Birth: 1950-07-31  Transition of Care [facility]) CM/SW Contact  Howell Rucks, RN Phone Number: 12/13/2022, 3:51 PM  Clinical Narrative:   PT eval completed, recommendation for short term rehab. FL2 completed, faxed out for bed offers, pt voiced preference of returning to [place]. TOC will continue to follow.     Expected Discharge Plan: Skilled Nursing Facility Barriers to Discharge: Continued Medical Work up  Expected Discharge Plan and Services   Discharge Planning Services: CM Consult   Living arrangements for the past 2 months: Single Family Home                                       Social Determinants of Health (SDOH) Interventions SDOH Screenings   Food Insecurity: No Food Insecurity (12/12/2022)  Housing: Low Risk  (12/12/2022)  Transportation Needs: No Transportation Needs (12/12/2022)  Utilities: Not At Risk (12/12/2022)  Alcohol Screen: Low Risk  (11/17/2022)  Depression (PHQ2-9): Low Risk  (11/17/2022)  Financial Resource Strain: Patient Unable To Answer (11/17/2022)  Physical Activity: Sufficiently Active (11/17/2022)  Social Connections: Socially Isolated (11/17/2022)  Stress: No Stress Concern Present (11/17/2022)  Tobacco Use: High Risk (12/11/2022)  Health Literacy: Inadequate Health Literacy (11/17/2022)    Readmission Risk Interventions    12/12/2022    4:34 PM  Readmission Risk Prevention Plan  Transportation Screening Complete  PCP or Specialist Appt within 3-5 Days Complete  HRI or Home Care Consult Complete  Social Work Consult for Recovery Care Planning/Counseling Complete  Palliative Care Screening Not Applicable  Medication Review Oceanographer) Complete

## 2022-12-13 NOTE — Progress Notes (Signed)
Initial Nutrition Assessment  INTERVENTION:   -Ensure Plus High Protein po BID, each supplement provides 350 kcal and 20 grams of protein.   -Multivitamin with minerals daily  -Weigh patient for admission  NUTRITION DIAGNOSIS:   Increased nutrient needs related to chronic illness as evidenced by estimated needs.  GOAL:   Patient will meet greater than or equal to 90% of their needs  MONITOR:   PO intake, Supplement acceptance, Labs, Weight trends, I & O's, Skin  REASON FOR ASSESSMENT:   Consult Assessment of nutrition requirement/status  ASSESSMENT:   72 year old with past medical history significant for hyponatremia secondary to SIADH, hypokalemia, UTI, history of alcohol abuse, hypertension, history of SVT in the setting of sepsis, hyperlipidemia, left knee fracture recent admission for hyponatremia discharged on sodium tablet, urea and Lasix.  Sent back from skilled nursing facility due to recurrent hyponatremia sodium level down to 19 at the rehab.  Sodium check in the ED was 124.  Unable to speak with patient today given at time of visit, multiple staff members entering pt's room. Will speak with pt at later time if remains admitted.  Pt recently seen but nutrition team at [facility]. At that time, pt was not eating well with hyponatremia. Pt was on regular diet with fluid restriction, had Ensure and daily MVI ordered. PT with history of alcohol abuse so would continue daily vitamin regimen.  Per chart review, pt's weight recorded the exact same as on 7/19, 128 lbs. Likely needs updated weight. Will order.  Medications: Folic acid, Lasix, MAG-OX, sodium packets, Zofran  Labs reviewed: Low Na   NUTRITION - FOCUSED PHYSICAL EXAM:  Unable to complete at this time.  Diet Order:   Diet Order             Diet regular Room service appropriate? Yes; Fluid consistency: Thin; Fluid restriction: Other (see comments)  Diet effective now                   EDUCATION NEEDS:    No education needs have been identified at this time  Skin:  Skin Integrity Issues:: Stage I Stage I: sacrum  Last BM:  PTA  Height:   Ht Readings from Last 1 Encounters:  12/11/22 5\' 8"  (1.727 m)    Weight:   Wt Readings from Last 1 Encounters:  12/11/22 58.5 kg    BMI:  Body mass index is 19.61 kg/m.  Estimated Nutritional Needs:   Kcal:  1750-1950  Protein:  85-95g  Fluid:  Fluid restriction per MD, current 1L  Tilda Franco, MS, RD, LDN Inpatient Clinical Dietitian Contact information available via Amion

## 2022-12-13 NOTE — Progress Notes (Signed)
PROGRESS NOTE    Erica Ball  ZOX:[id number] DOB: 01/27/1951 DOA: 12/11/2022 PCP: Patient, No Pcp Per   Brief Narrative: 72 year old with past medical history significant for hyponatremia secondary to SIADH, hypokalemia, UTI, history of alcohol abuse, hypertension, history of SVT in the setting of sepsis, hyperlipidemia, left knee fracture recent admission for hyponatremia discharged on sodium tablet, urea and Lasix.  Sent back from skilled nursing facility due to recurrent hyponatremia sodium level down to 19 at the rehab.  Sodium check in the ED was 124. Repeated sodium this morning 130.  Will continue with water restriction resume urea and sodium tablets and Lasix.  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  Repeated sodium in the ED was 124.  Sodium has increased to 130 with water restriction. -Case discussed with nephrology on-call, plan to resume sodium tablets, urea and Lasix.  Continue to monitor sodium.  -Patient refuse Urea yesterday-- Sodium down to 129.  -continue with water restriction , change to 1 L. She agrees to take urea today.  Plan to continue to monitor sodium level.    2-Hypertension: Resume Toprol, Lasix and hydralazine  COPD: Stable continue with nebulizers Alcohol abuse,history of currently in remission Hypokalemia; Replaced.       Estimated body mass index is 18.03 kg/m as calculated from the following:   Height as of this encounter: 5\' 8"  (1.727 m).   Weight as of this encounter: 53.8 kg.   DVT prophylaxis: Lovenox Code Status: Full code Family Communication:Son Updated. 8/07 Disposition Plan:   Status is: Inpatient Remains inpatient appropriate because: transfer back to rehab 8/8    Consultants:    Procedures:    Antimicrobials:    Subjective: She is alert, we discussed water restriction and importance of taking urea/  She refuse urea yesterday. Willing to take it today.  We discussed Gatorade is ok,  but not to exceed fluid restriction.   Objective: Vitals:   12/13/22 0543 12/13/22 1010 12/13/22 1205 12/13/22 1334  BP: 121/68 121/67 120/64   Pulse: 69 66 77   Resp: 20 18 15    Temp: 97.7 F (36.5 C) 98.1 F (36.7 C) 98.3 F (36.8 C)   TempSrc: Oral Oral Oral   SpO2: 93% 96% 92%   Weight:   57 kg 53.8 kg  Height:        Intake/Output Summary (Last 24 hours) at 12/13/2022 1338 Last data filed at 12/13/2022 8119 Gross per 24 hour  Intake 500 ml  Output 1000 ml  Net -500 ml   Filed Weights   12/11/22 2213 12/13/22 1205 12/13/22 1334  Weight: 58.5 kg 57 kg 53.8 kg    Examination:  General exam: NAD Respiratory system: CTAa Cardiovascular system: S 1, S 2 RRR Gastrointestinal system: BS present, soft, nt Central nervous system: alert Extremities: no edema    Data Reviewed: I have personally reviewed following labs and imaging studies  CBC: Recent Labs  Lab 12/11/22 2219 12/12/22 0529  WBC 6.8 6.3  NEUTROABS 4.6  --   HGB 11.2* 11.4*  HCT 31.6* 33.0*  MCV 94.6 95.4  PLT 306 344  Basic Metabolic Panel: Recent Labs  Lab 12/11/22 2219 12/12/22 0529 12/12/22 0936 12/12/22 1538 12/12/22 2216 12/13/22 0509 12/13/22 1022  NA 124* 130* 132* 130* 126* 129* 128*  K 3.8 3.6 3.1* 3.5 4.0 4.1 4.8  CL 87* 93* 94* 91* 90* 94* 91*  CO2 27 30 28 30 28 27 28   GLUCOSE 96 85 87 145* 112* 89 105*  BUN 39* 25* 21 21 22 16 18   CREATININE 0.58 0.50 0.52 0.71 0.67 0.59 0.76  CALCIUM 9.1 8.9 8.8* 8.9 8.6* 8.8* 8.9  MG 2.0 1.9  --   --   --   --   --   PHOS 3.7 4.1  --   --   --   --   --    GFR: Estimated Creatinine Clearance: 53.2 mL/min (by C-G  formula based on SCr of 0.76 mg/dL). Liver Function Tests: Recent Labs  Lab 12/11/22 2219 12/12/22 0529  AST 17 17  ALT 16 14  ALKPHOS 56 46  BILITOT 0.8 0.7  PROT 7.0 6.5  ALBUMIN 3.8 3.5   No results for input(s): "LIPASE", "AMYLASE" in the last 168 hours. Recent Labs  Lab 12/12/22 0018  AMMONIA 11   Coagulation Profile: Recent Labs  Lab 12/12/22 0018  INR 1.0   Cardiac Enzymes: Recent Labs  Lab 12/11/22 2219  CKTOTAL 62   BNP (last 3 results) No results for input(s): "PROBNP" in the last 8760 hours. HbA1C: No results for input(s): "HGBA1C" in the last 72 hours. CBG: No results for input(s): "GLUCAP" in the last 168 hours. Lipid Profile: No results for input(s): "CHOL", "HDL", "LDLCALC", "TRIG", "CHOLHDL", "LDLDIRECT" in the last 72 hours. Thyroid Function Tests: Recent Labs    12/11/22 2219  TSH 2.330   Anemia Panel: Recent Labs    12/12/22 0936  VITAMINB12 599   Sepsis Labs: No results for input(s): "PROCALCITON", "LATICACIDVEN" in the last 168 hours.  No results found for this or any previous visit (from the past 240 hour(s)).       Radiology Studies: DG CHEST PORT 1 VIEW  Result Date: 12/12/2022 CLINICAL DATA:  Hyponatremia. EXAM: PORTABLE CHEST 1 VIEW COMPARISON:  November 16, 2022 FINDINGS: The heart size and mediastinal contours are within normal limits. There is marked severity calcification of the thoracic aorta. Both lungs are clear. A chronic appearing deformity is seen involving the distal right clavicle. Multilevel degenerative changes are seen throughout the thoracic spine. IMPRESSION: No active cardiopulmonary disease. Electronically Signed   By: Aram Candela M.D.   On: 12/12/2022 00:20        Scheduled Meds:  aspirin EC  81 mg Oral Daily   enoxaparin (LOVENOX) injection  40 mg Subcutaneous Daily   folic acid  1 mg Oral Daily   furosemide  40 mg Oral Daily   hydrALAZINE  25 mg Oral TID   magnesium oxide  400 mg Oral Daily    metoprolol succinate  100 mg Oral Daily   multivitamin with minerals  1 tablet Oral Daily   pantoprazole  40 mg Oral Daily   rosuvastatin  10 mg Oral Daily   sodium chloride flush  3 mL Intravenous Q12H   sodium chloride  1 g Oral BID WC   urea  15 g Oral QHS   urea  30 g Oral Daily   Continuous Infusions:  sodium chloride       LOS: 1 day    Time spent: 35 minutes    Erica Beal A Diavian Furgason,  MD [facility]   If 7PM-7AM, please contact night-coverage [email or web address]  12/13/2022, 1:38 PM

## 2022-12-13 NOTE — NC FL2 (Signed)
[place] MEDICAID [place] LEVEL OF CARE FORM     IDENTIFICATION  Patient Name: Erica Ball Birthdate: 1950/12/15 Sex: female Admission Date (Current Location): 12/11/2022  [facility] and [place] Number:  Producer, television/film/video and Address:  [facility],  [address] [place], [place] [address]      Provider Number: [id number]  Attending Physician Name and Address:  Alba Cory, MD  Relative Name and Phone Number:  Nehemiah Settle (sister) [phone]    Current Level of Care: Hospital Recommended Level of Care: Skilled Nursing Facility Prior Approval Number:    Date Approved/Denied:   PASRR Number: [id number]  Discharge Plan: SNF    Current Diagnoses: Patient Active Problem List   Diagnosis Date Noted   Protein-calorie malnutrition, severe 11/23/2022   Generalized weakness 11/21/2022   SVT (supraventricular tachycardia) 11/04/2022   Demand ischemia 11/04/2022   Malnutrition of moderate degree 10/31/2022   Tibial plateau fracture 10/24/2022   Hyponatremia 10/24/2022   Hypomagnesemia 10/24/2022   Tibial plateau fracture, left, closed, initial encounter 10/23/2022   Fall at home, initial encounter 10/23/2022   COPD GOLD 0 / still smoking  12/02/2017   Elevated troponin 07/10/2017   Pleural effusion on right 07/10/2017   UTI (urinary tract infection) 07/10/2017   Sepsis (HCC) 07/10/2017   Lobar pneumonia (HCC) 07/10/2017   Acute kidney injury (HCC) 07/10/2017   Acute respiratory failure with hypoxia (HCC) 07/10/2017   Alcohol abuse    Tobacco abuse 08/20/2014   Hypertension 10/30/2011   Diverticulitis 10/30/2011   Vitamin D deficiency 10/30/2011    Orientation RESPIRATION BLADDER Height & Weight     Self, Time, Situation, Place  Normal Incontinent, External catheter Weight: 53.8 kg (I re-weighted this pt because last weight included SCD machines, tele box, and two extra pillows.) Height:  5\' 8"  (172.7 cm)  BEHAVIORAL SYMPTOMS/MOOD NEUROLOGICAL  BOWEL NUTRITION STATUS      Continent Diet (regular)  AMBULATORY STATUS COMMUNICATION OF NEEDS Skin   Extensive Assist Verbally PU Stage and Appropriate Care (stage 1 sacrum pressure injury, foam dressing prn) PU Stage 1 Dressing:  (prn)                     Personal Care Assistance Level of Assistance  Bathing, Feeding, Dressing Bathing Assistance: Limited assistance Feeding assistance: Limited assistance Dressing Assistance: Limited assistance     Functional Limitations Info  Sight, Hearing, Speech Sight Info: Impaired (eyeglasses) Hearing Info: Impaired (hard of hearing) Speech Info: Adequate    SPECIAL CARE FACTORS FREQUENCY  PT (By licensed PT), OT (By licensed OT)     PT Frequency: 5x/wk OT Frequency: 5x/wk            Contractures Contractures Info: Not present    Additional Factors Info  Code Status, Allergies, Psychotropic Code Status Info: Full code Allergies Info: Dextromethorphan Psychotropic Info: N/A         Current Medications (12/13/2022):  This is the current hospital active medication list Current Facility-Administered Medications  Medication Dose Route Frequency Provider Last Rate Last Admin   0.9 %  sodium chloride infusion  250 mL Intravenous PRN Doutova, Anastassia, MD       acetaminophen (TYLENOL) tablet 650 mg  650 mg Oral Q6H PRN Therisa Doyne, MD   650 mg at 12/12/22 6295   Or   acetaminophen (TYLENOL) suppository 650 mg  650 mg Rectal Q6H PRN Doutova, Anastassia, MD       albuterol (PROVENTIL) (2.5 MG/3ML) 0.083% nebulizer  solution 2.5 mg  2.5 mg Nebulization Q4H PRN Therisa Doyne, MD       aspirin EC tablet 81 mg  81 mg Oral Daily Doutova, Anastassia, MD   81 mg at 12/13/22 1011   enoxaparin (LOVENOX) injection 40 mg  40 mg Subcutaneous Daily Regalado, Belkys A, MD   40 mg at 12/13/22 1026   folic acid (FOLVITE) tablet 1 mg  1 mg Oral Daily Doutova, Anastassia, MD   1 mg at 12/13/22 1011   furosemide (LASIX) tablet 40 mg  40  mg Oral Daily Regalado, Belkys A, MD   40 mg at 12/13/22 1011   hydrALAZINE (APRESOLINE) tablet 25 mg  25 mg Oral TID Regalado, Belkys A, MD   25 mg at 12/13/22 1010   HYDROcodone-acetaminophen (NORCO/VICODIN) 5-325 MG per tablet 1-2 tablet  1-2 tablet Oral Q4H PRN Therisa Doyne, MD       magnesium oxide (MAG-OX) tablet 400 mg  400 mg Oral Daily Doutova, Anastassia, MD   400 mg at 12/13/22 1011   metoprolol succinate (TOPROL-XL) 24 hr tablet 100 mg  100 mg Oral Daily Doutova, Anastassia, MD   100 mg at 12/13/22 1010   multivitamin with minerals tablet 1 tablet  1 tablet Oral Daily Regalado, Belkys A, MD   1 tablet at 12/13/22 1035   ondansetron (ZOFRAN) tablet 4 mg  4 mg Oral Q6H PRN Therisa Doyne, MD       Or   ondansetron (ZOFRAN) injection 4 mg  4 mg Intravenous Q6H PRN Doutova, Anastassia, MD   4 mg at 12/13/22 1035   Oral care mouth rinse  15 mL Mouth Rinse PRN Regalado, Belkys A, MD       pantoprazole (PROTONIX) EC tablet 40 mg  40 mg Oral Daily Doutova, Anastassia, MD   40 mg at 12/13/22 1011   rosuvastatin (CRESTOR) tablet 10 mg  10 mg Oral Daily Doutova, Anastassia, MD   10 mg at 12/13/22 1011   sodium chloride flush (NS) 0.9 % injection 3 mL  3 mL Intravenous Q12H Doutova, Anastassia, MD   3 mL at 12/13/22 1013   sodium chloride flush (NS) 0.9 % injection 3 mL  3 mL Intravenous PRN Doutova, Anastassia, MD       sodium chloride tablet 1 g  1 g Oral BID WC Regalado, Belkys A, MD   1 g at 12/13/22 1010   urea (URE-NA) oral packet 15 g  15 g Oral QHS Regalado, Belkys A, MD       urea (URE-NA) oral packet 30 g  30 g Oral Daily Regalado, Belkys A, MD   30 g at 12/13/22 1011     Discharge Medications: Please see discharge summary for a list of discharge medications.  Relevant Imaging Results:  Relevant Lab Results:   Additional Information SSN: [id number]  Howell Rucks, RN

## 2022-12-14 ENCOUNTER — Inpatient Hospital Stay (HOSPITAL_COMMUNITY): Payer: Medicare HMO

## 2022-12-14 ENCOUNTER — Observation Stay (HOSPITAL_COMMUNITY): Payer: Medicare HMO

## 2022-12-14 DIAGNOSIS — S82892D Other fracture of left lower leg, subsequent encounter for closed fracture with routine healing: Secondary | ICD-10-CM | POA: Diagnosis not present

## 2022-12-14 DIAGNOSIS — E785 Hyperlipidemia, unspecified: Secondary | ICD-10-CM | POA: Diagnosis present

## 2022-12-14 DIAGNOSIS — F1010 Alcohol abuse, uncomplicated: Secondary | ICD-10-CM | POA: Diagnosis present

## 2022-12-14 DIAGNOSIS — F17210 Nicotine dependence, cigarettes, uncomplicated: Secondary | ICD-10-CM | POA: Diagnosis present

## 2022-12-14 DIAGNOSIS — J4489 Other specified chronic obstructive pulmonary disease: Secondary | ICD-10-CM | POA: Diagnosis present

## 2022-12-14 DIAGNOSIS — Z993 Dependence on wheelchair: Secondary | ICD-10-CM | POA: Diagnosis not present

## 2022-12-14 DIAGNOSIS — Z888 Allergy status to other drugs, medicaments and biological substances status: Secondary | ICD-10-CM | POA: Diagnosis not present

## 2022-12-14 DIAGNOSIS — Z79899 Other long term (current) drug therapy: Secondary | ICD-10-CM | POA: Diagnosis not present

## 2022-12-14 DIAGNOSIS — E861 Hypovolemia: Secondary | ICD-10-CM | POA: Diagnosis present

## 2022-12-14 DIAGNOSIS — M8580 Other specified disorders of bone density and structure, unspecified site: Secondary | ICD-10-CM | POA: Diagnosis present

## 2022-12-14 DIAGNOSIS — E871 Hypo-osmolality and hyponatremia: Secondary | ICD-10-CM | POA: Diagnosis present

## 2022-12-14 DIAGNOSIS — G8929 Other chronic pain: Secondary | ICD-10-CM | POA: Diagnosis present

## 2022-12-14 DIAGNOSIS — T502X5A Adverse effect of carbonic-anhydrase inhibitors, benzothiadiazides and other diuretics, initial encounter: Secondary | ICD-10-CM | POA: Diagnosis present

## 2022-12-14 DIAGNOSIS — E876 Hypokalemia: Secondary | ICD-10-CM | POA: Diagnosis present

## 2022-12-14 DIAGNOSIS — I110 Hypertensive heart disease with heart failure: Secondary | ICD-10-CM | POA: Diagnosis present

## 2022-12-14 DIAGNOSIS — S82142D Displaced bicondylar fracture of left tibia, subsequent encounter for closed fracture with routine healing: Secondary | ICD-10-CM | POA: Diagnosis not present

## 2022-12-14 DIAGNOSIS — E222 Syndrome of inappropriate secretion of antidiuretic hormone: Secondary | ICD-10-CM | POA: Diagnosis present

## 2022-12-14 DIAGNOSIS — I5032 Chronic diastolic (congestive) heart failure: Secondary | ICD-10-CM | POA: Diagnosis present

## 2022-12-14 DIAGNOSIS — Z823 Family history of stroke: Secondary | ICD-10-CM | POA: Diagnosis not present

## 2022-12-14 DIAGNOSIS — Z8249 Family history of ischemic heart disease and other diseases of the circulatory system: Secondary | ICD-10-CM | POA: Diagnosis not present

## 2022-12-14 DIAGNOSIS — Z7982 Long term (current) use of aspirin: Secondary | ICD-10-CM | POA: Diagnosis not present

## 2022-12-14 DIAGNOSIS — W19XXXD Unspecified fall, subsequent encounter: Secondary | ICD-10-CM | POA: Diagnosis present

## 2022-12-14 DIAGNOSIS — E860 Dehydration: Secondary | ICD-10-CM | POA: Diagnosis present

## 2022-12-14 LAB — BASIC METABOLIC PANEL
Anion gap: 9 (ref 5–15)
BUN: 76 mg/dL — ABNORMAL HIGH (ref 8–23)
CO2: 28 mmol/L (ref 22–32)
Calcium: 9.1 mg/dL (ref 8.9–10.3)
Chloride: 91 mmol/L — ABNORMAL LOW (ref 98–111)
Creatinine, Ser: 0.75 mg/dL (ref 0.44–1.00)
GFR, Estimated: >60 mL/min (ref >60)
Glucose, Bld: 120 mg/dL — ABNORMAL HIGH (ref 70–99)
Potassium: 4.4 mmol/L (ref 3.5–5.1)
Sodium: 128 mmol/L — ABNORMAL LOW (ref 135–145)

## 2022-12-14 LAB — SODIUM
Sodium: 127 mmol/L — ABNORMAL LOW (ref 135–145)
Sodium: 130 mmol/L — ABNORMAL LOW (ref 135–145)

## 2022-12-14 MED ORDER — UREA 15 G PO PACK: 15.0000 g | PACK | Freq: Every day | ORAL | Status: DC

## 2022-12-14 MED ORDER — UREA 15 G PO PACK
15.0000 g | PACK | Freq: Two times a day (BID) | ORAL | Status: DC
  Administered 2022-12-14 – 2022-12-18 (×8): 15 g via ORAL
  Filled 2022-12-14 (×9): qty 1

## 2022-12-14 MED ORDER — SODIUM CHLORIDE 1 G PO TABS
2.0000 g | ORAL_TABLET | Freq: Two times a day (BID) | ORAL | Status: DC
  Administered 2022-12-14: 2 g via ORAL
  Filled 2022-12-14: qty 2

## 2022-12-14 MED ORDER — SODIUM CHLORIDE 1 G PO TABS: 1.0000 g | ORAL_TABLET | Freq: Two times a day (BID) | ORAL | Status: DC

## 2022-12-14 MED ORDER — SODIUM CHLORIDE 0.9 % IV SOLN: INTRAVENOUS | Status: AC

## 2022-12-14 NOTE — Progress Notes (Signed)
Mobility Specialist - Progress Note   12/14/22 1549  Mobility  Activity Transferred from chair to bed  Level of Assistance Contact guard assist, steadying assist  Assistive Device Front wheel walker  Distance Ambulated (ft) 2 ft  RLE Weight Bearing WBAT  LLE Weight Bearing NWB  Activity Response Tolerated well  Mobility Referral Yes  $Mobility charge 1 Mobility  Mobility Specialist Start Time (ACUTE ONLY) 0329  Mobility Specialist Stop Time (ACUTE ONLY) 0339  Mobility Specialist Time Calculation (min) (ACUTE ONLY) 10 min   Pt received in recliner and agreeable to transfer back to bed. Pt was minA from STS & CG during transfer. No complaints during session. Pt to bed after session with all needs met. Bed alarm on.      [facility]

## 2022-12-14 NOTE — TOC Progression Note (Addendum)
Transition of Care [facility]) - Progression Note    Patient Details  Name: Erica Ball MRN: [id number] Date of Birth: [birth year]  Transition of Care [facility]) CM/SW Contact  Howell Rucks, RN Phone Number: 12/14/2022, 10:03 AM  Clinical Narrative:  PT eval completed, recommend short term rehab-SNF. Met with pt at bedside, pt agreeable, confirmed she would like to return to [place]. Pt request NCM contact her son or sister for notification. NCM outreached to pt's son Fayrene Fearing), updated on PT recommendation for short term rehab, confirmed agreeable for pt to return to [facility]. James request call from attending for medical update. Teams chat sent to attending notifying of son's request for call. Will initiate insurance auth.    -1:01pm Insurance auth initiated via [place]. Auth ID [id number], await authorization.     Expected Discharge Plan: Skilled Nursing Facility Barriers to Discharge: Continued Medical Work up  Expected Discharge Plan and Services   Discharge Planning Services: CM Consult   Living arrangements for the past 2 months: Single Family Home                                       Social Determinants of Health (SDOH) Interventions SDOH Screenings   Food Insecurity: No Food Insecurity (12/12/2022)  Housing: Low Risk  (12/12/2022)  Transportation Needs: No Transportation Needs (12/12/2022)  Utilities: Not At Risk (12/12/2022)  Alcohol Screen: Low Risk  (11/17/2022)  Depression (PHQ2-9): Low Risk  (11/17/2022)  Financial Resource Strain: Patient Unable To Answer (11/17/2022)  Physical Activity: Sufficiently Active (11/17/2022)  Social Connections: Socially Isolated (11/17/2022)  Stress: No Stress Concern Present (11/17/2022)  Tobacco Use: High Risk (12/11/2022)  Health Literacy: Inadequate Health Literacy (11/17/2022)    Readmission Risk Interventions    12/12/2022    4:34 PM  Readmission Risk Prevention Plan  Transportation Screening Complete  PCP or Specialist Appt  within 3-5 Days Complete  HRI or Home Care Consult Complete  Social Work Consult for Recovery Care Planning/Counseling Complete  Palliative Care Screening Not Applicable  Medication Review [employer]) Complete

## 2022-12-14 NOTE — Progress Notes (Signed)
Mobility Specialist - Progress Note   12/14/22 1311  Mobility  Activity Transferred from bed to chair  Level of Assistance Contact guard assist, steadying assist  Assistive Device Front wheel walker  Distance Ambulated (ft) 2 ft  RLE Weight Bearing WBAT  LLE Weight Bearing NWB  Activity Response Tolerated well  Mobility Referral Yes  $Mobility charge 1 Mobility  Mobility Specialist Start Time (ACUTE ONLY) 1254  Mobility Specialist Stop Time (ACUTE ONLY) 1310  Mobility Specialist Time Calculation (min) (ACUTE ONLY) 16 min   Pt received in bed and agreeable to transfer to recliner. Pt was minA from STS & CG during transfer. No complaints during session. Pt to recliner after session with all needs met. Chair alarm on.     [facility]

## 2022-12-14 NOTE — Plan of Care (Signed)
  Problem: Coping: Goal: Level of anxiety will decrease Outcome: Progressing   Problem: Safety: Goal: Ability to remain free from injury will improve Outcome: Progressing   Problem: Skin Integrity: Goal: Risk for impaired skin integrity will decrease Outcome: Progressing   

## 2022-12-14 NOTE — Progress Notes (Signed)
PROGRESS NOTE    Erica Ball  ZOX:[id number] DOB: 18-Dec-1950 DOA: 12/11/2022 PCP: Patient, No Pcp Per   Brief Narrative: 72 year old with past medical history significant for hyponatremia secondary to SIADH, hypokalemia, UTI, history of alcohol abuse, hypertension, history of SVT in the setting of sepsis, hyperlipidemia, left knee fracture recent admission for hyponatremia discharged on sodium tablet, urea and Lasix.  Sent back from skilled nursing facility due to recurrent hyponatremia sodium level down to 19 at the rehab.  Sodium check in the ED was 124. Repeated sodium this morning 130.  Will continue with water restriction resume urea and sodium tablets and Lasix.  She was taking Gatorade at the facility will probably have to hold on that and just do water restriction Case discussed with nephrologist  Sodium down to 126--128. Nephrologist has been consulted.   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  Repeated sodium in the ED was 124.  Sodium has increased to 130 with water restriction. -Case discussed with nephrology on-call 8/07, plan to resume sodium tablets, urea and Lasix.  Continue to monitor sodium.  -Patient refused Urea on 8/7- Sodium down to 129. -Continue with water restriction , change to 1 L.  -She has been taking Urea. Sodium down to 126, BUN increased to 58. Nephrology has been consulted. Sodium Tablet increased to 2 gram.     2-Hypertension: Resume Toprol, Lasix and hydralazine  COPD: Stable continue with nebulizers Alcohol abuse,history of currently in remission Hypokalemia; Replaced.   Right Foot Bruise, toes, plan to get X ray.  Per son she didn't had bruise on that foot.  She did had a CT right foot was negative for fracture in June   Left  Closed Tibial Plateau Fracture,  Left Ankle Fracture.  Diagnosed during admission 6/23--7/01. Left Knee immobilizer and NWB on left.   Estimated body mass index is 18.03 kg/m as calculated from the following:   Height as of this encounter: 5\' 8"  (1.727 m).   Weight as of this encounter: 53.8 kg.   DVT prophylaxis: Lovenox Code Status: Full code Family Communication:Son Updated. 8/07 Disposition Plan:  Status is: Inpatient Remains inpatient appropriate because: transfer back to rehab 8/8    Consultants:    Procedures:    Antimicrobials:    Subjective: She is alert, conversant. She report some pain right foot,. She think she hi foot at the rehab.    Objective: Vitals:   12/13/22 2126 12/14/22 0428 12/14/22 0912 12/14/22 1128  BP: (!) 145/66 (!) 140/68 117/65 123/68  Pulse: 62 60 91 64  Resp:  18  16  Temp:  97.8 F (36.6 C)  97.8 F (36.6 C)  TempSrc:  Oral  Oral  SpO2:  93% 96% 98%  Weight:      Height:        Intake/Output Summary (Last 24 hours) at 12/14/2022 1318 Last data filed at 12/14/2022 1130 Gross per 24 hour  Intake 540 ml  Output 1200 ml  Net -660 ml   Filed Weights   12/11/22 2213 12/13/22 1205 12/13/22 1334  Weight: 58.5 kg 57 kg 53.8 kg    Examination:  General exam: NAD Respiratory system: CTA Cardiovascular system: S 1, S 2 RRR Gastrointestinal system: BS present, soft nt Central nervous system; Alert,  follows command Extremities: no edema    Data Reviewed: I have personally reviewed following labs and imaging studies  CBC: Recent Labs  Lab 12/11/22 2219 12/12/22 0529  WBC 6.8 6.3  NEUTROABS 4.6  --   HGB 11.2* 11.4*  HCT 31.6* 33.0*  MCV 94.6 95.4  PLT 306 344   Basic Metabolic Panel: Recent Labs  Lab 12/11/22 2219 12/12/22 0529 12/12/22 0936 12/13/22 0509 12/13/22 1022 12/13/22 1733 12/14/22 0522 12/14/22 1124  NA 124* 130*   < > 129* 128* 126* 126* 128*  K 3.8 3.6   < > 4.1 4.8 4.1 3.9 4.4  CL 87* 93*   <  > 94* 91* 91* 88* 91*  CO2 27 30   < > 27 28 27 28 28   GLUCOSE 96 85   < > 89 105* 167* 100* 120*  BUN 39* 25*   < > 16 18 58* 50* 76*  CREATININE 0.58 0.50   < > 0.59 0.76 0.81 0.74 0.75  CALCIUM 9.1 8.9   < > 8.8* 8.9 8.6* 8.8* 9.1  MG 2.0 1.9  --   --   --   --   --   --   PHOS 3.7 4.1  --   --   --   --   --   --    < > = values in this interval not displayed.   GFR: Estimated Creatinine Clearance: 53.2 mL/min (by C-G formula based on SCr of 0.75 mg/dL). Liver Function Tests: Recent Labs  Lab 12/11/22 2219 12/12/22 0529  AST 17 17  ALT 16 14  ALKPHOS 56 46  BILITOT 0.8 0.7  PROT 7.0 6.5  ALBUMIN 3.8 3.5   No results for input(s): "LIPASE", "AMYLASE" in the last 168 hours. Recent Labs  Lab 12/12/22 0018  AMMONIA 11   Coagulation Profile: Recent Labs  Lab 12/12/22 0018  INR 1.0   Cardiac Enzymes: Recent Labs  Lab 12/11/22 2219  CKTOTAL 62   BNP (last 3 results) No results for input(s): "PROBNP" in the last 8760 hours. HbA1C: No results for input(s): "HGBA1C" in the last 72 hours. CBG: No results for input(s): "GLUCAP" in the last 168 hours. Lipid Profile: No results for input(s): "CHOL", "HDL", "LDLCALC", "TRIG", "CHOLHDL", "LDLDIRECT" in the last 72 hours. Thyroid Function Tests: Recent Labs    12/11/22 2219  TSH 2.330   Anemia Panel: Recent Labs    12/12/22 0936  VITAMINB12 599   Sepsis Labs: No results for input(s): "PROCALCITON", "LATICACIDVEN" in the last 168 hours.  No results found for this or any previous visit (from the past 240 hour(s)).       Radiology Studies: No results found.      Scheduled Meds:  aspirin EC  81 mg Oral Daily   enoxaparin (LOVENOX) injection  40 mg Subcutaneous Daily   folic acid  1 mg Oral Daily   furosemide  40 mg Oral Daily   hydrALAZINE  25 mg Oral TID   magnesium oxide  400 mg Oral Daily   metoprolol succinate  100 mg Oral Daily   multivitamin with minerals  1 tablet Oral Daily   pantoprazole   40 mg Oral Daily   rosuvastatin  10 mg Oral Daily   sodium chloride flush  3 mL Intravenous Q12H   sodium chloride  2 g Oral BID WC   urea  15 g Oral QHS   urea  30 g Oral Daily   Continuous Infusions:  sodium  chloride       LOS: 1 day    Time spent: 35 minutes    Kira Hartl A Rayne Loiseau, MD [facility]   If 7PM-7AM, please contact night-coverage [email or web address]  12/14/2022, 1:18 PM

## 2022-12-14 NOTE — Consult Note (Addendum)
Renal Service Consult Note [facility]  Erica Ball 12/14/2022 Erica Krabbe, MD Requesting Physician: Dr. Sunnie Nielsen  Reason for Consult: Hyponatremia HPI: The patient is a 72 y.o. year-old w/ PMH as below who presented to sent from SNF for abnormal labs, especially hyponatremia. Pt has hx of SIADH, hypokalemia, UTI's, etoh abuse, HTN and SVT. Her last time in hospital here Na 119 in July of this year and she was discharged on 11/28/22 to the SNF.  This time no n/v/d, drinking Gatorade 3 / day at the SNF. This admit Na+ was 130 on admission and was down as low 126 early today and 128 later in the morning. We are asked to see for hyponatremia.   Pt was admitted in June for 10 days w/ Na+ 126 on admission, low UNa and low UOsm. She was abusing etoh. Her Na improved to 130 w/ IVF"s. Felt to be combination of etoh abuse and vol depletion. Pt was admitted then for 12 days in July w/ Na+ 119, UNa 17 and UOsm 124. She was given IVF's w/o much success. Renal team consulted and they started urea/ salt tabs/ fluid restriction and she improved. Po lasix was also add at the end. Na+ was up at dc around 130.  She was dc'd to SNF on lasix 40 every day, urea 30gm am and 15 gm pm. She was not prescribed salt tabs. At the SNF pt was given "3 gatorades a day", not sure when that was started.   Pt was then readmitted here on 8/06 sent from SNF due to hyponatremia w/ Na+ of 124. It improved then dropped again here. Her BUN is quite high here. Pt seen in room. Poor historian. Family quite upset about poor communication w/ SNF and hospital / other MDs, not getting outpatient appts.    ROS - denies CP, no joint pain, no HA, no blurry vision, no rash, no diarrhea, no nausea/ vomiting, no dysuria, no difficulty voiding   Past Medical History  Past Medical History:  Diagnosis Date   Acute kidney injury (HCC) 07/10/2017   Acute respiratory failure with hypoxia (HCC) 07/10/2017   Alcohol abuse     Allergy    Cataract    Diverticulitis 10/30/2011   Elevated troponin 07/10/2017   Hypertension    Lobar pneumonia (HCC) 07/10/2017   Pleural effusion on right 07/10/2017   Sepsis (HCC) 07/10/2017   SVT (supraventricular tachycardia)    Tobacco abuse    UTI (urinary tract infection) 07/10/2017   Vitamin D deficiency 10/30/2011   Past Surgical History  Past Surgical History:  Procedure Laterality Date   ABDOMINAL HYSTERECTOMY     CATARACT EXTRACTION     FRACTURE SURGERY     IR THORACENTESIS ASP PLEURAL SPACE W/IMG GUIDE  07/11/2017   VIDEO ASSISTED THORACOSCOPY (VATS)/EMPYEMA Right 07/12/2017   Procedure: right VIDEO ASSISTED THORACOSCOPY (VATS) for drainage of EMPYEMA and decortication;  Surgeon: Loreli Slot, MD;  Location: [facility] OR;  Service: Thoracic;  Laterality: Right;   VIDEO BRONCHOSCOPY N/A 07/12/2017   Procedure: VIDEO BRONCHOSCOPY;  Surgeon: Loreli Slot, MD;  Location[facility] OR;  Service: Thoracic;  Laterality: N/A;   Family History  Family History  Problem Relation Age of Onset   Hypertension Mother    Heart disease Mother    Hypertension Father    Heart disease Father    Stroke Brother    Hypertension Brother    Heart disease Brother    Social History  reports that  she has been smoking cigarettes. She has a 79.5 pack-year smoking history. She has never used smokeless tobacco. She reports current alcohol use of about 21.0 standard drinks of alcohol per week. She reports that she does not use drugs. Allergies  Allergies  Allergen Reactions   Dextromethorphan Other (See Comments)    Makes her very angry and she wants to hurt people   Home medications Prior to Admission medications   Medication Sig Start Date End Date Taking? Authorizing Provider  albuterol (PROVENTIL HFA;VENTOLIN HFA) 108 (90 Base) MCG/ACT inhaler Inhale 1-2 puffs into the lungs every 4 (four) hours as needed for shortness of breath. 07/10/17  Yes [provider]  aspirin EC 81 MG  tablet Take 1 tablet (81 mg total) by mouth daily. 12/06/17  Yes Wendall Stade, MD  bisacodyl (DULCOLAX) 10 MG suppository Place 10 mg rectally as needed for moderate constipation.   Yes [provider]  cyanocobalamin 1000 MCG tablet Take 1 tablet (1,000 mcg total) by mouth daily. 11/28/22  Yes Willeen Niece, MD  folic acid (FOLVITE) 1 MG tablet Take 1 tablet (1 mg total) by mouth daily. 11/05/22  Yes Cathleen Corti, MD  furosemide (LASIX) 40 MG tablet Take 1 tablet (40 mg total) by mouth daily. 11/29/22  Yes Willeen Niece, MD  hydrALAZINE (APRESOLINE) 25 MG tablet Take 25 mg by mouth 3 (three) times daily.   Yes [provider]  magnesium hydroxide (MILK OF MAGNESIA) 400 MG/5ML suspension Take 30 mLs by mouth daily as needed for mild constipation.   Yes [provider]  magnesium oxide (MAG-OX) 400 (240 Mg) MG tablet Take 1 tablet (400 mg total) by mouth daily. 11/29/22  Yes Willeen Niece, MD  metoprolol succinate (TOPROL XL) 100 MG 24 hr tablet Take 1 tablet (100 mg total) by mouth daily. Take with or immediately following a meal. 11/04/22 11/04/23 Yes Patel, Halina Maidens, MD  Nutritional Supplements (ENSURE ORIGINAL) LIQD Take 237 mLs by mouth in the morning, at noon, and at bedtime.   Yes [provider]  pantoprazole (PROTONIX) 40 MG tablet Take 1 tablet (40 mg total) by mouth daily. 11/29/22  Yes Willeen Niece, MD  rosuvastatin (CRESTOR) 10 MG tablet Take 1 tablet (10 mg total) by mouth daily. 11/05/22  Yes Patel, Poonamkumari J, MD  sodium chloride 1 g tablet Take 1 g by mouth 2 (two) times daily with a meal.   Yes [provider]  Sodium Phosphates (FLEET SALINE ENEMA) 7-19 GM/197ML ENEM Place 1 Bottle rectally daily as needed (constipation).   Yes [provider]  thiamine (VITAMIN B-1) 100 MG tablet Take 1 tablet (100 mg total) by mouth daily. 11/05/22  Yes Patel, Halina Maidens, MD  urea (URE-NA) 15 g PACK oral packet Take 30 g by  mouth daily. 11/30/22  Yes Willeen Niece, MD  urea (URE-NA) 15 g PACK oral packet Take 15 g by mouth daily at 8 pm. 11/29/22 12/29/22 Yes Willeen Niece, MD  potassium chloride SA (KLOR-CON M) 20 MEQ tablet Take 1 tablet (20 mEq total) by mouth daily. Patient not taking: Reported on 12/12/2022 11/30/22   Willeen Niece, MD     Vitals:   12/13/22 2126 12/14/22 0428 12/14/22 0912 12/14/22 1128  BP: (!) 145/66 (!) 140/68 117/65 123/68  Pulse: 62 60 91 64  Resp:  18  16  Temp:  97.8 F (36.6 C)  97.8 F (36.6 C)  TempSrc:  Oral  Oral  SpO2:  93% 96% 98%  Weight:      Height:       Exam Gen alert, no distress No rash, cyanosis or gangrene Sclera anicteric, throat clear  No jvd or bruits Chest clear bilat to bases, no rales/ wheezing RRR no MRG Abd soft ntnd no mass or ascites +bs GU deger MS no joint effusions or deformity Ext no LE or UE edema, no wounds or ulcers Neuro is alert, Ox 3 , nf     Home meds include - bisacodyl, mom, ensure, nacl 1 gm bid, fleets enema, albuterol, aspirin, b12, folic acid, furosemide 40 every day, hydralazine 25 tid, mag-ox, metoprolol xl 100 every day, protonix, crestor, B-1, urea 30 gm am and 15 gm pm, klorcon 20 qd    Admissions   10/23/22 - admit for fall and L tibial plateau fracture, R metatarsal fx. Hx etoh abuse, 4 drinks per day. Hyponatremia w/ Na 125- 128, improved up to 131- 134     Date  UOsm UNa Rx/ course   6/20- 7/01 174 15 Na+ 125 --> 131 at dc w/ IVF"s, due to low vol+ etoh      7/12- 7/24 124 17 Na+ 119 --> 131 on dc rx w/     11/20/22 470 45 Same admit   11/26/22 487  42 Same admit   8/6- 12/12/22 166 21       VSS - BP 140/65, HR 70, RR 18, temp-  afebrile  97% RA     Assessment/ Plan: Hyponatremia - pt seen here in June w/ Na+ 126 probably hypovolemia , w/ component of low solute/ etoh abuse. She was readmitted in July for 12 days w/ Na+ 119. She did not improve w/ 0.9% saline. She responded to urea/ salt tabs/ fluid  restriction. Po lasix was added prior to dc. She was dc'd on urea + fluid restriction and po lasix 40mg / d. At the SNF she was prescribed Gatorade it sounds like (3 per day). She returns now w/ Na+ 124- 130. On exam she looks euvolemic. BUN is very high suggesting vol depletion. She has low UNa and low osm again on her urine lytes. Suspect she also a low solute case primarily due to poor protein intake. SIADH is also a possibility but no cause that I can think of. For now we will (1) stop po lasix 40mg  since will not help long-term for low solute hyponatremia (unless there was edematous condition liek cirrhosis / CHF/ etc) and it could be causing hypovolemia; (2) give 0.9% saline for 20 hrs to see if Na+ improves (BUN^^= dry); (3) we will continue urea 15gm bid for her general low solute intake; (4) ^protein intake; (5) STOP Gatorade and/or other electrolyte solutions from being prescribed at the SNF. There is no data that this helps for low solute or SIADH; (6) consider adding salt tabs as needed.     Vinson Moselle  MD [facility] 12/14/2022, 11:43 AM  Recent Labs  Lab 12/11/22 2219 12/12/22 0529 12/12/22 0936 12/13/22 1733 12/14/22 0522  HGB 11.2* 11.4*  --   --   --   ALBUMIN 3.8 3.5  --   --   --   CALCIUM 9.1 8.9   < > 8.6* 8.8*  PHOS 3.7 4.1  --   --   --   CREATININE 0.58 0.50   < > 0.81 0.74  K 3.8 3.6   < > 4.1 3.9   < > = values in this interval not displayed.   Inpatient medications:  aspirin EC  81 mg Oral Daily   enoxaparin (LOVENOX) injection  40 mg Subcutaneous Daily   folic acid  1 mg Oral Daily   furosemide  40 mg Oral Daily   hydrALAZINE  25 mg Oral TID   magnesium oxide  400 mg Oral Daily   metoprolol succinate  100 mg Oral Daily   multivitamin with minerals  1 tablet Oral Daily   pantoprazole  40 mg Oral Daily   rosuvastatin  10 mg Oral Daily   sodium chloride flush  3 mL Intravenous Q12H   sodium chloride  2 g Oral BID WC   urea  15 g Oral QHS   urea  30 g Oral Daily     sodium chloride     sodium chloride, acetaminophen **OR** acetaminophen, albuterol, HYDROcodone-acetaminophen, ondansetron **OR** ondansetron (ZOFRAN) IV, mouth rinse, sodium chloride flush

## 2022-12-14 NOTE — Plan of Care (Signed)
  Problem: Health Behavior/Discharge Planning: Goal: Ability to manage health-related needs will improve Outcome: Progressing   Problem: Nutrition: Goal: Adequate nutrition will be maintained Outcome: Progressing   Problem: Elimination: Goal: Will not experience complications related to bowel motility Outcome: Progressing   

## 2022-12-15 DIAGNOSIS — E871 Hypo-osmolality and hyponatremia: Secondary | ICD-10-CM | POA: Diagnosis not present

## 2022-12-15 MED ORDER — ENSURE ENLIVE PO LIQD
237.0000 mL | Freq: Two times a day (BID) | ORAL | Status: DC
  Administered 2022-12-15 – 2022-12-16 (×3): 237 mL via ORAL

## 2022-12-15 NOTE — Progress Notes (Signed)
[facility] Progress Note  Subjective: very good response to 0.9% saline, overnight Na+ up to 130 then 134 this am.  Good UOP. Pt seen in room, no c/o's. Looks a bit better.   Vitals:   12/15/22 0426 12/15/22 0929 12/15/22 1351 12/15/22 1404  BP: (!) 153/68 (!) 171/70 (!) 141/71 (!) 143/69  Pulse: 64 64 65 65  Resp: 20     Temp: 98.2 F (36.8 C)   97.7 F (36.5 C)  TempSrc: Oral   Oral  SpO2: 92%   95%  Weight:      Height:        Exam: Gen alert, no distress No rash, cyanosis or gangrene Sclera anicteric, throat clear  No jvd or bruits Chest clear bilat to bases, no rales/ wheezing RRR no MRG Abd soft ntnd no mass or ascites +bs GU deger MS no joint effusions or deformity Ext no LE or UE edema, no wounds or ulcers Neuro is alert, Ox 3 , nf      Home meds include - bisacodyl, mom, ensure, nacl 1 gm bid, fleets enema, albuterol, aspirin, b12, folic acid, furosemide 40 every day, hydralazine 25 tid, mag-ox, metoprolol xl 100 every day, protonix, crestor, B-1, urea 30 gm am and 15 gm pm, klorcon 20 qd     Admissions   10/23/22 - admit for fall and L tibial plateau fracture, R metatarsal fx. Hx etoh abuse, 4 drinks per day. Hyponatremia w/ Na 125- 128, improved up to 131- 134      Date               UOsm  UNa     Rx/ course   6/20- 7/01      174      15        Na+ 125 --> 131 at dc w/ IVF"s, due to low vol+ etoh                                      7/12- 7/24      124      17        Na+ 119 --> 131 on dc    11/20/22          470      45 Same admit prob while taking urea/ protein supps   11/26/22          487      42 Same admit prob while taking urea/ protein supps   8/6- 12/12/22     166      21 Low solute, didn't respond here to urea/ salt tabs       VSS - BP 140/65, HR 70, RR 18, temp-  afebrile  97% RA       Assessment/ Plan: Hyponatremia - pt seen here in June w/ Na+ 126 probably hypovolemia , w/ component of low solute/ etoh abuse. She was readmitted in  July for 12 days w/ Na+ 119. She did not improve w/ 0.9% saline. She responded to urea/ salt tabs/ fluid restriction. Po lasix was added prior to dc. She was dc'd on urea + fluid restriction and po lasix 40mg / d. At the SNF she was prescribed Gatorade it sounds like (3 per day). She returns now w/ Na+ 124- 130. On exam she looks euvolemic. No edematous state (CHF, cirrhosis, CKD).  BUN is very high suggesting vol  depletion. She has low UNa and low osm again on her urine lytes. She may also be low solute. Doubt SIADH.  Pt responded here to holding po lasix 40mg  every day and giving isotonic IVF"s, which means here diagnosis this time was vol depletion, and this was related to po lasix; it helped her on the last admission but now is too strong for her. There may be underlying low solute as another cause,  but there may not be as well. She was a heavy etoh drinker which was cured w/ SNF placement last month. If low solute persists it would be due to low protein and/or low Na diet. At this time would --> -  cont urea 15 gm bid, fluid restriction 1000 cc /d -  salt tabs (similar to urea) could be used also for low solute if needed in the OP setting - f/u labs in the morning - avoid loop diuretics like lasix, too strong for her - avoid pushing electrolyte drinks on her (e.g.Gatorade), they have no use here - will re-schedule a f/u appt w/ Dr Valentino N[facility]  - will follow     Vinson Moselle MD  [facility] 12/15/2022, 3:24 PM  Recent Labs  Lab 12/11/22 2219 12/12/22 0529 12/12/22 0936 12/14/22 1124 12/15/22 0525  HGB 11.2* 11.4*  --   --   --   ALBUMIN 3.8 3.5  --   --   --   CALCIUM 9.1 8.9   < > 9.1 8.9  PHOS 3.7 4.1  --   --   --   CREATININE 0.58 0.50   < > 0.75 0.81  K 3.8 3.6   < > 4.4 4.0   < > = values in this interval not displayed.   No results for input(s): "IRON", "TIBC", "FERRITIN" in the last 168 hours. Inpatient medications:  aspirin EC  81 mg Oral Daily   enoxaparin (LOVENOX) injection   40 mg Subcutaneous Daily   feeding supplement  237 mL Oral BID BM   folic acid  1 mg Oral Daily   hydrALAZINE  25 mg Oral TID   magnesium oxide  400 mg Oral Daily   metoprolol succinate  100 mg Oral Daily   multivitamin with minerals  1 tablet Oral Daily   pantoprazole  40 mg Oral Daily   rosuvastatin  10 mg Oral Daily   sodium chloride flush  3 mL Intravenous Q12H   urea  15 g Oral BID    sodium chloride     sodium chloride, acetaminophen **OR** acetaminophen, albuterol, HYDROcodone-acetaminophen, ondansetron **OR** ondansetron (ZOFRAN) IV, mouth rinse, sodium chloride flush

## 2022-12-15 NOTE — Progress Notes (Signed)
Mobility Specialist - Progress Note   12/15/22 1014  Mobility  Activity Transferred from bed to chair  Level of Assistance Standby assist, set-up cues, supervision of patient - no hands on  Assistive Device Front wheel walker  RLE Weight Bearing WBAT  LLE Weight Bearing NWB  Activity Response Tolerated well  Mobility Referral Yes  $Mobility charge 1 Mobility  Mobility Specialist Start Time (ACUTE ONLY) 0955  Mobility Specialist Stop Time (ACUTE ONLY) 1012  Mobility Specialist Time Calculation (min) (ACUTE ONLY) 17 min   Pt received in bed and agreeable to transfer to recliner. No complaints during transfer. Pt to recliner after session with all needs met. Chair alarm on.   [facility]

## 2022-12-15 NOTE — Plan of Care (Signed)
Cont with plan of care

## 2022-12-15 NOTE — Plan of Care (Signed)
Reinforce education. Monitor labs and vital signs. Review diagnostic test results.

## 2022-12-15 NOTE — Progress Notes (Addendum)
PROGRESS NOTE    Erica Ball  QIO:[id number] DOB: 05-01-1951 DOA: 12/11/2022 PCP: Patient, No Pcp Per   Brief Narrative: 72 year old with past medical history significant for hyponatremia secondary to SIADH, hypokalemia, UTI, history of alcohol abuse, hypertension, history of SVT in the setting of sepsis, hyperlipidemia, left knee fracture recent admission for hyponatremia discharged on sodium tablet, urea and Lasix.  Sent back from skilled nursing facility due to recurrent hyponatremia sodium level down to 19 at the rehab.  Sodium check in the ED was 124. Repeated sodium this morning 130.  Will continue with water restriction resume urea and sodium tablets and Lasix.  She was taking Gatorade at the facility will probably have to hold on that and just do water restriction Case discussed with nephrologist  Sodium down to 126--128. Nephrologist has been consulted.   Assessment & Plan:   Principal Problem:   Hyponatremia Active Problems:   Alcohol abuse   Hypertension   COPD GOLD 0 / still smoking   1-Recurrent Hyponatremia:  -Per skilled nursing facilities repeated sodium at rehab was 119.  Repeated sodium in the ED was 124.  Sodium has increased to 130 with water restriction. -Case discussed with nephrology on-call 8/07, recommend at that time resumption of  sodium tablet, urea and Lasix.  Continue to monitor sodium.  -Nephrology consulted for persistent hyponatremia.  -Appreciate Dr Arlean Hopping assistance.  -Patient was started on NS IV fluids. Sodium has improved to 134/ Lasix and sodium tablet was discontinue, Urea reduce to 15 mg BID>  SIADH, Vs Low solute case due to Low protein intake Vs Hypovolemia.  Cortisol 9  2-Hypertension: Continue with  Toprol, hydralazine Lasix has been discontinue.   COPD: Stable continue with nebulizers Alcohol abuse, history of currently in remission Hypokalemia; Replaced.   Right Foot Bruise, toes, Repeated X ray 8/09 negative for fracture.   Per son she didn't had bruise on that foot.  She did had a CT right foot was negative for fracture in June   Left Closed Tibial Plateau Fracture,  Left Ankle Fracture.  Diagnosed during admission 6/23--7/01. Left Knee immobilizer and NWB on left.  Send Message to Dr Odis Hollingshead, and informed him of patient readmission. He ordered CT tibia and will review and will give [place] recommendations.  CT:  No definite acute fracture. No dislocation. Very limited exam duemto severe osteopenia. Mildly depressed fracture of the medial tibial plateau and nondisplaced fracture of the anterior cortex of the proximal tibia as seen on the prior CT.  Estimated body mass index is 18.03 kg/m as calculated from the following:   Height as of this encounter: 5\' 8"  (1.727 m).   Weight as of this encounter: 53.8 kg.   DVT prophylaxis: Lovenox Code Status: Full code Family Communication: Son Updated. 8/09. Will call son today/  Disposition Plan:  Status is: Inpatient Remains inpatient appropriate because: transfer back to rehab 8/8    Consultants:    Procedures:    Antimicrobials:    Subjective: She is alert, feels well. She answer questions. No new complaints.    Objective: Vitals:   12/14/22 1128 12/14/22 1508 12/14/22 2115 12/15/22 0426  BP: 123/68 (!) 114/56 129/73 (!) 153/68  Pulse: 64  60 64  Resp: 16   20  Temp: 97.8 F (36.6 C)  97.7 F (36.5 C) 98.2 F (36.8 C)  TempSrc: Oral  Oral Oral  SpO2: 98% 97% 90% 92%  Weight:      Height:  Intake/Output Summary (Last 24 hours) at 12/15/2022 0718 Last data filed at 12/15/2022 0600 Gross per 24 hour  Intake 1672.23 ml  Output 2740 ml  Net -1067.77 ml   Filed Weights   12/11/22 2213 12/13/22 1205 12/13/22 1334  Weight: 58.5 kg 57 kg 53.8 kg    Examination:  General exam: NAD Respiratory system: CTA Cardiovascular system: S 1, S 2 RRR Gastrointestinal system: BS present, soft nt Central nervous system; Alert, follows  command Extremities: no edema    Data Reviewed: I have personally reviewed following labs and imaging studies  CBC: Recent Labs  Lab 12/11/22 2219 12/12/22 0529  WBC 6.8 6.3  NEUTROABS 4.6  --   HGB 11.2* 11.4*  HCT 31.6* 33.0*  MCV 94.6 95.4  PLT 306 344   Basic Metabolic Panel: Recent Labs  Lab 12/11/22 2219 12/12/22 0529 12/12/22 0936 12/13/22 1022 12/13/22 1733 12/14/22 0522 12/14/22 1124 12/14/22 1527 12/14/22 2204 12/15/22 0525  NA 124* 130*   < > 128* 126* 126* 128* 127* 130* 134*  K 3.8 3.6   < > 4.8 4.1 3.9 4.4  --   --  4.0  CL 87* 93*   < > 91* 91* 88* 91*  --   --  95*  CO2 27 30   < > 28 27 28 28   --   --  29  GLUCOSE 96 85   < > 105* 167* 100* 120*  --   --  101*  BUN 39* 25*   < > 18 58* 50* 76*  --   --  55*  CREATININE 0.58 0.50   < > 0.76 0.81 0.74 0.75  --   --  0.81  CALCIUM 9.1 8.9   < > 8.9 8.6* 8.8* 9.1  --   --  8.9  MG 2.0 1.9  --   --   --   --   --   --   --   --   PHOS 3.7 4.1  --   --   --   --   --   --   --   --    < > = values in this interval not displayed.   GFR: Estimated Creatinine Clearance: 52.5 mL/min (by C-G formula based on SCr of 0.81 mg/dL). Liver Function Tests: Recent Labs  Lab 12/11/22 2219 12/12/22 0529  AST 17 17  ALT 16 14  ALKPHOS 56 46  BILITOT 0.8 0.7  PROT 7.0 6.5  ALBUMIN 3.8 3.5   No results for input(s): "LIPASE", "AMYLASE" in the last 168 hours. Recent Labs  Lab 12/12/22 0018  AMMONIA 11   Coagulation Profile: Recent Labs  Lab 12/12/22 0018  INR 1.0   Cardiac Enzymes: Recent Labs  Lab 12/11/22 2219  CKTOTAL 62   BNP (last 3 results) No results for input(s): "PROBNP" in the last 8760 hours. HbA1C: No results for input(s): "HGBA1C" in the last 72 hours. CBG: No results for input(s): "GLUCAP" in the last 168 hours. Lipid Profile: No results for input(s): "CHOL", "HDL", "LDLCALC", "TRIG", "CHOLHDL", "LDLDIRECT" in the last 72 hours. Thyroid Function Tests: No results for input(s):  "TSH", "T4TOTAL", "FREET4", "T3FREE", "THYROIDAB" in the last 72 hours.  Anemia Panel: Recent Labs    12/12/22 0936  VITAMINB12 599   Sepsis Labs: No results for input(s): "PROCALCITON", "LATICACIDVEN" in the last 168 hours.  No results found for this or any previous visit (from the past 240 hour(s)).  Radiology Studies: CT TIBIA FIBULA LEFT WO CONTRAST  Result Date: 12/14/2022 CLINICAL DATA:  Fracture. EXAM: CT OF THE LOWER LEFT EXTREMITY WITHOUT CONTRAST TECHNIQUE: Multidetector CT imaging of the lower left extremity was performed according to the standard protocol. RADIATION DOSE REDUCTION: This exam was performed according to the departmental dose-optimization program which includes automated exposure control, adjustment of the mA and/or kV according to patient size and/or use of iterative reconstruction technique. COMPARISON:  CT of the left ankle dated 10/28/2022. left knee CT dated 10/23/2022. FINDINGS: Bones/Joint/Cartilage Mildly depressed fracture of the medial tibial plateau and nondisplaced fracture of the anterior cortex of the proximal tibia as seen on the prior CT. No other acute fracture. Evaluation for fracture however is very limited due to advanced osteopenia. The previously seen fracture of the lateral malleolus is poorly visualized on today's exam. There is no dislocation. Ligaments Suboptimally assessed by CT. Muscles and Tendons No acute findings. Soft tissues No acute findings. IMPRESSION: 1. No definite acute fracture. No dislocation. Very limited exam due to severe osteopenia. 2. Mildly depressed fracture of the medial tibial plateau and nondisplaced fracture of the anterior cortex of the proximal tibia as seen on the prior CT. Electronically Signed   By: Elgie Collard M.D.   On: 12/14/2022 20:44   DG Foot Complete Right  Result Date: 12/14/2022 CLINICAL DATA:  Bruce EXAM: RIGHT FOOT COMPLETE - 3+ VIEW COMPARISON:  Right foot x-ray 10/23/2022 FINDINGS: The  bones are diffusely osteopenic. There is no acute fracture or dislocation identified. There are healed third and fourth metacarpal neck fractures. There is mild hallux valgus. Orthopedic hardware is seen in the lateral malleolus and medial malleolus. Soft tissues are within normal limits. IMPRESSION: 1. No acute fracture or dislocation identified. 2. Diffuse osteopenia. 3. Mild hallux valgus. Electronically Signed   By: Darliss Cheney M.D.   On: 12/14/2022 18:25        Scheduled Meds:  aspirin EC  81 mg Oral Daily   enoxaparin (LOVENOX) injection  40 mg Subcutaneous Daily   folic acid  1 mg Oral Daily   hydrALAZINE  25 mg Oral TID   magnesium oxide  400 mg Oral Daily   metoprolol succinate  100 mg Oral Daily   multivitamin with minerals  1 tablet Oral Daily   pantoprazole  40 mg Oral Daily   rosuvastatin  10 mg Oral Daily   sodium chloride flush  3 mL Intravenous Q12H   urea  15 g Oral BID   Continuous Infusions:  sodium chloride     sodium chloride 100 mL/hr at 12/15/22 0400     LOS: 3 days    Time spent: 35 minutes    Kamesha Herne A Abby Tucholski, MD [facility]   If 7PM-7AM, please contact night-coverage [email or web address]  12/15/2022, 7:18 AM

## 2022-12-16 DIAGNOSIS — E871 Hypo-osmolality and hyponatremia: Secondary | ICD-10-CM | POA: Diagnosis not present

## 2022-12-16 LAB — BASIC METABOLIC PANEL WITH GFR
Anion gap: 9 (ref 5–15)
BUN: 41 mg/dL — ABNORMAL HIGH (ref 8–23)
CO2: 29 mmol/L (ref 22–32)
Calcium: 9.3 mg/dL (ref 8.9–10.3)
Chloride: 96 mmol/L — ABNORMAL LOW (ref 98–111)
Creatinine, Ser: 0.65 mg/dL (ref 0.44–1.00)
GFR, Estimated: >60 mL/min (ref >60)
Glucose, Bld: 137 mg/dL — ABNORMAL HIGH (ref 70–99)
Potassium: 3.9 mmol/L (ref 3.5–5.1)
Sodium: 134 mmol/L — ABNORMAL LOW (ref 135–145)

## 2022-12-16 NOTE — Progress Notes (Addendum)
[facility] Progress Note  Subjective: Na+ stable at 134 today.   Vitals:   12/16/22 0519 12/16/22 0540 12/16/22 0947 12/16/22 1119  BP: 139/76  (!) 140/72 (!) 163/71  Pulse: (!) 58   (!) 58  Resp: 15     Temp: (!) 97.5 F (36.4 C)   (!) 97.5 F (36.4 C)  TempSrc: Oral   Oral  SpO2: 94%   100%  Weight:  57.3 kg    Height:        Exam: Gen alert, no distress No rash, cyanosis or gangrene Sclera anicteric, throat clear  No jvd or bruits Chest clear bilat to bases, no rales/ wheezing RRR no MRG Abd soft ntnd no mass or ascites +bs GU deger MS no joint effusions or deformity Ext no LE or UE edema, no wounds or ulcers Neuro is alert, Ox 3 , nf      Home meds include - bisacodyl, mom, ensure, nacl 1 gm bid, fleets enema, albuterol, aspirin, b12, folic acid, furosemide 40 every day, hydralazine 25 tid, mag-ox, metoprolol xl 100 every day, protonix, crestor, B-1, urea 30 gm am and 15 gm pm, klorcon 20 qd     Admissions   10/23/22 - admit for fall and L tibial plateau fracture, R metatarsal fx. Hx etoh abuse, 4 drinks per day. Hyponatremia w/ Na 125- 128, improved up to 131- 134      Date               UOsm  UNa     Rx/ course   6/20- 7/01      174      15        Na+ 125 --> 131 at dc w/ IVF"s, due to low vol+ etoh                                      7/12- 7/24      124      17        Na+ 119 --> 131 on dc    11/20/22          470      45 Same admit prob while taking urea/ protein supps   11/26/22          487      42 Same admit prob while taking urea/ protein supps   8/6- 12/12/22     166      21 Low solute, didn't respond here to urea/ salt tabs       VSS - BP 140/65, HR 70, RR 18, temp-  afebrile  97% RA       Assessment/ Plan: Hyponatremia - pt seen here in June w/ Na+ 126 probably hypovolemia , w/ component of low solute/ etoh abuse. She was readmitted in July for 12 days w/ Na+ 119. She did not improve w/ 0.9% saline. She responded to urea/ salt tabs/ fluid  restriction. Po lasix was added prior to dc. She was dc'd on urea + fluid restriction and po lasix 40mg / d. At the SNF she was prescribed Gatorade it sounds like (3 per day). She returns now w/ Na+ 124- 130. On exam she looks euvolemic. No edematous state (CHF, cirrhosis, CKD).  BUN is very high suggesting vol depletion. She has low UNa and low osm again on her urine lytes. She may also be low solute. Doubt  SIADH.  Pt responded here to holding po lasix 40mg  and giving isotonic IVF"s, which means this diagnosis this time was vol depletion due to overdiuresis. There may or many not still be an underlying low solute as another cause. She was a heavy etoh drinker which stopped w/ SNF placement last month. If low solute persists it would be diet related. At this time would upon discharge--> -  cont urea 15 gm bid and fluid restriction 1200 cc /d - avoid loop diuretics like lasix, too strong for her - avoid electrolyte drinks (e.g.Gatorade), they have no use here - we will re-schedule a f/u appt w/ Dr Valentino Nose at [facility] - will sign off   Vinson Moselle MD  [facility] 12/16/2022, 1:53 PM  Recent Labs  Lab 12/11/22 2219 12/12/22 0529 12/12/22 0936 12/15/22 0525 12/16/22 0451  HGB 11.2* 11.4*  --   --   --   ALBUMIN 3.8 3.5  --   --   --   CALCIUM 9.1 8.9   < > 8.9 9.3  PHOS 3.7 4.1  --   --   --   CREATININE 0.58 0.50   < > 0.81 0.65  K 3.8 3.6   < > 4.0 3.9   < > = values in this interval not displayed.   No results for input(s): "IRON", "TIBC", "FERRITIN" in the last 168 hours. Inpatient medications:  aspirin EC  81 mg Oral Daily   enoxaparin (LOVENOX) injection  40 mg Subcutaneous Daily   feeding supplement  237 mL Oral BID BM   folic acid  1 mg Oral Daily   hydrALAZINE  25 mg Oral TID   magnesium oxide  400 mg Oral Daily   metoprolol succinate  100 mg Oral Daily   multivitamin with minerals  1 tablet Oral Daily   pantoprazole  40 mg Oral Daily   rosuvastatin  10 mg Oral Daily   sodium chloride  flush  3 mL Intravenous Q12H   urea  15 g Oral BID    sodium chloride     sodium chloride, acetaminophen **OR** acetaminophen, albuterol, HYDROcodone-acetaminophen, ondansetron **OR** ondansetron (ZOFRAN) IV, mouth rinse, sodium chloride flush

## 2022-12-16 NOTE — Progress Notes (Signed)
Physical Therapy Treatment Patient Details Name: SHIZA PROVENCAL MRN: [id number] DOB: [birth year] Today's Date: 12/16/2022   History of Present Illness 72 year old with past medical history significant for hyponatremia secondary to SIADH, hypokalemia, UTI, history of alcohol abuse, hypertension, history of SVT in the setting of sepsis, hyperlipidemia, left knee fracture recent admission for hyponatremia discharged on sodium tablet, urea and Lasix.  Sent back from skilled nursing facility due to recurrent hyponatremia sodium level down to 19 at the rehab.  Sodium check in the ED was 124.    PT Comments  Pt agreeable to working with therapy. She was able to perform and piovt transfer over to recliner using RW. Unable to take any hopping steps on today. Good adherence to NWB status during transfer. Patient will benefit from continued inpatient follow up therapy, <3 hours/day     If plan is discharge home, recommend the following: Assist for transportation;Help with stairs or ramp for entrance;A lot of help with walking and/or transfers;Assistance with cooking/housework   Can travel by private vehicle     No  Equipment Recommendations  None recommended by PT    Recommendations for Other Services       Precautions / Restrictions Precautions Precautions: Fall Required Braces or Orthoses: Knee Immobilizer - Left Knee Immobilizer - Left: On at all times Restrictions Weight Bearing Restrictions: Yes LLE Weight Bearing: Non weight bearing     Mobility  Bed Mobility Overal bed mobility: Modified Independent Bed Mobility: Supine to Sit     Supine to sit: Modified independent (Device/Increase time)          Transfers Overall transfer level: Needs assistance Equipment used: Rolling walker (2 wheels) Transfers: Sit to/from Stand, Bed to chair/wheelchair/BSC Sit to Stand: Min assist, From elevated surface Stand pivot transfers: Min assist         General transfer comment: Cues  for safety, hand placement, adherence to NWB status. Assist to power up and steady. Pt able to twist around/pivot on R foot to get over to recliner. Increased time. Able to maintain NWB throughout.    Ambulation/Gait                   Stairs             Wheelchair Mobility     Tilt Bed    Modified Rankin (Stroke Patients Only)       Balance Overall balance assessment: Needs assistance, History of Falls         Standing balance support: During functional activity, Bilateral upper extremity supported, Reliant on assistive device for balance Standing balance-Leahy Scale: Poor                              Cognition Arousal: Alert Behavior During Therapy: WFL for tasks assessed/performed Overall Cognitive Status: Within Functional Limits for tasks assessed                                          Exercises      General Comments        Pertinent Vitals/Pain Pain Assessment Pain Assessment: No/denies pain    Home Living                          Prior Function  PT Goals (current goals can now be found in the care plan section) Progress towards PT goals: Progressing toward goals    Frequency           PT Plan Current plan remains appropriate    Co-evaluation              AM-PAC PT "6 Clicks" Mobility   Outcome Measure  Help needed turning from your back to your side while in a flat bed without using bedrails?: None Help needed moving from lying on your back to sitting on the side of a flat bed without using bedrails?: A Little Help needed moving to and from a bed to a chair (including a wheelchair)?: A Little Help needed standing up from a chair using your arms (e.g., wheelchair or bedside chair)?: A Little Help needed to walk in hospital room?: Total Help needed climbing 3-5 steps with a railing? : Total 6 Click Score: 15    End of Session Equipment Utilized During Treatment:  Gait belt;Left knee immobilizer Activity Tolerance: Patient tolerated treatment well Patient left: in chair;with call bell/phone within reach;with chair alarm set   PT Visit Diagnosis: Unsteadiness on feet (R26.81);Muscle weakness (generalized) (M62.81);History of falling (Z91.81);Difficulty in walking, not elsewhere classified (R26.2)     Time: 1145-1200 PT Time Calculation (min) (ACUTE ONLY): 15 min  Charges:    $Therapeutic Activity: 8-22 mins PT General Charges $$ ACUTE PT VISIT: 1 Visit                         Faye Ramsay, PT Acute Rehabilitation  Office: [phone]

## 2022-12-16 NOTE — Progress Notes (Signed)
PROGRESS NOTE    Erica Ball  VHQ:[id number] DOB: 1950-07-21 DOA: 12/11/2022 PCP: Patient, No Pcp Per   Brief Narrative: 72 year old with past medical history significant for hyponatremia secondary to SIADH, hypokalemia, UTI, history of alcohol abuse, hypertension, history of SVT in the setting of sepsis, hyperlipidemia, left knee fracture recent admission for hyponatremia discharged on sodium tablet, urea and Lasix.  Sent back from skilled nursing facility due to recurrent hyponatremia sodium level down to 19 at the rehab.  Sodium check in the ED was 124. Repeated sodium this morning 130.  Will continue with water restriction resume urea and sodium tablets and Lasix.  She was taking Gatorade at the facility will probably have to hold on that and just do water restriction Case discussed with nephrologist  Sodium down to 126--128. Nephrologist has been consulted.   Assessment & Plan:   Principal Problem:   Hyponatremia Active Problems:   Alcohol abuse   Hypertension   COPD GOLD 0 / still smoking   1-Recurrent Hyponatremia:  -Per skilled nursing facilities repeated sodium at rehab was 119.  Repeated sodium in the ED was 124.  Sodium has increased to 130 with water restriction. -Case discussed with nephrology on-call 8/07, recommend at that time resumption of  sodium tablet, urea and Lasix.  Continue to monitor sodium.  -Nephrology consulted for persistent hyponatremia.  -Appreciate Dr Arlean Hopping assistance.  -Patient was started on NS IV fluids. Sodium has improved to 134/ Lasix and sodium tablet was discontinue, Urea reduce to 15 mg BID>  Nephrologist impression for hyponatremia this admission was related to hypovolemia/dehydration in setting of lasix. He think patient has Low Solute state, rather than SIADH.  Cortisol 9 Plan to monitor sodium level for stability today. Repeat labs tomorrow.   2-Hypertension: Continue with  Toprol, hydralazine Lasix has been discontinue.  Post  medication SBP 140---130 yesterday.   COPD: Stable continue with nebulizers Alcohol abuse, history of currently in remission Hypokalemia; Replaced.   Right Foot Bruise, toes, Repeated X ray 8/09 negative for fracture.  Per son she didn't had bruise on that foot.  She did had a CT right foot was negative for fracture in June   Left Closed Tibial Plateau Fracture,  Left Ankle Fracture.  Diagnosed during admission 6/23--7/01. Left Knee immobilizer and NWB on left.  Send Message to Dr Odis Hollingshead, and informed him of patient readmission. He ordered CT tibia and will review and will give [place] recommendations.  CT:  No definite acute fracture. No dislocation. Very limited exam duemto severe osteopenia. Mildly depressed fracture of the medial tibial plateau and nondisplaced fracture of the anterior cortex of the proximal tibia as seen on the prior CT.  Estimated body mass index is 19.21 kg/m as calculated from the following:   Height as of this encounter: 5\' 8"  (1.727 m).   Weight as of this encounter: 57.3 kg.   DVT prophylaxis: Lovenox Code Status: Full code Family Communication: Son Updated 8/11 Disposition Plan:  Status is: Inpatient Remains inpatient appropriate because: transfer back to rehab 8/12 if sodium remain stable.     Consultants:    Procedures:    Antimicrobials:    Subjective: She is feeling well, clear, not confuse.   Objective: Vitals:   12/16/22 0519 12/16/22 0540 12/16/22 0947 12/16/22 1119  BP: 139/76  (!) 140/72 (!) 163/71  Pulse: (!) 58   (!) 58  Resp: 15     Temp: (!) 97.5 F (36.4 C)   (!) 97.5 F (36.4 C)  TempSrc: Oral   Oral  SpO2: 94%   100%  Weight:  57.3 kg    Height:        Intake/Output Summary (Last 24 hours) at 12/16/2022 1203 Last data filed at 12/16/2022 0958 Gross per 24 hour  Intake 540 ml  Output 200 ml  Net 340 ml   Filed Weights   12/13/22 1205 12/13/22 1334 12/16/22 0540  Weight: 57 kg 53.8 kg 57.3 kg     Examination:  General exam: NAD Respiratory system: CTA Cardiovascular system: S 1, S 2 RRR Gastrointestinal system: BS present, soft. Central nervous system; Alert, follows command Extremities: no edema    Data Reviewed: I have personally reviewed following labs and imaging studies  CBC: Recent Labs  Lab 12/11/22 2219 12/12/22 0529  WBC 6.8 6.3  NEUTROABS 4.6  --   HGB 11.2* 11.4*  HCT 31.6* 33.0*  MCV 94.6 95.4  PLT 306 344   Basic Metabolic Panel: Recent Labs  Lab 12/11/22 2219 12/12/22 0529 12/12/22 0936 12/13/22 1733 12/14/22 0522 12/14/22 1124 12/14/22 1527 12/14/22 2204 12/15/22 0525 12/16/22 0451  NA 124* 130*   < > 126* 126* 128* 127* 130* 134* 134*  K 3.8 3.6   < > 4.1 3.9 4.4  --   --  4.0 3.9  CL 87* 93*   < > 91* 88* 91*  --   --  95* 96*  CO2 27 30   < > 27 28 28   --   --  29 29  GLUCOSE 96 85   < > 167* 100* 120*  --   --  101* 137*  BUN 39* 25*   < > 58* 50* 76*  --   --  55* 41*  CREATININE 0.58 0.50   < > 0.81 0.74 0.75  --   --  0.81 0.65  CALCIUM 9.1 8.9   < > 8.6* 8.8* 9.1  --   --  8.9 9.3  MG 2.0 1.9  --   --   --   --   --   --   --   --   PHOS 3.7 4.1  --   --   --   --   --   --   --   --    < > = values in this interval not displayed.   GFR: Estimated Creatinine Clearance: 56.7 mL/min (by C-G formula based on SCr of 0.65 mg/dL). Liver Function Tests: Recent Labs  Lab 12/11/22 2219 12/12/22 0529  AST 17 17  ALT 16 14  ALKPHOS 56 46  BILITOT 0.8 0.7  PROT 7.0 6.5  ALBUMIN 3.8 3.5   No results for input(s): "LIPASE", "AMYLASE" in the last 168 hours. Recent Labs  Lab 12/12/22 0018  AMMONIA 11   Coagulation Profile: Recent Labs  Lab 12/12/22 0018  INR 1.0   Cardiac Enzymes: Recent Labs  Lab 12/11/22 2219  CKTOTAL 62   BNP (last 3 results) No results for input(s): "PROBNP" in the last 8760 hours. HbA1C: No results for input(s): "HGBA1C" in the last 72 hours. CBG: No results for input(s): "GLUCAP" in the  last 168 hours. Lipid Profile: No results for input(s): "CHOL", "HDL", "LDLCALC", "TRIG", "CHOLHDL", "LDLDIRECT" in the last 72 hours. Thyroid Function Tests: No results for input(s): "TSH", "T4TOTAL", "FREET4", "T3FREE", "THYROIDAB" in the last 72 hours.  Anemia Panel: No results for input(s): "VITAMINB12", "FOLATE", "FERRITIN", "TIBC", "IRON", "RETICCTPCT" in the last 72 hours.  Sepsis Labs: No results  for input(s): "PROCALCITON", "LATICACIDVEN" in the last 168 hours.  No results found for this or any previous visit (from the past 240 hour(s)).       Radiology Studies: CT TIBIA FIBULA LEFT WO CONTRAST  Result Date: 12/14/2022 CLINICAL DATA:  Fracture. EXAM: CT OF THE LOWER LEFT EXTREMITY WITHOUT CONTRAST TECHNIQUE: Multidetector CT imaging of the lower left extremity was performed according to the standard protocol. RADIATION DOSE REDUCTION: This exam was performed according to the departmental dose-optimization program which includes automated exposure control, adjustment of the mA and/or kV according to patient size and/or use of iterative reconstruction technique. COMPARISON:  CT of the left ankle dated 10/28/2022. left knee CT dated 10/23/2022. FINDINGS: Bones/Joint/Cartilage Mildly depressed fracture of the medial tibial plateau and nondisplaced fracture of the anterior cortex of the proximal tibia as seen on the prior CT. No other acute fracture. Evaluation for fracture however is very limited due to advanced osteopenia. The previously seen fracture of the lateral malleolus is poorly visualized on today's exam. There is no dislocation. Ligaments Suboptimally assessed by CT. Muscles and Tendons No acute findings. Soft tissues No acute findings. IMPRESSION: 1. No definite acute fracture. No dislocation. Very limited exam due to severe osteopenia. 2. Mildly depressed fracture of the medial tibial plateau and nondisplaced fracture of the anterior cortex of the proximal tibia as seen on the  prior CT. Electronically Signed   By: Elgie Collard M.D.   On: 12/14/2022 20:44   DG Foot Complete Right  Result Date: 12/14/2022 CLINICAL DATA:  Bruce EXAM: RIGHT FOOT COMPLETE - 3+ VIEW COMPARISON:  Right foot x-ray 10/23/2022 FINDINGS: The bones are diffusely osteopenic. There is no acute fracture or dislocation identified. There are healed third and fourth metacarpal neck fractures. There is mild hallux valgus. Orthopedic hardware is seen in the lateral malleolus and medial malleolus. Soft tissues are within normal limits. IMPRESSION: 1. No acute fracture or dislocation identified. 2. Diffuse osteopenia. 3. Mild hallux valgus. Electronically Signed   By: Darliss Cheney M.D.   On: 12/14/2022 18:25        Scheduled Meds:  aspirin EC  81 mg Oral Daily   enoxaparin (LOVENOX) injection  40 mg Subcutaneous Daily   feeding supplement  237 mL Oral BID BM   folic acid  1 mg Oral Daily   hydrALAZINE  25 mg Oral TID   magnesium oxide  400 mg Oral Daily   metoprolol succinate  100 mg Oral Daily   multivitamin with minerals  1 tablet Oral Daily   pantoprazole  40 mg Oral Daily   rosuvastatin  10 mg Oral Daily   sodium chloride flush  3 mL Intravenous Q12H   urea  15 g Oral BID   Continuous Infusions:  sodium chloride       LOS: 4 days    Time spent: 35 minutes    Dontrae Morini A Blandon Offerdahl, MD [facility]   If 7PM-7AM, please contact night-coverage [email or web address]  12/16/2022, 12:03 PM

## 2022-12-17 ENCOUNTER — Ambulatory Visit: Payer: Medicare HMO | Admitting: Physician Assistant

## 2022-12-17 DIAGNOSIS — E871 Hypo-osmolality and hyponatremia: Secondary | ICD-10-CM | POA: Diagnosis not present

## 2022-12-17 MED ORDER — ENSURE ENLIVE PO LIQD: 237.0000 mL | Freq: Two times a day (BID) | ORAL | 12 refills | Status: DC

## 2022-12-17 MED ORDER — ONDANSETRON HCL 4 MG PO TABS: 4.0000 mg | ORAL_TABLET | Freq: Four times a day (QID) | ORAL | 0 refills | Status: AC | PRN

## 2022-12-17 MED ORDER — UREA 15 G PO PACK: 15.0000 g | PACK | Freq: Two times a day (BID) | ORAL | 0 refills | Status: AC

## 2022-12-17 NOTE — Discharge Summary (Signed)
Physician Discharge Summary   Patient: Erica Ball MRN: [id number] DOB: 03/04/1951  Admit date:     12/11/2022  Discharge date: 12/17/22  Discharge Physician: Alba Cory   PCP: Patient, No Pcp Per   Recommendations at discharge:    Patient needs to follow 1.2 L fluid restriction.  Avoid Gatorade.  Patient will need to continue Urea 15 g BID>  Needs B-met to follow sodium level in 48 hours, send  results to nephrologist Dr Brooke Bonito Office.  Keep Appointment with Orthopedic on Thursday.  Recommendation for weightbearing have been change to Touch down weight bearing to left LE with Walker.  Of note, lasix has been discontinue.  Ok for patient to follow regular diet, ok to add salt to her meals.    Awaiting insurance approval to discharge patient to rehab.   Discharge Diagnoses: Principal Problem:   Hyponatremia Active Problems:   Alcohol abuse   Hypertension   COPD GOLD 0 / still smoking   Resolved Problems:   * No resolved hospital problems. *  Hospital Course: 72 year old with past medical history significant for hyponatremia secondary to SIADH, hypokalemia, UTI, history of alcohol abuse, hypertension, history of SVT in the setting of sepsis, hyperlipidemia, left knee fracture recent admission for hyponatremia discharged on sodium tablet, urea and Lasix.  Sent back from skilled nursing facility due to recurrent hyponatremia sodium level down to 19 at the rehab.  Sodium check in the ED was 124. Repeated sodium this morning 130.  Will continue with water restriction resume urea and sodium tablets and Lasix.  She was taking Gatorade at the facility will probably have to hold on that and just do water restriction Case discussed with nephrologist   Sodium down to 126--128. Nephrologist has been consulted. Patient hyponatremia this admission improved with IV fluids. Lasix has been discontinue. Urea dose was change to 15 gr BID> continue with water restriction 1.2 L.      Assessment and Plan: 1-Recurrent Hyponatremia:  -Per skilled nursing facilities repeated sodium at rehab was 119.  Repeated sodium in the ED was 124.  Sodium has increased to 130 with water restriction. -Case discussed with nephrology on-call 8/07, recommend at that time resumption of  sodium tablet, urea and Lasix.  Continue to monitor sodium.  -Nephrology consulted for persistent hyponatremia.  -Appreciate Dr Arlean Hopping assistance.  -Patient was started on NS IV fluids. Sodium has improved to 134/ Lasix and sodium tablet was discontinue, Urea reduce to 15 g BID>  -Nephrologist impression for hyponatremia this admission was related to hypovolemia/dehydration in setting of lasix. He think patient has Low Solute state, rather than SIADH. Encourage protein intake.  Cortisol 9 Sodium stable 133. Discussed with nephrologist, no further changes.  Could add Sodium Tablet out patient as needed.   2-Hypertension: Continue with  Toprol, hydralazine Lasix has been discontinue.  Post medication SBP 140---130   COPD: Stable continue with nebulizers Alcohol abuse, history of currently in remission Hypokalemia; Replaced.    Right Foot Bruise, toes, Repeated X ray 8/09 negative for fracture.  Per son she didn't had bruise on that foot.  She did had a CT right foot was negative for fracture in June     Left Closed Tibial Plateau Fracture,  Left Ankle Fracture.  Diagnosed during admission 6/23--7/01. Left Knee immobilizer and NWB on left.  Send Message to Dr Odis Hollingshead, and informed him of patient readmission. He ordered CT tibia and will review and will give [place] recommendations.  CT:  No definite  acute fracture. No dislocation. Very limited exam duemto severe osteopenia. Mildly depressed fracture of the medial tibial plateau and nondisplaced fracture of the anterior cortex of the proximal tibia as seen on the prior CT. Dr Rhona Leavens reviewed CT, patient is healing well. He change weightbearing to TDWB  with walker.   Estimated body mass index is 19.21 kg/m as calculated from the following:   Height as of this encounter: 5\' 8"  (1.727 m).   Weight as of this encounter: 57.3 kg.            Consultants: Ortho, nephrology  Procedures performed: None Disposition: Skilled nursing facility Diet recommendation:  Regular diet DISCHARGE MEDICATION: Allergies as of 12/17/2022       Reactions   Dextromethorphan Other (See Comments)   Makes her very angry and she wants to hurt people        Medication List     STOP taking these medications    Fleet Saline Enema 7-19 GM/197ML Enem   furosemide 40 MG tablet Commonly known as: LASIX   magnesium hydroxide 400 MG/5ML suspension Commonly known as: MILK OF MAGNESIA   potassium chloride SA 20 MEQ tablet Commonly known as: KLOR-CON M   sodium chloride 1 g tablet       TAKE these medications    albuterol 108 (90 Base) MCG/ACT inhaler Commonly known as: VENTOLIN HFA Inhale 1-2 puffs into the lungs every 4 (four) hours as needed for shortness of breath.   aspirin EC 81 MG tablet Take 1 tablet (81 mg total) by mouth daily.   bisacodyl 10 MG suppository Commonly known as: DULCOLAX Place 10 mg rectally as needed for moderate constipation.   cyanocobalamin 1000 MCG tablet Take 1 tablet (1,000 mcg total) by mouth daily.   feeding supplement Liqd Take 237 mLs by mouth 2 (two) times daily between meals. What changed: when to take this   folic acid 1 MG tablet Commonly known as: FOLVITE Take 1 tablet (1 mg total) by mouth daily.   hydrALAZINE 25 MG tablet Commonly known as: APRESOLINE Take 25 mg by mouth 3 (three) times daily.   magnesium oxide 400 (240 Mg) MG tablet Commonly known as: MAG-OX Take 1 tablet (400 mg total) by mouth daily.   metoprolol succinate 100 MG 24 hr tablet Commonly known as: Toprol XL Take 1 tablet (100 mg total) by mouth daily. Take with or immediately following a meal.   ondansetron 4 MG  tablet Commonly known as: ZOFRAN Take 1 tablet (4 mg total) by mouth every 6 (six) hours as needed for nausea.   pantoprazole 40 MG tablet Commonly known as: PROTONIX Take 1 tablet (40 mg total) by mouth daily.   rosuvastatin 10 MG tablet Commonly known as: CRESTOR Take 1 tablet (10 mg total) by mouth daily.   thiamine 100 MG tablet Commonly known as: Vitamin B-1 Take 1 tablet (100 mg total) by mouth daily.   urea 15 g Pack oral packet Commonly known as: URE-NA Take 15 g by mouth 2 (two) times daily. What changed:  when to take this Another medication with the same name was removed. Continue taking this medication, and follow the directions you see here.        Follow-up Information     Netta Cedars, MD Follow up in 1 week(s).   Specialty: Orthopedic Surgery Contact information: [address], [address] [place] [address] [phone]                Discharge Exam:  Filed Weights   12/13/22 1334 12/16/22 0540 12/17/22 0610  Weight: 53.8 kg 57.3 kg 57 kg   General; NAD  Condition at discharge: stable  The results of significant diagnostics from this hospitalization (including imaging, microbiology, ancillary and laboratory) are listed below for reference.   Imaging Studies: CT TIBIA FIBULA LEFT WO CONTRAST  Result Date: 12/14/2022 CLINICAL DATA:  Fracture. EXAM: CT OF THE LOWER LEFT EXTREMITY WITHOUT CONTRAST TECHNIQUE: Multidetector CT imaging of the lower left extremity was performed according to the standard protocol. RADIATION DOSE REDUCTION: This exam was performed according to the departmental dose-optimization program which includes automated exposure control, adjustment of the mA and/or kV according to patient size and/or use of iterative reconstruction technique. COMPARISON:  CT of the left ankle dated 10/28/2022. left knee CT dated 10/23/2022. FINDINGS: Bones/Joint/Cartilage Mildly depressed fracture of the medial tibial plateau and  nondisplaced fracture of the anterior cortex of the proximal tibia as seen on the prior CT. No other acute fracture. Evaluation for fracture however is very limited due to advanced osteopenia. The previously seen fracture of the lateral malleolus is poorly visualized on today's exam. There is no dislocation. Ligaments Suboptimally assessed by CT. Muscles and Tendons No acute findings. Soft tissues No acute findings. IMPRESSION: 1. No definite acute fracture. No dislocation. Very limited exam due to severe osteopenia. 2. Mildly depressed fracture of the medial tibial plateau and nondisplaced fracture of the anterior cortex of the proximal tibia as seen on the prior CT. Electronically Signed   By: Elgie Collard M.D.   On: 12/14/2022 20:44   DG Foot Complete Right  Result Date: 12/14/2022 CLINICAL DATA:  Bruce EXAM: RIGHT FOOT COMPLETE - 3+ VIEW COMPARISON:  Right foot x-ray 10/23/2022 FINDINGS: The bones are diffusely osteopenic. There is no acute fracture or dislocation identified. There are healed third and fourth metacarpal neck fractures. There is mild hallux valgus. Orthopedic hardware is seen in the lateral malleolus and medial malleolus. Soft tissues are within normal limits. IMPRESSION: 1. No acute fracture or dislocation identified. 2. Diffuse osteopenia. 3. Mild hallux valgus. Electronically Signed   By: Darliss Cheney M.D.   On: 12/14/2022 18:25   DG CHEST PORT 1 VIEW  Result Date: 12/12/2022 CLINICAL DATA:  Hyponatremia. EXAM: PORTABLE CHEST 1 VIEW COMPARISON:  November 16, 2022 FINDINGS: The heart size and mediastinal contours are within normal limits. There is marked severity calcification of the thoracic aorta. Both lungs are clear. A chronic appearing deformity is seen involving the distal right clavicle. Multilevel degenerative changes are seen throughout the thoracic spine. IMPRESSION: No active cardiopulmonary disease. Electronically Signed   By: Aram Candela M.D.   On: 12/12/2022 00:20    DG Knee 1-2 Views Left  Result Date: 11/26/2022 CLINICAL DATA:  Left knee pain for 1 month. Known left tibial plateau fracture. EXAM: LEFT KNEE - 1-2 VIEW COMPARISON:  Left knee radiographs 11/22/2022, CT left knee 10/23/2022 FINDINGS: There is diffuse decreased bone mineralization. There is again an oblique fracture extending from the lateral cortex of the proximal tibial metaphysis proximally and medially into the medial tibial spine and medial tibial plateau, better seen on prior CT. There is minimal healing sclerosis around these fracture lines, similar to 11/22/2022. There is also sclerosis at the medial tibial plateau where there is a mildly depressed fracture better seen on prior CT. Tiny joint effusion, similar to 11/22/2022 and again decreased from 10/23/2022. Chondrocalcinosis is again seen within the medial and lateral compartments. Moderate atherosclerotic calcifications. IMPRESSION: 1.  Healing proximal tibial subacute fractures, similar to 11/22/2022. Fracture lines again remain visible. 2. Tiny joint effusion, similar to 11/22/2022 and again decreased from 10/23/2022. Electronically Signed   By: Neita Garnet M.D.   On: 11/26/2022 12:09   DG Knee Complete 4 Views Left  Result Date: 11/22/2022 CLINICAL DATA:  Left tibial plateau fracture EXAM: LEFT KNEE - COMPLETE 4+ VIEW COMPARISON:  CT 10/23/2022 and radiographs 10/23/2022 FINDINGS: Compared with radiographs 10/23/2022, there is sclerosis in the medial tibial plateau compatible with healing fracture. Fracture lines extend into the tibial spines and lateral tibial metadiaphysis. Similar depression of the medial tibial plateau. Decreased joint effusion, now small. 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 Demineralization. No dislocation. IMPRESSION: Mild healing changes about the medial tibial plateau fracture. Fracture lines remain visible. Electronically Signed   By: Minerva Fester M.D.   On: 11/22/2022 17:40     Microbiology: Results for orders placed or performed during the hospital encounter of 11/16/22  Urine Culture     Status: Abnormal   Collection Time: 11/16/22  8:04 PM   Specimen: Urine, Clean Catch  Result Value Ref Range Status   Specimen Description URINE, CLEAN CATCH  Final   Special Requests   Final    NONE Performed at [facility], [address] [place], [place] [address]    Culture MULTIPLE SPECIES PRESENT, SUGGEST RECOLLECTION (A)  Final   Report Status 11/18/2022 FINAL  Final    Labs: CBC: Recent Labs  Lab 12/11/22 2219 12/12/22 0529  WBC 6.8 6.3  NEUTROABS 4.6  --   HGB 11.2* 11.4*  HCT 31.6* 33.0*  MCV 94.6 95.4  PLT 306 344   Basic Metabolic Panel: Recent Labs  Lab 12/11/22 2219 12/12/22 0529 12/12/22 0936 12/14/22 0522 12/14/22 1124 12/14/22 1527 12/14/22 2204 12/15/22 0525 12/16/22 0451 12/17/22 0505  NA 124* 130*   < > 126* 128* 127* 130* 134* 134* 133*  K 3.8 3.6   < > 3.9 4.4  --   --  4.0 3.9 4.0  CL 87* 93*   < > 88* 91*  --   --  95* 96* 96*  CO2 27 30   < > 28 28  --   --  29 29 30   GLUCOSE 96 85   < > 100* 120*  --   --  101* 137* 100*  BUN 39* 25*   < > 50* 76*  --   --  55* 41* 42*  CREATININE 0.58 0.50   < > 0.74 0.75  --   --  0.81 0.65 0.76  CALCIUM 9.1 8.9   < > 8.8* 9.1  --   --  8.9 9.3 9.1  MG 2.0 1.9  --   --   --   --   --   --   --   --   PHOS 3.7 4.1  --   --   --   --   --   --   --   --    < > = values in this interval not displayed.   Liver Function Tests: Recent Labs  Lab 12/11/22 2219 12/12/22 0529  AST 17 17  ALT 16 14  ALKPHOS 56 46  BILITOT 0.8 0.7  PROT 7.0 6.5  ALBUMIN 3.8 3.5   CBG: No results for input(s): "GLUCAP" in the last 168 hours.  Discharge time spent: greater than 30 minutes.  Signed: Alba Cory, MD [facility] 12/17/2022

## 2022-12-17 NOTE — TOC Progression Note (Addendum)
Transition of Care [facility]) - Progression Note    Patient Details  Name: Erica Ball MRN: [id number] Date of Birth: [birth year]  Transition of Care [facility]) CM/SW Contact  Howell Rucks, RN Phone Number: 12/17/2022, 12:05 PM  Clinical Narrative:  Call received from Tiffany with [employer], advised of peer to peer offer regarding request for short term rehab-SNF, phone [phone], opt 5, due by 4:00 EST today, 12/17/22, attending notified.   -2:54pm 8/12 PT notes updated and uploaded in [employer] per peer to peer request, await deterination    Expected Discharge Plan: Skilled Nursing Facility Barriers to Discharge: Continued Medical Work up  Expected Discharge Plan and Services   Discharge Planning Services: CM Consult   Living arrangements for the past 2 months: Single Family Home                                       Social Determinants of Health (SDOH) Interventions SDOH Screenings   Food Insecurity: No Food Insecurity (12/12/2022)  Housing: Low Risk  (12/12/2022)  Transportation Needs: No Transportation Needs (12/12/2022)  Utilities: Not At Risk (12/12/2022)  Alcohol Screen: Low Risk  (11/17/2022)  Depression (PHQ2-9): Low Risk  (11/17/2022)  Financial Resource Strain: Patient Unable To Answer (11/17/2022)  Physical Activity: Sufficiently Active (11/17/2022)  Social Connections: Socially Isolated (11/17/2022)  Stress: No Stress Concern Present (11/17/2022)  Tobacco Use: High Risk (12/11/2022)  Health Literacy: Inadequate Health Literacy (11/17/2022)    Readmission Risk Interventions    12/12/2022    4:34 PM  Readmission Risk Prevention Plan  Transportation Screening Complete  PCP or Specialist Appt within 3-5 Days Complete  HRI or Home Care Consult Complete  Social Work Consult for Recovery Care Planning/Counseling Complete  Palliative Care Screening Not Applicable  Medication Review Oceanographer) Complete

## 2022-12-17 NOTE — Progress Notes (Signed)
Physical Therapy Treatment Patient Details Name: Erica Ball MRN: [id number] DOB: Jun 16, 1950 Today's Date: 12/17/2022   History of Present Illness 72 year old with past medical history significant for hyponatremia secondary to SIADH, hypokalemia, UTI, history of alcohol abuse, hypertension, history of SVT in the setting of sepsis, hyperlipidemia, left knee fracture recent admission for hyponatremia discharged on sodium tablet, urea and Lasix.  Sent back from skilled nursing facility due to recurrent hyponatremia sodium level down to 19 at the rehab.  Sodium check in the ED was 124.    PT Comments  Pt agreeable to working with therapy. Total A to readjust KI. Updated pt on new WB recommendation of TDWB for L LE. Pt required Min A for mobility. She did fairly well with TDWB status on today. No pain reported during session. Patient will benefit from continued inpatient follow up therapy, <3 hours/day     If plan is discharge home, recommend the following: Assist for transportation;Help with stairs or ramp for entrance;A lot of help with walking and/or transfers;Assistance with cooking/housework   Can travel by private vehicle     No  Equipment Recommendations  None recommended by PT    Recommendations for Other Services       Precautions / Restrictions Precautions Precautions: Fall Required Braces or Orthoses: Knee Immobilizer - Left Knee Immobilizer - Left: On at all times Restrictions Weight Bearing Restrictions: Yes RLE Weight Bearing: Weight bearing as tolerated (with post op shoe) LLE Weight Bearing: Touchdown weight bearing     Mobility  Bed Mobility Overal bed mobility: Needs Assistance Bed Mobility: Supine to Sit, Sit to Supine Rolling: Modified independent (Device/Increase time)   Supine to sit: Modified independent (Device/Increase time)     General bed mobility comments: Increased time.    Transfers Overall transfer level: Needs assistance Equipment used:  Rolling walker (2 wheels) Transfers: Sit to/from Stand Sit to Stand: Min assist, From elevated surface           General transfer comment: Cues for safety, hand placement, adherence to TDWB status. Assist to power up and steady.    Ambulation/Gait Ambulation/Gait assistance: Min assist Gait Distance (Feet): 8 Feet Assistive device: Rolling walker (2 wheels) Gait Pattern/deviations: Step-to pattern       General Gait Details: Cues for safety, technique, sequence, adherence to TDWB status. Assist to stabilize throughout short distance. Pt did fairly well maintaining TDWB status.   Stairs             Wheelchair Mobility     Tilt Bed    Modified Rankin (Stroke Patients Only)       Balance Overall balance assessment: Needs assistance, History of Falls         Standing balance support: During functional activity, Bilateral upper extremity supported, Reliant on assistive device for balance Standing balance-Leahy Scale: Fair                              Cognition Arousal: Alert Behavior During Therapy: WFL for tasks assessed/performed Overall Cognitive Status: Within Functional Limits for tasks assessed                                          Exercises      General Comments        Pertinent Vitals/Pain Pain Assessment Pain Assessment: No/denies pain  Home Living                          Prior Function            PT Goals (current goals can now be found in the care plan section) Progress towards PT goals: Progressing toward goals    Frequency    Min 1X/week      PT Plan Current plan remains appropriate    Co-evaluation              AM-PAC PT "6 Clicks" Mobility   Outcome Measure  Help needed turning from your back to your side while in a flat bed without using bedrails?: None Help needed moving from lying on your back to sitting on the side of a flat bed without using bedrails?:  None Help needed moving to and from a bed to a chair (including a wheelchair)?: A Little Help needed standing up from a chair using your arms (e.g., wheelchair or bedside chair)?: A Little Help needed to walk in hospital room?: A Little Help needed climbing 3-5 steps with a railing? : Total 6 Click Score: 18    End of Session Equipment Utilized During Treatment: Gait belt;Left knee immobilizer Activity Tolerance: Patient tolerated treatment well Patient left: in bed;with call bell/phone within reach;with bed alarm set   PT Visit Diagnosis: Unsteadiness on feet (R26.81);Muscle weakness (generalized) (M62.81);History of falling (Z91.81);Difficulty in walking, not elsewhere classified (R26.2)     Time: 1610-9604 PT Time Calculation (min) (ACUTE ONLY): 15 min  Charges:    $Gait Training: 8-22 mins PT General Charges $$ ACUTE PT VISIT: 1 Visit                         Faye Ramsay, PT Acute Rehabilitation  Office: [phone]

## 2022-12-17 NOTE — Progress Notes (Signed)
Mobility Specialist - Progress Note   12/17/22 1029  Mobility  Activity Transferred from bed to chair  Level of Assistance Standby assist, set-up cues, supervision of patient - no hands on  Assistive Device Front wheel walker  Distance Ambulated (ft) 2 ft  RLE Weight Bearing WBAT  LLE Weight Bearing NWB  Activity Response Tolerated well  Mobility Referral Yes  $Mobility charge 1 Mobility  Mobility Specialist Start Time (ACUTE ONLY) 1013  Mobility Specialist Stop Time (ACUTE ONLY) 1029  Mobility Specialist Time Calculation (min) (ACUTE ONLY) 16 min   Pt received in bed and agreeable to transfer to recliner. No complaints during transfer. Pt to recliner after session with all needs met. Chair alarm on.     [facility]

## 2022-12-18 DIAGNOSIS — E871 Hypo-osmolality and hyponatremia: Secondary | ICD-10-CM | POA: Diagnosis not present

## 2022-12-18 NOTE — Plan of Care (Signed)

## 2022-12-18 NOTE — Discharge Summary (Signed)
Physician Discharge Summary   Patient: Erica Ball MRN: [id number] DOB: 1951-04-18  Admit date:     12/11/2022  Discharge date: 12/18/22  Discharge Physician: Alba Cory   PCP: Patient, No Pcp Per   Recommendations at discharge:    Patient needs to follow 1.2 L fluid restriction.  Avoid Gatorade.  Patient will need to continue Urea 15 g BID>  Needs B-met to follow sodium level in 48 hours, send  results to nephrologist Dr Brooke Bonito Office.  Keep Appointment with Orthopedic on Thursday. Appointment with neurologist on Wednesday.  Recommendation for weightbearing have been change to Touch down weight bearing to left LE with Walker.  Of note, lasix has been discontinue.  Ok for patient to follow regular diet, ok to add salt to her meals.   Patient has been approved for rehab, stable. Sodium 135 today.   Discharge Diagnoses: Principal Problem:   Hyponatremia Active Problems:   Alcohol abuse   Hypertension   COPD GOLD 0 / still smoking   Resolved Problems:   * No resolved hospital problems. *  Hospital Course: 72 year old with past medical history significant for hyponatremia secondary to SIADH, hypokalemia, UTI, history of alcohol abuse, hypertension, history of SVT in the setting of sepsis, hyperlipidemia, left knee fracture recent admission for hyponatremia discharged on sodium tablet, urea and Lasix.  Sent back from skilled nursing facility due to recurrent hyponatremia sodium level down to 19 at the rehab.  Sodium check in the ED was 124. Repeated sodium this morning 130.  Will continue with water restriction resume urea and sodium tablets and Lasix.  She was taking Gatorade at the facility will probably have to hold on that and just do water restriction Case discussed with nephrologist   Sodium down to 126--128. Nephrologist has been consulted. Patient hyponatremia this admission improved with IV fluids. Lasix has been discontinue. Urea dose was change to 15 gr BID>  continue with water restriction 1.2 L.     Assessment and Plan: 1-Recurrent Hyponatremia:  -Per skilled nursing facilities repeated sodium at rehab was 119.  Repeated sodium in the ED was 124.  Sodium has increased to 130 with water restriction. -Case discussed with nephrology on-call 8/07, recommend at that time resumption of  sodium tablet, urea and Lasix.  Continue to monitor sodium.  -Nephrology consulted for persistent hyponatremia.  -Appreciate Dr Arlean Hopping assistance.  -Patient was started on NS IV fluids. Sodium has improved to 134/ Lasix and sodium tablet was discontinue, Urea reduce to 15 g BID>  -Nephrologist impression for hyponatremia this admission was related to hypovolemia/dehydration in setting of lasix. He think patient has Low Solute state, rather than SIADH. Encourage protein intake.  Cortisol 9 Sodium stable 135 Could add Sodium Tablet out patient as needed.   2-Hypertension: Continue with  Toprol, hydralazine Lasix has been discontinue.  Post medication SBP 140---130   COPD: Stable continue with nebulizers Alcohol abuse, history of currently in remission Hypokalemia; Replaced.    Right Foot Bruise, toes, Repeated X ray 8/09 negative for fracture.  Per son she didn't had bruise on that foot.  She did had a CT right foot was negative for fracture in June     Left Closed Tibial Plateau Fracture,  Left Ankle Fracture.  Diagnosed during admission 6/23--7/01. Left Knee immobilizer and NWB on left.  Send Message to Dr Odis Hollingshead, and informed him of patient readmission. He ordered CT tibia and will review and will give [place] recommendations.  CT:  No definite acute  fracture. No dislocation. Very limited exam duemto severe osteopenia. Mildly depressed fracture of the medial tibial plateau and nondisplaced fracture of the anterior cortex of the proximal tibia as seen on the prior CT. Dr Rhona Leavens reviewed CT, patient is healing well. He change weightbearing to TDWB with  walker.   Estimated body mass index is 19.21 kg/m as calculated from the following:   Height as of this encounter: 5\' 8"  (1.727 m).   Weight as of this encounter: 57.3 kg.            Consultants: Ortho, nephrology  Procedures performed: None Disposition: Skilled nursing facility Diet recommendation:  Regular diet DISCHARGE MEDICATION: Allergies as of 12/18/2022       Reactions   Dextromethorphan Other (See Comments)   Makes her very angry and she wants to hurt people        Medication List     STOP taking these medications    Fleet Saline Enema 7-19 GM/197ML Enem   furosemide 40 MG tablet Commonly known as: LASIX   magnesium hydroxide 400 MG/5ML suspension Commonly known as: MILK OF MAGNESIA   potassium chloride SA 20 MEQ tablet Commonly known as: KLOR-CON M   sodium chloride 1 g tablet       TAKE these medications    albuterol 108 (90 Base) MCG/ACT inhaler Commonly known as: VENTOLIN HFA Inhale 1-2 puffs into the lungs every 4 (four) hours as needed for shortness of breath.   aspirin EC 81 MG tablet Take 1 tablet (81 mg total) by mouth daily.   bisacodyl 10 MG suppository Commonly known as: DULCOLAX Place 10 mg rectally as needed for moderate constipation.   cyanocobalamin 1000 MCG tablet Take 1 tablet (1,000 mcg total) by mouth daily.   feeding supplement Liqd Take 237 mLs by mouth 2 (two) times daily between meals. What changed: when to take this   folic acid 1 MG tablet Commonly known as: FOLVITE Take 1 tablet (1 mg total) by mouth daily.   hydrALAZINE 25 MG tablet Commonly known as: APRESOLINE Take 25 mg by mouth 3 (three) times daily.   magnesium oxide 400 (240 Mg) MG tablet Commonly known as: MAG-OX Take 1 tablet (400 mg total) by mouth daily.   metoprolol succinate 100 MG 24 hr tablet Commonly known as: Toprol XL Take 1 tablet (100 mg total) by mouth daily. Take with or immediately following a meal.   ondansetron 4 MG  tablet Commonly known as: ZOFRAN Take 1 tablet (4 mg total) by mouth every 6 (six) hours as needed for nausea.   pantoprazole 40 MG tablet Commonly known as: PROTONIX Take 1 tablet (40 mg total) by mouth daily.   rosuvastatin 10 MG tablet Commonly known as: CRESTOR Take 1 tablet (10 mg total) by mouth daily.   thiamine 100 MG tablet Commonly known as: Vitamin B-1 Take 1 tablet (100 mg total) by mouth daily.   urea 15 g Pack oral packet Commonly known as: URE-NA Take 15 g by mouth 2 (two) times daily. What changed:  when to take this Another medication with the same name was removed. Continue taking this medication, and follow the directions you see here.               Discharge Care Instructions  (From admission, onward)           Start     Ordered   12/18/22 0000  Discharge wound care:       Comments: See above  12/18/22 1610            Follow-up Information     Netta Cedars, MD Follow up in 1 week(s).   Specialty: Orthopedic Surgery Contact information: [address], [address] [place] [address] [phone]                Discharge Exam: Ceasar Mons Weights   12/16/22 0540 12/17/22 0610 12/18/22 0439  Weight: 57.3 kg 57 kg 54.7 kg   General; NAD  Condition at discharge: stable  The results of significant diagnostics from this hospitalization (including imaging, microbiology, ancillary and laboratory) are listed below for reference.   Imaging Studies: CT TIBIA FIBULA LEFT WO CONTRAST  Result Date: 12/14/2022 CLINICAL DATA:  Fracture. EXAM: CT OF THE LOWER LEFT EXTREMITY WITHOUT CONTRAST TECHNIQUE: Multidetector CT imaging of the lower left extremity was performed according to the standard protocol. RADIATION DOSE REDUCTION: This exam was performed according to the departmental dose-optimization program which includes automated exposure control, adjustment of the mA and/or kV according to patient size and/or use of iterative  reconstruction technique. COMPARISON:  CT of the left ankle dated 10/28/2022. left knee CT dated 10/23/2022. FINDINGS: Bones/Joint/Cartilage Mildly depressed fracture of the medial tibial plateau and nondisplaced fracture of the anterior cortex of the proximal tibia as seen on the prior CT. No other acute fracture. Evaluation for fracture however is very limited due to advanced osteopenia. The previously seen fracture of the lateral malleolus is poorly visualized on today's exam. There is no dislocation. Ligaments Suboptimally assessed by CT. Muscles and Tendons No acute findings. Soft tissues No acute findings. IMPRESSION: 1. No definite acute fracture. No dislocation. Very limited exam due to severe osteopenia. 2. Mildly depressed fracture of the medial tibial plateau and nondisplaced fracture of the anterior cortex of the proximal tibia as seen on the prior CT. Electronically Signed   By: Elgie Collard M.D.   On: 12/14/2022 20:44   DG Foot Complete Right  Result Date: 12/14/2022 CLINICAL DATA:  Bruce EXAM: RIGHT FOOT COMPLETE - 3+ VIEW COMPARISON:  Right foot x-ray 10/23/2022 FINDINGS: The bones are diffusely osteopenic. There is no acute fracture or dislocation identified. There are healed third and fourth metacarpal neck fractures. There is mild hallux valgus. Orthopedic hardware is seen in the lateral malleolus and medial malleolus. Soft tissues are within normal limits. IMPRESSION: 1. No acute fracture or dislocation identified. 2. Diffuse osteopenia. 3. Mild hallux valgus. Electronically Signed   By: Darliss Cheney M.D.   On: 12/14/2022 18:25   DG CHEST PORT 1 VIEW  Result Date: 12/12/2022 CLINICAL DATA:  Hyponatremia. EXAM: PORTABLE CHEST 1 VIEW COMPARISON:  November 16, 2022 FINDINGS: The heart size and mediastinal contours are within normal limits. There is marked severity calcification of the thoracic aorta. Both lungs are clear. A chronic appearing deformity is seen involving the distal right  clavicle. Multilevel degenerative changes are seen throughout the thoracic spine. IMPRESSION: No active cardiopulmonary disease. Electronically Signed   By: Aram Candela M.D.   On: 12/12/2022 00:20   DG Knee 1-2 Views Left  Result Date: 11/26/2022 CLINICAL DATA:  Left knee pain for 1 month. Known left tibial plateau fracture. EXAM: LEFT KNEE - 1-2 VIEW COMPARISON:  Left knee radiographs 11/22/2022, CT left knee 10/23/2022 FINDINGS: There is diffuse decreased bone mineralization. There is again an oblique fracture extending from the lateral cortex of the proximal tibial metaphysis proximally and medially into the medial tibial spine and medial tibial plateau, better seen on prior  CT. There is minimal healing sclerosis around these fracture lines, similar to 11/22/2022. There is also sclerosis at the medial tibial plateau where there is a mildly depressed fracture better seen on prior CT. Tiny joint effusion, similar to 11/22/2022 and again decreased from 10/23/2022. Chondrocalcinosis is again seen within the medial and lateral compartments. Moderate atherosclerotic calcifications. IMPRESSION: 1. Healing proximal tibial subacute fractures, similar to 11/22/2022. Fracture lines again remain visible. 2. Tiny joint effusion, similar to 11/22/2022 and again decreased from 10/23/2022. Electronically Signed   By: Neita Garnet M.D.   On: 11/26/2022 12:09   DG Knee Complete 4 Views Left  Result Date: 11/22/2022 CLINICAL DATA:  Left tibial plateau fracture EXAM: LEFT KNEE - COMPLETE 4+ VIEW COMPARISON:  CT 10/23/2022 and radiographs 10/23/2022 FINDINGS: Compared with radiographs 10/23/2022, there is sclerosis in the medial tibial plateau compatible with healing fracture. Fracture lines extend into the tibial spines and lateral tibial metadiaphysis. Similar depression of the medial tibial plateau. Decreased joint effusion, now small. 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 Demineralization. No  dislocation. IMPRESSION: Mild healing changes about the medial tibial plateau fracture. Fracture lines remain visible. Electronically Signed   By: Minerva Fester M.D.   On: 11/22/2022 17:40    Microbiology: Results for orders placed or performed during the hospital encounter of 11/16/22  Urine Culture     Status: Abnormal   Collection Time: 11/16/22  8:04 PM   Specimen: Urine, Clean Catch  Result Value Ref Range Status   Specimen Description URINE, CLEAN CATCH  Final   Special Requests   Final    NONE Performed at [facility], [address] [place], [place] [address]    Culture MULTIPLE SPECIES PRESENT, SUGGEST RECOLLECTION (A)  Final   Report Status 11/18/2022 FINAL  Final    Labs: CBC: Recent Labs  Lab 12/11/22 2219 12/12/22 0529  WBC 6.8 6.3  NEUTROABS 4.6  --   HGB 11.2* 11.4*  HCT 31.6* 33.0*  MCV 94.6 95.4  PLT 306 344   Basic Metabolic Panel: Recent Labs  Lab 12/11/22 2219 12/12/22 0529 12/12/22 0936 12/14/22 1124 12/14/22 1527 12/14/22 2204 12/15/22 0525 12/16/22 0451 12/17/22 0505 12/18/22 0516  NA 124* 130*   < > 128*   < > 130* 134* 134* 133* 135  K 3.8 3.6   < > 4.4  --   --  4.0 3.9 4.0 4.5  CL 87* 93*   < > 91*  --   --  95* 96* 96* 96*  CO2 27 30   < > 28  --   --  29 29 30 30   GLUCOSE 96 85   < > 120*  --   --  101* 137* 100* 104*  BUN 39* 25*   < > 76*  --   --  55* 41* 42* 41*  CREATININE 0.58 0.50   < > 0.75  --   --  0.81 0.65 0.76 0.75  CALCIUM 9.1 8.9   < > 9.1  --   --  8.9 9.3 9.1 9.1  MG 2.0 1.9  --   --   --   --   --   --   --   --   PHOS 3.7 4.1  --   --   --   --   --   --   --   --    < > = values in this interval not displayed.   Liver Function Tests:  Recent Labs  Lab 12/11/22 2219 12/12/22 0529  AST 17 17  ALT 16 14  ALKPHOS 56 46  BILITOT 0.8 0.7  PROT 7.0 6.5  ALBUMIN 3.8 3.5   CBG: No results for input(s): "GLUCAP" in the last 168 hours.  Discharge time spent: greater than 30 minutes.  Signed: Alba Cory, MD [facility] 12/18/2022

## 2022-12-18 NOTE — TOC Progression Note (Signed)
Transition of Care [facility]) - Progression Note    Patient Details  Name: Erica Ball MRN: [id number] Date of Birth: May 13, 1950  Transition of Care [facility]) CM/SW Contact  Howell Rucks, RN Phone Number: 12/18/2022, 9:36 AM  Clinical Narrative:   Per Fransico Him, short term rehab SNF approved, days approved 8/9-8/13/2021,  Plan Auth ID: [id number]. Team notified.     Expected Discharge Plan: Skilled Nursing Facility Barriers to Discharge: Continued Medical Work up  Expected Discharge Plan and Services   Discharge Planning Services: CM Consult   Living arrangements for the past 2 months: Single Family Home                                       Social Determinants of Health (SDOH) Interventions SDOH Screenings   Food Insecurity: No Food Insecurity (12/12/2022)  Housing: Low Risk  (12/12/2022)  Transportation Needs: No Transportation Needs (12/12/2022)  Utilities: Not At Risk (12/12/2022)  Alcohol Screen: Low Risk  (11/17/2022)  Depression (PHQ2-9): Low Risk  (11/17/2022)  Financial Resource Strain: Patient Unable To Answer (11/17/2022)  Physical Activity: Sufficiently Active (11/17/2022)  Social Connections: Socially Isolated (11/17/2022)  Stress: No Stress Concern Present (11/17/2022)  Tobacco Use: High Risk (12/11/2022)  Health Literacy: Inadequate Health Literacy (11/17/2022)    Readmission Risk Interventions    12/12/2022    4:34 PM  Readmission Risk Prevention Plan  Transportation Screening Complete  PCP or Specialist Appt within 3-5 Days Complete  HRI or Home Care Consult Complete  Social Work Consult for Recovery Care Planning/Counseling Complete  Palliative Care Screening Not Applicable  Medication Review Oceanographer) Complete

## 2022-12-18 NOTE — Progress Notes (Unsigned)
[facility] [address] [place]. [place] [address] [phone]       HOSPITAL FOLLOW UP NOTE  [name] Date of Birth:  1950/09/30 Medical Record Number:  [id number]   Reason for Referral:  hospital stroke follow up    SUBJECTIVE:   CHIEF COMPLAINT:  No chief complaint on file.   HPI:   Erica Ball is a 72 y.o. who  has a past medical history of Acute kidney injury (HCC) (07/10/2017), Acute respiratory failure with hypoxia (HCC) (07/10/2017), Alcohol abuse, Allergy, Cataract, Diverticulitis (10/30/2011), Elevated troponin (07/10/2017), Hypertension, Lobar pneumonia (HCC) (07/10/2017), Pleural effusion on right (07/10/2017), Sepsis (HCC) (07/10/2017), SVT (supraventricular tachycardia), Tobacco abuse, UTI (urinary tract infection) (07/10/2017), and Vitamin D deficiency (10/30/2011).  Patient presented on 10/23/2022 following a fall. She was diagnosed with left tibial plateau fracture. Ortho recommended non operative management. She was treated with CIWA protocol and librium taper. PT/OT eval recommended SNF. Prior to discharge 6/24, she developed worsening lethargy. MRI showed bilateral acute/subacute subcortical infarcts. EEG showed slowing right hemisphere but no seizure. She has been hospitalized twice, 7/12 and 8/6, for hyponatremia. She was discharged to [facility] 8/12. Personally reviewed hospitalization pertinent progress notes, lab work and imaging.  Evaluated by Roda Shutters.   Since discharge,   Primary care?   Tibial fracture Tobacco use Alcohol use  PERTINENT IMAGING/LABS  CT head chronic right pontine, b/l periventricular WM and subcortical infarcts. MRI small scattered subacute lacunar infarct bilaterally CTA head and neck no intracranial LVO, mild stenosis right V1, no other hemodynamically significant stenosis in the neck 2D Echo EF 60-65%   A1C Lab Results  Component Value Date   HGBA1C 4.7 (L) 10/24/2022    Lipid Panel      Component Value Date/Time   CHOL 118 10/30/2022 0058   TRIG 46 10/30/2022 0058   HDL 47 10/30/2022 0058   CHOLHDL 2.5 10/30/2022 0058   VLDL 9 10/30/2022 0058   LDLCALC 62 10/30/2022 0058      ROS:   14 system review of systems performed and negative with exception of those listed in HPI  PMH:  Past Medical History:  Diagnosis Date   Acute kidney injury (HCC) 07/10/2017   Acute respiratory failure with hypoxia (HCC) 07/10/2017   Alcohol abuse    Allergy    Cataract    Diverticulitis 10/30/2011   Elevated troponin 07/10/2017   Hypertension    Lobar pneumonia (HCC) 07/10/2017   Pleural effusion on right 07/10/2017   Sepsis (HCC) 07/10/2017   SVT (supraventricular tachycardia)    Tobacco abuse    UTI (urinary tract infection) 07/10/2017   Vitamin D deficiency 10/30/2011    PSH:  Past Surgical History:  Procedure Laterality Date   ABDOMINAL HYSTERECTOMY     CATARACT EXTRACTION     FRACTURE SURGERY     IR THORACENTESIS ASP PLEURAL SPACE W/IMG GUIDE  07/11/2017   VIDEO ASSISTED THORACOSCOPY (VATS)/EMPYEMA Right 07/12/2017   Procedure: right VIDEO ASSISTED THORACOSCOPY (VATS) for drainage of EMPYEMA and decortication;  Surgeon: Loreli Slot, MD;  Location: [facility] OR;  Service: Thoracic;  Laterality: Right;   VIDEO BRONCHOSCOPY N/A 07/12/2017   Procedure: VIDEO BRONCHOSCOPY;  Surgeon: Loreli Slot, MD;  Location: [facility] OR;  Service: Thoracic;  Laterality: N/A;    Social History:  Social History   Socioeconomic History   Marital status: Divorced    Spouse name: Not on file   Number of children: Not on file   Years of education:  Not on file   Highest education level: Not on file  Occupational History   Not on file  Tobacco Use   Smoking status: Every Day    Current packs/day: 1.50    Average packs/day: 1.5 packs/day for 53.0 years (79.5 ttl pk-yrs)    Types: Cigarettes   Smokeless tobacco: Never  Substance and Sexual Activity   Alcohol use: Yes     Alcohol/week: 21.0 standard drinks of alcohol    Types: 21 Shots of liquor per week    Comment: drinks three cups of vodka per day   Drug use: No   Sexual activity: Not Currently  Other Topics Concern   Not on file  Social History Narrative   Not on file   Social Determinants of Health   Financial Resource Strain: Patient Unable To Answer (11/17/2022)   Overall Financial Resource Strain (CARDIA)    Difficulty of Paying Living Expenses: Patient unable to answer  Food Insecurity: No Food Insecurity (12/12/2022)   Hunger Vital Sign    Worried About Running Out of Food in the Last Year: Never true    Ran Out of Food in the Last Year: Never true  Transportation Needs: No Transportation Needs (12/12/2022)   PRAPARE - Administrator, Civil Service (Medical): No    Lack of Transportation (Non-Medical): No  Physical Activity: Sufficiently Active (11/17/2022)   Exercise Vital Sign    Days of Exercise per Week: 7 days    Minutes of Exercise per Session: 30 min  Stress: No Stress Concern Present (11/17/2022)   [employer] of Occupational Health - Occupational Stress Questionnaire    Feeling of Stress : Only a little  Social Connections: Socially Isolated (11/17/2022)   Social Connection and Isolation Panel [NHANES]    Frequency of Communication with Friends and Family: More than three times a week    Frequency of Social Gatherings with Friends and Family: Once a week    Attends Religious Services: Never    Database administrator or Organizations: No    Attends Banker Meetings: Never    Marital Status: Divorced  Catering manager Violence: Not At Risk (12/12/2022)   Humiliation, Afraid, Rape, and Kick questionnaire    Fear of Current or Ex-Partner: No    Emotionally Abused: No    Physically Abused: No    Sexually Abused: No    Family History:  Family History  Problem Relation Age of Onset   Hypertension Mother    Heart disease Mother    Hypertension Father     Heart disease Father    Stroke Brother    Hypertension Brother    Heart disease Brother     Medications:   Current Facility-Administered Medications on File Prior to Visit  Medication Dose Route Frequency Provider Last Rate Last Admin   0.9 %  sodium chloride infusion  250 mL Intravenous PRN Doutova, Jonny Ruiz, MD       acetaminophen (TYLENOL) tablet 650 mg  650 mg Oral Q6H PRN Therisa Doyne, MD   650 mg at 12/12/22 5284   Or   acetaminophen (TYLENOL) suppository 650 mg  650 mg Rectal Q6H PRN Doutova, Anastassia, MD       albuterol (PROVENTIL) (2.5 MG/3ML) 0.083% nebulizer solution 2.5 mg  2.5 mg Nebulization Q4H PRN Doutova, Anastassia, MD       aspirin EC tablet 81 mg  81 mg Oral Daily Doutova, Anastassia, MD   81 mg at 12/17/22 1040   enoxaparin (  LOVENOX) injection 40 mg  40 mg Subcutaneous Daily Regalado, Belkys A, MD   40 mg at 12/17/22 1041   feeding supplement (ENSURE ENLIVE / ENSURE PLUS) liquid 237 mL  237 mL Oral BID BM Regalado, Belkys A, MD   237 mL at 12/16/22 1405   folic acid (FOLVITE) tablet 1 mg  1 mg Oral Daily Doutova, Anastassia, MD   1 mg at 12/17/22 1040   hydrALAZINE (APRESOLINE) tablet 25 mg  25 mg Oral TID Regalado, Belkys A, MD   25 mg at 12/17/22 2226   HYDROcodone-acetaminophen (NORCO/VICODIN) 5-325 MG per tablet 1-2 tablet  1-2 tablet Oral Q4H PRN Doutova, Anastassia, MD       magnesium oxide (MAG-OX) tablet 400 mg  400 mg Oral Daily Doutova, Anastassia, MD   400 mg at 12/17/22 1040   metoprolol succinate (TOPROL-XL) 24 hr tablet 100 mg  100 mg Oral Daily Doutova, Anastassia, MD   100 mg at 12/16/22 0947   multivitamin with minerals tablet 1 tablet  1 tablet Oral Daily Regalado, Belkys A, MD   1 tablet at 12/17/22 1040   ondansetron (ZOFRAN) tablet 4 mg  4 mg Oral Q6H PRN Therisa Doyne, MD   4 mg at 12/16/22 0958   Or   ondansetron (ZOFRAN) injection 4 mg  4 mg Intravenous Q6H PRN Doutova, Anastassia, MD   4 mg at 12/14/22 0930   Oral care mouth  rinse  15 mL Mouth Rinse PRN Regalado, Belkys A, MD       pantoprazole (PROTONIX) EC tablet 40 mg  40 mg Oral Daily Doutova, Anastassia, MD   40 mg at 12/17/22 1042   rosuvastatin (CRESTOR) tablet 10 mg  10 mg Oral Daily Doutova, Anastassia, MD   10 mg at 12/17/22 1040   sodium chloride flush (NS) 0.9 % injection 3 mL  3 mL Intravenous Q12H Doutova, Anastassia, MD   3 mL at 12/17/22 2227   sodium chloride flush (NS) 0.9 % injection 3 mL  3 mL Intravenous PRN Doutova, Anastassia, MD       urea (URE-NA) oral packet 15 g  15 g Oral BID Delano Metz, MD   15 g at 12/17/22 2226   Current Outpatient Medications on File Prior to Visit  Medication Sig Dispense Refill   albuterol (PROVENTIL HFA;VENTOLIN HFA) 108 (90 Base) MCG/ACT inhaler Inhale 1-2 puffs into the lungs every 4 (four) hours as needed for shortness of breath.     aspirin EC 81 MG tablet Take 1 tablet (81 mg total) by mouth daily.     bisacodyl (DULCOLAX) 10 MG suppository Place 10 mg rectally as needed for moderate constipation.     cyanocobalamin 1000 MCG tablet Take 1 tablet (1,000 mcg total) by mouth daily. 30 tablet 1   feeding supplement (ENSURE ENLIVE / ENSURE PLUS) LIQD Take 237 mLs by mouth 2 (two) times daily between meals. 237 mL 12   folic acid (FOLVITE) 1 MG tablet Take 1 tablet (1 mg total) by mouth daily.     furosemide (LASIX) 40 MG tablet Take 1 tablet (40 mg total) by mouth daily. 30 tablet 1   hydrALAZINE (APRESOLINE) 25 MG tablet Take 25 mg by mouth 3 (three) times daily.     magnesium hydroxide (MILK OF MAGNESIA) 400 MG/5ML suspension Take 30 mLs by mouth daily as needed for mild constipation.     magnesium oxide (MAG-OX) 400 (240 Mg) MG tablet Take 1 tablet (400 mg total) by mouth daily. 15 tablet 0  metoprolol succinate (TOPROL XL) 100 MG 24 hr tablet Take 1 tablet (100 mg total) by mouth daily. Take with or immediately following a meal. 30 tablet 0   Nutritional Supplements (ENSURE ORIGINAL) LIQD Take 237 mLs by  mouth in the morning, at noon, and at bedtime.     ondansetron (ZOFRAN) 4 MG tablet Take 1 tablet (4 mg total) by mouth every 6 (six) hours as needed for nausea. 20 tablet 0   pantoprazole (PROTONIX) 40 MG tablet Take 1 tablet (40 mg total) by mouth daily. 30 tablet 1   potassium chloride SA (KLOR-CON M) 20 MEQ tablet Take 1 tablet (20 mEq total) by mouth daily. (Patient not taking: Reported on 12/12/2022) 30 tablet 1   rosuvastatin (CRESTOR) 10 MG tablet Take 1 tablet (10 mg total) by mouth daily.     sodium chloride 1 g tablet Take 1 g by mouth 2 (two) times daily with a meal.     Sodium Phosphates (FLEET SALINE ENEMA) 7-19 GM/197ML ENEM Place 1 Bottle rectally daily as needed (constipation).     thiamine (VITAMIN B-1) 100 MG tablet Take 1 tablet (100 mg total) by mouth daily.     urea (URE-NA) 15 g PACK oral packet Take 30 g by mouth daily. 30 packet 1   urea (URE-NA) 15 g PACK oral packet Take 15 g by mouth daily at 8 pm. 30 packet 0   urea (URE-NA) 15 g PACK oral packet Take 15 g by mouth 2 (two) times daily. 60 packet 0    Allergies:   Allergies  Allergen Reactions   Dextromethorphan Other (See Comments)    Makes her very angry and she wants to hurt people      OBJECTIVE:  Physical Exam  There were no vitals filed for this visit. There is no height or weight on file to calculate BMI. No results found.     11/17/2022    1:32 AM  Depression screen PHQ 2/9  Decreased Interest 1  Down, Depressed, Hopeless 1  PHQ - 2 Score 2  Altered sleeping 0  Tired, decreased energy 1  Change in appetite 1  Feeling bad or failure about yourself  0  Trouble concentrating 0  Moving slowly or fidgety/restless 0  Suicidal thoughts 0  PHQ-9 Score 4     General: well developed, well nourished, seated, in no evident distress Head: head normocephalic and atraumatic.   Neck: supple with no carotid or supraclavicular bruits Cardiovascular: regular rate and rhythm, no murmurs Musculoskeletal:  no deformity Skin:  no rash/petichiae Vascular:  Normal pulses all extremities   Neurologic Exam Mental Status: Awake and fully alert.  Fluent speech and language.  Oriented to place and time. Recent and remote memory intact. Attention span, concentration and fund of knowledge appropriate. Mood and affect appropriate.  Cranial Nerves: Fundoscopic exam reveals sharp disc margins. Pupils equal, briskly reactive to light. Extraocular movements full without nystagmus. Visual fields full to confrontation. Hearing intact. Facial sensation intact. Face, tongue, palate moves normally and symmetrically.  Motor: Normal bulk and tone. Normal strength in all tested extremity muscles Sensory.: intact to touch , pinprick , position and vibratory sensation.  Coordination: Rapid alternating movements normal in all extremities. Finger-to-nose and heel-to-shin performed accurately bilaterally. Gait and Station: Arises from chair without difficulty. Stance is normal. Gait demonstrates normal stride length and balance with ***. Tandem walk and heel toe ***.  Reflexes: 1+ and symmetric.    NIHSS  *** Modified Rankin  ***  ASSESSMENT: Erica Ball is a 72 y.o. year old female ***. Vascular risk factors include ***.      PLAN:  Subacute stroke, incidental finding: 4-5 bilateral small subcortical infarcts, L>R, most likely secondary to small vessel disease source: Residual deficit: ***. Continue aspirin 81 mg daily and rosuvastatin 10mg  daily for secondary stroke prevention.  Discussed secondary stroke prevention measures and importance of close PCP follow up for aggressive stroke risk factor management. I have gone over the pathophysiology of stroke, warning signs and symptoms, risk factors and their management in some detail with instructions to go to the closest emergency room for symptoms of concern. HTN: BP goal <130/90.  Stable on Cozaar and hydralazine per PCP HLD: LDL goal <70. Recent LDL 62.  Continue rosuvastatin 10mg  daily per PCP.  DMII: A1c goal<7.0. Recent A1c 4.7.  Hyponatremia: please continue to monitor labs closely. Limit fluids to 1.2L daily.  Tibial plateau fracture: continue close follow up with ortho for monitoring and non surgical management.  Tobacco use: continue cessation Alcohol abuse: continue cessation    Follow up in *** or call earlier if needed   CC:  [facility] provider: Dr. Pearlean Brownie PCP: Patient, No Pcp Per    I spent *** minutes of face-to-face and non-face-to-face time with patient.  This included previsit chart review including review of recent hospitalization, lab review, study review, order entry, electronic health record documentation, patient education regarding recent stroke including etiology, secondary stroke prevention measures and importance of managing stroke risk factors, residual deficits and typical recovery time and answered all other questions to patient satisfaction   Shawnie Dapper, [facility]  [facility] [address] [place], [place] [address]  Phone [phone] Fax [phone] Note: This document was prepared with digital dictation and possible smart phrase technology. Any transcriptional errors that result from this process are unintentional.

## 2022-12-18 NOTE — Patient Instructions (Incomplete)
Below is our plan:  Goals:  1) Maintain strict control of hypertension with blood pressure goal below 130/90 2) Maintain good control of diabetes with hemoglobin A1c goal below 7%  3) Maintain good control of lipids with LDL cholesterol goal below 70 mg/dL.  4) Eat a healthy diet with plenty of whole grains, cereals, fruits and vegetables, exercise regularly and maintain ideal body weight   Resources: https://www.stroke.org/en/about-stroke/stroke-risk-factors/risk-factors-under-your-control  Please make sure you are staying well hydrated. I recommend 50-60 ounces daily. Well balanced diet and regular exercise encouraged. Consistent sleep schedule with 6-8 hours recommended.   Please continue follow up with care team as directed.   Follow up with *** in ***  You may receive a survey regarding today's visit. I encourage you to leave honest feed back as I do use this information to improve patient care. Thank you for seeing me today!    

## 2022-12-18 NOTE — TOC Transition Note (Signed)
Transition of Care [facility]) - CM/SW Discharge Note   Patient Details  Name: Erica Ball MRN: [id number] Date of Birth: 1950-10-02  Transition of Care [facility] CM/SW Contact:  Howell Rucks, RN Phone Number: 12/18/2022, 10:40 AM   Clinical Narrative:  Pt to dc to [facility] for short term rehab, pt and pt's son Erica Ball notified. NCM call to [place], [place] Velna Hatchet, confirmed bed available to today, RM 210, nurse call report to main facility number at [phone], as to speak with nurse to give report. No further TOC needs identified.      Final next level of care: Skilled Nursing Facility Barriers to Discharge: Barriers Resolved   Patient Goals and CMS Choice CMS Medicare.gov Compare Post Acute Care list provided to:: Patient Choice offered to / list presented to : Patient  Discharge Placement                Patient chooses bed at: [facility] Patient to be transferred to facility by: [facility] Name of family member notified: Erica Ball (son) Patient and family notified of of transfer: 12/18/22  Discharge Plan and Services Additional resources added to the After Visit Summary for     Discharge Planning Services: CM Consult                                 Social Determinants of Health (SDOH) Interventions SDOH Screenings   Food Insecurity: No Food Insecurity (12/12/2022)  Housing: Low Risk  (12/12/2022)  Transportation Needs: No Transportation Needs (12/12/2022)  Utilities: Not At Risk (12/12/2022)  Alcohol Screen: Low Risk  (11/17/2022)  Depression (PHQ2-9): Low Risk  (11/17/2022)  Financial Resource Strain: Patient Unable To Answer (11/17/2022)  Physical Activity: Sufficiently Active (11/17/2022)  Social Connections: Socially Isolated (11/17/2022)  Stress: No Stress Concern Present (11/17/2022)  Tobacco Use: High Risk (12/11/2022)  Health Literacy: Inadequate Health Literacy (11/17/2022)     Readmission Risk Interventions    12/12/2022    4:34 PM  Readmission  Risk Prevention Plan  Transportation Screening Complete  PCP or Specialist Appt within 3-5 Days Complete  HRI or Home Care Consult Complete  Social Work Consult for Recovery Care Planning/Counseling Complete  Palliative Care Screening Not Applicable  Medication Review Oceanographer) Complete

## 2022-12-19 ENCOUNTER — Telehealth: Payer: Self-pay | Admitting: Family Medicine

## 2022-12-19 ENCOUNTER — Encounter: Payer: Self-pay | Admitting: Family Medicine

## 2022-12-19 ENCOUNTER — Ambulatory Visit (INDEPENDENT_AMBULATORY_CARE_PROVIDER_SITE_OTHER): Payer: Medicare HMO | Admitting: Family Medicine

## 2022-12-19 VITALS — BP 142/77 | HR 61

## 2022-12-19 DIAGNOSIS — E871 Hypo-osmolality and hyponatremia: Secondary | ICD-10-CM | POA: Diagnosis not present

## 2022-12-19 DIAGNOSIS — I639 Cerebral infarction, unspecified: Secondary | ICD-10-CM | POA: Diagnosis not present

## 2022-12-19 DIAGNOSIS — I10 Essential (primary) hypertension: Secondary | ICD-10-CM | POA: Diagnosis not present

## 2022-12-19 NOTE — Telephone Encounter (Signed)
 Referral for primary care fax to [facility] Phone: [phone], Fax: [phone]

## 2022-12-27 LAB — LAB REPORT - SCANNED: EGFR: 91

## 2023-01-20 NOTE — Progress Notes (Unsigned)
Cardiology Clinic Note   Date: 01/21/2023 ID: Erica, Ball 1950/09/12, MRN [id number]  Primary Cardiologist:  Thurmon Fair, MD  Patient Profile    Erica Ball is a 72 y.o. female who presents to the clinic today for hospital follow up.     Past medical history significant for: SVT. Demand ischemia. Echo 10/30/2022: EF 60 to 65%.  No RWMA.  Grade 1 DD.  Normal RV function.  Mild LAE.  Mild to moderate AI. Hypertension. COPD. Tobacco abuse. Alcohol use. SIADH.     History of Present Illness    Erica Ball has a remote history of being seen by Dr. Eden Emms 1 time in the outpatient setting in August 2019 following a hospital admission for URI, COPD exacerbation, and empyema requiring VATS.  While she was at the hospital at that time she had SVT with rates in the 180s that converted with IV metoprolol.  Echo was reassuring.  She had elevated troponin suspected to be due to demand ischemia.  Subsequent stress test August 2019 was low risk with no ischemia identified and a small defect of mild severity, nonreversible present in the apex location.  She had no further visits with cardiology.  Most recently, patient was evaluated by Dr. Royann Shivers on 11/01/2022 for SVT during hospital admission.  Patient presented to the ED on 10/23/2022 with a fall.  She was backing up to her sofa with her walker missed the seat and fell to the carpet.  She laid there for several hours then called her brother who assisted her back to the couch.  The next morning her foot and knee were swollen so she presented to the ED and was found to have a left tibial plateau fracture, left ankle fracture, right metatarsal fracture.  It was noted that all injuries would be managed nonoperatively.  Hospital course was noted for acute encephalopathy, EtOH abuse requiring Librium taper, subacute strokes seen by neuro recommending aspirin, hyponatremia.  10/30/2022 patient had an episode of SVT from 3:30 AM to 10  AM associated with minimal troponin elevation 45>> 126>> 134 without obvious chest pain.  She received 2 doses of IV metoprolol and spontaneously converted.  She had 1 brief recurrence of SVT on 10/31/2022 for which she received IV metoprolol.  Echo showed EF 60 to 65%, grade 1 DD, mild LAE, mild to moderate AI.  On the morning of 11/01/2022 she went back into narrow complex SVT with rates in the 160s with IV metoprolol breaking the arrhythmia but soon went back into SVT with heart rates in the 140s to 150s.  Plan to treat with escalating doses of oral beta-blocker until satisfactory control.  It was felt increased burden likely driven by EtOH withdrawal and pain from injuries.  She was eventually transition to Toprol-XL 100 mg daily.  She was discharged to SNF on 11/05/2022.  Patient returned to ED on 11/16/2022 from [facility] with hyponatremia, sodium 120.  Repeat sodium 119.  UA positive for pyuria.  Patient was given IV fluids and Rocephin and was admitted.  Hyponatremia was persistent and nephrology was consulted.  Patient was started on urea.  Sodium subsequently improved to 135.  Patient was discharged back to SNF on 11/28/2022.  Patient returned to ED on 12/11/2022 for hyponatremia.  Sodium was 119.  Patient reported she had increased her sodium intake and was drinking 3 Gatorade's per day.  Per [facility] sent by SNF patient had not missed any doses of urea.  Nephrology felt  this was related to SIADH and recommended fluid restriction.  Discharged on 12/18/2022 with instructions for 1.2 L fluid restriction, avoid Gatorade, continue urea, avoid Lasix.  Today, patient is accompanied by her brother and sister. She feels she is doing well from a cardiac standpoint. Patient denies shortness of breath, dyspnea on exertion, lower extremity edema, orthopnea or PND. No chest pain, pressure, or tightness. No palpitations. She is still residing at the SNF. She is doing rehab for her knee. She continues to be followed by  nephrology for hyponatremia and family report patient is going to be referred to endocrinology to help determine what is causing her hyponatremia. Patient receives IV fluids and takes urea to keep her sodium up.     ROS: All other systems reviewed and are otherwise negative except as noted in History of Present Illness.  Studies Reviewed      EKG is not ordered today.     Physical Exam    VS:  BP (!) 140/70 (BP Location: Left Arm, Patient Position: Sitting, Cuff Size: Normal)   Pulse 64   Ht 5\' 8"  (1.727 m)   Wt 130 lb (59 kg)   SpO2 96%   BMI 19.77 kg/m  , BMI Body mass index is 19.77 kg/m.  GEN: Frail appearing, well developed, in no acute distress. Neck: No JVD or carotid bruits. Cardiac:  RRR. No murmurs. No rubs or gallops.   Respiratory:  Respirations regular and unlabored. Diminished breath sounds bilaterally without rales, wheezing or rhonchi. GI: Soft, nontender, nondistended. Extremities: Radials/DP/PT 2+ and equal bilaterally. No clubbing or cyanosis. Mild edema to left (fractured) ankle. No edema right lower extremity.   Skin: Warm and dry, no rash. Neuro: Strength intact. Speech slow.  Assessment & Plan    SVT.  Longtime history of SVT.  Most recently in the setting of multiple fractures after a fall and EtOH withdrawal during hospital admission.  Patient reports she has experienced palpitations in the past but none recently. RRR on exam today.  Continue Toprol. Hypertension. BP today 140/70. Patient denies headaches, dizziness or vision changes. Continue, Toprol. Tobacco abuse. Patient has not smoked since going into the hospital. She is not happy about it and it sounds like she plans on going back to it as soon as she can.  SIADH.  Patient with 2 hospital admissions in July and August for hyponatremia secondary to SIADH.   Continue urea, fluid restriction of 1.2 L, liberalizing salt intake. Patient is being followed very closely by nephrology and family reports  endocrinology will be involved as well. Will not do lab work today, as patient has had multiple lab draws at the nursing home and with nephrology. She has a visit scheduled for next week.   Disposition: Return in 6 months or sooner as needed.  Given patient's multiple visits with nephrology will extend follow up out 6 months. Patient and family understand to contact the office sooner if she has sustained palpitations or any other cardiac concerns.          Signed, Etta Grandchild. Irena Gaydos, DNP, NP-C

## 2023-01-21 ENCOUNTER — Ambulatory Visit: Payer: Medicare HMO | Attending: Physician Assistant | Admitting: Student

## 2023-01-21 ENCOUNTER — Encounter: Payer: Self-pay | Admitting: Student

## 2023-01-21 VITALS — BP 140/70 | HR 64 | Ht 68.0 in | Wt 130.0 lb

## 2023-01-21 DIAGNOSIS — I10 Essential (primary) hypertension: Secondary | ICD-10-CM | POA: Diagnosis not present

## 2023-01-21 DIAGNOSIS — I471 Supraventricular tachycardia, unspecified: Secondary | ICD-10-CM

## 2023-01-21 DIAGNOSIS — Z720 Tobacco use: Secondary | ICD-10-CM | POA: Diagnosis not present

## 2023-01-21 DIAGNOSIS — E222 Syndrome of inappropriate secretion of antidiuretic hormone: Secondary | ICD-10-CM | POA: Diagnosis not present

## 2023-01-21 NOTE — Patient Instructions (Signed)
Medication Instructions:  NO CHANGES *If you need a refill on your cardiac medications before your next appointment, please call your pharmacy*   Lab Work: NO LABS If you have labs (blood work) drawn today and your tests are completely normal, you will receive your results only by: MyChart Message (if you have MyChart) OR A paper copy in the mail If you have any lab test that is abnormal or we need to change your treatment, we will call you to review the results.   Testing/Procedures: NO TESTING   Follow-Up: At [facility], you and your health needs are our priority.  As part of our continuing mission to provide you with exceptional heart care, we have created designated Provider Care Teams.  These Care Teams include your primary Cardiologist (physician) and Advanced Practice Providers (APPs -  Physician Assistants and Nurse Practitioners) who all work together to provide you with the care you need, when you need it.  Your next appointment:   6 month(s)  Provider:   Thurmon Fair, MD

## 2023-02-04 ENCOUNTER — Encounter: Payer: Self-pay | Admitting: Emergency Medicine

## 2023-02-21 ENCOUNTER — Ambulatory Visit: Payer: Medicare HMO

## 2023-02-28 ENCOUNTER — Ambulatory Visit: Payer: Medicare HMO | Admitting: Family Medicine

## 2023-06-26 ENCOUNTER — Ambulatory Visit: Payer: Medicare Other | Admitting: Family Medicine

## 2023-11-13 ENCOUNTER — Ambulatory Visit: Payer: Medicare Other | Admitting: Family Medicine

## 2024-01-18 ENCOUNTER — Emergency Department (HOSPITAL_COMMUNITY)

## 2024-01-18 ENCOUNTER — Inpatient Hospital Stay (HOSPITAL_COMMUNITY)
Admission: EM | Admit: 2024-01-18 | Discharge: 2024-01-20 | DRG: 190 | Disposition: A | Discharge status: Skilled Nursing Facility | Source: Skilled Nursing Facility | Attending: Internal Medicine | Admitting: Internal Medicine

## 2024-01-18 ENCOUNTER — Other Ambulatory Visit: Payer: Self-pay

## 2024-01-18 ENCOUNTER — Encounter (HOSPITAL_COMMUNITY): Payer: Self-pay

## 2024-01-18 DIAGNOSIS — J441 Chronic obstructive pulmonary disease with (acute) exacerbation: Secondary | ICD-10-CM | POA: Diagnosis not present

## 2024-01-18 DIAGNOSIS — I5032 Chronic diastolic (congestive) heart failure: Secondary | ICD-10-CM | POA: Diagnosis present

## 2024-01-18 DIAGNOSIS — W19XXXA Unspecified fall, initial encounter: Secondary | ICD-10-CM | POA: Diagnosis present

## 2024-01-18 DIAGNOSIS — F17210 Nicotine dependence, cigarettes, uncomplicated: Secondary | ICD-10-CM | POA: Diagnosis present

## 2024-01-18 DIAGNOSIS — Z8249 Family history of ischemic heart disease and other diseases of the circulatory system: Secondary | ICD-10-CM

## 2024-01-18 DIAGNOSIS — Z888 Allergy status to other drugs, medicaments and biological substances status: Secondary | ICD-10-CM

## 2024-01-18 DIAGNOSIS — M940 Chondrocostal junction syndrome [Tietze]: Secondary | ICD-10-CM | POA: Diagnosis not present

## 2024-01-18 DIAGNOSIS — Z79899 Other long term (current) drug therapy: Secondary | ICD-10-CM

## 2024-01-18 DIAGNOSIS — Z720 Tobacco use: Secondary | ICD-10-CM | POA: Diagnosis present

## 2024-01-18 DIAGNOSIS — Z1152 Encounter for screening for COVID-19: Secondary | ICD-10-CM

## 2024-01-18 DIAGNOSIS — J9601 Acute respiratory failure with hypoxia: Secondary | ICD-10-CM | POA: Diagnosis present

## 2024-01-18 DIAGNOSIS — R079 Chest pain, unspecified: Secondary | ICD-10-CM | POA: Diagnosis not present

## 2024-01-18 DIAGNOSIS — R0902 Hypoxemia: Secondary | ICD-10-CM

## 2024-01-18 DIAGNOSIS — Z7982 Long term (current) use of aspirin: Secondary | ICD-10-CM

## 2024-01-18 DIAGNOSIS — I10 Essential (primary) hypertension: Secondary | ICD-10-CM | POA: Diagnosis present

## 2024-01-18 DIAGNOSIS — E785 Hyperlipidemia, unspecified: Secondary | ICD-10-CM | POA: Diagnosis present

## 2024-01-18 DIAGNOSIS — S22040A Wedge compression fracture of fourth thoracic vertebra, initial encounter for closed fracture: Secondary | ICD-10-CM | POA: Diagnosis not present

## 2024-01-18 DIAGNOSIS — J9621 Acute and chronic respiratory failure with hypoxia: Secondary | ICD-10-CM | POA: Diagnosis present

## 2024-01-18 DIAGNOSIS — I110 Hypertensive heart disease with heart failure: Secondary | ICD-10-CM | POA: Diagnosis present

## 2024-01-18 DIAGNOSIS — S22049A Unspecified fracture of fourth thoracic vertebra, initial encounter for closed fracture: Secondary | ICD-10-CM | POA: Diagnosis present

## 2024-01-18 DIAGNOSIS — Z90710 Acquired absence of both cervix and uterus: Secondary | ICD-10-CM

## 2024-01-18 LAB — CBC
HCT: 41.2 % (ref 36.0–46.0)
Hemoglobin: 13.3 g/dL (ref 12.0–15.0)
MCH: 32.4 pg (ref 26.0–34.0)
MCHC: 32.3 g/dL (ref 30.0–36.0)
MCV: 100.2 fL — ABNORMAL HIGH (ref 80.0–100.0)
Platelets: 191 K/uL (ref 150–400)
RBC: 4.11 MIL/uL (ref 3.87–5.11)
RDW: 12.1 % (ref 11.5–15.5)
WBC: 7 K/uL (ref 4.0–10.5)
nRBC: 0 % (ref 0.0–0.2)

## 2024-01-18 LAB — BASIC METABOLIC PANEL WITH GFR
Anion gap: 11 (ref 5–15)
BUN: 16 mg/dL (ref 8–23)
CO2: 30 mmol/L (ref 22–32)
Calcium: 8.9 mg/dL (ref 8.9–10.3)
Chloride: 98 mmol/L (ref 98–111)
Creatinine, Ser: 0.87 mg/dL (ref 0.44–1.00)
GFR, Estimated: >60 mL/min (ref >60)
Glucose, Bld: 106 mg/dL — ABNORMAL HIGH (ref 70–99)
Potassium: 4.3 mmol/L (ref 3.5–5.1)
Sodium: 139 mmol/L (ref 135–145)

## 2024-01-18 LAB — RESP PANEL BY RT-PCR (RSV, FLU A&B, COVID)  RVPGX2
Influenza A by PCR: NEGATIVE
Influenza B by PCR: NEGATIVE
Resp Syncytial Virus by PCR: NEGATIVE
SARS Coronavirus 2 by RT PCR: NEGATIVE

## 2024-01-18 LAB — BRAIN NATRIURETIC PEPTIDE: B Natriuretic Peptide: 212.1 pg/mL — ABNORMAL HIGH (ref 0.0–100.0)

## 2024-01-18 LAB — TROPONIN I (HIGH SENSITIVITY)
Troponin I (High Sensitivity): 8 ng/L (ref <18)
Troponin I (High Sensitivity): 7 ng/L (ref <18)

## 2024-01-18 LAB — I-STAT CHEM 8, ED
BUN: 17 mg/dL (ref 8–23)
Calcium, Ion: 1.19 mmol/L (ref 1.15–1.40)
Chloride: 98 mmol/L (ref 98–111)
Creatinine, Ser: 0.9 mg/dL (ref 0.44–1.00)
Glucose, Bld: 108 mg/dL — ABNORMAL HIGH (ref 70–99)
HCT: 41 % (ref 36.0–46.0)
Hemoglobin: 13.9 g/dL (ref 12.0–15.0)
Potassium: 3.9 mmol/L (ref 3.5–5.1)
Sodium: 141 mmol/L (ref 135–145)
TCO2: 31 mmol/L (ref 22–32)

## 2024-01-18 MED ORDER — ACETAMINOPHEN 325 MG PO TABS
650.0000 mg | ORAL_TABLET | Freq: Once | ORAL | Status: AC
  Administered 2024-01-18: 650 mg via ORAL
  Filled 2024-01-18: qty 2

## 2024-01-18 MED ORDER — FENTANYL CITRATE PF 50 MCG/ML IJ SOSY
25.0000 ug | PREFILLED_SYRINGE | INTRAMUSCULAR | Status: DC | PRN
  Administered 2024-01-19: 25 ug via INTRAVENOUS
  Administered 2024-01-19: 50 ug via INTRAVENOUS
  Filled 2024-01-18 (×2): qty 1

## 2024-01-18 MED ORDER — IPRATROPIUM-ALBUTEROL 0.5-2.5 (3) MG/3ML IN SOLN
3.0000 mL | Freq: Once | RESPIRATORY_TRACT | Status: AC
  Administered 2024-01-18: 3 mL via RESPIRATORY_TRACT
  Filled 2024-01-18: qty 3

## 2024-01-18 MED ORDER — KETOROLAC TROMETHAMINE 30 MG/ML IJ SOLN
30.0000 mg | Freq: Once | INTRAMUSCULAR | Status: AC
  Administered 2024-01-18: 30 mg via INTRAMUSCULAR
  Filled 2024-01-18: qty 1

## 2024-01-18 MED ORDER — MORPHINE SULFATE (PF) 4 MG/ML IV SOLN
4.0000 mg | Freq: Once | INTRAVENOUS | Status: AC
  Administered 2024-01-18: 4 mg via INTRAVENOUS
  Filled 2024-01-18: qty 1

## 2024-01-18 MED ORDER — IOHEXOL 350 MG/ML SOLN
75.0000 mL | Freq: Once | INTRAVENOUS | Status: AC | PRN
  Administered 2024-01-18: 75 mL via INTRAVENOUS

## 2024-01-18 MED ORDER — ASPIRIN 81 MG PO CHEW
324.0000 mg | CHEWABLE_TABLET | Freq: Once | ORAL | Status: AC
  Administered 2024-01-18: 324 mg via ORAL
  Filled 2024-01-18: qty 4

## 2024-01-18 NOTE — ED Provider Notes (Signed)
 3:13 PM Patient signed out to me by previous ED physician. Pt is a 73yo female presenting for chest pain.    ECG and trops stable CTPE negative for pe.  Got duonebs but now 84% on room air. Doesn't normally wear oxygen.    Physical Exam  BP 116/67 (BP Location: Right Arm)   Pulse 65   Temp 97.7 F (36.5 C) (Oral)   Resp 16   Ht 5' 8.5 (1.74 m)   Wt 77.1 kg   SpO2 (!) 88%   BMI 25.47 kg/m   Physical Exam  Procedures  .Critical Care  Performed by: Elnor Bernarda SQUIBB, DO Authorized by: Elnor Bernarda SQUIBB, DO   Critical care provider statement:    Critical care time (minutes):  101   Critical care was necessary to treat or prevent imminent or life-threatening deterioration of the following conditions: hypoxia.   Critical care was time spent personally by me on the following activities:  Development of treatment plan with patient or surrogate, discussions with consultants, evaluation of patient's response to treatment, examination of patient, ordering and review of laboratory studies, ordering and review of radiographic studies, ordering and performing treatments and interventions, pulse oximetry, re-evaluation of patient's condition and review of old charts   ED Course / MDM   Clinical Course as of 01/18/24 1513  Sat Jan 18, 2024  0956 Chest x-ray interpreted by me as no acute infiltrate.  Awaiting radiology reading. [MB]  1300 Reviewed results of workup with patient.  She is currently on oxygen so I am going to turn that off and see what her sat is doing.  If she is not hypoxic she would be able to return back to her facility [MB]    Clinical Course User Index [MB] Towana Ozell BROCKS, MD   Medical Decision Making Amount and/or Complexity of Data Reviewed Labs: ordered. Radiology: ordered.  Risk OTC drugs. Prescription drug management. Decision regarding hospitalization.   Patient recommended for admission for new hypoxia.  Patient was given DuoNeb treatments for hypoxia.   She is also splinting due to chest pain.  Her ECG and troponins are stable.  She has a stable CT chest.  COVID PCR was sent.  No history of coughing or fevers.  Toradol  and Tylenol  given for chest pain.  Patient still hypoxic ranging from 79 to 84% on room air.  She does not normally wear oxygen at home.  Will continue the 2 L nasal cannula.   Patient accepting by admitting service.    Elnor Bernarda SQUIBB, DO 01/18/24 1926

## 2024-01-18 NOTE — ED Provider Notes (Signed)
 [place] EMERGENCY DEPARTMENT AT [facility] Provider Note   CSN: [id number] Arrival date & time: 01/18/24  9091     Patient presents with: Chest Pain   Erica Ball is a 73 y.o. female.  She is brought in by ambulance from [facility] facility with complaint of sharp stabbing chest pain under her left breast that started around 2 or 3 AM today.  She rates it as 10 out of 10.  She has never had it before.  Associated with shortness of breath.  No known trauma.  Ambulates with a walker.  No history of cardiac disease.  Former smoker with history of COPD.  They gave her some Tylenol  at the facility which helped transiently.  She said she is at [facility] for a low-sodium, that has been improving   The history is provided by the patient.  Chest Pain Pain location:  L chest Pain quality: sharp and stabbing   Pain radiates to:  Does not radiate Pain severity:  Severe Onset quality:  Sudden Duration:  7 hours Timing:  Constant Progression:  Unchanged Chronicity:  New Context: at rest   Relieved by:  Nothing Worsened by:  Nothing Ineffective treatments:  None tried Associated symptoms: shortness of breath   Associated symptoms: no abdominal pain, no cough, no diaphoresis, no fever, no nausea and no vomiting        Prior to Admission medications   Medication Sig Start Date End Date Taking? Authorizing Provider  albuterol  (PROVENTIL  HFA;VENTOLIN  HFA) 108 (90 Base) MCG/ACT inhaler Inhale 1-2 puffs into the lungs every 4 (four) hours as needed for shortness of breath. 07/10/17   [provider]  aspirin  EC 81 MG tablet Take 1 tablet (81 mg total) by mouth daily. 12/06/17   Nishan, Peter C, MD  bisacodyl  (DULCOLAX) 10 MG suppository Place 10 mg rectally as needed for moderate constipation.    [provider]  cyanocobalamin  1000 MCG tablet Take 1 tablet (1,000 mcg total) by mouth daily. 11/28/22   Leotis Bogus, MD  feeding supplement (ENSURE ENLIVE / ENSURE  PLUS) LIQD Take 237 mLs by mouth 2 (two) times daily between meals. 12/17/22   Regalado, Belkys A, MD  folic acid  (FOLVITE ) 1 MG tablet Take 1 tablet (1 mg total) by mouth daily. 11/05/22   Patel, Poonamkumari J, MD  hydrALAZINE  (APRESOLINE ) 25 MG tablet Take 25 mg by mouth 3 (three) times daily.    [provider]  magnesium  oxide (MAG-OX) 400 (240 Mg) MG tablet Take 1 tablet (400 mg total) by mouth daily. 11/29/22   Leotis Bogus, MD  metoprolol  succinate (TOPROL  XL) 100 MG 24 hr tablet Take 1 tablet (100 mg total) by mouth daily. Take with or immediately following a meal. 11/04/22 11/04/23  Tobie Lola PARAS, MD  ondansetron  (ZOFRAN ) 4 MG tablet Take 1 tablet (4 mg total) by mouth every 6 (six) hours as needed for nausea. 12/17/22   Regalado, Belkys A, MD  pantoprazole  (PROTONIX ) 40 MG tablet Take 1 tablet (40 mg total) by mouth daily. 11/29/22   Leotis Bogus, MD  rosuvastatin  (CRESTOR ) 10 MG tablet Take 1 tablet (10 mg total) by mouth daily. 11/05/22   Patel, Poonamkumari J, MD  sodium chloride  1 g tablet Take 1 g by mouth 2 (two) times daily with a meal.    [provider]  thiamine  (VITAMIN B-1) 100 MG tablet Take 1 tablet (100 mg total) by mouth daily. 11/05/22   Patel, Poonamkumari J, MD  Allergies: Dextromethorphan    Review of Systems  Constitutional:  Negative for diaphoresis and fever.  HENT:  Negative for sore throat.   Respiratory:  Positive for shortness of breath. Negative for cough.   Cardiovascular:  Positive for chest pain.  Gastrointestinal:  Negative for abdominal pain, nausea and vomiting.  Genitourinary:  Negative for dysuria.  Skin:  Negative for rash.    Updated Vital Signs BP 137/77   Pulse 72   Temp 97.9 F (36.6 C) (Oral)   Resp (!) 24   Ht 5' 8.5 (1.74 m)   Wt 77.1 kg   SpO2 99%   BMI 25.47 kg/m   Physical Exam Vitals and nursing note reviewed.  Constitutional:      General: She is not in acute distress.    Appearance: Normal  appearance. She is well-developed.  HENT:     Head: Normocephalic and atraumatic.  Eyes:     Conjunctiva/sclera: Conjunctivae normal.  Cardiovascular:     Rate and Rhythm: Normal rate and regular rhythm.     Heart sounds: Normal heart sounds. No murmur heard. Pulmonary:     Effort: Pulmonary effort is normal. No respiratory distress.     Breath sounds: Normal breath sounds. No stridor. No wheezing.  Abdominal:     Palpations: Abdomen is soft.     Tenderness: There is no abdominal tenderness. There is no guarding or rebound.  Musculoskeletal:        General: No tenderness or deformity. Normal range of motion.     Cervical back: Neck supple.     Right lower leg: No tenderness. No edema.     Left lower leg: No tenderness. Edema present.  Skin:    General: Skin is warm and dry.  Neurological:     General: No focal deficit present.     Mental Status: She is alert.     GCS: GCS eye subscore is 4. GCS verbal subscore is 5. GCS motor subscore is 6.     (all labs ordered are listed, but only abnormal results are displayed) Labs Reviewed  BASIC METABOLIC PANEL WITH GFR - Abnormal; Notable for the following components:      Result Value   Glucose, Bld 106 (*)    All other components within normal limits  CBC - Abnormal; Notable for the following components:   MCV 100.2 (*)    All other components within normal limits  BRAIN NATRIURETIC PEPTIDE - Abnormal; Notable for the following components:   B Natriuretic Peptide 212.1 (*)    All other components within normal limits  I-STAT CHEM 8, ED - Abnormal; Notable for the following components:   Glucose, Bld 108 (*)    All other components within normal limits  RESP PANEL BY RT-PCR (RSV, FLU A&B, COVID)  [id number]  TROPONIN I (HIGH SENSITIVITY)  TROPONIN I (HIGH SENSITIVITY)    EKG: EKG Interpretation Date/Time:  Saturday January 18 2024 09:15:12 EDT Ventricular Rate:  71 PR Interval:  178 QRS Duration:  80 QT Interval:  400 QTC  Calculation: 435 R Axis:   65  Text Interpretation: Sinus rhythm No significant change since prior 8/24 Confirmed by Towana Sharper [phone]) on 01/18/2024 9:16:20 AM  Radiology: CT Angio Chest PE W/Cm &/Or Wo Cm Result Date: 01/18/2024 CLINICAL DATA:  Pleuritic left chest pain starting overnight. Mild hypoxia with increasing shortness of breath EXAM: CT ANGIOGRAPHY CHEST WITH CONTRAST TECHNIQUE: Multidetector CT imaging of the chest was performed using the standard protocol during bolus administration  of intravenous contrast. Multiplanar CT image reconstructions and MIPs were obtained to evaluate the vascular anatomy. RADIATION DOSE REDUCTION: This exam was performed according to the departmental dose-optimization program which includes automated exposure control, adjustment of the mA and/or kV according to patient size and/or use of iterative reconstruction technique. CONTRAST:  75mL OMNIPAQUE  IOHEXOL  350 MG/ML SOLN COMPARISON:  Radiograph 01/18/2024 and CT chest 09/17/2017 FINDINGS: Cardiovascular: No filling defect is identified in the pulmonary arterial tree to suggest pulmonary embolus. Coronary, aortic arch, and branch vessel atherosclerotic vascular disease. Mild cardiomegaly particularly affecting the right ventricle. Mild aortic and mitral valve calcification. Mediastinum/Nodes: Mildly prominent right hilar lymph node 1.1 cm in short axis diameter. Upper normal sized paratracheal and AP window lymph nodes. Lungs/Pleura: Narrowing of the lower lobe bronchi may reflect mild bronchomalacia. Airway thickening with mosaic attenuation pattern in the lungs potentially from air trapping. Bandlike atelectasis in the right middle lobe and both lower lobes. Upper Abdomen: Unremarkable where included. Musculoskeletal: Old healed bilateral rib fractures. Old healed sternal fracture. Chronic anterior wedge compression fracture at T3. Subacute superior endplate compression fracture at T4 with faint sclerosis along the  superior endplate and 20% loss of vertebral height. Review of the MIP images confirms the above findings. IMPRESSION: 1. No filling defect is identified in the pulmonary arterial tree to suggest pulmonary embolus. 2. Airway thickening with mosaic attenuation pattern in the lungs potentially from air trapping. 3. Mildly prominent right hilar lymph node at 1.1 cm in short axis diameter, nonspecific. 4. Subacute superior endplate compression fracture at T4 with 20% loss of vertebral height. 5. Old healed bilateral rib fractures and old healed sternal fracture. 6. Mild cardiomegaly particularly affecting the right ventricle. 7.  Aortic Atherosclerosis (ICD10-I70.0). Electronically Signed   By: Ryan Salvage M.D.   On: 01/18/2024 12:21   DG Chest Portable 1 View Result Date: 01/18/2024 EXAM: 1 VIEW XRAY OF THE CHEST 01/18/2024 09:44:00 AM COMPARISON: 12/11/2022 CLINICAL HISTORY: Chest pain, SOB. Per ED triage notes: Pt from [facility] with ems c.o left sided chest pain underneath her breast that started overnight. Pain worse with a deep breath. ; Pt was 88% on room air, pt given 4L [place] en route. Pt c.o increased SOB over the past week, hx of COPD but not on any oxygen FINDINGS: LUNGS AND PLEURA: No focal pulmonary opacity. No pulmonary edema. No pleural effusion. No pneumothorax. Bilateral hilar prominence, likely prominent vasculature. HEART AND MEDIASTINUM: No acute abnormality of the cardiac and mediastinal silhouettes. Atherosclerotic plaque. BONES AND SOFT TISSUES: No acute osseous abnormality. IMPRESSION: 1. No acute findings. 2. Bilateral hilar prominence, likely prominent vasculature. Electronically signed by: Waddell Calk MD 01/18/2024 10:08 AM EDT RP Workstation: [id number]     Procedures   Medications Ordered in the ED  morphine  (PF) 4 MG/ML injection 4 mg (4 mg Intravenous Given 01/18/24 0939)  aspirin  chewable tablet 324 mg (324 mg Oral Given 01/18/24 0938)  iohexol  (OMNIPAQUE ) 350 MG/ML  injection 75 mL (75 mLs Intravenous Contrast Given 01/18/24 1134)  ipratropium-albuterol  (DUONEB) 0.5-2.5 (3) MG/3ML nebulizer solution 3 mL (3 mLs Nebulization Given 01/18/24 1502)  ketorolac  (TORADOL ) 30 MG/ML injection 30 mg (30 mg Intramuscular Given 01/18/24 1549)  acetaminophen  (TYLENOL ) tablet 650 mg (650 mg Oral Given 01/18/24 1548)    Clinical Course as of 01/18/24 1635  Sat Jan 18, 2024  0956 Chest x-ray interpreted by me as no acute infiltrate.  Awaiting radiology reading. [MB]  1300 Reviewed results of workup with patient.  She is currently on  oxygen so I am going to turn that off and see what her sat is doing.  If she is not hypoxic she would be able to return back to her facility [MB]    Clinical Course User Index [MB] Towana Ozell BROCKS, MD                                 Medical Decision Making Amount and/or Complexity of Data Reviewed Labs: ordered. Radiology: ordered.  Risk OTC drugs. Prescription drug management.   This patient complains of chest pain under her left breast; this involves an extensive number of treatment Options and is a complaint that carries with it a high risk of complications and morbidity. The differential includes ACS, pneumonia, pneumothorax, PE, vascular,  I ordered, reviewed and interpreted labs, which included CBC with normal white count, chemistries unremarkable, BNP mildly elevated, troponins flat I ordered medication pain medication aspirin  breathing treatment and reviewed PMP when indicated. I ordered imaging studies which included chest x-ray and CT angio chest and I independently    visualized and interpreted imaging which showed no evidence of PE. Previous records obtained and reviewed in epic no recent admissions Cardiac monitoring reviewed, sinus rhythm Social determinants considered, medical literacy tobacco use social isolation Critical Interventions: None  After the interventions stated above, I reevaluated the patient and  found patient to be feeling better pain wise but now has possibly new oxygen requirement Admission and further testing considered, her care is signed out to Dr. Elnor.  Initiating some breathing treatments and checking COVID.  If she is not requiring oxygen she likely can return back to her facility.  Otherwise may require admission.      Final diagnoses:  Nonspecific chest pain  Closed wedge compression fracture of T4 vertebra, initial encounter [facility])    ED Discharge Orders     None          Towana Ozell BROCKS, MD 01/18/24 [phone]

## 2024-01-18 NOTE — ED Notes (Signed)
 After breathing treatment, patient was 85-86% RA. This RN palced 2L O2.

## 2024-01-18 NOTE — ED Notes (Signed)
 This RN accepted care of patient. Upon assessment patient was on 84% RA. This RN immediately applied 2L with no change in oxygen status. Applied nebulizer and patients oxygen went up to 98% on neb. RT called. MD notified.

## 2024-01-18 NOTE — ED Notes (Signed)
 CCMD called.

## 2024-01-18 NOTE — Subjective & Objective (Signed)
 Presented with CP that was reproducible with palpation started overnight Worse with deep breathing  Pt work up in ER unremarkable Pt here with increased SOB for 1 wk CTA neg, trop neg  Pt received IV opioids for pain  She is not on o2 at baseline  Pt has multiple visits to ER

## 2024-01-18 NOTE — Assessment & Plan Note (Signed)
 Allow permissive hypertension

## 2024-01-18 NOTE — ED Triage Notes (Signed)
 Pt from [facility] with ems c.o left sided chest pain underneath her breast that started overnight. Pain worse with a deep breath.  Pt was 88% on room air, pt given 4L [employer] en route. Pt c.o increased SOB over the past week, hx of COPD but not on any oxygen

## 2024-01-18 NOTE — Assessment & Plan Note (Signed)
 Appears to be subacute it is possible her chest pain is somewhat related pain management If no improvement may need intervention

## 2024-01-18 NOTE — Discharge Instructions (Addendum)
 You are seen in the emergency department for an episode of chest pain.  You had blood work EKG troponins and your chest that did not show an obvious explanation for your symptoms.  You did have a subacute compression fracture of T4.  Please continue your regular medications and follow-up with your primary care doctor.  Return to the emergency department if any worsening or concerning symptoms

## 2024-01-18 NOTE — Assessment & Plan Note (Signed)
 Mild COPD exacerbation Continue inhaled steroids as needed albuterol  oxygen as needed

## 2024-01-18 NOTE — ED Notes (Addendum)
 Charted in error.

## 2024-01-18 NOTE — ED Notes (Signed)
 Called regarding BNP, lab staff stated it should result shortly

## 2024-01-18 NOTE — H&P (Addendum)
 Erica Ball FMW:[id number] DOB: [birth year] DOA: 01/18/2024     PCP: Patient, No Pcp Per   Outpatient Specialists:   CARDS:  Dr. Jerel Balding, MD     Patient arrived to ER on 01/18/24 at 0908 Referred by Attending Elnor Bernarda SQUIBB, DO   Patient coming from:     From facility  [facility]    Chief Complaint:   Chief Complaint  Patient presents with   Chest Pain    HPI: Erica Ball is a 73 y.o. female with medical history significant of COPD, history of tobacco abuse, SVT hypertension hyperlipidemia diastolic CHF    Presented with   chest pain  Presented with CP that was reproducible with palpation started overnight Worse with deep breathing  Pt work up in ER unremarkable Pt here with increased SOB for 1 wk CTA neg, trop neg  Pt received IV opioids for pain  She is not on o2 at baseline  Pt has multiple visits to ER Reports has not smoked since May chest pain reproducible by palpation  Denies significant ETOH intake   Does not smoke      Regarding pertinent Chronic problems:    Hyperlipidemia - on statins Crestor  Lipid Panel     Component Value Date/Time   CHOL 118 10/30/2022 0058   TRIG 46 10/30/2022 0058   HDL 47 10/30/2022 0058   CHOLHDL 2.5 10/30/2022 0058   VLDL 9 10/30/2022 0058   LDLCALC 62 10/30/2022 0058    ,   chronic CHF diastolic  - last echo  Recent Results (from the past 56199 hours)  ECHOCARDIOGRAM COMPLETE   Collection Time: 10/30/22  3:15 PM  Result Value   Weight 2,240   Height 68   BP 150/84   S' Lateral 2.90   P 1/2 time 677   Area-P 1/2 3.77   Est EF 60 - 65%   Narrative      ECHOCARDIOGRAM REPORT      IMPRESSIONS    1. Left ventricular ejection fraction, by estimation, is 60 to 65%. The left ventricle has normal function. The left ventricle has no regional wall motion abnormalities. Left ventricular diastolic parameters are consistent with Grade I diastolic  dysfunction (impaired relaxation).  2. Right  ventricular systolic function is normal. The right ventricular size is normal.  3. Left atrial size was mildly dilated.  4. The mitral valve is normal in structure. No evidence of mitral valve regurgitation. No evidence of mitral stenosis.  5. The aortic valve is normal in structure. Aortic valve regurgitation is mild to moderate. No aortic stenosis is present.  6. The inferior vena cava is normal in size with greater than 50% respiratory variability, suggesting right atrial pressure of 3 mmHg.             COPD - not  followed by pulmonology   not  on baseline oxygen      While in ER: Clinical Course as of 01/18/24 1928  Sat Jan 18, 2024  9043 Chest x-ray interpreted by me as no acute infiltrate.  Awaiting radiology reading. [MB]  1300 Reviewed results of workup with patient.  She is currently on oxygen so I am going to turn that off and see what her sat is doing.  If she is not hypoxic she would be able to return back to her facility [MB]    Clinical Course User Index [MB] Towana Ozell BROCKS, MD         Lab  Orders         Resp panel by RT-PCR (RSV, Flu A&B, Covid) Anterior Nasal Swab         Basic metabolic panel         CBC         Brain natriuretic peptide         I-stat chem 8, ED (not at [facility], [facility] or [facility])        CXR -  NON acute   CTA chest - no PE,. Airway thickening with mosaic attenuation pattern in the lungs potentially from air trapping.    Subacute superior endplate compression fracture at T4 with 20% loss of vertebral height.   Following Medications were ordered in ER: Medications  morphine  (PF) 4 MG/ML injection 4 mg (4 mg Intravenous Given 01/18/24 0939)  aspirin  chewable tablet 324 mg (324 mg Oral Given 01/18/24 0938)  iohexol  (OMNIPAQUE ) 350 MG/ML injection 75 mL (75 mLs Intravenous Contrast Given 01/18/24 1134)  ipratropium-albuterol  (DUONEB) 0.5-2.5 (3) MG/3ML nebulizer solution 3 mL (3 mLs Nebulization Given 01/18/24 1502)  ketorolac  (TORADOL ) 30  MG/ML injection 30 mg (30 mg Intramuscular Given 01/18/24 1549)  acetaminophen  (TYLENOL ) tablet 650 mg (650 mg Oral Given 01/18/24 1548)       ED Triage Vitals  Encounter Vitals Group     BP 01/18/24 0914 137/77     Girls Systolic BP Percentile --      Girls Diastolic BP Percentile --      Boys Systolic BP Percentile --      Boys Diastolic BP Percentile --      Pulse Rate 01/18/24 0912 72     Resp 01/18/24 0914 (!) 24     Temp 01/18/24 0912 97.9 F (36.6 C)     Temp Source 01/18/24 0912 Oral     SpO2 01/18/24 0910 98 %     Weight 01/18/24 0911 170 lb (77.1 kg)     Height 01/18/24 0911 5' 8.5 (1.74 m)     Head Circumference --      Peak Flow --      Pain Score 01/18/24 0910 9     Pain Loc --      Pain Education --      Exclude from Growth Chart --   UFJK(75)@     _________________________________________ Significant initial  Findings: Abnormal Labs Reviewed  BASIC METABOLIC PANEL WITH GFR - Abnormal; Notable for the following components:      Result Value   Glucose, Bld 106 (*)    All other components within normal limits  CBC - Abnormal; Notable for the following components:   MCV 100.2 (*)    All other components within normal limits  BRAIN NATRIURETIC PEPTIDE - Abnormal; Notable for the following components:   B Natriuretic Peptide 212.1 (*)    All other components within normal limits  I-STAT CHEM 8, ED - Abnormal; Notable for the following components:   Glucose, Bld 108 (*)    All other components within normal limits      _________________________ Troponin  ordered Cardiac Panel (last 3 results) Recent Labs    01/18/24 0934 01/18/24 1053  TROPONINIHS 8 7     ECG: Ordered Personally reviewed and interpreted by me showing: HR : 71 Rhythm: Sinus rhythm QTC 435  BNP (last 3 results) Recent Labs    01/18/24 0934  BNP 212.1*    The recent clinical data is shown below. Vitals:   01/18/24 1643 01/18/24 1800 01/18/24 1841 01/18/24  1845  BP:  92/68  (!)  141/79  Pulse:  68  69  Resp:  15  15  Temp:   (!) 97.5 F (36.4 C)   TempSrc:   Oral   SpO2: (!) 84% 99%    Weight:      Height:        WBC     Component Value Date/Time   WBC 7.0 01/18/2024 0934   LYMPHSABS 1.3 12/11/2022 2219   MONOABS 0.6 12/11/2022 2219   EOSABS 0.3 12/11/2022 2219   BASOSABS 0.1 12/11/2022 2219       Results for orders placed or performed during the hospital encounter of 01/18/24  Resp panel by RT-PCR (RSV, Flu A&B, Covid) Anterior Nasal Swab     Status: None   Collection Time: 01/18/24  3:53 PM   Specimen: Anterior Nasal Swab  Result Value Ref Range Status   SARS Coronavirus 2 by RT PCR NEGATIVE NEGATIVE Final   Influenza A by PCR NEGATIVE NEGATIVE Final   Influenza B by PCR NEGATIVE NEGATIVE Final         Resp Syncytial Virus by PCR NEGATIVE NEGATIVE Final              __________________________________________________________ Recent Labs  Lab 01/18/24 0934 01/18/24 0939  NA 139 141  K 4.3 3.9  CO2 30  --   GLUCOSE 106* 108*  BUN 16 17  CREATININE 0.87 0.90  CALCIUM  8.9  --     Cr   stable,   Lab Results  Component Value Date   CREATININE 0.90 01/18/2024   CREATININE 0.87 01/18/2024   CREATININE 0.75 12/18/2022    Lab Results  Component Value Date   CALCIUM  8.9 01/18/2024   PHOS 4.1 12/12/2022        Plt: Lab Results  Component Value Date   PLT 191 01/18/2024         Recent Labs  Lab 01/18/24 0934 01/18/24 0939  WBC 7.0  --   HGB 13.3 13.9  HCT 41.2 41.0  MCV 100.2*  --   PLT 191  --     HG/HCT  stable,       Component Value Date/Time   HGB 13.9 01/18/2024 0939   HCT 41.0 01/18/2024 0939   MCV 100.2 (H) 01/18/2024 0934   MCV 105.7 (A) 07/01/2012 1623    _______________________________________________ Hospitalist was called for admission for musculoskeletal Closed wedge compression fracture of T4 vertebra, initial encounter acute Pittore failure with hypoxia   The following Work up has been ordered so  far:  Orders Placed This Encounter  Procedures   Critical Care   Resp panel by RT-PCR (RSV, Flu A&B, Covid) Anterior Nasal Swab   DG Chest Portable 1 View   CT Angio Chest PE W/Cm &/Or Wo Cm   Basic metabolic panel   CBC   Brain natriuretic peptide   Document Height and Actual Weight   ED Cardiac monitoring   Consult for Unassigned Medical Admission   I-stat chem 8, ED (not at [facility], [facility] or [facility])   ED EKG   EKG 12-Lead     OTHER Significant initial  Findings:  labs showing:     DM  labs:  HbA1C: No results for input(s): HGBA1C in the last 8760 hours.     CBG (last 3)  No results for input(s): GLUCAP in the last 72 hours.        Cultures:    Component Value Date/Time   SDES URINE,  CLEAN CATCH 11/16/2022 2004   SPECREQUEST  11/16/2022 2004    NONE Performed at [facility], [address] [place], [place] [address]    CULT MULTIPLE SPECIES PRESENT, SUGGEST RECOLLECTION (A) 11/16/2022 2004   REPTSTATUS 11/18/2022 FINAL 11/16/2022 2004     Radiological Exams on Admission: CT Angio Chest PE W/Cm &/Or Wo Cm Result Date: 01/18/2024 CLINICAL DATA:  Pleuritic left chest pain starting overnight. Mild hypoxia with increasing shortness of breath EXAM: CT ANGIOGRAPHY CHEST WITH CONTRAST TECHNIQUE: Multidetector CT imaging of the chest was performed using the standard protocol during bolus administration of intravenous contrast. Multiplanar CT image reconstructions and MIPs were obtained to evaluate the vascular anatomy. RADIATION DOSE REDUCTION: This exam was performed according to the departmental dose-optimization program which includes automated exposure control, adjustment of the mA and/or kV according to patient size and/or use of iterative reconstruction technique. CONTRAST:  75mL OMNIPAQUE  IOHEXOL  350 MG/ML SOLN COMPARISON:  Radiograph 01/18/2024 and CT chest 09/17/2017 FINDINGS: Cardiovascular: No filling defect is identified in the pulmonary arterial tree to suggest  pulmonary embolus. Coronary, aortic arch, and branch vessel atherosclerotic vascular disease. Mild cardiomegaly particularly affecting the right ventricle. Mild aortic and mitral valve calcification. Mediastinum/Nodes: Mildly prominent right hilar lymph node 1.1 cm in short axis diameter. Upper normal sized paratracheal and AP window lymph nodes. Lungs/Pleura: Narrowing of the lower lobe bronchi may reflect mild bronchomalacia. Airway thickening with mosaic attenuation pattern in the lungs potentially from air trapping. Bandlike atelectasis in the right middle lobe and both lower lobes. Upper Abdomen: Unremarkable where included. Musculoskeletal: Old healed bilateral rib fractures. Old healed sternal fracture. Chronic anterior wedge compression fracture at T3. Subacute superior endplate compression fracture at T4 with faint sclerosis along the superior endplate and 20% loss of vertebral height. Review of the MIP images confirms the above findings. IMPRESSION: 1. No filling defect is identified in the pulmonary arterial tree to suggest pulmonary embolus. 2. Airway thickening with mosaic attenuation pattern in the lungs potentially from air trapping. 3. Mildly prominent right hilar lymph node at 1.1 cm in short axis diameter, nonspecific. 4. Subacute superior endplate compression fracture at T4 with 20% loss of vertebral height. 5. Old healed bilateral rib fractures and old healed sternal fracture. 6. Mild cardiomegaly particularly affecting the right ventricle. 7.  Aortic Atherosclerosis (ICD10-I70.0). Electronically Signed   By: Ryan Salvage M.D.   On: 01/18/2024 12:21   DG Chest Portable 1 View Result Date: 01/18/2024 EXAM: 1 VIEW XRAY OF THE CHEST 01/18/2024 09:44:00 AM COMPARISON: 12/11/2022 CLINICAL HISTORY: Chest pain, SOB. Per ED triage notes: Pt from [facility] with ems c.o left sided chest pain underneath her breast that started overnight. Pain worse with a deep breath. ; Pt was 88% on room air, pt  given 4L [place] en route. Pt c.o increased SOB over the past week, hx of COPD but not on any oxygen FINDINGS: LUNGS AND PLEURA: No focal pulmonary opacity. No pulmonary edema. No pleural effusion. No pneumothorax. Bilateral hilar prominence, likely prominent vasculature. HEART AND MEDIASTINUM: No acute abnormality of the cardiac and mediastinal silhouettes. Atherosclerotic plaque. BONES AND SOFT TISSUES: No acute osseous abnormality. IMPRESSION: 1. No acute findings. 2. Bilateral hilar prominence, likely prominent vasculature. Electronically signed by: Waddell Calk MD 01/18/2024 10:08 AM EDT RP Workstation: [id number]   _______________________________________________________________________________________________________ Latest  Blood pressure (!) 141/79, pulse 69, temperature (!) 97.5 F (36.4 C), temperature source Oral, resp. rate 15, height 5' 8.5 (1.74 m), weight 77.1 kg, SpO2 99%.  Vitals  labs and radiology finding personally reviewed  Review of Systems:    Pertinent positives include:  chest pain,  Constitutional:  No weight loss, night sweats, Fevers, chills, fatigue, weight loss  HEENT:  No headaches, Difficulty swallowing,Tooth/dental problems,Sore throat,  No sneezing, itching, ear ache, nasal congestion, post nasal drip,  Cardio-vascular:  No  Orthopnea, PND, anasarca, dizziness, palpitations.no Bilateral lower extremity swelling  GI:  No heartburn, indigestion, abdominal pain, nausea, vomiting, diarrhea, change in bowel habits, loss of appetite, melena, blood in stool, hematemesis Resp:  no shortness of breath at rest. No dyspnea on exertion, No excess mucus, no productive cough, No non-productive cough, No coughing up of blood.No change in color of mucus.No wheezing. Skin:  no rash or lesions. No jaundice GU:  no dysuria, change in color of urine, no urgency or frequency. No straining to urinate.  No flank pain.  Musculoskeletal:  No joint pain or no joint swelling. No  decreased range of motion. No back pain.  Psych:  No change in mood or affect. No depression or anxiety. No memory loss.  Neuro: no localizing neurological complaints, no tingling, no weakness, no double vision, no gait abnormality, no slurred speech, no confusion  All systems reviewed and apart from HOPI all are negative _______________________________________________________________________________________________ Past Medical History:   Past Medical History:  Diagnosis Date   Acute kidney injury (HCC) 07/10/2017   Acute respiratory failure with hypoxia (HCC) 07/10/2017   Alcohol abuse    Allergy    Cataract    Diverticulitis 10/30/2011   Elevated troponin 07/10/2017   Hypertension    Lobar pneumonia (HCC) 07/10/2017   Pleural effusion on right 07/10/2017   Sepsis (HCC) 07/10/2017   SVT (supraventricular tachycardia) (HCC)    Tobacco abuse    UTI (urinary tract infection) 07/10/2017   Vitamin D  deficiency 10/30/2011      Past Surgical History:  Procedure Laterality Date   ABDOMINAL HYSTERECTOMY     CATARACT EXTRACTION     FRACTURE SURGERY     IR THORACENTESIS ASP PLEURAL SPACE W/IMG GUIDE  07/11/2017   VIDEO ASSISTED THORACOSCOPY (VATS)/EMPYEMA Right 07/12/2017   Procedure: right VIDEO ASSISTED THORACOSCOPY (VATS) for drainage of EMPYEMA and decortication;  Surgeon: Kerrin Elspeth BROCKS, MD;  Location: [facility] OR;  Service: Thoracic;  Laterality: Right;   VIDEO BRONCHOSCOPY N/A 07/12/2017   Procedure: VIDEO BRONCHOSCOPY;  Surgeon: Kerrin Elspeth BROCKS, MD;  Location: [facility] OR;  Service: Thoracic;  Laterality: N/A;    Social History:  Ambulatory   cane, walker    reports that she has been smoking cigarettes. She has a 79.5 pack-year smoking history. She has never used smokeless tobacco. She reports current alcohol use of about 21.0 standard drinks of alcohol per week. She reports that she does not use drugs.    Family History:   Family History  Problem Relation Age of Onset    Hypertension Mother    Heart disease Mother    Hypertension Father    Heart disease Father    Stroke Brother    Hypertension Brother    Heart disease Brother    ______________________________________________________________________________________________ Allergies: Allergies  Allergen Reactions   Dextromethorphan Other (See Comments)    Makes her very angry and she wants to hurt people     Prior to Admission medications   Medication Sig Start Date End Date Taking? Authorizing Provider  albuterol  (PROVENTIL  HFA;VENTOLIN  HFA) 108 (90 Base) MCG/ACT inhaler Inhale 1-2 puffs into the lungs every 4 (four) hours as needed  for shortness of breath. 07/10/17   [provider]  aspirin  EC 81 MG tablet Take 1 tablet (81 mg total) by mouth daily. 12/06/17   Nishan, Peter C, MD  bisacodyl  (DULCOLAX) 10 MG suppository Place 10 mg rectally as needed for moderate constipation.    [provider]  cyanocobalamin  1000 MCG tablet Take 1 tablet (1,000 mcg total) by mouth daily. 11/28/22   Leotis Bogus, MD  feeding supplement (ENSURE ENLIVE / ENSURE PLUS) LIQD Take 237 mLs by mouth 2 (two) times daily between meals. 12/17/22   Regalado, Belkys A, MD  folic acid  (FOLVITE ) 1 MG tablet Take 1 tablet (1 mg total) by mouth daily. 11/05/22   Patel, Poonamkumari J, MD  hydrALAZINE  (APRESOLINE ) 25 MG tablet Take 25 mg by mouth 3 (three) times daily.    [provider]  magnesium  oxide (MAG-OX) 400 (240 Mg) MG tablet Take 1 tablet (400 mg total) by mouth daily. 11/29/22   Leotis Bogus, MD  metoprolol  succinate (TOPROL  XL) 100 MG 24 hr tablet Take 1 tablet (100 mg total) by mouth daily. Take with or immediately following a meal. 11/04/22 11/04/23  Tobie Lola PARAS, MD  ondansetron  (ZOFRAN ) 4 MG tablet Take 1 tablet (4 mg total) by mouth every 6 (six) hours as needed for nausea. 12/17/22   Regalado, Belkys A, MD  pantoprazole  (PROTONIX ) 40 MG tablet Take 1 tablet (40 mg total) by mouth daily.  11/29/22   Leotis Bogus, MD  rosuvastatin  (CRESTOR ) 10 MG tablet Take 1 tablet (10 mg total) by mouth daily. 11/05/22   Patel, Poonamkumari J, MD  sodium chloride  1 g tablet Take 1 g by mouth 2 (two) times daily with a meal.    [provider]  thiamine  (VITAMIN B-1) 100 MG tablet Take 1 tablet (100 mg total) by mouth daily. 11/05/22   Tobie Lola PARAS, MD    ___________________________________________________________________________________________________ Physical Exam:    01/18/2024    6:45 PM 01/18/2024    6:00 PM 01/18/2024    3:15 PM  Vitals with BMI  Systolic 141 92 141  Diastolic 79 68 70  Pulse 69 68 62     1. General:  in No  Acute distress   Chronically ill   -appearing 2. Psychological: Alert and   Oriented 3. Head/ENT:  Dry Mucous Membranes                          Head Non traumatic, neck supple                          Poor Dentition 4. SKIN: normal  Skin turgor,  Skin clean Dry and intact no rash    5. Heart: Regular rate and rhythm no  Murmur, no Rub or gallop 6. Lungs occasional wheezes no crackles chest pain reproducible palpation 7. Abdomen: Soft,  non-tender, Non distended   obese  bowel sounds present 8. Lower extremities: no clubbing, cyanosis, no  edema 9. Neurologically Grossly intact, moving all 4 extremities equally   10. MSK: Normal range of motion    Chart has been reviewed  ______________________________________________________________________________________________  Assessment/Plan 73 y.o. female with medical history significant of COPD, history of tobacco abuse, SVT hypertension hyperlipidemia diastolic CHF  Admitted for musculoskeletal Closed wedge compression fracture of T4 vertebra, initial encounter cute respiratory failure with hypoxia  Present on Admission:  Acute respiratory failure with hypoxia (HCC)  Hypertension  Tobacco abuse  COPD with acute exacerbation (HCC)  T4 vertebral fracture (HCC)      Hypertension Allow permissive hypertension  Tobacco abuse Now in remission  Acute respiratory failure with hypoxia (HCC) Could be in the setting of mild COPD exacerbation versus chest splinting secondary to chest pain.  Order incentive spirometry CT scan did show air trapping.  Supportive management pain management for chest pain. CTA negative for PE no evidence of pneumonia  COPD with acute exacerbation (HCC) Mild COPD exacerbation Continue inhaled steroids as needed albuterol  oxygen as needed  T4 vertebral fracture (HCC) Appears to be subacute it is possible her chest pain is somewhat related pain management If no improvement may need intervention   Other plan as per orders.  DVT prophylaxis:  SCD     Code Status:    Code Status: Prior FULL CODE  as per patient   I had personally discussed CODE STATUS with patient  ACP   none    Family Communication:   Family not at  Bedside    Diet heart healthy   Disposition Plan:                              Back to current facility when stable                              Following barriers for discharge:                             Chest pain work up is complete                                                         Pain controlled with PO medications                              Oxygen arranged for SNF                       Consults called:    NONE   Admission status:  ED Disposition     ED Disposition  Admit   Condition  --   Comment  Hospital Area: [facility] [[id number]]  Level of Care: Telemetry Medical [104]  Covid Evaluation: Asymptomatic - no recent exposure (last 10 days) testing not required  Diagnosis: Acute respiratory failure with hypoxia [facility]) [[id number]]  Admitting Physician: Nyheim Seufert [[id number]]  Attending Physician: Cici Rodriges [[id number]]  For patients discharging to extended facilities (i.e. SNF, AL, group homes or LTAC) initiate:: Discharge to SNF/Facility Placement  COVID-19 Lab Testing Protocol          Obs     Level of care     tele  For 12H   Kyrin Gratz 01/18/2024, 9:28 PM    [facility]     after 2 AM please page floor coverage   If 7AM-7PM, please contact the day team taking care of the patient using [email or web address]

## 2024-01-18 NOTE — ED Notes (Signed)
 Cancelled ptar

## 2024-01-18 NOTE — ED Notes (Signed)
 Patient noted to be dropping her O2 sat into mid 80s, placed onto 2 lpm.

## 2024-01-18 NOTE — Progress Notes (Signed)
 Called to ED40 to assess pt. Upon arrival, pt on neb w/ sats of 98%. BBS dec'd but essn clear. After neb complete, pt's sats dropped to 85-85% on room air. Sats maintained >90% on 2 lpm.

## 2024-01-18 NOTE — Assessment & Plan Note (Addendum)
 Could be in the setting of mild COPD exacerbation versus chest splinting secondary to chest pain.  Order incentive spirometry CT scan did show air trapping.  Supportive management pain management for chest pain. CTA negative for PE no evidence of pneumonia

## 2024-01-18 NOTE — ED Notes (Signed)
 Dr. Elnor informed of patient's continued low oxygen saturations of 79%-84% on room air. Pt placed back on 2L [place].

## 2024-01-18 NOTE — Assessment & Plan Note (Signed)
Now in remission. 

## 2024-01-19 DIAGNOSIS — J441 Chronic obstructive pulmonary disease with (acute) exacerbation: Secondary | ICD-10-CM | POA: Diagnosis present

## 2024-01-19 DIAGNOSIS — Z90710 Acquired absence of both cervix and uterus: Secondary | ICD-10-CM | POA: Diagnosis not present

## 2024-01-19 DIAGNOSIS — S22040A Wedge compression fracture of fourth thoracic vertebra, initial encounter for closed fracture: Secondary | ICD-10-CM | POA: Diagnosis present

## 2024-01-19 DIAGNOSIS — I5032 Chronic diastolic (congestive) heart failure: Secondary | ICD-10-CM | POA: Diagnosis present

## 2024-01-19 DIAGNOSIS — W19XXXA Unspecified fall, initial encounter: Secondary | ICD-10-CM | POA: Diagnosis present

## 2024-01-19 DIAGNOSIS — M940 Chondrocostal junction syndrome [Tietze]: Secondary | ICD-10-CM | POA: Diagnosis present

## 2024-01-19 DIAGNOSIS — J9621 Acute and chronic respiratory failure with hypoxia: Secondary | ICD-10-CM | POA: Diagnosis present

## 2024-01-19 DIAGNOSIS — Z888 Allergy status to other drugs, medicaments and biological substances status: Secondary | ICD-10-CM | POA: Diagnosis not present

## 2024-01-19 DIAGNOSIS — Z79899 Other long term (current) drug therapy: Secondary | ICD-10-CM | POA: Diagnosis not present

## 2024-01-19 DIAGNOSIS — I110 Hypertensive heart disease with heart failure: Secondary | ICD-10-CM | POA: Diagnosis present

## 2024-01-19 DIAGNOSIS — Z7982 Long term (current) use of aspirin: Secondary | ICD-10-CM | POA: Diagnosis not present

## 2024-01-19 DIAGNOSIS — J9601 Acute respiratory failure with hypoxia: Secondary | ICD-10-CM | POA: Diagnosis not present

## 2024-01-19 DIAGNOSIS — Z8249 Family history of ischemic heart disease and other diseases of the circulatory system: Secondary | ICD-10-CM | POA: Diagnosis not present

## 2024-01-19 DIAGNOSIS — E785 Hyperlipidemia, unspecified: Secondary | ICD-10-CM | POA: Diagnosis present

## 2024-01-19 DIAGNOSIS — Z1152 Encounter for screening for COVID-19: Secondary | ICD-10-CM | POA: Diagnosis not present

## 2024-01-19 DIAGNOSIS — F17210 Nicotine dependence, cigarettes, uncomplicated: Secondary | ICD-10-CM | POA: Diagnosis present

## 2024-01-19 LAB — CBC
HCT: 39.8 % (ref 36.0–46.0)
Hemoglobin: 12.6 g/dL (ref 12.0–15.0)
MCH: 31.8 pg (ref 26.0–34.0)
MCHC: 31.7 g/dL (ref 30.0–36.0)
MCV: 100.5 fL — ABNORMAL HIGH (ref 80.0–100.0)
Platelets: 214 K/uL (ref 150–400)
RBC: 3.96 MIL/uL (ref 3.87–5.11)
RDW: 12 % (ref 11.5–15.5)
WBC: 6.5 K/uL (ref 4.0–10.5)
nRBC: 0 % (ref 0.0–0.2)

## 2024-01-19 LAB — COMPREHENSIVE METABOLIC PANEL WITH GFR
ALT: 18 U/L (ref 0–44)
AST: 19 U/L (ref 15–41)
Albumin: 3.5 g/dL (ref 3.5–5.0)
Alkaline Phosphatase: 51 U/L (ref 38–126)
Anion gap: 11 (ref 5–15)
BUN: 14 mg/dL (ref 8–23)
CO2: 27 mmol/L (ref 22–32)
Calcium: 8.7 mg/dL — ABNORMAL LOW (ref 8.9–10.3)
Chloride: 96 mmol/L — ABNORMAL LOW (ref 98–111)
Creatinine, Ser: 0.9 mg/dL (ref 0.44–1.00)
GFR, Estimated: >60 mL/min (ref >60)
Glucose, Bld: 119 mg/dL — ABNORMAL HIGH (ref 70–99)
Potassium: 4 mmol/L (ref 3.5–5.1)
Sodium: 134 mmol/L — ABNORMAL LOW (ref 135–145)
Total Bilirubin: 0.8 mg/dL (ref 0.0–1.2)
Total Protein: 6.5 g/dL (ref 6.5–8.1)

## 2024-01-19 LAB — MAGNESIUM: Magnesium: 2 mg/dL (ref 1.7–2.4)

## 2024-01-19 LAB — PHOSPHORUS: Phosphorus: 4.6 mg/dL (ref 2.5–4.6)

## 2024-01-19 MED ORDER — PANTOPRAZOLE SODIUM 40 MG PO TBEC
40.0000 mg | DELAYED_RELEASE_TABLET | Freq: Every day | ORAL | Status: DC
  Administered 2024-01-19 – 2024-01-20 (×2): 40 mg via ORAL
  Filled 2024-01-19 (×2): qty 1

## 2024-01-19 MED ORDER — ROSUVASTATIN CALCIUM 5 MG PO TABS
10.0000 mg | ORAL_TABLET | Freq: Every day | ORAL | Status: DC
  Administered 2024-01-19 – 2024-01-20 (×2): 10 mg via ORAL
  Filled 2024-01-19 (×2): qty 2

## 2024-01-19 MED ORDER — BUDESONIDE 0.5 MG/2ML IN SUSP
2.0000 mg | Freq: Two times a day (BID) | RESPIRATORY_TRACT | Status: DC
  Administered 2024-01-19: 2 mg via RESPIRATORY_TRACT
  Filled 2024-01-19 (×2): qty 8

## 2024-01-19 MED ORDER — HYDRALAZINE HCL 25 MG PO TABS
25.0000 mg | ORAL_TABLET | Freq: Three times a day (TID) | ORAL | Status: DC
  Administered 2024-01-19 – 2024-01-20 (×3): 25 mg via ORAL
  Filled 2024-01-19 (×3): qty 1

## 2024-01-19 MED ORDER — THIAMINE MONONITRATE 100 MG PO TABS
100.0000 mg | ORAL_TABLET | Freq: Every day | ORAL | Status: DC
  Administered 2024-01-19 – 2024-01-20 (×2): 100 mg via ORAL
  Filled 2024-01-19 (×2): qty 1

## 2024-01-19 MED ORDER — SODIUM CHLORIDE 1 G PO TABS
1.0000 g | ORAL_TABLET | Freq: Two times a day (BID) | ORAL | Status: DC
  Administered 2024-01-19 – 2024-01-20 (×2): 1 g via ORAL
  Filled 2024-01-19 (×2): qty 1

## 2024-01-19 MED ORDER — SODIUM CHLORIDE 0.9 % IV SOLN: 250.0000 mL | INTRAVENOUS | Status: AC | PRN

## 2024-01-19 MED ORDER — FOLIC ACID 1 MG PO TABS
1.0000 mg | ORAL_TABLET | Freq: Every day | ORAL | Status: DC
Start: 2024-01-19 — End: 2024-01-20
  Administered 2024-01-19 – 2024-01-20 (×2): 1 mg via ORAL
  Filled 2024-01-19 (×2): qty 1

## 2024-01-19 MED ORDER — SODIUM CHLORIDE 0.9% FLUSH: 3.0000 mL | INTRAVENOUS | Status: DC | PRN

## 2024-01-19 MED ORDER — ASPIRIN 81 MG PO TBEC
81.0000 mg | DELAYED_RELEASE_TABLET | Freq: Every day | ORAL | Status: DC
  Administered 2024-01-19 – 2024-01-20 (×2): 81 mg via ORAL
  Filled 2024-01-19 (×2): qty 1

## 2024-01-19 MED ORDER — ONDANSETRON HCL 4 MG PO TABS: 4.0000 mg | ORAL_TABLET | Freq: Four times a day (QID) | ORAL | Status: DC | PRN

## 2024-01-19 MED ORDER — IPRATROPIUM-ALBUTEROL 0.5-2.5 (3) MG/3ML IN SOLN
3.0000 mL | Freq: Four times a day (QID) | RESPIRATORY_TRACT | Status: DC
  Filled 2024-01-19: qty 3

## 2024-01-19 MED ORDER — ACETAMINOPHEN 650 MG RE SUPP: 650.0000 mg | Freq: Four times a day (QID) | RECTAL | Status: DC | PRN

## 2024-01-19 MED ORDER — HYDROCODONE-ACETAMINOPHEN 5-325 MG PO TABS
1.0000 | ORAL_TABLET | ORAL | Status: DC | PRN
  Administered 2024-01-19 (×2): 2 via ORAL
  Filled 2024-01-19 (×2): qty 2

## 2024-01-19 MED ORDER — ENSURE ENLIVE PO LIQD
237.0000 mL | Freq: Two times a day (BID) | ORAL | Status: DC
  Administered 2024-01-19 – 2024-01-20 (×3): 237 mL via ORAL
  Filled 2024-01-19 (×4): qty 237

## 2024-01-19 MED ORDER — ALBUTEROL SULFATE (2.5 MG/3ML) 0.083% IN NEBU: 2.5000 mg | INHALATION_SOLUTION | RESPIRATORY_TRACT | Status: DC | PRN

## 2024-01-19 MED ORDER — MAGNESIUM OXIDE -MG SUPPLEMENT 400 (240 MG) MG PO TABS
400.0000 mg | ORAL_TABLET | Freq: Every day | ORAL | Status: DC
Start: 2024-01-19 — End: 2024-01-20
  Administered 2024-01-19 – 2024-01-20 (×2): 400 mg via ORAL
  Filled 2024-01-19 (×2): qty 1

## 2024-01-19 MED ORDER — ONDANSETRON HCL 4 MG/2ML IJ SOLN: 4.0000 mg | Freq: Four times a day (QID) | INTRAMUSCULAR | Status: DC | PRN

## 2024-01-19 MED ORDER — VITAMIN B-12 1000 MCG PO TABS
1000.0000 ug | ORAL_TABLET | Freq: Every day | ORAL | Status: DC
  Administered 2024-01-19 – 2024-01-20 (×2): 1000 ug via ORAL
  Filled 2024-01-19 (×2): qty 1

## 2024-01-19 MED ORDER — SODIUM CHLORIDE 0.9% FLUSH
3.0000 mL | Freq: Two times a day (BID) | INTRAVENOUS | Status: DC
  Administered 2024-01-19 – 2024-01-20 (×3): 3 mL via INTRAVENOUS

## 2024-01-19 MED ORDER — GUAIFENESIN ER 600 MG PO TB12
600.0000 mg | ORAL_TABLET | Freq: Two times a day (BID) | ORAL | Status: DC
  Administered 2024-01-19 – 2024-01-20 (×3): 600 mg via ORAL
  Filled 2024-01-19 (×3): qty 1

## 2024-01-19 MED ORDER — BISACODYL 10 MG RE SUPP: 10.0000 mg | RECTAL | Status: DC | PRN

## 2024-01-19 MED ORDER — METHYLPREDNISOLONE SODIUM SUCC 40 MG IJ SOLR
40.0000 mg | Freq: Two times a day (BID) | INTRAMUSCULAR | Status: DC
  Administered 2024-01-19 (×2): 40 mg via INTRAVENOUS
  Filled 2024-01-19 (×2): qty 1

## 2024-01-19 MED ORDER — CEFUROXIME AXETIL 250 MG PO TABS
250.0000 mg | ORAL_TABLET | Freq: Two times a day (BID) | ORAL | Status: DC
  Administered 2024-01-19 – 2024-01-20 (×2): 250 mg via ORAL
  Filled 2024-01-19 (×3): qty 1

## 2024-01-19 MED ORDER — ALBUTEROL SULFATE HFA 108 (90 BASE) MCG/ACT IN AERS: 1.0000 | INHALATION_SPRAY | RESPIRATORY_TRACT | Status: DC | PRN

## 2024-01-19 MED ORDER — ENOXAPARIN SODIUM 40 MG/0.4ML IJ SOSY
40.0000 mg | PREFILLED_SYRINGE | INTRAMUSCULAR | Status: DC
  Administered 2024-01-19: 40 mg via SUBCUTANEOUS
  Filled 2024-01-19: qty 0.4

## 2024-01-19 MED ORDER — ACETAMINOPHEN 325 MG PO TABS
650.0000 mg | ORAL_TABLET | Freq: Four times a day (QID) | ORAL | Status: DC | PRN
  Administered 2024-01-20: 650 mg via ORAL
  Filled 2024-01-19: qty 2

## 2024-01-19 MED ORDER — IPRATROPIUM-ALBUTEROL 0.5-2.5 (3) MG/3ML IN SOLN
3.0000 mL | Freq: Two times a day (BID) | RESPIRATORY_TRACT | Status: DC
  Administered 2024-01-19: 3 mL via RESPIRATORY_TRACT
  Filled 2024-01-19 (×2): qty 3

## 2024-01-19 NOTE — Evaluation (Signed)
 Physical Therapy Evaluation Patient Details Name: Erica Ball MRN: [id number] DOB: [birth year] Today's Date: 01/19/2024  History of Present Illness  72/F presented to the ED with left-sided pleuritic chest pain, worse with deep inspiration.  In addition mild worsening of chronic cough and some dyspnea; with history of COPD, tobacco use, SVT, diastolic CHF, hypertension, dyslipidemia, resident of SNF and undergoing PT for gait and balance  Clinical Impression   Pt admitted with above diagnosis. Lives at [facility] as a resident; Prior to admission, pt was working with PT to decr her risk of falls; Presents to PT with significant generalized weakness with difficulty rising to stand from recliner seat, Pain under L breast effecting her ability to take deep breaths, Variable O2 sats with activity -- tending to desat below 88% on room air;  Needs heavy mod assist to stand from recliner seat, and had difficulty with attempts to march in place in room today; Recommend dc back to [place] and continue PT efforts there; Pt currently with functional limitations due to the deficits listed below (see PT Problem List). Pt will benefit from skilled PT to increase their independence and safety with mobility to allow discharge to the venue listed below.   Will follow while in house and continue efforts to wean from supplemental O2          If plan is discharge home, recommend the following: A lot of help with walking and/or transfers;A lot of help with bathing/dressing/bathroom   Can travel by private vehicle   No (Perhaps soon)    Equipment Recommendations Other (comment) (Can be defered to PT at SNF where she resides)  Recommendations for Other Services  Other (comment) (Mobility Team)    Functional Status Assessment Patient has had a recent decline in their functional status and demonstrates the ability to make significant improvements in function in a reasonable and predictable amount of time.      Precautions / Restrictions Precautions Precautions: Fall Recall of Precautions/Restrictions: Intact Precaution/Restrictions Comments: Watch O2 sats Restrictions Weight Bearing Restrictions Per Provider Order: No      Mobility  Bed Mobility               General bed mobility comments: in recliner upon arrival    Transfers Overall transfer level: Needs assistance Equipment used: Rolling walker (2 wheels) Transfers: Sit to/from Stand Sit to Stand: Mod assist           General transfer comment: Heavy mod assist to rise from recliner seat with pt using armrests to push up on    Ambulation/Gait Ambulation/Gait assistance: Min assist Gait Distance (Feet):  (Briefly marched in place) Assistive device: Rolling walker (2 wheels)         General Gait Details: Decr step height, incr coughing, and pt had to sit  Stairs            Wheelchair Mobility     Tilt Bed    Modified Rankin (Stroke Patients Only)       Balance Overall balance assessment: Needs assistance   Sitting balance-Leahy Scale: Fair       Standing balance-Leahy Scale: Poor Standing balance comment: dependent on UE support                             Pertinent Vitals/Pain Pain Assessment Pain Assessment: 0-10 Pain Score: 8  Pain Location: Ribs just inder middle of L breast; Very tender to palpation Pain Descriptors /  Indicators: Grimacing, Guarding Pain Intervention(s): Monitored during session, Other (comment) (Taught pillow splinting)    Home Living Family/patient expects to be discharged to:: Assisted living (Resident at [facility])                 Home Equipment: Agricultural consultant (2 wheels) Additional Comments: Recent falls, and pt is undergoing PT to address falls risk at SNF    Prior Function Prior Level of Function : Needs assist             Mobility Comments: Uses a RW for amb; recent falls ADLs Comments: Assist from staff      Extremity/Trunk Assessment   Upper Extremity Assessment Upper Extremity Assessment: Defer to OT evaluation    Lower Extremity Assessment Lower Extremity Assessment: Generalized weakness (significant difficulty with rise from sit to stand)       Communication   Communication Communication: Other (comment) Factors Affecting Communication: Hearing impaired    Cognition Arousal: Alert Behavior During Therapy: WFL for tasks assessed/performed   PT - Cognitive impairments: No family/caregiver present to determine baseline                         Following commands: Intact       Cueing Cueing Techniques: Verbal cues, Other (comments) (demonstration)     General Comments General comments (skin integrity, edema, etc.): See other PT note of this date    Exercises Other Exercises Other Exercises: Performed 5 draws on incentive spirometer   Assessment/Plan    PT Assessment Patient needs continued PT services  PT Problem List Decreased strength;Decreased activity tolerance;Decreased balance;Decreased mobility;Decreased coordination;Decreased knowledge of use of DME;Decreased safety awareness;Cardiopulmonary status limiting activity;Decreased knowledge of precautions;Pain       PT Treatment Interventions DME instruction;Gait training;Functional mobility training;Therapeutic activities;Therapeutic exercise;Balance training;Neuromuscular re-education;Cognitive remediation;Patient/family education;Manual techniques    PT Goals (Current goals can be found in the Care Plan section)  Acute Rehab PT Goals Patient Stated Goal: Less pain when she breathes in PT Goal Formulation: With patient Time For Goal Achievement: 02/02/24 Potential to Achieve Goals: Good    Frequency Min 2X/week     Co-evaluation               AM-PAC PT 6 Clicks Mobility  Outcome Measure Help needed turning from your back to your side while in a flat bed without using bedrails?: A  Little Help needed moving from lying on your back to sitting on the side of a flat bed without using bedrails?: A Little Help needed moving to and from a bed to a chair (including a wheelchair)?: A Lot Help needed standing up from a chair using your arms (e.g., wheelchair or bedside chair)?: A Lot Help needed to walk in hospital room?: A Lot Help needed climbing 3-5 steps with a railing? : A Lot 6 Click Score: 14    End of Session Equipment Utilized During Treatment: Oxygen (prn) Activity Tolerance: Patient limited by pain Patient left: in chair;with call bell/phone within reach Nurse Communication: Mobility status PT Visit Diagnosis: Unsteadiness on feet (R26.81);Other abnormalities of gait and mobility (R26.89);Muscle weakness (generalized) (M62.81);History of falling (Z91.81)    Time: 1030-1057 PT Time Calculation (min) (ACUTE ONLY): 27 min   Charges:   PT Evaluation $PT Eval Moderate Complexity: 1 Mod PT Treatments $Therapeutic Activity: 8-22 mins PT General Charges $$ ACUTE PT VISIT: 1 Visit         Silvano Currier, PT  Acute Rehabilitation Services Office [phone]  Secure Chat welcomed   Silvano VEAR Currier 01/19/2024, 11:28 AM

## 2024-01-19 NOTE — Care Management Obs Status (Signed)
 MEDICARE OBSERVATION STATUS NOTIFICATION   Patient Details  Name: Erica Ball MRN: [id number] Date of Birth: [birth year]   Medicare Observation Status Notification Given:  Yes    Mayley Lish G., RN 01/19/2024, 8:54 AM

## 2024-01-19 NOTE — Progress Notes (Addendum)
 PROGRESS NOTE    Erica Ball  FMW:[id number] DOB: 1950/09/09 DOA: 01/18/2024 PCP: Patient, No Pcp Per  72/F with history of COPD, tobacco use, SVT, diastolic CHF, hypertension, dyslipidemia, resident of SNF presented to the ED with left-sided pleuritic chest pain, worse with deep inspiration.  In addition mild worsening of chronic cough and some dyspnea. - In the ER was noted to be hypoxic, troponin negative, CTA negative for PE, some airway thickening noted and subacute T4 fracture   Subjective: -Still having left-sided pleuritic chest pain, moderate cough  Assessment and Plan:  Pleuritic chest pain COPD exacerbation, bronchitis -CTA chest negative for PE, flu COVID and RSV PCR negative - Start IV steroids today, ceftriaxone , DuoNebs - Incentive spirometry  Acute on chronic respiratory failure - Secondary to above, attempt to wean O2 as tolerated, IS  T4 compression fracture - Patient reports fall over a month ago, denies any symptoms in her back - Supportive care, follow-up with PCP for osteoporosis management  Tobacco use On remission  Chronic diastolic CHF - Appears euvolemic, monitor -Echo last year with preserved EF, normal RV  DVT prophylaxis: Add Lovenox  Code Status: Full code Family Communication: Disposition Plan:   Consultants:    Procedures:   Antimicrobials:    Objective: Vitals:   01/19/24 0201 01/19/24 0222 01/19/24 0525 01/19/24 0828  BP: 114/63 (!) 149/72 (!) 144/69 (!) 143/82  Pulse: (!) 58 69 64 60  Resp: 16 18 18 17   Temp: 98 F (36.7 C) 97.6 F (36.4 C) 98.2 F (36.8 C) 97.7 F (36.5 C)  TempSrc:  Oral  Oral  SpO2: 98% 95% 95% 96%  Weight:      Height:        Intake/Output Summary (Last 24 hours) at 01/19/2024 1037 Last data filed at 01/18/2024 2328 Gross per 24 hour  Intake --  Output 300 ml  Net -300 ml   Filed Weights   01/18/24 0911  Weight: 77.1 kg    Examination:  General exam: Appears calm and comfortable  AO x 3 Respiratory system: Air movement, few expiratory wheezes Cardiovascular system: S1 & S2 heard, RRR.  Abd: nondistended, soft and nontender.Normal bowel sounds heard. Central nervous system: Alert and oriented. No focal neurological deficits. Extremities: no edema Skin: No rashes Psychiatry:  Mood & affect appropriate.     Data Reviewed:   CBC: Recent Labs  Lab 01/18/24 0934 01/18/24 0939  WBC 7.0  --   HGB 13.3 13.9  HCT 41.2 41.0  MCV 100.2*  --   PLT 191  --    Basic Metabolic Panel: Recent Labs  Lab 01/18/24 0934 01/18/24 0939  NA 139 141  K 4.3 3.9  CL 98 98  CO2 30  --   GLUCOSE 106* 108*  BUN 16 17  CREATININE 0.87 0.90  CALCIUM  8.9  --    GFR: Estimated Creatinine Clearance: 56.4 mL/min (by C-G formula based on SCr of 0.9 mg/dL). Liver Function Tests: No results for input(s): AST, ALT, ALKPHOS, BILITOT, PROT, ALBUMIN in the last 168 hours. No results for input(s): LIPASE, AMYLASE in the last 168 hours. No results for input(s): AMMONIA in the last 168 hours. Coagulation Profile: No results for input(s): INR, PROTIME in the last 168 hours. Cardiac Enzymes: No results for input(s): CKTOTAL, CKMB, CKMBINDEX, TROPONINI in the last 168 hours. BNP (last 3 results) No results for input(s): PROBNP in the last 8760 hours. HbA1C: No results for input(s): HGBA1C in the last 72 hours. CBG: No results  for input(s): GLUCAP in the last 168 hours. Lipid Profile: No results for input(s): CHOL, HDL, LDLCALC, TRIG, CHOLHDL, LDLDIRECT in the last 72 hours. Thyroid Function Tests: No results for input(s): TSH, T4TOTAL, FREET4, T3FREE, THYROIDAB in the last 72 hours. Anemia Panel: No results for input(s): VITAMINB12, FOLATE, FERRITIN, TIBC, IRON, RETICCTPCT in the last 72 hours. Urine analysis:    Component Value Date/Time   COLORURINE COLORLESS (A) 12/12/2022 0017   APPEARANCEUR CLEAR  12/12/2022 0017   LABSPEC 1.003 (L) 12/12/2022 0017   PHURINE 8.0 12/12/2022 0017   GLUCOSEU NEGATIVE 12/12/2022 0017   HGBUR NEGATIVE 12/12/2022 0017   BILIRUBINUR NEGATIVE 12/12/2022 0017   BILIRUBINUR neg 10/11/2011 1425   KETONESUR NEGATIVE 12/12/2022 0017   PROTEINUR NEGATIVE 12/12/2022 0017   UROBILINOGEN 0.2 10/11/2011 1425   UROBILINOGEN 0.2 01/24/2008 2217   NITRITE NEGATIVE 12/12/2022 0017   LEUKOCYTESUR TRACE (A) 12/12/2022 0017   Sepsis Labs: @LABRCNTIP (procalcitonin:4,lacticidven:4)  ) Recent Results (from the past 240 hours)  Resp panel by RT-PCR (RSV, Flu A&B, Covid) Anterior Nasal Swab     Status: None   Collection Time: 01/18/24  3:53 PM   Specimen: Anterior Nasal Swab  Result Value Ref Range Status   SARS Coronavirus 2 by RT PCR NEGATIVE NEGATIVE Final   Influenza A by PCR NEGATIVE NEGATIVE Final   Influenza B by PCR NEGATIVE NEGATIVE Final    Comment: (NOTE) The Xpert Xpress SARS-CoV-2/FLU/RSV plus assay is intended as an aid in the diagnosis of influenza from Nasopharyngeal swab specimens and should not be used as a sole basis for treatment. Nasal washings and aspirates are unacceptable for Xpert Xpress SARS-CoV-2/FLU/RSV testing.  Fact Sheet for Patients: BloggerCourse.com  Fact Sheet for Healthcare Providers: SeriousBroker.it  This test is not yet approved or cleared by the [place]  FDA and has been authorized for detection and/or diagnosis of SARS-CoV-2 by FDA under an Emergency Use Authorization (EUA). This EUA will remain in effect (meaning this test can be used) for the duration of the COVID-19 declaration under Section 564(b)(1) of the Act, 21 U.S.C. section 360bbb-3(b)(1), unless the authorization is terminated or revoked.     Resp Syncytial Virus by PCR NEGATIVE NEGATIVE Final    Comment: (NOTE) Fact Sheet for Patients: BloggerCourse.com  Fact Sheet for  Healthcare Providers: SeriousBroker.it  This test is not yet approved or cleared by the [place]  FDA and has been authorized for detection and/or diagnosis of SARS-CoV-2 by FDA under an Emergency Use Authorization (EUA). This EUA will remain in effect (meaning this test can be used) for the duration of the COVID-19 declaration under Section 564(b)(1) of the Act, 21 U.S.C. section 360bbb-3(b)(1), unless the authorization is terminated or revoked.  Performed at [facility], [address], [place], [place] [address]      Radiology Studies: CT Angio Chest PE W/Cm &/Or Wo Cm Result Date: 01/18/2024 CLINICAL DATA:  Pleuritic left chest pain starting overnight. Mild hypoxia with increasing shortness of breath EXAM: CT ANGIOGRAPHY CHEST WITH CONTRAST TECHNIQUE: Multidetector CT imaging of the chest was performed using the standard protocol during bolus administration of intravenous contrast. Multiplanar CT image reconstructions and MIPs were obtained to evaluate the vascular anatomy. RADIATION DOSE REDUCTION: This exam was performed according to the departmental dose-optimization program which includes automated exposure control, adjustment of the mA and/or kV according to patient size and/or use of iterative reconstruction technique. CONTRAST:  75mL OMNIPAQUE  IOHEXOL  350 MG/ML SOLN COMPARISON:  Radiograph 01/18/2024 and CT chest 09/17/2017 FINDINGS: Cardiovascular:  No filling defect is identified in the pulmonary arterial tree to suggest pulmonary embolus. Coronary, aortic arch, and branch vessel atherosclerotic vascular disease. Mild cardiomegaly particularly affecting the right ventricle. Mild aortic and mitral valve calcification. Mediastinum/Nodes: Mildly prominent right hilar lymph node 1.1 cm in short axis diameter. Upper normal sized paratracheal and AP window lymph nodes. Lungs/Pleura: Narrowing of the lower lobe bronchi may reflect mild bronchomalacia. Airway  thickening with mosaic attenuation pattern in the lungs potentially from air trapping. Bandlike atelectasis in the right middle lobe and both lower lobes. Upper Abdomen: Unremarkable where included. Musculoskeletal: Old healed bilateral rib fractures. Old healed sternal fracture. Chronic anterior wedge compression fracture at T3. Subacute superior endplate compression fracture at T4 with faint sclerosis along the superior endplate and 20% loss of vertebral height. Review of the MIP images confirms the above findings. IMPRESSION: 1. No filling defect is identified in the pulmonary arterial tree to suggest pulmonary embolus. 2. Airway thickening with mosaic attenuation pattern in the lungs potentially from air trapping. 3. Mildly prominent right hilar lymph node at 1.1 cm in short axis diameter, nonspecific. 4. Subacute superior endplate compression fracture at T4 with 20% loss of vertebral height. 5. Old healed bilateral rib fractures and old healed sternal fracture. 6. Mild cardiomegaly particularly affecting the right ventricle. 7.  Aortic Atherosclerosis (ICD10-I70.0). Electronically Signed   By: Ryan Salvage M.D.   On: 01/18/2024 12:21   DG Chest Portable 1 View Result Date: 01/18/2024 EXAM: 1 VIEW XRAY OF THE CHEST 01/18/2024 09:44:00 AM COMPARISON: 12/11/2022 CLINICAL HISTORY: Chest pain, SOB. Per ED triage notes: [facility] from [facility] with ems c.o left sided chest pain underneath her breast that started overnight. Pain worse with a deep breath. ; Pt was 88% on room air, pt given 4L [place] en route. Pt c.o increased SOB over the past week, hx of COPD but not on any oxygen FINDINGS: LUNGS AND PLEURA: No focal pulmonary opacity. No pulmonary edema. No pleural effusion. No pneumothorax. Bilateral hilar prominence, likely prominent vasculature. HEART AND MEDIASTINUM: No acute abnormality of the cardiac and mediastinal silhouettes. Atherosclerotic plaque. BONES AND SOFT TISSUES: No acute osseous abnormality.  IMPRESSION: 1. No acute findings. 2. Bilateral hilar prominence, likely prominent vasculature. Electronically signed by: Waddell Calk MD 01/18/2024 10:08 AM EDT RP Workstation: [id number]     Scheduled Meds:  aspirin  EC  81 mg Oral Daily   budesonide  (PULMICORT ) nebulizer solution  2 mg Nebulization Q12H   cyanocobalamin   1,000 mcg Oral Daily   feeding supplement  237 mL Oral BID BM   folic acid   1 mg Oral Daily   guaiFENesin   600 mg Oral BID   hydrALAZINE   25 mg Oral TID   ipratropium-albuterol   3 mL Nebulization QID   magnesium  oxide  400 mg Oral Daily   methylPREDNISolone  (SOLU-MEDROL ) injection  40 mg Intravenous Q12H   pantoprazole   40 mg Oral Daily   rosuvastatin   10 mg Oral Daily   sodium chloride  flush  3 mL Intravenous Q12H   sodium chloride   1 g Oral BID WC   thiamine   100 mg Oral Daily   Continuous Infusions:  sodium chloride        LOS: 0 days    Time spent:    Sigurd Pac, MD [facility]   01/19/2024, 10:37 AM

## 2024-01-19 NOTE — Progress Notes (Signed)
 Physical Therapy Note  (Full PT eval to follow)  SATURATION QUALIFICATIONS: (This note is used to comply with regulatory documentation for home oxygen)  Patient Saturations on Room Air at Rest = 90%  Patient Saturations on Room Air while mobilizing (sit<>stand, standing tolerance) = 84%  Patient Saturations on 2 Liters of oxygen while mobilizing (sit<>stand, standing tolerance) = 93%  Please briefly explain why patient needs home oxygen: Patient requires supplemental oxygen to maintain oxygen saturations at acceptable, safe levels with physical activity.   Provided pt with Incentive Spirometer,  Observed pt using IS correctly,  Educated pt on using a pillow or blanket roll to splint painful area and allow for deeper breaths,    Will continue to follow,   Silvano Currier, PT  Acute Rehabilitation Services Office [phone] Secure Chat welcomed

## 2024-01-19 NOTE — Plan of Care (Signed)

## 2024-01-19 NOTE — Plan of Care (Signed)

## 2024-01-19 NOTE — Evaluation (Signed)
 Occupational Therapy Evaluation Patient Details Name: Erica Ball MRN: [id number] DOB: 08/16/1950 Today's Date: 01/19/2024   History of Present Illness   72/F presented to the ED with left-sided pleuritic chest pain, worse with deep inspiration.  In addition mild worsening of chronic cough and some dyspnea; with history of COPD, tobacco use, SVT, diastolic CHF, hypertension, dyslipidemia, resident of SNF and undergoing PT for gait and balance     Clinical Impressions Pt c/o 8/10 pain to L chest under L breast, pain with breathing and at rest. Pt lives at [facility], plans to return, working with PT to decrease fall risk, PLOF mod I for most ADLs, uses w/c around room mainly, RW with therapy and with family on weekends. Pt currently close to baseline but limited due to pain and SOB, O2 levels remained above 94 during session with/without O2 supplementation. Pt would benefit from continued acute OT to maximize activity tolerance, return to postacute rehab appropriate.      If plan is discharge home, recommend the following:   A little help with walking and/or transfers;A little help with bathing/dressing/bathroom;Assistance with cooking/housework;Assist for transportation;Help with stairs or ramp for entrance     Functional Status Assessment   Patient has had a recent decline in their functional status and demonstrates the ability to make significant improvements in function in a reasonable and predictable amount of time.     Equipment Recommendations   None recommended by OT     Recommendations for Other Services         Precautions/Restrictions   Precautions Precautions: Fall Recall of Precautions/Restrictions: Intact Precaution/Restrictions Comments: Watch O2 sats Restrictions Weight Bearing Restrictions Per Provider Order: No     Mobility Bed Mobility               General bed mobility comments: in recliner    Transfers Overall transfer level:  Needs assistance Equipment used: Rolling walker (2 wheels) Transfers: Sit to/from Stand, Bed to chair/wheelchair/BSC Sit to Stand: Contact guard assist     Step pivot transfers: Contact guard assist     General transfer comment: CGA for STS from recliner and ambulating 10 feet      Balance Overall balance assessment: Needs assistance Sitting-balance support: No upper extremity supported, Feet supported Sitting balance-Leahy Scale: Fair     Standing balance support: Bilateral upper extremity supported, During functional activity Standing balance-Leahy Scale: Poor Standing balance comment: reliant on RW for support                           ADL either performed or assessed with clinical judgement   ADL Overall ADL's : Needs assistance/impaired                                       General ADL Comments: set up/supervision for most ADLs, increased time for UB ADLs due to significantly limited ROM to B shoulders, but able to complete ADLs with increased time at baseline.     Vision Baseline Vision/History: 1 Wears glasses Ability to See in Adequate Light: 0 Adequate Patient Visual Report: No change from baseline       Perception         Praxis         Pertinent Vitals/Pain Pain Assessment Pain Assessment: 0-10 Pain Score: 8  Pain Location: Ribs just inder middle of L breast; Very tender  to palpation Pain Descriptors / Indicators: Grimacing, Guarding Pain Intervention(s): Monitored during session     Extremity/Trunk Assessment Upper Extremity Assessment Upper Extremity Assessment: Generalized weakness   Lower Extremity Assessment Lower Extremity Assessment: Defer to PT evaluation       Communication Communication Communication: Impaired Factors Affecting Communication: Hearing impaired   Cognition Arousal: Alert Behavior During Therapy: WFL for tasks assessed/performed Cognition: No apparent impairments                                Following commands: Intact       Cueing  General Comments   Cueing Techniques: Verbal cues  See other PT note of this date   Exercises     Shoulder Instructions      Home Living Family/patient expects to be discharged to:: Assisted living                             Home Equipment: Rolling Walker (2 wheels);Shower seat;Wheelchair - manual   Additional Comments: Recent falls, and pt is undergoing PT to address falls risk at SNF      Prior Functioning/Environment Prior Level of Function : Needs assist             Mobility Comments: uses w/c around room, RW with family on weekends ADLs Comments: mod I for ADLs.    OT Problem List: Decreased strength;Decreased activity tolerance;Impaired balance (sitting and/or standing);Pain   OT Treatment/Interventions: Self-care/ADL training;Therapeutic exercise;Energy conservation;DME and/or AE instruction;Therapeutic activities;Patient/family education;Balance training      OT Goals(Current goals can be found in the care plan section)   Acute Rehab OT Goals Patient Stated Goal: to manage pain OT Goal Formulation: With patient Time For Goal Achievement: 02/02/24 Potential to Achieve Goals: Good   OT Frequency:  Min 2X/week    Co-evaluation PT/OT/SLP Co-Evaluation/Treatment: Yes            AM-PAC OT 6 Clicks Daily Activity     Outcome Measure Help from another person eating meals?: None Help from another person taking care of personal grooming?: A Little Help from another person toileting, which includes using toliet, bedpan, or urinal?: A Little Help from another person bathing (including washing, rinsing, drying)?: A Little Help from another person to put on and taking off regular upper body clothing?: A Little Help from another person to put on and taking off regular lower body clothing?: A Little 6 Click Score: 19   End of Session Equipment Utilized During Treatment: Gait  belt;Rolling walker (2 wheels) Nurse Communication: Mobility status  Activity Tolerance: Patient tolerated treatment well Patient left: in chair;with call bell/phone within reach  OT Visit Diagnosis: Unsteadiness on feet (R26.81);Other abnormalities of gait and mobility (R26.89);Repeated falls (R29.6);Muscle weakness (generalized) (M62.81);Pain Pain - Right/Left: Left Pain - part of body:  (chest)                Time: 8758-8695 OT Time Calculation (min): 23 min Charges:  OT General Charges $OT Visit: 1 Visit OT Evaluation $OT Eval Low Complexity: 1 Low OT Treatments $Self Care/Home Management : 8-22 mins  [place], OTR/L   Elouise JONELLE Bott 01/19/2024, 1:45 PM

## 2024-01-20 DIAGNOSIS — J9601 Acute respiratory failure with hypoxia: Secondary | ICD-10-CM | POA: Diagnosis not present

## 2024-01-20 LAB — BASIC METABOLIC PANEL WITH GFR
Anion gap: 10 (ref 5–15)
BUN: 20 mg/dL (ref 8–23)
CO2: 28 mmol/L (ref 22–32)
Calcium: 8.7 mg/dL — ABNORMAL LOW (ref 8.9–10.3)
Chloride: 96 mmol/L — ABNORMAL LOW (ref 98–111)
Creatinine, Ser: 0.82 mg/dL (ref 0.44–1.00)
GFR, Estimated: >60 mL/min (ref >60)
Glucose, Bld: 165 mg/dL — ABNORMAL HIGH (ref 70–99)
Potassium: 4.3 mmol/L (ref 3.5–5.1)
Sodium: 134 mmol/L — ABNORMAL LOW (ref 135–145)

## 2024-01-20 MED ORDER — HYDRALAZINE HCL 25 MG PO TABS: 25.0000 mg | ORAL_TABLET | Freq: Three times a day (TID) | ORAL | Status: AC

## 2024-01-20 MED ORDER — PREDNISONE 20 MG PO TABS: 20.0000 mg | ORAL_TABLET | Freq: Every day | ORAL | Status: AC

## 2024-01-20 MED ORDER — CEFUROXIME AXETIL 250 MG PO TABS: 250.0000 mg | ORAL_TABLET | Freq: Two times a day (BID) | ORAL | Status: AC

## 2024-01-20 NOTE — Progress Notes (Signed)
 DISCHARGE NOTE SNF [facility] to be discharged Skilled nursing facility per MD order. Patient verbalized understanding.  Skin clean, dry and intact without evidence of skin break down, no evidence of skin tears noted. IV catheter discontinued intact. Site without signs and symptoms of complications. Dressing and pressure applied. Pt denies pain at the site currently. No complaints noted.  Patient free of lines, drains, and wounds.   Discharge packet assembled. An After Visit Summary (AVS) was printed and given to the EMS personnel. Patient escorted via stretcher and discharged to [facility] via ambulance. Report called to accepting facility; all questions and concerns addressed.   Author [place] K Caydin Yeatts, RN

## 2024-01-20 NOTE — Discharge Summary (Signed)
 Physician Discharge Summary  ARLIS EVERLY FMW:[id number] DOB: 1951/02/03 DOA: 01/18/2024  PCP: Patient, No Pcp Per  Admit date: 01/18/2024 Discharge date: 01/20/2024  Time spent: 45 minutes  Recommendations for Outpatient Follow-up:  PCP in 1 week   Discharge Diagnoses:  Principal Problem: Pleuritic chest wall pain\ Bronchitis COPD Acute on chronic hypoxic respiratory failure Hypertension   Tobacco abuse Subacute versus chronic T4 vertebral fracture (HCC)   Discharge Condition: Improved  Diet recommendation: [place], heart healthy  Filed Weights   01/18/24 0911  Weight: 77.1 kg    History of present illness:  72/F with history of COPD, tobacco use, SVT, diastolic CHF, hypertension, dyslipidemia, resident of SNF presented to the ED with left-sided pleuritic chest pain, worse with deep inspiration.  In addition mild worsening of chronic cough and some dyspnea. - In the ER was noted to be hypoxic, troponin negative, CTA negative for PE, some airway thickening noted and subacute T4 fracture  Hospital Course:   Pleuritic chest pain COPD exacerbation, bronchitis -CTA chest negative for PE, flu COVID and RSV PCR negative, troponins negative as well -Also suspected musculoskeletal component to her lateral chest wall pain, history of fall few weeks ago - Improving on steroids, antibiotics and nebs, switch to oral prednisone  for few more days -Charged back to SNF, follow-up with PCP in 1 week   Acute on chronic respiratory failure COPD -Weaned off O2 at rest however did desaturate with activity, set up with 2 L O2   T4 compression fracture - Patient reports fall over a month ago, denies any symptoms in her back, on imaging this is felt to be subacute or chronic - Supportive care, follow-up with PCP for osteoporosis management   Tobacco use -In remission   Chronic diastolic CHF - Appears euvolemic,, resume home regimen of torsemide -Echo last year with preserved  EF, normal RV  Consultations:   Discharge Exam: Vitals:   01/20/24 0517 01/20/24 0812  BP: 125/79 (!) 149/91  Pulse: 82 (!) 101  Resp: 16 18  Temp: 97.9 F (36.6 C) 98.1 F (36.7 C)  SpO2: 94% 91%     Discharge Instructions   Discharge Instructions     Diet - low sodium heart healthy   Complete by: As directed    Increase activity slowly   Complete by: As directed       Allergies as of 01/20/2024       Reactions   Dextromethorphan Other (See Comments)   Severe aggression        Medication List     TAKE these medications    acetaminophen  325 MG tablet Commonly known as: TYLENOL  Take 650 mg by mouth every 6 (six) hours as needed (general discomfort).   albuterol  108 (90 Base) MCG/ACT inhaler Commonly known as: VENTOLIN  HFA Inhale 1-2 puffs into the lungs every 4 (four) hours as needed for shortness of breath.   amLODipine  5 MG tablet Commonly known as: NORVASC  Take 5 mg by mouth daily.   aspirin  EC 81 MG tablet Take 1 tablet (81 mg total) by mouth daily.   bisacodyl  10 MG suppository Commonly known as: DULCOLAX Place 10 mg rectally daily as needed (constipation).   calcium  carbonate 1500 (600 Ca) MG Tabs tablet Commonly known as: OSCAL Take 600 mg of elemental calcium  by mouth daily.   cefUROXime  250 MG tablet Commonly known as: CEFTIN  Take 1 tablet (250 mg total) by mouth 2 (two) times daily with a meal for 3 days.   cyanocobalamin   1000 MCG tablet Take 1 tablet (1,000 mcg total) by mouth daily.   folic acid  1 MG tablet Commonly known as: FOLVITE  Take 1 tablet (1 mg total) by mouth daily.   hydrALAZINE  25 MG tablet Commonly known as: APRESOLINE  Take 1 tablet (25 mg total) by mouth 3 (three) times daily. What changed: Another medication with the same name was removed. Continue taking this medication, and follow the directions you see here.   magnesium  oxide 400 (240 Mg) MG tablet Commonly known as: MAG-OX Take 1 tablet (400 mg total) by  mouth daily.   meloxicam 15 MG tablet Commonly known as: MOBIC Take 15 mg by mouth daily.   Menthol (Topical Analgesic) 4 % Gel Apply 1 application  topically See admin instructions. Apply to joints topically as needed for pain. Apply to joints PRN - Therapist only.   metoprolol  succinate 100 MG 24 hr tablet Commonly known as: Toprol  XL Take 1 tablet (100 mg total) by mouth daily. Take with or immediately following a meal.   metoprolol  tartrate 25 MG tablet Commonly known as: LOPRESSOR  Take 25 mg by mouth daily at 6 PM.   ondansetron  4 MG tablet Commonly known as: ZOFRAN  Take 1 tablet (4 mg total) by mouth every 6 (six) hours as needed for nausea.   pantoprazole  40 MG tablet Commonly known as: PROTONIX  Take 1 tablet (40 mg total) by mouth daily.   predniSONE  20 MG tablet Commonly known as: DELTASONE  Take 1 tablet (20 mg total) by mouth daily with breakfast for 3 days.   promethazine -dextromethorphan 6.25-15 MG/5ML syrup Commonly known as: PROMETHAZINE -DM Take 5 mLs by mouth every 6 (six) hours as needed for cough.   rosuvastatin  10 MG tablet Commonly known as: CRESTOR  Take 1 tablet (10 mg total) by mouth daily. What changed: when to take this   sodium chloride  1 g tablet Take 1-2 g by mouth See admin instructions. Give 2 tablets (2g) by mouth twice daily (0800 and 1600) and give 1 tablet (1g) at 1400.   thiamine  100 MG tablet Commonly known as: Vitamin B-1 Take 1 tablet (100 mg total) by mouth daily.   torsemide 20 MG tablet Commonly known as: DEMADEX Take 20 mg by mouth daily.       Allergies  Allergen Reactions   Dextromethorphan Other (See Comments)    Severe aggression       The results of significant diagnostics from this hospitalization (including imaging, microbiology, ancillary and laboratory) are listed below for reference.    Significant Diagnostic Studies: CT Angio Chest PE W/Cm &/Or Wo Cm Result Date: 01/18/2024 CLINICAL DATA:  Pleuritic  left chest pain starting overnight. Mild hypoxia with increasing shortness of breath EXAM: CT ANGIOGRAPHY CHEST WITH CONTRAST TECHNIQUE: Multidetector CT imaging of the chest was performed using the standard protocol during bolus administration of intravenous contrast. Multiplanar CT image reconstructions and MIPs were obtained to evaluate the vascular anatomy. RADIATION DOSE REDUCTION: This exam was performed according to the departmental dose-optimization program which includes automated exposure control, adjustment of the mA and/or kV according to patient size and/or use of iterative reconstruction technique. CONTRAST:  75mL OMNIPAQUE  IOHEXOL  350 MG/ML SOLN COMPARISON:  Radiograph 01/18/2024 and CT chest 09/17/2017 FINDINGS: Cardiovascular: No filling defect is identified in the pulmonary arterial tree to suggest pulmonary embolus. Coronary, aortic arch, and branch vessel atherosclerotic vascular disease. Mild cardiomegaly particularly affecting the right ventricle. Mild aortic and mitral valve calcification. Mediastinum/Nodes: Mildly prominent right hilar lymph node 1.1 cm in short axis diameter.  Upper normal sized paratracheal and AP window lymph nodes. Lungs/Pleura: Narrowing of the lower lobe bronchi may reflect mild bronchomalacia. Airway thickening with mosaic attenuation pattern in the lungs potentially from air trapping. Bandlike atelectasis in the right middle lobe and both lower lobes. Upper Abdomen: Unremarkable where included. Musculoskeletal: Old healed bilateral rib fractures. Old healed sternal fracture. Chronic anterior wedge compression fracture at T3. Subacute superior endplate compression fracture at T4 with faint sclerosis along the superior endplate and 20% loss of vertebral height. Review of the MIP images confirms the above findings. IMPRESSION: 1. No filling defect is identified in the pulmonary arterial tree to suggest pulmonary embolus. 2. Airway thickening with mosaic attenuation pattern  in the lungs potentially from air trapping. 3. Mildly prominent right hilar lymph node at 1.1 cm in short axis diameter, nonspecific. 4. Subacute superior endplate compression fracture at T4 with 20% loss of vertebral height. 5. Old healed bilateral rib fractures and old healed sternal fracture. 6. Mild cardiomegaly particularly affecting the right ventricle. 7.  Aortic Atherosclerosis (ICD10-I70.0). Electronically Signed   By: Ryan Salvage M.D.   On: 01/18/2024 12:21   DG Chest Portable 1 View Result Date: 01/18/2024 EXAM: 1 VIEW XRAY OF THE CHEST 01/18/2024 09:44:00 AM COMPARISON: 12/11/2022 CLINICAL HISTORY: Chest pain, SOB. Per ED triage notes: Pt from [facility] with ems c.o left sided chest pain underneath her breast that started overnight. Pain worse with a deep breath. ; Pt was 88% on room air, pt given 4L [place] en route. Pt c.o increased SOB over the past week, hx of COPD but not on any oxygen FINDINGS: LUNGS AND PLEURA: No focal pulmonary opacity. No pulmonary edema. No pleural effusion. No pneumothorax. Bilateral hilar prominence, likely prominent vasculature. HEART AND MEDIASTINUM: No acute abnormality of the cardiac and mediastinal silhouettes. Atherosclerotic plaque. BONES AND SOFT TISSUES: No acute osseous abnormality. IMPRESSION: 1. No acute findings. 2. Bilateral hilar prominence, likely prominent vasculature. Electronically signed by: Waddell Calk MD 01/18/2024 10:08 AM EDT RP Workstation: [id number]    Microbiology: Recent Results (from the past 240 hours)  Resp panel by RT-PCR (RSV, Flu A&B, Covid) Anterior Nasal Swab     Status: None   Collection Time: 01/18/24  3:53 PM   Specimen: Anterior Nasal Swab  Result Value Ref Range Status   SARS Coronavirus 2 by RT PCR NEGATIVE NEGATIVE Final   Influenza A by PCR NEGATIVE NEGATIVE Final   Influenza B by PCR NEGATIVE NEGATIVE Final    Comment: (NOTE) The Xpert Xpress SARS-CoV-2/FLU/RSV plus assay is intended as an aid in the  diagnosis of influenza from Nasopharyngeal swab specimens and should not be used as a sole basis for treatment. Nasal washings and aspirates are unacceptable for Xpert Xpress SARS-CoV-2/FLU/RSV testing.  Fact Sheet for Patients: BloggerCourse.com  Fact Sheet for Healthcare Providers: SeriousBroker.it  This test is not yet approved or cleared by the [place]  FDA and has been authorized for detection and/or diagnosis of SARS-CoV-2 by FDA under an Emergency Use Authorization (EUA). This EUA will remain in effect (meaning this test can be used) for the duration of the COVID-19 declaration under Section 564(b)(1) of the Act, 21 U.S.C. section 360bbb-3(b)(1), unless the authorization is terminated or revoked.     Resp Syncytial Virus by PCR NEGATIVE NEGATIVE Final    Comment: (NOTE) Fact Sheet for Patients: BloggerCourse.com  Fact Sheet for Healthcare Providers: SeriousBroker.it  This test is not yet approved or cleared by the [place]  FDA and has been authorized for detection  and/or diagnosis of SARS-CoV-2 by FDA under an Emergency Use Authorization (EUA). This EUA will remain in effect (meaning this test can be used) for the duration of the COVID-19 declaration under Section 564(b)(1) of the Act, 21 U.S.C. section 360bbb-3(b)(1), unless the authorization is terminated or revoked.  Performed at [facility], [facility], [address] [place], [place] [address]      Labs: Basic Metabolic Panel: Recent Labs  Lab 01/18/24 0934 01/18/24 0939 01/19/24 1125 01/20/24 0419  NA 139 141 134* 134*  K 4.3 3.9 4.0 4.3  CL 98 98 96* 96*  CO2 30  --  27 28  GLUCOSE 106* 108* 119* 165*  BUN 16 17 14 20   CREATININE 0.87 0.90 0.90 0.82  CALCIUM  8.9  --  8.7* 8.7*  MG  --   --  2.0  --   PHOS  --   --  4.6  --    Liver Function Tests: Recent Labs  Lab 01/19/24 1125  AST 19   ALT 18  ALKPHOS 51  BILITOT 0.8  PROT 6.5  ALBUMIN 3.5   No results for input(s): LIPASE, AMYLASE in the last 168 hours. No results for input(s): AMMONIA in the last 168 hours. CBC: Recent Labs  Lab 01/18/24 0934 01/18/24 0939 01/19/24 1125  WBC 7.0  --  6.5  HGB 13.3 13.9 12.6  HCT 41.2 41.0 39.8  MCV 100.2*  --  100.5*  PLT 191  --  214   Cardiac Enzymes: No results for input(s): CKTOTAL, CKMB, CKMBINDEX, TROPONINI in the last 168 hours. BNP: BNP (last 3 results) Recent Labs    01/18/24 0934  BNP 212.1*    ProBNP (last 3 results) No results for input(s): PROBNP in the last 8760 hours.  CBG: No results for input(s): GLUCAP in the last 168 hours.     Signed:  Sigurd Pac MD.  [facility] 01/20/2024, 10:17 AM

## 2024-01-20 NOTE — Progress Notes (Addendum)
 Patient discharging to SNF.  Per Nichola RN PTAR has not been called yet.   OK to remove PIV.  Dressing intact.  TELE removed CCMD called/ placed at nurse station.   Patient alert orienetd dressed.  Med list updated RN will review and updated as needed.

## 2024-01-20 NOTE — Progress Notes (Signed)
 Called [facility] to give report was unable to get RN, will try again.

## 2024-01-20 NOTE — TOC Transition Note (Signed)
 Transition of Care [facility]) - Discharge Note   Patient Details  Name: Erica Ball MRN: [id number] Date of Birth: 19-Apr-1951  [facility]) CM/SW Contact:  Sherline Clack, LCSWA Phone Number: 01/20/2024, 12:33 PM   Clinical Narrative:     Patient will DC to: [facility] Anticipated DC date: 01/20/24  Family notified: son Transport by: [facility]   Per MD patient ready for DC to [facility]. RN to call report prior to discharge [phone], rm [phone]). RN, patient, patient's family, and facility notified of DC. Discharge Summary and FL2 sent to facility. DC packet on chart. Ambulance transport requested for patient.   CSW will sign off for now as social work intervention is no longer needed. Please consult us  again if new needs arise.     Final next level of care: Skilled Nursing Facility Barriers to Discharge: Barriers Resolved   Patient Goals and CMS Choice            Discharge Placement [facility]     Discharge Plan and Services Additional resources added to the After Visit Summary for                                       Social Drivers of Health (SDOH) Interventions SDOH Screenings   Food Insecurity: No Food Insecurity (01/19/2024)  Housing: Patient Declined (01/19/2024)  Transportation Needs: Patient Declined (01/19/2024)  Utilities: Patient Declined (01/19/2024)  Alcohol Screen: Low Risk  (11/17/2022)  Depression (PHQ2-9): Low Risk  (11/17/2022)  Financial Resource Strain: Patient Unable To Answer (11/17/2022)  Physical Activity: Sufficiently Active (11/17/2022)  Social Connections: Socially Isolated (01/19/2024)  Stress: No Stress Concern Present (11/17/2022)  Tobacco Use: High Risk (01/18/2024)  Health Literacy: Inadequate Health Literacy (11/17/2022)     Readmission Risk Interventions    12/12/2022    4:34 PM  Readmission Risk Prevention Plan  Transportation Screening Complete  PCP or Specialist Appt within 3-5 Days  Complete  HRI or Home Care Consult Complete  Social Work Consult for Recovery Care Planning/Counseling Complete  Palliative Care Screening Not Applicable  Medication Review Oceanographer) Complete

## 2024-01-20 NOTE — [place] FL2 (Signed)
 [place]  MEDICAID FL2 LEVEL OF CARE FORM     IDENTIFICATION  Patient Name: Erica Ball Birthdate: 09-26-1950 Sex: female Admission Date (Current Location): 01/18/2024  [facility] and [place] Number:  Producer, television/film/video and Address:  [facility] [place], [address] [place], [place] [address]      Provider Number: [id number]  Attending Physician Name and Address:  Fairy Frames, MD  Relative Name and Phone Number:       Current Level of Care: Hospital Recommended Level of Care: Skilled Nursing Facility Prior Approval Number:    Date Approved/Denied:   PASRR Number: [id number]  Discharge Plan: SNF    Current Diagnoses: Patient Active Problem List   Diagnosis Date Noted   T4 vertebral fracture (HCC) 01/18/2024   Protein-calorie malnutrition, severe 11/23/2022   Generalized weakness 11/21/2022   SVT (supraventricular tachycardia) (HCC) 11/04/2022   Demand ischemia (HCC) 11/04/2022   Malnutrition of moderate degree 10/31/2022   Tibial plateau fracture 10/24/2022   Hyponatremia 10/24/2022   Hypomagnesemia 10/24/2022   Tibial plateau fracture, left, closed, initial encounter 10/23/2022   Fall at home, initial encounter 10/23/2022   COPD with acute exacerbation (HCC) 12/02/2017   Elevated troponin 07/10/2017   Pleural effusion on right 07/10/2017   UTI (urinary tract infection) 07/10/2017   Sepsis (HCC) 07/10/2017   Lobar pneumonia (HCC) 07/10/2017   Acute kidney injury (HCC) 07/10/2017   Acute respiratory failure with hypoxia (HCC) 07/10/2017   Alcohol abuse    Tobacco abuse 08/20/2014   Hypertension 10/30/2011   Diverticulitis 10/30/2011   Vitamin D  deficiency 10/30/2011    Orientation RESPIRATION BLADDER Height & Weight     Self  O2 (2L/min) Continent Weight: 170 lb (77.1 kg) Height:  5' 8.5 (174 cm)  BEHAVIORAL SYMPTOMS/MOOD NEUROLOGICAL BOWEL NUTRITION STATUS      Continent Diet (please refer to dc summary)  AMBULATORY  STATUS COMMUNICATION OF NEEDS Skin   Limited Assist Verbally Normal                       Personal Care Assistance Level of Assistance  Bathing, Feeding, Dressing Bathing Assistance: Limited assistance Feeding assistance: Limited assistance Dressing Assistance: Limited assistance     Functional Limitations Info  Sight, Hearing, Speech Sight Info: Adequate Hearing Info: Adequate Speech Info: Adequate    SPECIAL CARE FACTORS FREQUENCY  PT (By licensed PT), OT (By licensed OT)     PT Frequency: 5x/week OT Frequency: 5x/week            Contractures Contractures Info: Not present    Additional Factors Info  Code Status, Allergies Code Status Info: Full Allergies Info: Dextromethorphan           Current Medications (01/20/2024):  This is the current hospital active medication list Current Facility-Administered Medications  Medication Dose Route Frequency Provider Last Rate Last Admin   acetaminophen  (TYLENOL ) tablet 650 mg  650 mg Oral Q6H PRN Doutova, Anastassia, MD   650 mg at 01/20/24 1024   Or   acetaminophen  (TYLENOL ) suppository 650 mg  650 mg Rectal Q6H PRN Doutova, Anastassia, MD       albuterol  (PROVENTIL ) (2.5 MG/3ML) 0.083% nebulizer solution 2.5 mg  2.5 mg Nebulization Q4H PRN Joseph, Preetha, MD       aspirin  EC tablet 81 mg  81 mg Oral Daily Doutova, Anastassia, MD   81 mg at 01/20/24 0853   bisacodyl  (DULCOLAX) suppository 10 mg  10 mg Rectal PRN Doutova,  Anastassia, MD       budesonide  (PULMICORT ) nebulizer solution 2 mg  2 mg Nebulization Q12H Doutova, Anastassia, MD   2 mg at 01/19/24 1940   cefUROXime  (CEFTIN ) tablet 250 mg  250 mg Oral BID WC Joseph, Preetha, MD   250 mg at 01/20/24 9145   cyanocobalamin  (VITAMIN B12) tablet 1,000 mcg  1,000 mcg Oral Daily Doutova, Anastassia, MD   1,000 mcg at 01/20/24 0853   enoxaparin  (LOVENOX ) injection 40 mg  40 mg Subcutaneous Q24H Fairy Frames, MD   40 mg at 01/19/24 1135   feeding supplement (ENSURE  ENLIVE / ENSURE PLUS) liquid 237 mL  237 mL Oral BID BM Doutova, Anastassia, MD   237 mL at 01/20/24 0858   fentaNYL  (SUBLIMAZE ) injection 25-50 mcg  25-50 mcg Intravenous Q2H PRN Doutova, Anastassia, MD   25 mcg at 01/19/24 9072   folic acid  (FOLVITE ) tablet 1 mg  1 mg Oral Daily Doutova, Anastassia, MD   1 mg at 01/20/24 0853   guaiFENesin  (MUCINEX ) 12 hr tablet 600 mg  600 mg Oral BID Doutova, Anastassia, MD   600 mg at 01/20/24 0853   hydrALAZINE  (APRESOLINE ) tablet 25 mg  25 mg Oral TID Doutova, Anastassia, MD   25 mg at 01/20/24 0853   HYDROcodone -acetaminophen  (NORCO/VICODIN) 5-325 MG per tablet 1-2 tablet  1-2 tablet Oral Q4H PRN Doutova, Anastassia, MD   2 tablet at 01/19/24 2114   ipratropium-albuterol  (DUONEB) 0.5-2.5 (3) MG/3ML nebulizer solution 3 mL  3 mL Nebulization BID Fairy Frames, MD   3 mL at 01/19/24 1940   magnesium  oxide (MAG-OX) tablet 400 mg  400 mg Oral Daily Doutova, Anastassia, MD   400 mg at 01/20/24 0853   methylPREDNISolone  sodium succinate (SOLU-MEDROL ) 40 mg/mL injection 40 mg  40 mg Intravenous Q12H Joseph, Preetha, MD   40 mg at 01/19/24 2301   ondansetron  (ZOFRAN ) tablet 4 mg  4 mg Oral Q6H PRN Doutova, Anastassia, MD       Or   ondansetron  (ZOFRAN ) injection 4 mg  4 mg Intravenous Q6H PRN Doutova, Anastassia, MD       pantoprazole  (PROTONIX ) EC tablet 40 mg  40 mg Oral Daily Doutova, Anastassia, MD   40 mg at 01/20/24 0853   rosuvastatin  (CRESTOR ) tablet 10 mg  10 mg Oral Daily Doutova, Anastassia, MD   10 mg at 01/20/24 0853   sodium chloride  flush (NS) 0.9 % injection 3 mL  3 mL Intravenous Q12H Doutova, Anastassia, MD   3 mL at 01/20/24 0858   sodium chloride  flush (NS) 0.9 % injection 3 mL  3 mL Intravenous PRN Doutova, Anastassia, MD       sodium chloride  tablet 1 g  1 g Oral BID WC Doutova, Anastassia, MD   1 g at 01/20/24 0853   thiamine  (VITAMIN B1) tablet 100 mg  100 mg Oral Daily Doutova, Anastassia, MD   100 mg at 01/20/24 9146     Discharge  Medications: Please see discharge summary for a list of discharge medications.  Relevant Imaging Results:  Relevant Lab Results:   Additional Information SSN: [id number]  Sherline Clack, LCSWA

## 2024-03-09 ENCOUNTER — Inpatient Hospital Stay (HOSPITAL_COMMUNITY)
Admission: EM | Admit: 2024-03-09 | Discharge: 2024-03-22 | DRG: 516 | Disposition: A | Discharge status: Skilled Nursing Facility | Source: Skilled Nursing Facility | Attending: Emergency Medicine | Admitting: Emergency Medicine

## 2024-03-09 ENCOUNTER — Encounter (HOSPITAL_COMMUNITY): Payer: Self-pay

## 2024-03-09 ENCOUNTER — Emergency Department (HOSPITAL_COMMUNITY)

## 2024-03-09 ENCOUNTER — Other Ambulatory Visit: Payer: Self-pay

## 2024-03-09 DIAGNOSIS — Z604 Social exclusion and rejection: Secondary | ICD-10-CM | POA: Diagnosis present

## 2024-03-09 DIAGNOSIS — K388 Other specified diseases of appendix: Secondary | ICD-10-CM | POA: Diagnosis present

## 2024-03-09 DIAGNOSIS — E785 Hyperlipidemia, unspecified: Secondary | ICD-10-CM | POA: Diagnosis present

## 2024-03-09 DIAGNOSIS — Z01818 Encounter for other preprocedural examination: Secondary | ICD-10-CM

## 2024-03-09 DIAGNOSIS — Z7982 Long term (current) use of aspirin: Secondary | ICD-10-CM

## 2024-03-09 DIAGNOSIS — Z79899 Other long term (current) drug therapy: Secondary | ICD-10-CM

## 2024-03-09 DIAGNOSIS — M62830 Muscle spasm of back: Secondary | ICD-10-CM | POA: Diagnosis present

## 2024-03-09 DIAGNOSIS — Z90710 Acquired absence of both cervix and uterus: Secondary | ICD-10-CM

## 2024-03-09 DIAGNOSIS — K5730 Diverticulosis of large intestine without perforation or abscess without bleeding: Secondary | ICD-10-CM | POA: Diagnosis present

## 2024-03-09 DIAGNOSIS — S32019A Unspecified fracture of first lumbar vertebra, initial encounter for closed fracture: Secondary | ICD-10-CM | POA: Diagnosis not present

## 2024-03-09 DIAGNOSIS — I5032 Chronic diastolic (congestive) heart failure: Secondary | ICD-10-CM | POA: Diagnosis present

## 2024-03-09 DIAGNOSIS — Z9981 Dependence on supplemental oxygen: Secondary | ICD-10-CM

## 2024-03-09 DIAGNOSIS — J449 Chronic obstructive pulmonary disease, unspecified: Secondary | ICD-10-CM | POA: Diagnosis present

## 2024-03-09 DIAGNOSIS — R1903 Right lower quadrant abdominal swelling, mass and lump: Secondary | ICD-10-CM | POA: Diagnosis present

## 2024-03-09 DIAGNOSIS — M4856XA Collapsed vertebra, not elsewhere classified, lumbar region, initial encounter for fracture: Secondary | ICD-10-CM | POA: Diagnosis present

## 2024-03-09 DIAGNOSIS — K8020 Calculus of gallbladder without cholecystitis without obstruction: Secondary | ICD-10-CM | POA: Diagnosis present

## 2024-03-09 DIAGNOSIS — Z791 Long term (current) use of non-steroidal anti-inflammatories (NSAID): Secondary | ICD-10-CM | POA: Diagnosis not present

## 2024-03-09 DIAGNOSIS — F17210 Nicotine dependence, cigarettes, uncomplicated: Secondary | ICD-10-CM | POA: Diagnosis present

## 2024-03-09 DIAGNOSIS — I110 Hypertensive heart disease with heart failure: Secondary | ICD-10-CM | POA: Diagnosis present

## 2024-03-09 DIAGNOSIS — J9611 Chronic respiratory failure with hypoxia: Secondary | ICD-10-CM | POA: Diagnosis present

## 2024-03-09 DIAGNOSIS — Z9889 Other specified postprocedural states: Secondary | ICD-10-CM

## 2024-03-09 DIAGNOSIS — S32000A Wedge compression fracture of unspecified lumbar vertebra, initial encounter for closed fracture: Secondary | ICD-10-CM

## 2024-03-09 DIAGNOSIS — G8929 Other chronic pain: Secondary | ICD-10-CM | POA: Diagnosis present

## 2024-03-09 DIAGNOSIS — J441 Chronic obstructive pulmonary disease with (acute) exacerbation: Secondary | ICD-10-CM

## 2024-03-09 DIAGNOSIS — Z888 Allergy status to other drugs, medicaments and biological substances status: Secondary | ICD-10-CM

## 2024-03-09 DIAGNOSIS — Z8249 Family history of ischemic heart disease and other diseases of the circulatory system: Secondary | ICD-10-CM | POA: Diagnosis not present

## 2024-03-09 DIAGNOSIS — I10 Essential (primary) hypertension: Secondary | ICD-10-CM | POA: Diagnosis not present

## 2024-03-09 DIAGNOSIS — I471 Supraventricular tachycardia, unspecified: Secondary | ICD-10-CM

## 2024-03-09 LAB — BASIC METABOLIC PANEL WITH GFR
Anion gap: 14 (ref 5–15)
BUN: 12 mg/dL (ref 8–23)
CO2: 29 mmol/L (ref 22–32)
Calcium: 8.5 mg/dL — ABNORMAL LOW (ref 8.9–10.3)
Chloride: 96 mmol/L — ABNORMAL LOW (ref 98–111)
Creatinine, Ser: 0.81 mg/dL (ref 0.44–1.00)
GFR, Estimated: >60 mL/min (ref >60)
Glucose, Bld: 138 mg/dL — ABNORMAL HIGH (ref 70–99)
Potassium: 3.9 mmol/L (ref 3.5–5.1)
Sodium: 139 mmol/L (ref 135–145)

## 2024-03-09 LAB — CBC
HCT: 44.5 % (ref 36.0–46.0)
Hemoglobin: 14.4 g/dL (ref 12.0–15.0)
MCH: 31.6 pg (ref 26.0–34.0)
MCHC: 32.4 g/dL (ref 30.0–36.0)
MCV: 97.6 fL (ref 80.0–100.0)
Platelets: 266 K/uL (ref 150–400)
RBC: 4.56 MIL/uL (ref 3.87–5.11)
RDW: 12.5 % (ref 11.5–15.5)
WBC: 12.4 K/uL — ABNORMAL HIGH (ref 4.0–10.5)
nRBC: 0 % (ref 0.0–0.2)

## 2024-03-09 MED ORDER — KETOROLAC TROMETHAMINE 15 MG/ML IJ SOLN
15.0000 mg | Freq: Once | INTRAMUSCULAR | Status: AC
  Administered 2024-03-09: 15 mg via INTRAVENOUS
  Filled 2024-03-09: qty 1

## 2024-03-09 MED ORDER — IOHEXOL 350 MG/ML SOLN
75.0000 mL | Freq: Once | INTRAVENOUS | Status: AC | PRN
  Administered 2024-03-09: 75 mL via INTRAVENOUS

## 2024-03-09 MED ORDER — FENTANYL CITRATE (PF) 50 MCG/ML IJ SOSY
50.0000 ug | PREFILLED_SYRINGE | Freq: Once | INTRAMUSCULAR | Status: AC
  Administered 2024-03-09: 50 ug via INTRAVENOUS
  Filled 2024-03-09: qty 1

## 2024-03-09 MED ORDER — POLYETHYLENE GLYCOL 3350 17 G PO PACK
17.0000 g | PACK | Freq: Every day | ORAL | Status: DC | PRN
  Administered 2024-03-11: 17 g via ORAL
  Filled 2024-03-09: qty 1

## 2024-03-09 MED ORDER — HEPARIN SODIUM (PORCINE) 5000 UNIT/ML IJ SOLN
5000.0000 [IU] | Freq: Three times a day (TID) | INTRAMUSCULAR | Status: DC
  Administered 2024-03-10 – 2024-03-22 (×34): 5000 [IU] via SUBCUTANEOUS
  Filled 2024-03-09 (×35): qty 1

## 2024-03-09 MED ORDER — PROCHLORPERAZINE EDISYLATE 10 MG/2ML IJ SOLN: 5.0000 mg | Freq: Four times a day (QID) | INTRAMUSCULAR | Status: DC | PRN

## 2024-03-09 MED ORDER — MELATONIN 5 MG PO TABS
5.0000 mg | ORAL_TABLET | Freq: Every evening | ORAL | Status: DC | PRN
  Administered 2024-03-10 – 2024-03-20 (×9): 5 mg via ORAL
  Filled 2024-03-09 (×9): qty 1

## 2024-03-09 MED ORDER — ACETAMINOPHEN 500 MG PO TABS
500.0000 mg | ORAL_TABLET | Freq: Four times a day (QID) | ORAL | Status: DC | PRN
  Administered 2024-03-12: 500 mg via ORAL
  Filled 2024-03-09: qty 1

## 2024-03-09 MED ORDER — HYDROMORPHONE HCL 1 MG/ML IJ SOLN
0.5000 mg | INTRAMUSCULAR | Status: AC
  Administered 2024-03-09: 0.5 mg via INTRAVENOUS
  Filled 2024-03-09: qty 1

## 2024-03-09 MED ORDER — HYDROMORPHONE HCL 1 MG/ML IJ SOLN
0.5000 mg | INTRAMUSCULAR | Status: AC | PRN
  Administered 2024-03-10 (×5): 0.5 mg via INTRAVENOUS
  Filled 2024-03-09 (×5): qty 0.5

## 2024-03-09 MED ORDER — OXYCODONE HCL 5 MG PO TABS
5.0000 mg | ORAL_TABLET | Freq: Four times a day (QID) | ORAL | Status: AC | PRN
  Administered 2024-03-10 – 2024-03-11 (×5): 5 mg via ORAL
  Filled 2024-03-09 (×5): qty 1

## 2024-03-09 MED ORDER — OXYCODONE-ACETAMINOPHEN 5-325 MG PO TABS
1.0000 | ORAL_TABLET | Freq: Once | ORAL | Status: DC
  Filled 2024-03-09: qty 1

## 2024-03-09 NOTE — ED Triage Notes (Signed)
 Pt BIB [facility] from [facility] SNF d/t lower back spasms, denies tingling/numbness or falls. Does report Tramadol  has helped some but not all pain. Was able to stand/walk to stretcher for EMS. A/Ox4, 180/110, 76 bpm, 87% on RA & 93% on 2L via n/c.

## 2024-03-09 NOTE — ED Provider Triage Note (Signed)
 Emergency Medicine Provider Triage Evaluation Note  Erica Ball , a 73 y.o. female  was evaluated in triage.  Pt complains of low back pain.  Review of Systems  Positive: Low back pain Negative: Fever  Physical Exam  BP (!) 182/91 (BP Location: Left Arm)   Pulse 79   Temp 98.4 F (36.9 C)   Resp 18   Ht 5' 8.5 (1.74 m)   Wt 83.9 kg   SpO2 91%   BMI 27.72 kg/m  Bar tenderness  Medical Decision Making  Medically screening exam initiated at 1:38 PM.  Appropriate orders placed.  Erica Ball was informed that the remainder of the evaluation will be completed by another provider, this initial triage assessment does not replace that evaluation, and the importance of remaining in the ED until their evaluation is complete.  Atraumatic low back pain.  States spasms.  High risk for fracture.  Will treat symptomatically get imaging and basic blood work.   Erica Lot, MD 03/09/24 [phone]

## 2024-03-09 NOTE — ED Provider Notes (Signed)
  Physical Exam  BP (!) 156/91   Pulse 83   Temp 98.2 F (36.8 C) (Oral)   Resp 18   Ht 5' 8.5 (1.74 m)   Wt 83.9 kg   SpO2 94%   BMI 27.72 kg/m   Physical Exam  Procedures  Procedures  ED Course / MDM   Clinical Course as of 03/09/24 1856  Mon Mar 09, 2024  1856 WBC(!): 12.4 [JL]    Clinical Course User Index [JL] Jerrol Agent, MD   Medical Decision Making Amount and/or Complexity of Data Reviewed Labs: ordered. Decision-making details documented in ED Course. Radiology: ordered.  Risk Prescription drug management. Decision regarding hospitalization.   66F, hx of T4 compression fx, presenting with spasm and pain. Likely L1 compression fx, poss subacute fx, RLQ fluid filled collection, waiting on CT Abd Pelvis to evaluate. On O2 chronically.     CT Abd Pel: IMPRESSION:  1. 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 No acute inflammatory changes. Surgical consultation and  excision with pathologic analysis is recommended to exclude  neoplasm.  2. Cholelithiasis without cholecystitis.  3. Distal colonic diverticulosis without diverticulitis.  4. L1 compression fracture. Please see preceding CT lumbar spine  report for complete lumbar spine findings.  5.  Aortic Atherosclerosis (ICD10-I70.0).     General Surgery:  Spoke with Dr. Rubin, will see in consultation. Pt has focal TTP of the RLQ, remains in pain and is tender on exam.   Dr. Rubin evaluated the patient and recommended admission and to the medicine service.  Did not recommend antibiotics, general surgery will evaluate the patient in the morning for further inpatient management.  Hospitalist medicine consulted for admission, Dr. Shona accepting.   Jerrol Agent, MD 03/09/24 2125

## 2024-03-09 NOTE — ED Notes (Signed)
 Patient placed in room. Attempted to approach to collect blood. Patient was being taken to CT scanner at this time.

## 2024-03-09 NOTE — ED Provider Notes (Signed)
 [place] [facility]ERGENC[facility] DEPARTMENT AT [facility] Provider Note   CSN: [id number] Arrival date & time: 03/09/24  1309     Patient presents with: Back Pain   Erica Ball is a 73 y.o. female.    Back Pain Patient presents with low back pain.  Woke with this morning.  Mid lower back and feels like spasms.  No new injury.  Worse with certain movements.  No fevers.  No lightheadedness or dizziness.  Does have a history of COPD and has oxygen as needed at home.    Past Medical History:  Diagnosis Date   Acute kidney injury 07/10/2017   Acute respiratory failure with hypoxia (HCC) 07/10/2017   Alcohol abuse    Allergy    Cataract    Diverticulitis 10/30/2011   Elevated troponin 07/10/2017   Hypertension    Lobar pneumonia 07/10/2017   Pleural effusion on right 07/10/2017   Sepsis (HCC) 07/10/2017   SVT (supraventricular tachycardia)    Tobacco abuse    UTI (urinary tract infection) 07/10/2017   Vitamin D  deficiency 10/30/2011   Past Surgical History:  Procedure Laterality Date   ABDOMINAL HYSTERECTOMY     CATARACT EXTRACTION     FRACTURE SURGERY     IR THORACENTESIS ASP PLEURAL SPACE W/IMG GUIDE  07/11/2017   VIDEO ASSISTED THORACOSCOPY (VATS)/EMPYEMA Right 07/12/2017   Procedure: right VIDEO ASSISTED THORACOSCOPY (VATS) for drainage of EMPYEMA and decortication;  Surgeon: Kerrin Elspeth BROCKS, MD;  Location: [facility] OR;  Service: Thoracic;  Laterality: Right;   VIDEO BRONCHOSCOPY N/A 07/12/2017   Procedure: VIDEO BRONCHOSCOPY;  Surgeon: Kerrin Elspeth BROCKS, MD;  Location: [facility] OR;  Service: Thoracic;  Laterality: N/A;     Prior to Admission medications   Medication Sig Start Date End Date Taking? Authorizing Provider  acetaminophen  (TYLENOL ) 325 MG tablet Take 650 mg by mouth every 6 (six) hours as needed (general discomfort).    [provider]  albuterol  (PROVENTIL  HFA;VENTOLIN  HFA) 108 (90 Base) MCG/ACT inhaler Inhale 1-2 puffs into the lungs every 4  (four) hours as needed for shortness of breath. 07/10/17   [provider]  amLODipine  (NORVASC ) 5 MG tablet Take 5 mg by mouth daily.    [provider]  aspirin  EC 81 MG tablet Take 1 tablet (81 mg total) by mouth daily. 12/06/17   Nishan, Peter C, MD  bisacodyl  (DULCOLAX) 10 MG suppository Place 10 mg rectally daily as needed (constipation).    [provider]  calcium  carbonate (OSCAL) 1500 (600 Ca) MG TABS tablet Take 600 mg of elemental calcium  by mouth daily.    [provider]  cyanocobalamin  1000 MCG tablet Take 1 tablet (1,000 mcg total) by mouth daily. 11/28/22   Leotis Bogus, MD  folic acid  (FOLVITE ) 1 MG tablet Take 1 tablet (1 mg total) by mouth daily. 11/05/22   Patel, Poonamkumari J, MD  hydrALAZINE  (APRESOLINE ) 25 MG tablet Take 1 tablet (25 mg total) by mouth 3 (three) times daily. 01/20/24   Fairy Frames, MD  magnesium  oxide (MAG-OX) 400 (240 Mg) MG tablet Take 1 tablet (400 mg total) by mouth daily. 11/29/22   Leotis Bogus, MD  meloxicam (MOBIC) 15 MG tablet Take 15 mg by mouth daily.    [provider]  Menthol, Topical Analgesic, 4 % GEL Apply 1 application  topically See admin instructions. Apply to joints topically as needed for pain. Apply to joints PRN - Therapist only.    [provider]  metoprolol  succinate (TOPROL   XL) 100 MG 24 hr tablet Take 1 tablet (100 mg total) by mouth daily. Take with or immediately following a meal. 11/04/22 03/14/24  Patel, Poonamkumari J, MD  metoprolol  tartrate (LOPRESSOR ) 25 MG tablet Take 25 mg by mouth daily at 6 PM.    [provider]  ondansetron  (ZOFRAN ) 4 MG tablet Take 1 tablet (4 mg total) by mouth every 6 (six) hours as needed for nausea. 12/17/22   Regalado, Belkys A, MD  pantoprazole  (PROTONIX ) 40 MG tablet Take 1 tablet (40 mg total) by mouth daily. 11/29/22   Leotis Bogus, MD  promethazine -dextromethorphan (PROMETHAZINE -DM) 6.25-15 MG/5ML syrup Take 5 mLs by mouth  every 6 (six) hours as needed for cough.    [provider]  rosuvastatin  (CRESTOR ) 10 MG tablet Take 1 tablet (10 mg total) by mouth daily. Patient taking differently: Take 10 mg by mouth at bedtime. 11/05/22   Patel, Poonamkumari J, MD  sodium chloride  1 g tablet Take 1-2 g by mouth See admin instructions. Give 2 tablets (2g) by mouth twice daily (0800 and 1600) and give 1 tablet (1g) at 1400.    [provider]  thiamine  (VITAMIN B-1) 100 MG tablet Take 1 tablet (100 mg total) by mouth daily. 11/05/22   Patel, Poonamkumari J, MD  torsemide (DEMADEX) 20 MG tablet Take 20 mg by mouth daily.    [provider]    Allergies: Dextromethorphan    Review of Systems  Musculoskeletal:  Positive for back pain.    Updated Vital Signs BP (!) 156/74   Pulse 70   Temp 98.3 F (36.8 C) (Oral)   Resp 20   Ht 5' 8.5 (1.74 m)   Wt 83.9 kg   SpO2 98%   BMI 27.72 kg/m   Physical Exam Vitals and nursing note reviewed.  Chest:     Chest wall: No tenderness.  Abdominal:     Tenderness: There is no abdominal tenderness.  Musculoskeletal:     Comments: Tenderness over the lumbar spine.  No step-off or deformity.  Does have some pain with straight leg raise bilaterally.  Skin:    Capillary Refill: Capillary refill takes less than 2 seconds.  Neurological:     Mental Status: She is alert and oriented to person, place, and time.     (all labs ordered are listed, but only abnormal results are displayed) Labs Reviewed  BASIC METABOLIC PANEL WITH GFR - Abnormal; Notable for the following components:      Result Value   Chloride 96 (*)    Glucose, Bld 138 (*)    Calcium  8.5 (*)    All other components within normal limits  CBC - Abnormal; Notable for the following components:   WBC 12.4 (*)    All other components within normal limits    EKG: None  Radiology: CT Lumbar Spine Wo Contrast Result Date: 03/09/2024 EXAM: CT OF THE LUMBAR SPINE WITHOUT CONTRAST  03/09/2024 02:42:00 PM TECHNIQUE: CT of the lumbar spine was performed without the administration of intravenous contrast. Multiplanar reformatted images are provided for review. Automated exposure control, iterative reconstruction, and/or weight based adjustment of the mA/kV was utilized to reduce the radiation dose to as low as reasonably achievable. COMPARISON: Lumbar spine radiographs 06/06/2014. CLINICAL HISTORY: Low back pain, increased fracture risk. FINDINGS: BONES AND ALIGNMENT: Likely subacute 45 percent compression fracture along the superior endplate at L1 with 6 mm posterior bony aortic pulsion and mild sclerosis along the superior endplate compression. Anterior and middle column  involvement noted with no posterior column involvement identified. Left anterior flattening of the superior endplate of L4 not changed from 06/06/2014 suggesting a remote subtle L4 compression. Bony demineralization. Normal alignment. DEGENERATIVE CHANGES: Mild disc bulge is present at L3-L4, L4-L5, and L5-S1 without impingement. SOFT TISSUES: No significant paraspinal edema. LUNGS: Scarring or subsegmental atelectasis in both lower lobes. VASCULATURE: Systemic atherosclerosis is present, including the aorta and iliac arteries. GALLBLADDER: Cholelithiasis. KIDNEYS, URETERS, AND BLADDER: 0.7 cm Bosniak category 2 benign but complex cyst of the left kidney upper pole with internal density 96 Hounsfield units on image 17 series 7. No further imaging workup of this lesion is indicated. STOMACH AND BOWEL: Sigmoid colon diverticulosis. Abnormal fluid density lesion with rim calcification measuring at least 5.7 cm in diameter in the right lower quadrant near the cecum on image 89 series 7, only partially included on today's exam. The appearance is nonspecific and could include mass, Meckel diverticulum, appendiceal mucocele, duplication cyst, or other abnormalities. Dedicated CT of the abdomen and pelvis with contrast is recommended  for better assessment. IMPRESSION: 1. Likely subacute 45% compression fracture along the superior endplate at L1 with 6 mm posterior bony retropulsion and mild sclerosis, involving anterior and middle columns without posterior column involvement. 2. Abnormal rim-calcified fluid-density lesion at least 5.7 cm in diameter in the right lower quadrant near the cecum, only partially included; recommend dedicated contrast-enhanced CT of the abdomen and pelvis for further assessment. 3. Left anterior flattening of the superior endplate of L4, unchanged from 06/06/14, suggesting a remote subtle L4 compression. 4. Mild disc bulge at L3-L4, L4-L5, and L5-S1 without impingement. 5. Atherosclerosis. 6. Cholelithiasis. Electronically signed by: Ryan Salvage MD 03/09/2024 03:00 PM EST RP Workstation: [id number]     Procedures   Medications Ordered in the ED  fentaNYL  (SUBLIMAZE ) injection 50 mcg (50 mcg Intravenous Given 03/09/24 1511)  ketorolac  (TORADOL ) 15 MG/ML injection 15 mg (15 mg Intravenous Given 03/09/24 1510)                                    Medical Decision Making Amount and/or Complexity of Data Reviewed Labs: ordered. Radiology: ordered.  Risk Prescription drug management.   Patient with low back pain.  Differential diagnose includes musculoskeletal pain, fractures, spasms.  Reviewed previous notes and did have T4 compression fraction.  CT done to evaluate causes such as new compression fractures.  Does show likely L1 compression fracture.  I think this is likely the cause of the pain.  Does have age-indeterminate L4 compression fracture do it was thought to be old however.  Blood work reassuring.  White count is mildly elevated.  However the CT of the lumbar spine did show potential right lower quadrant mass.  Will get CT scan as recommended to further evaluate.  Care turned over to Dr. Jerrol     Final diagnoses:  Lumbar compression fracture, closed, initial encounter [facility])     ED Discharge Orders     None          Patsey Lot, MD 03/09/24 1559

## 2024-03-09 NOTE — H&P (Signed)
 History and Physical  Erica Ball FMW:[id number] DOB: 1951/04/09 DOA: 03/09/2024  Referring physician: Dr. Jerrol, EDP  PCP: Macel Erica PARAS, MD  Outpatient Specialists: Neurology, cardiology, pulmonary. Patient coming from: SNF.  Chief Complaint: Low back pain and spasm  HPI: Erica Ball is a 73 y.o. female with medical history significant for COPD, empyema requiring VATS, chronic hypoxia on 2 L nasal cannula as needed, SVT, tobacco abuse, hypertension, who presents to the ER from SNF due to low back pain and back spasms.  Denies any falls or trauma.  EMS was activated.  Upon EMS arrival, the patient was hypoxic with O2 saturation in the 80s on room air, improved on 2 L nasal cannula.  Blood pressure was significantly elevated 180/110.  In the ER, she had a CT abdomen and pelvis which showed L1 compression fracture and incidental findings of lobular peripherally calcified cystic mass in the right lower quadrant, likely of appendiceal origin, most compatible with mucocele.  No acute inflammatory changes.  Surgical consultation and excision with pathology analysis is recommended to exclude neoplasm.  EDP discussed the case with general surgery who saw the patient in consultation.  Did not recommend antibiotics at this time.  Recommended admission by the medicine team.  Admitted by [facility], hospitalist service.  The patient received several rounds of IV opiate-based analgesics with improvement of her pain.  ED Course: Temperature 98.2.  BP 137/73, pulse 62, respiration rate 18, O2 saturation 99% on room air.  Review of Systems: Review of systems as noted in the HPI. All other systems reviewed and are negative.   Past Medical History:  Diagnosis Date   Acute kidney injury 07/10/2017   Acute respiratory failure with hypoxia (HCC) 07/10/2017   Alcohol abuse    Allergy    Cataract    Diverticulitis 10/30/2011   Elevated troponin 07/10/2017   Hypertension    Lobar pneumonia  07/10/2017   Pleural effusion on right 07/10/2017   Sepsis (HCC) 07/10/2017   SVT (supraventricular tachycardia)    Tobacco abuse    UTI (urinary tract infection) 07/10/2017   Vitamin D  deficiency 10/30/2011   Past Surgical History:  Procedure Laterality Date   ABDOMINAL HYSTERECTOMY     CATARACT EXTRACTION     FRACTURE SURGERY     IR THORACENTESIS ASP PLEURAL SPACE W/IMG GUIDE  07/11/2017   VIDEO ASSISTED THORACOSCOPY (VATS)/EMPYEMA Right 07/12/2017   Procedure: right VIDEO ASSISTED THORACOSCOPY (VATS) for drainage of EMPYEMA and decortication;  Surgeon: Kerrin Elspeth BROCKS, MD;  Location: [facility] OR;  Service: Thoracic;  Laterality: Right;   VIDEO BRONCHOSCOPY N/A 07/12/2017   Procedure: VIDEO BRONCHOSCOPY;  Surgeon: Kerrin Elspeth BROCKS, MD;  Location: [facility] OR;  Service: Thoracic;  Laterality: N/A;    Social History:  reports that she has been smoking cigarettes. She has a 79.5 pack-year smoking history. She has never used smokeless tobacco. She reports current alcohol use of about 21.0 standard drinks of alcohol per week. She reports that she does not use drugs.   Allergies  Allergen Reactions   Dextromethorphan Other (See Comments)    Severe aggression     Family History  Problem Relation Age of Onset   Hypertension Mother    Heart disease Mother    Hypertension Father    Heart disease Father    Stroke Brother    Hypertension Brother    Heart disease Brother       Prior to Admission medications   Medication Sig Start Date End Date Taking?  Authorizing Provider  acetaminophen  (TYLENOL ) 325 MG tablet Take 650 mg by mouth every 6 (six) hours as needed (general discomfort).    [provider]  albuterol  (PROVENTIL  HFA;VENTOLIN  HFA) 108 (90 Base) MCG/ACT inhaler Inhale 1-2 puffs into the lungs every 4 (four) hours as needed for shortness of breath. 07/10/17   [provider]  amLODipine  (NORVASC ) 5 MG tablet Take 5 mg by mouth daily.    [provider]   aspirin  EC 81 MG tablet Take 1 tablet (81 mg total) by mouth daily. 12/06/17   Nishan, Erica C, MD  bisacodyl  (DULCOLAX) 10 MG suppository Place 10 mg rectally daily as needed (constipation).    [provider]  calcium  carbonate (OSCAL) 1500 (600 Ca) MG TABS tablet Take 600 mg of elemental calcium  by mouth daily.    [provider]  cyanocobalamin  1000 MCG tablet Take 1 tablet (1,000 mcg total) by mouth daily. 11/28/22   Leotis Bogus, MD  folic acid  (FOLVITE ) 1 MG tablet Take 1 tablet (1 mg total) by mouth daily. 11/05/22   Patel, Erica J, MD  hydrALAZINE  (APRESOLINE ) 25 MG tablet Take 1 tablet (25 mg total) by mouth 3 (three) times daily. 01/20/24   Fairy Frames, MD  magnesium  oxide (MAG-OX) 400 (240 Mg) MG tablet Take 1 tablet (400 mg total) by mouth daily. 11/29/22   Leotis Bogus, MD  meloxicam (MOBIC) 15 MG tablet Take 15 mg by mouth daily.    [provider]  Menthol, Topical Analgesic, 4 % GEL Apply 1 application  topically See admin instructions. Apply to joints topically as needed for pain. Apply to joints PRN - Therapist only.    [provider]  metoprolol  succinate (TOPROL  XL) 100 MG 24 hr tablet Take 1 tablet (100 mg total) by mouth daily. Take with or immediately following a meal. 11/04/22 03/14/24  Erica Ball PARAS, MD  metoprolol  tartrate (LOPRESSOR ) 25 MG tablet Take 25 mg by mouth daily at 6 PM.    [provider]  ondansetron  (ZOFRAN ) 4 MG tablet Take 1 tablet (4 mg total) by mouth every 6 (six) hours as needed for nausea. 12/17/22   Regalado, Belkys A, MD  pantoprazole  (PROTONIX ) 40 MG tablet Take 1 tablet (40 mg total) by mouth daily. 11/29/22   Leotis Bogus, MD  promethazine -dextromethorphan (PROMETHAZINE -DM) 6.25-15 MG/5ML syrup Take 5 mLs by mouth every 6 (six) hours as needed for cough.    [provider]  rosuvastatin  (CRESTOR ) 10 MG tablet Take 1 tablet (10 mg total) by mouth daily. Patient taking  differently: Take 10 mg by mouth at bedtime. 11/05/22   Patel, Erica J, MD  sodium chloride  1 g tablet Take 1-2 g by mouth See admin instructions. Give 2 tablets (2g) by mouth twice daily (0800 and 1600) and give 1 tablet (1g) at 1400.    [provider]  thiamine  (VITAMIN B-1) 100 MG tablet Take 1 tablet (100 mg total) by mouth daily. 11/05/22   Patel, Erica J, MD  torsemide (DEMADEX) 20 MG tablet Take 20 mg by mouth daily.    [provider]    Physical Exam: BP (!) 156/91   Pulse 83   Temp 98.2 F (36.8 C) (Oral)   Resp 18   Ht 5' 8.5 (1.74 m)   Wt 83.9 kg   SpO2 94%   BMI 27.72 kg/m   General: 73 y.o. year-old female well developed well nourished in no acute distress.  Alert and oriented x3. Cardiovascular: Regular rate  and rhythm with no rubs or gallops.  No thyromegaly or JVD noted.  No lower extremity edema. 2/4 pulses in all 4 extremities. Respiratory: Clear to auscultation with no wheezes or rales. Good inspiratory effort. Abdomen: Soft, right lower quadrant tenderness with palpation.  Nondistended with normal bowel sounds x4 quadrants. Muskuloskeletal: No cyanosis, clubbing or edema noted bilaterally Neuro: CN II-XII intact, strength, sensation, reflexes Skin: No ulcerative lesions noted or rashes Psychiatry: Judgement and insight appear normal. Mood is appropriate for condition and setting          Labs on Admission:  Basic Metabolic Panel: Recent Labs  Lab 03/09/24 1339  NA 139  K 3.9  CL 96*  CO2 29  GLUCOSE 138*  BUN 12  CREATININE 0.81  CALCIUM  8.5*   Liver Function Tests: No results for input(s): AST, ALT, ALKPHOS, BILITOT, PROT, ALBUMIN in the last 168 hours. No results for input(s): LIPASE, AMYLASE in the last 168 hours. No results for input(s): AMMONIA in the last 168 hours. CBC: Recent Labs  Lab 03/09/24 1339  WBC 12.4*  HGB 14.4  HCT 44.5  MCV 97.6  PLT 266   Cardiac Enzymes: No results for  input(s): CKTOTAL, CKMB, CKMBINDEX, TROPONINI in the last 168 hours.  BNP (last 3 results) Recent Labs    01/18/24 0934  BNP 212.1*    ProBNP (last 3 results) No results for input(s): PROBNP in the last 8760 hours.  CBG: No results for input(s): GLUCAP in the last 168 hours.  Radiological Exams on Admission: CT ABDOMEN PELVIS W CONTRAST Result Date: 03/09/2024 CLINICAL DATA:  Right lower quadrant mass on earlier lumbar spine CT, low back pain EXAM: CT ABDOMEN AND PELVIS WITH CONTRAST TECHNIQUE: Multidetector CT imaging of the abdomen and pelvis was performed using the standard protocol following bolus administration of intravenous contrast. RADIATION DOSE REDUCTION: This exam was performed according to the departmental dose-optimization program which includes automated exposure control, adjustment of the mA and/or kV according to patient size and/or use of iterative reconstruction technique. CONTRAST:  75mL OMNIPAQUE  IOHEXOL  350 MG/ML SOLN COMPARISON:  03/09/2024 FINDINGS: Lower chest: No acute pleural or parenchymal lung disease. Hepatobiliary: Dependent calcified gallstone within the gallbladder without cholecystitis. Liver is unremarkable. No biliary duct dilation or choledocholithiasis. Pancreas: Unremarkable. No pancreatic ductal dilatation or surrounding inflammatory changes. Spleen: Normal in size without focal abnormality. Adrenals/Urinary Tract: Multiple subcentimeter renal cortical cysts do not require specific imaging follow-up. No urinary tract calculi or obstructive uropathy within either kidney. Nodular thickening of the left adrenal gland measuring 1.1 cm, measuring 110 HU on this study and 30 HU on the preceding unenhanced CT lumbar spine exam, likely adenoma. Right adrenal is unremarkable. The bladder is grossly normal. Stomach/Bowel: No bowel obstruction or ileus. There is a lobular 6.1 x 4.7 x 5.1 cm peripherally calcified cystic mass in the right lower quadrant, which  appears contiguous with the appendix. Findings are concerning for underlying mucocele. There are no acute inflammatory changes identified. Surgical consultation is recommended. Diffuse diverticulosis throughout the distal colon without evidence of acute diverticulitis. Vascular/Lymphatic: Aortic atherosclerosis. No enlarged abdominal or pelvic lymph nodes. Reproductive: Status post hysterectomy. No adnexal masses. Other: No free fluid or free intraperitoneal gas. No abdominal wall hernia. Musculoskeletal: No acute or destructive bony abnormalities. L1 compression deformity is again noted, please see preceding CT lumbar spine for full description of lumbar spine findings. Reconstructed images demonstrate no additional findings. IMPRESSION: 1. Lobular peripherally calcified cystic mass in the right lower quadrant, likely of  appendiceal origin, most compatible with mucocele. No acute inflammatory changes. Surgical consultation and excision with pathologic analysis is recommended to exclude neoplasm. 2. Cholelithiasis without cholecystitis. 3. Distal colonic diverticulosis without diverticulitis. 4. L1 compression fracture. Please see preceding CT lumbar spine report for complete lumbar spine findings. 5.  Aortic Atherosclerosis (ICD10-I70.0). Electronically Signed   By: Ozell Daring M.D.   On: 03/09/2024 18:22   CT Lumbar Spine Wo Contrast Result Date: 03/09/2024 EXAM: CT OF THE LUMBAR SPINE WITHOUT CONTRAST 03/09/2024 02:42:00 PM TECHNIQUE: CT of the lumbar spine was performed without the administration of intravenous contrast. Multiplanar reformatted images are provided for review. Automated exposure control, iterative reconstruction, and/or weight based adjustment of the mA/kV was utilized to reduce the radiation dose to as low as reasonably achievable. COMPARISON: Lumbar spine radiographs 06/06/2014. CLINICAL HISTORY: Low back pain, increased fracture risk. FINDINGS: BONES AND ALIGNMENT: Likely subacute 45  percent compression fracture along the superior endplate at L1 with 6 mm posterior bony aortic pulsion and mild sclerosis along the superior endplate compression. Anterior and middle column involvement noted with no posterior column involvement identified. Left anterior flattening of the superior endplate of L4 not changed from 06/06/2014 suggesting a remote subtle L4 compression. Bony demineralization. Normal alignment. DEGENERATIVE CHANGES: Mild disc bulge is present at L3-L4, L4-L5, and L5-S1 without impingement. SOFT TISSUES: No significant paraspinal edema. LUNGS: Scarring or subsegmental atelectasis in both lower lobes. VASCULATURE: Systemic atherosclerosis is present, including the aorta and iliac arteries. GALLBLADDER: Cholelithiasis. KIDNEYS, URETERS, AND BLADDER: 0.7 cm Bosniak category 2 benign but complex cyst of the left kidney upper pole with internal density 96 Hounsfield units on image 17 series 7. No further imaging workup of this lesion is indicated. STOMACH AND BOWEL: Sigmoid colon diverticulosis. Abnormal fluid density lesion with rim calcification measuring at least 5.7 cm in diameter in the right lower quadrant near the cecum on image 89 series 7, only partially included on today's exam. The appearance is nonspecific and could include mass, Meckel diverticulum, appendiceal mucocele, duplication cyst, or other abnormalities. Dedicated CT of the abdomen and pelvis with contrast is recommended for better assessment. IMPRESSION: 1. Likely subacute 45% compression fracture along the superior endplate at L1 with 6 mm posterior bony retropulsion and mild sclerosis, involving anterior and middle columns without posterior column involvement. 2. Abnormal rim-calcified fluid-density lesion at least 5.7 cm in diameter in the right lower quadrant near the cecum, only partially included; recommend dedicated contrast-enhanced CT of the abdomen and pelvis for further assessment. 3. Left anterior flattening of  the superior endplate of L4, unchanged from 06/06/14, suggesting a remote subtle L4 compression. 4. Mild disc bulge at L3-L4, L4-L5, and L5-S1 without impingement. 5. Atherosclerosis. 6. Cholelithiasis. Electronically signed by: Ryan Salvage MD 03/09/2024 03:00 PM EST RP Workstation: [id number]    EKG: I independently viewed the EKG done and my findings are as followed: None available at the time of this visit.  Assessment/Plan Present on Admission:  Mass of appendix  Principal Problem:   Mass of appendix  Mass of appendix, POA Incidental findings of lobular peripherally calcified cystic mass in the right lower quadrant, likely of appendiceal origin, most compatible with mucocele.  No acute inflammatory changes.  Surgical consultation and excision with pathology analysis is recommended to exclude neoplasm. General surgery, Dr. Rubin, saw the patient in consultation.  Continue as needed analgesics As needed IV antiemetics Further management per general surgery.  L1 compression fracture, atraumatic, POA Suspect osteoporosis As needed analgesics PT OT evaluation  Fall precautions. Outpatient follow-up for DEXA scan and possibly evaluate for secondary causes.  Chronic hypoxic respiratory failure On 2 L nasal cannula as needed prior to admission Maintain a saturation above 90%. DuoNebs as needed for shortness of breath and wheezing  Chronic HFpEF Euvolemic on exam Last 2D echo done on 10/30/2022 revealed LVEF 60-65% with grade 1 diastolic dysfunction. Monitor strict I's and O's and daily weight.   Time: 75 minutes.   DVT prophylaxis: Subcu heparin 3 times daily  Code Status: Full code  Family Communication: None at bedside  Disposition Plan: Admitted to MedSurg unit.  Consults called: General Surgery.  Admission status: Inpatient status.   Status is: Inpatient The patient requires at least 2 midnights for further evaluation and treatment of present  condition.   Terry LOISE Hurst MD [facility] Pager [phone]  If 7PM-7AM, please contact night-coverage [email or web address] Password [facility]  03/09/2024, 9:49 PM

## 2024-03-09 NOTE — Consult Note (Signed)
 Reason for Consult: Appendiceal calcification Referring Physician: Dr. Jerrol Sauer [name] is an 73 y.o. female.  HPI: Patient is a 73 year old female with a history of acute respiratory failure, hypoxia, home O2, hypertension, who comes in secondary to lower back pain.  States that has been going on for several days.  Patient underwent CT scan of her lower L-spine and on that scan was found to have a cystic mass near the appendix.  Patient underwent dedicated CT scan of her abdomen pelvis.  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  Patient is unsure of any previous colonoscopies in the past.  She does state that she had a was unsure when.  I do not see any colonoscopies in the [facility] system.  Patient denies any previous abdominal surgery aside from hysterectomy.  Past Medical History:  Diagnosis Date   Acute kidney injury 07/10/2017   Acute respiratory failure with hypoxia (HCC) 07/10/2017   Alcohol abuse    Allergy    Cataract    Diverticulitis 10/30/2011   Elevated troponin 07/10/2017   Hypertension    Lobar pneumonia 07/10/2017   Pleural effusion on right 07/10/2017   Sepsis (HCC) 07/10/2017   SVT (supraventricular tachycardia)    Tobacco abuse    UTI (urinary tract infection) 07/10/2017   Vitamin D  deficiency 10/30/2011    Past Surgical History:  Procedure Laterality Date   ABDOMINAL HYSTERECTOMY     CATARACT EXTRACTION     FRACTURE SURGERY     IR THORACENTESIS ASP PLEURAL SPACE W/IMG GUIDE  07/11/2017   VIDEO ASSISTED THORACOSCOPY (VATS)/EMPYEMA Right 07/12/2017   Procedure: right VIDEO ASSISTED THORACOSCOPY (VATS) for drainage of EMPYEMA and decortication;  Surgeon: Kerrin Elspeth BROCKS, MD;  Location: [facility] OR;  Service: Thoracic;  Laterality: Right;   VIDEO BRONCHOSCOPY N/A 07/12/2017   Procedure: VIDEO BRONCHOSCOPY;  Surgeon: Kerrin Elspeth BROCKS, MD;  Location: [facility] OR;  Service: Thoracic;  Laterality:  N/A;    Family History  Problem Relation Age of Onset   Hypertension Mother    Heart disease Mother    Hypertension Father    Heart disease Father    Stroke Brother    Hypertension Brother    Heart disease Brother     Social History:  reports that she has been smoking cigarettes. She has a 79.5 pack-year smoking history. She has never used smokeless tobacco. She reports current alcohol use of about 21.0 standard drinks of alcohol per week. She reports that she does not use drugs.  Allergies:  Allergies  Allergen Reactions   Dextromethorphan Other (See Comments)    Severe aggression     Medications: I have reviewed the patient's current medications.  Results for orders placed or performed during the hospital encounter of 03/09/24 (from the past 48 hours)  Basic metabolic panel     Status: Abnormal   Collection Time: 03/09/24  1:39 PM  Result Value Ref Range   Sodium 139 135 - 145 mmol/L   Potassium 3.9 3.5 - 5.1 mmol/L   Chloride 96 (L) 98 - 111 mmol/L   CO2 29 22 - 32 mmol/L   Glucose, Bld 138 (H) 70 - 99 mg/dL    Comment: Glucose reference range applies only to samples taken after fasting for at least 8 hours.   BUN 12 8 - 23 mg/dL   Creatinine, Ser 9.18 0.44 - 1.00 mg/dL   Calcium  8.5 (L) 8.9 - 10.3 mg/dL  GFR, Estimated >60 >60 mL/min    Comment: (NOTE) Calculated using the CKD-EPI Creatinine Equation (2021)    Anion gap 14 5 - 15    Comment: Performed at [facility], [address], [place], [place] [address]  CBC     Status: Abnormal   Collection Time: 03/09/24  1:39 PM  Result Value Ref Range   WBC 12.4 (H) 4.0 - 10.5 K/uL   RBC 4.56 3.87 - 5.11 MIL/uL   Hemoglobin 14.4 12.0 - 15.0 g/dL   HCT 55.4 63.9 - 53.9 %   MCV 97.6 80.0 - 100.0 fL   MCH 31.6 26.0 - 34.0 pg   MCHC 32.4 30.0 - 36.0 g/dL   RDW 87.4 88.4 - 84.4 %   Platelets 266 150 - 400 K/uL   nRBC 0.0 0.0 - 0.2 %    Comment: Performed at [facility], [address]  [place], [place] [address]    CT ABDOMEN PELVIS W CONTRAST Result Date: 03/09/2024 CLINICAL DATA:  Right lower quadrant mass on earlier lumbar spine CT, low back pain EXAM: CT ABDOMEN AND PELVIS WITH CONTRAST TECHNIQUE: Multidetector CT imaging of the abdomen and pelvis was performed using the standard protocol following bolus administration of intravenous contrast. RADIATION DOSE REDUCTION: This exam was performed according to the departmental dose-optimization program which includes automated exposure control, adjustment of the mA and/or kV according to patient size and/or use of iterative reconstruction technique. CONTRAST:  75mL OMNIPAQUE  IOHEXOL  350 MG/ML SOLN COMPARISON:  03/09/2024 FINDINGS: Lower chest: No acute pleural or parenchymal lung disease. Hepatobiliary: Dependent calcified gallstone within the gallbladder without cholecystitis. Liver is unremarkable. No biliary duct dilation or choledocholithiasis. Pancreas: Unremarkable. No pancreatic ductal dilatation or surrounding inflammatory changes. Spleen: Normal in size without focal abnormality. Adrenals/Urinary Tract: Multiple subcentimeter renal cortical cysts do not require specific imaging follow-up. No urinary tract calculi or obstructive uropathy within either kidney. Nodular thickening of the left adrenal gland measuring 1.1 cm, measuring 110 HU on this study and 30 HU on the preceding unenhanced CT lumbar spine exam, likely adenoma. Right adrenal is unremarkable. The bladder is grossly normal. Stomach/Bowel: No bowel obstruction or ileus. There is a lobular 6.1 x 4.7 x 5.1 cm peripherally calcified cystic mass in the right lower quadrant, which appears contiguous with the appendix. Findings are concerning for underlying mucocele. There are no acute inflammatory changes identified. Surgical consultation is recommended. Diffuse diverticulosis throughout the distal colon without evidence of acute diverticulitis. Vascular/Lymphatic: Aortic  atherosclerosis. No enlarged abdominal or pelvic lymph nodes. Reproductive: Status post hysterectomy. No adnexal masses. Other: No free fluid or free intraperitoneal gas. No abdominal wall hernia. Musculoskeletal: No acute or destructive bony abnormalities. L1 compression deformity is again noted, please see preceding CT lumbar spine for full description of lumbar spine findings. Reconstructed images demonstrate no additional findings. IMPRESSION: 1. 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 No acute inflammatory changes. Surgical consultation and excision with pathologic analysis is recommended to exclude neoplasm. 2. Cholelithiasis without cholecystitis. 3. Distal colonic diverticulosis without diverticulitis. 4. L1 compression fracture. Please see preceding CT lumbar spine report for complete lumbar spine findings. 5.  Aortic Atherosclerosis (ICD10-I70.0). Electronically Signed   By: Ozell Daring M.D.   On: 03/09/2024 18:22   CT Lumbar Spine Wo Contrast Result Date: 03/09/2024 EXAM: CT OF THE LUMBAR SPINE WITHOUT CONTRAST 03/09/2024 02:42:00 PM TECHNIQUE: CT of the lumbar spine was performed without  the administration of intravenous contrast. Multiplanar reformatted images are provided for review. Automated exposure control, iterative reconstruction, and/or weight based adjustment of the mA/kV was utilized to reduce the radiation dose to as low as reasonably achievable. COMPARISON: Lumbar spine radiographs 06/06/2014. CLINICAL HISTORY: Low back pain, increased fracture risk. FINDINGS: BONES AND ALIGNMENT: Likely subacute 45 percent compression fracture along the superior endplate at L1 with 6 mm posterior bony aortic pulsion and mild sclerosis along the superior endplate compression. Anterior and middle column involvement noted with no posterior column involvement identified. Left anterior flattening of the superior  endplate of L4 not changed from 06/06/2014 suggesting a remote subtle L4 compression. Bony demineralization. Normal alignment. DEGENERATIVE CHANGES: Mild disc bulge is present at L3-L4, L4-L5, and L5-S1 without impingement. SOFT TISSUES: No significant paraspinal edema. LUNGS: Scarring or subsegmental atelectasis in both lower lobes. VASCULATURE: Systemic atherosclerosis is present, including the aorta and iliac arteries. GALLBLADDER: Cholelithiasis. KIDNEYS, URETERS, AND BLADDER: 0.7 cm Bosniak category 2 benign but complex cyst of the left kidney upper pole with internal density 96 Hounsfield units on image 17 series 7. No further imaging workup of this lesion is indicated. STOMACH AND BOWEL: Sigmoid colon diverticulosis. Abnormal fluid density lesion with rim calcification measuring at least 5.7 cm in diameter in the right lower quadrant near the cecum on image 89 series 7, only partially included on today's exam. The appearance is nonspecific and could include mass, Meckel diverticulum, appendiceal mucocele, duplication cyst, or other abnormalities. Dedicated CT of the abdomen and pelvis with contrast is recommended for better assessment. IMPRESSION: 1. Likely subacute 45% compression fracture along the superior endplate at L1 with 6 mm posterior bony retropulsion and mild sclerosis, involving anterior and middle columns without posterior column involvement. 2. Abnormal rim-calcified fluid-density lesion at least 5.7 cm in diameter in the right lower quadrant near the cecum, only partially included; recommend dedicated contrast-enhanced CT of the abdomen and pelvis for further assessment. 3. Left anterior flattening of the superior endplate of L4, unchanged from 06/06/14, suggesting a remote subtle L4 compression. 4. Mild disc bulge at L3-L4, L4-L5, and L5-S1 without impingement. 5. Atherosclerosis. 6. Cholelithiasis. Electronically signed by: Ryan Salvage MD 03/09/2024 03:00 PM EST RP Workstation:  [id number]    Review of Systems  Constitutional:  Negative for chills and fever.  HENT:  Negative for ear discharge, hearing loss and sore throat.   Eyes:  Negative for discharge.  Respiratory:  Negative for cough and shortness of breath.   Cardiovascular:  Negative for chest pain and leg swelling.  Gastrointestinal:  Positive for abdominal pain. Negative for anal bleeding, constipation, diarrhea and nausea.  Musculoskeletal:  Negative for myalgias and neck pain.  Skin:  Negative for rash.  Allergic/Immunologic: Negative for environmental allergies.  Neurological:  Negative for dizziness and seizures.  Hematological:  Does not bruise/bleed easily.  Psychiatric/Behavioral:  Negative for suicidal ideas.   All other systems reviewed and are negative.  Blood pressure (!) 156/91, pulse 83, temperature 98.2 F (36.8 C), temperature source Oral, resp. rate 18, height 5' 8.5 (1.74 m), weight 83.9 kg, SpO2 94%. Physical Exam Constitutional:      Appearance: She is well-developed.     Comments: Conversant No acute distress  HENT:     Head: Normocephalic and atraumatic.  Eyes:     General: Lids are normal. No scleral icterus.    Pupils: Pupils are equal, round, and reactive to light.     Comments: Pupils are equal round and reactive No lid lag  Moist conjunctiva  Neck:     Thyroid: No thyromegaly.     Trachea: No tracheal tenderness.     Comments: No cervical lymphadenopathy Cardiovascular:     Rate and Rhythm: Normal rate and regular rhythm.     Heart sounds: No murmur heard. Pulmonary:     Effort: Pulmonary effort is normal.     Breath sounds: Normal breath sounds. No wheezing or rales.  Abdominal:     Tenderness: There is abdominal tenderness in the right lower quadrant. There is no guarding or rebound.     Hernia: No hernia is present.  Musculoskeletal:     Cervical back: Normal range of motion and neck supple.  Skin:    General: Skin is warm.     Findings: No rash.      Nails: There is no clubbing.     Comments: Normal skin turgor  Neurological:     Mental Status: She is alert and oriented to person, place, and time.     Comments: Normal gait and station  Psychiatric:        Mood and Affect: Mood normal.        Thought Content: Thought content normal.        Judgment: Judgment normal.     Comments: Appropriate affect     Assessment/Plan: 73 year old female with history of lower back pain, spasms, with abdominal appendiceal cystic mass. History of COPD Home O2 use  1.  Would admit to medical service for optimization. 2.  Will discuss with the acute surgery service in the a.m. in regards to optimization and possible need for right hemicolectomy. 3.  Okay for p.o. at this point as she is not having emergent/urgent surgery. 4.  Will follow along.  Lynda Leos 03/09/2024, 8:22 PM

## 2024-03-10 DIAGNOSIS — K388 Other specified diseases of appendix: Secondary | ICD-10-CM | POA: Diagnosis not present

## 2024-03-10 LAB — CBC
HCT: 42.5 % (ref 36.0–46.0)
Hemoglobin: 13.8 g/dL (ref 12.0–15.0)
MCH: 31.9 pg (ref 26.0–34.0)
MCHC: 32.5 g/dL (ref 30.0–36.0)
MCV: 98.2 fL (ref 80.0–100.0)
Platelets: 233 K/uL (ref 150–400)
RBC: 4.33 MIL/uL (ref 3.87–5.11)
RDW: 12.3 % (ref 11.5–15.5)
WBC: 11.6 K/uL — ABNORMAL HIGH (ref 4.0–10.5)
nRBC: 0 % (ref 0.0–0.2)

## 2024-03-10 LAB — BASIC METABOLIC PANEL WITH GFR
Anion gap: 9 (ref 5–15)
BUN: 14 mg/dL (ref 8–23)
CO2: 34 mmol/L — ABNORMAL HIGH (ref 22–32)
Calcium: 8.6 mg/dL — ABNORMAL LOW (ref 8.9–10.3)
Chloride: 95 mmol/L — ABNORMAL LOW (ref 98–111)
Creatinine, Ser: 0.74 mg/dL (ref 0.44–1.00)
GFR, Estimated: >60 mL/min (ref >60)
Glucose, Bld: 108 mg/dL — ABNORMAL HIGH (ref 70–99)
Potassium: 3.9 mmol/L (ref 3.5–5.1)
Sodium: 138 mmol/L (ref 135–145)

## 2024-03-10 LAB — PHOSPHORUS: Phosphorus: 4.7 mg/dL — ABNORMAL HIGH (ref 2.5–4.6)

## 2024-03-10 LAB — MAGNESIUM: Magnesium: 2 mg/dL (ref 1.7–2.4)

## 2024-03-10 MED ORDER — IPRATROPIUM-ALBUTEROL 0.5-2.5 (3) MG/3ML IN SOLN: 3.0000 mL | RESPIRATORY_TRACT | Status: DC | PRN

## 2024-03-10 MED ORDER — METOPROLOL SUCCINATE ER 50 MG PO TB24
100.0000 mg | ORAL_TABLET | Freq: Every morning | ORAL | Status: DC
  Administered 2024-03-10 – 2024-03-22 (×11): 100 mg via ORAL
  Filled 2024-03-10 (×13): qty 2

## 2024-03-10 MED ORDER — AMLODIPINE BESYLATE 5 MG PO TABS
5.0000 mg | ORAL_TABLET | Freq: Every day | ORAL | Status: DC
  Administered 2024-03-10 – 2024-03-22 (×12): 5 mg via ORAL
  Filled 2024-03-10 (×13): qty 1

## 2024-03-10 MED ORDER — ROSUVASTATIN CALCIUM 5 MG PO TABS
10.0000 mg | ORAL_TABLET | Freq: Every evening | ORAL | Status: DC
  Administered 2024-03-10 – 2024-03-21 (×12): 10 mg via ORAL
  Filled 2024-03-10 (×12): qty 2

## 2024-03-10 MED ORDER — METHOCARBAMOL 1000 MG/10ML IJ SOLN
500.0000 mg | Freq: Four times a day (QID) | INTRAMUSCULAR | Status: DC
  Administered 2024-03-10 – 2024-03-17 (×28): 500 mg via INTRAVENOUS
  Filled 2024-03-10 (×29): qty 10

## 2024-03-10 NOTE — Progress Notes (Addendum)
 Subjective: CC: Had some throbbing in her lower abdomen earlier. No abdominal pain currently. Having a lot of back spasm. Having one currently. She can't tell if the abdominal pain was radiating or referred from her back pain. She is tolerating CLD. Denies n/v. Passing flatus. Last BM yesterday. She does not think she has had a colonoscopy.   Objective: Vital signs in last 24 hours: Temp:  [97.9 F (36.6 C)-98.6 F (37 C)] 97.9 F (36.6 C) (11/04 0839) Pulse Rate:  [62-83] 66 (11/04 0839) Resp:  [14-20] 19 (11/04 0839) BP: (136-190)/(73-95) 169/95 (11/04 0839) SpO2:  [91 %-99 %] 99 % (11/04 0839) Weight:  [83.9 kg] 83.9 kg (11/03 1322) Last BM Date : 03/09/24  Intake/Output from previous day: No intake/output data recorded. Intake/Output this shift: No intake/output data recorded.  PE: Gen:  Alert, NAD, pleasant Abd: Soft, NT  Lab Results:  Recent Labs    03/09/24 1339 03/10/24 0521  WBC 12.4* 11.6*  HGB 14.4 13.8  HCT 44.5 42.5  PLT 266 233   BMET Recent Labs    03/09/24 1339 03/10/24 0521  NA 139 138  K 3.9 3.9  CL 96* 95*  CO2 29 34*  GLUCOSE 138* 108*  BUN 12 14  CREATININE 0.81 0.74  CALCIUM  8.5* 8.6*   PT/INR No results for input(s): LABPROT, INR in the last 72 hours. CMP     Component Value Date/Time   NA 138 03/10/2024 0521   K 3.9 03/10/2024 0521   CL 95 (L) 03/10/2024 0521   CO2 34 (H) 03/10/2024 0521   GLUCOSE 108 (H) 03/10/2024 0521   BUN 14 03/10/2024 0521   CREATININE 0.74 03/10/2024 0521   CREATININE 0.51 10/11/2011 1529   CALCIUM  8.6 (L) 03/10/2024 0521   PROT 6.5 01/19/2024 1125   ALBUMIN 3.5 01/19/2024 1125   AST 19 01/19/2024 1125   ALT 18 01/19/2024 1125   ALKPHOS 51 01/19/2024 1125   BILITOT 0.8 01/19/2024 1125   GFRNONAA >60 03/10/2024 0521   GFRAA >60 07/17/2017 0400   Lipase  No results found for: LIPASE  Studies/Results: CT ABDOMEN PELVIS W CONTRAST Result Date: 03/09/2024 CLINICAL DATA:  Right  lower quadrant mass on earlier lumbar spine CT, low back pain EXAM: CT ABDOMEN AND PELVIS WITH CONTRAST TECHNIQUE: Multidetector CT imaging of the abdomen and pelvis was performed using the standard protocol following bolus administration of intravenous contrast. RADIATION DOSE REDUCTION: This exam was performed according to the departmental dose-optimization program which includes automated exposure control, adjustment of the mA and/or kV according to patient size and/or use of iterative reconstruction technique. CONTRAST:  75mL OMNIPAQUE  IOHEXOL  350 MG/ML SOLN COMPARISON:  03/09/2024 FINDINGS: Lower chest: No acute pleural or parenchymal lung disease. Hepatobiliary: Dependent calcified gallstone within the gallbladder without cholecystitis. Liver is unremarkable. No biliary duct dilation or choledocholithiasis. Pancreas: Unremarkable. No pancreatic ductal dilatation or surrounding inflammatory changes. Spleen: Normal in size without focal abnormality. Adrenals/Urinary Tract: Multiple subcentimeter renal cortical cysts do not require specific imaging follow-up. No urinary tract calculi or obstructive uropathy within either kidney. Nodular thickening of the left adrenal gland measuring 1.1 cm, measuring 110 HU on this study and 30 HU on the preceding unenhanced CT lumbar spine exam, likely adenoma. Right adrenal is unremarkable. The bladder is grossly normal. Stomach/Bowel: No bowel obstruction or ileus. There is a lobular 6.1 x 4.7 x 5.1 cm peripherally calcified cystic mass in the right lower quadrant, which appears contiguous with the appendix. Findings  are concerning for underlying mucocele. There are no acute inflammatory changes identified. Surgical consultation is recommended. Diffuse diverticulosis throughout the distal colon without evidence of acute diverticulitis. Vascular/Lymphatic: Aortic atherosclerosis. No enlarged abdominal or pelvic lymph nodes. Reproductive: Status post hysterectomy. No adnexal  masses. Other: No free fluid or free intraperitoneal gas. No abdominal wall hernia. Musculoskeletal: No acute or destructive bony abnormalities. L1 compression deformity is again noted, please see preceding CT lumbar spine for full description of lumbar spine findings. Reconstructed images demonstrate no additional findings. IMPRESSION: 1. 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 No acute inflammatory changes. Surgical consultation and excision with pathologic analysis is recommended to exclude neoplasm. 2. Cholelithiasis without cholecystitis. 3. Distal colonic diverticulosis without diverticulitis. 4. L1 compression fracture. Please see preceding CT lumbar spine report for complete lumbar spine findings. 5.  Aortic Atherosclerosis (ICD10-I70.0). Electronically Signed   By: Ozell Daring M.D.   On: 03/09/2024 18:22   CT Lumbar Spine Wo Contrast Result Date: 03/09/2024 EXAM: CT OF THE LUMBAR SPINE WITHOUT CONTRAST 03/09/2024 02:42:00 PM TECHNIQUE: CT of the lumbar spine was performed without the administration of intravenous contrast. Multiplanar reformatted images are provided for review. Automated exposure control, iterative reconstruction, and/or weight based adjustment of the mA/kV was utilized to reduce the radiation dose to as low as reasonably achievable. COMPARISON: Lumbar spine radiographs 06/06/2014. CLINICAL HISTORY: Low back pain, increased fracture risk. FINDINGS: BONES AND ALIGNMENT: Likely subacute 45 percent compression fracture along the superior endplate at L1 with 6 mm posterior bony aortic pulsion and mild sclerosis along the superior endplate compression. Anterior and middle column involvement noted with no posterior column involvement identified. Left anterior flattening of the superior endplate of L4 not changed from 06/06/2014 suggesting a remote subtle L4 compression. Bony demineralization. Normal  alignment. DEGENERATIVE CHANGES: Mild disc bulge is present at L3-L4, L4-L5, and L5-S1 without impingement. SOFT TISSUES: No significant paraspinal edema. LUNGS: Scarring or subsegmental atelectasis in both lower lobes. VASCULATURE: Systemic atherosclerosis is present, including the aorta and iliac arteries. GALLBLADDER: Cholelithiasis. KIDNEYS, URETERS, AND BLADDER: 0.7 cm Bosniak category 2 benign but complex cyst of the left kidney upper pole with internal density 96 Hounsfield units on image 17 series 7. No further imaging workup of this lesion is indicated. STOMACH AND BOWEL: Sigmoid colon diverticulosis. Abnormal fluid density lesion with rim calcification measuring at least 5.7 cm in diameter in the right lower quadrant near the cecum on image 89 series 7, only partially included on today's exam. The appearance is nonspecific and could include mass, Meckel diverticulum, appendiceal mucocele, duplication cyst, or other abnormalities. Dedicated CT of the abdomen and pelvis with contrast is recommended for better assessment. IMPRESSION: 1. Likely subacute 45% compression fracture along the superior endplate at L1 with 6 mm posterior bony retropulsion and mild sclerosis, involving anterior and middle columns without posterior column involvement. 2. Abnormal rim-calcified fluid-density lesion at least 5.7 cm in diameter in the right lower quadrant near the cecum, only partially included; recommend dedicated contrast-enhanced CT of the abdomen and pelvis for further assessment. 3. Left anterior flattening of the superior endplate of L4, unchanged from 06/06/14, suggesting a remote subtle L4 compression. 4. Mild disc bulge at L3-L4, L4-L5, and L5-S1 without impingement. 5. Atherosclerosis. 6. Cholelithiasis. Electronically signed by: Ryan Salvage MD 03/09/2024 03:00 PM EST RP Workstation: [id number]    Anti-infectives: Anti-infectives (From admission, onward)    None  Assessment/Plan 73 year old female with history HFpEF and COPD on home o2 who presented with lower back pain and spasms and was found to have abdominal appendiceal cystic mass on imaging.  - CT A/P with a lobular 6.1 x 4.7 x 5.1 cm peripherally calcified cystic mass in the right lower quadrant, which appears contiguous with the appendix and are concerning for underlying mucocele. There are no acute inflammatory changes identified.  - No current indication for emergency surgery. She is not currently having any abdominal pain and is NT on exam. Afebrile. WBC downtrending at 11.6. She has already been advanced to a HH diet. Would like to ensure she tolerates this. Will send a message to [facility] to ensure she has close follow up. Will likely need referral for colonoscopy before she see's us  in the office. Would also recommend follow up with cards and pulmonology prior to appointment to ensure she is risk stratified and optimized for OR.   I reviewed nursing notes, last 24 h vitals and pain scores, last 48 h intake and output, last 24 h labs and trends, and last 24 h imaging results.    LOS: 1 day    Ozell CHRISTELLA Shaper, [facility] Surgery 03/10/2024, 9:39 AM Please see Amion for pager number during day hours 7:00am-4:30pm

## 2024-03-10 NOTE — Progress Notes (Signed)
 PT Cancellation Note  Patient Details Name: Erica Ball MRN: [id number] DOB: 22-Aug-1950   Cancelled Treatment:    Reason Eval/Treat Not Completed: Pain limiting ability to participate  Per discussion with OT, pt has refused OT eval twice this morning due to pain (even on OT return after pain meds administered). Will coordinate next attempt this p.m. with pain medication administration.    Macario RAMAN, PT Acute Rehabilitation Services  Office [phone]   Macario SHAUNNA Soja 03/10/2024, 10:53 AM

## 2024-03-10 NOTE — Progress Notes (Addendum)
 PROGRESS NOTE  Erica Ball  DOB: [birth year]  PCP: Macel Jayson PARAS, MD FMW:[id number]  DOA: 03/09/2024  LOS: 1 day  Hospital Day: 2  Subjective: Patient was seen and examined this morning. Pleasant elderly Caucasian female.  Lying in bed.  In distress because of severe back pain.  Denies history of chronic pain Overnight, afebrile, heart rate in 60s, blood pressure improved to 130s this morning Labs with WC count 11.6, renal function normal  Brief narrative: Erica Ball is a 73 y.o. 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   Patient has been at [facility] as a long-term resident since May 2025.  Prior to that while living in the community, patient used to be a chronic smoker drinker.  11/3, patient was brought to the ED with complaint of lower back spasms not responding to tramadol .  No history of trauma or falls. For EMS, she was able to stand/walk to stretcher  In the ED, patient was afebrile, blood pressure elevated to 180s on 2 L oxygen Labs with WC count 12.4, renal function normal CT lumbar spine was obtained which showed - Likely subacute 45% compression fracture along the superior endplate at L1 with 6 mm posterior bony retropulsion and mild sclerosis, involving anterior and middle columns without posterior column involvement. - Left anterior flattening of the superior endplate of L4, unchanged from 06/06/14, suggesting a remote subtle L4 compression. - Mild disc bulge at L3-L4, L4-L5, and L5-S1 without impingement.  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 No acute inflammatory changes. Surgical consultation and excision with pathologic analysis is recommended to exclude neoplasm. -Cholelithiasis without cholecystitis. -Distal colonic diverticulosis without diverticulitis.  EDP discussed  the case with general surgery  Given pain meds Admitted to [facility]  Assessment and plan: Acute on chronic back pain Acute L1 compression fracture Remote L4 compression fracture Sent from [facility] for lower back spasms not responding to tramadol  Imagings as above showing acute and remote compression fractures  Continue to monitor with pain control, physical therapy PT eval requested Fall precautions She was in significant pain at the time of evaluation this morning.  I added IV Robaxin scheduled.  Pain regimen --- Scheduled: IV Robaxin 5 mg every 6 hours --- PRN: Tylenol  IV Dilaudid  0.5 every 4 hours, oral oxycodone  5 mg every 6 hours, Tylenol  If no improvement with pain meds, may need kyphoplasty  Appendiceal mass CT abdomen findings as above with appendiceal mass  Per general surgery, the findings are concerning for underlying mucocele.  There is no acute inflammatory changes identified.  No current indication of emergent surgery.  Started on diet. General surgery has also put in a referral for pulmonology, cardiology and GI eval for risk stratification prior to surgery  Chronic diastolic CHF HTN H/o SVT Blood pressure elevated, partly because of pain as well.   Euvolemic on exam  PTA meds- Toprol  100 mg daily as well as Lopressor  25 mg nightly, amlodipine  5 mg daily, hydralazine  25 mg 3 times daily, torsemide 20 mg daily Resume Toprol  100 mg daily, amlodipine  5 mg daily.  Continue to hold others last 2D echo done on 10/30/2022 revealed LVEF 60-65% with grade 1 diastolic dysfunction. Continue to monitor on telemetry  COPD 80-pack-year smoking history Chronic daily smoker Chronic hypoxic respiratory failure On 2 L nasal cannula as needed prior to admission Counseled  to quit smoking Nicotine  patch offered Continue bronchodilators  HLD Crestor  10 mg daily   Mobility: Will benefit from PT/OT eval.  It seems she was unable to participate today because of pain. PT Orders:  Active   PT Follow up Rec:    Goals of care   Code Status: Full Code     DVT prophylaxis:  heparin injection 5,000 Units Start: 03/10/24 1400   Antimicrobials: None Fluid: None Consultants: None Family Communication: This afternoon, I called and updated patient's son  Status: Inpatient Level of care:  Telemetry   Patient is from: Long-term resident at [facility] Needs to continue in-hospital care: Ongoing workup.  Needs IV pain medicines Anticipated d/c to: Hopefully back to facility 1 to 2 days   Diet:  Diet Order             Diet Heart Room service appropriate? Yes; Fluid consistency: Thin  Diet effective now                   Scheduled Meds:  amLODipine   5 mg Oral Daily   heparin  5,000 Units Subcutaneous Q8H   methocarbamol (ROBAXIN) injection  500 mg Intravenous Q6H   metoprolol  succinate  100 mg Oral q AM   rosuvastatin   10 mg Oral QPM    PRN meds: acetaminophen , HYDROmorphone  (DILAUDID ) injection, ipratropium-albuterol , melatonin, oxyCODONE , polyethylene glycol, prochlorperazine    Infusions:    Antimicrobials: Anti-infectives (From admission, onward)    None       Objective: Vitals:   03/10/24 0339 03/10/24 0839  BP: 136/73 (!) 169/95  Pulse: 62 66  Resp:  19  Temp: 98.2 F (36.8 C) 97.9 F (36.6 C)  SpO2: 99% 99%    Intake/Output Summary (Last 24 hours) at 03/10/2024 1539 Last data filed at 03/10/2024 1300 Gross per 24 hour  Intake 0 ml  Output --  Net 0 ml   Filed Weights   03/09/24 1322  Weight: 83.9 kg   Weight change:  Body mass index is 27.72 kg/m.   Physical Exam: General exam: Pleasant, elderly Caucasian female.  In distress from pain Skin: No rashes, lesions or ulcers. HEENT: Atraumatic, normocephalic, no obvious bleeding Lungs: Clear to auscultation bilaterally,  CVS: S1, S2, no murmur,   GI/Abd: Soft, nontender, nondistended, bowel sound present,   CNS: Alert, awake, oriented x 3 Psychiatry: Distressed and  tearful from back pain Extremities: No pedal edema, no calf tenderness,   Data Review: I have personally reviewed the laboratory data and studies available.  F/u labs ordered Unresulted Labs (From admission, onward)    None       Signed, Chapman Rota, MD [facility] 03/10/2024

## 2024-03-10 NOTE — Progress Notes (Signed)
 PT Cancellation Note  Patient Details Name: Erica Ball MRN: [id number] DOB: 02/11/1951   Cancelled Treatment:    Reason Eval/Treat Not Completed: Pain limiting ability to participate  Despite premedication with IV pain meds, pt in too much pain to attempt even repositioning in the bed. Family present and stated they are waiting to discuss with MD ?source of her pain. Will reattempt 03/11/24.    Erica RAMAN, PT Acute Rehabilitation Services  Office [phone]  Erica Ball 03/10/2024, 3:26 PM

## 2024-03-10 NOTE — Plan of Care (Signed)
  Problem: Clinical Measurements: Goal: Ability to maintain clinical measurements within normal limits will improve Outcome: Progressing   Problem: Coping: Goal: Level of anxiety will decrease Outcome: Progressing   Problem: Pain Managment: Goal: General experience of comfort will improve and/or be controlled Outcome: Progressing   Problem: Activity: Goal: Risk for activity intolerance will decrease Outcome: Not Progressing

## 2024-03-10 NOTE — Plan of Care (Signed)
  Problem: Clinical Measurements: Goal: Diagnostic test results will improve Outcome: Not Progressing   Problem: Clinical Measurements: Goal: Respiratory complications will improve Outcome: Not Progressing   Problem: Activity: Goal: Risk for activity intolerance will decrease Outcome: Not Progressing   Problem: Nutrition: Goal: Adequate nutrition will be maintained Outcome: Not Progressing   Problem: Coping: Goal: Level of anxiety will decrease Outcome: Not Progressing   Problem: Pain Managment: Goal: General experience of comfort will improve and/or be controlled Outcome: Not Progressing   Problem: Safety: Goal: Ability to remain free from injury will improve Outcome: Not Progressing

## 2024-03-10 NOTE — Progress Notes (Signed)
 OT Cancellation Note  Patient Details Name: Erica Ball MRN: [id number] DOB: 1951/02/21   Cancelled Treatment:    Reason Eval/Treat Not Completed: Other (comment) Attempted co-evaluation with PT with premedication of IV pain meds. Pt remains in too much pain to attempt repositioning in bed. Family present in room, stating they are awaiting MD to discuss source of pain. OT to continue to follow Pt and attempt evaluation as appropriate.   Maurilio CROME, OTR/LSABRA  [facility] Acute Rehabilitation  Office: [phone]   Maurilio PARAS Alexia Dinger 03/10/2024, 3:45 PM

## 2024-03-10 NOTE — Progress Notes (Signed)
 OT Cancellation Note  Patient Details Name: Erica Ball MRN: [id number] DOB: 09-01-1950   Cancelled Treatment:    Reason Eval/Treat Not Completed: Other (comment) OT evaluation attempted twice this AM. Pt refusing d/t pain even with return after pain meds administered. OT to follow Pt as appropriate and schedule allows, will coordinate next attempt this p.m. with pain medication administration.  Maurilio CROME, OTR/LSABRA  [facility] Acute Rehabilitation  Office: [phone]   Maurilio PARAS Marieliz Strang 03/10/2024, 11:12 AM

## 2024-03-11 ENCOUNTER — Inpatient Hospital Stay (HOSPITAL_COMMUNITY)

## 2024-03-11 DIAGNOSIS — K388 Other specified diseases of appendix: Secondary | ICD-10-CM | POA: Diagnosis not present

## 2024-03-11 MED ORDER — OXYCODONE HCL 5 MG PO TABS
5.0000 mg | ORAL_TABLET | Freq: Four times a day (QID) | ORAL | Status: AC | PRN
  Administered 2024-03-11 – 2024-03-16 (×14): 5 mg via ORAL
  Filled 2024-03-11 (×15): qty 1

## 2024-03-11 MED ORDER — HYDROMORPHONE HCL 1 MG/ML IJ SOLN
0.5000 mg | INTRAMUSCULAR | Status: AC | PRN
  Administered 2024-03-11 (×2): 0.5 mg via INTRAVENOUS
  Filled 2024-03-11 (×2): qty 0.5

## 2024-03-11 MED ORDER — HYDROMORPHONE HCL 1 MG/ML IJ SOLN
0.5000 mg | INTRAMUSCULAR | Status: AC | PRN
  Administered 2024-03-11 – 2024-03-16 (×19): 0.5 mg via INTRAVENOUS
  Filled 2024-03-11 (×21): qty 0.5

## 2024-03-11 MED ORDER — BISACODYL 10 MG RE SUPP
10.0000 mg | Freq: Once | RECTAL | Status: AC
Start: 2024-03-11 — End: 2024-03-11
  Administered 2024-03-11: 10 mg via RECTAL
  Filled 2024-03-11: qty 1

## 2024-03-11 MED ORDER — SENNOSIDES-DOCUSATE SODIUM 8.6-50 MG PO TABS
1.0000 | ORAL_TABLET | Freq: Two times a day (BID) | ORAL | Status: DC
  Administered 2024-03-11 – 2024-03-22 (×20): 1 via ORAL
  Filled 2024-03-11 (×22): qty 1

## 2024-03-11 NOTE — Progress Notes (Addendum)
 PROGRESS NOTE  [name]  DOB: July 07, 1950  PCP: Macel Jayson PARAS, MD FMW:[id number]  DOA: 03/09/2024  LOS: 2 days  Hospital Day: 3  Subjective: Patient was seen and examined this morning. Lying on bed.  Complains of pain and spasm in the back.  Also has diffuse abdominal pain. X-ray abdomen with mild colonic stool burden Given Dulcolax earlier Family not at bedside  Afebrile, hemodynamically stable Repeat labs this morning unremarkable  Brief narrative: Erica Ball is a 73 y.o. 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   Patient has been at [facility] as a long-term resident since May 2025.  Prior to that while living in the community, patient used to be a chronic smoker drinker.  11/3, patient was brought to the ED with complaint of lower back spasms not responding to tramadol .  No history of trauma or falls. For EMS, she was able to stand/walk to stretcher  In the ED, patient was afebrile, blood pressure elevated to 180s on 2 L oxygen Labs with WC count 12.4, renal function normal CT lumbar spine was obtained which showed - Likely subacute 45% compression fracture along the superior endplate at L1 with 6 mm posterior bony retropulsion and mild sclerosis, involving anterior and middle columns without posterior column involvement. - Left anterior flattening of the superior endplate of L4, unchanged from 06/06/14, suggesting a remote subtle L4 compression. - Mild disc bulge at L3-L4, L4-L5, and L5-S1 without impingement.  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 No acute inflammatory changes. Surgical consultation and excision with pathologic analysis is recommended to exclude neoplasm. -Cholelithiasis without cholecystitis. -Distal colonic diverticulosis without diverticulitis.  EDP discussed  the case with general surgery  Given pain meds Admitted to [facility]  Assessment and plan: Acute on chronic back pain Acute L1 compression fracture Remote L4 compression fracture Sent from [facility] for lower back spasms not responding to tramadol  Imagings as above showed acute as well as remote compression fractures  For pain control, patient is currently on scheduled Robaxin and as needed opioids but still complains of significant pain.  She was unable to participate with physical therapy yesterday. I have requested IR to consider kyphoplasty. Continue fall precautions Pain regimen --- Scheduled: IV Robaxin 5 mg every 6 hours --- PRN: Tylenol  IV Dilaudid  0.5 every 4 hours, oral oxycodone  5 mg every 6 hours, Tylenol  If no improvement with pain meds, may need kyphoplasty  Acute abdominal pain Complains of abdominal pain since yesterday. On exam, has mild diffuse tenderness CT abdomen on admission did not show any acute pathology X-ray abdomen today showed mild colonic stool burden Given Dulcolax suppository earlier.  Will give 1 dose of MiraLAX  and start Senokot twice daily scheduled.  Appendiceal mass CT abdomen findings as above with appendiceal mass  Per general surgery, the findings are concerning for underlying mucocele.  There is no acute inflammatory changes identified.  No current indication of emergent surgery.  Started on diet. General surgery has also put in a referral for pulmonology, cardiology and GI eval for risk stratification prior to surgery. I do not think her abdominal pain is related to the appendiceal mass.  Chronic diastolic CHF HTN H/o SVT Blood pressure elevated, partly because of pain as well.   Euvolemic on exam  PTA meds- Toprol  100 mg daily as well as Lopressor  25 mg  nightly, amlodipine  5 mg daily, hydralazine  25 mg 3 times daily, torsemide 20 mg daily Currently on Toprol  100 mg daily, amlodipine  5 mg daily.  On hold are hydralazine , torsemide last 2D  echo done on 10/30/2022 revealed LVEF 60-65% with grade 1 diastolic dysfunction. Continue to monitor on telemetry  COPD 80-pack-year smoking history Chronic daily smoker Chronic hypoxic respiratory failure On 2 L nasal cannula as needed prior to admission Counseled to quit smoking Nicotine  patch offered Continue bronchodilators  HLD Crestor  10 mg daily   Mobility: Encouraged participate with PT/OT PT Orders: Active   PT Follow up Rec: Skilled Nursing-Short Term Rehab (<3 Hours/Day)03/11/2024 1300   Goals of care   Code Status: Full Code     DVT prophylaxis:  heparin injection 5,000 Units Start: 03/10/24 1400   Antimicrobials: None Fluid: None Consultants: None Family Communication: I called and discussed with patient's son this afternoon.  Status: Inpatient Level of care:  Telemetry   Patient is from: Long-term resident at [facility] Needs to continue in-hospital care: Ongoing workup.  Needs IV pain medicines Anticipated d/c to: Hopefully back to facility 1 to 2 days   Diet:  Diet Order             Diet Heart Room service appropriate? Yes; Fluid consistency: Thin  Diet effective now                   Scheduled Meds:  amLODipine   5 mg Oral Daily   heparin  5,000 Units Subcutaneous Q8H   methocarbamol (ROBAXIN) injection  500 mg Intravenous Q6H   metoprolol  succinate  100 mg Oral q AM   rosuvastatin   10 mg Oral QPM   senna-docusate  1 tablet Oral BID    PRN meds: acetaminophen , HYDROmorphone  (DILAUDID ) injection, ipratropium-albuterol , melatonin, oxyCODONE , polyethylene glycol, prochlorperazine    Infusions:    Antimicrobials: Anti-infectives (From admission, onward)    None       Objective: Vitals:   03/11/24 0210 03/11/24 0822  BP:  130/66  Pulse:  62  Resp:  18  Temp:  97.6 F (36.4 C)  SpO2: 93% 98%    Intake/Output Summary (Last 24 hours) at 03/11/2024 1435 Last data filed at 03/11/2024 0500 Gross per 24 hour  Intake 457 ml   Output 700 ml  Net -243 ml   Filed Weights   03/09/24 1322 03/11/24 0500  Weight: 83.9 kg 83.3 kg   Weight change: -0.615 kg Body mass index is 27.52 kg/m.   Physical Exam: General exam: Pleasant, elderly Caucasian female.  In distress from pain Skin: No rashes, lesions or ulcers. HEENT: Atraumatic, normocephalic, no obvious bleeding Lungs: Clear to auscultation bilaterally,  CVS: S1, S2, no murmur,   GI/Abd: Soft, mild diffuse tenderness, mild distention present, bowel sound present,   CNS: Alert, awake, oriented x 3 Psychiatry: Distressed and tearful from back pain Extremities: No pedal edema, no calf tenderness,   Data Review: I have personally reviewed the laboratory data and studies available.  F/u labs ordered Unresulted Labs (From admission, onward)    None       Signed, Chapman Rota, MD [facility] 03/11/2024

## 2024-03-11 NOTE — Evaluation (Signed)
 Physical Therapy Evaluation Patient Details Name: Erica Ball MRN: [id number] DOB: 10/11/1950 Today's Date: 03/11/2024  History of Present Illness  73 y.o. female presents to [facility] from SNF d/t low back pain and back spasms. Upon EMS arrival, Pt was hypoxic with O2 saturation in the 80s on RA, improved on 2 L nasal cannula and BP was significantly elevated 180/110. 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 Surgery consult with ?plans for hemicolectomy. PMH: COPD, empyema requiring VATS, chronic hypoxia on 2 L [place] as needed, SVT, tobacco abuse, HTN.   Clinical Impression  Pt admitted with above diagnosis. PTA, pt was modI with functional mobility using a rollator and independent with ADLs. She is a long-term resident at [facility]. Pt currently with functional limitations due to the deficits listed below (see PT Problem List). She required totalA x2 to roll R/L. Pt declined to attempt sitting EOB d/t severe pain. Pt is currently limited by decreased cognition, abdominal/back pain, generalized deconditioning, decreased balance, ad impaired activity tolerance. Pt will benefit from acute skilled PT to increase her independence and safety with mobility to allow discharge. Recommend continued inpatient follow up therapy, <3 hours/day.     If plan is discharge home, recommend the following: Two people to help with walking and/or transfers;Two people to help with bathing/dressing/bathroom;Assistance with cooking/housework;Assist for transportation;Help with stairs or ramp for entrance   Can travel by private vehicle   No    Equipment Recommendations Hospital bed;Hoyer lift;Wheelchair (measurements PT);Wheelchair cushion (measurements PT);BSC/3in1  Recommendations for Other Services       Functional Status Assessment Patient has had a recent decline in their functional status and demonstrates the  ability to make significant improvements in function in a reasonable and predictable amount of time.     Precautions / Restrictions Precautions Precautions: Fall Recall of Precautions/Restrictions: Impaired Restrictions Weight Bearing Restrictions Per Provider Order: No      Mobility  Bed Mobility Overal bed mobility: Needs Assistance Bed Mobility: Rolling Rolling: Total assist, +2 for physical assistance, Used rails         General bed mobility comments: Pt rolled R/L in bed. Cues for sequencing. She was able to bend knee opposite the side she was going and attempt to reach to bed rail, but would stop assisting once a wave a pain hit her. TotalA x2 with support at trunk/pelvis to roll pt into sidelying. Her linens were found to be soiled. Performed bed change. Repositioned pt with bed features. She declined to attempting sitting EOB d/t severe pain.    Transfers                   General transfer comment: Unable to attempt d/t significant pain.    Ambulation/Gait               General Gait Details: Unable to attempt d/t significant pain.  Stairs            Wheelchair Mobility     Tilt Bed    Modified Rankin (Stroke Patients Only)       Balance Overall balance assessment: Needs assistance (Session remained bed level)                                           Pertinent Vitals/Pain Pain Assessment Pain Assessment: 0-10 Pain Score:  10-Worst pain ever Pain Location: Back and Abdomen Pain Descriptors / Indicators: Grimacing, Moaning, Pressure Pain Intervention(s): Premedicated before session, Limited activity within patient's tolerance, Monitored during session, Patient requesting pain meds-RN notified    Home Living Family/patient expects to be discharged to:: Skilled nursing facility [facility])                 Home Equipment: Rollator (4 wheels);Shower seat Additional Comments: Pt reports she last recieved therapy  a couple weeks ago    Prior Function Prior Level of Function : Independent/Modified Independent;History of Falls (last six months)       Physical Assist : (P) Mobility (physical);ADLs (physical)   ADLs (physical): (P) IADLs Mobility Comments: Ambulates using rollator. 2 falls recently. ADLs Comments: Indep with basic self-care. Relies on staff for all IADLs.     Extremity/Trunk Assessment   Upper Extremity Assessment Upper Extremity Assessment: Defer to OT evaluation    Lower Extremity Assessment Lower Extremity Assessment: Generalized weakness    Cervical / Trunk Assessment Cervical / Trunk Assessment: Other exceptions Cervical / Trunk Exceptions: Likely subacute 45% compression fracture along the superior endplate at L1 with 6 mm posterior bony retropulsion and mild sclerosis, involving anterior and middle columns without posterior column involvement. Mild disc bulge at L3-L4, L4-L5, and L5-S1 without impingement.  Communication   Communication Communication: Impaired Factors Affecting Communication: Hearing impaired    Cognition Arousal: Alert Behavior During Therapy: Restless   PT - Cognitive impairments: No family/caregiver present to determine baseline, Attention, Initiation, Sequencing, Problem solving, Safety/Judgement                       PT - Cognition Comments: Pt is currently fixated on her pain, internally distracted. Following commands: Impaired Following commands impaired: Follows one step commands inconsistently     Cueing Cueing Techniques: Verbal cues, Gestural cues     General Comments General comments (skin integrity, edema, etc.): Pt greeted on 4L O2 via [place]. Her SpO2 varied between 84-95%. Cued PLB technique.    Exercises     Assessment/Plan    PT Assessment Patient needs continued PT services  PT Problem List Decreased strength;Decreased activity tolerance;Decreased balance;Decreased mobility;Decreased safety awareness;Pain        PT Treatment Interventions DME instruction;Gait training;Functional mobility training;Therapeutic activities;Therapeutic exercise;Balance training;Patient/family education;Wheelchair mobility training    PT Goals (Current goals can be found in the Care Plan section)  Acute Rehab PT Goals Patient Stated Goal: Have less pain PT Goal Formulation: With patient Time For Goal Achievement: 03/25/24 Potential to Achieve Goals: Fair    Frequency Min 2X/week     Co-evaluation PT/OT/SLP Co-Evaluation/Treatment: Yes Reason for Co-Treatment: Necessary to address cognition/behavior during functional activity;For patient/therapist safety PT goals addressed during session: Mobility/safety with mobility         AM-PAC PT 6 Clicks Mobility  Outcome Measure Help needed turning from your back to your side while in a flat bed without using bedrails?: Total Help needed moving from lying on your back to sitting on the side of a flat bed without using bedrails?: Total Help needed moving to and from a bed to a chair (including a wheelchair)?: Total Help needed standing up from a chair using your arms (e.g., wheelchair or bedside chair)?: Total Help needed to walk in hospital room?: Total Help needed climbing 3-5 steps with a railing? : Total 6 Click Score: 6    End of Session   Activity Tolerance: Patient limited by pain Patient left: in  bed;with call bell/phone within reach;with bed alarm set Nurse Communication: Mobility status;Need for lift equipment PT Visit Diagnosis: Pain;Muscle weakness (generalized) (M62.81);Other abnormalities of gait and mobility (R26.89);Unsteadiness on feet (R26.81);Difficulty in walking, not elsewhere classified (R26.2) Pain - part of body:  (Back and Abdomen)    Time: 8867-8851 PT Time Calculation (min) (ACUTE ONLY): 16 min   Charges:   PT Evaluation $PT Eval Moderate Complexity: 1 Mod   PT General Charges $$ ACUTE PT VISIT: 1 Visit         Randall SAUNDERS,  PT, DPT Acute Rehabilitation Services Office: [phone] Secure Chat Preferred  Delon CHRISTELLA Callander 03/11/2024, 1:14 PM

## 2024-03-11 NOTE — [place] FL2 (Signed)
 [place]  MEDICAID FL2 LEVEL OF CARE FORM     IDENTIFICATION  Patient Name: Erica Ball Birthdate: 1951/01/26 Sex: female Admission Date (Current Location): 03/09/2024  [place] and [place] Number:  Lloyd [id number] Facility and Address:  [facility], 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   T4 vertebral fracture (HCC) 01/18/2024   Protein-calorie malnutrition, severe 11/23/2022   Generalized weakness 11/21/2022   SVT (supraventricular tachycardia) 11/04/2022   Demand ischemia (HCC) 11/04/2022   Malnutrition of moderate degree 10/31/2022   Tibial plateau fracture 10/24/2022   Hyponatremia 10/24/2022   Hypomagnesemia 10/24/2022   Tibial plateau fracture, left, closed, initial encounter 10/23/2022   Fall at home, initial encounter 10/23/2022   COPD with acute exacerbation (HCC) 12/02/2017   Elevated troponin 07/10/2017   Pleural effusion on right 07/10/2017   UTI (urinary tract infection) 07/10/2017   Sepsis (HCC) 07/10/2017   Lobar pneumonia 07/10/2017   Acute kidney injury 07/10/2017   Acute respiratory failure with hypoxia (HCC) 07/10/2017   Alcohol abuse    Tobacco abuse 08/20/2014   Hypertension 10/30/2011   Diverticulitis 10/30/2011   Vitamin D  deficiency 10/30/2011    Orientation RESPIRATION BLADDER Height & Weight     Self, Time, Situation, Place  O2 Incontinent, External catheter Weight: 183 lb 10.3 oz (83.3 kg) Height:  5' 8.5 (174 cm)  BEHAVIORAL  SYMPTOMS/MOOD NEUROLOGICAL BOWEL NUTRITION STATUS        Diet (see discharge summary)  AMBULATORY STATUS COMMUNICATION OF NEEDS Skin   Total Care Verbally Other (Comment) (redness)                       Personal Care Assistance Level of Assistance  Bathing, Feeding, Dressing, Total care Bathing Assistance: Maximum assistance Feeding assistance: Limited assistance Dressing Assistance: Maximum assistance Total Care Assistance: Maximum assistance   Functional Limitations Info  Sight, Hearing, Speech Sight Info: Adequate Hearing Info: Adequate Speech Info: Adequate    SPECIAL CARE FACTORS FREQUENCY  PT (By licensed PT), OT (By licensed OT)     PT Frequency: 5x week OT Frequency: 5x week            Contractures Contractures Info: Not present    Additional Factors Info  Code Status, Allergies Code Status Info: full Allergies Info: Dextromethorphan           Current Medications (03/11/2024):  This is the current hospital active medication list Current Facility-Administered Medications  Medication Dose Route Frequency Provider Last Rate Last Admin   acetaminophen  (TYLENOL ) tablet 500 mg  500 mg Oral Q6H PRN Shona Laurence N, DO       amLODipine  (NORVASC ) tablet 5 mg  5 mg Oral Daily Dahal, Binaya, MD   5 mg at 03/11/24 1004   heparin injection 5,000 Units  5,000 Units Subcutaneous Q8H Hall, Carole N, DO   5,000 Units at 03/11/24 1352   HYDROmorphone  (DILAUDID ) injection 0.5 mg  0.5 mg Intravenous Q4H PRN Arlice Reichert, MD   0.5 mg  at 03/11/24 1517   ipratropium-albuterol  (DUONEB) 0.5-2.5 (3) MG/3ML nebulizer solution 3 mL  3 mL Nebulization Q2H PRN Shona Laurence N, DO       melatonin tablet 5 mg  5 mg Oral QHS PRN Hall, Carole N, DO   5 mg at 03/10/24 2038   methocarbamol (ROBAXIN) injection 500 mg  500 mg Intravenous Q6H Dahal, Chapman, MD   500 mg at 03/11/24 1229   metoprolol  succinate (TOPROL -XL) 24 hr tablet 100 mg  100 mg Oral q AM Arlice Chapman, MD   100 mg at  03/11/24 9396   oxyCODONE  (Oxy IR/ROXICODONE ) immediate release tablet 5 mg  5 mg Oral Q6H PRN Dahal, Chapman, MD   5 mg at 03/11/24 1229   polyethylene glycol (MIRALAX  / GLYCOLAX ) packet 17 g  17 g Oral Daily PRN Shona Laurence N, DO   17 g at 03/11/24 1111   prochlorperazine  (COMPAZINE ) injection 5 mg  5 mg Intravenous Q6H PRN Shona Laurence N, DO       rosuvastatin  (CRESTOR ) tablet 10 mg  10 mg Oral QPM Dahal, Binaya, MD   10 mg at 03/10/24 1739   senna-docusate (Senokot-S) tablet 1 tablet  1 tablet Oral BID Dahal, Binaya, MD   1 tablet at 03/11/24 1352     Discharge Medications: Please see discharge summary for a list of discharge medications.  Relevant Imaging Results:  Relevant Lab Results:   Additional Information SSN: [id number]  Bridget Cordella Simmonds, LCSW

## 2024-03-11 NOTE — TOC Initial Note (Signed)
 Transition of Care [facility]) - Initial/Assessment Note    Patient Details  Name: Erica Ball MRN: [id number] Date of Birth: [birth year]  [facility]) CM/SW Contact:    Bridget Cordella Simmonds, LCSW Phone Number: 03/11/2024, 2:00 PM  Clinical Narrative:    CSW met with pt for initial assessment.  Pt confirms she has been in LTC at [place], does plan to return.  Permission given to speak with son Warren, siblings [name] and Narcissa.    ICM will continue to follow for DC needs.                Expected Discharge Plan: Skilled Nursing Facility Barriers to Discharge: Continued Medical Work up   Patient Goals and CMS Choice Patient states their goals for this hospitalization and ongoing recovery are:: go back to [place]   Choice offered to / list presented to : Patient (pt plans to return to [place])      Expected Discharge Plan and Services In-house Referral: Clinical Social Work   Post Acute Care Choice: Skilled Nursing Facility Living arrangements for the past 2 months: Skilled Biochemist, Clinical)                                      Prior Living Arrangements/Services Living arrangements for the past 2 months: Skilled Nursing Facility Teacher, Adult Education) Lives with:: Facility Resident Patient language and need for interpreter reviewed:: Yes Do you feel safe going back to the place where you live?: Yes      Need for Family Participation in Patient Care: Yes (Comment) Care giver support system in place?: Yes (comment) Current home services: Other (comment) (na) Criminal Activity/Legal Involvement Pertinent to Current Situation/Hospitalization: No - Comment as needed  Activities of Daily Living   ADL Screening (condition at time of admission) Independently performs ADLs?: No Does the patient have a NEW difficulty with bathing/dressing/toileting/self-feeding that is expected to last >3 days?: Yes (Initiates electronic notice to provider for possible OT  consult) Does the patient have a NEW difficulty with getting in/out of bed, walking, or climbing stairs that is expected to last >3 days?: Yes (Initiates electronic notice to provider for possible PT consult) Does the patient have a NEW difficulty with communication that is expected to last >3 days?: No Is the patient deaf or have difficulty hearing?: No Does the patient have difficulty seeing, even when wearing glasses/contacts?: No Does the patient have difficulty concentrating, remembering, or making decisions?: No  Permission Sought/Granted Permission sought to share information with : Family Supports Permission granted to share information with : Yes, Verbal Permission Granted  Share Information with NAME: son Warren, siblings: [name] and Technical Sales Engineer granted to share info w AGENCY: SNF        Emotional Assessment Appearance:: Appears stated age Attitude/Demeanor/Rapport: Engaged Affect (typically observed): Pleasant, Appropriate Orientation: : Oriented to Self, Oriented to Place, Oriented to  Time, Oriented to Situation      Admission diagnosis:  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] Lumbar compression fracture, closed, initial encounter (HCC) [S32.000A] Patient Active Problem List   Diagnosis Date Noted   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   T4 vertebral fracture (HCC) 01/18/2024   Protein-calorie malnutrition, severe 11/23/2022   Generalized weakness 11/21/2022   SVT (supraventricular tachycardia) 11/04/2022   Demand ischemia (HCC) 11/04/2022   Malnutrition of moderate degree 10/31/2022   Tibial plateau fracture 10/24/2022   Hyponatremia 10/24/2022   Hypomagnesemia 10/24/2022  Tibial plateau fracture, left, closed, initial encounter 10/23/2022   Fall at home, initial encounter 10/23/2022   COPD with acute exacerbation (HCC) 12/02/2017   Elevated troponin 07/10/2017   Pleural effusion on right 07/10/2017   UTI (urinary tract infection) 07/10/2017   Sepsis (HCC) 07/10/2017   Lobar  pneumonia 07/10/2017   Acute kidney injury 07/10/2017   Acute respiratory failure with hypoxia (HCC) 07/10/2017   Alcohol abuse    Tobacco abuse 08/20/2014   Hypertension 10/30/2011   Diverticulitis 10/30/2011   Vitamin D  deficiency 10/30/2011   PCP:  Macel Jayson PARAS, MD Pharmacy:  No Pharmacies Listed    Social Drivers of Health (SDOH) Social History: SDOH Screenings   Food Insecurity: No Food Insecurity (03/10/2024)  Housing: Unknown (03/10/2024)  Transportation Needs: Patient Declined (03/10/2024)  Utilities: Patient Declined (03/10/2024)  Alcohol Screen: Low Risk  (11/17/2022)  Depression (PHQ2-9): Low Risk  (11/17/2022)  Financial Resource Strain: Patient Unable To Answer (11/17/2022)  Physical Activity: Sufficiently Active (11/17/2022)  Social Connections: Socially Isolated (03/10/2024)  Stress: No Stress Concern Present (11/17/2022)  Tobacco Use: High Risk (03/09/2024)  Health Literacy: Inadequate Health Literacy (11/17/2022)   SDOH Interventions:     Readmission Risk Interventions    12/12/2022    4:34 PM  Readmission Risk Prevention Plan  Transportation Screening Complete  PCP or Specialist Appt within 3-5 Days Complete  HRI or Home Care Consult Complete  Social Work Consult for Recovery Care Planning/Counseling Complete  Palliative Care Screening Not Applicable  Medication Review Oceanographer) Complete

## 2024-03-11 NOTE — Evaluation (Signed)
 Occupational Therapy Evaluation Patient Details Name: MI BALLA MRN: [id number] DOB: [birth year] Today's Date: 03/11/2024   History of Present Illness   73 y.o. female presents to [facility] from SNF d/t low back pain and back spasms. Upon EMS arrival, Pt was hypoxic with O2 saturation in the 80s on RA, improved on 2 L nasal cannula and BP was significantly elevated 180/110. 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 Surgery consult with ?plans for hemicolectomy. PMH: COPD, empyema requiring VATS, chronic hypoxia on 2 L [place] as needed, SVT, tobacco abuse, HTN.     Clinical Impressions PTA Pt was independent with ADL tasks, Mod I for ambulation with rollator, and received IADL assistance at [facility]. Pt currently requiring up to Total A overall for bed mobility and ADL engagement. Pt is primarily limited by significant pain, generalized weakness, and decreased insight. OT to continue to follow Pt acutely to facilitate progress towards functional goals. Patient will benefit from continued inpatient follow up therapy, <3 hours/day.      If plan is discharge home, recommend the following:   Two people to help with walking and/or transfers;Two people to help with bathing/dressing/bathroom;Assistance with cooking/housework;Direct supervision/assist for medications management;Assist for transportation;Help with stairs or ramp for entrance     Functional Status Assessment   Patient has had a recent decline in their functional status and demonstrates the ability to make significant improvements in function in a reasonable and predictable amount of time.     Equipment Recommendations   Other (comment) (defer to next session)     Recommendations for Other Services         Precautions/Restrictions   Precautions Precautions: Fall Recall of Precautions/Restrictions:  Impaired Restrictions Weight Bearing Restrictions Per Provider Order: No     Mobility Bed Mobility Overal bed mobility: Needs Assistance Bed Mobility: Rolling Rolling: Total assist, +2 for physical assistance, Used rails         General bed mobility comments: Pt rolled R/L in bed. Cues for sequencing. She was able to bend knee opposite the side she was going and attempt to reach to bed rail, but would stop assisting once a wave a pain hit her. TotalA x2 with support at trunk/pelvis to roll pt into sidelying. Her linens were found to be soiled. Performed bed change. Repositioned pt with bed features. She declined to attempting sitting EOB d/t severe pain.    Transfers                   General transfer comment: Unable to attempt safe transfer OOB d/t significant pain      Balance Overall balance assessment: Needs assistance (session bed level)                                         ADL either performed or assessed with clinical judgement   ADL Overall ADL's : Needs assistance/impaired Eating/Feeding: Minimal assistance   Grooming: Moderate assistance;Bed level   Upper Body Bathing: Maximal assistance   Lower Body Bathing: Total assistance   Upper Body Dressing : Moderate assistance   Lower Body Dressing: Total assistance       Toileting- Clothing Manipulation and Hygiene: Total assistance Toileting - Clothing Manipulation Details (indicate cue type and reason): purewick       General ADL Comments: Pt limited to bed level  tasks d/t pain     Vision Patient Visual Report: No change from baseline       Perception         Praxis         Pertinent Vitals/Pain Pain Assessment Pain Assessment: 0-10 Pain Score: 10-Worst pain ever Pain Location: Back and Abdomen Pain Descriptors / Indicators: Grimacing, Moaning, Pressure Pain Intervention(s): Premedicated before session, Limited activity within patient's tolerance, Monitored during  session, Patient requesting pain meds-RN notified     Extremity/Trunk Assessment Upper Extremity Assessment Upper Extremity Assessment: Generalized weakness   Lower Extremity Assessment Lower Extremity Assessment: Defer to PT evaluation   Cervical / Trunk Assessment Cervical / Trunk Assessment: Other exceptions Cervical / Trunk Exceptions: Likely subacute 45% compression fracture along the superior endplate at L1 with 6 mm posterior bony retropulsion and mild sclerosis, involving anterior and middle columns without posterior column involvement. Mild disc bulge at L3-L4, L4-L5, and L5-S1 without impingement.   Communication Communication Communication: Impaired Factors Affecting Communication: Hearing impaired   Cognition Arousal: Alert Behavior During Therapy: Restless Cognition: Cognition impaired     Awareness: Intellectual awareness intact, Online awareness impaired     Executive functioning impairment (select all impairments): Problem solving, Reasoning OT - Cognition Comments: Pt demonstrates limited insight to deficits                 Following commands: Impaired Following commands impaired: Follows one step commands inconsistently     Cueing  General Comments   Cueing Techniques: Verbal cues;Gestural cues  Pt greeted on 4L O2 via [place]. Her SpO2 varied between 84-95%. Cued PLB technique.   Exercises     Shoulder Instructions      Home Living Family/patient expects to be discharged to:: Skilled nursing facility [facility])                             Home Equipment: Rollator (4 wheels);Shower seat   Additional Comments: Pt reports she last recieved therapy a couple weeks ago      Prior Functioning/Environment Prior Level of Function : Independent/Modified Independent;History of Falls (last six months)       Physical Assist : Mobility (physical);ADLs (physical) Mobility (physical): Gait ADLs (physical): IADLs Mobility Comments:  Ambulates using rollator. 2 falls recently. ADLs Comments: Indep with basic self-care. Relies on staff for all IADLs.    OT Problem List: Decreased strength;Decreased activity tolerance;Impaired balance (sitting and/or standing);Decreased safety awareness;Decreased knowledge of use of DME or AE;Decreased knowledge of precautions;Pain   OT Treatment/Interventions: Self-care/ADL training;Therapeutic exercise;Energy conservation;DME and/or AE instruction;Therapeutic activities;Patient/family education;Balance training      OT Goals(Current goals can be found in the care plan section)   Acute Rehab OT Goals Patient Stated Goal: to decrease pain OT Goal Formulation: With patient Time For Goal Achievement: 03/25/24 Potential to Achieve Goals: Fair ADL Goals Pt Will Perform Grooming: with set-up;sitting Pt Will Perform Upper Body Bathing: with min assist;sitting Pt Will Perform Lower Body Bathing: with mod assist;sitting/lateral leans Pt Will Perform Upper Body Dressing: with min assist;sitting Pt Will Perform Lower Body Dressing: with mod assist;sitting/lateral leans Additional ADL Goal #1: Pt will engage in bed mobility to sit EOB with Mod A as a precursor to ADL engagement OOB.   OT Frequency:  Min 2X/week    Co-evaluation PT/OT/SLP Co-Evaluation/Treatment: Yes Reason for Co-Treatment: Necessary to address cognition/behavior during functional activity;For patient/therapist safety PT goals addressed during session: Mobility/safety with mobility OT goals addressed during  session: ADL's and self-care;Strengthening/ROM      AM-PAC OT 6 Clicks Daily Activity     Outcome Measure Help from another person eating meals?: A Little Help from another person taking care of personal grooming?: A Lot Help from another person toileting, which includes using toliet, bedpan, or urinal?: Total Help from another person bathing (including washing, rinsing, drying)?: Total Help from another person  to put on and taking off regular upper body clothing?: A Lot Help from another person to put on and taking off regular lower body clothing?: Total 6 Click Score: 10   End of Session Equipment Utilized During Treatment: Oxygen Nurse Communication: Mobility status  Activity Tolerance: Patient limited by pain Patient left: in bed;with call bell/phone within reach;with bed alarm set  OT Visit Diagnosis: Muscle weakness (generalized) (M62.81);History of falling (Z91.81);Pain Pain - part of body:  (back and abdomen)                Time: 8867-8851 OT Time Calculation (min): 16 min Charges:  OT General Charges $OT Visit: 1 Visit OT Evaluation $OT Eval Moderate Complexity: 1 Mod  Maurilio CROME, OTR/L.  [facility]  Office: [phone]   Maurilio PARAS Vada Yellen 03/11/2024, 2:29 PM

## 2024-03-11 NOTE — Plan of Care (Signed)
  Problem: Clinical Measurements: Goal: Diagnostic test results will improve Outcome: Not Progressing   Problem: Activity: Goal: Risk for activity intolerance will decrease Outcome: Not Progressing   Problem: Coping: Goal: Level of anxiety will decrease Outcome: Not Progressing   Problem: Elimination: Goal: Will not experience complications related to bowel motility Outcome: Not Progressing   Problem: Pain Managment: Goal: General experience of comfort will improve and/or be controlled Outcome: Not Progressing   Problem: Skin Integrity: Goal: Risk for impaired skin integrity will decrease Outcome: Not Progressing   Problem: Safety: Goal: Ability to remain free from injury will improve Outcome: Not Progressing

## 2024-03-12 ENCOUNTER — Inpatient Hospital Stay (HOSPITAL_COMMUNITY)

## 2024-03-12 DIAGNOSIS — K388 Other specified diseases of appendix: Secondary | ICD-10-CM | POA: Diagnosis not present

## 2024-03-12 MED ORDER — GADOBUTROL 1 MMOL/ML IV SOLN
8.0000 mL | Freq: Once | INTRAVENOUS | Status: AC | PRN
  Administered 2024-03-12: 8 mL via INTRAVENOUS

## 2024-03-12 NOTE — Plan of Care (Signed)
  Problem: Education: Goal: Knowledge of General Education information will improve Description: Including pain rating scale, medication(s)/side effects and non-pharmacologic comfort measures Outcome: Progressing   Problem: Health Behavior/Discharge Planning: Goal: Ability to manage health-related needs will improve Outcome: Progressing   Problem: Clinical Measurements: Goal: Ability to maintain clinical measurements within normal limits will improve Outcome: Progressing   Problem: Nutrition: Goal: Adequate nutrition will be maintained Outcome: Progressing   Problem: Pain Managment: Goal: General experience of comfort will improve and/or be controlled Outcome: Progressing

## 2024-03-12 NOTE — Progress Notes (Signed)
 PROGRESS NOTE  Erica Ball  DOB: 1950-09-17  PCP: Macel Jayson PARAS, MD FMW:[id number]  DOA: 03/09/2024  LOS: 3 days  Hospital Day: 4  Subjective: Patient was seen and examined this morning. Lying down in bed.  Continues to complain of pain.   States she had a big bowel movement yesterday Afebrile, hemodynamically stable, breathing on 3 L oxygen  Brief narrative: Erica Ball is a 73 y.o. 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   Patient has been at [facility] as a long-term resident since May 2025.  Prior to that while living in the community, patient used to be a chronic smoker drinker.  11/3, patient was brought to the ED with complaint of lower back spasms not responding to tramadol .  No history of trauma or falls. For EMS, she was able to stand/walk to stretcher  In the ED, patient was afebrile, blood pressure elevated to 180s on 2 L oxygen Labs with WC count 12.4, renal function normal CT lumbar spine was obtained which showed - Likely subacute 45% compression fracture along the superior endplate at L1 with 6 mm posterior bony retropulsion and mild sclerosis, involving anterior and middle columns without posterior column involvement. - Left anterior flattening of the superior endplate of L4, unchanged from 06/06/14, suggesting a remote subtle L4 compression. - Mild disc bulge at L3-L4, L4-L5, and L5-S1 without impingement.  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 No acute inflammatory changes. Surgical consultation and excision with pathologic analysis is recommended to exclude neoplasm. -Cholelithiasis without cholecystitis. -Distal colonic diverticulosis without diverticulitis.  EDP discussed the case with general surgery  Given pain meds Admitted to [facility]  Assessment and plan: Acute on  chronic back pain Acute L1 compression fracture Remote L4 compression fracture Sent from [facility] for lower back spasms not responding to tramadol  Imagings as above showed acute as well as remote compression fractures  For pain control, patient is currently on scheduled Robaxin and as needed opioids.   Continues to complain of pain.  Will able to participate with therapy. I have requested IR to consider kyphoplasty. Continue fall precautions Pain regimen --- Scheduled: IV Robaxin 5 mg every 6 hours --- PRN: Tylenol  IV Dilaudid  0.5 every 4 hours, oral oxycodone  5 mg every 6 hours, Tylenol   Acute abdominal pain Complains of abdominal pain and also has mild diffuse tenderness on exam but inconsistent.  She is not tachycardic while complaining of abdominal pain. CT abdomen on admission did not show any acute pathology 11/5, x-ray abdomen showed mild colonic stool burden.  With bowel regimen, patient had a big bowel movement  Appendiceal mass CT abdomen findings as above with appendiceal mass  Per general surgery, the findings are concerning for underlying mucocele.  There is no acute inflammatory changes identified.  No current indication of emergent surgery.  Started on diet. General surgery has also put in a referral for pulmonology, cardiology and GI eval for risk stratification prior to surgery. I do not think her abdominal pain is related to the appendiceal mass.  Chronic diastolic CHF HTN H/o SVT Blood pressure elevated, partly because of pain as well.   Euvolemic on exam  PTA meds- Toprol  100 mg daily as well as Lopressor  25 mg nightly, amlodipine  5 mg daily, hydralazine  25 mg 3 times daily, torsemide 20 mg daily Currently on Toprol  100  mg daily, amlodipine  5 mg daily.  On hold are hydralazine , torsemide last 2D echo done on 10/30/2022 revealed LVEF 60-65% with grade 1 diastolic dysfunction. Continue to monitor on telemetry  COPD 80-pack-year smoking history Chronic daily  smoker Chronic hypoxic respiratory failure On 2 L nasal cannula as needed prior to admission Counseled to quit smoking Nicotine  patch offered Continue bronchodilators  HLD Crestor  10 mg daily   Mobility: Encouraged participate with PT/OT PT Orders: Active   PT Follow up Rec: Skilled Nursing-Short Term Rehab (<3 Hours/Day)03/11/2024 1300   Goals of care   Code Status: Full Code     DVT prophylaxis:  heparin injection 5,000 Units Start: 03/10/24 1400   Antimicrobials: None Fluid: None Consultants: None Family Communication: I called and discussed with patient's son yesterday on 11/5  Status: Inpatient Level of care:  Telemetry   Patient is from: Long-term resident at [facility] Needs to continue in-hospital care: Ongoing workup.  Needs IV pain medicines.  Possible kyphoplasty Anticipated d/c to: Hopefully back to facility 1 to 2 days   Diet:  Diet Order             Diet Heart Room service appropriate? Yes; Fluid consistency: Thin  Diet effective now                   Scheduled Meds:  amLODipine   5 mg Oral Daily   heparin  5,000 Units Subcutaneous Q8H   methocarbamol (ROBAXIN) injection  500 mg Intravenous Q6H   metoprolol  succinate  100 mg Oral q AM   rosuvastatin   10 mg Oral QPM   senna-docusate  1 tablet Oral BID    PRN meds: acetaminophen , HYDROmorphone  (DILAUDID ) injection, ipratropium-albuterol , melatonin, oxyCODONE , polyethylene glycol, prochlorperazine    Infusions:    Antimicrobials: Anti-infectives (From admission, onward)    None       Objective: Vitals:   03/12/24 0617 03/12/24 0746  BP: 137/61 128/67  Pulse: 75 67  Resp: 18 16  Temp: 98.2 F (36.8 C) (!) 97.1 F (36.2 C)  SpO2: 95% 93%    Intake/Output Summary (Last 24 hours) at 03/12/2024 1400 Last data filed at 03/12/2024 1300 Gross per 24 hour  Intake 0 ml  Output 201 ml  Net -201 ml   Filed Weights   03/09/24 1322 03/11/24 0500  Weight: 83.9 kg 83.3 kg   Weight  change:  Body mass index is 27.52 kg/m.   Physical Exam: General exam: Pleasant, elderly Caucasian female.  In distress from intermittent pain Skin: No rashes, lesions or ulcers. HEENT: Atraumatic, normocephalic, no obvious bleeding Lungs: Clear to auscultation bilaterally,  CVS: S1, S2, no murmur,   GI/Abd: Soft, mild distention present, tenderness inconsistent on exam, bowel sound present,   CNS: Alert, awake, oriented x 3 Psychiatry: Mood appropriate Extremities: No pedal edema, no calf tenderness,   Data Review: I have personally reviewed the laboratory data and studies available.  F/u labs ordered Unresulted Labs (From admission, onward)    None       Signed, Chapman Rota, MD [facility] 03/12/2024

## 2024-03-13 DIAGNOSIS — K388 Other specified diseases of appendix: Secondary | ICD-10-CM | POA: Diagnosis not present

## 2024-03-13 NOTE — Progress Notes (Signed)
 PT Cancellation Note  Patient Details Name: Erica Ball MRN: [id number] DOB: 10/20/1950   Cancelled Treatment:    Reason Eval/Treat Not Completed: Patient declined, no reason specified;Pain limiting ability to participate (Pt stated I don't think we can do it today. She is reporting 9/10 abdominal and back pain. Pt reports rolling this morning in order for linens to be changes as she was wet. Offered to reposition pt in bed, but she reported she was comfortable. Educated pt on the importance on engaging with therapy and moving multiple times a day to prevent deconditioning and reduce the effects of prolonged bed rest.) Notified RN of attempt for PT and encouraged use of maximove lift to transfer pt to recliner chair. Will continue to attempt as schedule permits.   Randall SAUNDERS, PT, DPT Acute Rehabilitation Services Office: [phone] Secure Chat Preferred  Erica Ball 03/13/2024, 3:02 PM

## 2024-03-13 NOTE — Care Management Important Message (Signed)
 Important Message  Patient Details  Name: Erica Ball MRN: [id number] Date of Birth: 1950-11-19   Important Message Given:  Yes - Medicare IM     Jon Cruel 03/13/2024, 4:16 PM

## 2024-03-13 NOTE — Progress Notes (Signed)
 Spoke with son about potential KP per patient request. Recommendations for conservative management and can consider L1 KP (candidate per Dr. Karalee based on anatomy) if unable to dc d/t pain. Son is interested in this procedure, though he understands that patient mobility may limit this option. She is total assist and unsure if she is able to lie on her stomach (has not tried in years).   Patient was agitated at time of visit to her room and did not attempt rolling into prone position with me. She is willing to try for a procedure.  Recommend IV tylenol  1000 mG and IM ketorolac  if renal function allows. IR will check back with team early next week. Typically, IR plans these procedure outpatient; however, patient is unable to transport to OP clinic from [facility]/needs more assistance than clinic staff can accommodate.   Considered a high bleeding risk procedure. No need to start a hold on SQ heparin at this time (will hold single dose if decision made to move forward), but restarting home [facility] would delay procedure.   Kardell Virgil NP 03/13/2024 4:29 PM

## 2024-03-13 NOTE — Progress Notes (Signed)
 PROGRESS NOTE  Erica Ball  DOB: May 12, 1950  PCP: Macel Jayson PARAS, MD FMW:[id number]  DOA: 03/09/2024  LOS: 4 days  Hospital Day: 5  Subjective: Patient was seen and examined this morning. Lying on bed.  Complains of lower back pain and abdominal pain. She says she does not want pills, needs pain meds via her IV   Afebrile, hemodynamically stable Repeat labs this morning unremarkable  Brief narrative: Erica Ball is a 73 y.o. 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   Patient has been at [facility] as a long-term resident since May 2025.  Prior to that while living in the community, patient used to be a chronic smoker drinker.  11/3, patient was brought to the ED with complaint of lower back spasms not responding to tramadol .  No history of trauma or falls. For EMS, she was able to stand/walk to stretcher  In the ED, patient was afebrile, blood pressure elevated to 180s on 2 L oxygen Labs with WC count 12.4, renal function normal CT lumbar spine was obtained which showed - Likely subacute 45% compression fracture along the superior endplate at L1 with 6 mm posterior bony retropulsion and mild sclerosis, involving anterior and middle columns without posterior column involvement. - Left anterior flattening of the superior endplate of L4, unchanged from 06/06/14, suggesting a remote subtle L4 compression. - Mild disc bulge at L3-L4, L4-L5, and L5-S1 without impingement.  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 No acute inflammatory changes. Surgical consultation and excision with pathologic analysis is recommended to exclude neoplasm. -Cholelithiasis without cholecystitis. -Distal colonic diverticulosis without diverticulitis.  EDP discussed the case with general surgery  Given pain  meds Admitted to [facility]  Assessment and plan: Acute on chronic back pain Acute L1 compression fracture Remote L4 compression fracture Sent from [facility] for lower back spasms not responding to tramadol  Imagings as above showed acute as well as remote compression fractures  For pain control, patient is currently on scheduled Robaxin and as needed opioids but still complains of significant pain.  She was unable to participate with physical therapy yesterday. I have requested IR to consider kyphoplasty. Continue fall precautions Pain regimen --- Scheduled: IV Robaxin 5 mg every 6 hours --- PRN: Tylenol  IV Dilaudid  0.5 every 4 hours, oral oxycodone  5 mg every 6 hours, Tylenol  If no improvement with pain meds, may need kyphoplasty  Acute abdominal pain Complains of abdominal pain since yesterday. On exam, has mild diffuse tenderness CT abdomen on admission did not show any acute pathology X-ray abdomen today showed mild colonic stool burden Given Dulcolax suppository earlier.  Will give 1 dose of MiraLAX  and start Senokot twice daily scheduled.  Appendiceal mass CT abdomen findings as above with appendiceal mass  Per general surgery, the findings are concerning for underlying mucocele.  There is no acute inflammatory changes identified.  No current indication of emergent surgery.  Started on diet. General surgery has also put in a referral for pulmonology, cardiology and GI eval for risk stratification prior to surgery. I do not think her abdominal pain is related to the appendiceal mass.  Chronic diastolic CHF HTN H/o SVT Blood pressure elevated, partly because of pain as well.   Euvolemic on exam  PTA meds- Toprol  100 mg daily as well as Lopressor  25 mg nightly, amlodipine  5 mg daily, hydralazine   25 mg 3 times daily, torsemide 20 mg daily Currently on Toprol  100 mg daily, amlodipine  5 mg daily.  On hold are hydralazine , torsemide last 2D echo done on 10/30/2022 revealed LVEF 60-65%  with grade 1 diastolic dysfunction. Continue to monitor on telemetry  COPD 80-pack-year smoking history Chronic daily smoker Chronic hypoxic respiratory failure On 2 L nasal cannula as needed prior to admission Counseled to quit smoking Nicotine  patch offered Continue bronchodilators  HLD Crestor  10 mg daily   Mobility: Encouraged participate with PT/OT PT Orders: Active   PT Follow up Rec: Skilled Nursing-Short Term Rehab (<3 Hours/Day)03/11/2024 1300   Goals of care   Code Status: Full Code     DVT prophylaxis:  heparin injection 5,000 Units Start: 03/10/24 1400   Antimicrobials: None Fluid: None Consultants: None Family Communication: I called and discussed with patient's son this afternoon.  Status: Inpatient Level of care:  Telemetry   Patient is from: Long-term resident at [facility] Needs to continue in-hospital care: Ongoing workup.  Needs IV pain medicines Anticipated d/c to: Hopefully back to facility 1 to 2 days   Diet:  Diet Order             Diet Heart Room service appropriate? Yes; Fluid consistency: Thin  Diet effective now                   Scheduled Meds:  amLODipine   5 mg Oral Daily   heparin  5,000 Units Subcutaneous Q8H   methocarbamol (ROBAXIN) injection  500 mg Intravenous Q6H   metoprolol  succinate  100 mg Oral q AM   rosuvastatin   10 mg Oral QPM   senna-docusate  1 tablet Oral BID    PRN meds: acetaminophen , HYDROmorphone  (DILAUDID ) injection, ipratropium-albuterol , melatonin, oxyCODONE , polyethylene glycol, prochlorperazine    Infusions:    Antimicrobials: Anti-infectives (From admission, onward)    None       Objective: Vitals:   03/13/24 0912 03/13/24 1300  BP:  126/66  Pulse:    Resp:    Temp:    SpO2: 92%     Intake/Output Summary (Last 24 hours) at 03/13/2024 1320 Last data filed at 03/13/2024 0600 Gross per 24 hour  Intake 840 ml  Output 1500 ml  Net -660 ml   Filed Weights   03/09/24 1322  03/11/24 0500  Weight: 83.9 kg 83.3 kg   Weight change:  Body mass index is 27.52 kg/m.   Physical Exam: General exam: Pleasant, elderly Caucasian female.  In distress from pain Skin: No rashes, lesions or ulcers. HEENT: Atraumatic, normocephalic, no obvious bleeding Lungs: Clear to auscultation bilaterally,  CVS: S1, S2, no murmur,   GI/Abd: Soft, mild diffuse tenderness, mild distention present, bowel sound present,   CNS: Alert, awake, oriented x 3 Psychiatry: Distressed and tearful from back pain Extremities: No pedal edema, no calf tenderness,   Data Review: I have personally reviewed the laboratory data and studies available.  F/u labs ordered Unresulted Labs (From admission, onward)    None       Signed, Landon FORBES Baller, MD [facility] 03/13/2024

## 2024-03-14 DIAGNOSIS — K388 Other specified diseases of appendix: Secondary | ICD-10-CM | POA: Diagnosis not present

## 2024-03-14 MED ORDER — ACETAMINOPHEN 10 MG/ML IV SOLN
1000.0000 mg | Freq: Four times a day (QID) | INTRAVENOUS | Status: AC
  Administered 2024-03-14 – 2024-03-15 (×4): 1000 mg via INTRAVENOUS
  Filled 2024-03-14 (×4): qty 100

## 2024-03-14 MED ORDER — KETOROLAC TROMETHAMINE 15 MG/ML IJ SOLN: 15.0000 mg | Freq: Four times a day (QID) | INTRAMUSCULAR | Status: AC | PRN

## 2024-03-14 NOTE — Progress Notes (Signed)
 PROGRESS NOTE  Erica Ball  DOB: 1951-04-25  PCP: Macel Jayson PARAS, MD FMW:[id number]  DOA: 03/09/2024  LOS: 5 days  Hospital Day: 6  Subjective: Patient was seen and examined this morning. Lying on bed.  Complains of lower back pain and abdominal pain.   Brief narrative: Erica Ball is a 73 y.o. 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   Patient has been at [facility] as a long-term resident since May 2025.  Prior to that while living in the community, patient used to be a chronic smoker drinker.  11/3, patient was brought to the ED with complaint of lower back spasms not responding to tramadol .  No history of trauma or falls. For EMS, she was able to stand/walk to stretcher  In the ED, patient was afebrile, blood pressure elevated to 180s on 2 L oxygen Labs with WC count 12.4, renal function normal CT lumbar spine was obtained which showed - Likely subacute 45% compression fracture along the superior endplate at L1 with 6 mm posterior bony retropulsion and mild sclerosis, involving anterior and middle columns without posterior column involvement. - Left anterior flattening of the superior endplate of L4, unchanged from 06/06/14, suggesting a remote subtle L4 compression. - Mild disc bulge at L3-L4, L4-L5, and L5-S1 without impingement.  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 No acute inflammatory changes. Surgical consultation and excision with pathologic analysis is recommended to exclude neoplasm. -Cholelithiasis without cholecystitis. -Distal colonic diverticulosis without diverticulitis.  EDP discussed the case with general surgery  Given pain meds Admitted to [facility]  Assessment and plan: Acute on chronic back pain Acute L1 compression fracture Remote L4 compression fracture Sent from  [facility] for lower back spasms not responding to tramadol  Imagings as above showed acute as well as remote compression fractures  For pain control, patient is currently on scheduled Robaxin and as needed opioids but still complains of significant pain.  She was unable to participate with physical therapy yesterday. I have requested IR to consider kyphoplasty. 11/7-discussed case with IR at this time do recommend conservative management and can consider L1 kyphoplasty if unable to discharge this weekend due to pain.  Patient is willing to try the procedure, IR recommends IV Tylenol  100 mg and IM ketorolac  if her renal function allows.  IR would reevaluate patient early next week. Continue fall precautions Pain regimen --- Scheduled: IV Robaxin 5 mg every 6 hours --- PRN: IV Tylenol  IV Dilaudid  0.5 every 4 hours, oral oxycodone  5 mg every 6 hours,    Acute abdominal pain Complains of abdominal pain since yesterday. On exam, has mild diffuse tenderness CT abdomen on admission did not show any acute pathology X-ray abdomen today showed mild colonic stool burden Given Dulcolax suppository earlier.  Will give 1 dose of MiraLAX  and start Senokot twice daily scheduled.  Appendiceal mass CT abdomen findings as above with appendiceal mass  Per general surgery, the findings are concerning for underlying mucocele.  There is no acute inflammatory changes identified.  No current indication of emergent surgery.  Started on diet. General surgery has also put in a referral for pulmonology, cardiology and GI eval for risk stratification prior to surgery. I do not think her abdominal pain is related to the appendiceal mass.  Chronic diastolic CHF HTN H/o SVT Blood pressure elevated, partly because of pain  as well.   Euvolemic on exam  PTA meds- Toprol  100 mg daily as well as Lopressor  25 mg nightly, amlodipine  5 mg daily, hydralazine  25 mg 3 times daily, torsemide 20 mg daily Currently on Toprol  100  mg daily, amlodipine  5 mg daily.  On hold are hydralazine , torsemide last 2D echo done on 10/30/2022 revealed LVEF 60-65% with grade 1 diastolic dysfunction. Continue to monitor on telemetry  COPD 80-pack-year smoking history Chronic daily smoker Chronic hypoxic respiratory failure On 2 L nasal cannula as needed prior to admission Counseled to quit smoking Nicotine  patch offered Continue bronchodilators  HLD Crestor  10 mg daily   Mobility: Encouraged participate with PT/OT PT Orders: Active   PT Follow up Rec: Skilled Nursing-Short Term Rehab (<3 Hours/Day)03/11/2024 1300   Goals of care   Code Status: Full Code     DVT prophylaxis:  heparin injection 5,000 Units Start: 03/10/24 1400   Antimicrobials: None Fluid: None Consultants: None Family Communication: I called and discussed with patient's son this afternoon.  Status: Inpatient Level of care:  Telemetry   Patient is from: Long-term resident at [facility] Needs to continue in-hospital care: Ongoing workup.  Needs IV pain medicines Anticipated d/c to: Hopefully back to facility 1 to 2 days   Diet:  Diet Order             Diet Heart Room service appropriate? Yes; Fluid consistency: Thin  Diet effective now                   Scheduled Meds:  amLODipine   5 mg Oral Daily   heparin  5,000 Units Subcutaneous Q8H   methocarbamol (ROBAXIN) injection  500 mg Intravenous Q6H   metoprolol  succinate  100 mg Oral q AM   rosuvastatin   10 mg Oral QPM   senna-docusate  1 tablet Oral BID    PRN meds: acetaminophen , HYDROmorphone  (DILAUDID ) injection, ipratropium-albuterol , melatonin, oxyCODONE , polyethylene glycol, prochlorperazine    Infusions:    Antimicrobials: Anti-infectives (From admission, onward)    None       Objective: Vitals:   03/14/24 0524 03/14/24 0823  BP: (!) 114/59 136/66  Pulse: 75 71  Resp: 16 18  Temp: 97.6 F (36.4 C) 98.1 F (36.7 C)  SpO2: 98% 97%    Intake/Output  Summary (Last 24 hours) at 03/14/2024 1055 Last data filed at 03/14/2024 0700 Gross per 24 hour  Intake --  Output 2850 ml  Net -2850 ml   Filed Weights   03/09/24 1322 03/11/24 0500  Weight: 83.9 kg 83.3 kg   Weight change:  Body mass index is 27.52 kg/m.   Physical Exam: General exam: Pleasant, elderly Caucasian female.  In distress from pain Skin: No rashes, lesions or ulcers. HEENT: Atraumatic, normocephalic, no obvious bleeding Lungs: Clear to auscultation bilaterally,  CVS: S1, S2, no murmur,   GI/Abd: Soft, mild diffuse tenderness, mild distention present, bowel sound present,   CNS: Alert, awake, oriented x 3 Psychiatry: Distressed and tearful from back pain Extremities: No pedal edema, no calf tenderness,   Data Review: I have personally reviewed the laboratory data and studies available.  F/u labs ordered Unresulted Labs (From admission, onward)    None       Signed, Landon FORBES Baller, MD [facility] 03/14/2024

## 2024-03-14 NOTE — Plan of Care (Signed)

## 2024-03-15 DIAGNOSIS — K388 Other specified diseases of appendix: Secondary | ICD-10-CM | POA: Diagnosis not present

## 2024-03-15 NOTE — Progress Notes (Signed)
 PROGRESS NOTE  Erica Ball  DOB: 1950/09/13  PCP: Macel Jayson PARAS, MD FMW:[id number]  DOA: 03/09/2024  LOS: 6 days  Hospital Day: 7  Subjective: Patient was seen and examined this morning. Lying on bed.  Complains of lower back pain and abdominal pain.   Brief narrative: Erica Ball is a 73 y.o. 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   Patient has been at [facility] as a long-term resident since May 2025.  Prior to that while living in the community, patient used to be a chronic smoker drinker.  11/3, patient was brought to the ED with complaint of lower back spasms not responding to tramadol .  No history of trauma or falls. For EMS, she was able to stand/walk to stretcher  In the ED, patient was afebrile, blood pressure elevated to 180s on 2 L oxygen Labs with WC count 12.4, renal function normal CT lumbar spine was obtained which showed - Likely subacute 45% compression fracture along the superior endplate at L1 with 6 mm posterior bony retropulsion and mild sclerosis, involving anterior and middle columns without posterior column involvement. - Left anterior flattening of the superior endplate of L4, unchanged from 06/06/14, suggesting a remote subtle L4 compression. - Mild disc bulge at L3-L4, L4-L5, and L5-S1 without impingement.  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 No acute inflammatory changes. Surgical consultation and excision with pathologic analysis is recommended to exclude neoplasm. -Cholelithiasis without cholecystitis. -Distal colonic diverticulosis without diverticulitis.  EDP discussed the case with general surgery  Given pain meds Admitted to [facility]  Assessment and plan: Acute on chronic back pain Acute L1 compression fracture Remote L4 compression fracture Sent from  [facility] SNF for lower back spasms not responding to tramadol  Imagings as above showed acute as well as remote compression fractures  For pain control, patient is currently on scheduled Robaxin and as needed opioids but still complains of significant pain.  She was unable to participate with physical therapy yesterday. I have requested IR to consider kyphoplasty. 11/7-discussed case with IR at this time do recommend conservative management and can consider L1 kyphoplasty if unable to discharge this weekend due to pain.  Patient is willing to try the procedure, IR recommends IV Tylenol  100 mg and IM ketorolac  if her renal function allows.  IR would reevaluate patient early next week. Continue fall precautions Pain regimen --- Scheduled: IV Robaxin 5 mg every 6 hours --- PRN: IV Dilaudid  0.5 every 4 hours, oral oxycodone  5 mg every 6 hours, IV Tylenol  100 mg and IM ketorolac     Acute abdominal pain Complains of abdominal pain  On exam, has mild diffuse tenderness CT abdomen on admission did not show any acute pathology X-ray abdomen today showed mild colonic stool burden Given Dulcolax suppository earlier.  Will give 1 dose of MiraLAX  and start Senokot twice daily scheduled.  Appendiceal mass CT abdomen findings as above with appendiceal mass  Per general surgery, the findings are concerning for underlying mucocele.  There is no acute inflammatory changes identified.  No current indication of emergent surgery.  Started on diet. General surgery has also put in a referral for pulmonology, cardiology and GI eval for risk stratification prior to surgery. I do not think her abdominal pain is related to the appendiceal mass.  Chronic diastolic CHF HTN H/o SVT Blood pressure elevated,  partly because of pain as well.   Euvolemic on exam  PTA meds- Toprol  100 mg daily as well as Lopressor  25 mg nightly, amlodipine  5 mg daily, hydralazine  25 mg 3 times daily, torsemide 20 mg daily Currently on  Toprol  100 mg daily, amlodipine  5 mg daily.  On hold are hydralazine , torsemide last 2D echo done on 10/30/2022 revealed LVEF 60-65% with grade 1 diastolic dysfunction. Continue to monitor on telemetry  COPD 80-pack-year smoking history Chronic daily smoker Chronic hypoxic respiratory failure On 2 L nasal cannula as needed prior to admission Counseled to quit smoking Nicotine  patch offered Continue bronchodilators  HLD Crestor  10 mg daily   Mobility: Encouraged participate with PT/OT PT Orders: Active   PT Follow up Rec: Skilled Nursing-Short Term Rehab (<3 Hours/Day)03/11/2024 1300   Goals of care   Code Status: Full Code     DVT prophylaxis:  heparin injection 5,000 Units Start: 03/10/24 1400   Antimicrobials: None Fluid: None Consultants: None Family Communication: I called and discussed with patient's son this afternoon.  Status: Inpatient Level of care:  Telemetry   Patient is from: Long-term resident at [facility] Needs to continue in-hospital care: Ongoing workup.  Needs IV pain medicines Anticipated d/c to: Hopefully back to facility 1 to 2 days   Diet:  Diet Order             Diet Heart Room service appropriate? Yes; Fluid consistency: Thin  Diet effective now                   Scheduled Meds:  amLODipine   5 mg Oral Daily   heparin  5,000 Units Subcutaneous Q8H   methocarbamol (ROBAXIN) injection  500 mg Intravenous Q6H   metoprolol  succinate  100 mg Oral q AM   rosuvastatin   10 mg Oral QPM   senna-docusate  1 tablet Oral BID    PRN meds: HYDROmorphone  (DILAUDID ) injection, ipratropium-albuterol , ketorolac , melatonin, oxyCODONE , polyethylene glycol, prochlorperazine    Infusions:    Antimicrobials: Anti-infectives (From admission, onward)    None       Objective: Vitals:   03/15/24 0506 03/15/24 0800  BP: 126/60 139/66  Pulse: 64 66  Resp: 15 16  Temp: 98.2 F (36.8 C) 98 F (36.7 C)  SpO2: 94% 90%    Intake/Output  Summary (Last 24 hours) at 03/15/2024 1145 Last data filed at 03/15/2024 1030 Gross per 24 hour  Intake 767.85 ml  Output 5300 ml  Net -4532.15 ml   Filed Weights   03/09/24 1322 03/11/24 0500  Weight: 83.9 kg 83.3 kg   Weight change:  Body mass index is 27.52 kg/m.   Physical Exam: General exam: Pleasant, elderly Caucasian female.  In distress from pain Skin: No rashes, lesions or ulcers. HEENT: Atraumatic, normocephalic, no obvious bleeding Lungs: Clear to auscultation bilaterally,  CVS: S1, S2, no murmur,   GI/Abd: Soft, mild diffuse tenderness, mild distention present, bowel sound present,   CNS: Alert, awake, oriented x 3 Psychiatry: Distressed and tearful from back pain Extremities: No pedal edema, no calf tenderness,   Data Review: I have personally reviewed the laboratory data and studies available.  F/u labs ordered Unresulted Labs (From admission, onward)    None       Signed, Landon FORBES Baller, MD [facility] 03/15/2024

## 2024-03-16 DIAGNOSIS — K388 Other specified diseases of appendix: Secondary | ICD-10-CM | POA: Diagnosis not present

## 2024-03-16 LAB — CBC WITH DIFFERENTIAL/PLATELET
Abs Immature Granulocytes: 0.02 K/uL (ref 0.00–0.07)
Basophils Absolute: 0.1 K/uL (ref 0.0–0.1)
Basophils Relative: 1 %
Eosinophils Absolute: 0.3 K/uL (ref 0.0–0.5)
Eosinophils Relative: 3 %
HCT: 39.2 % (ref 36.0–46.0)
Hemoglobin: 13.2 g/dL (ref 12.0–15.0)
Immature Granulocytes: 0 %
Lymphocytes Relative: 10 %
Lymphs Abs: 1.1 K/uL (ref 0.7–4.0)
MCH: 32.2 pg (ref 26.0–34.0)
MCHC: 33.7 g/dL (ref 30.0–36.0)
MCV: 95.6 fL (ref 80.0–100.0)
Monocytes Absolute: 0.9 K/uL (ref 0.1–1.0)
Monocytes Relative: 8 %
Neutro Abs: 8.7 K/uL — ABNORMAL HIGH (ref 1.7–7.7)
Neutrophils Relative %: 78 %
Platelets: 256 K/uL (ref 150–400)
RBC: 4.1 MIL/uL (ref 3.87–5.11)
RDW: 12.1 % (ref 11.5–15.5)
WBC: 11 K/uL — ABNORMAL HIGH (ref 4.0–10.5)
nRBC: 0 % (ref 0.0–0.2)

## 2024-03-16 LAB — COMPREHENSIVE METABOLIC PANEL WITH GFR
ALT: 17 U/L (ref 0–44)
AST: 22 U/L (ref 15–41)
Albumin: 2.9 g/dL — ABNORMAL LOW (ref 3.5–5.0)
Alkaline Phosphatase: 91 U/L (ref 38–126)
Anion gap: 13 (ref 5–15)
BUN: 6 mg/dL — ABNORMAL LOW (ref 8–23)
CO2: 26 mmol/L (ref 22–32)
Calcium: 9 mg/dL (ref 8.9–10.3)
Chloride: 95 mmol/L — ABNORMAL LOW (ref 98–111)
Creatinine, Ser: 0.63 mg/dL (ref 0.44–1.00)
GFR, Estimated: >60 mL/min (ref >60)
Glucose, Bld: 103 mg/dL — ABNORMAL HIGH (ref 70–99)
Potassium: 3.7 mmol/L (ref 3.5–5.1)
Sodium: 134 mmol/L — ABNORMAL LOW (ref 135–145)
Total Bilirubin: 0.9 mg/dL (ref 0.0–1.2)
Total Protein: 6.8 g/dL (ref 6.5–8.1)

## 2024-03-16 MED ORDER — HYDROMORPHONE HCL 1 MG/ML IJ SOLN
0.5000 mg | INTRAMUSCULAR | Status: DC | PRN
  Administered 2024-03-18 – 2024-03-20 (×4): 0.5 mg via INTRAVENOUS
  Filled 2024-03-16 (×5): qty 0.5

## 2024-03-16 MED ORDER — OXYCODONE-ACETAMINOPHEN 5-325 MG PO TABS
1.0000 | ORAL_TABLET | ORAL | Status: DC | PRN
  Administered 2024-03-16: 1 via ORAL
  Administered 2024-03-16 – 2024-03-22 (×14): 2 via ORAL
  Filled 2024-03-16 (×12): qty 2
  Filled 2024-03-16: qty 1
  Filled 2024-03-16 (×2): qty 2

## 2024-03-16 NOTE — Progress Notes (Signed)
 PROGRESS NOTE  OLUWANIFEMI PETITTI  DOB: 09/02/1950  PCP: Macel Jayson PARAS, MD FMW:[id number]  DOA: 03/09/2024  LOS: 7 days  Hospital Day: 8  Subjective: Patient was seen and examined this morning. Lying on bed.  Complains of lower back pain and abdominal pain.  She continues to request for IV pain medications.  She states that the pain is excruciating. Brief narrative: KEYAIRA CLAPHAM is a 73 y.o. 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   Patient has been at [facility] as a long-term resident since May 2025.  Prior to that while living in the community, patient used to be a chronic smoker drinker.  11/3, patient was brought to the ED with complaint of lower back spasms not responding to tramadol .  No history of trauma or falls. For EMS, she was able to stand/walk to stretcher  In the ED, patient was afebrile, blood pressure elevated to 180s on 2 L oxygen Labs with WC count 12.4, renal function normal CT lumbar spine was obtained which showed - Likely subacute 45% compression fracture along the superior endplate at L1 with 6 mm posterior bony retropulsion and mild sclerosis, involving anterior and middle columns without posterior column involvement. - Left anterior flattening of the superior endplate of L4, unchanged from 06/06/14, suggesting a remote subtle L4 compression. - Mild disc bulge at L3-L4, L4-L5, and L5-S1 without impingement.  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 No acute inflammatory changes. Surgical consultation and excision with pathologic analysis is recommended to exclude neoplasm. -Cholelithiasis without cholecystitis. -Distal colonic diverticulosis without diverticulitis.  EDP discussed the case with general surgery  Given pain meds Admitted to [facility]  Assessment and plan: Acute on  chronic back pain Acute L1 compression fracture Remote L4 compression fracture Sent from [facility] SNF for lower back spasms not responding to tramadol  Imagings as above showed acute as well as remote compression fractures  For pain control, patient is currently on scheduled Robaxin and as needed opioids but still complains of significant pain.  She was unable to participate with physical therapy yesterday. I have requested IR to consider kyphoplasty. 11/7-discussed case with IR at this time do recommend conservative management and can consider L1 kyphoplasty if unable to discharge this weekend due to pain.  Patient is willing to try the procedure, IR recommends IV Tylenol  100 mg and IM ketorolac  if her renal function allows.  IR would reevaluate patient today 11/10 Continue fall precautions Pain regimen --- Scheduled: IV Robaxin 5 mg every 6 hours --- PRN: IV Dilaudid  0.5 every 4 hours, oral oxycodone  5 mg every 6 hours, IV Tylenol  100 mg and IM ketorolac     Acute abdominal pain Complains of abdominal pain  On exam, has mild diffuse tenderness CT abdomen on admission did not show any acute pathology X-ray abdomen today showed mild colonic stool burden Given Dulcolax suppository earlier.  Will give 1 dose of MiraLAX  and start Senokot twice daily scheduled.  Appendiceal mass CT abdomen findings as above with appendiceal mass  Per general surgery, the findings are concerning for underlying mucocele.  There is no acute inflammatory changes identified.  No current indication of emergent surgery.  Started on diet. General surgery has also put in a referral for pulmonology, cardiology and GI eval for risk stratification prior to surgery. I do not think her abdominal pain is related  to the appendiceal mass.  Chronic diastolic CHF HTN H/o SVT Blood pressure elevated, partly because of pain as well.   Euvolemic on exam  PTA meds- Toprol  100 mg daily as well as Lopressor  25 mg nightly,  amlodipine  5 mg daily, hydralazine  25 mg 3 times daily, torsemide 20 mg daily Currently on Toprol  100 mg daily, amlodipine  5 mg daily.  On hold are hydralazine , torsemide last 2D echo done on 10/30/2022 revealed LVEF 60-65% with grade 1 diastolic dysfunction. Continue to monitor on telemetry  COPD 80-pack-year smoking history Chronic daily smoker Chronic hypoxic respiratory failure On 2 L nasal cannula as needed prior to admission Counseled to quit smoking Nicotine  patch offered Continue bronchodilators  HLD Crestor  10 mg daily   Mobility: Encouraged participate with PT/OT PT Orders: Active   PT Follow up Rec: Skilled Nursing-Short Term Rehab (<3 Hours/Day)03/11/2024 1300   Goals of care   Code Status: Full Code     DVT prophylaxis:  heparin injection 5,000 Units Start: 03/10/24 1400   Antimicrobials: None Fluid: None Consultants: None Family Communication: I called and discussed with patient's son this afternoon.  Status: Inpatient Level of care:  Telemetry   Patient is from: Long-term resident at [facility] Needs to continue in-hospital care: Ongoing workup.  Needs IV pain medicines Anticipated d/c to: Hopefully back to facility 1 to 2 days   Diet:  Diet Order             Diet Heart Room service appropriate? Yes; Fluid consistency: Thin  Diet effective now                   Scheduled Meds:  amLODipine   5 mg Oral Daily   heparin  5,000 Units Subcutaneous Q8H   methocarbamol (ROBAXIN) injection  500 mg Intravenous Q6H   metoprolol  succinate  100 mg Oral q AM   rosuvastatin   10 mg Oral QPM   senna-docusate  1 tablet Oral BID    PRN meds: HYDROmorphone  (DILAUDID ) injection, ipratropium-albuterol , melatonin, oxyCODONE -acetaminophen , polyethylene glycol, prochlorperazine    Infusions:    Antimicrobials: Anti-infectives (From admission, onward)    None       Objective: Vitals:   03/16/24 0749 03/16/24 0952  BP: (!) 123/97 (!) 123/97  Pulse:  85 85  Resp: 16   Temp: 98.9 F (37.2 C)   SpO2: 91%     Intake/Output Summary (Last 24 hours) at 03/16/2024 1152 Last data filed at 03/16/2024 0100 Gross per 24 hour  Intake --  Output 1600 ml  Net -1600 ml   Filed Weights   03/09/24 1322 03/11/24 0500  Weight: 83.9 kg 83.3 kg   Weight change:  Body mass index is 27.52 kg/m.   Physical Exam: General exam: Pleasant, elderly Caucasian female.  In distress from pain Skin: No rashes, lesions or ulcers. HEENT: Atraumatic, normocephalic, no obvious bleeding Lungs: Clear to auscultation bilaterally,  CVS: S1, S2, no murmur,   GI/Abd: Soft, mild diffuse tenderness, mild distention present, bowel sound present,   CNS: Alert, awake, oriented x 3 Psychiatry: Distressed and tearful from back pain Extremities: No pedal edema, no calf tenderness,   Data Review: I have personally reviewed the laboratory data and studies available.  F/u labs ordered Unresulted Labs (From admission, onward)     Start     Ordered   03/16/24 0803  Comprehensive metabolic panel  Once,   R        03/16/24 0802  Signed, Landon FORBES Baller, MD [facility] 03/16/2024

## 2024-03-16 NOTE — Progress Notes (Signed)
 Physical Therapy Treatment Patient Details Name: Erica Ball MRN: [id number] DOB: 1951-03-07 Today's Date: 03/16/2024   History of Present Illness 73 y.o. female presents to [facility] from SNF d/t low back pain and back spasms. Upon EMS arrival, Pt was hypoxic with O2 saturation in the 80s on RA, improved on 2 L nasal cannula and BP was significantly elevated 180/110. 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 Surgery consult with ?plans for hemicolectomy. PMH: COPD, empyema requiring VATS, chronic hypoxia on 2 L [place] as needed, SVT, tobacco abuse, HTN.    PT Comments  Pt resting in bed on arrival, pleasant and eager for OOB mobility with pt demonstrating good progress towards acute goals. Pt continues to be limited and internally distracted by pain throughout session, with pt often needing assurance and encouragement with all mobility. Pt able to complete bed mobility with up to mod A and step pivot with RW for support EOB>BSC. Pt requiring use of stedy for back to bed due to fatigue with pt demonstrating transfers sit<>stand from [facility] with mod A and from stedy pads with min A. Educated pt on importance of frequent mobilization to maximize functional mobility gains with pt verbalizing understanding. Pt continues to benefit from skilled PT services to progress toward functional mobility goals.      If plan is discharge home, recommend the following: Two people to help with walking and/or transfers;Two people to help with bathing/dressing/bathroom;Assistance with cooking/housework;Assist for transportation;Help with stairs or ramp for entrance   Can travel by private vehicle     No  Equipment Recommendations  Hospital bed;Hoyer lift;Wheelchair (measurements PT);Wheelchair cushion (measurements PT);BSC/3in1    Recommendations for Other Services       Precautions / Restrictions  Precautions Precautions: Fall Recall of Precautions/Restrictions: Impaired Restrictions Weight Bearing Restrictions Per Provider Order: No     Mobility  Bed Mobility Overal bed mobility: Needs Assistance Bed Mobility: Rolling, Sidelying to Sit, Sit to Sidelying Rolling: Contact guard assist Sidelying to sit: Min assist     Sit to sidelying: Mod assist General bed mobility comments: min A to manage LEs to and off EOB and then to elevate trunk to sitting, VCs for sequencing throughout. pt able to lower trunk to sidelying and requiring mod A to bring LEs up into bed at end of session    Transfers Overall transfer level: Needs assistance Equipment used: Rolling walker (2 wheels), Ambulation equipment used Transfers: Sit to/from Stand, Bed to chair/wheelchair/BSC Sit to Stand: Mod assist   Step pivot transfers: Mod assist       General transfer comment: pt standing with mod A from EOB and BSC, pt able to step pivot EOB>BSC with RW for support and mod A to maintain balance, hwoever utilized stedy for back to bed due to fatigue with pt stating she was unable to step feet with RW support on return, flexed posture throughotu Transfer via Lift Equipment: Stedy  Ambulation/Gait               General Gait Details: Unable to attempt   Stairs             Wheelchair Mobility     Tilt Bed    Modified Rankin (Stroke Patients Only)       Balance Overall balance assessment: Needs assistance Sitting-balance support: Bilateral upper extremity supported, Feet supported Sitting balance-Leahy Scale: Fair     Standing balance support: Bilateral upper extremity supported,  During functional activity Standing balance-Leahy Scale: Poor Standing balance comment: reliant on UE support                            Communication Communication Communication: Impaired Factors Affecting Communication: Hearing impaired  Cognition Arousal: Alert Behavior During  Therapy: Restless, Anxious   PT - Cognitive impairments: No family/caregiver present to determine baseline, Attention, Initiation, Sequencing, Problem solving, Safety/Judgement                       PT - Cognition Comments: Pt is currently fixated on her pain, internally distracted. Following commands: Impaired Following commands impaired: Follows one step commands inconsistently    Cueing Cueing Techniques: Verbal cues, Gestural cues  Exercises      General Comments        Pertinent Vitals/Pain Pain Assessment Pain Assessment: Faces Faces Pain Scale: Hurts little more Pain Location: Back and Abdomen Pain Descriptors / Indicators: Grimacing, Moaning, Pressure Pain Intervention(s): Premedicated before session, Monitored during session, Limited activity within patient's tolerance    Home Living                          Prior Function            PT Goals (current goals can now be found in the care plan section) Acute Rehab PT Goals Patient Stated Goal: Have less pain PT Goal Formulation: With patient Time For Goal Achievement: 03/25/24 Progress towards PT goals: Progressing toward goals    Frequency    Min 2X/week      PT Plan      Co-evaluation              AM-PAC PT 6 Clicks Mobility   Outcome Measure  Help needed turning from your back to your side while in a flat bed without using bedrails?: A Lot Help needed moving from lying on your back to sitting on the side of a flat bed without using bedrails?: A Lot Help needed moving to and from a bed to a chair (including a wheelchair)?: A Lot Help needed standing up from a chair using your arms (e.g., wheelchair or bedside chair)?: A Lot Help needed to walk in hospital room?: Total Help needed climbing 3-5 steps with a railing? : Total 6 Click Score: 10    End of Session   Activity Tolerance: Patient tolerated treatment well Patient left: in bed;with call bell/phone within  reach;with bed alarm set Nurse Communication: Mobility status PT Visit Diagnosis: Pain;Muscle weakness (generalized) (M62.81);Other abnormalities of gait and mobility (R26.89);Unsteadiness on feet (R26.81);Difficulty in walking, not elsewhere classified (R26.2) Pain - part of body:  (Back and Abdomen)     Time: 8875-8792 PT Time Calculation (min) (ACUTE ONLY): 43 min  Charges:    $Therapeutic Activity: 38-52 mins PT General Charges $$ ACUTE PT VISIT: 1 Visit                     Starlina Lapre R. PTA Acute Rehabilitation Services Office: [phone]   Therisa CHRISTELLA Boor 03/16/2024, 1:42 PM

## 2024-03-17 DIAGNOSIS — K388 Other specified diseases of appendix: Secondary | ICD-10-CM | POA: Diagnosis not present

## 2024-03-17 MED ORDER — METHOCARBAMOL 500 MG PO TABS
500.0000 mg | ORAL_TABLET | Freq: Three times a day (TID) | ORAL | Status: DC | PRN
Start: 2024-03-17 — End: 2024-03-22
  Administered 2024-03-17 – 2024-03-19 (×5): 500 mg via ORAL
  Filled 2024-03-17 (×5): qty 1

## 2024-03-17 NOTE — Plan of Care (Signed)
  Problem: Education: Goal: Knowledge of General Education information will improve Description: Including pain rating scale, medication(s)/side effects and non-pharmacologic comfort measures Outcome: Progressing   Problem: Clinical Measurements: Goal: Ability to maintain clinical measurements within normal limits will improve Outcome: Progressing   Problem: Activity: Goal: Risk for activity intolerance will decrease Outcome: Progressing   Problem: Coping: Goal: Level of anxiety will decrease Outcome: Progressing   Problem: Pain Managment: Goal: General experience of comfort will improve and/or be controlled Outcome: Progressing   Problem: Safety: Goal: Ability to remain free from injury will improve Outcome: Progressing

## 2024-03-17 NOTE — Consult Note (Signed)
 Chief Complaint: L1 Compression Fx  Referring Provider(s): Dr. Jillian  Supervising Physician: Jenna Hacker  Patient Status: [facility] - In-pt  History of Present Illness: FREDRICKA KOHRS is a 73 y.o. female with medical history significant for COPD, chronic hypoxia on 2 L nasal cannula as needed, SVT, tobacco abuse, hypertension who presented to ED from [facility] for back spasm/pain. She was found to have L1 compression fx on CT. Follow up MRI was recommended by IR, and the patient was ultimately deemed a candidate for KP as she has failed conservative pain management.   There has been some delay due to patient willingness to participate in exam/conversation about her care. Her family is willing to proceed, but the patient was not willing to attempt lying on her stomach as of Friday. After conversation this morning, patient is also willing to proceed.   She wears 2L O2 [place] as needed at baseline.   Patient tells me that pain hinders any movement for her but is worst with attempting transition from lying on side to sitting on edge of bed. Denies fever, chills, CP, sore throat, N/V, abd pain, blood in stool or urine, abnormal bruising. She endorses back pain.   Allergies Reviewed:  Dextromethorphan   Patient is Full Code  Past Medical History:  Diagnosis Date   Acute kidney injury 07/10/2017   Acute respiratory failure with hypoxia (HCC) 07/10/2017   Alcohol abuse    Allergy    Cataract    Diverticulitis 10/30/2011   Elevated troponin 07/10/2017   Hypertension    Lobar pneumonia 07/10/2017   Pleural effusion on right 07/10/2017   Sepsis (HCC) 07/10/2017   SVT (supraventricular tachycardia)    Tobacco abuse    UTI (urinary tract infection) 07/10/2017   Vitamin D  deficiency 10/30/2011    Past Surgical History:  Procedure Laterality Date   ABDOMINAL HYSTERECTOMY     CATARACT EXTRACTION     FRACTURE SURGERY     IR THORACENTESIS ASP PLEURAL SPACE W/IMG GUIDE  07/11/2017    VIDEO ASSISTED THORACOSCOPY (VATS)/EMPYEMA Right 07/12/2017   Procedure: right VIDEO ASSISTED THORACOSCOPY (VATS) for drainage of EMPYEMA and decortication;  Surgeon: Kerrin Elspeth BROCKS, MD;  Location: [facility] OR;  Service: Thoracic;  Laterality: Right;   VIDEO BRONCHOSCOPY N/A 07/12/2017   Procedure: VIDEO BRONCHOSCOPY;  Surgeon: Kerrin Elspeth BROCKS, MD;  Location: [facility] OR;  Service: Thoracic;  Laterality: N/A;      Medications: Prior to Admission medications   Medication Sig Start Date End Date Taking? Authorizing Provider  acetaminophen  (TYLENOL ) 325 MG tablet Take 650 mg by mouth every 6 (six) hours as needed (general discomfort).   Yes [provider]  albuterol  (PROVENTIL  HFA;VENTOLIN  HFA) 108 (90 Base) MCG/ACT inhaler Inhale 2 puffs into the lungs every 4 (four) hours as needed for shortness of breath or wheezing. 07/10/17  Yes [provider]  amLODipine  (NORVASC ) 5 MG tablet Take 5 mg by mouth daily.   Yes [provider]  aspirin  EC 81 MG tablet Take 1 tablet (81 mg total) by mouth daily. 12/06/17  Yes Delford Maude BROCKS, MD  bisacodyl  (DULCOLAX) 10 MG suppository Place 10 mg rectally daily as needed (constipation).   Yes [provider]  calcium  carbonate (OSCAL) 1500 (600 Ca) MG TABS tablet Take 600 mg of elemental calcium  by mouth daily.   Yes [provider]  cyanocobalamin  1000 MCG tablet Take 1 tablet (1,000 mcg total) by mouth daily. 11/28/22  Yes Leotis Bogus, MD  Emollient (BAG  BALM) OINT Apply 1 Application topically daily. Apply to lower legs   Yes [provider]  folic acid  (FOLVITE ) 1 MG tablet Take 1 tablet (1 mg total) by mouth daily. 11/05/22  Yes Patel, Poonamkumari J, MD  hydrALAZINE  (APRESOLINE ) 25 MG tablet Take 1 tablet (25 mg total) by mouth 3 (three) times daily. 01/20/24  Yes Fairy Frames, MD  hydrOXYzine  (ATARAX ) 25 MG tablet Take 25 mg by mouth every 6 (six) hours as needed for anxiety.   Yes [provider]  magnesium  hydroxide (MILK OF MAGNESIA) 400 MG/5ML suspension Take 30 mLs by mouth daily as needed (no bm in 3 days).   Yes [provider]  magnesium  oxide (MAG-OX) 400 (240 Mg) MG tablet Take 1 tablet (400 mg total) by mouth daily. 11/29/22  Yes Leotis Bogus, MD  meloxicam (MOBIC) 15 MG tablet Take 15 mg by mouth daily.   Yes [provider]  Menthol, Topical Analgesic, 4 % GEL Apply 1 application  topically every 8 (eight) hours as needed (joint pain).   Yes [provider]  metoprolol  succinate (TOPROL  XL) 100 MG 24 hr tablet Take 1 tablet (100 mg total) by mouth daily. Take with or immediately following a meal. Patient taking differently: Take 100 mg by mouth in the morning. 11/04/22 03/14/24 Yes Patel, Poonamkumari J, MD  metoprolol  tartrate (LOPRESSOR ) 25 MG tablet Take 25 mg by mouth every evening.   Yes [provider]  ondansetron  (ZOFRAN ) 4 MG tablet Take 1 tablet (4 mg total) by mouth every 6 (six) hours as needed for nausea. 12/17/22  Yes Regalado, Belkys A, MD  pantoprazole  (PROTONIX ) 40 MG tablet Take 1 tablet (40 mg total) by mouth daily. 11/29/22  Yes Leotis Bogus, MD  promethazine -dextromethorphan (PROMETHAZINE -DM) 6.25-15 MG/5ML syrup Take 5 mLs by mouth every 6 (six) hours as needed for cough.   Yes [provider]  rosuvastatin  (CRESTOR ) 10 MG tablet Take 1 tablet (10 mg total) by mouth daily. Patient taking differently: Take 10 mg by mouth every evening. 11/05/22  Yes Patel, Poonamkumari J, MD  sodium chloride  1 g tablet Take 1-2 g by mouth See admin instructions. Take 2g by mouth in the morning and at bedtime. Take 1g in the afternoon   Yes [provider]  Sodium Phosphates (ENEMA RE) Place 1 Dose rectally daily as needed (constipation not relieved by the dulcolax).   Yes [provider]  thiamine  (VITAMIN B-1) 100 MG tablet Take 1 tablet (100 mg total) by mouth daily. 11/05/22  Yes Patel, Poonamkumari J, MD   torsemide (DEMADEX) 20 MG tablet Take 20 mg by mouth daily.   Yes [provider]  traMADol  (ULTRAM ) 50 MG tablet Take 50 mg by mouth every 6 (six) hours as needed (back pain).   Yes [provider]  predniSONE  (DELTASONE ) 20 MG tablet Take 40 mg by mouth daily. 6 day course Patient not taking: Reported on 03/10/2024    [provider]     Family History  Problem Relation Age of Onset   Hypertension Mother    Heart disease Mother    Hypertension Father    Heart disease Father    Stroke Brother    Hypertension Brother    Heart disease Brother     Social History   Socioeconomic History   Marital status: Divorced    Spouse name: Not on file   Number of children: Not on file   Years of education: Not on file  Highest education level: Not on file  Occupational History   Not on file  Tobacco Use   Smoking status: Every Day    Current packs/day: 1.50    Average packs/day: 1.5 packs/day for 53.0 years (79.5 ttl pk-yrs)    Types: Cigarettes   Smokeless tobacco: Never  Substance and Sexual Activity   Alcohol use: Yes    Alcohol/week: 21.0 standard drinks of alcohol    Types: 21 Shots of liquor per week    Comment: drinks three cups of vodka per day   Drug use: No   Sexual activity: Not Currently  Other Topics Concern   Not on file  Social History Narrative   Not on file   Social Drivers of Health   Financial Resource Strain: Patient Unable To Answer (11/17/2022)   Overall Financial Resource Strain (CARDIA)    Difficulty of Paying Living Expenses: Patient unable to answer  Food Insecurity: No Food Insecurity (03/10/2024)   Hunger Vital Sign    Worried About Running Out of Food in the Last Year: Never true    Ran Out of Food in the Last Year: Never true  Transportation Needs: Patient Declined (03/10/2024)   PRAPARE - Transportation    Lack of Transportation (Medical): Patient declined    Lack of Transportation (Non-Medical): Patient declined   Physical Activity: Sufficiently Active (11/17/2022)   Exercise Vital Sign    Days of Exercise per Week: 7 days    Minutes of Exercise per Session: 30 min  Stress: No Stress Concern Present (11/17/2022)   [employer] of Occupational Health - Occupational Stress Questionnaire    Feeling of Stress : Only a little  Social Connections: Socially Isolated (03/10/2024)   Social Connection and Isolation Panel    Frequency of Communication with Friends and Family: More than three times a week    Frequency of Social Gatherings with Friends and Family: More than three times a week    Attends Religious Services: Never    Database Administrator or Organizations: No    Attends Banker Meetings: Never    Marital Status: Divorced     Review of Systems: A 12 point ROS discussed and pertinent positives are indicated in the HPI above.  All other systems are negative.    Vital Signs: BP 128/77   Pulse 61   Temp 97.6 F (36.4 C) (Axillary)   Resp 18   Ht 5' 8.5 (1.74 m)   Wt 175 lb 0.7 oz (79.4 kg)   SpO2 97%   BMI 26.23 kg/m     Physical Exam HENT:     Mouth/Throat:     Mouth: Mucous membranes are moist.     Pharynx: Oropharynx is clear.  Cardiovascular:     Rate and Rhythm: Normal rate and regular rhythm.     Pulses: Normal pulses.     Heart sounds: Normal heart sounds.  Pulmonary:     Effort: Pulmonary effort is normal.     Breath sounds: Normal breath sounds.     Comments: 2L [place] Musculoskeletal:     Comments: Point tenderness over L1 region  Skin:    General: Skin is warm and dry.  Neurological:     Mental Status: She is alert and oriented to person, place, and time.  Psychiatric:     Comments: Mood volatile      Imaging: MR Lumbar Spine W Wo Contrast Result Date: 03/13/2024 EXAM: MRI LUMBAR SPINE 03/12/2024 04:26:07 PM TECHNIQUE: Multiplanar multisequence MRI of  the lumbar spine was performed with and without the administration of intravenous contrast.  CONTRAST: 8 mL of Gadavist was administered. COMPARISON: CT lumbar spine 03/09/2024. CLINICAL HISTORY: Compression fracture, lumbar. FINDINGS: BONES AND ALIGNMENT: Normal alignment. L1 superior endplate fracture with approximately 30% height loss 4 mm of bony retropulsion. There is associated bone marrow edema, compatible with an acute or subacute fracture. SPINAL CORD: The conus terminates normally. SOFT TISSUES: Multiple renal cysts. No paraspinal mass. L1-L2: Above fracture with 4 mm of bony retropulsion. No spinal canal stenosis or neural foraminal narrowing. L2-L3: No significant disc herniation. No spinal canal stenosis or neural foraminal narrowing. L3-L4: Mild disc bulge and mild facet arthropathy. No significant stenosis. L4-L5: Mild disc bulge and right facet arthropathy. No significant canal or foraminal stenosis. L5-S1: Mild disc bulge and small annular fissure. Disc height loss and desiccation. Mild bilateral facet arthropathy, right greater than left. Mild right foraminal stenosis. No significant canal stenosis. IMPRESSION: 1. Acute or subacute L1 superior endplate fracture with approximately 30% height loss and 4 mm of bony retropulsion. No significant canal stenosis. Electronically signed by: Gilmore Molt MD 03/13/2024 02:54 AM EST RP Workstation: [id number]   DG Abd Portable 1V Result Date: 03/11/2024 EXAM: 1 VIEW XRAY OF THE ABDOMEN 03/11/2024 11:34:00 AM COMPARISON: Prior studies are available. CLINICAL HISTORY: Abdominal distension. FINDINGS: BOWEL: Nonobstructive bowel gas pattern. Mild amount of stool in the colon. SOFT TISSUES: No opaque urinary calculi. BONES: No acute osseous abnormality. IMPRESSION: 1. No acute abnormality in the abdomen. 2. Mild colonic stool burden. Electronically signed by: Lynwood Seip MD 03/11/2024 12:11 PM EST RP Workstation: [id number]   CT ABDOMEN PELVIS W CONTRAST Result Date: 03/09/2024 CLINICAL DATA:  Right lower quadrant mass on earlier lumbar spine CT,  low back pain EXAM: CT ABDOMEN AND PELVIS WITH CONTRAST TECHNIQUE: Multidetector CT imaging of the abdomen and pelvis was performed using the standard protocol following bolus administration of intravenous contrast. RADIATION DOSE REDUCTION: This exam was performed according to the departmental dose-optimization program which includes automated exposure control, adjustment of the mA and/or kV according to patient size and/or use of iterative reconstruction technique. CONTRAST:  75mL OMNIPAQUE  IOHEXOL  350 MG/ML SOLN COMPARISON:  03/09/2024 FINDINGS: Lower chest: No acute pleural or parenchymal lung disease. Hepatobiliary: Dependent calcified gallstone within the gallbladder without cholecystitis. Liver is unremarkable. No biliary duct dilation or choledocholithiasis. Pancreas: Unremarkable. No pancreatic ductal dilatation or surrounding inflammatory changes. Spleen: Normal in size without focal abnormality. Adrenals/Urinary Tract: Multiple subcentimeter renal cortical cysts do not require specific imaging follow-up. No urinary tract calculi or obstructive uropathy within either kidney. Nodular thickening of the left adrenal gland measuring 1.1 cm, measuring 110 HU on this study and 30 HU on the preceding unenhanced CT lumbar spine exam, likely adenoma. Right adrenal is unremarkable. The bladder is grossly normal. Stomach/Bowel: No bowel obstruction or ileus. There is a lobular 6.1 x 4.7 x 5.1 cm peripherally calcified cystic mass in the right lower quadrant, which appears contiguous with the appendix. Findings are concerning for underlying mucocele. There are no acute inflammatory changes identified. Surgical consultation is recommended. Diffuse diverticulosis throughout the distal colon without evidence of acute diverticulitis. Vascular/Lymphatic: Aortic atherosclerosis. No enlarged abdominal or pelvic lymph nodes. Reproductive: Status post hysterectomy. No adnexal masses. Other: No free fluid or free  intraperitoneal gas. No abdominal wall hernia. Musculoskeletal: No acute or destructive bony abnormalities. L1 compression deformity is again noted, please see preceding CT lumbar spine for full description of lumbar spine findings. Reconstructed images demonstrate  no additional findings. IMPRESSION: 1. 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 No acute inflammatory changes. Surgical consultation and excision with pathologic analysis is recommended to exclude neoplasm. 2. Cholelithiasis without cholecystitis. 3. Distal colonic diverticulosis without diverticulitis. 4. L1 compression fracture. Please see preceding CT lumbar spine report for complete lumbar spine findings. 5.  Aortic Atherosclerosis (ICD10-I70.0). Electronically Signed   By: Ozell Daring M.D.   On: 03/09/2024 18:22   CT Lumbar Spine Wo Contrast Result Date: 03/09/2024 EXAM: CT OF THE LUMBAR SPINE WITHOUT CONTRAST 03/09/2024 02:42:00 PM TECHNIQUE: CT of the lumbar spine was performed without the administration of intravenous contrast. Multiplanar reformatted images are provided for review. Automated exposure control, iterative reconstruction, and/or weight based adjustment of the mA/kV was utilized to reduce the radiation dose to as low as reasonably achievable. COMPARISON: Lumbar spine radiographs 06/06/2014. CLINICAL HISTORY: Low back pain, increased fracture risk. FINDINGS: BONES AND ALIGNMENT: Likely subacute 45 percent compression fracture along the superior endplate at L1 with 6 mm posterior bony aortic pulsion and mild sclerosis along the superior endplate compression. Anterior and middle column involvement noted with no posterior column involvement identified. Left anterior flattening of the superior endplate of L4 not changed from 06/06/2014 suggesting a remote subtle L4 compression. Bony demineralization. Normal alignment. DEGENERATIVE CHANGES:  Mild disc bulge is present at L3-L4, L4-L5, and L5-S1 without impingement. SOFT TISSUES: No significant paraspinal edema. LUNGS: Scarring or subsegmental atelectasis in both lower lobes. VASCULATURE: Systemic atherosclerosis is present, including the aorta and iliac arteries. GALLBLADDER: Cholelithiasis. KIDNEYS, URETERS, AND BLADDER: 0.7 cm Bosniak category 2 benign but complex cyst of the left kidney upper pole with internal density 96 Hounsfield units on image 17 series 7. No further imaging workup of this lesion is indicated. STOMACH AND BOWEL: Sigmoid colon diverticulosis. Abnormal fluid density lesion with rim calcification measuring at least 5.7 cm in diameter in the right lower quadrant near the cecum on image 89 series 7, only partially included on today's exam. The appearance is nonspecific and could include mass, Meckel diverticulum, appendiceal mucocele, duplication cyst, or other abnormalities. Dedicated CT of the abdomen and pelvis with contrast is recommended for better assessment. IMPRESSION: 1. Likely subacute 45% compression fracture along the superior endplate at L1 with 6 mm posterior bony retropulsion and mild sclerosis, involving anterior and middle columns without posterior column involvement. 2. Abnormal rim-calcified fluid-density lesion at least 5.7 cm in diameter in the right lower quadrant near the cecum, only partially included; recommend dedicated contrast-enhanced CT of the abdomen and pelvis for further assessment. 3. Left anterior flattening of the superior endplate of L4, unchanged from 06/06/14, suggesting a remote subtle L4 compression. 4. Mild disc bulge at L3-L4, L4-L5, and L5-S1 without impingement. 5. Atherosclerosis. 6. Cholelithiasis. Electronically signed by: Ryan Salvage MD 03/09/2024 03:00 PM EST RP Workstation: [id number]    Labs:  CBC: Recent Labs    01/19/24 1125 03/09/24 1339 03/10/24 0521 03/16/24 0928  WBC 6.5 12.4* 11.6* 11.0*  HGB 12.6 14.4 13.8  13.2  HCT 39.8 44.5 42.5 39.2  PLT 214 266 233 256    COAGS: No results for input(s): INR, APTT in the last 8760 hours.  BMP: Recent Labs    01/20/24 0419 03/09/24 1339 03/10/24 0521 03/16/24 0928  NA 134* 139 138 134*  K 4.3 3.9 3.9 3.7  CL 96* 96* 95* 95*  CO2 28 29 34* 26  GLUCOSE 165* 138* 108* 103*  BUN  20 12 14  6*  CALCIUM  8.7* 8.5* 8.6* 9.0  CREATININE 0.82 0.81 0.74 0.63  GFRNONAA >60 >60 >60 >60    LIVER FUNCTION TESTS: Recent Labs    01/19/24 1125 03/16/24 0928  BILITOT 0.8 0.9  AST 19 22  ALT 18 17  ALKPHOS 51 91  PROT 6.5 6.8  ALBUMIN 3.5 2.9*    TUMOR MARKERS: No results for input(s): AFPTM, CEA, CA199, CHROMGRNA in the last 8760 hours.  Assessment and Plan:  Request for  image guided L1 vertebroplasty and/or kyphoplasty approved by Dr. Jenna on Friday under general anesthesia, tentative 11am. NPO MN prior.  No contraindications for procedure identified in ROS, physical exam, or review of pre-sedation considerations. Labs reviewed and within acceptable range 03/12/24 MR lumbar imaging available and reviewed VSS, afebrile Will hold 1 dose SQ heparin prior to planned 11am procedure.    Risks and benefits of image guided VP/KP were discussed with the patient including, but not limited to education regarding the natural healing process of compression fractures without intervention, bleeding, infection, cement migration which may cause spinal cord damage, paralysis, pulmonary embolism or even death.  This interventional procedure involves the use of X-rays and because of the nature of the planned procedure, it is possible that we will have prolonged use of X-ray fluoroscopy.  Potential radiation risks to you include (but are not limited to) the following: - A slightly elevated risk for cancer several years later in life. This risk is typically less than 0.5% percent. This risk is low in comparison to the normal incidence of human cancer,  which is 33% for women and 50% for men according to the [employer] Society. - Radiation induced injury can include skin redness, resembling a rash, tissue breakdown / ulcers and hair loss (which can be temporary or permanent).   The likelihood of either of these occurring depends on the difficulty of the procedure and whether you are sensitive to radiation due to previous procedures, disease, or genetic conditions.   IF your procedure requires a prolonged use of radiation, you will be notified and given written instructions for further action.  It is your responsibility to monitor the irradiated area for the 2 weeks following the procedure and to notify your physician if you are concerned that you have suffered a radiation induced injury.    All of the patient's questions were answered, patient is agreeable to proceed.  Consent signed and in chart.    Thank you for allowing our service to participate in NIKKOLE PLACZEK 's care.    Electronically Signed: Laymon Coast, NP   03/17/2024, 10:21 AM     I spent a total of 40 Minutes    in face to face in clinical consultation, greater than 50% of which was counseling/coordinating care for image guided KP/VP.   (A copy of this note was sent to the referring provider and the time of visit.)

## 2024-03-17 NOTE — Progress Notes (Signed)
 PROGRESS NOTE  KEYSHA Ball  FMW:[id number] DOB: 04/15/1951 DOA: 03/09/2024 PCP: Macel Jayson PARAS, MD   Brief Narrative: Patient is a 61 female with history of hypertension, SVT, COPD chronically on 2 L of oxygen at home who presented from skilled nursing facility with complaint of lower back spasm/pain, unable to ambulate.  CT lumbar spine showed subacute 45% compression fracture along the superior endplate at L1 with a 6 mm posterior bony retropulsion and mild sclerosis.  IR consulted for kyphoplasty.  Waiting for decision from IR about kyphoplasty.  Assessment & Plan:  Principal Problem:   Mass of appendix   Acute on chronic back pain/acute L1 compression fracture: Presented with back spasm, pain, unable to ambulate.  Has history of remote L4 compression fracture.  Did not respond to pain medication at her nursing facility.  IR was consulted for kyphoplasty but recommended conservative management.  Pain persist.  Patient is willing to try the procedure.  Currently on Robaxin, Dilaudid , oxycodone , .  Will follow-up with IR about kyphoplasty plan.  Abdominal pain/appendiceal mass: CT abdomen/pelvis showed appendiceal mass.  General surgery was consulted.  Findings were concerning for mucocele, no inflammatory changes identified.  No indication for emergent surgical.  Continue bowel regimen.  Denies any abdomen pain, nausea or vomiting today.  Tolerating regular diet  Chronic diastolic CHF:.  Continue current medication.  Currently euvolemic.  Monitor input/output, daily weight.  2D echo on 10/30/22 had shown EF of 60 to 65%, grade 1 diastolic dysfunction  History of hypertension/SVT: On Toprol , amlodipine , hydralazine , torsemide at home.  Currently on Toprol  and amlodipine  only.  COPD/chronic smoker/chronic hypoxic respiratory failure: Has 80-pack-year history of smoking.  Counseled for cessation.  On nicotine  patch.  Continue bronchodilators as needed. Not in COPD exacerbation at the  moment.  Chronically on 2 L of oxygen at home.  HyperLipidemia: On Crestor   Deconditioning/debility: PT/OT consulted.  Recommending discharge back to SNF when ready          DVT prophylaxis:heparin injection 5,000 Units Start: 03/10/24 1400     Code Status: Full Code  Family Communication: None at bedside  Patient status:Inpatient  Patient is from :SNF  Anticipated discharge to:SNF  Estimated DC date: Awaiting from IR evaluation for kyphoplasty   Consultants: IR  Procedures: None  Antimicrobials:  Anti-infectives (From admission, onward)    None       Subjective: Patient seen and examined at bedside today.  Lying on bed.  Complains of severe back pain.  Rates the pain as 10/10.  IR just saw her today.  She wants IR to discuss with her son about the plan for kyphoplasty.  She denies any abdominal pain,nausea or vomiting.  She wants to upgrade the diet to regular.  Objective: Vitals:   03/16/24 2108 03/17/24 0407 03/17/24 0500 03/17/24 0631  BP: (!) 150/74 (!) 97/52  (!) 135/58  Pulse: 80 (!) 56  61  Resp: 19 18    Temp: (!) 97.5 F (36.4 C) 97.6 F (36.4 C)    TempSrc: Oral Axillary    SpO2: 96% 97%    Weight:   79.4 kg   Height:        Intake/Output Summary (Last 24 hours) at 03/17/2024 0757 Last data filed at 03/17/2024 0647 Gross per 24 hour  Intake 530 ml  Output 800 ml  Net -270 ml   Filed Weights   03/09/24 1322 03/11/24 0500 03/17/24 0500  Weight: 83.9 kg 83.3 kg 79.4 kg    Examination:  General exam: Overall comfortable, not in distress,pleasant female  HEENT: PERRL Respiratory system:  no wheezes or crackles  Cardiovascular system: S1 & S2 heard, RRR.  Gastrointestinal system: Abdomen is nondistended, soft and nontender. Central nervous system: Alert and oriented Extremities: No edema, no clubbing ,no cyanosis Skin: No rashes, no ulcers,no icterus     Data Reviewed: I have personally reviewed following labs and imaging  studies  CBC: Recent Labs  Lab 03/16/24 0928  WBC 11.0*  NEUTROABS 8.7*  HGB 13.2  HCT 39.2  MCV 95.6  PLT 256   Basic Metabolic Panel: Recent Labs  Lab 03/16/24 0928  NA 134*  K 3.7  CL 95*  CO2 26  GLUCOSE 103*  BUN 6*  CREATININE 0.63  CALCIUM  9.0     No results found for this or any previous visit (from the past 240 hours).   Radiology Studies: No results found.  Scheduled Meds:  amLODipine   5 mg Oral Daily   heparin  5,000 Units Subcutaneous Q8H   methocarbamol (ROBAXIN) injection  500 mg Intravenous Q6H   metoprolol  succinate  100 mg Oral q AM   rosuvastatin   10 mg Oral QPM   senna-docusate  1 tablet Oral BID   Continuous Infusions:   LOS: 8 days   Ivonne Mustache, MD [facility] P11/03/2024, 7:57 AM

## 2024-03-18 DIAGNOSIS — K388 Other specified diseases of appendix: Secondary | ICD-10-CM | POA: Diagnosis not present

## 2024-03-18 NOTE — Progress Notes (Signed)
 PROGRESS NOTE  VERSA CRATON  FMW:[id number] DOB: 10-11-1950 DOA: 03/09/2024 PCP: Macel Jayson PARAS, MD   Brief Narrative: Patient is a 23 female with history of hypertension, SVT, COPD chronically on 2 L of oxygen at home who presented from skilled nursing facility with complaint of lower back spasm/pain, unable to ambulate.  CT lumbar spine showed subacute 45% compression fracture along the superior endplate at L1 with a 6 mm posterior bony retropulsion and mild sclerosis.  IR consulted for kyphoplasty.   IR planning to do kyphoplasty on Friday.  Assessment & Plan:  Principal Problem:   Mass of appendix   Acute on chronic back pain/acute L1 compression fracture: Presented with back spasm, pain, unable to ambulate.  Has history of remote L4 compression fracture.  Did not respond to pain medication at her nursing facility.  IR was consulted for kyphoplasty but initially recommended conservative management.  Pain persisted.   Currently on Robaxin, Dilaudid , oxycodone .   IR planning to do kyphoplasty on Friday.  Abdominal pain/appendiceal mass: CT abdomen/pelvis showed appendiceal mass.  General surgery was consulted.  Findings were concerning for mucocele, no inflammatory changes identified.  No indication for emergent surgical.  Continue bowel regimen.  Denies any abdomen pain, nausea or vomiting today.  Tolerating regular diet  Chronic diastolic CHF:.  Continue current medication.  Currently euvolemic.  Monitor input/output, daily weight.  2D echo on 10/30/22 had shown EF of 60 to 65%, grade 1 diastolic dysfunction  History of hypertension/SVT: On Toprol , amlodipine , hydralazine , torsemide at home.  Currently on Toprol  and amlodipine  only.  COPD/chronic smoker/chronic hypoxic respiratory failure: Has 80-pack-year history of smoking.  Counseled for cessation.  On nicotine  patch.  Continue bronchodilators as needed. Not in COPD exacerbation at the moment.  Chronically on 2 L of oxygen at  home.  HyperLipidemia: On Crestor   Deconditioning/debility: PT/OT consulted.  Recommending discharge back to SNF when ready          DVT prophylaxis:heparin injection 5,000 Units Start: 03/10/24 1400     Code Status: Full Code  Family Communication: None at bedside  Patient status:Inpatient  Patient is from :SNF  Anticipated discharge to:SNF  Estimated DC date: Awaiting kyphoplasty   Consultants: IR  Procedures: None  Antimicrobials:  Anti-infectives (From admission, onward)    None       Subjective: Patient seen and examined at bedside today.  Comfortably lying in bed.  Sleepy/drowsy when I arrived.  She says her back pain is well-controlled when she is on bed and not moving.  Not in any acute distress.  No new complaints  Objective: Vitals:   03/17/24 1917 03/18/24 0420 03/18/24 0753 03/18/24 0901  BP: 129/64 115/60 (!) 94/59 113/73  Pulse: 71 69 74   Resp: 18  17   Temp: (!) 97 F (36.1 C) 97.8 F (36.6 C)  (!) 97.5 F (36.4 C)  TempSrc:    Axillary  SpO2: 91% 93% 92% 94%  Weight:      Height:        Intake/Output Summary (Last 24 hours) at 03/18/2024 1128 Last data filed at 03/18/2024 0600 Gross per 24 hour  Intake 120 ml  Output 400 ml  Net -280 ml   Filed Weights   03/09/24 1322 03/11/24 0500 03/17/24 0500  Weight: 83.9 kg 83.3 kg 79.4 kg    Examination:  General exam: Overall comfortable, not in distress,pleasant female  HEENT: PERRL Respiratory system:  no wheezes or crackles  Cardiovascular system: S1 & S2 heard,  RRR.  Gastrointestinal system: Abdomen is nondistended, soft and nontender. Central nervous system: Alert and oriented Extremities: No edema, no clubbing ,no cyanosis Skin: No rashes, no ulcers,no icterus     Data Reviewed: I have personally reviewed following labs and imaging studies  CBC: Recent Labs  Lab 03/16/24 0928  WBC 11.0*  NEUTROABS 8.7*  HGB 13.2  HCT 39.2  MCV 95.6  PLT 256   Basic Metabolic  Panel: Recent Labs  Lab 03/16/24 0928  NA 134*  K 3.7  CL 95*  CO2 26  GLUCOSE 103*  BUN 6*  CREATININE 0.63  CALCIUM  9.0     No results found for this or any previous visit (from the past 240 hours).   Radiology Studies: No results found.  Scheduled Meds:  amLODipine   5 mg Oral Daily   heparin  5,000 Units Subcutaneous Q8H   metoprolol  succinate  100 mg Oral q AM   rosuvastatin   10 mg Oral QPM   senna-docusate  1 tablet Oral BID   Continuous Infusions:   LOS: 9 days   Ivonne Mustache, MD [facility] P11/04/2024, 11:28 AM

## 2024-03-18 NOTE — Progress Notes (Signed)
 Occupational Therapy Treatment Patient Details Name: Erica Ball MRN: [id number] DOB: 21-May-1950 Today's Date: 03/18/2024   History of present illness 73 y.o. female presents to [facility] from SNF d/t low back pain and back spasms. Upon EMS arrival, Pt was hypoxic with O2 saturation in the 80s on RA, improved on 2 L nasal cannula and BP was significantly elevated 180/110. 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 Surgery consult with ?plans for hemicolectomy. PMH: COPD, empyema requiring VATS, chronic hypoxia on 2 L [place] as needed, SVT, tobacco abuse, HTN.   OT comments  Pt is progressing towards goals. Focus of session on progressing functional transfer abilities and increasing engagement in ADL tasks. Pt required Max A for bed mobility this date. Pt provided with set-up A for facial grooming bed level, continues to require up to Max A for ADL task engagement. Pt continues to be limited by pain and decreased activity tolerance. OT to continue per POC. Current d/c recommendation remains appropriate.       If plan is discharge home, recommend the following:  Two people to help with walking and/or transfers;Two people to help with bathing/dressing/bathroom;Assistance with cooking/housework;Direct supervision/assist for medications management;Assist for transportation;Help with stairs or ramp for entrance   Equipment Recommendations  Other (comment) (defer)    Recommendations for Other Services      Precautions / Restrictions Precautions Precautions: Fall Recall of Precautions/Restrictions: Impaired Precaution/Restrictions Comments: Back precautions followed for safety, no formal orders Restrictions Weight Bearing Restrictions Per Provider Order: No       Mobility Bed Mobility Overal bed mobility: Needs Assistance Bed Mobility: Rolling, Sidelying to Sit, Sit to Sidelying Rolling: Max  assist, +2 for physical assistance Sidelying to sit: Max assist, +2 for physical assistance     Sit to sidelying: Max assist, +2 for physical assistance General bed mobility comments: Pt with increased pain this date, required Max A +2 for bed mobility.    Transfers                   General transfer comment: Unable to attempt this date d/t pain.     Balance Overall balance assessment: Needs assistance Sitting-balance support: Bilateral upper extremity supported, Feet supported Sitting balance-Leahy Scale: Fair                                     ADL either performed or assessed with clinical judgement   ADL Overall ADL's : Needs assistance/impaired     Grooming: Wash/dry face;Set up;Bed level                                 General ADL Comments: Pt unable to engage in ADL tasks seated EOB d/t pain and lethargy.    Extremity/Trunk Assessment Upper Extremity Assessment Upper Extremity Assessment: Generalized weakness            Vision       Perception     Praxis     Communication Communication Communication: Impaired Factors Affecting Communication: Hearing impaired   Cognition Arousal: Lethargic Behavior During Therapy: Anxious, Restless Cognition: Cognition impaired     Awareness: Intellectual awareness intact, Online awareness impaired     Executive functioning impairment (select all impairments): Problem solving, Reasoning, Sequencing OT - Cognition Comments: Pt demonstrates limited insight  to deficits and reasoning.                 Following commands: Impaired Following commands impaired: Follows one step commands inconsistently      Cueing   Cueing Techniques: Verbal cues, Gestural cues  Exercises      Shoulder Instructions       General Comments Pt greeted without [place] in place. SpO2 assessed to be 75%. [place] placed back onto Pt at 3L with return of SpO2 to 93%.    Pertinent Vitals/ Pain        Pain Assessment Pain Assessment: 0-10 Pain Score: 9  Pain Location: Back and Abdomen Pain Descriptors / Indicators: Discomfort, Grimacing, Guarding, Moaning Pain Intervention(s): Limited activity within patient's tolerance, Monitored during session, Repositioned, Patient requesting pain meds-RN notified  Home Living                                          Prior Functioning/Environment              Frequency  Min 2X/week        Progress Toward Goals  OT Goals(current goals can now be found in the care plan section)  Progress towards OT goals: Progressing toward goals  Acute Rehab OT Goals Patient Stated Goal: decrease pain OT Goal Formulation: With patient Time For Goal Achievement: 03/25/24 Potential to Achieve Goals: Fair ADL Goals Pt Will Perform Grooming: with set-up;sitting Pt Will Perform Upper Body Bathing: with min assist;sitting Pt Will Perform Lower Body Bathing: with mod assist;sitting/lateral leans Pt Will Perform Upper Body Dressing: with min assist;sitting Pt Will Perform Lower Body Dressing: with mod assist;sitting/lateral leans Additional ADL Goal #1: Pt will engage in bed mobility to sit EOB with Mod A as a precursor to ADL engagement OOB.  Plan      Co-evaluation                 AM-PAC OT 6 Clicks Daily Activity     Outcome Measure   Help from another person eating meals?: A Little Help from another person taking care of personal grooming?: A Lot Help from another person toileting, which includes using toliet, bedpan, or urinal?: Total Help from another person bathing (including washing, rinsing, drying)?: Total Help from another person to put on and taking off regular upper body clothing?: A Lot Help from another person to put on and taking off regular lower body clothing?: Total 6 Click Score: 10    End of Session Equipment Utilized During Treatment: Oxygen  OT Visit Diagnosis: Muscle weakness (generalized)  (M62.81);History of falling (Z91.81);Pain Pain - part of body:  (back and abdomen)   Activity Tolerance Patient limited by pain;Patient limited by lethargy   Patient Left in bed;with call bell/phone within reach;with bed alarm set   Nurse Communication Mobility status;Patient requests pain meds        Time: 8893-8868 OT Time Calculation (min): 25 min  Charges: OT General Charges $OT Visit: 1 Visit OT Treatments $Therapeutic Activity: 23-37 mins  Maurilio CROME, OTR/L.  [facility] Acute Rehabilitation  Office: [phone]   Maurilio PARAS Seena Ritacco 03/18/2024, 1:38 PM

## 2024-03-19 ENCOUNTER — Other Ambulatory Visit: Payer: Self-pay

## 2024-03-19 DIAGNOSIS — K388 Other specified diseases of appendix: Secondary | ICD-10-CM | POA: Diagnosis not present

## 2024-03-19 DIAGNOSIS — Z01818 Encounter for other preprocedural examination: Secondary | ICD-10-CM

## 2024-03-19 LAB — PROTIME-INR
INR: 1 (ref 0.8–1.2)
Prothrombin Time: 13.9 s (ref 11.4–15.2)

## 2024-03-19 MED ORDER — CEFAZOLIN SODIUM-DEXTROSE 2-4 GM/100ML-% IV SOLN: 2.0000 g | INTRAVENOUS | Status: AC

## 2024-03-19 MED ORDER — CEFAZOLIN SODIUM-DEXTROSE 2-4 GM/100ML-% IV SOLN: 2.0000 g | Freq: Once | INTRAVENOUS | Status: DC

## 2024-03-19 NOTE — Progress Notes (Signed)
 Physical Therapy Treatment Patient Details Name: Erica Ball MRN: [id number] DOB: 11/06/1950 Today's Date: 03/19/2024   History of Present Illness 73 y.o. female presents to [facility] from SNF d/t low back pain and back spasms. Upon EMS arrival, Pt was hypoxic with O2 saturation in the 80s on RA, improved on 2 L nasal cannula and BP was significantly elevated 180/110. 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 Surgery consult with ?plans for hemicolectomy. PMH: COPD, empyema requiring VATS, chronic hypoxia on 2 L [place] as needed, SVT, tobacco abuse, HTN.    PT Comments  Pt resting in bed on arrival, agreeable to session initially however session limited by pt pain, despite pre-medication. Pt able to roll to L side-lying with CGA for safety, and to R side-lying with max A. Pt requiring max A to initiate side-lying>sit however pt unable to complete due to pain. Pt requiring total A to scoot to HOB in supine and reposition in bed at end of session. Continued education on importance of frequent mobilization to maximize functional mobility gains. Pt continues to benefit from skilled PT services to progress toward functional mobility goals.      If plan is discharge home, recommend the following: Two people to help with walking and/or transfers;Two people to help with bathing/dressing/bathroom;Assistance with cooking/housework;Assist for transportation;Help with stairs or ramp for entrance   Can travel by private vehicle     No  Equipment Recommendations  Hospital bed;Hoyer lift;Wheelchair (measurements PT);Wheelchair cushion (measurements PT);BSC/3in1    Recommendations for Other Services       Precautions / Restrictions Precautions Precautions: Fall Recall of Precautions/Restrictions: Impaired Precaution/Restrictions Comments: Back precautions followed for safety, no formal  orders Restrictions Weight Bearing Restrictions Per Provider Order: No     Mobility  Bed Mobility Overal bed mobility: Needs Assistance Bed Mobility: Rolling, Sidelying to Sit, Sit to Sidelying Rolling: Max assist Sidelying to sit: Max assist     Sit to sidelying: Max assist General bed mobility comments: pt requiring CGA to roll to L side, max A to roll from L sidelying to RE sidelying, to change bed pad. pt able to begin to bring LEs off EOB and elevate trunk however pt then calling out and returning to sidelying    Transfers Overall transfer level: Needs assistance                 General transfer comment: Unable to attempt this date d/t pain.    Ambulation/Gait               General Gait Details: Unable to attempt   Stairs             Wheelchair Mobility     Tilt Bed    Modified Rankin (Stroke Patients Only)       Balance Overall balance assessment: Needs assistance Sitting-balance support: Bilateral upper extremity supported, Feet supported   Sitting balance - Comments: NT this session                                    Communication Communication Communication: Impaired Factors Affecting Communication: Hearing impaired  Cognition Arousal: Alert Behavior During Therapy: Anxious, Restless   PT - Cognitive impairments: No family/caregiver present to determine baseline, Attention, Initiation, Sequencing, Problem solving, Safety/Judgement  PT - Cognition Comments: Pt is currently fixated on her pain, internally distracted. Following commands: Impaired Following commands impaired: Follows one step commands inconsistently    Cueing Cueing Techniques: Verbal cues, Gestural cues  Exercises      General Comments        Pertinent Vitals/Pain Pain Assessment Pain Assessment: Faces Faces Pain Scale: Hurts little more Pain Location: Back and Abdomen Pain Descriptors / Indicators:  Discomfort, Grimacing, Guarding, Moaning Pain Intervention(s): Monitored during session, Limited activity within patient's tolerance    Home Living                          Prior Function            PT Goals (current goals can now be found in the care plan section) Acute Rehab PT Goals Patient Stated Goal: Have less pain PT Goal Formulation: With patient Time For Goal Achievement: 03/25/24 Progress towards PT goals: Not progressing toward goals - comment (pain, self limiting behavior)    Frequency    Min 2X/week      PT Plan      Co-evaluation              AM-PAC PT 6 Clicks Mobility   Outcome Measure  Help needed turning from your back to your side while in a flat bed without using bedrails?: A Lot Help needed moving from lying on your back to sitting on the side of a flat bed without using bedrails?: A Lot Help needed moving to and from a bed to a chair (including a wheelchair)?: A Lot Help needed standing up from a chair using your arms (e.g., wheelchair or bedside chair)?: A Lot Help needed to walk in hospital room?: Total Help needed climbing 3-5 steps with a railing? : Total 6 Click Score: 10    End of Session   Activity Tolerance: Patient tolerated treatment well Patient left: in bed;with call bell/phone within reach;with bed alarm set Nurse Communication: Mobility status PT Visit Diagnosis: Pain;Muscle weakness (generalized) (M62.81);Other abnormalities of gait and mobility (R26.89);Unsteadiness on feet (R26.81);Difficulty in walking, not elsewhere classified (R26.2) Pain - part of body:  (Back and Abdomen)     Time: 9143-9089 PT Time Calculation (min) (ACUTE ONLY): 14 min  Charges:    $Therapeutic Activity: 8-22 mins PT General Charges $$ ACUTE PT VISIT: 1 Visit                     Shaman Muscarella R. PTA Acute Rehabilitation Services Office: [phone]   Therisa CHRISTELLA Boor 03/19/2024, 10:00 AM

## 2024-03-19 NOTE — Plan of Care (Signed)
   Problem: Nutrition: Goal: Adequate nutrition will be maintained Outcome: Progressing   Problem: Coping: Goal: Level of anxiety will decrease Outcome: Progressing

## 2024-03-19 NOTE — Progress Notes (Signed)
 PROGRESS NOTE  Erica Ball  FMW:[id number] DOB: 04-16-1951 DOA: 03/09/2024 PCP: Macel Jayson PARAS, MD   Brief Narrative: Patient is a 21 female with history of hypertension, SVT, COPD chronically on 2 L of oxygen at home who presented from skilled nursing facility with complaint of lower back spasm/pain, unable to ambulate.  CT lumbar spine showed subacute 45% compression fracture along the superior endplate at L1 with a 6 mm posterior bony retropulsion and mild sclerosis.  IR consulted for kyphoplasty.   IR planning to do kyphoplasty on Friday.  Assessment & Plan:  Principal Problem:   Mass of appendix   Acute on chronic back pain/acute L1 compression fracture: Presented with back spasm, pain, unable to ambulate.  Has history of remote L4 compression fracture.  Did not respond to pain medication at her nursing facility.  IR was consulted for kyphoplasty but initially recommended conservative management.  Pain persisted.   Currently on Robaxin, Dilaudid , oxycodone .   IR planning to do kyphoplasty tomorrow.  Abdominal pain/appendiceal mass: CT abdomen/pelvis showed appendiceal mass.  General surgery was consulted.  Findings were concerning for mucocele, no inflammatory changes identified.  No indication for emergent surgical.  Continue bowel regimen.  Denies any abdomen pain, nausea or vomiting today.  Tolerating regular diet  Chronic diastolic CHF: Continue current medication.  Currently euvolemic.  Monitor input/output, daily weight.  2D echo on 10/30/22 had shown EF of 60 to 65%, grade 1 diastolic dysfunction  History of hypertension/SVT: On Toprol , amlodipine , hydralazine , torsemide at home.  Currently on Toprol  and amlodipine  only.  COPD/chronic smoker/chronic hypoxic respiratory failure: Has 80-pack-year history of smoking.  Counseled for cessation.  On nicotine  patch.  Continue bronchodilators as needed. Not in COPD exacerbation at the moment.  Chronically on 2 L of oxygen at  home.  HyperLipidemia: On Crestor   Deconditioning/debility: PT/OT consulted.  Recommending discharge back to SNF when ready          DVT prophylaxis:heparin injection 5,000 Units Start: 03/10/24 1400     Code Status: Full Code  Family Communication: Called and discussed with son lynwood on phone on 11/13  Patient status:Inpatient  Patient is from :SNF  Anticipated discharge to:SNF  Estimated DC date: Awaiting kyphoplasty   Consultants: IR  Procedures: None  Antimicrobials:  Anti-infectives (From admission, onward)    None       Subjective: Patient seen and examined at bedside today.  Hemodynamically stable.  Overall comfortable.  Sleeping when I arrived.  She says her back pain is well-controlled while lying on bed and not moving.  Plan for kyphoplasty tomorrow  Objective: Vitals:   03/18/24 1532 03/18/24 1936 03/19/24 0334 03/19/24 0828  BP: 119/70 (!) 114/95 105/72 101/68  Pulse: 63 68 80 78  Resp: 17 16 19 17   Temp: 98.2 F (36.8 C) 98.2 F (36.8 C) (!) 97.5 F (36.4 C) 97.8 F (36.6 C)  TempSrc: Oral  Oral   SpO2: 98% 93% 90% 90%  Weight:      Height:        Intake/Output Summary (Last 24 hours) at 03/19/2024 1332 Last data filed at 03/19/2024 0834 Gross per 24 hour  Intake 120 ml  Output 500 ml  Net -380 ml   Filed Weights   03/09/24 1322 03/11/24 0500 03/17/24 0500  Weight: 83.9 kg 83.3 kg 79.4 kg    Examination:  General exam: Overall comfortable, not in distress,pleasant female,deconditioned HEENT: PERRL Respiratory system:  no wheezes or crackles  Cardiovascular system: S1 & S2  heard, RRR.  Gastrointestinal system: Abdomen is nondistended, soft and nontender. Central nervous system: Alert and oriented Extremities: No edema, no clubbing ,no cyanosis Skin: No rashes, no ulcers,no icterus     Data Reviewed: I have personally reviewed following labs and imaging studies  CBC: Recent Labs  Lab 03/16/24 0928  WBC 11.0*   NEUTROABS 8.7*  HGB 13.2  HCT 39.2  MCV 95.6  PLT 256   Basic Metabolic Panel: Recent Labs  Lab 03/16/24 0928  NA 134*  K 3.7  CL 95*  CO2 26  GLUCOSE 103*  BUN 6*  CREATININE 0.63  CALCIUM  9.0     No results found for this or any previous visit (from the past 240 hours).   Radiology Studies: No results found.  Scheduled Meds:  amLODipine   5 mg Oral Daily   heparin  5,000 Units Subcutaneous Q8H   metoprolol  succinate  100 mg Oral q AM   rosuvastatin   10 mg Oral QPM   senna-docusate  1 tablet Oral BID   Continuous Infusions:   LOS: 10 days   Ivonne Mustache, MD [facility] P11/13/2025, 1:32 PM

## 2024-03-20 ENCOUNTER — Inpatient Hospital Stay (HOSPITAL_COMMUNITY)

## 2024-03-20 ENCOUNTER — Inpatient Hospital Stay (HOSPITAL_COMMUNITY): Payer: Self-pay | Admitting: Certified Registered Nurse Anesthetist

## 2024-03-20 ENCOUNTER — Encounter (HOSPITAL_COMMUNITY): Payer: Self-pay | Admitting: Internal Medicine

## 2024-03-20 ENCOUNTER — Encounter (HOSPITAL_COMMUNITY)
Admission: EM | Disposition: A | Discharge status: Skilled Nursing Facility | Payer: Self-pay | Source: Skilled Nursing Facility | Attending: Internal Medicine

## 2024-03-20 DIAGNOSIS — J449 Chronic obstructive pulmonary disease, unspecified: Secondary | ICD-10-CM

## 2024-03-20 DIAGNOSIS — I10 Essential (primary) hypertension: Secondary | ICD-10-CM

## 2024-03-20 DIAGNOSIS — S32019A Unspecified fracture of first lumbar vertebra, initial encounter for closed fracture: Secondary | ICD-10-CM

## 2024-03-20 DIAGNOSIS — F17210 Nicotine dependence, cigarettes, uncomplicated: Secondary | ICD-10-CM

## 2024-03-20 DIAGNOSIS — K388 Other specified diseases of appendix: Secondary | ICD-10-CM | POA: Diagnosis not present

## 2024-03-20 HISTORY — PX: IR KYPHO LUMBAR INC FX REDUCE BONE BX UNI/BIL CANNULATION INC/IMAGING: IMG5519

## 2024-03-20 HISTORY — PX: RADIOLOGY WITH ANESTHESIA: SHX6223

## 2024-03-20 LAB — CBC
HCT: 37.2 % (ref 36.0–46.0)
Hemoglobin: 12.1 g/dL (ref 12.0–15.0)
MCH: 31.5 pg (ref 26.0–34.0)
MCHC: 32.5 g/dL (ref 30.0–36.0)
MCV: 96.9 fL (ref 80.0–100.0)
Platelets: 280 K/uL (ref 150–400)
RBC: 3.84 MIL/uL — ABNORMAL LOW (ref 3.87–5.11)
RDW: 12.2 % (ref 11.5–15.5)
WBC: 6.9 K/uL (ref 4.0–10.5)
nRBC: 0 % (ref 0.0–0.2)

## 2024-03-20 LAB — PROTIME-INR
INR: 1 (ref 0.8–1.2)
Prothrombin Time: 13.5 s (ref 11.4–15.2)

## 2024-03-20 LAB — SURGICAL PCR SCREEN
MRSA, PCR: NEGATIVE
Staphylococcus aureus: NEGATIVE

## 2024-03-20 SURGERY — RADIOLOGY WITH ANESTHESIA
Anesthesia: General

## 2024-03-20 MED ORDER — PROPOFOL 10 MG/ML IV BOLUS
INTRAVENOUS | Status: DC | PRN
  Administered 2024-03-20: 100 mg via INTRAVENOUS

## 2024-03-20 MED ORDER — ROCURONIUM BROMIDE 10 MG/ML (PF) SYRINGE
PREFILLED_SYRINGE | INTRAVENOUS | Status: DC | PRN
  Administered 2024-03-20: 40 mg via INTRAVENOUS

## 2024-03-20 MED ORDER — PROPOFOL 500 MG/50ML IV EMUL
INTRAVENOUS | Status: DC | PRN
  Administered 2024-03-20: 100 ug/kg/min via INTRAVENOUS

## 2024-03-20 MED ORDER — FENTANYL CITRATE (PF) 100 MCG/2ML IJ SOLN
INTRAMUSCULAR | Status: AC
  Filled 2024-03-20: qty 2

## 2024-03-20 MED ORDER — FENTANYL CITRATE (PF) 100 MCG/2ML IJ SOLN
25.0000 ug | INTRAMUSCULAR | Status: DC | PRN
  Administered 2024-03-20: 25 ug via INTRAVENOUS
  Administered 2024-03-20: 50 ug via INTRAVENOUS
  Administered 2024-03-20: 25 ug via INTRAVENOUS
  Administered 2024-03-20: 50 ug via INTRAVENOUS

## 2024-03-20 MED ORDER — CHLORHEXIDINE GLUCONATE 0.12 % MT SOLN
15.0000 mL | Freq: Once | OROMUCOSAL | Status: AC
  Administered 2024-03-20: 15 mL via OROMUCOSAL
  Filled 2024-03-20: qty 15

## 2024-03-20 MED ORDER — LIDOCAINE HCL (PF) 1 % IJ SOLN
INTRAMUSCULAR | Status: AC
  Filled 2024-03-20: qty 30

## 2024-03-20 MED ORDER — SUGAMMADEX SODIUM 200 MG/2ML IV SOLN
INTRAVENOUS | Status: DC | PRN
  Administered 2024-03-20: 167.8 mg via INTRAVENOUS

## 2024-03-20 MED ORDER — LACTATED RINGERS IV SOLN: INTRAVENOUS | Status: DC

## 2024-03-20 MED ORDER — DEXAMETHASONE SOD PHOSPHATE PF 10 MG/ML IJ SOLN
INTRAMUSCULAR | Status: DC | PRN
  Administered 2024-03-20: 10 mg via INTRAVENOUS

## 2024-03-20 MED ORDER — IOHEXOL 300 MG/ML  SOLN
50.0000 mL | Freq: Once | INTRAMUSCULAR | Status: AC | PRN
  Administered 2024-03-20: 20 mL

## 2024-03-20 MED ORDER — SODIUM CHLORIDE 0.9 % IV SOLN: INTRAVENOUS | Status: DC

## 2024-03-20 MED ORDER — FENTANYL CITRATE (PF) 250 MCG/5ML IJ SOLN
INTRAMUSCULAR | Status: DC | PRN
Start: 2024-03-20 — End: 2024-03-20
  Administered 2024-03-20 (×2): 50 ug via INTRAVENOUS

## 2024-03-20 MED ORDER — BUPIVACAINE HCL (PF) 0.5 % IJ SOLN
INTRAMUSCULAR | Status: AC
Start: 2024-03-20 — End: 2024-03-20
  Filled 2024-03-20: qty 30

## 2024-03-20 MED ORDER — ORAL CARE MOUTH RINSE: 15.0000 mL | Freq: Once | OROMUCOSAL | Status: AC

## 2024-03-20 MED ORDER — CHLORHEXIDINE GLUCONATE 0.12 % MT SOLN
OROMUCOSAL | Status: AC
  Filled 2024-03-20: qty 15

## 2024-03-20 MED ORDER — ONDANSETRON HCL 4 MG/2ML IJ SOLN
INTRAMUSCULAR | Status: DC | PRN
  Administered 2024-03-20: 4 mg via INTRAVENOUS

## 2024-03-20 MED ORDER — BUPIVACAINE HCL (PF) 0.5 % IJ SOLN
30.0000 mL | Freq: Once | INTRAMUSCULAR | Status: AC
  Administered 2024-03-20: 30 mL
  Filled 2024-03-20: qty 30

## 2024-03-20 MED ORDER — CEFAZOLIN SODIUM-DEXTROSE 2-4 GM/100ML-% IV SOLN
2.0000 g | INTRAVENOUS | Status: AC
  Administered 2024-03-20: 2 g via INTRAVENOUS
  Filled 2024-03-20: qty 100

## 2024-03-20 MED ORDER — MUPIROCIN 2 % EX OINT: 1.0000 | TOPICAL_OINTMENT | Freq: Two times a day (BID) | CUTANEOUS | Status: DC

## 2024-03-20 MED ORDER — PHENYLEPHRINE HCL-NACL 20-0.9 MG/250ML-% IV SOLN
INTRAVENOUS | Status: DC | PRN
  Administered 2024-03-20: 50 ug/min via INTRAVENOUS

## 2024-03-20 MED ORDER — LIDOCAINE 2% (20 MG/ML) 5 ML SYRINGE
INTRAMUSCULAR | Status: DC | PRN
  Administered 2024-03-20: 60 mg via INTRAVENOUS

## 2024-03-20 NOTE — H&P (Signed)
 Chief Complaint: L1 Compression Fracture - IR consulted for kyphoplasty with general anesthesia  Referring Provider(s): Dr. Jillian   Supervising Physician: Jenna Hacker  Patient Status: [facility] - In-pt  History of Present Illness: Erica Ball is a 73 y.o. female with medical history significant for COPD, chronic hypoxia on 2 L nasal cannula as needed, SVT, tobacco abuse, hypertension who presented to ED from [facility] for back spasm/pain. She was found to have L1 compression fx on CT. Follow up MRI was recommended by IR, and the patient was ultimately deemed a candidate for KP as she has failed conservative pain management.    She wears 2L O2 [place] as needed at baseline.    Patient tells me that pain hinders any movement for her but is worst with attempting transition from lying on side to sitting on edge of bed. Denies fever, chills, CP, sore throat, N/V, abd pain, blood in stool or urine, abnormal bruising. She endorses back pain.   Patient is Full Code  Past Medical History:  Diagnosis Date   Acute kidney injury 07/10/2017   Acute respiratory failure with hypoxia (HCC) 07/10/2017   Alcohol abuse    Allergy    Cataract    Diverticulitis 10/30/2011   Elevated troponin 07/10/2017   Hypertension    Lobar pneumonia 07/10/2017   Pleural effusion on right 07/10/2017   Sepsis (HCC) 07/10/2017   SVT (supraventricular tachycardia)    Tobacco abuse    UTI (urinary tract infection) 07/10/2017   Vitamin D  deficiency 10/30/2011    Past Surgical History:  Procedure Laterality Date   ABDOMINAL HYSTERECTOMY     CATARACT EXTRACTION     FRACTURE SURGERY     IR THORACENTESIS ASP PLEURAL SPACE W/IMG GUIDE  07/11/2017   VIDEO ASSISTED THORACOSCOPY (VATS)/EMPYEMA Right 07/12/2017   Procedure: right VIDEO ASSISTED THORACOSCOPY (VATS) for drainage of EMPYEMA and decortication;  Surgeon: Kerrin Elspeth BROCKS, MD;  Location: [facility] OR;  Service: Thoracic;  Laterality: Right;   VIDEO  BRONCHOSCOPY N/A 07/12/2017   Procedure: VIDEO BRONCHOSCOPY;  Surgeon: Kerrin Elspeth BROCKS, MD;  Location: [facility] OR;  Service: Thoracic;  Laterality: N/A;    Allergies: Dextromethorphan  Medications: Prior to Admission medications   Medication Sig Start Date End Date Taking? Authorizing Provider  acetaminophen  (TYLENOL ) 325 MG tablet Take 650 mg by mouth every 6 (six) hours as needed (general discomfort).   Yes [provider]  albuterol  (PROVENTIL  HFA;VENTOLIN  HFA) 108 (90 Base) MCG/ACT inhaler Inhale 2 puffs into the lungs every 4 (four) hours as needed for shortness of breath or wheezing. 07/10/17  Yes [provider]  amLODipine  (NORVASC ) 5 MG tablet Take 5 mg by mouth daily.   Yes [provider]  aspirin  EC 81 MG tablet Take 1 tablet (81 mg total) by mouth daily. 12/06/17  Yes Delford Maude BROCKS, MD  bisacodyl  (DULCOLAX) 10 MG suppository Place 10 mg rectally daily as needed (constipation).   Yes [provider]  calcium  carbonate (OSCAL) 1500 (600 Ca) MG TABS tablet Take 600 mg of elemental calcium  by mouth daily.   Yes [provider]  cyanocobalamin  1000 MCG tablet Take 1 tablet (1,000 mcg total) by mouth daily. 11/28/22  Yes Leotis Bogus, MD  Emollient (BAG BALM) OINT Apply 1 Application topically daily. Apply to lower legs   Yes [provider]  folic acid  (FOLVITE ) 1 MG tablet Take 1 tablet (1 mg total) by mouth daily. 11/05/22  Yes Tobie Lola PARAS, MD  hydrALAZINE  (APRESOLINE )  25 MG tablet Take 1 tablet (25 mg total) by mouth 3 (three) times daily. 01/20/24  Yes Fairy Frames, MD  hydrOXYzine  (ATARAX ) 25 MG tablet Take 25 mg by mouth every 6 (six) hours as needed for anxiety.   Yes [provider]  magnesium  hydroxide (MILK OF MAGNESIA) 400 MG/5ML suspension Take 30 mLs by mouth daily as needed (no bm in 3 days).   Yes [provider]  magnesium  oxide (MAG-OX) 400 (240 Mg) MG tablet Take 1 tablet (400 mg total) by  mouth daily. 11/29/22  Yes Leotis Bogus, MD  meloxicam (MOBIC) 15 MG tablet Take 15 mg by mouth daily.   Yes [provider]  Menthol, Topical Analgesic, 4 % GEL Apply 1 application  topically every 8 (eight) hours as needed (joint pain).   Yes [provider]  metoprolol  succinate (TOPROL  XL) 100 MG 24 hr tablet Take 1 tablet (100 mg total) by mouth daily. Take with or immediately following a meal. Patient taking differently: Take 100 mg by mouth in the morning. 11/04/22 03/14/24 Yes Patel, Poonamkumari J, MD  metoprolol  tartrate (LOPRESSOR ) 25 MG tablet Take 25 mg by mouth every evening.   Yes [provider]  ondansetron  (ZOFRAN ) 4 MG tablet Take 1 tablet (4 mg total) by mouth every 6 (six) hours as needed for nausea. 12/17/22  Yes Regalado, Belkys A, MD  pantoprazole  (PROTONIX ) 40 MG tablet Take 1 tablet (40 mg total) by mouth daily. 11/29/22  Yes Leotis Bogus, MD  promethazine -dextromethorphan (PROMETHAZINE -DM) 6.25-15 MG/5ML syrup Take 5 mLs by mouth every 6 (six) hours as needed for cough.   Yes [provider]  rosuvastatin  (CRESTOR ) 10 MG tablet Take 1 tablet (10 mg total) by mouth daily. Patient taking differently: Take 10 mg by mouth every evening. 11/05/22  Yes Patel, Poonamkumari J, MD  sodium chloride  1 g tablet Take 1-2 g by mouth See admin instructions. Take 2g by mouth in the morning and at bedtime. Take 1g in the afternoon   Yes [provider]  Sodium Phosphates (ENEMA RE) Place 1 Dose rectally daily as needed (constipation not relieved by the dulcolax).   Yes [provider]  thiamine  (VITAMIN B-1) 100 MG tablet Take 1 tablet (100 mg total) by mouth daily. 11/05/22  Yes Patel, Poonamkumari J, MD  torsemide (DEMADEX) 20 MG tablet Take 20 mg by mouth daily.   Yes [provider]  traMADol  (ULTRAM ) 50 MG tablet Take 50 mg by mouth every 6 (six) hours as needed (back pain).   Yes [provider]  predniSONE   (DELTASONE ) 20 MG tablet Take 40 mg by mouth daily. 6 day course Patient not taking: Reported on 03/10/2024    [provider]     Family History  Problem Relation Age of Onset   Hypertension Mother    Heart disease Mother    Hypertension Father    Heart disease Father    Stroke Brother    Hypertension Brother    Heart disease Brother     Social History   Socioeconomic History   Marital status: Divorced    Spouse name: Not on file   Number of children: Not on file   Years of education: Not on file   Highest education level: Not on file  Occupational History   Not on file  Tobacco Use   Smoking status: Every Day    Current packs/day: 1.50    Average packs/day: 1.5 packs/day for 53.0 years (79.5 ttl pk-yrs)  Types: Cigarettes   Smokeless tobacco: Never  Substance and Sexual Activity   Alcohol use: Yes    Alcohol/week: 21.0 standard drinks of alcohol    Types: 21 Shots of liquor per week    Comment: drinks three cups of vodka per day   Drug use: No   Sexual activity: Not Currently  Other Topics Concern   Not on file  Social History Narrative   Not on file   Social Drivers of Health   Financial Resource Strain: Patient Unable To Answer (11/17/2022)   Overall Financial Resource Strain (CARDIA)    Difficulty of Paying Living Expenses: Patient unable to answer  Food Insecurity: No Food Insecurity (03/10/2024)   Hunger Vital Sign    Worried About Running Out of Food in the Last Year: Never true    Ran Out of Food in the Last Year: Never true  Transportation Needs: Patient Declined (03/10/2024)   PRAPARE - Transportation    Lack of Transportation (Medical): Patient declined    Lack of Transportation (Non-Medical): Patient declined  Physical Activity: Sufficiently Active (11/17/2022)   Exercise Vital Sign    Days of Exercise per Week: 7 days    Minutes of Exercise per Session: 30 min  Stress: No Stress Concern Present (11/17/2022)   [employer] of  Occupational Health - Occupational Stress Questionnaire    Feeling of Stress : Only a little  Social Connections: Socially Isolated (03/10/2024)   Social Connection and Isolation Panel    Frequency of Communication with Friends and Family: More than three times a week    Frequency of Social Gatherings with Friends and Family: More than three times a week    Attends Religious Services: Never    Database Administrator or Organizations: No    Attends Banker Meetings: Never    Marital Status: Divorced     Review of Systems: A 12 point ROS discussed and pertinent positives are indicated in the HPI above.  All other systems are negative.  Review of Systems  Vital Signs: BP 126/71 (BP Location: Right Arm)   Pulse 76   Temp 98.9 F (37.2 C) (Axillary)   Resp 16   Ht 5' 8.5 (1.74 m)   Wt 175 lb 0.7 oz (79.4 kg)   SpO2 94%   BMI 26.23 kg/m   Advance Care Plan: The advanced care place/surrogate decision maker was discussed at the time of visit and the patient did not wish to discuss or was not able to name a surrogate decision maker or provide an advance care plan.  Physical Exam  Imaging: MR Lumbar Spine W Wo Contrast Result Date: 03/13/2024 EXAM: MRI LUMBAR SPINE 03/12/2024 04:26:07 PM TECHNIQUE: Multiplanar multisequence MRI of the lumbar spine was performed with and without the administration of intravenous contrast. CONTRAST: 8 mL of Gadavist was administered. COMPARISON: CT lumbar spine 03/09/2024. CLINICAL HISTORY: Compression fracture, lumbar. FINDINGS: BONES AND ALIGNMENT: Normal alignment. L1 superior endplate fracture with approximately 30% height loss 4 mm of bony retropulsion. There is associated bone marrow edema, compatible with an acute or subacute fracture. SPINAL CORD: The conus terminates normally. SOFT TISSUES: Multiple renal cysts. No paraspinal mass. L1-L2: Above fracture with 4 mm of bony retropulsion. No spinal canal stenosis or neural foraminal  narrowing. L2-L3: No significant disc herniation. No spinal canal stenosis or neural foraminal narrowing. L3-L4: Mild disc bulge and mild facet arthropathy. No significant stenosis. L4-L5: Mild disc bulge and right facet arthropathy. No significant canal or foraminal stenosis.  L5-S1: Mild disc bulge and small annular fissure. Disc height loss and desiccation. Mild bilateral facet arthropathy, right greater than left. Mild right foraminal stenosis. No significant canal stenosis. IMPRESSION: 1. Acute or subacute L1 superior endplate fracture with approximately 30% height loss and 4 mm of bony retropulsion. No significant canal stenosis. Electronically signed by: Gilmore Molt MD 03/13/2024 02:54 AM EST RP Workstation: [id number]   DG Abd Portable 1V Result Date: 03/11/2024 EXAM: 1 VIEW XRAY OF THE ABDOMEN 03/11/2024 11:34:00 AM COMPARISON: Prior studies are available. CLINICAL HISTORY: Abdominal distension. FINDINGS: BOWEL: Nonobstructive bowel gas pattern. Mild amount of stool in the colon. SOFT TISSUES: No opaque urinary calculi. BONES: No acute osseous abnormality. IMPRESSION: 1. No acute abnormality in the abdomen. 2. Mild colonic stool burden. Electronically signed by: Lynwood Seip MD 03/11/2024 12:11 PM EST RP Workstation: [id number]   CT ABDOMEN PELVIS W CONTRAST Result Date: 03/09/2024 CLINICAL DATA:  Right lower quadrant mass on earlier lumbar spine CT, low back pain EXAM: CT ABDOMEN AND PELVIS WITH CONTRAST TECHNIQUE: Multidetector CT imaging of the abdomen and pelvis was performed using the standard protocol following bolus administration of intravenous contrast. RADIATION DOSE REDUCTION: This exam was performed according to the departmental dose-optimization program which includes automated exposure control, adjustment of the mA and/or kV according to patient size and/or use of iterative reconstruction technique. CONTRAST:  75mL OMNIPAQUE  IOHEXOL  350 MG/ML SOLN COMPARISON:  03/09/2024 FINDINGS:  Lower chest: No acute pleural or parenchymal lung disease. Hepatobiliary: Dependent calcified gallstone within the gallbladder without cholecystitis. Liver is unremarkable. No biliary duct dilation or choledocholithiasis. Pancreas: Unremarkable. No pancreatic ductal dilatation or surrounding inflammatory changes. Spleen: Normal in size without focal abnormality. Adrenals/Urinary Tract: Multiple subcentimeter renal cortical cysts do not require specific imaging follow-up. No urinary tract calculi or obstructive uropathy within either kidney. Nodular thickening of the left adrenal gland measuring 1.1 cm, measuring 110 HU on this study and 30 HU on the preceding unenhanced CT lumbar spine exam, likely adenoma. Right adrenal is unremarkable. The bladder is grossly normal. Stomach/Bowel: No bowel obstruction or ileus. There is a lobular 6.1 x 4.7 x 5.1 cm peripherally calcified cystic mass in the right lower quadrant, which appears contiguous with the appendix. Findings are concerning for underlying mucocele. There are no acute inflammatory changes identified. Surgical consultation is recommended. Diffuse diverticulosis throughout the distal colon without evidence of acute diverticulitis. Vascular/Lymphatic: Aortic atherosclerosis. No enlarged abdominal or pelvic lymph nodes. Reproductive: Status post hysterectomy. No adnexal masses. Other: No free fluid or free intraperitoneal gas. No abdominal wall hernia. Musculoskeletal: No acute or destructive bony abnormalities. L1 compression deformity is again noted, please see preceding CT lumbar spine for full description of lumbar spine findings. Reconstructed images demonstrate no additional findings. IMPRESSION: 1. 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 No acute inflammatory changes. Surgical consultation and excision with pathologic analysis is recommended to exclude neoplasm. 2.  Cholelithiasis without cholecystitis. 3. Distal colonic diverticulosis without diverticulitis. 4. L1 compression fracture. Please see preceding CT lumbar spine report for complete lumbar spine findings. 5.  Aortic Atherosclerosis (ICD10-I70.0). Electronically Signed   By: Ozell Daring M.D.   On: 03/09/2024 18:22   CT Lumbar Spine Wo Contrast Result Date: 03/09/2024 EXAM: CT OF THE LUMBAR SPINE WITHOUT CONTRAST 03/09/2024 02:42:00 PM TECHNIQUE: CT of the lumbar spine was performed without the administration of intravenous contrast. Multiplanar reformatted images are provided for review. Automated exposure control, iterative reconstruction, and/or weight  based adjustment of the mA/kV was utilized to reduce the radiation dose to as low as reasonably achievable. COMPARISON: Lumbar spine radiographs 06/06/2014. CLINICAL HISTORY: Low back pain, increased fracture risk. FINDINGS: BONES AND ALIGNMENT: Likely subacute 45 percent compression fracture along the superior endplate at L1 with 6 mm posterior bony aortic pulsion and mild sclerosis along the superior endplate compression. Anterior and middle column involvement noted with no posterior column involvement identified. Left anterior flattening of the superior endplate of L4 not changed from 06/06/2014 suggesting a remote subtle L4 compression. Bony demineralization. Normal alignment. DEGENERATIVE CHANGES: Mild disc bulge is present at L3-L4, L4-L5, and L5-S1 without impingement. SOFT TISSUES: No significant paraspinal edema. LUNGS: Scarring or subsegmental atelectasis in both lower lobes. VASCULATURE: Systemic atherosclerosis is present, including the aorta and iliac arteries. GALLBLADDER: Cholelithiasis. KIDNEYS, URETERS, AND BLADDER: 0.7 cm Bosniak category 2 benign but complex cyst of the left kidney upper pole with internal density 96 Hounsfield units on image 17 series 7. No further imaging workup of this lesion is indicated. STOMACH AND BOWEL: Sigmoid colon  diverticulosis. Abnormal fluid density lesion with rim calcification measuring at least 5.7 cm in diameter in the right lower quadrant near the cecum on image 89 series 7, only partially included on today's exam. The appearance is nonspecific and could include mass, Meckel diverticulum, appendiceal mucocele, duplication cyst, or other abnormalities. Dedicated CT of the abdomen and pelvis with contrast is recommended for better assessment. IMPRESSION: 1. Likely subacute 45% compression fracture along the superior endplate at L1 with 6 mm posterior bony retropulsion and mild sclerosis, involving anterior and middle columns without posterior column involvement. 2. Abnormal rim-calcified fluid-density lesion at least 5.7 cm in diameter in the right lower quadrant near the cecum, only partially included; recommend dedicated contrast-enhanced CT of the abdomen and pelvis for further assessment. 3. Left anterior flattening of the superior endplate of L4, unchanged from 06/06/14, suggesting a remote subtle L4 compression. 4. Mild disc bulge at L3-L4, L4-L5, and L5-S1 without impingement. 5. Atherosclerosis. 6. Cholelithiasis. Electronically signed by: Ryan Salvage MD 03/09/2024 03:00 PM EST RP Workstation: [id number]    Labs:  CBC: Recent Labs    03/09/24 1339 03/10/24 0521 03/16/24 0928 03/19/24 2358  WBC 12.4* 11.6* 11.0* 6.9  HGB 14.4 13.8 13.2 12.1  HCT 44.5 42.5 39.2 37.2  PLT 266 233 256 280    COAGS: Recent Labs    03/19/24 1956 03/20/24 0202  INR 1.0 1.0    BMP: Recent Labs    01/20/24 0419 03/09/24 1339 03/10/24 0521 03/16/24 0928  NA 134* 139 138 134*  K 4.3 3.9 3.9 3.7  CL 96* 96* 95* 95*  CO2 28 29 34* 26  GLUCOSE 165* 138* 108* 103*  BUN 20 12 14  6*  CALCIUM  8.7* 8.5* 8.6* 9.0  CREATININE 0.82 0.81 0.74 0.63  GFRNONAA >60 >60 >60 >60    LIVER FUNCTION TESTS: Recent Labs    01/19/24 1125 03/16/24 0928  BILITOT 0.8 0.9  AST 19 22  ALT 18 17  ALKPHOS 51 91   PROT 6.5 6.8  ALBUMIN 3.5 2.9*    TUMOR MARKERS: No results for input(s): AFPTM, CEA, CA199, CHROMGRNA in the last 8760 hours.  Assessment and Plan:  L1 Kyphoplasty today 03/20/24 under general anesthesia with Dr. Jenna Sauer Erica Ball is a 73 y.o. female with medical history significant for COPD, chronic hypoxia on 2 L nasal cannula as needed, SVT, tobacco abuse, hypertension who presented to ED from [facility] for  back spasm/pain. She was found to have L1 compression fx on CT. Follow up MRI was recommended by IR, and the patient was ultimately deemed a candidate for KP as she has failed conservative pain management.   Risks and benefits of L1 Kyphoplasty were discussed with the patient including, but not limited to education regarding the natural healing process of compression fractures without intervention, bleeding, infection, cement migration which may cause spinal cord damage, paralysis, pulmonary embolism or even death.  This interventional procedure involves the use of X-rays and because of the nature of the planned procedure, it is possible that we will have prolonged use of X-ray fluoroscopy.  Potential radiation risks to you include (but are not limited to) the following: - A slightly elevated risk for cancer  several years later in life. This risk is typically less than 0.5% percent. This risk is low in comparison to the normal incidence of human cancer, which is 33% for women and 50% for men according to the [employer] Society. - Radiation induced injury can include skin redness, resembling a rash, tissue breakdown / ulcers and hair loss (which can be temporary or permanent).   The likelihood of either of these occurring depends on the difficulty of the procedure and whether you are sensitive to radiation due to previous procedures, disease, or genetic conditions.   IF your procedure requires a prolonged use of radiation, you will be notified and given  written instructions for further action.  It is your responsibility to monitor the irradiated area for the 2 weeks following the procedure and to notify your physician if you are concerned that you have suffered a radiation induced injury.    All of the patient's questions were answered, patient is agreeable to proceed.  Consent signed and in chart.   Thank you for allowing our service to participate in Erica Ball 's care.  Electronically Signed: Kimble VEAR Clas, PA-C   03/20/2024, 8:24 AM      I spent a total of  15 Minutes   in face to face in clinical consultation, greater than 50% of which was counseling/coordinating care for L1 kyphoplasty

## 2024-03-20 NOTE — Progress Notes (Signed)
 PROGRESS NOTE  NEDA WILLENBRING  FMW:[id number] DOB: [birth year] DOA: 03/09/2024 PCP: Macel Jayson PARAS, MD   Brief Narrative: Patient is a 73 female with history of hypertension, SVT, COPD chronically on 2 L of oxygen at home who presented from skilled nursing facility with complaint of lower back spasm/pain, unable to ambulate.  CT lumbar spine showed subacute 45% compression fracture along the superior endplate at L1 with a 6 mm posterior bony retropulsion and mild sclerosis.  IR consulted for kyphoplasty.   IR planning to do kyphoplasty on today  Assessment & Plan:  Principal Problem:   Mass of appendix   Acute on chronic back pain/acute L1 compression fracture: Presented with back spasm, pain, unable to ambulate.  Has history of remote L4 compression fracture.  Did not respond to pain medication at her nursing facility.  IR was consulted for kyphoplasty but initially recommended conservative management.  Pain persisted.   Currently on Robaxin, Dilaudid , oxycodone .   IR planning to do kyphoplasty today.  Abdominal pain/appendiceal mass: CT abdomen/pelvis showed appendiceal mass.  General surgery was consulted.  Findings were concerning for mucocele, no inflammatory changes identified.  No indication for emergent surgical.  Continue bowel regimen.  Denies any abdomen pain, nausea or vomiting today.  Tolerating regular diet  Chronic diastolic CHF: Continue current medication.  Currently euvolemic.  Monitor input/output, daily weight.  2D echo on 10/30/22 had shown EF of 60 to 65%, grade 1 diastolic dysfunction  History of hypertension/SVT: On Toprol , amlodipine , hydralazine , torsemide at home.  Currently on Toprol  and amlodipine  only.  COPD/chronic smoker/chronic hypoxic respiratory failure: Has 80-pack-year history of smoking.  Counseled for cessation.  On nicotine  patch.  Continue bronchodilators as needed. Not in COPD exacerbation at the moment.  Chronically on 2 L of oxygen at  home.  HyperLipidemia: On Crestor   Deconditioning/debility: PT/OT consulted.  Recommending discharge back to SNF when ready          DVT prophylaxis:heparin injection 5,000 Units Start: 03/10/24 1400     Code Status: Full Code  Family Communication: Called and discussed with son lynwood on phone on 11/13  Patient status:Inpatient  Patient is from :SNF  Anticipated discharge to:SNF  Estimated DC date: Awaiting kyphoplasty   Consultants: IR  Procedures: None yet  Antimicrobials:  Anti-infectives (From admission, onward)    Start     Dose/Rate Route Frequency Ordered Stop   03/20/24 1200  [MAR Hold]  ceFAZolin (ANCEF) IVPB 2g/100 mL premix        (MAR Hold since Fri 03/20/2024 at 0938.Hold Reason: Transfer to a Procedural area)   2 g 200 mL/hr over 30 Minutes Intravenous On call 03/19/24 1513 03/21/24 1200   03/20/24 1000  ceFAZolin (ANCEF) IVPB 2g/100 mL premix        2 g 200 mL/hr over 30 Minutes Intravenous To Radiology 03/20/24 0945 03/20/24 1039   03/19/24 1500  ceFAZolin (ANCEF) IVPB 2g/100 mL premix  Status:  Discontinued        2 g 200 mL/hr over 30 Minutes Intravenous  Once 03/19/24 1408 03/19/24 1511       Subjective: Patient seen and examined the bedside today.  Hemodynamically stable.  Overall comfortable.  Lying on bed.  Complains of back pain but she does not look in any Distress.  Waiting for IR procedure.  Objective: Vitals:   03/20/24 0522 03/20/24 0829 03/20/24 0941 03/20/24 0959  BP: 126/71 135/65 133/63   Pulse: 76 76 75   Resp: 16 18 19  Temp: 98.9 F (37.2 C) 97.8 F (36.6 C) 98.5 F (36.9 C)   TempSrc: Axillary  Oral   SpO2: 94% 92% (!) 87% 95%  Weight:   83.9 kg   Height:   5' 8.5 (1.74 m)    No intake or output data in the 24 hours ending 03/20/24 1130  Filed Weights   03/11/24 0500 03/17/24 0500 03/20/24 0941  Weight: 83.3 kg 79.4 kg 83.9 kg    Examination:   General exam: Overall comfortable, not in distress,pleasant  elderly female HEENT: PERRL Respiratory system:  no wheezes or crackles  Cardiovascular system: S1 & S2 heard, RRR.  Gastrointestinal system: Abdomen is nondistended, soft and nontender. Central nervous system: Alert and oriented Extremities: No edema, no clubbing ,no cyanosis Skin: No rashes, no ulcers,no icterus       Data Reviewed: I have personally reviewed following labs and imaging studies  CBC: Recent Labs  Lab 03/16/24 0928 03/19/24 2358  WBC 11.0* 6.9  NEUTROABS 8.7*  --   HGB 13.2 12.1  HCT 39.2 37.2  MCV 95.6 96.9  PLT 256 280   Basic Metabolic Panel: Recent Labs  Lab 03/16/24 0928  NA 134*  K 3.7  CL 95*  CO2 26  GLUCOSE 103*  BUN 6*  CREATININE 0.63  CALCIUM  9.0     Recent Results (from the past 240 hours)  Surgical PCR screen     Status: None   Collection Time: 03/20/24  8:17 AM   Specimen: Nasal Mucosa; Nasal Swab  Result Value Ref Range Status   MRSA, PCR NEGATIVE NEGATIVE Final   Staphylococcus aureus NEGATIVE NEGATIVE Final    Comment: (NOTE) The Xpert SA Assay (FDA approved for NASAL specimens in patients 55 years of age and older), is one component of a comprehensive surveillance program. It is not intended to diagnose infection nor to guide or monitor treatment. Performed at [facility], [address], [place], [place] [address]      Radiology Studies: No results found.  Scheduled Meds:  [MAR Hold] amLODipine   5 mg Oral Daily   chlorhexidine       [MAR Hold] heparin  5,000 Units Subcutaneous Q8H   [MAR Hold] metoprolol  succinate  100 mg Oral q AM   [MAR Hold] rosuvastatin   10 mg Oral QPM   [MAR Hold] senna-docusate  1 tablet Oral BID   Continuous Infusions:  sodium chloride      [MAR Hold]  ceFAZolin (ANCEF) IV     lactated ringers        LOS: 11 days   Ivonne Mustache, MD [facility] P11/14/2025, 11:30 AM

## 2024-03-20 NOTE — Progress Notes (Signed)
 OT Cancellation Note  Patient Details Name: Erica Ball MRN: [id number] DOB: Jan 16, 1951   Cancelled Treatment:    Reason Eval/Treat Not Completed: Patient at procedure or test/ unavailable. Pt in OR for kyphoplasty. OT will follow up next available time as appropriate  Jacques Karna Loose 03/20/2024, 9:53 AM

## 2024-03-20 NOTE — TOC Progression Note (Signed)
 Transition of Care [facility]) - Progression Note    Patient Details  Name: ROSHANA SHUFFIELD MRN: [id number] Date of Birth: 08-28-1950  [facility]) CM/SW Contact  Bridget Cordella Simmonds, LCSW Phone Number: 03/20/2024, 10:51 AM  Clinical Narrative:   ICM continues to follow for DC needs.     Expected Discharge Plan: Skilled Nursing Facility Barriers to Discharge: Continued Medical Work up               Expected Discharge Plan and Services In-house Referral: Clinical Social Work   Post Acute Care Choice: Skilled Nursing Facility Living arrangements for the past 2 months: Skilled Nursing Facility Teacher, Adult Education)                                       Social Drivers of Health (SDOH) Interventions SDOH Screenings   Food Insecurity: No Food Insecurity (03/10/2024)  Housing: Unknown (03/10/2024)  Transportation Needs: Patient Declined (03/10/2024)  Utilities: Patient Declined (03/10/2024)  Alcohol Screen: Low Risk  (11/17/2022)  Depression (PHQ2-9): Low Risk  (11/17/2022)  Financial Resource Strain: Patient Unable To Answer (11/17/2022)  Physical Activity: Sufficiently Active (11/17/2022)  Social Connections: Socially Isolated (03/10/2024)  Stress: No Stress Concern Present (11/17/2022)  Tobacco Use: High Risk (03/09/2024)  Health Literacy: Inadequate Health Literacy (11/17/2022)    Readmission Risk Interventions    12/12/2022    4:34 PM  Readmission Risk Prevention Plan  Transportation Screening Complete  PCP or Specialist Appt within 3-5 Days Complete  HRI or Home Care Consult Complete  Social Work Consult for Recovery Care Planning/Counseling Complete  Palliative Care Screening Not Applicable  Medication Review Oceanographer) Complete

## 2024-03-20 NOTE — Anesthesia Procedure Notes (Signed)
 Procedure Name: Intubation Date/Time: 03/20/2024 10:33 AM  Performed by: Zelphia Norleen HERO, CRNAPre-anesthesia Checklist: Patient identified, Emergency Drugs available, Suction available and Patient being monitored Patient Re-evaluated:Patient Re-evaluated prior to induction Oxygen Delivery Method: Circle system utilized Preoxygenation: Pre-oxygenation with 100% oxygen Induction Type: IV induction Ventilation: Mask ventilation without difficulty Laryngoscope Size: Mac and 3 Grade View: Grade I Tube type: Oral Tube size: 7.0 mm Number of attempts: 1 Airway Equipment and Method: Stylet Placement Confirmation: ETT inserted through vocal cords under direct vision, positive ETCO2 and breath sounds checked- equal and bilateral Secured at: 22 cm Tube secured with: Tape Dental Injury: Teeth and Oropharynx as per pre-operative assessment

## 2024-03-20 NOTE — Plan of Care (Signed)

## 2024-03-20 NOTE — Anesthesia Preprocedure Evaluation (Addendum)
 Anesthesia Evaluation  Patient identified by MRN, date of birth, ID band Patient awake    Reviewed: Allergy & Precautions, H&P , NPO status , Patient's Chart, lab work & pertinent test results, reviewed documented beta blocker date and time   Airway Mallampati: II  TM Distance: >3 FB Neck ROM: Full    Dental no notable dental hx. (+) Upper Dentures, Dental Advisory Given   Pulmonary COPD, Current Smoker   Pulmonary exam normal breath sounds clear to auscultation       Cardiovascular hypertension, Pt. on medications and Pt. on home beta blockers + dysrhythmias Supra Ventricular Tachycardia + Valvular Problems/Murmurs AI  Rhythm:Regular Rate:Normal     Neuro/Psych negative neurological ROS  negative psych ROS   GI/Hepatic negative GI ROS, Neg liver ROS,,,  Endo/Other  negative endocrine ROS    Renal/GU Renal disease  negative genitourinary   Musculoskeletal   Abdominal   Peds  Hematology negative hematology ROS (+)   Anesthesia Other Findings   Reproductive/Obstetrics negative OB ROS                              Anesthesia Physical Anesthesia Plan  ASA: 3  Anesthesia Plan: General   Post-op Pain Management: Tylenol  PO (pre-op)*   Induction: Intravenous  PONV Risk Score and Plan: 3 and Ondansetron , Dexamethasone  and Treatment may vary due to age or medical condition  Airway Management Planned: Oral ETT  Additional Equipment:   Intra-op Plan:   Post-operative Plan: Extubation in OR  Informed Consent: I have reviewed the patients History and Physical, chart, labs and discussed the procedure including the risks, benefits and alternatives for the proposed anesthesia with the patient or authorized representative who has indicated his/her understanding and acceptance.     Dental advisory given  Plan Discussed with: CRNA  Anesthesia Plan Comments:          Anesthesia  Quick Evaluation

## 2024-03-20 NOTE — Procedures (Signed)
 Interventional Radiology Procedure Note  Procedure: L1 Kyphoplasty  Complications: None  Estimated Blood Loss: < 10 mL  Findings: Bilateral pedicle access for balloon and cement placement in the L1 vertebral body.  Cordella DELENA Banner, MD

## 2024-03-20 NOTE — Transfer of Care (Signed)
 Immediate Anesthesia Transfer of Care Note  Patient: Erica Ball  Procedure(s) Performed: RADIOLOGY WITH ANESTHESIA  Patient Location: PACU  Anesthesia Type:General  Level of Consciousness: awake and alert   Airway & Oxygen Therapy: Patient Spontanous Breathing and Patient connected to nasal cannula oxygen  Post-op Assessment: Report given to RN and Post -op Vital signs reviewed and stable  Post vital signs: Reviewed and stable  Last Vitals:  Vitals Value Taken Time  BP 147/77 03/20/24 12:30  Temp 98   Pulse 73 03/20/24 12:38  Resp 16 03/20/24 12:38  SpO2 91 % 03/20/24 12:38  Vitals shown include unfiled device data.  Last Pain:  Vitals:   03/20/24 1215  TempSrc:   PainSc: 10-Worst pain ever      Patients Stated Pain Goal: 5 (03/20/24 1215)  Complications: No notable events documented.

## 2024-03-21 DIAGNOSIS — K388 Other specified diseases of appendix: Secondary | ICD-10-CM | POA: Diagnosis not present

## 2024-03-21 NOTE — TOC Progression Note (Addendum)
 Transition of Care [facility]) - Progression Note    Patient Details  Name: Erica Ball MRN: [id number] Date of Birth: [birth year]  [facility]) CM/SW Contact  Erica Ball, LCSWA Phone Number: 03/21/2024, 4:54 PM  Clinical Narrative:     Patient is from [facility]. PT recs for SNF.Insurance authorization currently pending for SNF/rehab,.Auth ID# [id number]. Patient agreeable and plans to return when medically ready. Patients son Erica Ball would like for PT or mobility to try and work with patient more before dc. CSW informed MD.  Expected Discharge Plan: Skilled Nursing Facility Barriers to Discharge: Continued Medical Work up               Expected Discharge Plan and Services In-house Referral: Clinical Social Work   Post Acute Care Choice: Skilled Nursing Facility Living arrangements for the past 2 months: Skilled Nursing Facility Teacher, Adult Education)                                       Social Drivers of Health (SDOH) Interventions SDOH Screenings   Food Insecurity: No Food Insecurity (03/10/2024)  Housing: Unknown (03/10/2024)  Transportation Needs: Patient Declined (03/10/2024)  Utilities: Patient Declined (03/10/2024)  Alcohol Screen: Low Risk  (11/17/2022)  Depression (PHQ2-9): Low Risk  (11/17/2022)  Financial Resource Strain: Patient Unable To Answer (11/17/2022)  Physical Activity: Sufficiently Active (11/17/2022)  Social Connections: Socially Isolated (03/10/2024)  Stress: No Stress Concern Present (11/17/2022)  Tobacco Use: High Risk (03/09/2024)  Health Literacy: Inadequate Health Literacy (11/17/2022)    Readmission Risk Interventions    12/12/2022    4:34 PM  Readmission Risk Prevention Plan  Transportation Screening Complete  PCP or Specialist Appt within 3-5 Days Complete  HRI or Home Care Consult Complete  Social Work Consult for Recovery Care Planning/Counseling Complete  Palliative Care Screening Not Applicable  Medication Review Furniture Conservator/restorer) Complete

## 2024-03-21 NOTE — Progress Notes (Signed)
 PROGRESS NOTE  ELMA SHANDS  FMW:[id number] DOB: 1950-08-05 DOA: 03/09/2024 PCP: Macel Jayson PARAS, MD   Brief Narrative: Patient is a 73 female with history of hypertension, SVT, COPD chronically on 2 L of oxygen at home who presented from skilled nursing facility with complaint of lower back spasm/pain, unable to ambulate.  CT lumbar spine showed subacute 45% compression fracture along the superior endplate at L1 with a 6 mm posterior bony retropulsion and mild sclerosis.  IR consulted for kyphoplasty.   IR did L1 kyphoplasty on 11/14.  PT/OT consulted  Assessment & Plan:  Principal Problem:   Mass of appendix   Acute on chronic back pain/acute L1 compression fracture: Presented with back spasm, pain, unable to ambulate.  Has history of remote L4 compression fracture.  Did not respond to pain medication at her nursing facility.  IR was consulted for kyphoplasty but initially recommended conservative management.  Pain persisted.    IR did L1 kyphoplasty on 11/14.  PT/OT consulted.  Back pain well-controlled today  Abdominal pain/appendiceal mass: CT abdomen/pelvis showed appendiceal mass.  General surgery was consulted.  Findings were concerning for mucocele, no inflammatory changes identified.  No indication for emergent surgical.  Continue bowel regimen.  Denies any abdomen pain, nausea or vomiting today.  Tolerating regular diet  Chronic diastolic CHF: Continue current medication.  Currently euvolemic.  Monitor input/output, daily weight.  2D echo on 10/30/22 had shown EF of 60 to 65%, grade 1 diastolic dysfunction  History of hypertension/SVT: On Toprol , amlodipine , hydralazine , torsemide at home.  Currently on Toprol  and amlodipine  only.  COPD/chronic smoker/chronic hypoxic respiratory failure: Has 80-pack-year history of smoking.  Counseled for cessation.  On nicotine  patch.  Continue bronchodilators as needed. Not in COPD exacerbation at the moment.  Chronically on 2 L of oxygen at  home.  HyperLipidemia: On Crestor   Deconditioning/debility: PT/OT consulted.  Recommending discharge back to SNF when ready          DVT prophylaxis:heparin injection 5,000 Units Start: 03/10/24 1400     Code Status: Full Code  Family Communication: Called and discussed with son lynwood on phone on 11/13  Patient status:Inpatient  Patient is from :SNF  Anticipated discharge to:SNF  Estimated DC date: Likely tomorrow.  Waiting for PT/OT evaluation   Consultants: IR  Procedures: L 1 kyphoplasty  Antimicrobials:  Anti-infectives (From admission, onward)    Start     Dose/Rate Route Frequency Ordered Stop   03/20/24 1200  ceFAZolin (ANCEF) IVPB 2g/100 mL premix        2 g 200 mL/hr over 30 Minutes Intravenous On call 03/19/24 1513 03/21/24 1200   03/20/24 1000  ceFAZolin (ANCEF) IVPB 2g/100 mL premix        2 g 200 mL/hr over 30 Minutes Intravenous To Radiology 03/20/24 0945 03/20/24 1039   03/19/24 1500  ceFAZolin (ANCEF) IVPB 2g/100 mL premix  Status:  Discontinued        2 g 200 mL/hr over 30 Minutes Intravenous  Once 03/19/24 1408 03/19/24 1511       Subjective: Patient seen and examined at bedside today.  Hemodynamically stable.  Comfortable.  Lying in bed.  Underwent kyphoplasty yesterday.  Pain well-controlled this morning.  Alert and oriented.  No nausea, vomiting or abdomen pain.  Objective: Vitals:   03/20/24 1315 03/20/24 2012 03/21/24 0548 03/21/24 0834  BP: 138/89 128/74 (!) 159/87 (!) 161/74  Pulse: 78 90 80 80  Resp: 19 15 20 16   Temp: 98.2 F (36.8  C) 97.9 F (36.6 C) 98.1 F (36.7 C) 98 F (36.7 C)  TempSrc: Oral Oral  Oral  SpO2: 91% 93% 97% 95%  Weight:      Height:        Intake/Output Summary (Last 24 hours) at 03/21/2024 1132 Last data filed at 03/21/2024 9062 Gross per 24 hour  Intake 800 ml  Output 1200 ml  Net -400 ml    Filed Weights   03/11/24 0500 03/17/24 0500 03/20/24 0941  Weight: 83.3 kg 79.4 kg 83.9 kg     Examination:   General exam: Overall comfortable, not in distress, pleasant 73-year-old female HEENT: PERRL Respiratory system:  no wheezes or crackles  Cardiovascular system: S1 & S2 heard, RRR.  Gastrointestinal system: Abdomen is nondistended, soft and nontender. Central nervous system: Alert and oriented Extremities: No edema, no clubbing ,no cyanosis Skin: No rashes, no ulcers,no icterus        Data Reviewed: I have personally reviewed following labs and imaging studies  CBC: Recent Labs  Lab 03/16/24 0928 03/19/24 2358  WBC 11.0* 6.9  NEUTROABS 8.7*  --   HGB 13.2 12.1  HCT 39.2 37.2  MCV 95.6 96.9  PLT 256 280   Basic Metabolic Panel: Recent Labs  Lab 03/16/24 0928  NA 134*  K 3.7  CL 95*  CO2 26  GLUCOSE 103*  BUN 6*  CREATININE 0.63  CALCIUM  9.0     Recent Results (from the past 240 hours)  Surgical PCR screen     Status: None   Collection Time: 03/20/24  8:17 AM   Specimen: Nasal Mucosa; Nasal Swab  Result Value Ref Range Status   MRSA, PCR NEGATIVE NEGATIVE Final   Staphylococcus aureus NEGATIVE NEGATIVE Final    Comment: (NOTE) The Xpert SA Assay (FDA approved for NASAL specimens in patients 75 years of age and older), is one component of a comprehensive surveillance program. It is not intended to diagnose infection nor to guide or monitor treatment. Performed at [facility], [address], [place], [place] [address]      Radiology Studies: IR KYPHO LUMBAR INC FX REDUCE BONE BX UNI/BIL CANNULATION INC/IMAGING Result Date: 03/20/2024 INDICATION: Symptomatic L1 fracture involving the superior endplate. Patient's requiring IV pain control end demonstrates significant pain at the fracture site. EXAM: Single level kyphoplasty under fluoroscopy COMPARISON:  None Available. MEDICATIONS: As antibiotic prophylaxis, Ancef 2 gm IV was ordered pre-procedure and administered intravenously within 1 hour of incision. All current medications are in  the EMR and have been reviewed as part of this encounter. ANESTHESIA/SEDATION: General anesthesia FLUOROSCOPY: Radiation Exposure Index (as provided by the fluoroscopic device): See medical record mGy Kerma COMPLICATIONS: None immediate. PROCEDURE: Following a full explanation of the procedure along with the potential associated complications, an informed witnessed consent was obtained. The patient was placed prone on the fluoroscopic table. The skin overlying the lumbar region was then prepped and draped in the usual sterile fashion. The right pedicle at L1 was then infiltrated with 0.25% bupivacaine followed by the advancement of an 11-gauge Jamshidi needle through the right pedicle into the posterior one-third at L1 vertebral body. Using lateral fluoroscopic imaging, the Jamshidi needle was advanced to the anterior aspect of the vertebral body. The pedicle at L1 on the left side was then infiltrated with 0.25% bupivacaine followed by the advancement of an 11 gauge Jamshidi needle through the left pedicle to the posterior 1/3 of the L1 vertebral body. Using lateral fluoroscopic imaging, the GSV  needle was then advanced to the aspirate aspect of the vertebral body. At this time, the balloons were advanced through the needle under fluoroscopic imaging while the GMC needle was slowly retracted exposing the balloon to the L1 vertebral body. The balloons were then inflated and expanded using contrast via a Kyphon inflation syringe device via microtubing. Inflations were continued until there was apposition with the superior and the inferior endplates. At this time, methylmethacrylate mixture was reconstituted with Tobramycin in the Kyphon bone mixing device system. This was then loaded onto the Kyphon bone fillers. The balloon was deflated and removed followed by the instillation of methylmethacrylate mixture with excellent filling in the AP and lateral projections. No extravasation was noted in the disk spaces or  posteriorly into the spinal canal. No epidural venous contamination was seen. The working cannula and the bone filler were then retrieved and removed. IMPRESSION: 1. Status post fluoroscopic-guided needle placement at L1. 2. Status post vertebral body augmentation using balloon kyphoplasty at L1 as described without event. If the patient has known osteoporosis, recommend treatment as clinically indicated. If the patient's bone density status is unknown, DEXA scan is recommended. Electronically Signed   By: Cordella Banner   On: 03/20/2024 13:48    Scheduled Meds:  amLODipine   5 mg Oral Daily   heparin  5,000 Units Subcutaneous Q8H   metoprolol  succinate  100 mg Oral q AM   rosuvastatin   10 mg Oral QPM   senna-docusate  1 tablet Oral BID   Continuous Infusions:   ceFAZolin (ANCEF) IV       LOS: 12 days   Ivonne Mustache, MD [facility] P11/15/2025, 11:32 AM

## 2024-03-21 NOTE — Plan of Care (Signed)

## 2024-03-22 DIAGNOSIS — K388 Other specified diseases of appendix: Secondary | ICD-10-CM | POA: Diagnosis not present

## 2024-03-22 LAB — CBC
HCT: 40.2 % (ref 36.0–46.0)
Hemoglobin: 13.3 g/dL (ref 12.0–15.0)
MCH: 31.9 pg (ref 26.0–34.0)
MCHC: 33.1 g/dL (ref 30.0–36.0)
MCV: 96.4 fL (ref 80.0–100.0)
Platelets: 352 K/uL (ref 150–400)
RBC: 4.17 MIL/uL (ref 3.87–5.11)
RDW: 12.3 % (ref 11.5–15.5)
WBC: 7.2 K/uL (ref 4.0–10.5)
nRBC: 0 % (ref 0.0–0.2)

## 2024-03-22 LAB — BASIC METABOLIC PANEL WITH GFR
Anion gap: 8 (ref 5–15)
BUN: 5 mg/dL — ABNORMAL LOW (ref 8–23)
CO2: 31 mmol/L (ref 22–32)
Calcium: 8.8 mg/dL — ABNORMAL LOW (ref 8.9–10.3)
Chloride: 96 mmol/L — ABNORMAL LOW (ref 98–111)
Creatinine, Ser: 0.53 mg/dL (ref 0.44–1.00)
GFR, Estimated: >60 mL/min (ref >60)
Glucose, Bld: 90 mg/dL (ref 70–99)
Potassium: 4.3 mmol/L (ref 3.5–5.1)
Sodium: 135 mmol/L (ref 135–145)

## 2024-03-22 MED ORDER — TRAMADOL HCL 50 MG PO TABS: 50.0000 mg | ORAL_TABLET | Freq: Four times a day (QID) | ORAL | 0 refills | Status: AC | PRN

## 2024-03-22 MED ORDER — METOPROLOL SUCCINATE ER 100 MG PO TB24: 100.0000 mg | ORAL_TABLET | Freq: Every morning | ORAL | Status: AC

## 2024-03-22 MED ORDER — SENNOSIDES-DOCUSATE SODIUM 8.6-50 MG PO TABS: 1.0000 | ORAL_TABLET | Freq: Two times a day (BID) | ORAL | Status: AC

## 2024-03-22 MED ORDER — POLYETHYLENE GLYCOL 3350 17 G PO PACK: 17.0000 g | PACK | Freq: Every day | ORAL | Status: AC

## 2024-03-22 NOTE — Discharge Summary (Signed)
 Physician Discharge Summary  Erica Ball FMW:[id number] DOB: 1950-12-06 DOA: 03/09/2024  PCP: Macel Jayson PARAS, MD  Admit date: 03/09/2024 Discharge date: 03/22/2024  Admitted From: Home Disposition:  Home  Discharge Condition:Stable CODE STATUS:FULL Diet recommendation:  Regular  Brief/Interim Summary: Patient is a 73 female with history of hypertension, SVT, COPD chronically on 2 L of oxygen at home who presented from skilled nursing facility with complaint of lower back spasm/pain, unable to ambulate.  CT lumbar spine showed subacute 45% compression fracture along the superior endplate at L1 with a 6 mm posterior bony retropulsion and mild sclerosis.  IR consulted for kyphoplasty.   IR did L1 kyphoplasty on 11/14.  PT/OT consulted .Plan is to discharge back to her SNF.  Back pain well-controlled  Following problems were addressed during the hospitalization:  Acute on chronic back pain/acute L1 compression fracture: Presented with back spasm, pain, unable to ambulate.  Has history of remote L4 compression fracture.  Did not respond to pain medication at her nursing facility.  IR was consulted for kyphoplasty but initially recommended conservative management.  Pain persisted.    IR did L1 kyphoplasty on 11/14.  PT/OT consulted.  Back pain well-controlled today   Abdominal pain/appendiceal mass: CT abdomen/pelvis showed appendiceal mass.  General surgery was consulted.  Findings were concerning for mucocele, no inflammatory changes identified.  No indication for emergent surgical.  Continue bowel regimen.  Denies any abdomen pain, nausea or vomiting today.  Tolerating regular diet   Chronic diastolic CHF: Continue current medication.  Currently euvolemic.  Monitor input/output, daily weight.  2D echo on 10/30/22 had shown EF of 60 to 65%, grade 1 diastolic dysfunction.Takes 20 mg torsemide at home   History of hypertension/SVT: On Toprol , amlodipine , hydralazine , torsemide at home.   Continue on dc.  She will follow-up with cardiology as an outpatient   COPD/chronic smoker/chronic hypoxic respiratory failure: Has 80-pack-year history of smoking.  Counseled for cessation.  On nicotine  patch.  Continue bronchodilators as needed. Not in COPD exacerbation at the moment.  Chronically on 2 L of oxygen at home.She will follow up with pulmonology as an outpatient   HyperLipidemia: On Crestor    Deconditioning/debility: PT/OT consulted.  Recommending discharge back to SNF when ready     Discharge Diagnoses:  Principal Problem:   Mass of appendix    Discharge Instructions  Discharge Instructions     Ambulatory referral to Cardiology   Complete by: As directed    History of CHF, SVT  general surgery wants cardiology clearance prior to appendiceal surgery   Ambulatory referral to Gastroenterology   Complete by: As directed    Abdominal appendiceal cystic mass on imaging.   What is the reason for referral?: Colonoscopy   Ambulatory referral to General Surgery   Complete by: As directed    Diet general   Complete by: As directed    Discharge instructions   Complete by: As directed    1)Please take your medications as instructed 2)Do a CBC and BMP tests in a week 3)Follow up with cardiology, pulmonology as an outpatient   Increase activity slowly   Complete by: As directed    Pulmonary Visit   Complete by: As directed    COPD, General surgery wants pulmonary clearance prior to appendiceal surgery   Reason for referral: Other Pulmonary      Allergies as of 03/22/2024       Reactions   Dextromethorphan Other (See Comments)   Severe aggression  Medication List     STOP taking these medications    metoprolol  tartrate 25 MG tablet Commonly known as: LOPRESSOR    predniSONE  20 MG tablet Commonly known as: DELTASONE        TAKE these medications    acetaminophen  325 MG tablet Commonly known as: TYLENOL  Take 650 mg by mouth every 6 (six) hours  as needed (general discomfort).   albuterol  108 (90 Base) MCG/ACT inhaler Commonly known as: VENTOLIN  HFA Inhale 2 puffs into the lungs every 4 (four) hours as needed for shortness of breath or wheezing.   amLODipine  5 MG tablet Commonly known as: NORVASC  Take 5 mg by mouth daily.   aspirin  EC 81 MG tablet Take 1 tablet (81 mg total) by mouth daily.   Bag Balm Oint Apply 1 Application topically daily. Apply to lower legs   bisacodyl  10 MG suppository Commonly known as: DULCOLAX Place 10 mg rectally daily as needed (constipation).   calcium  carbonate 1500 (600 Ca) MG Tabs tablet Commonly known as: OSCAL Take 600 mg of elemental calcium  by mouth daily.   cyanocobalamin  1000 MCG tablet Take 1 tablet (1,000 mcg total) by mouth daily.   ENEMA RE Place 1 Dose rectally daily as needed (constipation not relieved by the dulcolax).   folic acid  1 MG tablet Commonly known as: FOLVITE  Take 1 tablet (1 mg total) by mouth daily.   hydrALAZINE  25 MG tablet Commonly known as: APRESOLINE  Take 1 tablet (25 mg total) by mouth 3 (three) times daily.   hydrOXYzine  25 MG tablet Commonly known as: ATARAX  Take 25 mg by mouth every 6 (six) hours as needed for anxiety.   magnesium  oxide 400 (240 Mg) MG tablet Commonly known as: MAG-OX Take 1 tablet (400 mg total) by mouth daily.   meloxicam 15 MG tablet Commonly known as: MOBIC Take 15 mg by mouth daily.   Menthol (Topical Analgesic) 4 % Gel Apply 1 application  topically every 8 (eight) hours as needed (joint pain).   metoprolol  succinate 100 MG 24 hr tablet Commonly known as: TOPROL -XL Take 1 tablet (100 mg total) by mouth in the morning. Take with or immediately following a meal. Start taking on: March 23, 2024 What changed: when to take this   Milk of Magnesia 1200 MG/15ML suspension Generic drug: magnesium  hydroxide Take 30 mLs by mouth daily as needed (no bm in 3 days).   ondansetron  4 MG tablet Commonly known as:  ZOFRAN  Take 1 tablet (4 mg total) by mouth every 6 (six) hours as needed for nausea.   pantoprazole  40 MG tablet Commonly known as: PROTONIX  Take 1 tablet (40 mg total) by mouth daily.   polyethylene glycol 17 g packet Commonly known as: MIRALAX  / GLYCOLAX  Take 17 g by mouth daily.   promethazine -dextromethorphan 6.25-15 MG/5ML syrup Commonly known as: PROMETHAZINE -DM Take 5 mLs by mouth every 6 (six) hours as needed for cough.   rosuvastatin  10 MG tablet Commonly known as: CRESTOR  Take 1 tablet (10 mg total) by mouth daily. What changed: when to take this   senna-docusate 8.6-50 MG tablet Commonly known as: Senokot-S Take 1 tablet by mouth 2 (two) times daily.   sodium chloride  1 g tablet Take 1-2 g by mouth See admin instructions. Take 2g by mouth in the morning and at bedtime. Take 1g in the afternoon   thiamine  100 MG tablet Commonly known as: Vitamin B-1 Take 1 tablet (100 mg total) by mouth daily.   torsemide 20 MG tablet Commonly known as: DEMADEX  Take 20 mg by mouth daily.   traMADol  50 MG tablet Commonly known as: ULTRAM  Take 1 tablet (50 mg total) by mouth every 6 (six) hours as needed (back pain).        Follow-up Information     Surgery, [facility]. Schedule an appointment as soon as possible for a visit .   Specialty: General Surgery Contact information: [address] [place] [address] [phone]         [facility] Follow up.   Specialty: Gastroenterology Why: Please call to arrange a follow up appointment for a Colonoscopy before your appointment with [facility] Surgery. We have sent a referral for this. Contact information: [address] [place]  [address] [phone]        Delford Maude BROCKS, MD Follow up.   Specialty: Cardiology Why: Please follow up with cardiology for risk stratification and optimization prior to your appointment with Dr. Sheldon. Contact  information: [address] [place] [address] [phone]         Sheldon Standing, MD Follow up on 03/18/2024.   Specialties: General Surgery, Colon and Rectal Surgery Why: 10:35am. Please bring a copy of your photo ID, insurance card and arrive 30 minutes prior to your appointment for paperwork. Contact information: [address] [place] [address] [phone]         Darlean Ozell NOVAK, MD Follow up.   Specialty: Pulmonary Disease Why: Please follow up with pulmonology for risk stratification and optimization prior to your appointment with Dr. Sheldon. Contact information: [address] [place] [address] [phone]                Allergies  Allergen Reactions   Dextromethorphan Other (See Comments)    Severe aggression     Consultations: IR   Procedures/Studies: IR KYPHO LUMBAR INC FX REDUCE BONE BX UNI/BIL CANNULATION INC/IMAGING Result Date: 03/20/2024 INDICATION: Symptomatic L1 fracture involving the superior endplate. Patient's requiring IV pain control end demonstrates significant pain at the fracture site. EXAM: Single level kyphoplasty under fluoroscopy COMPARISON:  None Available. MEDICATIONS: As antibiotic prophylaxis, Ancef 2 gm IV was ordered pre-procedure and administered intravenously within 1 hour of incision. All current medications are in the EMR and have been reviewed as part of this encounter. ANESTHESIA/SEDATION: General anesthesia FLUOROSCOPY: Radiation Exposure Index (as provided by the fluoroscopic device): See medical record mGy Kerma COMPLICATIONS: None immediate. PROCEDURE: Following a full explanation of the procedure along with the potential associated complications, an informed witnessed consent was obtained. The patient was placed prone on the fluoroscopic table. The skin overlying the lumbar region was then prepped and draped in the usual sterile fashion. The right pedicle at L1 was then infiltrated  with 0.25% bupivacaine followed by the advancement of an 11-gauge Jamshidi needle through the right pedicle into the posterior one-third at L1 vertebral body. Using lateral fluoroscopic imaging, the Jamshidi needle was advanced to the anterior aspect of the vertebral body. The pedicle at L1 on the left side was then infiltrated with 0.25% bupivacaine followed by the advancement of an 11 gauge Jamshidi needle through the left pedicle to the posterior 1/3 of the L1 vertebral body. Using lateral fluoroscopic imaging, the GSV needle was then advanced to the aspirate aspect of the vertebral body. At this time, the balloons were advanced through the needle under fluoroscopic imaging while the GMC needle was slowly retracted exposing the balloon to the L1 vertebral  body. The balloons were then inflated and expanded using contrast via a Kyphon inflation syringe device via microtubing. Inflations were continued until there was apposition with the superior and the inferior endplates. At this time, methylmethacrylate mixture was reconstituted with Tobramycin in the Kyphon bone mixing device system. This was then loaded onto the Kyphon bone fillers. The balloon was deflated and removed followed by the instillation of methylmethacrylate mixture with excellent filling in the AP and lateral projections. No extravasation was noted in the disk spaces or posteriorly into the spinal canal. No epidural venous contamination was seen. The working cannula and the bone filler were then retrieved and removed. IMPRESSION: 1. Status post fluoroscopic-guided needle placement at L1. 2. Status post vertebral body augmentation using balloon kyphoplasty at L1 as described without event. If the patient has known osteoporosis, recommend treatment as clinically indicated. If the patient's bone density status is unknown, DEXA scan is recommended. Electronically Signed   By: Cordella Banner   On: 03/20/2024 13:48   MR Lumbar Spine W Wo  Contrast Result Date: 03/13/2024 EXAM: MRI LUMBAR SPINE 03/12/2024 04:26:07 PM TECHNIQUE: Multiplanar multisequence MRI of the lumbar spine was performed with and without the administration of intravenous contrast. CONTRAST: 8 mL of Gadavist was administered. COMPARISON: CT lumbar spine 03/09/2024. CLINICAL HISTORY: Compression fracture, lumbar. FINDINGS: BONES AND ALIGNMENT: Normal alignment. L1 superior endplate fracture with approximately 30% height loss 4 mm of bony retropulsion. There is associated bone marrow edema, compatible with an acute or subacute fracture. SPINAL CORD: The conus terminates normally. SOFT TISSUES: Multiple renal cysts. No paraspinal mass. L1-L2: Above fracture with 4 mm of bony retropulsion. No spinal canal stenosis or neural foraminal narrowing. L2-L3: No significant disc herniation. No spinal canal stenosis or neural foraminal narrowing. L3-L4: Mild disc bulge and mild facet arthropathy. No significant stenosis. L4-L5: Mild disc bulge and right facet arthropathy. No significant canal or foraminal stenosis. L5-S1: Mild disc bulge and small annular fissure. Disc height loss and desiccation. Mild bilateral facet arthropathy, right greater than left. Mild right foraminal stenosis. No significant canal stenosis. IMPRESSION: 1. Acute or subacute L1 superior endplate fracture with approximately 30% height loss and 4 mm of bony retropulsion. No significant canal stenosis. Electronically signed by: Gilmore Molt MD 03/13/2024 02:54 AM EST RP Workstation: [id number]   DG Abd Portable 1V Result Date: 03/11/2024 EXAM: 1 VIEW XRAY OF THE ABDOMEN 03/11/2024 11:34:00 AM COMPARISON: Prior studies are available. CLINICAL HISTORY: Abdominal distension. FINDINGS: BOWEL: Nonobstructive bowel gas pattern. Mild amount of stool in the colon. SOFT TISSUES: No opaque urinary calculi. BONES: No acute osseous abnormality. IMPRESSION: 1. No acute abnormality in the abdomen. 2. Mild colonic stool burden.  Electronically signed by: Lynwood Seip MD 03/11/2024 12:11 PM EST RP Workstation: [id number]   CT ABDOMEN PELVIS W CONTRAST Result Date: 03/09/2024 CLINICAL DATA:  Right lower quadrant mass on earlier lumbar spine CT, low back pain EXAM: CT ABDOMEN AND PELVIS WITH CONTRAST TECHNIQUE: Multidetector CT imaging of the abdomen and pelvis was performed using the standard protocol following bolus administration of intravenous contrast. RADIATION DOSE REDUCTION: This exam was performed according to the departmental dose-optimization program which includes automated exposure control, adjustment of the mA and/or kV according to patient size and/or use of iterative reconstruction technique. CONTRAST:  75mL OMNIPAQUE  IOHEXOL  350 MG/ML SOLN COMPARISON:  03/09/2024 FINDINGS: Lower chest: No acute pleural or parenchymal lung disease. Hepatobiliary: Dependent calcified gallstone within the gallbladder without cholecystitis. Liver is unremarkable. No biliary duct dilation or choledocholithiasis. Pancreas: Unremarkable.  No pancreatic ductal dilatation or surrounding inflammatory changes. Spleen: Normal in size without focal abnormality. Adrenals/Urinary Tract: Multiple subcentimeter renal cortical cysts do not require specific imaging follow-up. No urinary tract calculi or obstructive uropathy within either kidney. Nodular thickening of the left adrenal gland measuring 1.1 cm, measuring 110 HU on this study and 30 HU on the preceding unenhanced CT lumbar spine exam, likely adenoma. Right adrenal is unremarkable. The bladder is grossly normal. Stomach/Bowel: No bowel obstruction or ileus. There is a lobular 6.1 x 4.7 x 5.1 cm peripherally calcified cystic mass in the right lower quadrant, which appears contiguous with the appendix. Findings are concerning for underlying mucocele. There are no acute inflammatory changes identified. Surgical consultation is recommended. Diffuse diverticulosis throughout the distal colon without  evidence of acute diverticulitis. Vascular/Lymphatic: Aortic atherosclerosis. No enlarged abdominal or pelvic lymph nodes. Reproductive: Status post hysterectomy. No adnexal masses. Other: No free fluid or free intraperitoneal gas. No abdominal wall hernia. Musculoskeletal: No acute or destructive bony abnormalities. L1 compression deformity is again noted, please see preceding CT lumbar spine for full description of lumbar spine findings. Reconstructed images demonstrate no additional findings. IMPRESSION: 1. 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 No acute inflammatory changes. Surgical consultation and excision with pathologic analysis is recommended to exclude neoplasm. 2. Cholelithiasis without cholecystitis. 3. Distal colonic diverticulosis without diverticulitis. 4. L1 compression fracture. Please see preceding CT lumbar spine report for complete lumbar spine findings. 5.  Aortic Atherosclerosis (ICD10-I70.0). Electronically Signed   By: Ozell Daring M.D.   On: 03/09/2024 18:22   CT Lumbar Spine Wo Contrast Result Date: 03/09/2024 EXAM: CT OF THE LUMBAR SPINE WITHOUT CONTRAST 03/09/2024 02:42:00 PM TECHNIQUE: CT of the lumbar spine was performed without the administration of intravenous contrast. Multiplanar reformatted images are provided for review. Automated exposure control, iterative reconstruction, and/or weight based adjustment of the mA/kV was utilized to reduce the radiation dose to as low as reasonably achievable. COMPARISON: Lumbar spine radiographs 06/06/2014. CLINICAL HISTORY: Low back pain, increased fracture risk. FINDINGS: BONES AND ALIGNMENT: Likely subacute 45 percent compression fracture along the superior endplate at L1 with 6 mm posterior bony aortic pulsion and mild sclerosis along the superior endplate compression. Anterior and middle column involvement noted with no posterior column  involvement identified. Left anterior flattening of the superior endplate of L4 not changed from 06/06/2014 suggesting a remote subtle L4 compression. Bony demineralization. Normal alignment. DEGENERATIVE CHANGES: Mild disc bulge is present at L3-L4, L4-L5, and L5-S1 without impingement. SOFT TISSUES: No significant paraspinal edema. LUNGS: Scarring or subsegmental atelectasis in both lower lobes. VASCULATURE: Systemic atherosclerosis is present, including the aorta and iliac arteries. GALLBLADDER: Cholelithiasis. KIDNEYS, URETERS, AND BLADDER: 0.7 cm Bosniak category 2 benign but complex cyst of the left kidney upper pole with internal density 96 Hounsfield units on image 17 series 7. No further imaging workup of this lesion is indicated. STOMACH AND BOWEL: Sigmoid colon diverticulosis. Abnormal fluid density lesion with rim calcification measuring at least 5.7 cm in diameter in the right lower quadrant near the cecum on image 89 series 7, only partially included on today's exam. The appearance is nonspecific and could include mass, Meckel diverticulum, appendiceal mucocele, duplication cyst, or other abnormalities. Dedicated CT of the abdomen and pelvis with contrast is recommended for better assessment. IMPRESSION: 1. Likely subacute 45% compression fracture along the superior endplate at L1 with 6 mm posterior bony retropulsion and mild sclerosis, involving anterior  and middle columns without posterior column involvement. 2. Abnormal rim-calcified fluid-density lesion at least 5.7 cm in diameter in the right lower quadrant near the cecum, only partially included; recommend dedicated contrast-enhanced CT of the abdomen and pelvis for further assessment. 3. Left anterior flattening of the superior endplate of L4, unchanged from 06/06/14, suggesting a remote subtle L4 compression. 4. Mild disc bulge at L3-L4, L4-L5, and L5-S1 without impingement. 5. Atherosclerosis. 6. Cholelithiasis. Electronically signed by:  Ryan Salvage MD 03/09/2024 03:00 PM EST RP Workstation: [id number]      Subjective: Patient seen and examined at bedside today.  Comfortable.  Lying on bed.  Denies any significant back pain today.  Very eager to go back to her facility.  No new complaints.  Medically stable for discharge  Discharge Exam: Vitals:   03/22/24 0436 03/22/24 0754  BP: 132/74 (!) 156/74  Pulse: 71 68  Resp: 14 18  Temp: (!) 97.5 F (36.4 C) (!) 97.5 F (36.4 C)  SpO2: 91% 92%   Vitals:   03/21/24 1540 03/21/24 2021 03/22/24 0436 03/22/24 0754  BP: (!) 151/81 (!) 155/74 132/74 (!) 156/74  Pulse: 79 80 71 68  Resp: 17 18 14 18   Temp: 97.9 F (36.6 C) 98 F (36.7 C) (!) 97.5 F (36.4 C) (!) 97.5 F (36.4 C)  TempSrc: Oral   Oral  SpO2: 97% 92% 91% 92%  Weight:      Height:        General: Pt is alert, awake, not in acute distress,pleasant female Cardiovascular: RRR, S1/S2 +, no rubs, no gallops Respiratory: CTA bilaterally, no wheezing, no rhonchi Abdominal: Soft, NT, ND, bowel sounds + Extremities: no edema, no cyanosis    The results of significant diagnostics from this hospitalization (including imaging, microbiology, ancillary and laboratory) are listed below for reference.     Microbiology: Recent Results (from the past 240 hours)  Surgical PCR screen     Status: None   Collection Time: 03/20/24  8:17 AM   Specimen: Nasal Mucosa; Nasal Swab  Result Value Ref Range Status   MRSA, PCR NEGATIVE NEGATIVE Final   Staphylococcus aureus NEGATIVE NEGATIVE Final    Comment: (NOTE) The Xpert SA Assay (FDA approved for NASAL specimens in patients 53 years of age and older), is one component of a comprehensive surveillance program. It is not intended to diagnose infection nor to guide or monitor treatment. Performed at [facility], [address], [place], [place] [address]      Labs: BNP (last 3 results) Recent Labs    01/18/24 0934  BNP 212.1*   Basic Metabolic  Panel: Recent Labs  Lab 03/16/24 0928 03/22/24 0708  NA 134* 135  K 3.7 4.3  CL 95* 96*  CO2 26 31  GLUCOSE 103* 90  BUN 6* 5*  CREATININE 0.63 0.53  CALCIUM  9.0 8.8*   Liver Function Tests: Recent Labs  Lab 03/16/24 0928  AST 22  ALT 17  ALKPHOS 91  BILITOT 0.9  PROT 6.8  ALBUMIN 2.9*   No results for input(s): LIPASE, AMYLASE in the last 168 hours. No results for input(s): AMMONIA in the last 168 hours. CBC: Recent Labs  Lab 03/16/24 0928 03/19/24 2358 03/22/24 0708  WBC 11.0* 6.9 7.2  NEUTROABS 8.7*  --   --   HGB 13.2 12.1 13.3  HCT 39.2 37.2 40.2  MCV 95.6 96.9 96.4  PLT 256 280 352   Cardiac Enzymes: No results for input(s): CKTOTAL, CKMB, CKMBINDEX, TROPONINI  in the last 168 hours. BNP: Invalid input(s): POCBNP CBG: No results for input(s): GLUCAP in the last 168 hours. D-Dimer No results for input(s): DDIMER in the last 72 hours. Hgb A1c No results for input(s): HGBA1C in the last 72 hours. Lipid Profile No results for input(s): CHOL, HDL, LDLCALC, TRIG, CHOLHDL, LDLDIRECT in the last 72 hours. Thyroid function studies No results for input(s): TSH, T4TOTAL, T3FREE, THYROIDAB in the last 72 hours.  Invalid input(s): FREET3 Anemia work up No results for input(s): VITAMINB12, FOLATE, FERRITIN, TIBC, IRON, RETICCTPCT in the last 72 hours. Urinalysis    Component Value Date/Time   COLORURINE COLORLESS (A) 12/12/2022 0017   APPEARANCEUR CLEAR 12/12/2022 0017   LABSPEC 1.003 (L) 12/12/2022 0017   PHURINE 8.0 12/12/2022 0017   GLUCOSEU NEGATIVE 12/12/2022 0017   HGBUR NEGATIVE 12/12/2022 0017   BILIRUBINUR NEGATIVE 12/12/2022 0017   BILIRUBINUR neg 10/11/2011 1425   KETONESUR NEGATIVE 12/12/2022 0017   PROTEINUR NEGATIVE 12/12/2022 0017   UROBILINOGEN 0.2 10/11/2011 1425   UROBILINOGEN 0.2 01/24/2008 2217   NITRITE NEGATIVE 12/12/2022 0017   LEUKOCYTESUR TRACE (A) 12/12/2022 0017    Sepsis Labs Recent Labs  Lab 03/16/24 0928 03/19/24 2358 03/22/24 0708  WBC 11.0* 6.9 7.2   Microbiology Recent Results (from the past 240 hours)  Surgical PCR screen     Status: None   Collection Time: 03/20/24  8:17 AM   Specimen: Nasal Mucosa; Nasal Swab  Result Value Ref Range Status   MRSA, PCR NEGATIVE NEGATIVE Final   Staphylococcus aureus NEGATIVE NEGATIVE Final    Comment: (NOTE) The Xpert SA Assay (FDA approved for NASAL specimens in patients 38 years of age and older), is one component of a comprehensive surveillance program. It is not intended to diagnose infection nor to guide or monitor treatment. Performed at [facility], [address] [place], [place] [address]     Please note: You were cared for by a hospitalist during your hospital stay. Once you are discharged, your primary care physician will handle any further medical issues. Please note that NO REFILLS for any discharge medications will be authorized once you are discharged, as it is imperative that you return to your primary care physician (or establish a relationship with a primary care physician if you do not have one) for your post hospital discharge needs so that they can reassess your need for medications and monitor your lab values.    Time coordinating discharge: 40 minutes  SIGNED:   Ivonne Mustache, MD  [facility] 03/22/2024, 11:55 AM Pager [phone]  If 7PM-7AM, please contact night-coverage [email or web address] Password [email or web address]

## 2024-03-22 NOTE — Progress Notes (Signed)
 Physical Therapy Re-eval Patient Details Name: Erica Ball MRN: [id number] DOB: [birth year] Today's Date: 03/22/2024   History of Present Illness 73 y.o. female presents to [facility] from SNF d/t low back pain and back spasms. 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 Surgery consult with ?plans for hemicolectomy. Underwent kyphoplasty on 03/20/24. PMH: COPD, empyema requiring VATS, chronic hypoxia on 2 L [place] as needed, SVT, tobacco abuse, HTN.    PT Comments  Patient now s/p kypoplasty and much less pain.  Patient able to tolerate more activity.  Patient still presents with dependencies in gait and mobility limited by generalized weakness, decreased balance, and decreased cognition. Pt will benefit from continued PT in an intensive post-acute setting, < 3 hours/day.  PT will follow while in hospital.     If plan is discharge home, recommend the following: Two people to help with walking and/or transfers;Two people to help with bathing/dressing/bathroom;Assistance with cooking/housework;Assist for transportation;Help with stairs or ramp for entrance   Can travel by private vehicle     No  Equipment Recommendations  Rollator (4 wheels);BSC/3in1 (if home, likely would need more equipment)    Recommendations for Other Services       Precautions / Restrictions Precautions Precautions: Fall Recall of Precautions/Restrictions: Impaired Precaution/Restrictions Comments: Back precautions followed for safety, no formal orders Restrictions Weight Bearing Restrictions Per Provider Order: No     Mobility  Bed Mobility Overal bed mobility: Needs Assistance Bed Mobility: Rolling, Sidelying to Sit Rolling: Min assist Sidelying to sit: Min assist       General bed mobility comments: verbal and tactile cues for rolling    Transfers Overall transfer level: Needs assistance Equipment used:  Rolling walker (2 wheels) Transfers: Sit to/from Stand Sit to Stand: Min assist           General transfer comment: pt anxious to sit back down, required increased encouragement.  After sitting, attempted stand again, unable to reach upright with +1 assist.    Ambulation/Gait Ambulation/Gait assistance: Min assist Gait Distance (Feet): 15 Feet Assistive device: Rolling walker (2 wheels) Gait Pattern/deviations: Step-to pattern, Decreased stride length, Decreased weight shift to right, Decreased weight shift to left, Trunk flexed Gait velocity: decreased         Stairs             Wheelchair Mobility     Tilt Bed    Modified Rankin (Stroke Patients Only)       Balance Overall balance assessment: Needs assistance Sitting-balance support: Bilateral upper extremity supported, Feet supported Sitting balance-Leahy Scale: Fair     Standing balance support: Bilateral upper extremity supported, During functional activity, Reliant on assistive device for balance Standing balance-Leahy Scale: Poor                              Communication Communication Communication: Impaired Factors Affecting Communication: Hearing impaired  Cognition Arousal: Alert Behavior During Therapy: Anxious, Flat affect   PT - Cognitive impairments: No family/caregiver present to determine baseline, Attention, Initiation, Sequencing, Problem solving, Safety/Judgement                       PT - Cognition Comments: unable to recall surgery was yesterday (said a few days ago), didn't recall she had been in pain prior to surgery (I have been fine since I got here), yet, remembered my  name! Following commands: Impaired Following commands impaired: Only follows one step commands consistently    Cueing    Exercises      General Comments        Pertinent Vitals/Pain Pain Assessment Pain Assessment: Faces Faces Pain Scale: Hurts little more Pain Location:  back Pain Descriptors / Indicators: Discomfort, Grimacing, Guarding Pain Intervention(s): Limited activity within patient's tolerance, Monitored during session    Home Living                          Prior Function            PT Goals (current goals can now be found in the care plan section) Acute Rehab PT Goals Patient Stated Goal: Get better PT Goal Formulation: With patient Time For Goal Achievement: 04/05/24 Potential to Achieve Goals: Good Progress towards PT goals: Goals updated (goals updated to reflect current progress s/p kypoplasty)    Frequency    Min 2X/week      PT Plan      Co-evaluation              AM-PAC PT 6 Clicks Mobility   Outcome Measure  Help needed turning from your back to your side while in a flat bed without using bedrails?: A Lot Help needed moving from lying on your back to sitting on the side of a flat bed without using bedrails?: A Lot Help needed moving to and from a bed to a chair (including a wheelchair)?: A Lot Help needed standing up from a chair using your arms (e.g., wheelchair or bedside chair)?: A Lot Help needed to walk in hospital room?: A Little Help needed climbing 3-5 steps with a railing? : A Lot 6 Click Score: 13    End of Session Equipment Utilized During Treatment: Gait belt Activity Tolerance: Patient tolerated treatment well Patient left: in chair;with call bell/phone within reach;with chair alarm set Nurse Communication: Mobility status PT Visit Diagnosis: Pain;Muscle weakness (generalized) (M62.81);Other abnormalities of gait and mobility (R26.89);Unsteadiness on feet (R26.81);Difficulty in walking, not elsewhere classified (R26.2)     Time: 8971-8950 PT Time Calculation (min) (ACUTE ONLY): 21 min  Charges:    $Gait Training: 8-22 mins PT General Charges $$ ACUTE PT VISIT: 1 Visit                     03/22/2024 Lebron, PT Acute Rehabilitation Services Office:   [phone]    Katharina Venus HERO 03/22/2024, 11:02 AM

## 2024-03-22 NOTE — TOC Progression Note (Signed)
 Transition of Care [facility]) - Progression Note    Patient Details  Name: Erica Ball MRN: [id number] Date of Birth: February 01, 1951  Transition of Care [facility]) CM/SW Contact  Dino CHRISTELLA Au, LCSWA Phone Number: 03/22/2024, 10:47 AM  Clinical Narrative:     SW spoke with Myrene [facility] [phone]) provided update. Pt will need to see PT post kyphoplasty.  Expected Discharge Plan: Skilled Nursing Facility Barriers to Discharge: Continued Medical Work up               Expected Discharge Plan and Services In-house Referral: Clinical Social Work   Post Acute Care Choice: Skilled Nursing Facility Living arrangements for the past 2 months: Skilled Nursing Facility Teacher, Adult Education)                                       Social Drivers of Health (SDOH) Interventions SDOH Screenings   Food Insecurity: No Food Insecurity (03/10/2024)  Housing: Unknown (03/10/2024)  Transportation Needs: Patient Declined (03/10/2024)  Utilities: Patient Declined (03/10/2024)  Alcohol Screen: Low Risk  (11/17/2022)  Depression (PHQ2-9): Low Risk  (11/17/2022)  Financial Resource Strain: Patient Unable To Answer (11/17/2022)  Physical Activity: Sufficiently Active (11/17/2022)  Social Connections: Socially Isolated (03/10/2024)  Stress: No Stress Concern Present (11/17/2022)  Tobacco Use: High Risk (03/09/2024)  Health Literacy: Inadequate Health Literacy (11/17/2022)    Readmission Risk Interventions    12/12/2022    4:34 PM  Readmission Risk Prevention Plan  Transportation Screening Complete  PCP or Specialist Appt within 3-5 Days Complete  HRI or Home Care Consult Complete  Social Work Consult for Recovery Care Planning/Counseling Complete  Palliative Care Screening Not Applicable  Medication Review Oceanographer) Complete

## 2024-03-22 NOTE — Progress Notes (Signed)
 Pt to be discharged to [facility] via PTAR. Report called to RN at facility. AVS printed and placed with other paperwork to be given to PTAR on arrival. Pts IV removed and tele disconnected prior to discharge. Pts belongings gathered and pt assisted into clothing for transportation. Pt transported off unit via stretcher and PTAR.

## 2024-03-22 NOTE — TOC Progression Note (Signed)
 Transition of Care [facility]) - Progression Note    Patient Details  Name: Erica Ball MRN: [id number] Date of Birth: 08-31-1950  Transition of Care [facility]) CM/SW Contact  Dino CHRISTELLA Au, LCSWA Phone Number: 03/22/2024, 1:02 PM  Clinical Narrative:     SW spoke with [facility] [phone]) shara still pending, requesting prior LOF and recent PT notes. '  SW faxed clinicals to: [phone]  SW spoke with Myrene [facility] [phone]) reports pt can return today with auth pending.  Update 119pm  Pt will return to room: 229A Call report: [phone]  Expected Discharge Plan: Skilled Nursing Facility Barriers to Discharge: Continued Medical Work up   Expected Discharge Plan and Services In-house Referral: Clinical Social Work   Post Acute Care Choice: Skilled Nursing Facility Living arrangements for the past 2 months: Skilled Nursing Facility Teacher, Adult Education)                                       Social Drivers of Health (SDOH) Interventions SDOH Screenings   Food Insecurity: No Food Insecurity (03/10/2024)  Housing: Unknown (03/10/2024)  Transportation Needs: Patient Declined (03/10/2024)  Utilities: Patient Declined (03/10/2024)  Alcohol Screen: Low Risk  (11/17/2022)  Depression (PHQ2-9): Low Risk  (11/17/2022)  Financial Resource Strain: Patient Unable To Answer (11/17/2022)  Physical Activity: Sufficiently Active (11/17/2022)  Social Connections: Socially Isolated (03/10/2024)  Stress: No Stress Concern Present (11/17/2022)  Tobacco Use: High Risk (03/09/2024)  Health Literacy: Inadequate Health Literacy (11/17/2022)    Readmission Risk Interventions    12/12/2022    4:34 PM  Readmission Risk Prevention Plan  Transportation Screening Complete  PCP or Specialist Appt within 3-5 Days Complete  HRI or Home Care Consult Complete  Social Work Consult for Recovery Care Planning/Counseling Complete  Palliative Care Screening Not Applicable  Medication  Review Oceanographer) Complete

## 2024-03-22 NOTE — TOC Transition Note (Addendum)
 Transition of Care [facility]) - Discharge Note   Patient Details  Name: Erica Ball MRN: [id number] Date of Birth: Sep 26, 1950  Transition of Care [facility]) CM/SW Contact:  Dino CHRISTELLA Au, LCSWA Phone Number: 03/22/2024, 1:41 PM   Clinical Narrative:     SW met with pt at bedside, informed of medical readiness. Pt excited to be returning today. Pt reports she will update her son Warren.   PTAR called.   Final next level of care: Skilled Nursing Facility Barriers to Discharge: Continued Medical Work up   Patient Goals and CMS Choice Patient states their goals for this hospitalization and ongoing recovery are:: go back to [facility]   Choice offered to / list presented to : Patient (pt plans to return to [facility])      Discharge Placement              Patient chooses bed at: [facility] Patient to be transferred to facility by: PTAR Name of family member notified: pt reports will notify son Patient and family notified of of transfer: 03/22/24  Discharge Plan and Services Additional resources added to the After Visit Summary for   In-house Referral: Clinical Social Work   Post Acute Care Choice: Skilled Nursing Facility                               Social Drivers of Health (SDOH) Interventions SDOH Screenings   Food Insecurity: No Food Insecurity (03/10/2024)  Housing: Unknown (03/10/2024)  Transportation Needs: Patient Declined (03/10/2024)  Utilities: Patient Declined (03/10/2024)  Alcohol Screen: Low Risk  (11/17/2022)  Depression (PHQ2-9): Low Risk  (11/17/2022)  Financial Resource Strain: Patient Unable To Answer (11/17/2022)  Physical Activity: Sufficiently Active (11/17/2022)  Social Connections: Socially Isolated (03/10/2024)  Stress: No Stress Concern Present (11/17/2022)  Tobacco Use: High Risk (03/09/2024)  Health Literacy: Inadequate Health Literacy (11/17/2022)     Readmission Risk Interventions    12/12/2022    4:34 PM  Readmission  Risk Prevention Plan  Transportation Screening Complete  PCP or Specialist Appt within 3-5 Days Complete  HRI or Home Care Consult Complete  Social Work Consult for Recovery Care Planning/Counseling Complete  Palliative Care Screening Not Applicable  Medication Review Oceanographer) Complete

## 2024-03-22 NOTE — Progress Notes (Signed)
 Multiple attempts made to call report to [facility]. No RN available to take report at time. Waiting for call back

## 2024-03-22 NOTE — Plan of Care (Signed)

## 2024-03-23 ENCOUNTER — Telehealth: Payer: Self-pay

## 2024-03-23 ENCOUNTER — Encounter (HOSPITAL_COMMUNITY): Payer: Self-pay

## 2024-03-23 NOTE — Telephone Encounter (Signed)
 Barnie states patient is needing preop clearance for patient for surgeon Dr. Sheldon. Informed Deborah Dr. Sheldon' office will need to fax preop clearance request to our office at [phone].   Barnie verbalized understanding and expressed appreciation for call.

## 2024-03-23 NOTE — Telephone Encounter (Signed)
 Patient underwent IR KP with 03/20/24 with Dr. Jenna. She was subsequently discharged yesterday after adequate control of back pain achieved per notes from IP team.   Spoke with patient's RN at [facility]. She reports that patient is currently sitting up well in her chair and has been participating well with PT. She is not back to her baseline mobility and does continue to require PRN pain medications. IR will continue following OP; schedulers will reach out to schedule clinic appt appox 1 mo from procedure.

## 2024-03-24 ENCOUNTER — Telehealth (HOSPITAL_BASED_OUTPATIENT_CLINIC_OR_DEPARTMENT_OTHER): Payer: Self-pay | Admitting: *Deleted

## 2024-03-24 NOTE — Telephone Encounter (Signed)
   Pre-operative Risk Assessment    Patient Name: Erica Ball  DOB: [birth year] MRN: [id number]   Date of last office visit: 01/21/2023 Date of next office visit: None  Request for Surgical Clearance    Procedure:  Robotic Colon Surgery  Date of Surgery:  Clearance TBD                                 Surgeon:  Orville Schultze Surgeon's Group or Practice Name:  [facility] Phone number:  [phone] Fax number:  [phone]   Type of Clearance Requested:   - Medical  - Pharmacy:  Hold Aspirin  Not Indicated   Type of Anesthesia:  General    Additional requests/questions:    Signed, Edsel Grayce Sanders   03/24/2024, 4:38 PM

## 2024-03-24 NOTE — Telephone Encounter (Signed)
    Primary Cardiologist:Mihai Croitoru, MD  Chart reviewed as part of pre-operative protocol coverage. Because of Erica Ball past medical history and time since last visit, he/she will require a follow-up Office visit in order to better assess preoperative cardiovascular risk.  Pre-op covering staff: - Please schedule appointment and call patient to inform them. - Please contact requesting surgeon's office via preferred method (i.e, phone, fax) to inform them of need for appointment prior to surgery.  If applicable, this message will also be routed to pharmacy pool and/or primary cardiologist for input on holding anticoagulant/antiplatelet agent as requested below so that this information is available at time of patient's appointment.   Erica CHRISTELLA Beauvais, NP  03/24/2024, 4:51 PM

## 2024-03-25 NOTE — Telephone Encounter (Signed)
 S/w the pt's son who stated I needed to call Adrien at [facility] to schedule appt in office for his mother. I s/w Debra at [facility] and we have scheduled the pt an appt 03/27/24 @ 9:40 Katlyn West, NP. Adrien said she has the address for [address]

## 2024-03-26 NOTE — Progress Notes (Signed)
 Cardiology Office Note    Date:  03/27/2024  ID:  [name], [name] 1951-01-12, MRN [id number] PCP:  Macel Jayson PARAS, MD  Cardiologist:  Jerel Balding, MD  Electrophysiologist:  None   Chief Complaint: Preoperative cardiac evaluation  History of Present Illness: Erica Ball is a 73 y.o. female with visit-pertinent history of hypertension, COPD, mobility issues, prior SVT in the setting of acute illness, EtOH abuse, tobacco abuse.  Patient previously saw Dr. Nishan in the outpatient setting in 2019 following admission for URI, COPD exacerbation and empyema requiring VATS, while in the hospital she had SVT with rates in the 180s that converted with IV metoprolol .  Echocardiogram was reassuring.  She also had troponin elevation suspected due to demand ischemia.  Subsequent stress test in 12/2017 was low risk, no ischemia identified, small defect of mild severity, nonreversible present in the apex location, EF 82% without wall motion abnormality.  It was previously entered into her online chart that she had a history of Wolff-Parkinson-White and remote discharge summary in 2016, this was not mentioned in prior cardiology notes.  Patient reported that her PCP was the first 1 to mention this many years ago and was previously referred out for evaluation and was told she did not have Wolff-Parkinson-White.  Patient was admitted in 2024 with a fall, reportedly was backing up to the sofa with her walker and missed the seat and fell on the carpet, laid there for a few hours and called her brother who assisted back the couch.  The next morning her foot and ankle were swollen and presented the ED.  She was found to have a left tibial plateau fracture, left ankle fracture, right metatarsal fractures.  Hospital course notable for acute encephalopathy, EtOH abuse requiring Librium  taper, subacute stroke seen by neuro recommending aspirin  81 mg daily and hyponatremia.  Lopressor  25 mg twice daily  was started early in admission for hypertension, discontinued on 10/29/2022 in setting of managing BP with hydralazine  and losartan .  In 10/2022 she had an episode of SVT from 330 until 10 AM associated with minimal troponin elevation without obvious chest pain.  She received 2 doses of IV metoprolol  and spontaneously converted. She had 1 brief recurrence of SVT on 10/31/2022 for which she received IV metoprolol . Echo showed EF 60 to 65%, grade 1 DD, mild LAE, mild to moderate AI. On the morning of 11/01/2022 she went back into narrow complex SVT with rates in the 160s with IV metoprolol  breaking the arrhythmia but soon went back into SVT with heart rates in the 140s to 150s. Plan to treat with escalating doses of oral beta-blocker until satisfactory control. It was felt increased burden likely driven by EtOH withdrawal and pain from injuries. She was eventually transition to Toprol -XL 100 mg daily. She was discharged to SNF on 11/05/2022.   Patient returned to ED on 11/16/2022 from [facility] with hyponatremia, sodium 120.  Repeat sodium 119.  UA positive for pyuria.  Patient was given IV fluids and Rocephin  and was admitted.  Hyponatremia was persistent and nephrology was consulted.  Patient was started on urea .  Sodium subsequently improved to 135.  Patient was discharged back to SNF on 11/28/2022.   Patient returned to ED on 12/11/2022 for hyponatremia.  Sodium was 119.  Patient reported she had increased her sodium intake and was drinking 3 Gatorade's per day.  Per [facility] sent by SNF patient had not missed any doses of urea .  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  Discharged on 12/18/2022 with instructions for 1.2 L fluid restriction, avoid Gatorade, continue urea , avoid Lasix .   Patient was last in clinic on 01/21/2023, 100 main stable from a cardiac standpoint.  Patient was being followed by nephrology for recurrent hyponatremia and was being referred to endocrinology.  She  was receiving IV fluids and taking urea  to keep her sodium levels increased.  On 01/18/2024 patient presented to ED with complaints of left-sided chest pain under the left breast.  Troponins were flat, BNP mildly elevated.  There is no evidence of PE.  It was noted that she had had some improvement with Tylenol  from her facility.  It was noted that her oxygen saturation remained low ranging from 79 to 84% on room air.  It was noted that her left-sided pain was pleuritic and worse with deep inspiration also reported worsening chronic cough and dyspnea.  Per notes patient had had a fall a few weeks prior, noted that her pain and breathing improved on steroids, antibiotics and nebulizer treatments was started on oral prednisone .  Patient was discharged back to SNF in stable condition.  Patient given return to the emergency department on 03/09/2024 with back pain.  CT lumbar spine showed subacute 45% compression fracture along the superior endplate at L1 with a 6 mm posterior bony retropulsion and mild sclerosis.  IR was consulted for kyphoplasty, completed on 11/14.  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  Patient denied any abdominal pain, nausea or vomiting.  Today she presents for follow-up and preoperative cardiac evaluation.  She reports that she is doing ok, notes ongoing back pain related to recent procedure and fall.  She denies any chest pain, increased lower extremity edema, orthopnea or PND.  Patient notes stable dyspnea on exertion, notes this has slightly progressed in the last year, reports that she intermittently wears oxygen.  She denies any palpitations, presyncope or syncope.  Patient reports that prior to her recent back procedure she was able to walk some with a walker however on discussion does not appear she would be able to achieve 4 METS of activity.  Patient resides at [facility] ROS: .    Today she denies chest pain, palpitations, melena, hematuria, hemoptysis, diaphoresis, weakness, presyncope, syncope, orthopnea, and PND.  All other systems are reviewed and otherwise negative. Studies Reviewed: SABRA   EKG:  EKG is ordered today, personally reviewed, demonstrating  EKG Interpretation Date/Time:  Friday March 27 2024 09:47:43 EST Ventricular Rate:  73 PR Interval:  174 QRS Duration:  62 QT Interval:  394 QTC Calculation: 434 R Axis:   28  Text Interpretation: Normal sinus rhythm Normal ECG Confirmed by Waleska Buttery [phone]) on 03/27/2024 11:40:01 AM   CV Studies: Cardiac studies reviewed are outlined and summarized above. Otherwise please see EMR for full report. Cardiac Studies & Procedures   ______________________________________________________________________________________________   STRESS TESTS  MYOCARDIAL PERFUSION IMAGING 12/12/2017  Interpretation Summary  Nuclear stress EF: 82%. No wall motion abnormalities.  There was no ST segment deviation noted during stress.  Defect 1: There is a small defect of mild severity, non reversible present in the apex location.  This is a low risk study. No ischemia identified.  Oneil Parchment, MD   ECHOCARDIOGRAM  ECHOCARDIOGRAM COMPLETE 10/30/2022  Narrative ECHOCARDIOGRAM REPORT    Patient Name:   PRINCESS KARNES Serfass Date of Exam: 10/30/2022 Medical Rec #:  [id number]         Height:       68.0 in Accession #:    [id number]        Weight:       140.0 lb Date of Birth:  August 17, 1950        BSA:          1.756 m Patient Age:    71 years          BP:           150/84 mmHg Patient Gender: F                 HR:           80 bpm. Exam Location:  Inpatient  Procedure: 2D Echo, Color Doppler and Cardiac Doppler  Indications:    CVA  History:        Patient has prior history of Echocardiogram examinations. COPD; Risk Factors:Hypertension and Current Smoker. Sepsis. ETOH abuse.  Sonographer:    Madeline Finder Referring Phys: [phone] RALPH A NETTEY  IMPRESSIONS   1. Left ventricular ejection fraction, by estimation, is 60 to 65%. The left ventricle has normal function. The left ventricle has no regional wall motion abnormalities. Left ventricular diastolic parameters are consistent with Grade I diastolic dysfunction (impaired relaxation). 2. Right ventricular systolic function is normal. The right ventricular size is normal. 3. Left atrial size was mildly dilated. 4. The mitral valve is normal in structure. No evidence of mitral valve regurgitation. No evidence of mitral stenosis. 5. The aortic valve is normal in structure. Aortic valve regurgitation is mild to moderate. No aortic stenosis is present. 6. The inferior vena cava is normal in size with greater than 50% respiratory variability, suggesting right atrial pressure of 3 mmHg.  FINDINGS Left Ventricle: Left ventricular ejection fraction, by estimation, is 60 to 65%. The left ventricle has normal function. The left ventricle has no regional wall motion abnormalities. The left ventricular internal cavity size was normal in size. There is no left ventricular hypertrophy. Left ventricular diastolic parameters are consistent with Grade I diastolic dysfunction (impaired relaxation).  Right Ventricle: The right ventricular size is normal. No increase in right ventricular wall thickness. Right ventricular systolic function is normal.  Left Atrium: Left atrial size was mildly dilated.  Right Atrium: Right atrial size was normal in size.  Pericardium: There is no evidence of pericardial effusion.  Mitral Valve: The mitral valve is normal in structure. No evidence of mitral valve regurgitation. No evidence of mitral valve stenosis.  Tricuspid Valve: The tricuspid valve is normal in structure. Tricuspid valve regurgitation is mild . No evidence of tricuspid stenosis.  Aortic Valve: The aortic valve is normal in structure. Aortic valve  regurgitation is mild to moderate. Aortic regurgitation PHT measures 677 msec. No aortic stenosis is present.  Pulmonic Valve: The pulmonic valve was normal in structure. Pulmonic valve regurgitation is not visualized. No evidence of pulmonic stenosis.  Aorta: The aortic root is normal in size and structure.  Venous: The inferior vena cava is normal in size with greater than 50% respiratory variability, suggesting right atrial pressure of 3 mmHg.  IAS/Shunts: No atrial level shunt detected by color flow Doppler.   LEFT VENTRICLE PLAX 2D LVIDd:         4.40 cm   Diastology LVIDs:         2.90 cm   LV e' medial:    5.33 cm/s LV PW:  0.60 cm   LV E/e' medial:  13.7 LV IVS:        0.90 cm   LV e' lateral:   8.59 cm/s LVOT diam:     1.90 cm   LV E/e' lateral: 8.5 LV SV:         71 LV SV Index:   41 LVOT Area:     2.84 cm   RIGHT VENTRICLE RV S prime:     15.40 cm/s TAPSE (M-mode): 2.4 cm  LEFT ATRIUM             Index        RIGHT ATRIUM           Index LA Vol (A2C):   74.8 ml 42.59 ml/m  RA Area:     12.70 cm LA Vol (A4C):   56.0 ml 31.89 ml/m  RA Volume:   26.00 ml  14.80 ml/m LA Biplane Vol: 70.7 ml 40.26 ml/m AORTIC VALVE             PULMONIC VALVE LVOT Vmax:   129.00 cm/s PR End Diast Vel: 11.16 msec LVOT Vmean:  83.300 cm/s LVOT VTI:    0.252 m AI PHT:      677 msec  AORTA Ao Root diam: 3.50 cm Ao Asc diam:  3.20 cm  MITRAL VALVE                TRICUSPID VALVE MV Area (PHT): 3.77 cm     TR Peak grad:   26.8 mmHg MV Decel Time: 201 msec     TR Vmax:        259.00 cm/s MV E velocity: 73.20 cm/s MV A velocity: 148.00 cm/s  SHUNTS MV E/A ratio:  0.49         Systemic VTI:  0.25 m Systemic Diam: 1.90 cm  Kardie Tobb DO Electronically signed by Dub Huntsman DO Signature Date/Time: 10/30/2022/3:20:07 PM    Final          ______________________________________________________________________________________________       Current Reported  Medications:.    Current Meds  Medication Sig   acetaminophen  (TYLENOL ) 325 MG tablet Take 650 mg by mouth every 6 (six) hours as needed (general discomfort).   albuterol  (PROVENTIL  HFA;VENTOLIN  HFA) 108 (90 Base) MCG/ACT inhaler Inhale 2 puffs into the lungs every 4 (four) hours as needed for shortness of breath or wheezing.   amLODipine  (NORVASC ) 5 MG tablet Take 5 mg by mouth daily.   aspirin  EC 81 MG tablet Take 1 tablet (81 mg total) by mouth daily.   bisacodyl  (DULCOLAX) 10 MG suppository Place 10 mg rectally daily as needed (constipation).   calcium  carbonate (OSCAL) 1500 (600 Ca) MG TABS tablet Take 600 mg of elemental calcium  by mouth daily.   cyanocobalamin  1000 MCG tablet Take 1 tablet (1,000 mcg total) by mouth daily.   Emollient (BAG BALM) OINT Apply 1 Application topically daily. Apply to lower legs   folic acid  (FOLVITE ) 1 MG tablet Take 1 tablet (1 mg total) by mouth daily.   hydrALAZINE  (APRESOLINE ) 25 MG tablet Take 1 tablet (25 mg total) by mouth 3 (three) times daily.   hydrOXYzine  (ATARAX ) 25 MG tablet Take 25 mg by mouth every 6 (six) hours as needed for anxiety.   magnesium  hydroxide (MILK OF MAGNESIA) 400 MG/5ML suspension Take 30 mLs by mouth daily as needed (no bm in 3 days).   magnesium  oxide (MAG-OX) 400 (240 Mg) MG tablet Take 1 tablet (400 mg  total) by mouth daily.   meloxicam (MOBIC) 15 MG tablet Take 15 mg by mouth daily.   Menthol, Topical Analgesic, 4 % GEL Apply 1 application  topically every 8 (eight) hours as needed (joint pain).   metoprolol  succinate (TOPROL -XL) 100 MG 24 hr tablet Take 1 tablet (100 mg total) by mouth in the morning. Take with or immediately following a meal.   metoprolol  tartrate (LOPRESSOR ) 25 MG tablet Take 1 tablet (25 mg) TWO hours prior to CT scan   ondansetron  (ZOFRAN ) 4 MG tablet Take 1 tablet (4 mg total) by mouth every 6 (six) hours as needed for nausea.   pantoprazole  (PROTONIX ) 40 MG tablet Take 1 tablet (40 mg total) by mouth  daily.   polyethylene glycol (MIRALAX  / GLYCOLAX ) 17 g packet Take 17 g by mouth daily.   promethazine -dextromethorphan (PROMETHAZINE -DM) 6.25-15 MG/5ML syrup Take 5 mLs by mouth every 6 (six) hours as needed for cough.   rosuvastatin  (CRESTOR ) 10 MG tablet Take 1 tablet (10 mg total) by mouth daily. (Patient taking differently: Take 10 mg by mouth every evening.)   senna-docusate (SENOKOT-S) 8.6-50 MG tablet Take 1 tablet by mouth 2 (two) times daily.   sodium chloride  1 g tablet Take 1-2 g by mouth See admin instructions. Take 2g by mouth in the morning and at bedtime. Take 1g in the afternoon   Sodium Phosphates (ENEMA RE) Place 1 Dose rectally daily as needed (constipation not relieved by the dulcolax).   thiamine  (VITAMIN B-1) 100 MG tablet Take 1 tablet (100 mg total) by mouth daily.   torsemide (DEMADEX) 20 MG tablet Take 20 mg by mouth daily.   traMADol  (ULTRAM ) 50 MG tablet Take 1 tablet (50 mg total) by mouth every 6 (six) hours as needed (back pain).   Physical Exam:    VS:  BP 128/64   Pulse 76   Ht 5' 8.5 (1.74 m)   Wt 180 lb (81.6 kg)   SpO2 94%   BMI 26.97 kg/m    Wt Readings from Last 3 Encounters:  03/27/24 180 lb (81.6 kg)  03/20/24 185 lb (83.9 kg)  01/18/24 170 lb (77.1 kg)    GEN: Well nourished, well developed in no acute distress NECK: No JVD; No carotid bruits CARDIAC: RRR, no murmurs, rubs, gallops RESPIRATORY:  Clear to auscultation without rales, wheezing or rhonchi  ABDOMEN: Soft, non-tender, non-distended EXTREMITIES:  No edema; No acute deformity     Asessement and Plan:.    Preoperative cardiac evaluation: Patient is currently pending robotic colon surgery, date TBD. Patient unable to achieve greater than 4 METS of activity.  She has noted dyspnea on exertion that has progressed in the last year.  Patient will require echocardiogram and coronary CTA for further evaluation.  Dyspnea on exertion: Patient with history of COPD, she has noted some  progressive dyspnea on exertion in recent months requiring intermittent use of oxygen.  CT angio chest in 01/2024 with evidence of coronary, aortic arch and branch vessel atherosclerotic vascular disease. She appears euvolemic on exam.  She is unable to achieve greater than 4 METS of activity as noted above for her preoperative cardiac evaluation, given history of COPD not a great candidate for a Lexiscan  Myoview .  Will pursue coronary CTA for ischemic evaluation and repeat echocardiogram.  She will take metoprolol  tartrate 50 mg prior to coronary CTA.  Recent lab work is stable.  Patient in agreement with plan.  Discussed need for nitroglycerin  during testing.  Reviewed ED precautions.  Continue amlodipine  5 mg daily, aspirin  81 mg daily, hydralazine  25 mg 3 times daily, metoprolol  succinate 100 mg daily, Crestor  10 mg daily and torsemide 20 mg daily.  SVT: Patient with history of SVT.  Today she denies any palpitations or feeling or irregular heart beats.  Continue metoprolol .  Chronic diastolic heart failure: Echo in 10/2022 showed EF 60 to 65%, grade 1 DD, mild LAE. Notes some occassional lower extremity edema, reports has been well controlled with torsemide 20 mg daily.  Will repeat echocardiogram given dyspnea on exertion as noted above.  Hypertension: Blood pressure today 128/64.  Continue current antihypertensive regimen.  Tobacco abuse: No longer smoking, reports she stopped smoking a year ago.   COPD: Patient with history of COPD, has started requiring intermittent use of oxygen in the last year.  She has follow-up with pulmonology next week.  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 Patient followed by nephrology   Disposition: F/u with Maezie Justin, NP following testing.   Signed, Xochitl Egle D Terricka Onofrio, NP

## 2024-03-27 ENCOUNTER — Encounter: Payer: Self-pay | Admitting: Cardiology

## 2024-03-27 ENCOUNTER — Other Ambulatory Visit (HOSPITAL_COMMUNITY): Payer: Self-pay

## 2024-03-27 ENCOUNTER — Ambulatory Visit: Attending: Cardiology | Admitting: Cardiology

## 2024-03-27 VITALS — BP 128/64 | HR 76 | Ht 68.5 in | Wt 180.0 lb

## 2024-03-27 DIAGNOSIS — I10 Essential (primary) hypertension: Secondary | ICD-10-CM

## 2024-03-27 DIAGNOSIS — Z720 Tobacco use: Secondary | ICD-10-CM

## 2024-03-27 DIAGNOSIS — R0609 Other forms of dyspnea: Secondary | ICD-10-CM | POA: Diagnosis not present

## 2024-03-27 DIAGNOSIS — I471 Supraventricular tachycardia, unspecified: Secondary | ICD-10-CM

## 2024-03-27 DIAGNOSIS — E222 Syndrome of inappropriate secretion of antidiuretic hormone: Secondary | ICD-10-CM

## 2024-03-27 DIAGNOSIS — Z01810 Encounter for preprocedural cardiovascular examination: Secondary | ICD-10-CM

## 2024-03-27 MED ORDER — METOPROLOL TARTRATE 50 MG PO TABS: 50.0000 mg | ORAL_TABLET | Freq: Once | ORAL | 0 refills | Status: DC

## 2024-03-27 MED ORDER — METOPROLOL TARTRATE 25 MG PO TABS: ORAL_TABLET | ORAL | 0 refills | Status: DC

## 2024-03-27 MED ORDER — METOPROLOL TARTRATE 25 MG PO TABS
ORAL_TABLET | ORAL | 0 refills | Status: DC
  Filled 2024-03-27: qty 1, 1d supply, fill #0

## 2024-03-27 NOTE — Progress Notes (Signed)
 Please make sure they get the message that they should not mess with her beta-blocker in the perioperative period.

## 2024-03-27 NOTE — Addendum Note (Signed)
 Addended by: MALVINA CHARLENA CROME on: 03/27/2024 04:40 PM   Modules accepted: Orders

## 2024-03-27 NOTE — Patient Instructions (Signed)
 Thank you for choosing [facility]!     Medication Instructions:  On the day of the CTA, take Metoprolol  50mg  2 hours before procedure. *If you need a refill on your cardiac medications before your next appointment, please call your pharmacy*   Lab Work: No labs were ordered during today's visit.  If you have labs (blood work) drawn today and your tests are completely normal, you will receive your results only by: MyChart Message (if you have MyChart) OR A paper copy in the mail If you have any lab test that is abnormal or we need to change your treatment, we will call you to review the results.   Testing/Procedures:   Your cardiac CT will be scheduled at one of the below locations:   [facility] [address] [place], [place] [address] [phone] (Severe contrast allergies only)  OR   [facility] [address] [place], [place] [address] [phone]  OR   [facility] [address] [place], [place] [address] [phone]  OR   [facility] and Vascular Tower [address]  [place], [place] [address]  OR   [facility] [address] [place], [place] [phone]  If scheduled at [facility], please arrive at the [facility] and Children's Entrance (Entrance C2) of [facility] 30 minutes prior to test start time. You can use the FREE valet parking offered at entrance C (encouraged to control the heart rate for the test)  Proceed to the [facility] Radiology Department (first floor) to check-in and test prep.  All radiology patients and guests should use entrance C2 at [facility], accessed from [facility], even though the hospital's physical address listed is [address]  If scheduled at the Heart and Vascular Tower at [address], please enter the parking lot using the [address] entrance and use the FREE valet service at  the patient drop-off area. Enter the building and check-in with registration on the main floor.  If scheduled at [facility], please arrive to the Heart and Vascular Center 15 mins early for check-in and test prep.  There is spacious parking and easy access to the radiology department from the [facility] Heart and Vascular entrance. Please enter here and check-in with the desk attendant.   If scheduled at [facility], please arrive 30 minutes early for check-in and test prep.  Please follow these instructions carefully (unless otherwise directed):  An IV will be required for this test and Nitroglycerin  will be given.  Hold all erectile dysfunction medications at least 3 days (72 hrs) prior to test. (Ie viagra, cialis, sildenafil, tadalafil, etc)   On the Night Before the Test: Be sure to Drink plenty of water. Do not consume any caffeinated/decaffeinated beverages or chocolate 12 hours prior to your test. Do not take any antihistamines 12 hours prior to your test.  If the patient has contrast allergy: Patient will need a prescription for Prednisone  and very clear instructions (as follows): Prednisone  50 mg - take 13 hours prior to test Take another Prednisone  50 mg 7 hours prior to test Take another Prednisone  50 mg 1 hour prior to test Take Benadryl  50 mg 1 hour prior to test Patient must complete all four doses of above prophylactic medications. Patient will need a ride after test due to Benadryl .  On the Day of the Test: Drink plenty of water until 1 hour  prior to the test. Do not eat any food 1 hour prior to test. You may take your regular medications prior to the test.  Take metoprolol  (Lopressor ) two hours prior to test. If you take Furosemide /Hydrochlorothiazide/Spironolactone/Chlorthalidone, please HOLD on the morning of the test. Patients who wear a continuous glucose monitor MUST remove the device prior to scanning. FEMALES- please wear  underwire-free bra if available, avoid dresses & tight clothing       After the Test: Drink plenty of water. After receiving IV contrast, you may experience a mild flushed feeling. This is normal. On occasion, you may experience a mild rash up to 24 hours after the test. This is not dangerous. If this occurs, you can take Benadryl  25 mg, Zyrtec, Claritin, or Allegra and increase your fluid intake. (Patients taking Tikosyn should avoid Benadryl , and may take Zyrtec, Claritin, or Allegra) If you experience trouble breathing, this can be serious. If it is severe call 911 IMMEDIATELY. If it is mild, please call our office.  We will call to schedule your test 2-4 weeks out understanding that some insurance companies will need an authorization prior to the service being performed.   For more information and frequently asked questions, please visit our website : [email or web address]  For non-scheduling related questions, please contact the cardiac imaging nurse navigator should you have any questions/concerns: Cardiac Imaging Nurse Navigators Direct Office Dial: [phone]   For scheduling needs, including cancellations and rescheduling, please call Brittany, [phone]. Your physician has requested that you have an echocardiogram. Echocardiography is a painless test that uses sound waves to create images of your heart. It provides your doctor with information about the size and shape of your heart and how well your heart's chambers and valves are working. This procedure takes approximately one hour. There are no restrictions for this procedure. Please do NOT wear cologne, perfume, aftershave, or lotions (deodorant is allowed). Please arrive 15 minutes prior to your appointment time.  Please note: We ask at that you not bring children with you during ultrasound (echo/ vascular) testing. Due to room size and safety concerns, children are not allowed in the ultrasound rooms during exams.  Our front office staff cannot provide observation of children in our lobby area while testing is being conducted. An adult accompanying a patient to their appointment will only be allowed in the ultrasound room at the discretion of the ultrasound technician under special circumstances. We apologize for any inconvenience.   Your next appointment:   6 week(s)   Provider:   Katlyn West, NP           Follow-Up: At [facility], you and your health needs are our priority.  As part of our continuing mission to provide you with exceptional heart care, we have created designated Provider Care Teams.  These Care Teams include your primary Cardiologist (physician) and Advanced Practice Providers (APPs -  Physician Assistants and Nurse Practitioners) who all work together to provide you with the care you need, when you need it. We recommend signing up for the patient portal called MyChart.  Sign up information is provided on this After Visit Summary.  MyChart is used to connect with patients for Virtual Visits (Telemedicine).  Patients are able to view lab/test results, encounter notes, upcoming appointments, etc.  Non-urgent messages can be sent to your provider as well.   To learn more about what you can do with MyChart, go to [email or web address].

## 2024-03-27 NOTE — Addendum Note (Signed)
 Addended by: MALVINA CHARLENA CROME on: 03/27/2024 05:17 PM   Modules accepted: Orders

## 2024-03-29 NOTE — Anesthesia Postprocedure Evaluation (Signed)
 Anesthesia Post Note  Patient: Erica Ball  Procedure(s) Performed: RADIOLOGY WITH ANESTHESIA     Patient location during evaluation: PACU Anesthesia Type: General Level of consciousness: awake and alert Pain management: pain level controlled Vital Signs Assessment: post-procedure vital signs reviewed and stable Respiratory status: spontaneous breathing, nonlabored ventilation and respiratory function stable Cardiovascular status: blood pressure returned to baseline and stable Postop Assessment: no apparent nausea or vomiting Anesthetic complications: no   No notable events documented.             Durk Carmen,W. EDMOND

## 2024-03-30 ENCOUNTER — Other Ambulatory Visit (HOSPITAL_COMMUNITY): Payer: Self-pay

## 2024-03-30 ENCOUNTER — Ambulatory Visit

## 2024-03-30 ENCOUNTER — Telehealth: Payer: Self-pay

## 2024-03-30 VITALS — BP 104/58 | HR 65 | Ht 67.0 in | Wt 180.0 lb

## 2024-03-30 DIAGNOSIS — J9611 Chronic respiratory failure with hypoxia: Secondary | ICD-10-CM

## 2024-03-30 DIAGNOSIS — R9389 Abnormal findings on diagnostic imaging of other specified body structures: Secondary | ICD-10-CM | POA: Diagnosis not present

## 2024-03-30 DIAGNOSIS — J449 Chronic obstructive pulmonary disease, unspecified: Secondary | ICD-10-CM

## 2024-03-30 DIAGNOSIS — J439 Emphysema, unspecified: Secondary | ICD-10-CM

## 2024-03-30 DIAGNOSIS — Z87891 Personal history of nicotine dependence: Secondary | ICD-10-CM | POA: Diagnosis not present

## 2024-03-30 DIAGNOSIS — Z01818 Encounter for other preprocedural examination: Secondary | ICD-10-CM

## 2024-03-30 DIAGNOSIS — R600 Localized edema: Secondary | ICD-10-CM

## 2024-03-30 MED ORDER — ALBUTEROL SULFATE HFA 108 (90 BASE) MCG/ACT IN AERS
2.0000 | INHALATION_SPRAY | RESPIRATORY_TRACT | 5 refills | Status: DC | PRN
  Filled 2024-03-30: qty 6.7, 25d supply, fill #0

## 2024-03-30 NOTE — Telephone Encounter (Signed)
She is scheduled. Thank you.

## 2024-03-30 NOTE — Progress Notes (Signed)
 New Patient Pulmonology Office Visit   Subjective:  Patient ID: Erica Ball, female    DOB: 04-15-1951  MRN: [id number]  Referred by: Arlice Reichert, MD  CC:  Chief Complaint  Patient presents with   Consult    Surgical clearance for abdominal surgery.   COPD    HPI Erica Ball is a 73 y.o. female who is referred to this clinic for preoperative clearance Has seen Dr Darlean once in 2019 for post hospital follow up for empyema  Discussed the use of AI scribe software for clinical note transcription with the patient, who gave verbal consent to proceed.  History of Present Illness Erica Ball is a 73 year old female with emphysema who presents for pulmonary evaluation prior to surgery.  She is seeking pulmonary clearance for an upcoming appendectomy surgery, with the date yet to be scheduled pending clearance from a lung specialist.   She has a significant smoking history, having smoked since the age of 66 until quitting one year ago while in a rehabilitation facility. She resides in a rehabilitation facility for the past year, where she quit smoking and has not used inhalers since her admission. She has a history of smoking for approximately 59 years.  She experiences no daily shortness of breath and is able to walk, attributing her limited walking to 'laziness' rather than respiratory issues. She occasionally experiences shortness of breath but does not use inhalers regularly. Oxygen is prescribed on an as-needed basis, initiated after low oxygen levels were noted during a hospital stay and continued upon her return to the rehabilitation facility. Denies frequent bronchitis  Her past medical history includes a vertebral fracture, cyst, gallstones, aortic atherosclerosis, pneumonia, and kidney injury from a previous hospitalization in 2019. She has experienced high blood pressure in the past, for which she is on medication. She has had issues with her adrenal glands,  leading to abnormal sodium levels, although these are described as 'false abnormals'.       ROS Review of symptoms negative except mentioned above   Allergies: Dextromethorphan  Current Outpatient Medications:    acetaminophen  (TYLENOL ) 325 MG tablet, Take 650 mg by mouth every 6 (six) hours as needed (general discomfort)., Disp: , Rfl:    amLODipine  (NORVASC ) 5 MG tablet, Take 5 mg by mouth daily., Disp: , Rfl:    aspirin  EC 81 MG tablet, Take 1 tablet (81 mg total) by mouth daily., Disp: , Rfl:    bisacodyl  (DULCOLAX) 10 MG suppository, Place 10 mg rectally daily as needed (constipation)., Disp: , Rfl:    calcium  carbonate (OSCAL) 1500 (600 Ca) MG TABS tablet, Take 600 mg of elemental calcium  by mouth daily., Disp: , Rfl:    cyanocobalamin  1000 MCG tablet, Take 1 tablet (1,000 mcg total) by mouth daily., Disp: 30 tablet, Rfl: 1   Emollient (BAG BALM) OINT, Apply 1 Application topically daily. Apply to lower legs, Disp: , Rfl:    folic acid  (FOLVITE ) 1 MG tablet, Take 1 tablet (1 mg total) by mouth daily., Disp: , Rfl:    hydrALAZINE  (APRESOLINE ) 25 MG tablet, Take 1 tablet (25 mg total) by mouth 3 (three) times daily., Disp: , Rfl:    hydrOXYzine  (ATARAX ) 25 MG tablet, Take 25 mg by mouth every 6 (six) hours as needed for anxiety., Disp: , Rfl:    magnesium  hydroxide (MILK OF MAGNESIA) 400 MG/5ML suspension, Take 30 mLs by mouth daily as needed (no bm in 3 days)., Disp: , Rfl:  magnesium  oxide (MAG-OX) 400 (240 Mg) MG tablet, Take 1 tablet (400 mg total) by mouth daily., Disp: 15 tablet, Rfl: 0   meloxicam (MOBIC) 15 MG tablet, Take 15 mg by mouth daily., Disp: , Rfl:    Menthol, Topical Analgesic, 4 % GEL, Apply 1 application  topically every 8 (eight) hours as needed (joint pain)., Disp: , Rfl:    metoprolol  succinate (TOPROL -XL) 100 MG 24 hr tablet, Take 1 tablet (100 mg total) by mouth in the morning. Take with or immediately following a meal., Disp: , Rfl:    metoprolol  tartrate  (LOPRESSOR ) 50 MG tablet, Take 1 tablet (50 mg total) by mouth once for 1 dose. PLEASE TAKE METOPROLOL  2  HOURS PRIOR TO CTA SCAN., Disp: 1 tablet, Rfl: 0   ondansetron  (ZOFRAN ) 4 MG tablet, Take 1 tablet (4 mg total) by mouth every 6 (six) hours as needed for nausea., Disp: 20 tablet, Rfl: 0   pantoprazole  (PROTONIX ) 40 MG tablet, Take 1 tablet (40 mg total) by mouth daily., Disp: 30 tablet, Rfl: 1   polyethylene glycol (MIRALAX  / GLYCOLAX ) 17 g packet, Take 17 g by mouth daily., Disp: , Rfl:    promethazine -dextromethorphan (PROMETHAZINE -DM) 6.25-15 MG/5ML syrup, Take 5 mLs by mouth every 6 (six) hours as needed for cough., Disp: , Rfl:    rosuvastatin  (CRESTOR ) 10 MG tablet, Take 1 tablet (10 mg total) by mouth daily. (Patient taking differently: Take 10 mg by mouth every evening.), Disp: , Rfl:    senna-docusate (SENOKOT-S) 8.6-50 MG tablet, Take 1 tablet by mouth 2 (two) times daily., Disp: , Rfl:    sodium chloride  1 g tablet, Take 1-2 g by mouth See admin instructions. Take 2g by mouth in the morning and at bedtime. Take 1g in the afternoon, Disp: , Rfl:    Sodium Phosphates (ENEMA RE), Place 1 Dose rectally daily as needed (constipation not relieved by the dulcolax)., Disp: , Rfl:    thiamine  (VITAMIN B-1) 100 MG tablet, Take 1 tablet (100 mg total) by mouth daily., Disp: , Rfl:    torsemide (DEMADEX) 20 MG tablet, Take 20 mg by mouth daily., Disp: , Rfl:    traMADol  (ULTRAM ) 50 MG tablet, Take 1 tablet (50 mg total) by mouth every 6 (six) hours as needed (back pain)., Disp: 15 tablet, Rfl: 0   albuterol  (VENTOLIN  HFA) 108 (90 Base) MCG/ACT inhaler, Inhale 2 puffs into the lungs every 4 (four) hours as needed for shortness of breath or wheezing., Disp: 6.7 g, Rfl: 5 Past Medical History:  Diagnosis Date   Acute kidney injury 07/10/2017   Acute respiratory failure with hypoxia (HCC) 07/10/2017   Alcohol abuse    Allergy    Cataract    Diverticulitis 10/30/2011   Elevated troponin  07/10/2017   Hypertension    Lobar pneumonia 07/10/2017   Pleural effusion on right 07/10/2017   Sepsis (HCC) 07/10/2017   SVT (supraventricular tachycardia)    Tobacco abuse    UTI (urinary tract infection) 07/10/2017   Vitamin D  deficiency 10/30/2011   Past Surgical History:  Procedure Laterality Date   ABDOMINAL HYSTERECTOMY     CATARACT EXTRACTION     FRACTURE SURGERY     ANKLE   IR KYPHO LUMBAR INC FX REDUCE BONE BX UNI/BIL CANNULATION INC/IMAGING  03/20/2024   IR THORACENTESIS ASP PLEURAL SPACE W/IMG GUIDE  07/11/2017   RADIOLOGY WITH ANESTHESIA N/A 03/20/2024   Procedure: RADIOLOGY WITH ANESTHESIA;  Surgeon: Jenna Cordella LABOR, MD;  Location: [facility] OR;  Service: Radiology;  Laterality: N/A;  L1 Kyphoplasty   VIDEO ASSISTED THORACOSCOPY (VATS)/EMPYEMA Right 07/12/2017   Procedure: right VIDEO ASSISTED THORACOSCOPY (VATS) for drainage of EMPYEMA and decortication;  Surgeon: Kerrin Elspeth BROCKS, MD;  Location: [facility] OR;  Service: Thoracic;  Laterality: Right;   VIDEO BRONCHOSCOPY N/A 07/12/2017   Procedure: VIDEO BRONCHOSCOPY;  Surgeon: Kerrin Elspeth BROCKS, MD;  Location: [facility] OR;  Service: Thoracic;  Laterality: N/A;   Family History  Problem Relation Age of Onset   Hypertension Mother    Heart disease Mother    Hypertension Father    Heart disease Father    Stroke Brother    Hypertension Brother    Heart disease Brother    Social History   Socioeconomic History   Marital status: Divorced    Spouse name: Not on file   Number of children: Not on file   Years of education: Not on file   Highest education level: Not on file  Occupational History   Not on file  Tobacco Use   Smoking status: Every Day    Current packs/day: 1.50    Average packs/day: 1.5 packs/day for 53.0 years (79.5 ttl pk-yrs)    Types: Cigarettes   Smokeless tobacco: Never   Tobacco comments:    Started smoking at age 78. Quit a year ago. 03/30/2024  Substance and Sexual Activity   Alcohol use:  Yes    Alcohol/week: 21.0 standard drinks of alcohol    Types: 21 Shots of liquor per week    Comment: drinks three cups of vodka per day   Drug use: No   Sexual activity: Not Currently  Other Topics Concern   Not on file  Social History Narrative   Not on file   Social Drivers of Health   Financial Resource Strain: Patient Unable To Answer (11/17/2022)   Overall Financial Resource Strain (CARDIA)    Difficulty of Paying Living Expenses: Patient unable to answer  Food Insecurity: No Food Insecurity (03/10/2024)   Hunger Vital Sign    Worried About Running Out of Food in the Last Year: Never true    Ran Out of Food in the Last Year: Never true  Transportation Needs: Patient Declined (03/10/2024)   PRAPARE - Transportation    Lack of Transportation (Medical): Patient declined    Lack of Transportation (Non-Medical): Patient declined  Physical Activity: Sufficiently Active (11/17/2022)   Exercise Vital Sign    Days of Exercise per Week: 7 days    Minutes of Exercise per Session: 30 min  Stress: No Stress Concern Present (11/17/2022)   [employer] of Occupational Health - Occupational Stress Questionnaire    Feeling of Stress : Only a little  Social Connections: Socially Isolated (03/10/2024)   Social Connection and Isolation Panel    Frequency of Communication with Friends and Family: More than three times a week    Frequency of Social Gatherings with Friends and Family: More than three times a week    Attends Religious Services: Never    Database Administrator or Organizations: No    Attends Banker Meetings: Never    Marital Status: Divorced  Catering Manager Violence: Unknown (03/10/2024)   Humiliation, Afraid, Rape, and Kick questionnaire    Fear of Current or Ex-Partner: Patient declined    Emotionally Abused: Patient declined    Physically Abused: Patient declined    Sexually Abused: No         Objective:  BP (!) 104/58   Pulse  65   Ht 5' 7 (1.702  m)   Wt 180 lb (81.6 kg)   SpO2 92% Comment: 1L TANK  BMI 28.19 kg/m    Physical Exam Constitutional:      General: She is not in acute distress.    Appearance: Normal appearance.     Comments: On a wheel chair  HENT:     Mouth/Throat:     Mouth: Mucous membranes are moist.  Cardiovascular:     Rate and Rhythm: Normal rate.  Pulmonary:     Effort: No respiratory distress.     Breath sounds: No wheezing or rales.  Musculoskeletal:     Right lower leg: No edema.     Left lower leg: No edema.  Skin:    General: Skin is warm.  Neurological:     Mental Status: She is alert and oriented to person, place, and time.  Psychiatric:        Mood and Affect: Mood normal.     Diagnostic Review:    Pft     No data to display              Results Sept 2025  Narrowing of the lower lobe bronchi may reflect mild bronchomalacia. Airway thickening with mosaic attenuation pattern in the lungs potentially from air trapping. Bandlike atelectasis in the right middle lobe and both lower lobes.  Mildly prominent right hilar lymph node 1.1 cm in short axis diameter. Upper normal sized paratracheal and AP window lymph nodes.  RADIOLOGY Chest CT: No airway narrowing (2019)     Echo 10/2022  1. Left ventricular ejection fraction, by estimation, is 60 to 65%. The  left ventricle has normal function. The left ventricle has no regional  wall motion abnormalities. Left ventricular diastolic parameters are  consistent with Grade I diastolic  dysfunction (impaired relaxation).   2. Right ventricular systolic function is normal. The right ventricular  size is normal.   3. Left atrial size was mildly dilated.   4. The mitral valve is normal in structure. No evidence of mitral valve  regurgitation. No evidence of mitral stenosis.   5. The aortic valve is normal in structure. Aortic valve regurgitation is  mild to moderate. No aortic stenosis is present.   6. The inferior vena cava is  normal in size with greater than 50%  respiratory variability, suggesting right atrial pressure of 3 mmHg.    Assessment & Plan:   Assessment & Plan Chronic obstructive pulmonary disease with emphysema, unspecified emphysema type (HCC) Discussed the symptoms, etiology, pathophysiology, diagnostic test, treatment, flare ups,  prognosis of copd Currently not very symptomatic Use as needed albuterol  As emphysema on ct chest Orders:   Pulmonary function test; Future  Preoperative clearance Patient does not have absolute contraindication to proceed with anesthesia for elective surgery.  Patient doesn't have symptoms of active infection at this time and her copd has been stable over 12 months with no exacerbations. I educated them on the risk of postoperative pulmonary complications with anesthesia, especially with their chronic lung condition. Pt would be considered to have moderate risk for post operative pulmonary complications for non thoracic, non abdominal surgery based on available risk calculators.  would recommend following intra and post operative strategies to reduce risk for pulmonary complications- mostly post operative pneumonia and respiratory failure. 1. Limiting the duration of surgery as much as possible. 2. Complete reversal of neuromuscular blockade at the conclusion of the surgical procedure to avoid hypoventilation and post operative  pulmonary complications 3. Use of post operative continuous positive airway pressure (BIPAP/CPAP) if surgery is prolonged and pt is not able to participate on lung expansion maneuvers like deep breathing exercises and incentive spirometry 4. Adequate Pain control and Early mobilization after surgery to facilitate deep breathing 5. Deep breathing exercises and incentive spirometry q 2 hrs post surgery Pt uses oxygen as needed at home.  Orders:   DG Chest 2 View; Future  Former smoker Congratulated on quitting smoking     Abnormal CT of the  chest Narrowing of the lower lobe bronchi  and prominent hilar lymph nodes in 01/2024 Thought to be secondary to bronchomalacia Will get Chest xray today CT CHEST in January Orders:   CT SUPER D CHEST WO CONTRAST; Future  Chronic respiratory failure with hypoxia [facility]) Previously Saturation on room air well today Likely related to CHF and emphysema    Pedal edema Uses lasix  as needed      Thank you for the opportunity to take part in the care of BREEANNE OBLINGER   Return in about 4 months (around 07/28/2024).   Mariette Cowley Pleas, MD [facility] Office: [phone]

## 2024-03-30 NOTE — Telephone Encounter (Signed)
 Hey thanks for checking. Please schedule it for Jan first week 2026

## 2024-03-30 NOTE — Telephone Encounter (Signed)
 Hello, I was looking at the CT order and didn't know specifically when you wanted the CT to be scheduled. Please advise. Thank you.

## 2024-03-30 NOTE — Patient Instructions (Signed)
 It was a pleasure to see you today. Your pulmonary function test will be scheduled closer to your next follow up visit. Your will receive a call for your CT chest scheduling.

## 2024-03-31 ENCOUNTER — Ambulatory Visit: Admitting: Gastroenterology

## 2024-04-06 ENCOUNTER — Telehealth: Payer: Self-pay

## 2024-04-06 ENCOUNTER — Ambulatory Visit (INDEPENDENT_AMBULATORY_CARE_PROVIDER_SITE_OTHER): Admitting: Gastroenterology

## 2024-04-06 ENCOUNTER — Other Ambulatory Visit (HOSPITAL_COMMUNITY): Payer: Self-pay

## 2024-04-06 ENCOUNTER — Encounter: Payer: Self-pay | Admitting: Gastroenterology

## 2024-04-06 ENCOUNTER — Other Ambulatory Visit: Payer: Self-pay

## 2024-04-06 VITALS — BP 130/72 | HR 69

## 2024-04-06 DIAGNOSIS — R935 Abnormal findings on diagnostic imaging of other abdominal regions, including retroperitoneum: Secondary | ICD-10-CM | POA: Diagnosis not present

## 2024-04-06 DIAGNOSIS — K388 Other specified diseases of appendix: Secondary | ICD-10-CM | POA: Diagnosis not present

## 2024-04-06 DIAGNOSIS — Z1211 Encounter for screening for malignant neoplasm of colon: Secondary | ICD-10-CM | POA: Diagnosis not present

## 2024-04-06 DIAGNOSIS — R1031 Right lower quadrant pain: Secondary | ICD-10-CM | POA: Diagnosis not present

## 2024-04-06 MED ORDER — NA SULFATE-K SULFATE-MG SULF 17.5-3.13-1.6 GM/177ML PO SOLN
1.0000 | Freq: Once | ORAL | 0 refills | Status: AC
  Filled 2024-04-06: qty 354, 1d supply, fill #0

## 2024-04-06 NOTE — Telephone Encounter (Signed)
 Will update all parties involved pt has appt 05/14/24 with Katlyn West, NP.

## 2024-04-06 NOTE — Telephone Encounter (Signed)
  Erica Ball February 06, 1951 [id number]  04/06/24    Dear [name], MD:  We have scheduled the above named patient for a(n) endoscopic procedure.   Please advise as to whether the patient is able to have procedure which is scheduled for 05/26/24.  Please route your response to [employer] or fax response to [phone].  Sincerely,    [facility]

## 2024-04-06 NOTE — Progress Notes (Signed)
 Chief Complaint:mass of appendix Primary GI Doctor: Dr. San  HPI:  Patient is a  73  year old female patient with past medical history of COPD,hypertension, mobility issues, prior SVT in the setting of acute illness, EtOH abuse, and tobacco abuse, who was referred to me by Macel Jayson PARAS, MD on 03/10/24 for a evaluation of mass of appendix.  On 01/18/2024 patient presented to ED with complaints of left-sided chest pain under the left breast.  Troponins were flat, BNP mildly elevated.  There is no evidence of PE.  It was noted that she had had some improvement with Tylenol  from her facility.  It was noted that her oxygen saturation remained low ranging from 79 to 84% on room air.  It was noted that her left-sided pain was pleuritic and worse with deep inspiration also reported worsening chronic cough and dyspnea.  Per notes patient had had a fall a few weeks prior, noted that her pain and breathing improved on steroids, antibiotics and nebulizer treatments was started on oral prednisone .  Patient was discharged back to SNF in stable condition.   Patient returned to ED on 03/09/2024 with back pain.  CT lumbar spine showed subacute 45% compression fracture along the superior endplate at L1 with a 6 mm posterior bony retropulsion and mild sclerosis.  IR was consulted for kyphoplasty, completed on 11/14.  While inpatient  CT abdomen/pelvis showed appendiceal mass, findings were concerning for mucocele, no inflammatorily changes identified.  Patient denied any abdominal pain, nausea or vomiting.  ---03/30/24 seen by pulmonology for preoperative clearance, considered moderate risk. Reviewed entire note.  ---03/27/24 seen by cardiology for preoperative cardiac evaluation. Patient will require echocardiogram and coronary CTA for further evaluation.   Interval History Patient presents for evaluation of abnormal CT scan showing possible mass of appendix,  Accompanied by her son and sister. Patient  lives in assisted living facility and appears in wheelchair today.  Patient reports one regular BM daily. No nausea or vomiting Appetite has decreased. No blood in stool.  She complains of right lower abdominal pain that started a few weeks ago.  Denies GERD or dysphagia.  History of back issues. She takes tramadol  for back and abdominal pain which she states helps. Recently had issues with back spasms.   Former smoker, smoked since the age of 33 until quitting one year ago while in a rehabilitation facility. No alcohol use in 1 year.  Patient on baby ASA 81mg  po daily.  Never had EGD/colon. Patient's son expresses concerns with his mother being able to do bowel prep without any assistance in assisted living.   Pt uses oxygen as needed at home.   Surgical history: thoracoscopy, back injections  Patient's family history includes: prostate CA in father, paternal grandmother with breast CA  Wt Readings from Last 3 Encounters:  03/30/24 180 lb (81.6 kg)  03/27/24 180 lb (81.6 kg)  03/20/24 185 lb (83.9 kg)    Past Medical History:  Diagnosis Date   Acute kidney injury 07/10/2017   Acute respiratory failure with hypoxia (HCC) 07/10/2017   Alcohol abuse    Allergy    Cataract    Diverticulitis 10/30/2011   Elevated troponin 07/10/2017   Hypertension    Lobar pneumonia 07/10/2017   Pleural effusion on right 07/10/2017   Sepsis (HCC) 07/10/2017   SVT (supraventricular tachycardia)    Tobacco abuse    UTI (urinary tract infection) 07/10/2017   Vitamin D  deficiency 10/30/2011    Past Surgical History:  Procedure Laterality Date   ABDOMINAL HYSTERECTOMY     CATARACT EXTRACTION     FRACTURE SURGERY     ANKLE   IR KYPHO LUMBAR INC FX REDUCE BONE BX UNI/BIL CANNULATION INC/IMAGING  03/20/2024   IR THORACENTESIS ASP PLEURAL SPACE W/IMG GUIDE  07/11/2017   RADIOLOGY WITH ANESTHESIA N/A 03/20/2024   Procedure: RADIOLOGY WITH ANESTHESIA;  Surgeon: Jenna Cordella LABOR, MD;   Location: [facility] OR;  Service: Radiology;  Laterality: N/A;  L1 Kyphoplasty   VIDEO ASSISTED THORACOSCOPY (VATS)/EMPYEMA Right 07/12/2017   Procedure: right VIDEO ASSISTED THORACOSCOPY (VATS) for drainage of EMPYEMA and decortication;  Surgeon: Kerrin Elspeth BROCKS, MD;  Location: [facility] OR;  Service: Thoracic;  Laterality: Right;   VIDEO BRONCHOSCOPY N/A 07/12/2017   Procedure: VIDEO BRONCHOSCOPY;  Surgeon: Kerrin Elspeth BROCKS, MD;  Location: [facility] OR;  Service: Thoracic;  Laterality: N/A;    Current Outpatient Medications  Medication Sig Dispense Refill   acetaminophen  (TYLENOL ) 325 MG tablet Take 650 mg by mouth every 6 (six) hours as needed (general discomfort).     albuterol  (VENTOLIN  HFA) 108 (90 Base) MCG/ACT inhaler Inhale 2 puffs into the lungs every 4 (four) hours as needed for shortness of breath or wheezing. 6.7 g 5   amLODipine  (NORVASC ) 5 MG tablet Take 5 mg by mouth daily.     aspirin  EC 81 MG tablet Take 1 tablet (81 mg total) by mouth daily.     bisacodyl  (DULCOLAX) 10 MG suppository Place 10 mg rectally daily as needed (constipation).     calcium  carbonate (OSCAL) 1500 (600 Ca) MG TABS tablet Take 600 mg of elemental calcium  by mouth daily.     cyanocobalamin  1000 MCG tablet Take 1 tablet (1,000 mcg total) by mouth daily. 30 tablet 1   Emollient (BAG BALM) OINT Apply 1 Application topically daily. Apply to lower legs     folic acid  (FOLVITE ) 1 MG tablet Take 1 tablet (1 mg total) by mouth daily.     hydrALAZINE  (APRESOLINE ) 25 MG tablet Take 1 tablet (25 mg total) by mouth 3 (three) times daily.     hydrOXYzine  (ATARAX ) 25 MG tablet Take 25 mg by mouth every 6 (six) hours as needed for anxiety.     magnesium  hydroxide (MILK OF MAGNESIA) 400 MG/5ML suspension Take 30 mLs by mouth daily as needed (no bm in 3 days).     magnesium  oxide (MAG-OX) 400 (240 Mg) MG tablet Take 1 tablet (400 mg total) by mouth daily. 15 tablet 0   meloxicam (MOBIC) 15 MG tablet Take 15 mg by mouth daily.      Menthol, Topical Analgesic, 4 % GEL Apply 1 application  topically every 8 (eight) hours as needed (joint pain).     metoprolol  succinate (TOPROL -XL) 100 MG 24 hr tablet Take 1 tablet (100 mg total) by mouth in the morning. Take with or immediately following a meal.     metoprolol  tartrate (LOPRESSOR ) 50 MG tablet Take 1 tablet (50 mg total) by mouth once for 1 dose. PLEASE TAKE METOPROLOL  2  HOURS PRIOR TO CTA SCAN. 1 tablet 0   Na Sulfate-K Sulfate-Mg Sulfate concentrate (SUPREP) 17.5-3.13-1.6 GM/177ML SOLN Take 1 kit (354 mLs total) by mouth once for 1 dose. 354 mL 0   ondansetron  (ZOFRAN ) 4 MG tablet Take 1 tablet (4 mg total) by mouth every 6 (six) hours as needed for nausea. 20 tablet 0   pantoprazole  (PROTONIX ) 40 MG tablet Take 1 tablet (40 mg total) by mouth daily. 30 tablet  1   polyethylene glycol (MIRALAX  / GLYCOLAX ) 17 g packet Take 17 g by mouth daily.     promethazine -dextromethorphan (PROMETHAZINE -DM) 6.25-15 MG/5ML syrup Take 5 mLs by mouth every 6 (six) hours as needed for cough.     rosuvastatin  (CRESTOR ) 10 MG tablet Take 1 tablet (10 mg total) by mouth daily. (Patient taking differently: Take by mouth every evening.)     senna-docusate (SENOKOT-S) 8.6-50 MG tablet Take 1 tablet by mouth 2 (two) times daily.     sodium chloride  1 g tablet Take 1-2 g by mouth See admin instructions. Take 2g by mouth in the morning and at bedtime. Take 1g in the afternoon     Sodium Phosphates (ENEMA RE) Place 1 Dose rectally daily as needed (constipation not relieved by the dulcolax).     thiamine  (VITAMIN B-1) 100 MG tablet Take 1 tablet (100 mg total) by mouth daily.     torsemide (DEMADEX) 20 MG tablet Take 20 mg by mouth daily.     traMADol  (ULTRAM ) 50 MG tablet Take 1 tablet (50 mg total) by mouth every 6 (six) hours as needed (back pain). 15 tablet 0   No current facility-administered medications for this visit.    Allergies as of 04/06/2024 - Review Complete 04/06/2024  Allergen Reaction  Noted   Dextromethorphan Other (See Comments) 07/01/2012    Family History  Problem Relation Age of Onset   Hypertension Mother    Heart disease Mother    Hypertension Father    Heart disease Father    Stroke Brother    Hypertension Brother    Heart disease Brother     Review of Systems:    Constitutional: No weight loss, fever, chills, weakness or fatigue HEENT: Eyes: No change in vision               Ears, Nose, Throat:  No change in hearing or congestion Skin: No rash or itching Cardiovascular: No chest pain, chest pressure or palpitations   Respiratory: No SOB or cough Gastrointestinal: See HPI and otherwise negative Genitourinary: No dysuria or change in urinary frequency Neurological: No headache, dizziness or syncope Musculoskeletal: No new muscle or joint pain Hematologic: No bleeding or bruising Psychiatric: No history of depression or anxiety    Physical Exam:  Vital signs: BP 130/72   Pulse 69   Constitutional:   Pleasant female appears to be in NAD, Well developed, Well nourished, alert and cooperative Eyes:   PEERL, EOMI. No icterus. Conjunctiva pink. Neck:  Supple Throat: Oral cavity and pharynx without inflammation, swelling or lesion.  Respiratory: Respirations even and unlabored. Lungs clear to auscultation bilaterally.   No wheezes, crackles, or rhonchi.  Cardiovascular: Normal S1, S2. Regular rate and rhythm. No peripheral edema, cyanosis or pallor.  Gastrointestinal:  Soft, nondistended, RLQ tenderness with palpation. No rebound or guarding. Normal bowel sounds. No appreciable masses or hepatomegaly. Rectal:  Not performed.  Msk:  Symmetrical without gross deformities. Trace edema BLE, in wheelchair, needs assist x 1 Neurologic:  Alert and  oriented x4;  grossly normal neurologically.  Skin:   Dry and intact without significant lesions or rashes.  RELEVANT LABS AND IMAGING: CBC    Latest Ref Rng & Units 03/22/2024    7:08 AM 03/19/2024   11:58  PM 03/16/2024    9:28 AM  CBC  WBC 4.0 - 10.5 K/uL 7.2  6.9  11.0   Hemoglobin 12.0 - 15.0 g/dL 86.6  87.8  86.7   Hematocrit 36.0 -  46.0 % 40.2  37.2  39.2   Platelets 150 - 400 K/uL 352  280  256      CMP     Latest Ref Rng & Units 03/22/2024    7:08 AM 03/16/2024    9:28 AM 03/10/2024    5:21 AM  CMP  Glucose 70 - 99 mg/dL 90  896  891   BUN 8 - 23 mg/dL 5  6  14    Creatinine 0.44 - 1.00 mg/dL 9.46  9.36  9.25   Sodium 135 - 145 mmol/L 135  134  138   Potassium 3.5 - 5.1 mmol/L 4.3  3.7  3.9   Chloride 98 - 111 mmol/L 96  95  95   CO2 22 - 32 mmol/L 31  26  34   Calcium  8.9 - 10.3 mg/dL 8.8  9.0  8.6   Total Protein 6.5 - 8.1 g/dL  6.8    Total Bilirubin 0.0 - 1.2 mg/dL  0.9    Alkaline Phos 38 - 126 U/L  91    AST 15 - 41 U/L  22    ALT 0 - 44 U/L  17       Lab Results  Component Value Date   TSH 2.330 12/11/2022  03/09/24 CTAP  MPRESSION: 1. 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 No acute inflammatory changes. Surgical consultation and excision with pathologic analysis is recommended to exclude neoplasm. 2. Cholelithiasis without cholecystitis. 3. Distal colonic diverticulosis without diverticulitis. 4. L1 compression fracture. Please see preceding CT lumbar spine report for complete lumbar spine findings. 5.  Aortic Atherosclerosis (ICD10-I70.0).  Assessment: Encounter Diagnoses  Name Primary?   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 Seen by general surgery- No current indication for emergency surgery. Recommend  colonoscopy. Never had one. -Schedule for a colonoscopy in hospital in Dr. San. Pt uses oxygen as needed at home. The risks and benefits of colonoscopy with possible polypectomy / biopsies were discussed and the patient agrees to proceed.  -pending cardiac evaluation -->> echocardiogram and coronary CTA scheduled beginning of Jan for further evaluation.  --son has concerns about patient completing bowel prep at Assisted living facility, will schedule out to provide them time to prepare -send clearance to cardiology and pulmonology for procedures #2 Cholelithiasis without cholecystitis. Denies nausea, vomiting or RUQ pain. #3 Acute on chronic back pain/acute L1 compression fracture  -takes tramadol  prn #4 COPD. Uses oxygen at home as needed #5 Chronic diastolic heart failure: Echo in 10/2022 showed EF 60 to 65%,  -pending echo and CTA with cardiology  Thank you for the courtesy of this consult. Please call me with any questions or concerns.   Lasundra Hascall, FNP-C [facility] 04/06/2024, 4:51 PM  Cc: Macel Jayson PARAS, MD

## 2024-04-06 NOTE — Telephone Encounter (Signed)
 [facility] Pre-operative Risk Assessment     Request for surgical clearance:     Endoscopy Procedure  What type of surgery is being performed?     Endoscopic  When is this surgery scheduled?     05/26/24  What type of clearance is required ?   Pharmacy  Are there any medications that need to be held prior to surgery and how long? No, PLEASE advise clearance at next appointment  Practice name and name of physician performing surgery?      [facility]  What is your office phone and fax number?      Phone- [phone]  Fax- [phone]  Anesthesia type (None, local, MAC, general) ?       MAC   Please route your response to Karna Louder, CMA

## 2024-04-06 NOTE — Telephone Encounter (Signed)
   Name: Erica Ball  DOB: [birth year]  MRN: [id number]  Primary Cardiologist: Jerel Balding, MD  Chart reviewed as part of pre-operative protocol coverage. The patient has an upcoming visit scheduled with Katlyn West, NP on 05/14/2024 at which time clearance can be addressed in case there are any issues that would impact surgical recommendations.  Endoscopy procedure is not scheduled until 05/26/2024 as below. I added preop FYI to appointment note so that provider is aware to address at time of outpatient visit.  Per office protocol the cardiology provider should forward their finalized clearance decision and recommendations regarding antiplatelet therapy to the requesting party below.    Recommendations for input on holding aspirin  as requested below so that this information is available to the clearing provider at time of patient's appointment.   I will route this message as FYI to requesting party and remove this message from the preop box as separate preop APP input not needed at this time.   Please call with any questions.  Lamarr Satterfield, NP  04/06/2024, 4:14 PM

## 2024-04-06 NOTE — Patient Instructions (Signed)
 You have been scheduled for a colonoscopy. Please follow written instructions given to you at your visit today.   If you use inhalers (even only as needed), please bring them with you on the day of your procedure.  DO NOT TAKE 7 DAYS PRIOR TO TEST- Trulicity (dulaglutide) Ozempic, Wegovy (semaglutide) Mounjaro, Zepbound (tirzepatide) Bydureon Bcise (exanatide extended release)  DO NOT TAKE 1 DAY PRIOR TO YOUR TEST Rybelsus (semaglutide) Adlyxin (lixisenatide) Victoza (liraglutide) Byetta (exanatide) ___________________________________________________________________________  Due to recent changes in healthcare laws, you may see the results of your imaging and laboratory studies on MyChart before your provider has had a chance to review them.  We understand that in some cases there may be results that are confusing or concerning to you. Not all laboratory results come back in the same time frame and the provider may be waiting for multiple results in order to interpret others.  Please give us  48 hours in order for your provider to thoroughly review all the results before contacting the office for clarification of your results.   _______________________________________________________  If your blood pressure at your visit was 140/90 or greater, please contact your primary care physician to follow up on this.  _______________________________________________________  If you are age 75 or older, your body mass index should be between 23-30. Your There is no height or weight on file to calculate BMI. If this is out of the aforementioned range listed, please consider follow up with your Primary Care Provider.  If you are age 15 or younger, your body mass index should be between 19-25. Your There is no height or weight on file to calculate BMI. If this is out of the aformentioned range listed, please consider follow up with your Primary Care Provider.    ________________________________________________________  The [facility] providers would like to encourage you to use MYCHART to communicate with providers for non-urgent requests or questions.  Due to long hold times on the telephone, sending your provider a message by [facility] may be a faster and more efficient way to get a response.  Please allow 48 business hours for a response.  Please remember that this is for non-urgent requests.  _______________________________________________________  [facility] is using a team-based approach to care.  Your team is made up of your doctor and two to three APPS. Our APPS (Nurse Practitioners and Physician Assistants) work with your physician to ensure care continuity for you. They are fully qualified to address your health concerns and develop a treatment plan. They communicate directly with your gastroenterologist to care for you. Seeing the Advanced Practice Practitioners on your physician's team can help you by facilitating care more promptly, often allowing for earlier appointments, access to diagnostic testing, procedures, and other specialty referrals.   Thank you for trusting me with your gastrointestinal care. Deanna May, FNP-C

## 2024-04-09 ENCOUNTER — Other Ambulatory Visit (HOSPITAL_COMMUNITY): Payer: Self-pay

## 2024-04-11 ENCOUNTER — Ambulatory Visit: Payer: Self-pay

## 2024-04-13 NOTE — Progress Notes (Signed)
 Agree with the assessment and plan as outlined by [facility], FNP-C.  Agree with scheduling procedure to be done in the hospital endoscopy unit due to elevated periprocedural risks from underlying comorbidities, with cardiology and pulmonary clearance to proceed.  Tawona Filsinger, DO, [facility]

## 2024-04-14 ENCOUNTER — Encounter (HOSPITAL_COMMUNITY): Payer: Self-pay

## 2024-04-16 ENCOUNTER — Ambulatory Visit (HOSPITAL_COMMUNITY)
Admission: RE | Admit: 2024-04-16 | Discharge: 2024-04-16 | Disposition: A | Discharge status: Home / Self Care | Source: Ambulatory Visit | Attending: Internal Medicine | Admitting: Internal Medicine

## 2024-04-16 ENCOUNTER — Other Ambulatory Visit: Payer: Self-pay | Admitting: Cardiology

## 2024-04-16 ENCOUNTER — Ambulatory Visit (HOSPITAL_BASED_OUTPATIENT_CLINIC_OR_DEPARTMENT_OTHER)
Admission: RE | Admit: 2024-04-16 | Discharge: 2024-04-16 | Disposition: A | Discharge status: Home / Self Care | Source: Ambulatory Visit | Attending: Cardiology | Admitting: Cardiology

## 2024-04-16 ENCOUNTER — Other Ambulatory Visit (HOSPITAL_COMMUNITY): Payer: Self-pay

## 2024-04-16 DIAGNOSIS — Z01810 Encounter for preprocedural cardiovascular examination: Secondary | ICD-10-CM | POA: Insufficient documentation

## 2024-04-16 DIAGNOSIS — R931 Abnormal findings on diagnostic imaging of heart and coronary circulation: Secondary | ICD-10-CM

## 2024-04-16 DIAGNOSIS — I2510 Atherosclerotic heart disease of native coronary artery without angina pectoris: Secondary | ICD-10-CM | POA: Insufficient documentation

## 2024-04-16 DIAGNOSIS — R0609 Other forms of dyspnea: Secondary | ICD-10-CM | POA: Insufficient documentation

## 2024-04-16 MED ORDER — NITROGLYCERIN 0.4 MG SL SUBL
0.8000 mg | SUBLINGUAL_TABLET | Freq: Once | SUBLINGUAL | Status: AC
  Administered 2024-04-16: 0.8 mg via SUBLINGUAL

## 2024-04-16 MED ORDER — IOHEXOL 350 MG/ML SOLN
100.0000 mL | Freq: Once | INTRAVENOUS | Status: AC | PRN
  Administered 2024-04-16: 100 mL via INTRAVENOUS

## 2024-04-17 ENCOUNTER — Ambulatory Visit: Payer: Self-pay | Admitting: Cardiovascular Disease

## 2024-04-28 ENCOUNTER — Telehealth: Payer: Self-pay

## 2024-04-28 NOTE — Telephone Encounter (Signed)
" °  Erica Ball 04-21-1951 [id number]  04/28/2024    Dear Dipti Pleas, MD:  We have scheduled the above named patient for a(n) endoscopic procedure.   Please advise as to whether the patient may have pulmonary clearance to have procedure done which is scheduled for 05/26/24.  Please route your response to [employer] or fax response to [phone].  Sincerely,    [facility]   "

## 2024-04-28 NOTE — Telephone Encounter (Signed)
[  I had provided the preoperative clearance in my last clinic note as well. But she doesn't have any absolute pulmonary contraindications for endoscopy procedure- as noted below]  Pulmonary preoperative clearance.  Patient does not have absolute contraindication to proceed with anesthesia for elective surgery.  Patient doesn't have symptoms of active infection at this time and her copd has been stable over 12 months with no exacerbations. I educated them on the risk of postoperative pulmonary complications with anesthesia, especially with their chronic lung condition. Pt would be considered to have moderate risk for post operative pulmonary complications for non thoracic, non abdominal surgery based on available risk calculators.  would recommend following intra and post operative strategies to reduce risk for pulmonary complications- mostly post operative pneumonia and respiratory failure. 1. Limiting the duration of surgery as much as possible. 2. Complete reversal of neuromuscular blockade at the conclusion of the surgical procedure to avoid hypoventilation and post operative pulmonary complications 3. Use of post operative continuous positive airway pressure (BIPAP/CPAP) if surgery is prolonged and pt is not able to participate on lung expansion maneuvers like deep breathing exercises and incentive spirometry 4. Adequate Pain control and Early mobilization after surgery to facilitate deep breathing 5. Deep breathing exercises and incentive spirometry q 2 hrs post surgery Pt uses oxygen as needed at home.

## 2024-05-08 ENCOUNTER — Ambulatory Visit (HOSPITAL_COMMUNITY)
Admission: RE | Admit: 2024-05-08 | Discharge: 2024-05-08 | Disposition: A | Discharge status: Home / Self Care | Source: Ambulatory Visit | Attending: Cardiology | Admitting: Cardiology

## 2024-05-08 DIAGNOSIS — Z01810 Encounter for preprocedural cardiovascular examination: Secondary | ICD-10-CM | POA: Diagnosis present

## 2024-05-08 DIAGNOSIS — R0609 Other forms of dyspnea: Secondary | ICD-10-CM | POA: Diagnosis not present

## 2024-05-08 LAB — ECHOCARDIOGRAM COMPLETE
Area-P 1/2: 3.01 cm2
S' Lateral: 2.9 cm

## 2024-05-08 NOTE — Progress Notes (Signed)
 "  Cardiology Office Note    Date:  05/14/2024  ID:  Erica Ball, Erica Ball 10-18-1950, MRN [id number] PCP:  Macel Jayson PARAS, MD  Cardiologist:  Jerel Balding, MD  Electrophysiologist:  None   Chief Complaint: Follow up for CAD   History of Present Illness: Erica Ball is a 74 y.o. female with visit-pertinent history of hypertension, COPD, mobility issues, prior SVT in the setting of acute illness, EtOH abuse, tobacco abuse.   Patient previously saw Dr. Nishan in the outpatient setting in 2019 following admission for URI, COPD exacerbation and empyema requiring VATS, while in the hospital she had SVT with rates in the 180s that converted with IV metoprolol .  Echocardiogram was reassuring.  She also had troponin elevation suspected due to demand ischemia.  Subsequent stress test in 12/2017 was low risk, no ischemia identified, small defect of mild severity, nonreversible present in the apex location, EF 82% without wall motion abnormality.  It was previously entered into her online chart that she had a history of Wolff-Parkinson-White and remote discharge summary in 2016, this was not mentioned in prior cardiology notes.  Patient reported that her PCP was the first 1 to mention this many years ago and was previously referred out for evaluation and was told she did not have Wolff-Parkinson-White.   Patient was admitted in 2024 with a fall, reportedly was backing up to the sofa with her walker and missed the seat and fell on the carpet, laid there for a few hours and called her brother who assisted back the couch.  The next morning her foot and ankle were swollen and presented the ED.  She was found to have a left tibial plateau fracture, left ankle fracture, right metatarsal fractures.  Hospital course notable for acute encephalopathy, EtOH abuse requiring Librium  taper, subacute stroke seen by neuro recommending aspirin  81 mg daily and hyponatremia.  Lopressor  25 mg twice daily was started  early in admission for hypertension, discontinued on 10/29/2022 in setting of managing BP with hydralazine  and losartan .  In 10/2022 she had an episode of SVT from 330 until 10 AM associated with minimal troponin elevation without obvious chest pain.  She received 2 doses of IV metoprolol  and spontaneously converted. She had 1 brief recurrence of SVT on 10/31/2022 for which she received IV metoprolol . Echo showed EF 60 to 65%, grade 1 DD, mild LAE, mild to moderate AI. On the morning of 11/01/2022 she went back into narrow complex SVT with rates in the 160s with IV metoprolol  breaking the arrhythmia but soon went back into SVT with heart rates in the 140s to 150s. Plan to treat with escalating doses of oral beta-blocker until satisfactory control. It was felt increased burden likely driven by EtOH withdrawal and pain from injuries. She was eventually transition to Toprol -XL 100 mg daily. She was discharged to SNF on 11/05/2022.    Patient returned to ED on 11/16/2022 from [facility] SNF with hyponatremia, sodium 120.  Repeat sodium 119.  UA positive for pyuria.  Patient was given IV fluids and Rocephin  and was admitted.  Hyponatremia was persistent and nephrology was consulted.  Patient was started on urea .  Sodium subsequently improved to 135.  Patient was discharged back to SNF on 11/28/2022.   Patient returned to ED on 12/11/2022 for hyponatremia.  Sodium was 119.  Patient reported she had increased her sodium intake and was drinking 3 Gatorade's per day.  Per [facility] sent by SNF patient had not missed  any doses of urea .  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  Discharged on 12/18/2022 with instructions for 1.2 L fluid restriction, avoid Gatorade, continue urea , avoid Lasix .   Patient was last in clinic on 01/21/2023, 100 main stable from a cardiac standpoint.  Patient was being followed by nephrology for recurrent hyponatremia and was being referred to endocrinology.  She was receiving  IV fluids and taking urea  to keep her sodium levels increased.   On 01/18/2024 patient presented to ED with complaints of left-sided chest pain under the left breast.  Troponins were flat, BNP mildly elevated.  There is no evidence of PE.  It was noted that she had had some improvement with Tylenol  from her facility.  It was noted that her oxygen saturation remained low ranging from 79 to 84% on room air.  It was noted that her left-sided pain was pleuritic and worse with deep inspiration also reported worsening chronic cough and dyspnea.  Per notes patient had had a fall a few weeks prior, noted that her pain and breathing improved on steroids, antibiotics and nebulizer treatments was started on oral prednisone .  Patient was discharged back to SNF in stable condition.   Patient given return to the emergency department on 03/09/2024 with back pain.  CT lumbar spine showed subacute 45% compression fracture along the superior endplate at L1 with a 6 mm posterior bony retropulsion and mild sclerosis.  IR was consulted for kyphoplasty, completed on 11/14.  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  Patient denied any abdominal pain, nausea or vomiting.  Patient was last seen in clinic on 03/27/2024 for follow-up and preoperative cardiac evaluation.  Patient notes she was doing okay, reported ongoing back pain related to recent procedure and fall.  She denied any chest pain, increased lower extremity edema, orthopnea or PND.  She did note some stable dyspnea on exertion that had slightly progressed in the last year, notes she intermittently wears oxygen.  Patient was unable to achieve greater than 4 METS of activity.  Coronary CTA and echocardiogram were ordered for further evaluation.  Coronary CTA on 04/16/2024 indicated coronary calcium  score of 2013, 98th percentile.  Coronary CTA FFR analysis demonstrated no  hemodynamically flow-limiting lesions. Echocardiogram on 05/08/2024 indicated LVEF 60 to 65%, no RWMA, G1 DD, RV systolic function was normal, RV moderately enlarged, severely elevated PASP, trivial mitral valve regurgitation, no evidence of stenosis, aortic valve regurgitation mild, aortic valve sclerosis present without evidence of stenosis.  Today she presents for follow-up with her sister.  She reports that she has been doing well overall.  She denies any chest pain, reports that her breathing has been stable.  She notes occasional ankle edema that is well-controlled with torsemide.  She denies any orthopnea or PND.  She denies any palpitations, presyncope or syncope.  She resides at [facility] facility. She denies any cardiac concerns or complaints today.  ROS: .   Today she denies chest pain, fatigue, palpitations, melena, hematuria, hemoptysis, diaphoresis, weakness, presyncope, syncope, orthopnea, and PND.  All other systems are reviewed and otherwise negative. Studies Reviewed: Erica Ball   EKG:  EKG is not ordered today.  CV Studies: Cardiac studies reviewed are outlined and summarized above. Otherwise please see EMR for full report. Cardiac Studies & Procedures   ______________________________________________________________________________________________   STRESS TESTS  MYOCARDIAL PERFUSION IMAGING 12/12/2017  Interpretation Summary  Nuclear stress EF: 82%. No wall motion abnormalities.  There was no ST segment deviation noted during stress.  Defect 1: There is a small defect of mild severity, non reversible present in the apex location.  This is a low risk study. No ischemia identified.  Oneil Parchment, MD   ECHOCARDIOGRAM  ECHOCARDIOGRAM COMPLETE 05/08/2024  Narrative ECHOCARDIOGRAM REPORT    Patient Name:   Erica Ball Lukin Date of Exam: 05/08/2024 Medical Rec #:  [id number]         Height:       67.0 in Accession #:    [id number]        Weight:       180.0 lb Date of Birth:   1951/04/15        BSA:          1.934 m Patient Age:    73 years          BP:           130/72 mmHg Patient Gender: F                 HR:           72 bpm. Exam Location:  [address]  Procedure: 2D Echo, Color Doppler and Cardiac Doppler (Both Spectral and Color Flow Doppler were utilized during procedure).  Indications:    Z01.810 Pre-op R06.09 DOE  History:        Patient has prior history of Echocardiogram examinations, most recent 10/30/2022. COPD, Arrythmias:Tachycardia; Risk Factors:Hypertension.  Sonographer:    Powell Saras Referring Phys: [id number] Harold Mattes D Leela Vanbrocklin  IMPRESSIONS   1. Left ventricular ejection fraction, by estimation, is 60 to 65%. The left ventricle has normal function. The left ventricle has no regional wall motion abnormalities. Left ventricular diastolic parameters are consistent with Grade I diastolic dysfunction (impaired relaxation). 2. Right ventricular systolic function is normal. The right ventricular size is moderately enlarged. There is severely elevated pulmonary artery systolic pressure. 3. The mitral valve is degenerative. Trivial mitral valve regurgitation. No evidence of mitral stenosis. 4. Tricuspid valve regurgitation is moderate. 5. The aortic valve has an indeterminant number of cusps. Aortic valve regurgitation is mild. Aortic valve sclerosis/calcification is present, without any evidence of aortic stenosis. 6. The inferior vena cava is normal in size with greater than 50% respiratory variability, suggesting right atrial pressure of 3 mmHg.  Comparison(s): A prior study was performed on 10/30/2022. The RV size was normal without documented right ventricular systolic pressure. Aortic regurgitation was mild to moderate.  FINDINGS Left Ventricle: Left ventricular ejection fraction, by estimation, is 60 to 65%. The left ventricle has normal function. The left ventricle has no regional wall motion abnormalities. The left ventricular internal  cavity size was normal in size. There is no left ventricular hypertrophy. Left ventricular diastolic parameters are consistent with Grade I diastolic dysfunction (impaired relaxation).  Right Ventricle: The right ventricular size is moderately enlarged. No increase in right ventricular wall thickness. Right ventricular systolic function is normal. There is severely elevated pulmonary artery systolic pressure. The tricuspid regurgitant velocity is 4.06 m/s, and with an assumed right atrial pressure of 3 mmHg, the estimated right ventricular systolic pressure is 68.9 mmHg.  Left Atrium: Left atrial size was normal in size.  Right Atrium: Right atrial size was normal in size.  Pericardium: There is no evidence of pericardial effusion.  Mitral Valve: The mitral valve is degenerative in appearance. There is mild thickening of the mitral valve leaflet(s). Mild mitral annular calcification. Trivial mitral valve regurgitation. No evidence of  mitral valve stenosis.  Tricuspid Valve: The tricuspid valve is normal in structure. Tricuspid valve regurgitation is moderate . No evidence of tricuspid stenosis.  Aortic Valve: The aortic valve has an indeterminant number of cusps. Aortic valve regurgitation is mild. Aortic valve sclerosis/calcification is present, without any evidence of aortic stenosis.  Pulmonic Valve: The pulmonic valve was normal in structure. Pulmonic valve regurgitation is trivial. No evidence of pulmonic stenosis.  Aorta: The aortic root and ascending aorta are structurally normal, with no evidence of dilitation.  Venous: The inferior vena cava is normal in size with greater than 50% respiratory variability, suggesting right atrial pressure of 3 mmHg.  IAS/Shunts: No atrial level shunt detected by color flow Doppler.   LEFT VENTRICLE PLAX 2D LVIDd:         4.50 cm   Diastology LVIDs:         2.90 cm   LV e' medial:    6.81 cm/s LV PW:         0.70 cm   LV E/e' medial:  8.0 LV  IVS:        1.00 cm   LV e' lateral:   10.50 cm/s LVOT diam:     2.00 cm   LV E/e' lateral: 5.2 LV SV:         68 LV SV Index:   35 LVOT Area:     3.14 cm   RIGHT VENTRICLE             IVC RV Basal diam:  4.70 cm     IVC diam: 1.50 cm RV Mid diam:    3.90 cm RV S prime:     15.20 cm/s TAPSE (M-mode): 2.4 cm  LEFT ATRIUM             Index        RIGHT ATRIUM           Index LA diam:        3.20 cm 1.65 cm/m   RA Area:     16.20 cm LA Vol (A2C):   48.3 ml 24.98 ml/m  RA Volume:   42.90 ml  22.19 ml/m LA Vol (A4C):   36.2 ml 18.72 ml/m LA Biplane Vol: 44.5 ml 23.01 ml/m AORTIC VALVE LVOT Vmax:   108.00 cm/s LVOT Vmean:  70.600 cm/s LVOT VTI:    0.215 m  AORTA Ao Root diam: 3.50 cm Ao Asc diam:  3.00 cm  MITRAL VALVE               TRICUSPID VALVE MV Area (PHT): 3.01 cm    TR Peak grad:   65.9 mmHg MV Decel Time: 252 msec    TR Vmax:        406.00 cm/s MV E velocity: 54.50 cm/s MV A velocity: 97.20 cm/s  SHUNTS MV E/A ratio:  0.56        Systemic VTI:  0.22 m Systemic Diam: 2.00 cm  Erica Ball Electronically signed by Erica Ball Signature Date/Time: 05/08/2024/10:20:38 AM    Final      CT SCANS  CT CORONARY MORPH W/CTA COR W/SCORE 04/16/2024  Addendum 04/29/2024 11:17 PM ADDENDUM REPORT: 04/29/2024 23:15  EXAM: OVER-READ INTERPRETATION  CT CHEST  The following report is an over-read performed by radiologist Dr. Andrea Gasman of [facility] on 04/29/2024. This over-read does not include interpretation of cardiac or coronary anatomy or pathology. The coronary CTA interpretation by the cardiologist is attached.  COMPARISON:  Chest CTA  01/18/2024  FINDINGS: Vascular: Aortic atherosclerosis. The ascending aorta is mildly dilated at 4 cm. The descending aorta is tortuous.  Mediastinum/nodes: No mass. Unremarkable esophagus.  Lungs: Scattered areas of subsegmental atelectasis or scarring within the lower lobes. No pulmonary nodule. No  pleural fluid. The included airways are patent.  Upper abdomen: No acute or unexpected findings.  Musculoskeletal: There are no acute or suspicious osseous abnormalities.  IMPRESSION: 1. Mild dilatation of the ascending aorta at 4 cm. Recommend annual imaging followup by CTA or MRA. This recommendation follows 2010 ACCF/AHA/AATS/ACR/ASA/SCA/SCAI/SIR/STS/SVM Guidelines for the Diagnosis and Management of Patients with Thoracic Aortic Disease. Circulation. 2010; 121: Z733-z630. Aortic aneurysm NOS (ICD10-I71.9) 2.  Aortic Atherosclerosis (ICD10-I70.0).   Electronically Signed By: Andrea Gasman M.D. On: 04/29/2024 23:15  Narrative CLINICAL DATA:  Chest pain  EXAM: Cardiac/Coronary CTA  TECHNIQUE: A non-contrast, gated CT scan was obtained with axial slices of 3 mm through the heart for calcium  scoring. Calcium  scoring was performed using the Agatston method. A 120 kV prospective, gated, contrast cardiac scan was obtained. Gantry rotation speed was 250 msecs and collimation was 0.6 mm. Two sublingual nitroglycerin  tablets (0.8 mg) were given. The 3D data set was reconstructed in 5% intervals of the 35-75% of the R-R cycle. Diastolic phases were analyzed on a dedicated workstation using MPR, MIP, and VRT modes. The patient received 95 cc of contrast.  FINDINGS: Image quality: Excellent.  Noise artifact is: Limited.  Coronary Arteries:  Normal coronary origin.  Right dominance.  Left main: The left main is a large caliber vessel with a normal take off from the left coronary cusp that bifurcates to form a left anterior descending artery and a left circumflex artery. There is no plaque or stenosis.  Left anterior descending artery: The LAD gives off 2 patent diagonal branches. There is mild calcified plaque in the ostial/prox LAD and prox D1 (25-49%). There is mild to moderate mixed plaque in the mid LAD (25-49% but could be >50%).  Left circumflex artery: The LCX is  non-dominant and gives off a large bifurcating patent obtuse marginal branch. There is minimal calcified plaque in the prox/mid/distal LCx and prox/mid OM1 (<25%).  Right coronary artery: The RCA is dominant with normal take off from the right coronary cusp. There is mild to moderate mixed plaque in the prox/mid/distal RCA (25-49% but could be>50%). The RCA terminates as a PDA and right posterolateral branch without evidence of plaque or stenosis.  Right Atrium: Right atrial size is within normal limits.  Right Ventricle: The right ventricular cavity is within normal limits.  Left Atrium: Left atrial size is normal in size with no left atrial appendage filling defect.  Left Ventricle: The ventricular cavity size is within normal limits.  Pulmonary arteries: Normal in size.  Pulmonary veins: Normal pulmonary venous drainage.  Pericardium: Normal thickness without significant effusion or calcium  present.  Cardiac valves: The aortic valve is trileaflet without significant calcification. The mitral valve is normal without significant calcification.  Aorta: Mildly dilated ascending aorta measuring 3.8 x 4cm measured at the level of the main pulmonary artery bifurcation (double oblique) with scattered calcifications.  Extra-cardiac findings: See attached radiology report for non-cardiac structures.  IMPRESSION: IMPRESSION 1. Coronary calcium  score of 2013. This was 98th percentile for age-, sex, and race-matched controls.  2. Normal coronary origin with right dominance.  3. Mild to moderate atherosclerosis: 25-49% ostial/prox LAD and prox D1. 25-49% mid LAD but could be >50%. 25-49% prox/mid/distal RCA but could be >50% focally.  4. This study has been submitted for FFR analysis of the RCA.  RECOMMENDATIONS: 1. CAD-RADS 0: No evidence of CAD (0%). Consider non-atherosclerotic causes of chest pain.  2. CAD-RADS 1: Minimal non-obstructive CAD (0-24%).  Consider non-atherosclerotic causes of chest pain. Consider preventive therapy and risk factor modification.  3. CAD-RADS 2: Mild non-obstructive CAD (25-49%). Consider non-atherosclerotic causes of chest pain. Consider preventive therapy and risk factor modification.  4. CAD-RADS 3: Moderate stenosis. Consider symptom-guided anti-ischemic pharmacotherapy as well as risk factor modification per guideline directed care. Additional analysis with CT FFR will be submitted.  5. CAD-RADS 4: Severe stenosis. (70-99% or > 50% left main). Cardiac catheterization or CT FFR is recommended. Consider symptom-guided anti-ischemic pharmacotherapy as well as risk factor modification per guideline directed care. Invasive coronary angiography recommended with revascularization per published guideline statements.  6. CAD-RADS 5: Total coronary occlusion (100%). Consider cardiac catheterization or viability assessment. Consider symptom-guided anti-ischemic pharmacotherapy as well as risk factor modification per guideline directed care.  7. CAD-RADS N: Non-diagnostic study. Obstructive CAD can't be excluded. Alternative evaluation is recommended.  Wilbert Bihari, MD  Electronically Signed: By: Wilbert Bihari M.D. On: 04/16/2024 20:08     ______________________________________________________________________________________________       Current Reported Medications:.    Active Medications[1]  Physical Exam:    VS:  BP 128/64   Pulse 64   Ht 5' 8.5 (1.74 m)   Wt 161 lb 3.2 oz (73.1 kg)   SpO2 94%   BMI 24.15 kg/m    Wt Readings from Last 3 Encounters:  05/14/24 161 lb 3.2 oz (73.1 kg)  03/30/24 180 lb (81.6 kg)  03/27/24 180 lb (81.6 kg)    GEN: Well nourished, well developed in no acute distress NECK: No JVD; No carotid bruits CARDIAC: RRR, no murmurs, rubs, gallops RESPIRATORY:  Clear to auscultation without rales, wheezing or rhonchi  ABDOMEN: Soft, non-tender,  non-distended EXTREMITIES:  No edema; No acute deformity     Asessement and Plan:.    Preoperative cardiac evaluation: Patient currently pending colonoscopy on 05/26/2024, also previously received request for preoperative cardiac evaluation for robotic colon surgery. Erica Ball perioperative risk of a major cardiac event is 6.6% according to the Revised Cardiac Risk Index (RCRI).  Therefore, she is at moderate-high risk for perioperative complications.  Patient's coronary CTA showed nonobstructive CAD, this should not preclude planned procedures.  No further cardiovascular testing is needed, patient may proceed to surgery at acceptable risk. Antiplatelet and/or Anticoagulation Recommendations:Regarding ASA therapy, we recommend continuation of ASA throughout the perioperative period.  However, if the surgeon feels that cessation of ASA is required in the perioperative period, it may be stopped 5-7 days prior to surgery with a plan to resume it as soon as felt to be feasible from a surgical standpoint in the post-operative period.  Please ensure patient continues metoprolol  succinate 100 mg daily throughout the day of operation and subsequent recovery.   CAD: Coronary CTA on 04/16/2024 indicated coronary calcium  score of 2013, 98th percentile.  Coronary CTA FFR analysis demonstrated no hemodynamically flow-limiting lesions. Stable with no anginal symptoms. No indication for ischemic evaluation.  Heart healthy diet and regular cardiovascular exercise encouraged.  Reviewed ED precautions. Continue aspirin  81 mg daily, amlodipine  5 mg daily, hydralazine  25 mg 3 times daily, metoprolol  succinate 100 mg daily, Crestor  10 mg daily and torsemide 20 mg daily.  Hyperlipidemia: Last lipid profile in 2024 indicated LDL 64.  Check fasting lipid profile and LFTs today, LDL goal less than  70 given evidence of CAD.  Continue Crestor  10 mg daily pending lab work.  COPD: Patient reports that her breathing has been stable,  denies any significant changes.  Reviewed patient's echocardiogram indicating elevated PASP with Dr. Francyne, he agreed sounds likely related to history of COPD, recommended wearing oxygen more consistently.  He did not recommend further workup at this time, patient appears euvolemic and well compensated.  Recommended patient wear oxygen more consistently, she verbalized understanding.    SVT: Patient with history of SVT.  She denies any palpitations or feeling of irregular heartbeats.  Continue metoprolol  succinate 100 mg daily.  Chronic diastolic heart failure: Echo on 05/08/2024 indicated LVEF 66 5%, no RWMA, G1 DD.  Patient reports that her breathing has been stable, denies any orthopnea or PND.  She reports intermittent lower extremity edema that is well-controlled with torsemide.  She appears euvolemic and well compensated on exam.  Hypertension: Blood pressure today 128/64.  Continue current antihypertensive regimen.  Tobacco abuse: Patient with history of tobacco use, reports she discontinued use a year ago.  Congratulated.  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   Disposition: F/u with Dr. Francyne in six months or sooner if needed.   Signed, Oley Lahaie D Joleen Stuckert, NP       [1]  Current Meds  Medication Sig   acetaminophen  (TYLENOL ) 325 MG tablet Take 650 mg by mouth every 6 (six) hours as needed (general discomfort).   amLODipine  (NORVASC ) 5 MG tablet Take 5 mg by mouth daily.   aspirin  EC 81 MG tablet Take 1 tablet (81 mg total) by mouth daily.   bisacodyl  (DULCOLAX) 10 MG suppository Place 10 mg rectally daily as needed (constipation).   calcium  carbonate (OSCAL) 1500 (600 Ca) MG TABS tablet Take 600 mg of elemental calcium  by mouth daily.   cyanocobalamin  1000 MCG tablet Take 1 tablet (1,000 mcg total) by mouth daily.   Emollient (BAG BALM) OINT Apply 1 Application topically daily. Apply to lower legs   folic acid  (FOLVITE ) 1 MG tablet Take 1 tablet (1 mg total) by  mouth daily.   hydrALAZINE  (APRESOLINE ) 25 MG tablet Take 1 tablet (25 mg total) by mouth 3 (three) times daily.   magnesium  hydroxide (MILK OF MAGNESIA) 400 MG/5ML suspension Take 30 mLs by mouth daily as needed (no bm in 3 days).   magnesium  oxide (MAG-OX) 400 (240 Mg) MG tablet Take 1 tablet (400 mg total) by mouth daily.   meloxicam (MOBIC) 15 MG tablet Take 15 mg by mouth daily.   metoprolol  succinate (TOPROL -XL) 100 MG 24 hr tablet Take 1 tablet (100 mg total) by mouth in the morning. Take with or immediately following a meal.   ondansetron  (ZOFRAN ) 4 MG tablet Take 1 tablet (4 mg total) by mouth every 6 (six) hours as needed for nausea.   pantoprazole  (PROTONIX ) 40 MG tablet Take 1 tablet (40 mg total) by mouth daily.   polyethylene glycol (MIRALAX  / GLYCOLAX ) 17 g packet Take 17 g by mouth daily.   promethazine -dextromethorphan (PROMETHAZINE -DM) 6.25-15 MG/5ML syrup Take 5 mLs by mouth every 6 (six) hours as needed for cough.   rosuvastatin  (CRESTOR ) 10 MG tablet Take 1 tablet (10 mg total) by mouth daily.   senna-docusate (SENOKOT-S) 8.6-50 MG tablet Take 1 tablet by mouth 2 (two) times daily.   sodium chloride  1 g tablet Take 1-2 g by mouth See admin instructions. Take 2g by mouth in the morning and at bedtime. Take 1g in the afternoon  thiamine  (VITAMIN B-1) 100 MG tablet Take 1 tablet (100 mg total) by mouth daily.   torsemide (DEMADEX) 20 MG tablet Take 20 mg by mouth daily.   traMADol  (ULTRAM ) 50 MG tablet Take 1 tablet (50 mg total) by mouth every 6 (six) hours as needed (back pain).   [DISCONTINUED] albuterol  (VENTOLIN  HFA) 108 (90 Base) MCG/ACT inhaler Inhale 2 puffs into the lungs every 4 (four) hours as needed for shortness of breath or wheezing.   [DISCONTINUED] metoprolol  tartrate (LOPRESSOR ) 50 MG tablet Take 1 tablet (50 mg total) by mouth once for 1 dose. PLEASE TAKE METOPROLOL  2  HOURS PRIOR TO CTA SCAN.   "

## 2024-05-11 ENCOUNTER — Encounter (HOSPITAL_COMMUNITY): Payer: Self-pay | Admitting: Gastroenterology

## 2024-05-12 ENCOUNTER — Ambulatory Visit (HOSPITAL_COMMUNITY)
Admission: RE | Admit: 2024-05-12 | Discharge: 2024-05-12 | Disposition: A | Discharge status: Home / Self Care | Source: Ambulatory Visit

## 2024-05-12 DIAGNOSIS — R9389 Abnormal findings on diagnostic imaging of other specified body structures: Secondary | ICD-10-CM | POA: Diagnosis present

## 2024-05-14 ENCOUNTER — Encounter: Payer: Self-pay | Admitting: Cardiology

## 2024-05-14 ENCOUNTER — Ambulatory Visit: Attending: Cardiology | Admitting: Cardiology

## 2024-05-14 VITALS — BP 128/64 | HR 64 | Ht 68.5 in | Wt 161.2 lb

## 2024-05-14 DIAGNOSIS — R0609 Other forms of dyspnea: Secondary | ICD-10-CM | POA: Diagnosis not present

## 2024-05-14 DIAGNOSIS — Z79899 Other long term (current) drug therapy: Secondary | ICD-10-CM

## 2024-05-14 DIAGNOSIS — I2510 Atherosclerotic heart disease of native coronary artery without angina pectoris: Secondary | ICD-10-CM

## 2024-05-14 DIAGNOSIS — I10 Essential (primary) hypertension: Secondary | ICD-10-CM

## 2024-05-14 DIAGNOSIS — E222 Syndrome of inappropriate secretion of antidiuretic hormone: Secondary | ICD-10-CM | POA: Diagnosis not present

## 2024-05-14 DIAGNOSIS — Z01810 Encounter for preprocedural cardiovascular examination: Secondary | ICD-10-CM

## 2024-05-14 DIAGNOSIS — Z720 Tobacco use: Secondary | ICD-10-CM

## 2024-05-14 DIAGNOSIS — E782 Mixed hyperlipidemia: Secondary | ICD-10-CM

## 2024-05-14 LAB — HEPATIC FUNCTION PANEL
ALT: 9 IU/L (ref 0–32)
AST: 18 IU/L (ref 0–40)
Albumin: 3.9 g/dL (ref 3.8–4.8)
Alkaline Phosphatase: 107 IU/L (ref 49–135)
Bilirubin Total: 0.4 mg/dL (ref 0.0–1.2)
Bilirubin, Direct: 0.15 mg/dL (ref 0.00–0.40)
Total Protein: 6.5 g/dL (ref 6.0–8.5)

## 2024-05-14 LAB — LIPID PANEL
Chol/HDL Ratio: 2.6 ratio (ref 0.0–4.4)
Cholesterol, Total: 115 mg/dL (ref 100–199)
HDL: 44 mg/dL
LDL Chol Calc (NIH): 51 mg/dL (ref 0–99)
Triglycerides: 109 mg/dL (ref 0–149)
VLDL Cholesterol Cal: 20 mg/dL (ref 5–40)

## 2024-05-14 NOTE — Patient Instructions (Signed)
 Medication Instructions:  Your physician recommends that you continue on your current medications as directed. Please refer to the Current Medication list given to you today.  *If you need a refill on your cardiac medications before your next appointment, please call your pharmacy*  Lab Work: TODAY: Fasting Lipids, LFTs  If you have labs (blood work) drawn today and your tests are completely normal, you will receive your results only by: MyChart Message (if you have MyChart) OR A paper copy in the mail If you have any lab test that is abnormal or we need to change your treatment, we will call you to review the results.  Testing/Procedures: NONE  Follow-Up: At [facility], you and your health needs are our priority.  As part of our continuing mission to provide you with exceptional heart care, our providers are all part of one team.  This team includes your primary Cardiologist (physician) and Advanced Practice Providers or APPs (Physician Assistants and Nurse Practitioners) who all work together to provide you with the care you need, when you need it.  Your next appointment:   6 month(s)  Provider:   Jerel Balding, MD

## 2024-05-15 ENCOUNTER — Ambulatory Visit: Payer: Self-pay | Admitting: Cardiology

## 2024-05-15 ENCOUNTER — Telehealth: Payer: Self-pay | Admitting: Gastroenterology

## 2024-05-15 NOTE — Telephone Encounter (Signed)
 Attempted to follow-up with patient with both numbers on file unable to speak with patient.  Left voicemail.  Per notes patient has been cleared from cardiology and pulmonology.  Left voicemail with any questions or concerns about colonoscopy contact office.

## 2024-05-18 ENCOUNTER — Telehealth: Payer: Self-pay

## 2024-05-18 NOTE — Telephone Encounter (Signed)
 See telephone encounter.

## 2024-05-19 ENCOUNTER — Telehealth: Payer: Self-pay

## 2024-05-19 NOTE — Telephone Encounter (Signed)
 Procedure:COLON Procedure date: 05/26/24 Procedure location: [facility] Arrival Time: 6:33 Spoke with the patient Y/N: Y Any prep concerns? N Has the patient obtained the prep from the pharmacy ? Y Do you have a care partner and transportation: Y Any additional concerns? N

## 2024-05-22 NOTE — Progress Notes (Signed)
 Preop instructions for: Erica Ball     Date of Birth: February 15, 1951                Date of Procedure:  05/26/24 Procedure:  Colonoscopy  Surgeon: Dr. San Facility contact:  [facility]     Phone:  [phone] RN contact name/phone#:   same         and Fax #: [phone]      Transportation contact phone#: Facility Transportation Time to arrive at [facility]: 0630   Report to: Admitting - Go through main entrance of hospital and tell front desk you are having a procedure done, and they will show how to get to admitting dept.   **The patient will be clear liquids all day prior to procedure drink prep in the evening and also into morning of procedure at set times- full instructions and prep solution provided by GI Office, if do not have this info please call the GI office to be re-sent at [phone].**  May have the following until 0400 am day of procedure  CLEAR LIQUID DIET Water Black Coffee (sugar ok, NO MILK/CREAM OR CREAMERS)  Tea (sugar ok, NO MILK/CREAM OR CREAMERS) regular and decaf                             Plain Jell-O (NO RED)                                           Fruit ices (not with fruit pulp, NO RED)                                     Popsicles (NO RED)                                                                  Juice: apple, WHITE grape, WHITE cranberry Sports drinks like Gatorade (NO RED)   Take these morning medications only with sips of water.(or give through gastrostomy or feeding tube):   TAKE-Hydralazine , Metoprolol , Norvasc , Protonix   DO NOT TAKE- Hold Torsemide am of procedure   Note: No Insulin  or Diabetic meds should be given or taken the morning of the procedure!   Please send day of procedure: current med list and meds last taken that day, confirm nothing by mouth status from what time, Patient Demographic info( to include DNR status, problem list, allergies) Bring Insurance card and ID, can shower  & use deodorant but nothing else on skin. Leave all jewelry and other valuables at place where living (remove any metal).  Any questions prior to procedure call pre surg nurse Elenor [phone] Any questions DAY OF procedure, call ENDO DEPT. [phone]   Sent from: Elenor, RN-[facility]   [phone]

## 2024-05-26 ENCOUNTER — Other Ambulatory Visit (HOSPITAL_COMMUNITY): Payer: Self-pay

## 2024-05-26 ENCOUNTER — Encounter (HOSPITAL_COMMUNITY): Payer: Self-pay | Admitting: Certified Registered"

## 2024-05-26 ENCOUNTER — Other Ambulatory Visit: Payer: Self-pay

## 2024-05-26 ENCOUNTER — Encounter (HOSPITAL_COMMUNITY): Admission: RE | Payer: Self-pay | Source: Home / Self Care

## 2024-05-26 ENCOUNTER — Ambulatory Visit (HOSPITAL_COMMUNITY): Admission: RE | Admit: 2024-05-26 | Source: Home / Self Care | Admitting: Gastroenterology

## 2024-05-26 SURGERY — COLONOSCOPY
Anesthesia: Monitor Anesthesia Care

## 2024-05-26 MED ORDER — NA SULFATE-K SULFATE-MG SULF 17.5-3.13-1.6 GM/177ML PO SOLN: 1.0000 | Freq: Once | ORAL | 0 refills | Status: AC

## 2024-05-26 MED ORDER — NA SULFATE-K SULFATE-MG SULF 17.5-3.13-1.6 GM/177ML PO SOLN
1.0000 | Freq: Once | ORAL | 0 refills | Status: DC
  Filled 2024-05-26: qty 354, 2d supply, fill #0

## 2024-05-26 NOTE — Progress Notes (Signed)
 Patient did not show up for appointment today. Called and spoke with a nurse from [facility] facility that stated the patient was unable to drink the prep today and so is not coming to her appointment today. I told her they would need to contact. Dr. Rennis office regarding rescheduling. [phone]

## 2024-06-03 ENCOUNTER — Telehealth: Payer: Self-pay | Admitting: Gastroenterology

## 2024-06-03 NOTE — Telephone Encounter (Signed)
 Inbound call from [facility] stating that they are needing documentation that is patient is schedule at the [facility] for a colonoscopy on March the 2 nd. A good fax number for them is [phone] and call back number is  [phone]. Please advise.

## 2024-06-04 NOTE — Telephone Encounter (Signed)
 Colonoscopy prep instructions faxed to [phone].

## 2024-06-08 NOTE — Progress Notes (Signed)
 Pt son notified.

## 2024-07-06 ENCOUNTER — Ambulatory Visit (HOSPITAL_COMMUNITY): Admit: 2024-07-06 | Admitting: Gastroenterology

## 2024-07-06 ENCOUNTER — Encounter (HOSPITAL_COMMUNITY): Payer: Self-pay

## 2024-07-06 SURGERY — COLONOSCOPY
Anesthesia: Monitor Anesthesia Care

## 2024-07-21 ENCOUNTER — Ambulatory Visit
